# Patient Record
Sex: Male | Born: 1945 | Race: White | Hispanic: No | Smoking: Current every day smoker
Health system: Southern US, Community
[De-identification: ages and names within clinical notes are randomized; demographics above are authoritative.]

## PROBLEM LIST (undated history)

## (undated) DIAGNOSIS — K4090 Unilateral inguinal hernia, without obstruction or gangrene, not specified as recurrent: Secondary | ICD-10-CM

## (undated) DIAGNOSIS — D472 Monoclonal gammopathy: Secondary | ICD-10-CM

## (undated) DIAGNOSIS — I4510 Unspecified right bundle-branch block: Secondary | ICD-10-CM

## (undated) DIAGNOSIS — I779 Disorder of arteries and arterioles, unspecified: Secondary | ICD-10-CM

## (undated) DIAGNOSIS — N400 Enlarged prostate without lower urinary tract symptoms: Secondary | ICD-10-CM

## (undated) DIAGNOSIS — R7303 Prediabetes: Secondary | ICD-10-CM

## (undated) DIAGNOSIS — J9610 Chronic respiratory failure, unspecified whether with hypoxia or hypercapnia: Secondary | ICD-10-CM

## (undated) DIAGNOSIS — I499 Cardiac arrhythmia, unspecified: Secondary | ICD-10-CM

## (undated) DIAGNOSIS — I739 Peripheral vascular disease, unspecified: Secondary | ICD-10-CM

## (undated) DIAGNOSIS — D369 Benign neoplasm, unspecified site: Secondary | ICD-10-CM

## (undated) DIAGNOSIS — I509 Heart failure, unspecified: Secondary | ICD-10-CM

## (undated) DIAGNOSIS — F419 Anxiety disorder, unspecified: Secondary | ICD-10-CM

## (undated) DIAGNOSIS — I10 Essential (primary) hypertension: Secondary | ICD-10-CM

## (undated) DIAGNOSIS — K635 Polyp of colon: Secondary | ICD-10-CM

## (undated) DIAGNOSIS — R0989 Other specified symptoms and signs involving the circulatory and respiratory systems: Secondary | ICD-10-CM

## (undated) DIAGNOSIS — I2510 Atherosclerotic heart disease of native coronary artery without angina pectoris: Secondary | ICD-10-CM

## (undated) DIAGNOSIS — Z7982 Long term (current) use of aspirin: Secondary | ICD-10-CM

## (undated) DIAGNOSIS — Z7901 Long term (current) use of anticoagulants: Secondary | ICD-10-CM

## (undated) DIAGNOSIS — I700 Atherosclerosis of aorta: Secondary | ICD-10-CM

## (undated) DIAGNOSIS — M1990 Unspecified osteoarthritis, unspecified site: Secondary | ICD-10-CM

## (undated) DIAGNOSIS — Q211 Atrial septal defect, unspecified: Secondary | ICD-10-CM

## (undated) DIAGNOSIS — H409 Unspecified glaucoma: Secondary | ICD-10-CM

## (undated) DIAGNOSIS — I5020 Unspecified systolic (congestive) heart failure: Secondary | ICD-10-CM

## (undated) DIAGNOSIS — E785 Hyperlipidemia, unspecified: Secondary | ICD-10-CM

## (undated) DIAGNOSIS — R911 Solitary pulmonary nodule: Secondary | ICD-10-CM

## (undated) DIAGNOSIS — K551 Chronic vascular disorders of intestine: Secondary | ICD-10-CM

## (undated) DIAGNOSIS — C439 Malignant melanoma of skin, unspecified: Secondary | ICD-10-CM

## (undated) DIAGNOSIS — R011 Cardiac murmur, unspecified: Secondary | ICD-10-CM

## (undated) DIAGNOSIS — G8929 Other chronic pain: Secondary | ICD-10-CM

## (undated) DIAGNOSIS — K2950 Unspecified chronic gastritis without bleeding: Secondary | ICD-10-CM

## (undated) DIAGNOSIS — F32A Depression, unspecified: Secondary | ICD-10-CM

## (undated) DIAGNOSIS — Z9981 Dependence on supplemental oxygen: Secondary | ICD-10-CM

## (undated) DIAGNOSIS — M545 Low back pain, unspecified: Secondary | ICD-10-CM

## (undated) DIAGNOSIS — I219 Acute myocardial infarction, unspecified: Secondary | ICD-10-CM

## (undated) DIAGNOSIS — Z720 Tobacco use: Secondary | ICD-10-CM

## (undated) DIAGNOSIS — J449 Chronic obstructive pulmonary disease, unspecified: Secondary | ICD-10-CM

## (undated) DIAGNOSIS — R079 Chest pain, unspecified: Secondary | ICD-10-CM

## (undated) HISTORY — DX: Unspecified glaucoma: H40.9

## (undated) HISTORY — DX: Peripheral vascular disease, unspecified: I73.9

## (undated) HISTORY — DX: Polyp of colon: K63.5

## (undated) HISTORY — DX: Benign prostatic hyperplasia without lower urinary tract symptoms: N40.0

## (undated) HISTORY — PX: COLON SURGERY: SHX602

## (undated) HISTORY — DX: Heart failure, unspecified: I50.9

---

## 1997-12-28 ENCOUNTER — Encounter: Payer: Self-pay | Admitting: Neurosurgery

## 1997-12-28 ENCOUNTER — Ambulatory Visit (HOSPITAL_COMMUNITY): Admission: RE | Admit: 1997-12-28 | Discharge: 1997-12-28 | Payer: Self-pay | Admitting: Neurosurgery

## 1998-01-17 ENCOUNTER — Encounter: Payer: Self-pay | Admitting: Neurosurgery

## 1998-01-19 ENCOUNTER — Inpatient Hospital Stay (HOSPITAL_COMMUNITY): Admission: RE | Admit: 1998-01-19 | Discharge: 1998-01-20 | Payer: Self-pay | Admitting: Neurosurgery

## 1998-01-19 ENCOUNTER — Encounter: Payer: Self-pay | Admitting: Neurosurgery

## 1998-01-19 HISTORY — PX: BACK SURGERY: SHX140

## 1998-03-17 ENCOUNTER — Encounter: Payer: Self-pay | Admitting: Neurosurgery

## 1998-03-17 ENCOUNTER — Ambulatory Visit (HOSPITAL_COMMUNITY): Admission: RE | Admit: 1998-03-17 | Discharge: 1998-03-17 | Payer: Self-pay | Admitting: Neurosurgery

## 1999-04-23 ENCOUNTER — Ambulatory Visit (HOSPITAL_COMMUNITY): Admission: RE | Admit: 1999-04-23 | Discharge: 1999-04-23 | Payer: Self-pay | Admitting: Neurosurgery

## 1999-04-23 ENCOUNTER — Encounter: Payer: Self-pay | Admitting: Neurosurgery

## 2007-07-11 ENCOUNTER — Emergency Department: Payer: Self-pay | Admitting: Emergency Medicine

## 2011-03-26 HISTORY — PX: EYE SURGERY: SHX253

## 2012-08-15 ENCOUNTER — Ambulatory Visit: Payer: Self-pay

## 2012-09-17 ENCOUNTER — Other Ambulatory Visit: Payer: Self-pay | Admitting: Neurosurgery

## 2012-09-24 ENCOUNTER — Encounter (HOSPITAL_COMMUNITY): Payer: Self-pay | Admitting: Pharmacy Technician

## 2012-09-25 ENCOUNTER — Other Ambulatory Visit (HOSPITAL_COMMUNITY): Payer: Self-pay | Admitting: *Deleted

## 2012-09-25 NOTE — Pre-Procedure Instructions (Signed)
DELYNN PURSLEY  09/25/2012   Your procedure is scheduled on:  October 02, 2012 at 10:10 AM  Report to [facility] at 7:10 AM.  Call this number if you have problems the morning of surgery: [phone]   Remember:   Do not eat food or drink liquids after midnight.   Take these medicines the morning of surgery with A SIP OF WATER: ALPRAZolam Prudy Feeler), busPIRone (BUSPAR), HYDROcodone-acetaminophen (NORCO/VICODIN), traMADol (ULTRAM), brimonidine (ALPHAGAN)  Discontinue Aspirin, Coumadin, Plavix, Effient and herbal medication as of today.        Do not wear jewelry, make-up or nail polish.  Do not wear lotions, powders, or perfumes. You may wear deodorant.  Do not shave 48 hours prior to surgery. Men may shave face and neck.  Do not bring valuables to the hospital.  [facility] is not responsible                   for any belongings or valuables.  Contacts, dentures or bridgework may not be worn into surgery.  Leave suitcase in the car. After surgery it may be brought to your room.  For patients admitted to the hospital, checkout time is 11:00 AM the day of  discharge.    Special Instructions: Shower using CHG 2 nights before surgery and the night before surgery.  If you shower the day of surgery use CHG.  Use special wash - you have one bottle of CHG for all showers.  You should use approximately 1/3 of the bottle for each shower.   Please read over the following fact sheets that you were given: Pain Booklet, Coughing and Deep Breathing, MRSA Information and Surgical Site Infection Prevention

## 2012-09-26 ENCOUNTER — Encounter (HOSPITAL_COMMUNITY)
Admission: RE | Admit: 2012-09-26 | Discharge: 2012-09-26 | Disposition: A | Discharge status: Home / Self Care | Payer: Medicare Other | Source: Ambulatory Visit | Attending: Anesthesiology | Admitting: Anesthesiology

## 2012-09-26 ENCOUNTER — Encounter (HOSPITAL_COMMUNITY)
Admission: RE | Admit: 2012-09-26 | Discharge: 2012-09-26 | Disposition: A | Discharge status: Home / Self Care | Payer: Medicare Other | Source: Ambulatory Visit | Attending: Neurosurgery | Admitting: Neurosurgery

## 2012-09-26 ENCOUNTER — Encounter (HOSPITAL_COMMUNITY): Payer: Self-pay

## 2012-09-26 DIAGNOSIS — Z01810 Encounter for preprocedural cardiovascular examination: Secondary | ICD-10-CM | POA: Insufficient documentation

## 2012-09-26 DIAGNOSIS — Z01818 Encounter for other preprocedural examination: Secondary | ICD-10-CM | POA: Insufficient documentation

## 2012-09-26 DIAGNOSIS — Z01812 Encounter for preprocedural laboratory examination: Secondary | ICD-10-CM | POA: Insufficient documentation

## 2012-09-26 HISTORY — DX: Essential (primary) hypertension: I10

## 2012-09-26 HISTORY — DX: Anxiety disorder, unspecified: F41.9

## 2012-09-26 HISTORY — DX: Chronic obstructive pulmonary disease, unspecified: J44.9

## 2012-09-26 HISTORY — DX: Unspecified osteoarthritis, unspecified site: M19.90

## 2012-09-26 LAB — BASIC METABOLIC PANEL
BUN: 10 mg/dL (ref 6–23)
CO2: 25 mEq/L (ref 19–32)
Chloride: 92 mEq/L — ABNORMAL LOW (ref 96–112)
Creatinine, Ser: 0.86 mg/dL (ref 0.50–1.35)
Glucose, Bld: 114 mg/dL — ABNORMAL HIGH (ref 70–99)

## 2012-09-26 LAB — CBC
HCT: 35.8 % — ABNORMAL LOW (ref 39.0–52.0)
MCH: 31.7 pg (ref 26.0–34.0)
MCHC: 36.3 g/dL — ABNORMAL HIGH (ref 30.0–36.0)
MCV: 87.3 fL (ref 78.0–100.0)
RDW: 13.5 % (ref 11.5–15.5)

## 2012-09-26 LAB — SURGICAL PCR SCREEN
MRSA, PCR: NEGATIVE
Staphylococcus aureus: NEGATIVE

## 2012-10-01 MED ORDER — CEFAZOLIN SODIUM-DEXTROSE 2-3 GM-% IV SOLR
2.0000 g | INTRAVENOUS | Status: AC
  Administered 2012-10-02: 2 g via INTRAVENOUS
  Filled 2012-10-01: qty 50

## 2012-10-02 ENCOUNTER — Encounter (HOSPITAL_COMMUNITY): Payer: Self-pay | Admitting: *Deleted

## 2012-10-02 ENCOUNTER — Encounter (HOSPITAL_COMMUNITY)
Admission: RE | Disposition: A | Discharge status: Home / Self Care | Payer: Self-pay | Source: Ambulatory Visit | Attending: Neurosurgery

## 2012-10-02 ENCOUNTER — Observation Stay (HOSPITAL_COMMUNITY): Payer: Medicare Other

## 2012-10-02 ENCOUNTER — Observation Stay (HOSPITAL_COMMUNITY)
Admission: RE | Admit: 2012-10-02 | Discharge: 2012-10-03 | Disposition: A | Discharge status: Home / Self Care | Payer: Medicare Other | Source: Ambulatory Visit | Attending: Neurosurgery | Admitting: Neurosurgery

## 2012-10-02 ENCOUNTER — Ambulatory Visit (HOSPITAL_COMMUNITY): Payer: Medicare Other | Admitting: Certified Registered Nurse Anesthetist

## 2012-10-02 ENCOUNTER — Encounter (HOSPITAL_COMMUNITY): Payer: Self-pay | Admitting: Certified Registered Nurse Anesthetist

## 2012-10-02 DIAGNOSIS — F411 Generalized anxiety disorder: Secondary | ICD-10-CM | POA: Insufficient documentation

## 2012-10-02 DIAGNOSIS — J449 Chronic obstructive pulmonary disease, unspecified: Secondary | ICD-10-CM | POA: Insufficient documentation

## 2012-10-02 DIAGNOSIS — J4489 Other specified chronic obstructive pulmonary disease: Secondary | ICD-10-CM | POA: Insufficient documentation

## 2012-10-02 DIAGNOSIS — M47817 Spondylosis without myelopathy or radiculopathy, lumbosacral region: Secondary | ICD-10-CM | POA: Insufficient documentation

## 2012-10-02 DIAGNOSIS — I10 Essential (primary) hypertension: Secondary | ICD-10-CM | POA: Insufficient documentation

## 2012-10-02 DIAGNOSIS — M5137 Other intervertebral disc degeneration, lumbosacral region: Secondary | ICD-10-CM | POA: Insufficient documentation

## 2012-10-02 DIAGNOSIS — M5126 Other intervertebral disc displacement, lumbar region: Principal | ICD-10-CM | POA: Insufficient documentation

## 2012-10-02 DIAGNOSIS — F17200 Nicotine dependence, unspecified, uncomplicated: Secondary | ICD-10-CM | POA: Insufficient documentation

## 2012-10-02 DIAGNOSIS — M51379 Other intervertebral disc degeneration, lumbosacral region without mention of lumbar back pain or lower extremity pain: Secondary | ICD-10-CM | POA: Insufficient documentation

## 2012-10-02 HISTORY — PX: LUMBAR LAMINECTOMY/DECOMPRESSION MICRODISCECTOMY: SHX5026

## 2012-10-02 SURGERY — LUMBAR LAMINECTOMY/DECOMPRESSION MICRODISCECTOMY 1 LEVEL
Anesthesia: General | Laterality: Right | Wound class: Clean

## 2012-10-02 MED ORDER — PHENOL 1.4 % MT LIQD: 1.0000 | OROMUCOSAL | Status: DC | PRN

## 2012-10-02 MED ORDER — ACETAMINOPHEN 10 MG/ML IV SOLN
INTRAVENOUS | Status: AC
  Administered 2012-10-02: 1000 mg via INTRAVENOUS
  Filled 2012-10-02: qty 100

## 2012-10-02 MED ORDER — SODIUM CHLORIDE 0.9 % IV SOLN: INTRAVENOUS | Status: DC

## 2012-10-02 MED ORDER — ONDANSETRON HCL 4 MG/2ML IJ SOLN: 4.0000 mg | Freq: Four times a day (QID) | INTRAMUSCULAR | Status: DC | PRN

## 2012-10-02 MED ORDER — PROPOFOL 10 MG/ML IV BOLUS
INTRAVENOUS | Status: DC | PRN
  Administered 2012-10-02: 120 mg via INTRAVENOUS

## 2012-10-02 MED ORDER — HYDROMORPHONE HCL PF 1 MG/ML IJ SOLN
0.2500 mg | INTRAMUSCULAR | Status: DC | PRN
  Administered 2012-10-02 (×3): 0.5 mg via INTRAVENOUS

## 2012-10-02 MED ORDER — LACTATED RINGERS IV SOLN
INTRAVENOUS | Status: DC | PRN
  Administered 2012-10-02: 07:00:00 via INTRAVENOUS

## 2012-10-02 MED ORDER — SODIUM CHLORIDE 0.9 % IJ SOLN: 3.0000 mL | INTRAMUSCULAR | Status: DC | PRN

## 2012-10-02 MED ORDER — TAMSULOSIN HCL 0.4 MG PO CAPS
0.8000 mg | ORAL_CAPSULE | ORAL | Status: AC
  Administered 2012-10-02: 0.8 mg via ORAL
  Filled 2012-10-02: qty 2

## 2012-10-02 MED ORDER — MIDAZOLAM HCL 5 MG/5ML IJ SOLN
INTRAMUSCULAR | Status: DC | PRN
  Administered 2012-10-02: 2 mg via INTRAVENOUS

## 2012-10-02 MED ORDER — ARTIFICIAL TEARS OP OINT
TOPICAL_OINTMENT | OPHTHALMIC | Status: DC | PRN
  Administered 2012-10-02: 1 via OPHTHALMIC

## 2012-10-02 MED ORDER — KETOROLAC TROMETHAMINE 30 MG/ML IJ SOLN
30.0000 mg | Freq: Four times a day (QID) | INTRAMUSCULAR | Status: DC
  Administered 2012-10-02 – 2012-10-03 (×3): 30 mg via INTRAVENOUS
  Filled 2012-10-02 (×7): qty 1

## 2012-10-02 MED ORDER — PHENYLEPHRINE HCL 10 MG/ML IJ SOLN
INTRAMUSCULAR | Status: DC | PRN
  Administered 2012-10-02: 40 ug via INTRAVENOUS
  Administered 2012-10-02 (×2): 80 ug via INTRAVENOUS

## 2012-10-02 MED ORDER — DEXAMETHASONE SODIUM PHOSPHATE 10 MG/ML IJ SOLN
INTRAMUSCULAR | Status: AC
  Filled 2012-10-02: qty 1

## 2012-10-02 MED ORDER — ONDANSETRON HCL 4 MG/2ML IJ SOLN
INTRAMUSCULAR | Status: DC | PRN
  Administered 2012-10-02: 4 mg via INTRAVENOUS

## 2012-10-02 MED ORDER — SODIUM CHLORIDE 0.9 % IR SOLN
Status: DC | PRN
  Administered 2012-10-02: 10:00:00

## 2012-10-02 MED ORDER — KETOROLAC TROMETHAMINE 30 MG/ML IJ SOLN
30.0000 mg | Freq: Once | INTRAMUSCULAR | Status: DC
  Administered 2012-10-02: 30 mg via INTRAVENOUS

## 2012-10-02 MED ORDER — LACTATED RINGERS IV SOLN
INTRAVENOUS | Status: DC
  Administered 2012-10-02: 07:00:00 via INTRAVENOUS

## 2012-10-02 MED ORDER — HYDROXYZINE HCL 25 MG PO TABS: 50.0000 mg | ORAL_TABLET | ORAL | Status: DC | PRN

## 2012-10-02 MED ORDER — FENTANYL CITRATE 0.05 MG/ML IJ SOLN
INTRAMUSCULAR | Status: DC | PRN
  Administered 2012-10-02: 100 ug via INTRAVENOUS

## 2012-10-02 MED ORDER — BUSPIRONE HCL 15 MG PO TABS
30.0000 mg | ORAL_TABLET | Freq: Two times a day (BID) | ORAL | Status: DC
  Administered 2012-10-02: 30 mg via ORAL
  Filled 2012-10-02 (×3): qty 2

## 2012-10-02 MED ORDER — GLYCOPYRROLATE 0.2 MG/ML IJ SOLN
INTRAMUSCULAR | Status: DC | PRN
  Administered 2012-10-02: .8 mg via INTRAVENOUS

## 2012-10-02 MED ORDER — 0.9 % SODIUM CHLORIDE (POUR BTL) OPTIME
TOPICAL | Status: DC | PRN
  Administered 2012-10-02: 1000 mL

## 2012-10-02 MED ORDER — BUPIVACAINE HCL (PF) 0.5 % IJ SOLN
INTRAMUSCULAR | Status: DC | PRN
  Administered 2012-10-02: 5 mL

## 2012-10-02 MED ORDER — EPHEDRINE SULFATE 50 MG/ML IJ SOLN
INTRAMUSCULAR | Status: DC | PRN
  Administered 2012-10-02: 2.5 mg via INTRAVENOUS
  Administered 2012-10-02: 5 mg via INTRAVENOUS

## 2012-10-02 MED ORDER — DEXAMETHASONE SODIUM PHOSPHATE 10 MG/ML IJ SOLN
INTRAMUSCULAR | Status: DC | PRN
  Administered 2012-10-02: 10 mg via INTRAVENOUS

## 2012-10-02 MED ORDER — SODIUM CHLORIDE 0.9 % IJ SOLN
3.0000 mL | Freq: Two times a day (BID) | INTRAMUSCULAR | Status: DC
  Administered 2012-10-02: 3 mL via INTRAVENOUS

## 2012-10-02 MED ORDER — FENTANYL CITRATE 0.05 MG/ML IJ SOLN
INTRAMUSCULAR | Status: AC
  Filled 2012-10-02: qty 2

## 2012-10-02 MED ORDER — ALUM & MAG HYDROXIDE-SIMETH 200-200-20 MG/5ML PO SUSP: 30.0000 mL | Freq: Four times a day (QID) | ORAL | Status: DC | PRN

## 2012-10-02 MED ORDER — LIDOCAINE-EPINEPHRINE 1 %-1:100000 IJ SOLN
INTRAMUSCULAR | Status: DC | PRN
  Administered 2012-10-02: 5 mL via INTRADERMAL

## 2012-10-02 MED ORDER — LIDOCAINE HCL (CARDIAC) 20 MG/ML IV SOLN
INTRAVENOUS | Status: DC | PRN
  Administered 2012-10-02: 65 mg via INTRAVENOUS

## 2012-10-02 MED ORDER — FENTANYL CITRATE 0.05 MG/ML IJ SOLN
INTRAMUSCULAR | Status: DC | PRN
  Administered 2012-10-02: 150 ug via INTRAVENOUS

## 2012-10-02 MED ORDER — MORPHINE SULFATE 4 MG/ML IJ SOLN
4.0000 mg | INTRAMUSCULAR | Status: DC | PRN
  Administered 2012-10-02 (×2): 4 mg via INTRAMUSCULAR
  Filled 2012-10-02 (×2): qty 1

## 2012-10-02 MED ORDER — OXYCODONE HCL 5 MG PO TABS
ORAL_TABLET | ORAL | Status: AC
  Filled 2012-10-02: qty 1

## 2012-10-02 MED ORDER — BUSPIRONE HCL 15 MG PO TABS: 30.0000 mg | ORAL_TABLET | Freq: Two times a day (BID) | ORAL | Status: DC

## 2012-10-02 MED ORDER — ONDANSETRON HCL 4 MG/2ML IJ SOLN: 4.0000 mg | Freq: Once | INTRAMUSCULAR | Status: DC | PRN

## 2012-10-02 MED ORDER — HYDROMORPHONE HCL PF 1 MG/ML IJ SOLN
INTRAMUSCULAR | Status: AC
  Administered 2012-10-02: 0.5 mg via INTRAVENOUS
  Filled 2012-10-02: qty 1

## 2012-10-02 MED ORDER — METHYLPREDNISOLONE ACETATE 80 MG/ML IJ SUSP
INTRAMUSCULAR | Status: DC | PRN
  Administered 2012-10-02: 80 mg via INTRALESIONAL

## 2012-10-02 MED ORDER — BISACODYL 10 MG RE SUPP: 10.0000 mg | Freq: Every day | RECTAL | Status: DC | PRN

## 2012-10-02 MED ORDER — ALPRAZOLAM 0.5 MG PO TABS
1.0000 mg | ORAL_TABLET | Freq: Three times a day (TID) | ORAL | Status: DC | PRN
  Administered 2012-10-02 – 2012-10-03 (×2): 1 mg via ORAL
  Filled 2012-10-02 (×2): qty 2

## 2012-10-02 MED ORDER — OXYCODONE HCL 5 MG/5ML PO SOLN: 5.0000 mg | Freq: Once | ORAL | Status: AC | PRN

## 2012-10-02 MED ORDER — KETOROLAC TROMETHAMINE 30 MG/ML IJ SOLN
INTRAMUSCULAR | Status: AC
  Administered 2012-10-02: 30 mg via INTRAVENOUS
  Filled 2012-10-02: qty 1

## 2012-10-02 MED ORDER — MAGNESIUM HYDROXIDE 400 MG/5ML PO SUSP: 30.0000 mL | Freq: Every day | ORAL | Status: DC | PRN

## 2012-10-02 MED ORDER — NEOSTIGMINE METHYLSULFATE 1 MG/ML IJ SOLN
INTRAMUSCULAR | Status: DC | PRN
  Administered 2012-10-02: 5 mg via INTRAVENOUS

## 2012-10-02 MED ORDER — LATANOPROST 0.005 % OP SOLN
1.0000 [drp] | Freq: Every day | OPHTHALMIC | Status: DC
  Administered 2012-10-03: 1 [drp] via OPHTHALMIC
  Filled 2012-10-02 (×2): qty 2.5

## 2012-10-02 MED ORDER — ALPRAZOLAM 1 MG PO TABS: 1.0000 mg | ORAL_TABLET | Freq: Three times a day (TID) | ORAL | Status: DC | PRN

## 2012-10-02 MED ORDER — BRIMONIDINE TARTRATE 0.2 % OP SOLN
1.0000 [drp] | Freq: Two times a day (BID) | OPHTHALMIC | Status: DC
  Administered 2012-10-02: 1 [drp] via OPHTHALMIC
  Filled 2012-10-02: qty 5

## 2012-10-02 MED ORDER — CYCLOBENZAPRINE HCL 10 MG PO TABS
10.0000 mg | ORAL_TABLET | Freq: Three times a day (TID) | ORAL | Status: DC | PRN
  Administered 2012-10-02: 10 mg via ORAL
  Filled 2012-10-02: qty 1

## 2012-10-02 MED ORDER — SODIUM CHLORIDE 0.9 % IV SOLN: 250.0000 mL | INTRAVENOUS | Status: DC

## 2012-10-02 MED ORDER — MENTHOL 3 MG MT LOZG: 1.0000 | LOZENGE | OROMUCOSAL | Status: DC | PRN

## 2012-10-02 MED ORDER — ACETAMINOPHEN 325 MG PO TABS: 650.0000 mg | ORAL_TABLET | ORAL | Status: DC | PRN

## 2012-10-02 MED ORDER — HYDROCODONE-ACETAMINOPHEN 5-325 MG PO TABS: 1.0000 | ORAL_TABLET | ORAL | Status: DC | PRN

## 2012-10-02 MED ORDER — ROCURONIUM BROMIDE 100 MG/10ML IV SOLN
INTRAVENOUS | Status: DC | PRN
  Administered 2012-10-02: 50 mg via INTRAVENOUS
  Administered 2012-10-02: 5 mg via INTRAVENOUS

## 2012-10-02 MED ORDER — THROMBIN 5000 UNITS EX SOLR
CUTANEOUS | Status: DC | PRN
  Administered 2012-10-02 (×2): 5000 [IU] via TOPICAL

## 2012-10-02 MED ORDER — HYDROMORPHONE HCL PF 1 MG/ML IJ SOLN
INTRAMUSCULAR | Status: AC
  Filled 2012-10-02: qty 1

## 2012-10-02 MED ORDER — OXYCODONE HCL 5 MG PO TABS
5.0000 mg | ORAL_TABLET | Freq: Once | ORAL | Status: AC | PRN
  Administered 2012-10-02: 5 mg via ORAL

## 2012-10-02 MED ORDER — MEPERIDINE HCL 25 MG/ML IJ SOLN: 6.2500 mg | INTRAMUSCULAR | Status: DC | PRN

## 2012-10-02 MED ORDER — OXYCODONE-ACETAMINOPHEN 5-325 MG PO TABS
1.0000 | ORAL_TABLET | ORAL | Status: DC | PRN
  Administered 2012-10-02 – 2012-10-03 (×2): 2 via ORAL
  Filled 2012-10-02 (×2): qty 2

## 2012-10-02 MED ORDER — BRIMONIDINE TARTRATE 0.15 % OP SOLN
1.0000 [drp] | Freq: Two times a day (BID) | OPHTHALMIC | Status: DC
  Filled 2012-10-02: qty 5

## 2012-10-02 MED ORDER — ACETAMINOPHEN 650 MG RE SUPP: 650.0000 mg | RECTAL | Status: DC | PRN

## 2012-10-02 MED ORDER — TAMSULOSIN HCL 0.4 MG PO CAPS
0.4000 mg | ORAL_CAPSULE | Freq: Every day | ORAL | Status: DC
  Administered 2012-10-03: 0.4 mg via ORAL
  Filled 2012-10-02 (×2): qty 1

## 2012-10-02 SURGICAL SUPPLY — 67 items
ADH SKN CLS APL DERMABOND .7 (GAUZE/BANDAGES/DRESSINGS) ×1
APL SKNCLS STERI-STRIP NONHPOA (GAUZE/BANDAGES/DRESSINGS)
BAG DECANTER FOR FLEXI CONT (MISCELLANEOUS) ×2 IMPLANT
BENZOIN TINCTURE PRP APPL 2/3 (GAUZE/BANDAGES/DRESSINGS) IMPLANT
BLADE SURG ROTATE 9660 (MISCELLANEOUS) IMPLANT
BRUSH SCRUB EZ PLAIN DRY (MISCELLANEOUS) ×2 IMPLANT
BUR ACORN 6.0 ACORN (BURR) IMPLANT
BUR ACRON 5.0MM COATED (BURR) ×1 IMPLANT
BUR MATCHSTICK NEURO 3.0 LAGG (BURR) ×2 IMPLANT
CANISTER SUCTION 2500CC (MISCELLANEOUS) ×2 IMPLANT
CLOTH BEACON ORANGE TIMEOUT ST (SAFETY) ×2 IMPLANT
CONT SPEC 4OZ CLIKSEAL STRL BL (MISCELLANEOUS) ×1 IMPLANT
DERMABOND ADVANCED (GAUZE/BANDAGES/DRESSINGS) ×1
DERMABOND ADVANCED .7 DNX12 (GAUZE/BANDAGES/DRESSINGS) IMPLANT
DRAPE LAPAROTOMY 100X72X124 (DRAPES) ×2 IMPLANT
DRAPE MICROSCOPE LEICA (MISCELLANEOUS) ×2 IMPLANT
DRAPE POUCH INSTRU U-SHP 10X18 (DRAPES) ×2 IMPLANT
DRAPE PROXIMA HALF (DRAPES) ×1 IMPLANT
DRSG EMULSION OIL 3X3 NADH (GAUZE/BANDAGES/DRESSINGS) IMPLANT
ELECT REM PT RETURN 9FT ADLT (ELECTROSURGICAL) ×2
ELECTRODE REM PT RTRN 9FT ADLT (ELECTROSURGICAL) ×1 IMPLANT
GAUZE SPONGE 4X4 16PLY XRAY LF (GAUZE/BANDAGES/DRESSINGS) IMPLANT
GLOVE BIO SURGEON STRL SZ8 (GLOVE) ×1 IMPLANT
GLOVE BIOGEL PI IND STRL 7.5 (GLOVE) IMPLANT
GLOVE BIOGEL PI IND STRL 8 (GLOVE) ×1 IMPLANT
GLOVE BIOGEL PI IND STRL 8.5 (GLOVE) IMPLANT
GLOVE BIOGEL PI INDICATOR 7.5 (GLOVE) ×1
GLOVE BIOGEL PI INDICATOR 8 (GLOVE) ×3
GLOVE BIOGEL PI INDICATOR 8.5 (GLOVE) ×1
GLOVE ECLIPSE 7.5 STRL STRAW (GLOVE) ×2 IMPLANT
GLOVE ECLIPSE 8.0 STRL XLNG CF (GLOVE) ×1 IMPLANT
GLOVE EXAM NITRILE LRG STRL (GLOVE) IMPLANT
GLOVE EXAM NITRILE MD LF STRL (GLOVE) IMPLANT
GLOVE EXAM NITRILE XL STR (GLOVE) IMPLANT
GLOVE EXAM NITRILE XS STR PU (GLOVE) IMPLANT
GLOVE SURG SS PI 7.0 STRL IVOR (GLOVE) ×2 IMPLANT
GOWN BRE IMP SLV AUR LG STRL (GOWN DISPOSABLE) ×2 IMPLANT
GOWN BRE IMP SLV AUR XL STRL (GOWN DISPOSABLE) ×2 IMPLANT
GOWN STRL REIN 2XL LVL4 (GOWN DISPOSABLE) ×1 IMPLANT
KIT BASIN OR (CUSTOM PROCEDURE TRAY) ×2 IMPLANT
KIT ROOM TURNOVER OR (KITS) ×2 IMPLANT
NDL HYPO 18GX1.5 BLUNT FILL (NEEDLE) IMPLANT
NDL SPNL 18GX3.5 QUINCKE PK (NEEDLE) ×1 IMPLANT
NDL SPNL 22GX3.5 QUINCKE BK (NEEDLE) ×1 IMPLANT
NEEDLE HYPO 18GX1.5 BLUNT FILL (NEEDLE) IMPLANT
NEEDLE SPNL 18GX3.5 QUINCKE PK (NEEDLE) ×2 IMPLANT
NEEDLE SPNL 22GX3.5 QUINCKE BK (NEEDLE) ×2 IMPLANT
NS IRRIG 1000ML POUR BTL (IV SOLUTION) ×2 IMPLANT
PACK LAMINECTOMY NEURO (CUSTOM PROCEDURE TRAY) ×2 IMPLANT
PAD ARMBOARD 7.5X6 YLW CONV (MISCELLANEOUS) ×6 IMPLANT
PATTIES SURGICAL .5 X1 (DISPOSABLE) ×2 IMPLANT
PENCIL BUTTON HOLSTER BLD 10FT (ELECTRODE) ×1 IMPLANT
RUBBERBAND STERILE (MISCELLANEOUS) ×4 IMPLANT
SPONGE GAUZE 4X4 12PLY (GAUZE/BANDAGES/DRESSINGS) IMPLANT
SPONGE LAP 4X18 X RAY DECT (DISPOSABLE) IMPLANT
SPONGE SURGIFOAM ABS GEL SZ50 (HEMOSTASIS) ×2 IMPLANT
STRIP CLOSURE SKIN 1/2X4 (GAUZE/BANDAGES/DRESSINGS) IMPLANT
SUT PROLENE 6 0 BV (SUTURE) IMPLANT
SUT VIC AB 1 CT1 18XBRD ANBCTR (SUTURE) ×1 IMPLANT
SUT VIC AB 1 CT1 8-18 (SUTURE) ×2
SUT VIC AB 2-0 CP2 18 (SUTURE) ×2 IMPLANT
SUT VIC AB 3-0 SH 8-18 (SUTURE) ×1 IMPLANT
SYR 20ML ECCENTRIC (SYRINGE) ×2 IMPLANT
SYR 5ML LL (SYRINGE) IMPLANT
TOWEL OR 17X24 6PK STRL BLUE (TOWEL DISPOSABLE) ×2 IMPLANT
TOWEL OR 17X26 10 PK STRL BLUE (TOWEL DISPOSABLE) ×2 IMPLANT
WATER STERILE IRR 1000ML POUR (IV SOLUTION) ×2 IMPLANT

## 2012-10-02 NOTE — Progress Notes (Signed)
Filed Vitals:   10/02/12 1230 10/02/12 1245 10/02/12 1300 10/02/12 1642  BP: 152/74 143/76 160/79 134/78  Pulse: 68 65 67 67  Temp:  97.7 F (36.5 C) 97.5 F (36.4 C)   TempSrc:      Resp: 16 15 16 16   SpO2: 96% 93% 95% 93%    Patient resting in bed, has been up and ambulating in the halls. Good relief of right lumbar radicular pain. Wound clean and dry. Patient unable to void, bladder scan showed greater than 1000 cc. Nursing staff attempt to place a Foley catheter, they were unsuccessful. I placed a 15F Coude catheter, and over 1700 cc drained. Will leave catheter to straight drainage. We'll start Flomax, giving 0.8 mg by mouth now, and then begin 0.4 mg every morning thereafter.  Plan: We'll leave Foley in for now. I suspect patient has had difficulties with urinary retention and overflow prior to hospitalization and surgery. Anticipate urology followup next week. Encouraged continued ambulation.  Hewitt Shorts, MD 10/02/2012, 7:04 PM

## 2012-10-02 NOTE — Op Note (Signed)
10/02/2012  11:31 AM  PATIENT:  Todd Whitehead  67 y.o. male  PRE-OPERATIVE DIAGNOSIS:  Recurrent right L5-S1 lumbar Herniated disc, lumbar degenerative disc disease, lumbar spondylolsis, lumbar radiculopathy  POST-OPERATIVE DIAGNOSIS: Recurrent right L5-S1 lumbar Herniated disc, lumbar degenerative disc disease, lumbar spondylolsis, lumbar radiculopathy  PROCEDURE:  Procedure(s): Right Lumbar Five-Sacral One Lumbar laminotomy/Microdiskectomy with microdissection, microsurgical technique, and the operating microscope  SURGEON:  Surgeon(s): Hewitt Shorts, MD  ASSISTANTS: Maeola Harman, M.D.  ANESTHESIA:   general  EBL:    50 cc  BLOOD ADMINISTERED:none  COUNT: Correct per nursing staff  DICTATION: Patient was brought to the operating room and placed under general endotracheal anesthesia. Patient was turned to prone position the lumbar region was prepped with Betadine soap and solution and draped in a sterile fashion. The midline was infiltrated with local anesthetic with epinephrine. A localizing x-ray was taken and the L5-S1 level was identified. Midline incision was made over the L5-S1 level and was carried down through the subcutaneous tissue to the lumbar fascia. The lumbar fascia was incised on the right side and the paraspinal muscles were dissected from the spinous processes and lamina in a subperiosteal fashion. Another x-ray was taken and the L5-S1 intralaminar space was identified. The operating microscope was draped and brought into the field provided additional magnification, illumination, and visualization. Laminotomy was performed using the high-speed drill and Kerrison punches. The ligamentum flavum was carefully resected. The underlying thecal sac and nerve root were identified. The disc herniation was identified and the thecal sac and nerve root gently retracted medially. There was moderate scar tissue in a surgical plane was carefully dissected with much of technique. There  is hypertrophic facet arthropathy there is carefully removed via right L5-S1 medial facetectomy. Foraminotomies 4 for the exiting right S1 nerve root. Spondylitic overgrowth of the posterior aspect of the L5 and S1 vertebra were removed. The exposed disc herniation was incised and removed, and a thorough discectomy was performed with removal all loose fragments disc material from both the space and epidural space and good decompression of the thecal sac and exiting right S1 nerve was achieved. Once the discectomy was completed and good decompression of the thecal sac and nerve had been achieved hemostasis was established with the use of bipolar cautery and Gelfoam with thrombin. The Gelfoam was removed and hemostasis confirmed. We then instilled 2 cc of fentanyl and 80 mg of Depo-Medrol into the epidural space. Deep fascia was closed with interrupted undyed 1 Vicryl sutures. Scarpa's fascia was closed with interrupted undyed 1 Vicryl sutures in the subcutaneous and subcuticular layer were closed with interrupted inverted 2-0 undyed Vicryl sutures. The skin edges were approximated with Dermabond. Following surgery the patient was turned back to a supine position to be reversed from the anesthetic extubated and transferred to the recovery room for further care.   PLAN OF CARE: Admit for overnight observation  PATIENT DISPOSITION:  PACU - hemodynamically stable.   Delay start of Pharmacological VTE agent (>24hrs) due to surgical blood loss or risk of bleeding:  yes

## 2012-10-02 NOTE — H&P (Signed)
Subjective: Patient is a 67 y.o. male who is admitted for treatment of recurrent right L5-S1 lumbar disc herniation with resulting right lumbar radiculopathy. Patient had previous right L5-S1 lumbar laminotomy microdiscectomy in [phone]. Patient admitted now for a right L5-S1 lumbar laminotomy microdiscectomy.   Past Medical History  Diagnosis Date  . Anxiety   . COPD (chronic obstructive pulmonary disease)     smoker  . Arthritis   . Hypertension     on  no meds    Past Surgical History  Procedure Laterality Date  . Eye surgery Left 03/26/11  . Back surgery  01/19/98  . Colon surgery  age 16 days old    " bowel was twisted up"    Prescriptions prior to admission  Medication Sig Dispense Refill  . ALPRAZolam (XANAX) 1 MG tablet Take 1 mg by mouth 3 (three) times daily as needed for sleep.      . brimonidine (ALPHAGAN) 0.15 % ophthalmic solution Place 1 drop into both eyes 2 (two) times daily.      . busPIRone (BUSPAR) 30 MG tablet Take 30 mg by mouth 2 (two) times daily.      Marland Kitchen HYDROcodone-acetaminophen (NORCO/VICODIN) 5-325 MG per tablet Take 1 tablet by mouth every 4 (four) hours as needed for pain.      Marland Kitchen latanoprost (XALATAN) 0.005 % ophthalmic solution Place 1 drop into both eyes at bedtime.      . naproxen sodium (ANAPROX) 220 MG tablet Take 440 mg by mouth as needed (for pain.).      Marland Kitchen traMADol (ULTRAM) 50 MG tablet Take 50 mg by mouth 2 (two) times daily.       Allergies  Allergen Reactions  . Other     Cheese,butter,sour cream,cottage cheese    History  Substance Use Topics  . Smoking status: Current Every Day Smoker -- 2.00 packs/day for 50 years    Types: Cigarettes  . Smokeless tobacco: Never Used  . Alcohol Use: No    History reviewed. No pertinent family history.   Review of Systems A comprehensive review of systems was negative.  Objective: Vital signs in last 24 hours: Temp:  [96.8 F (36 C)] 96.8 F (36 C) (09/04 8119) Pulse Rate:  [66] 66  (09/04 0652) Resp:  [18] 18 (09/04 0652) BP: (148)/(67) 148/67 mmHg (09/04 0652) SpO2:  [97 %] 97 % (09/04 0652)  EXAM: Patient thin white male in no acute distress. Lungs are clear to auscultation , the patient has symmetrical respiratory excursion. Heart has a regular rate and rhythm normal S1 and S2 no murmur.   Abdomen is soft nontender nondistended bowel sounds are present. Extremity examination shows no clubbing cyanosis or edema. Musculoskeletal examination shows mild diffuse tenderness to palpation in the lumbar region. He is able to flex to about 70-80. He is able to extend only to 0-5. Straight leg raising is negative bilaterally. Motor examination shows 5 over 5 strength in the lower extremities including the iliopsoas quadriceps dorsiflexor extensor hallicus  longus and plantar flexor bilaterally. Sensation is intact to pinprick in the distal lower extremities. Reflexes are symmetrical at the quadriceps, the left quadriceps is one, the right quadriceps is absent.. No pathologic reflexes are present. Patient has a normal gait and stance.   Data Review:CBC    Component Value Date/Time   WBC 9.4 09/26/2012 0851   RBC 4.10* 09/26/2012 0851   HGB 13.0 09/26/2012 0851   HCT 35.8* 09/26/2012 0851   PLT 237 09/26/2012 0851  MCV 87.3 09/26/2012 0851   MCH 31.7 09/26/2012 0851   MCHC 36.3* 09/26/2012 0851   RDW 13.5 09/26/2012 0851                          BMET    Component Value Date/Time   NA 127* 09/26/2012 0851   K 4.6 09/26/2012 0851   CL 92* 09/26/2012 0851   CO2 25 09/26/2012 0851   GLUCOSE 114* 09/26/2012 0851   BUN 10 09/26/2012 0851   CREATININE 0.86 09/26/2012 0851   CALCIUM 9.3 09/26/2012 0851   GFRNONAA 88* 09/26/2012 0851   GFRAA >90 09/26/2012 0851     Assessment/Plan: Patient with recurrent right lumbar radiculopathy secondary to recurrent right L5-S1 lumbar disc herniation. Patient admitted now for a right L5-S1 lumbar laminotomy and microdiscectomy.  I've discussed with  the patient the nature of his condition, the nature the surgical procedure, the typical length of surgery, hospital stay, and overall recuperation. We discussed limitations postoperatively. I discussed risks of surgery including risks of infection, bleeding, possibly need for transfusion, the risk of nerve root dysfunction with pain, weakness, numbness, or paresthesias, or risk of dural tear and CSF leakage and possible need for further surgery, the risk of recurrent disc herniation and the possible need for further surgery, and the risk of anesthetic complications including myocardial infarction, stroke, pneumonia, and death. Understanding all this the patient does wish to proceed with surgery and is admitted for such.    Hewitt Shorts, MD 10/02/2012 7:29 AM

## 2012-10-02 NOTE — Anesthesia Postprocedure Evaluation (Signed)
Anesthesia Post Note  Patient: Todd Whitehead  Procedure(s) Performed: Procedure(s) (LRB): Right Lumbar Five-Sacral One Lumbar laminotomy/Microdiskectomy (Right)  Anesthesia type: general  Patient location: PACU  Post pain: Pain level controlled  Post assessment: Patient's Cardiovascular Status Stable  Last Vitals:  Filed Vitals:   10/02/12 1245  BP: 143/76  Pulse: 65  Temp: 36.5 C  Resp: 15    Post vital signs: Reviewed and stable  Level of consciousness: sedated  Complications: No apparent anesthesia complications

## 2012-10-02 NOTE — Progress Notes (Signed)
Dr.Ossy notified of NA 127,no further action needed.

## 2012-10-02 NOTE — Preoperative (Signed)
Beta Blockers   Reason not to administer Beta Blockers:Not Applicable 

## 2012-10-02 NOTE — Transfer of Care (Signed)
Immediate Anesthesia Transfer of Care Note  Patient: Todd Whitehead  Procedure(s) Performed: Procedure(s) with comments: Right Lumbar Five-Sacral One Lumbar laminotomy/Microdiskectomy (Right) - Right Lumbar Five-Sacral One Lumbar laminotomy/Microdiskectomy  Patient Location: PACU  Anesthesia Type:General  Level of Consciousness: awake, alert  and patient cooperative  Airway & Oxygen Therapy: Patient Spontanous Breathing  Post-op Assessment: Report given to PACU RN, Post -op Vital signs reviewed and stable and Patient moving all extremities X 4  Post vital signs: Reviewed and stable  Complications: No apparent anesthesia complications

## 2012-10-02 NOTE — Anesthesia Preprocedure Evaluation (Signed)
Anesthesia Evaluation  Patient identified by MRN, date of birth, ID band Patient awake    Reviewed: Allergy & Precautions, H&P , NPO status , Patient's Chart, lab work & pertinent test results  Airway Mallampati: I TM Distance: >3 FB Neck ROM: Full    Dental   Pulmonary COPD         Cardiovascular hypertension, Pt. on medications     Neuro/Psych    GI/Hepatic   Endo/Other    Renal/GU      Musculoskeletal   Abdominal   Peds  Hematology   Anesthesia Other Findings   Reproductive/Obstetrics                           Anesthesia Physical Anesthesia Plan  ASA: II  Anesthesia Plan: General   Post-op Pain Management:    Induction: Intravenous  Airway Management Planned: Oral ETT  Additional Equipment:   Intra-op Plan:   Post-operative Plan: Extubation in OR  Informed Consent: I have reviewed the patients History and Physical, chart, labs and discussed the procedure including the risks, benefits and alternatives for the proposed anesthesia with the patient or authorized representative who has indicated his/her understanding and acceptance.     Plan Discussed with: CRNA and Surgeon  Anesthesia Plan Comments:         Anesthesia Quick Evaluation

## 2012-10-02 NOTE — Progress Notes (Signed)
UR COMPLETED  

## 2012-10-03 ENCOUNTER — Encounter (HOSPITAL_COMMUNITY): Payer: Self-pay | Admitting: Neurosurgery

## 2012-10-03 MED ORDER — OXYCODONE-ACETAMINOPHEN 5-325 MG PO TABS: 1.0000 | ORAL_TABLET | ORAL | Status: DC | PRN

## 2012-10-03 MED ORDER — TAMSULOSIN HCL 0.4 MG PO CAPS: 0.4000 mg | ORAL_CAPSULE | Freq: Every day | ORAL | Status: DC

## 2012-10-03 NOTE — Discharge Summary (Signed)
Physician Discharge Summary  Patient ID: Todd Whitehead MRN: [id number] DOB/AGE: Dec 27, 1945 67 y.o.  Admit date: 10/02/2012 Discharge date: 10/03/2012  Admission Diagnoses:  Recurrent right L5-S1 lumbar Herniated disc, lumbar degenerative disc disease, lumbar spondylolsis, lumbar radiculopathy  Discharge Diagnoses:  Recurrent right L5-S1 lumbar Herniated disc, lumbar degenerative disc disease, lumbar spondylolsis, lumbar radiculopathy, urinary retention  Discharged Condition: good  Hospital Course: Patient was admitted underwent a right L5-S1 lumbar laminotomy and microdiscectomy for recurrent right L5-S1 lumbar disc herniation (previous surgery was in 1999). Following surgery the patient has had excellent relief of his radicular pain. His wound is healing well. However he was unable to void, the nursing staff had difficulty placing a Foley catheter, and therefore I placed a 16 French coud catheter, and over 1700 cc of urine drained. A Foley was left to straight drainage. The patient was started on Flomax after the catheter was placed. I consulted Dr. Bjorn Pippin on the day of discharge by phone. He recommended continuing the Flomax and have the patient followup with him in the office in 1-2 weeks. He said that his office would call the patient to make the arrangements for the followup appointment. The patient is asking to be discharged to home. The Foley catheter will be set up with a leg bag. Patient's been instructed regarding wound care and activities following discharge. He is to return for followup with me in 3 weeks, and to see Dr. Annabell Howells within the next 1-2 weeks.  Consults: urology - Dr. Bjorn Pippin, [facility]  Discharge Exam: Blood pressure 171/74, pulse 92, temperature 98.4 F (36.9 C), temperature source Oral, resp. rate 18, SpO2 93.00%.  Disposition: Home     Medication List         ALPRAZolam 1 MG tablet  Commonly known as:  XANAX  Take 1 mg by mouth 3 (three) times daily as  needed for sleep.     brimonidine 0.15 % ophthalmic solution  Commonly known as:  ALPHAGAN  Place 1 drop into both eyes 2 (two) times daily.     busPIRone 30 MG tablet  Commonly known as:  BUSPAR  Take 30 mg by mouth 2 (two) times daily.     HYDROcodone-acetaminophen 5-325 MG per tablet  Commonly known as:  NORCO/VICODIN  Take 1 tablet by mouth every 4 (four) hours as needed for pain.     latanoprost 0.005 % ophthalmic solution  Commonly known as:  XALATAN  Place 1 drop into both eyes at bedtime.     naproxen sodium 220 MG tablet  Commonly known as:  ANAPROX  Take 440 mg by mouth as needed (for pain.).     oxyCODONE-acetaminophen 5-325 MG per tablet  Commonly known as:  PERCOCET/ROXICET  Take 1-2 tablets by mouth every 4 (four) hours as needed.     tamsulosin 0.4 MG Caps capsule  Commonly known as:  FLOMAX  Take 1 capsule (0.4 mg total) by mouth daily after breakfast.     traMADol 50 MG tablet  Commonly known as:  ULTRAM  Take 50 mg by mouth 2 (two) times daily.           Follow-up Information   Follow up with Anner Crete, MD In 1 week. (Dr. Belva Crome office to call and setup appointment.)    Specialty:  Urology   Contact information:   [address] [place] [address] [phone]       Follow up with Hewitt Shorts, MD In 3 weeks. (  office to notify patient of appointment)    Specialty:  Neurosurgery   Contact information:   [address] [place] [address] [phone]       Signed: Hewitt Shorts, MD 10/03/2012, 9:50 AM

## 2012-10-03 NOTE — Progress Notes (Signed)
Pt given D/C instructions with Rx's, verbal understanding given. Pt taught how to care for Foley @ home and D/C'd home with leg bag per MD order. Pt D/C'd home via wheelchair @ 1105 per MD order. Rema Fendt, RN

## 2015-01-30 DIAGNOSIS — I219 Acute myocardial infarction, unspecified: Secondary | ICD-10-CM

## 2015-01-30 HISTORY — PX: CORONARY ARTERY BYPASS GRAFT: SHX141

## 2015-01-30 HISTORY — DX: Acute myocardial infarction, unspecified: I21.9

## 2015-03-21 DIAGNOSIS — F325 Major depressive disorder, single episode, in full remission: Secondary | ICD-10-CM | POA: Insufficient documentation

## 2015-03-21 DIAGNOSIS — G8929 Other chronic pain: Secondary | ICD-10-CM | POA: Insufficient documentation

## 2015-03-21 DIAGNOSIS — M545 Low back pain, unspecified: Secondary | ICD-10-CM | POA: Insufficient documentation

## 2015-03-21 DIAGNOSIS — N401 Enlarged prostate with lower urinary tract symptoms: Secondary | ICD-10-CM | POA: Insufficient documentation

## 2015-08-18 ENCOUNTER — Other Ambulatory Visit: Payer: Self-pay | Admitting: Cardiology

## 2015-08-19 ENCOUNTER — Encounter
Admission: RE | Disposition: A | Discharge status: Home / Self Care | Payer: Self-pay | Source: Ambulatory Visit | Attending: Cardiology

## 2015-08-19 ENCOUNTER — Ambulatory Visit
Admission: RE | Admit: 2015-08-19 | Discharge: 2015-08-19 | Disposition: A | Discharge status: Home / Self Care | Payer: Medicare Other | Source: Ambulatory Visit | Attending: Cardiology | Admitting: Cardiology

## 2015-08-19 ENCOUNTER — Encounter: Payer: Self-pay | Admitting: *Deleted

## 2015-08-19 DIAGNOSIS — F419 Anxiety disorder, unspecified: Secondary | ICD-10-CM | POA: Insufficient documentation

## 2015-08-19 DIAGNOSIS — I208 Other forms of angina pectoris: Secondary | ICD-10-CM | POA: Diagnosis present

## 2015-08-19 DIAGNOSIS — Z791 Long term (current) use of non-steroidal anti-inflammatories (NSAID): Secondary | ICD-10-CM | POA: Diagnosis not present

## 2015-08-19 DIAGNOSIS — Z7982 Long term (current) use of aspirin: Secondary | ICD-10-CM | POA: Diagnosis not present

## 2015-08-19 DIAGNOSIS — N401 Enlarged prostate with lower urinary tract symptoms: Secondary | ICD-10-CM | POA: Insufficient documentation

## 2015-08-19 DIAGNOSIS — F329 Major depressive disorder, single episode, unspecified: Secondary | ICD-10-CM | POA: Diagnosis not present

## 2015-08-19 DIAGNOSIS — I259 Chronic ischemic heart disease, unspecified: Secondary | ICD-10-CM | POA: Insufficient documentation

## 2015-08-19 DIAGNOSIS — Z79899 Other long term (current) drug therapy: Secondary | ICD-10-CM | POA: Diagnosis not present

## 2015-08-19 DIAGNOSIS — F17210 Nicotine dependence, cigarettes, uncomplicated: Secondary | ICD-10-CM | POA: Diagnosis not present

## 2015-08-19 DIAGNOSIS — I25119 Atherosclerotic heart disease of native coronary artery with unspecified angina pectoris: Secondary | ICD-10-CM | POA: Diagnosis not present

## 2015-08-19 HISTORY — PX: CARDIAC CATHETERIZATION: SHX172

## 2015-08-19 SURGERY — LEFT HEART CATH AND CORONARY ANGIOGRAPHY
Anesthesia: Moderate Sedation

## 2015-08-19 MED ORDER — IOPAMIDOL (ISOVUE-300) INJECTION 61%
INTRAVENOUS | Status: DC | PRN
  Administered 2015-08-19: 80 mL via INTRA_ARTERIAL

## 2015-08-19 MED ORDER — FENTANYL CITRATE (PF) 100 MCG/2ML IJ SOLN
INTRAMUSCULAR | Status: AC
  Filled 2015-08-19: qty 2

## 2015-08-19 MED ORDER — SODIUM CHLORIDE 0.9 % IV SOLN: 250.0000 mL | INTRAVENOUS | Status: DC | PRN

## 2015-08-19 MED ORDER — MIDAZOLAM HCL 2 MG/2ML IJ SOLN
INTRAMUSCULAR | Status: DC | PRN
  Administered 2015-08-19: 1 mg via INTRAVENOUS

## 2015-08-19 MED ORDER — SODIUM CHLORIDE 0.9% FLUSH: 3.0000 mL | Freq: Two times a day (BID) | INTRAVENOUS | Status: DC

## 2015-08-19 MED ORDER — ASPIRIN 81 MG PO CHEW: 81.0000 mg | CHEWABLE_TABLET | ORAL | Status: DC

## 2015-08-19 MED ORDER — SODIUM CHLORIDE 0.9 % WEIGHT BASED INFUSION
56.2000 mL/h | INTRAVENOUS | Status: DC
  Administered 2015-08-19: 1 mL/kg/h via INTRAVENOUS

## 2015-08-19 MED ORDER — SODIUM CHLORIDE 0.9 % WEIGHT BASED INFUSION
3.0000 mL/kg/h | INTRAVENOUS | Status: DC
Start: 2015-08-20 — End: 2015-08-19

## 2015-08-19 MED ORDER — SODIUM CHLORIDE 0.9% FLUSH: 3.0000 mL | INTRAVENOUS | Status: DC | PRN

## 2015-08-19 MED ORDER — SODIUM CHLORIDE 0.9 % WEIGHT BASED INFUSION: 3.0000 mL/kg/h | INTRAVENOUS | Status: DC

## 2015-08-19 MED ORDER — MIDAZOLAM HCL 2 MG/2ML IJ SOLN
INTRAMUSCULAR | Status: AC
  Filled 2015-08-19: qty 2

## 2015-08-19 MED ORDER — HEPARIN (PORCINE) IN NACL 2-0.9 UNIT/ML-% IJ SOLN
INTRAMUSCULAR | Status: AC
  Filled 2015-08-19: qty 1000

## 2015-08-19 SURGICAL SUPPLY — 10 items
CATH INFINITI 5FR ANG PIGTAIL (CATHETERS) ×1 IMPLANT
CATH INFINITI 5FR JL4 (CATHETERS) ×1 IMPLANT
CATH INFINITI 5FR JL5 (CATHETERS) ×1 IMPLANT
CATH INFINITI JR4 5F (CATHETERS) ×1 IMPLANT
KIT MANI 3VAL PERCEP (MISCELLANEOUS) ×2 IMPLANT
NDL PERC 18GX7CM (NEEDLE) IMPLANT
NEEDLE PERC 18GX7CM (NEEDLE) ×2 IMPLANT
PACK CARDIAC CATH (CUSTOM PROCEDURE TRAY) ×2 IMPLANT
SHEATH AVANTI 5FR X 11CM (SHEATH) ×1 IMPLANT
WIRE EMERALD 3MM-J .035X150CM (WIRE) ×1 IMPLANT

## 2015-08-19 NOTE — H&P (Signed)
Chief Complaint: Chief Complaint  Patient presents with  . New Consultation  . Follow-up  ABNORMAL STRESS TEST PER MILLER  . Chest Pain  X 2 MONTHS. STARTS IN ARMS GOES INTO CHEST. WHEN MOWING/WEEDEATING. DID HAVE CHEST PAIN WHILE ON TREADMILL TODAY  . Shortness of Breath  YES SMOKER  Date of Service: 08/18/2015 Date of Birth: 08-06-1945 PCP: Harrold Donath, MD  History of Present Illness: Todd Whitehead is a 70 y.o.male patient who presents as a urgent visit after undergoing a stress test in internal medicine. Patient has a strong tobacco history smoking 1-2 packs of cigarettes a day since age 68. For the last several weeks has noticed exertional chest pain. He describes this as left arm pain with radiation into his chest. It happens with minimal activity. He states it also can occur in his right arm. He does not have it at rest. He was scheduled for an ETT which was read as abnormal. Review shows equivocal changes in the inferior leads. Patient's symptoms are classic anginal. His risk factors include family history and tobacco abuse. Rest EKG shows nonspecific ST-T wave changes. Patient has no contrast allergies. Risk and benefits of left heart cath were explained to the patient  Past Medical and Surgical History  Past Medical History Past Medical History:  Diagnosis Date  . Benign non-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 03/21/2015  . Chest pain on exertion  . Current tobacco use 03/21/2015  Understands need to quit and is attempting to and modulating  . Depression, major, in remission (CMS-HCC) 03/21/2015  With anxiety controlled with buspirone and xanax prn   Past Surgical History He has a past surgical history that includes Laminectomy Lumbar Spine.   Medications and Allergies  Current Medications  Current Outpatient Prescriptions  Medication Sig Dispense Refill  . ALPRAZolam (XANAX) 1 MG tablet Take 1 tablet (1 mg total) by mouth 3 (three)  times daily as needed. for Sleep 90 tablet 0  . aspirin 81 MG EC tablet Take 1 tablet (81 mg total) by mouth once daily. 30 tablet 11  . busPIRone (BUSPAR) 30 MG tablet Take 1 tablet (30 mg total) by mouth 2 (two) times daily. 180 tablet 3  . naproxen sodium (ALEVE, ANAPROX) 220 MG tablet Take 220 mg by mouth 2 (two) times daily as needed for Pain.  . tamsulosin (FLOMAX) 0.4 mg capsule Take 0.4 mg by mouth once daily. Take 30 minutes after same meal each day.  . traMADol (ULTRAM) 50 mg tablet Take 1 tablet (50 mg total) by mouth every 6 (six) hours as needed for Pain. 30 tablet 0  . nitroGLYcerin (NITROSTAT) 0.4 MG SL tablet Place 1 tablet (0.4 mg total) under the tongue every 5 (five) minutes as needed for Chest pain. May take up to 3 doses. 25 tablet 11   No current facility-administered medications for this visit.   Allergies: Review of patient's allergies indicates no known allergies.  Social and Family History  Social History reports that he has been smoking. He does not have any smokeless tobacco history on file. He reports that he does not drink alcohol.  Family History History reviewed. No pertinent family history.  Review of Systems  Review of Systems  Constitutional: Negative for chills, diaphoresis, fever, malaise/fatigue and weight loss.  HENT: Negative for congestion, ear discharge, hearing loss and tinnitus.  Eyes: Negative for blurred vision.  Respiratory: Positive for shortness of breath. Negative for cough, hemoptysis, sputum production and wheezing.  Cardiovascular:  Positive for chest pain. Negative for palpitations, orthopnea, claudication, leg swelling and PND.  Gastrointestinal: Negative for abdominal pain, blood in stool, constipation, diarrhea, heartburn, melena, nausea and vomiting.  Genitourinary: Negative for dysuria, frequency, hematuria and urgency.  Musculoskeletal: Negative for back pain, falls, joint pain and myalgias.  Skin: Negative for itching and rash.   Neurological: Negative for dizziness, tingling, focal weakness, loss of consciousness, weakness and headaches.  Endo/Heme/Allergies: Negative for polydipsia. Does not bruise/bleed easily.  Psychiatric/Behavioral: Negative for depression, memory loss and substance abuse. The patient is not nervous/anxious.   Physical Examination   Vitals:BP 120/80  Pulse 66  Ht 172.7 cm (5\' 8" )  Wt 56.2 kg (124 lb)  BMI 18.85 kg/m2 Ht:172.7 cm (5\' 8" ) Wt:56.2 kg (124 lb) [id number] surface area is 1.64 meters squared. Body mass index is 18.85 kg/(m^2).  Wt Readings from Last 3 Encounters:  08/18/15 56.2 kg (124 lb)  08/09/15 56.5 kg (124 lb 9.6 oz)  03/21/15 58.5 kg (129 lb)   BP Readings from Last 3 Encounters:  08/18/15 120/80  08/09/15 144/68  03/21/15 122/73   General appearance appears in no acute distress  Head Mouth and Eye exam Normocephalic, without obvious abnormality, atraumatic Dentition is good Eyes appear anicteric   Neck exam Thyroid: normal  Nodes: no obvious adenopathy  LUNGS Breath Sounds: Normal Percussion: Normal  CARDIOVASCULAR JVP CV wave: no HJR: no Elevation at 90 degrees: None Carotid Pulse: normal pulsation bilaterally Bruit: None Apex: apical impulse normal  Auscultation Rhythm: normal sinus rhythm S1: normal S2: normal Clicks: no Rub: no Murmurs: no murmurs  Gallop: None ABDOMEN Liver enlargement: no Pulsatile aorta: no Ascites: no Bruits: no  EXTREMITIES Clubbing: no Edema: trace to 1+ bilateral pedal edema Pulses: peripheral pulses symmetrical Femoral Bruits: no Amputation: no SKIN Rash: no Cyanosis: no Embolic phemonenon: no Bruising: no NEURO Alert and Oriented to person, place and time: yes Non focal: yes  PSYCH: Pt appears to have normal affect  Diagnostic Studies Reviewed:  EKG EKG demonstrated normal sinus rhythm, nonspecific ST and T waves changes.  Assessment and Plan   70 y.o. male with  ICD-10-CM ICD-9-CM   1. Angina effort (CMS-HCC)-patient with classic anginal symptoms including chest pain with activity. No rest pain. ETT read as showing inferior ischemia. Plan to proceed with a left heart cath in a patient with class IV anginal symptoms and an abnormal functional study. Risk and benefits of left heart cath were explained to the patient he agrees to proceed. We will add somewhat nitroglycerin to be taken as needed and continue with enteric-coated aspirin. Further recommendations after catheterization I20.8 413.9 nitroGLYcerin (NITROSTAT) 0.4 MG SL tablet  Basic Metabolic Panel (BMP)  CBC w/auto Differential (5 Part)  CATH procedure request to cath lab  2. Current tobacco use-smoking cessation recommended. States he is trying to stop. Z72.0 305.1   Return in about 1 week (around 08/25/2015).  These notes generated with voice recognition software. I apologize for typographical errors.  Sydnee Levans, MD   Pt examined and history reviewed. No change from above.

## 2015-08-19 NOTE — Discharge Instructions (Signed)

## 2015-08-22 DIAGNOSIS — I2510 Atherosclerotic heart disease of native coronary artery without angina pectoris: Secondary | ICD-10-CM | POA: Insufficient documentation

## 2015-09-09 DIAGNOSIS — Z951 Presence of aortocoronary bypass graft: Secondary | ICD-10-CM | POA: Insufficient documentation

## 2015-09-09 DIAGNOSIS — I779 Disorder of arteries and arterioles, unspecified: Secondary | ICD-10-CM | POA: Insufficient documentation

## 2015-09-09 HISTORY — DX: Presence of aortocoronary bypass graft: Z95.1

## 2015-09-11 DIAGNOSIS — F418 Other specified anxiety disorders: Secondary | ICD-10-CM | POA: Insufficient documentation

## 2015-09-11 DIAGNOSIS — F419 Anxiety disorder, unspecified: Secondary | ICD-10-CM | POA: Insufficient documentation

## 2015-10-04 ENCOUNTER — Other Ambulatory Visit: Payer: Self-pay | Admitting: Internal Medicine

## 2015-10-04 DIAGNOSIS — R1084 Generalized abdominal pain: Secondary | ICD-10-CM

## 2015-10-04 DIAGNOSIS — R319 Hematuria, unspecified: Secondary | ICD-10-CM

## 2015-10-04 DIAGNOSIS — J4489 Other specified chronic obstructive pulmonary disease: Secondary | ICD-10-CM | POA: Insufficient documentation

## 2015-10-04 DIAGNOSIS — J449 Chronic obstructive pulmonary disease, unspecified: Secondary | ICD-10-CM | POA: Insufficient documentation

## 2015-10-05 ENCOUNTER — Ambulatory Visit
Admission: RE | Admit: 2015-10-05 | Discharge: 2015-10-05 | Disposition: A | Discharge status: Home / Self Care | Payer: Medicare Other | Source: Ambulatory Visit | Attending: Internal Medicine | Admitting: Internal Medicine

## 2015-10-05 DIAGNOSIS — R195 Other fecal abnormalities: Secondary | ICD-10-CM | POA: Insufficient documentation

## 2015-10-05 DIAGNOSIS — J90 Pleural effusion, not elsewhere classified: Secondary | ICD-10-CM | POA: Insufficient documentation

## 2015-10-05 DIAGNOSIS — J9811 Atelectasis: Secondary | ICD-10-CM | POA: Insufficient documentation

## 2015-10-05 DIAGNOSIS — N3289 Other specified disorders of bladder: Secondary | ICD-10-CM | POA: Diagnosis not present

## 2015-10-05 DIAGNOSIS — R1084 Generalized abdominal pain: Secondary | ICD-10-CM

## 2015-10-05 DIAGNOSIS — R319 Hematuria, unspecified: Secondary | ICD-10-CM

## 2015-10-05 DIAGNOSIS — M5136 Other intervertebral disc degeneration, lumbar region: Secondary | ICD-10-CM | POA: Diagnosis not present

## 2015-10-05 DIAGNOSIS — I700 Atherosclerosis of aorta: Secondary | ICD-10-CM | POA: Diagnosis not present

## 2015-10-05 DIAGNOSIS — M4186 Other forms of scoliosis, lumbar region: Secondary | ICD-10-CM | POA: Insufficient documentation

## 2015-10-10 DIAGNOSIS — R7989 Other specified abnormal findings of blood chemistry: Secondary | ICD-10-CM | POA: Insufficient documentation

## 2015-10-10 DIAGNOSIS — R600 Localized edema: Secondary | ICD-10-CM | POA: Insufficient documentation

## 2015-11-29 ENCOUNTER — Other Ambulatory Visit: Payer: Self-pay | Admitting: Gastroenterology

## 2015-11-29 DIAGNOSIS — K859 Acute pancreatitis without necrosis or infection, unspecified: Secondary | ICD-10-CM

## 2015-12-06 ENCOUNTER — Ambulatory Visit
Admission: RE | Admit: 2015-12-06 | Discharge: 2015-12-06 | Disposition: A | Discharge status: Home / Self Care | Payer: Medicare Other | Source: Ambulatory Visit | Attending: Gastroenterology | Admitting: Gastroenterology

## 2015-12-06 DIAGNOSIS — K8020 Calculus of gallbladder without cholecystitis without obstruction: Secondary | ICD-10-CM | POA: Insufficient documentation

## 2015-12-06 DIAGNOSIS — N281 Cyst of kidney, acquired: Secondary | ICD-10-CM | POA: Diagnosis not present

## 2015-12-06 DIAGNOSIS — K859 Acute pancreatitis without necrosis or infection, unspecified: Secondary | ICD-10-CM

## 2015-12-06 DIAGNOSIS — I700 Atherosclerosis of aorta: Secondary | ICD-10-CM | POA: Insufficient documentation

## 2015-12-06 DIAGNOSIS — Z09 Encounter for follow-up examination after completed treatment for conditions other than malignant neoplasm: Secondary | ICD-10-CM | POA: Diagnosis present

## 2015-12-12 ENCOUNTER — Other Ambulatory Visit: Payer: Self-pay | Admitting: Gastroenterology

## 2015-12-12 DIAGNOSIS — Z8719 Personal history of other diseases of the digestive system: Secondary | ICD-10-CM

## 2015-12-15 ENCOUNTER — Other Ambulatory Visit: Payer: Self-pay | Admitting: Gastroenterology

## 2015-12-15 DIAGNOSIS — R1084 Generalized abdominal pain: Secondary | ICD-10-CM

## 2015-12-16 ENCOUNTER — Ambulatory Visit
Admission: RE | Admit: 2015-12-16 | Discharge: 2015-12-16 | Disposition: A | Discharge status: Home / Self Care | Payer: Medicare Other | Source: Ambulatory Visit | Attending: Gastroenterology | Admitting: Gastroenterology

## 2015-12-16 DIAGNOSIS — I700 Atherosclerosis of aorta: Secondary | ICD-10-CM | POA: Insufficient documentation

## 2015-12-16 DIAGNOSIS — R1084 Generalized abdominal pain: Secondary | ICD-10-CM

## 2015-12-16 HISTORY — DX: Malignant melanoma of skin, unspecified: C43.9

## 2015-12-16 MED ORDER — IOPAMIDOL (ISOVUE-370) INJECTION 76%
75.0000 mL | Freq: Once | INTRAVENOUS | Status: AC | PRN
  Administered 2015-12-16: 75 mL via INTRAVENOUS

## 2015-12-19 DIAGNOSIS — Z0000 Encounter for general adult medical examination without abnormal findings: Secondary | ICD-10-CM | POA: Insufficient documentation

## 2015-12-20 ENCOUNTER — Ambulatory Visit
Admission: RE | Admit: 2015-12-20 | Discharge: 2015-12-20 | Disposition: A | Discharge status: Home / Self Care | Payer: Medicare Other | Source: Ambulatory Visit | Attending: Physician Assistant | Admitting: Physician Assistant

## 2015-12-20 ENCOUNTER — Other Ambulatory Visit: Payer: Self-pay | Admitting: Physician Assistant

## 2015-12-20 DIAGNOSIS — J320 Chronic maxillary sinusitis: Secondary | ICD-10-CM | POA: Diagnosis not present

## 2015-12-20 DIAGNOSIS — R29898 Other symptoms and signs involving the musculoskeletal system: Secondary | ICD-10-CM | POA: Diagnosis present

## 2015-12-26 ENCOUNTER — Other Ambulatory Visit: Payer: Self-pay | Admitting: Nurse Practitioner

## 2015-12-26 DIAGNOSIS — R202 Paresthesia of skin: Principal | ICD-10-CM

## 2015-12-26 DIAGNOSIS — R200 Anesthesia of skin: Secondary | ICD-10-CM

## 2016-01-04 ENCOUNTER — Ambulatory Visit: Payer: Medicare Other

## 2016-03-16 ENCOUNTER — Encounter: Payer: Self-pay | Admitting: *Deleted

## 2016-03-19 ENCOUNTER — Ambulatory Visit: Payer: Medicare Other | Admitting: Anesthesiology

## 2016-03-19 ENCOUNTER — Encounter
Admission: RE | Disposition: A | Discharge status: Home / Self Care | Payer: Self-pay | Source: Ambulatory Visit | Attending: Gastroenterology

## 2016-03-19 ENCOUNTER — Encounter: Payer: Self-pay | Admitting: *Deleted

## 2016-03-19 ENCOUNTER — Ambulatory Visit
Admission: RE | Admit: 2016-03-19 | Discharge: 2016-03-19 | Disposition: A | Discharge status: Home / Self Care | Payer: Medicare Other | Source: Ambulatory Visit | Attending: Gastroenterology | Admitting: Gastroenterology

## 2016-03-19 DIAGNOSIS — Z951 Presence of aortocoronary bypass graft: Secondary | ICD-10-CM | POA: Diagnosis not present

## 2016-03-19 DIAGNOSIS — D124 Benign neoplasm of descending colon: Secondary | ICD-10-CM | POA: Insufficient documentation

## 2016-03-19 DIAGNOSIS — Z91011 Allergy to milk products: Secondary | ICD-10-CM | POA: Diagnosis not present

## 2016-03-19 DIAGNOSIS — I252 Old myocardial infarction: Secondary | ICD-10-CM | POA: Insufficient documentation

## 2016-03-19 DIAGNOSIS — Q438 Other specified congenital malformations of intestine: Secondary | ICD-10-CM | POA: Insufficient documentation

## 2016-03-19 DIAGNOSIS — Z1211 Encounter for screening for malignant neoplasm of colon: Secondary | ICD-10-CM | POA: Diagnosis present

## 2016-03-19 DIAGNOSIS — I2510 Atherosclerotic heart disease of native coronary artery without angina pectoris: Secondary | ICD-10-CM | POA: Insufficient documentation

## 2016-03-19 DIAGNOSIS — D128 Benign neoplasm of rectum: Secondary | ICD-10-CM | POA: Insufficient documentation

## 2016-03-19 DIAGNOSIS — F419 Anxiety disorder, unspecified: Secondary | ICD-10-CM | POA: Diagnosis not present

## 2016-03-19 DIAGNOSIS — Z7982 Long term (current) use of aspirin: Secondary | ICD-10-CM | POA: Diagnosis not present

## 2016-03-19 DIAGNOSIS — Z79899 Other long term (current) drug therapy: Secondary | ICD-10-CM | POA: Insufficient documentation

## 2016-03-19 DIAGNOSIS — J449 Chronic obstructive pulmonary disease, unspecified: Secondary | ICD-10-CM | POA: Diagnosis not present

## 2016-03-19 DIAGNOSIS — Z85820 Personal history of malignant melanoma of skin: Secondary | ICD-10-CM | POA: Diagnosis not present

## 2016-03-19 DIAGNOSIS — I10 Essential (primary) hypertension: Secondary | ICD-10-CM | POA: Insufficient documentation

## 2016-03-19 DIAGNOSIS — K635 Polyp of colon: Secondary | ICD-10-CM | POA: Insufficient documentation

## 2016-03-19 DIAGNOSIS — F17200 Nicotine dependence, unspecified, uncomplicated: Secondary | ICD-10-CM | POA: Diagnosis not present

## 2016-03-19 DIAGNOSIS — I4891 Unspecified atrial fibrillation: Secondary | ICD-10-CM | POA: Diagnosis not present

## 2016-03-19 HISTORY — PX: COLONOSCOPY WITH PROPOFOL: SHX5780

## 2016-03-19 HISTORY — DX: Cardiac arrhythmia, unspecified: I49.9

## 2016-03-19 SURGERY — COLONOSCOPY WITH PROPOFOL
Anesthesia: General

## 2016-03-19 MED ORDER — EPHEDRINE SULFATE 50 MG/ML IJ SOLN
INTRAMUSCULAR | Status: DC | PRN
  Administered 2016-03-19: 10 mg via INTRAVENOUS

## 2016-03-19 MED ORDER — PROPOFOL 500 MG/50ML IV EMUL
INTRAVENOUS | Status: AC
  Filled 2016-03-19: qty 100

## 2016-03-19 MED ORDER — SODIUM CHLORIDE 0.9 % IV SOLN: INTRAVENOUS | Status: DC

## 2016-03-19 MED ORDER — PROPOFOL 10 MG/ML IV BOLUS
INTRAVENOUS | Status: DC | PRN
  Administered 2016-03-19: 40 mg via INTRAVENOUS

## 2016-03-19 MED ORDER — LIDOCAINE HCL (PF) 2 % IJ SOLN
INTRAMUSCULAR | Status: DC | PRN
  Administered 2016-03-19: 50 mg via INTRADERMAL

## 2016-03-19 MED ORDER — PROPOFOL 500 MG/50ML IV EMUL
INTRAVENOUS | Status: DC | PRN
  Administered 2016-03-19: 120 ug/kg/min via INTRAVENOUS

## 2016-03-19 MED ORDER — SODIUM CHLORIDE 0.9 % IV SOLN
INTRAVENOUS | Status: DC | PRN
  Administered 2016-03-19 (×4): 100 ug via INTRAVENOUS

## 2016-03-19 MED ORDER — SODIUM CHLORIDE 0.9 % IV SOLN
INTRAVENOUS | Status: DC
  Administered 2016-03-19: 09:00:00 via INTRAVENOUS

## 2016-03-19 MED ORDER — SODIUM CHLORIDE 0.9 % IV SOLN
INTRAVENOUS | Status: DC | PRN
  Administered 2016-03-19: 09:00:00 via INTRAVENOUS

## 2016-03-19 NOTE — Anesthesia Post-op Follow-up Note (Signed)
Anesthesia QCDR form completed.        

## 2016-03-19 NOTE — Anesthesia Postprocedure Evaluation (Signed)
Anesthesia Post Note  Patient: Todd Whitehead  Procedure(s) Performed: Procedure(s) (LRB): COLONOSCOPY WITH PROPOFOL (N/A)  Patient location during evaluation: Endoscopy Anesthesia Type: General Level of consciousness: awake and alert and oriented Pain management: pain level controlled Vital Signs Assessment: post-procedure vital signs reviewed and stable Respiratory status: spontaneous breathing, nonlabored ventilation and respiratory function stable Cardiovascular status: blood pressure returned to baseline and stable Postop Assessment: no signs of nausea or vomiting Anesthetic complications: no     Last Vitals:  Vitals:   03/19/16 1050 03/19/16 1100  BP: 135/73 (!) 145/70  Pulse: 76 (!) 101  Resp: (!) 29 (!) 21  Temp:      Last Pain:  Vitals:   03/19/16 0821  TempSrc: Tympanic                 Richard Holz

## 2016-03-19 NOTE — Transfer of Care (Signed)
Immediate Anesthesia Transfer of Care Note  Patient: Todd Whitehead  Procedure(s) Performed: Procedure(s): COLONOSCOPY WITH PROPOFOL (N/A)  Patient Location: Endoscopy Unit  Anesthesia Type:General  Level of Consciousness: awake and alert   Airway & Oxygen Therapy: Patient Spontanous Breathing and Patient connected to nasal cannula oxygen  Post-op Assessment: Report given to RN and Post -op Vital signs reviewed and stable  Post vital signs: Reviewed and stable  Last Vitals:  Vitals:   03/19/16 0821 03/19/16 1030  BP: (!) 99/59 (!) 84/39  Pulse: 89 73  Resp: 16 (!) 25  Temp: (!) 35.9 C     Last Pain:  Vitals:   03/19/16 0821  TempSrc: Tympanic         Complications: No apparent anesthesia complications

## 2016-03-19 NOTE — H&P (Signed)
Outpatient short stay form Pre-procedure 03/19/2016 9:27 AM Todd Sails MD  Primary Physician: Dr. Frazier Richards  Reason for visit:  Colonoscopy  History of present illness:  Patient is a 71 year old male presenting today as above. He's never had a colonoscopy in the past. He has had some GI issues recently including weight loss some lower abdominal discomfort some loose stools as well as finding of anemia. He does have a history of chronic respiratory failure/COPD as well as coronary artery disease with coronary artery bypass grafting. Patient does take a 81 mg aspirin daily however also states that yesterday he took 1 full-strength 325 mg aspirin. I discussed with him that this does increase his risk for bleeding and I will likely need to limit diet a bit should we do interventional procedures.    Current Facility-Administered Medications:  .  0.9 %  sodium chloride infusion, , Intravenous, Continuous, Todd Sails, MD, Last Rate: 20 mL/hr at 03/19/16 0841 .  0.9 %  sodium chloride infusion, , Intravenous, Continuous, Todd Sails, MD  Prescriptions Prior to Admission  Medication Sig Dispense Refill Last Dose  . ALPRAZolam (XANAX) 1 MG tablet Take 1 mg by mouth 3 (three) times daily as needed for sleep.   03/18/2016 at Unknown time  . aspirin EC 81 MG tablet Take 81 mg by mouth daily.   03/18/2016 at Unknown time  . brimonidine (ALPHAGAN) 0.15 % ophthalmic solution Place 1 drop into both eyes 2 (two) times daily.   03/19/2016 at Unknown time  . busPIRone (BUSPAR) 30 MG tablet Take 30 mg by mouth 2 (two) times daily.   03/18/2016 at Unknown time  . latanoprost (XALATAN) 0.005 % ophthalmic solution Place 1 drop into both eyes at bedtime.   03/18/2016 at Unknown time  . metoprolol succinate (TOPROL XL) 25 MG 24 hr tablet Take 12.5 mg by mouth daily.   03/19/2016 at 0600  . naproxen sodium (ANAPROX) 220 MG tablet Take 440 mg by mouth as needed (for pain.).   03/18/2016 at Unknown  time  . HYDROcodone-acetaminophen (NORCO/VICODIN) 5-325 MG per tablet Take 1 tablet by mouth every 4 (four) hours as needed for pain.   Not Taking at Unknown time  . nitroGLYCERIN (NITROSTAT) 0.6 MG SL tablet Place 0.6 mg under the tongue every 5 (five) minutes as needed for chest pain.   08/19/2015 at 0600  . oxyCODONE-acetaminophen (PERCOCET/ROXICET) 5-325 MG per tablet Take 1-2 tablets by mouth every 4 (four) hours as needed. (Patient not taking: Reported on 08/19/2015) 60 tablet 0 Not Taking at Unknown time  . tamsulosin (FLOMAX) 0.4 MG CAPS capsule Take 1 capsule (0.4 mg total) by mouth daily after breakfast. 30 capsule 1 08/19/2015 at 0500  . traMADol (ULTRAM) 50 MG tablet Take 50 mg by mouth 2 (two) times daily.   08/19/2015 at 0500     Allergies  Allergen Reactions  . Other     Cheese,butter,sour cream,cottage cheese     Past Medical History:  Diagnosis Date  . Anxiety   . Arthritis   . COPD (chronic obstructive pulmonary disease) ([place])    smoker  . Dysrhythmia   . Hypertension    on  no meds  . Melanoma ([place])    Resected from Left upper arm.     Review of systems:      Physical Exam    Heart and lungs: Regular rate and rhythm without rub or gallop lungs are bilaterally clear    HEENT: Normocephalic atraumatic eyes  are anicteric    Other:     Pertinant exam for procedure: Off nontender nondistended bowel sounds positive normoactive.    Planned proceedures: Colonoscopy and indicated procedures. I have discussed the risks benefits and complications of procedures to include not limited to bleeding, infection, perforation and the risk of sedation and the patient wishes to proceed.    Todd Sails, MD Gastroenterology 03/19/2016  9:27 AM

## 2016-03-19 NOTE — Op Note (Signed)
[facility] Gastroenterology Patient Name: Todd Whitehead Procedure Date: 03/19/2016 9:29 AM MRN: [id number] Account #: [id number] Date of Birth: May 30, 1945 Admit Type: Outpatient Age: 71 Room: [facility] ENDO ROOM 3 Gender: Male Note Status: Finalized Procedure:            Colonoscopy Indications:          Screening for colorectal malignant neoplasm Providers:            [name], MD Referring MD:         Marney Setting. Ouida Sills MD, MD (Referring MD) Medicines:            Monitored Anesthesia Care Complications:        No immediate complications. Procedure:            Pre-Anesthesia Assessment:                       - ASA Grade Assessment: III - A patient with severe                        systemic disease.                       After obtaining informed consent, the colonoscope was                        passed under direct vision. Throughout the procedure,                        the patient's blood pressure, pulse, and oxygen                        saturations were monitored continuously. The                        Colonoscope was introduced through the anus with the                        intention of advancing to the cecum. The scope was                        advanced to the splenic flexure before the procedure                        was aborted. Medications were given. The quality of the                        bowel preparation was good. Findings:      A 2 mm polyp was found in the descending colon. The polyp was sessile.       The polyp was removed with a cold biopsy forceps. Resection and       retrieval were complete.      A 5 mm polyp was found in the distal descending colon. The polyp was       sessile. The polyp was removed with a cold snare. Resection and       retrieval were complete. To prevent bleeding after the polypectomy, one       hemostatic clip was successfully placed. There was no bleeding at the       end of the maneuver.      Four sessile  polyps  were found in the rectum. The polyps were 1 to 2 mm       in size. These polyps were removed with a cold biopsy forceps. Resection       and retrieval were complete.      A 4 mm polyp was found in the rectum. The polyp was sessile. The polyp       was removed with a cold snare. Resection and retrieval were complete.      A 3 mm polyp was found in the recto-sigmoid colon. The polyp was       sessile. The polyp was removed with a cold snare. Resection and       retrieval were complete.      The splenic flexure was significantly tortuous, and I was unable to pass       the scope through to the proximal colon despite multiple position       changes and abdominal support. Impression:           - One 2 mm polyp in the descending colon, removed with                        a cold biopsy forceps. Resected and retrieved.                       - One 5 mm polyp in the distal descending colon,                        removed with a cold snare. Resected and retrieved. Clip                        was placed.                       - Four 1 to 2 mm polyps in the rectum, removed with a                        cold biopsy forceps. Resected and retrieved.                       - One 4 mm polyp in the rectum, removed with a cold                        snare. Resected and retrieved.                       - One 3 mm polyp at the recto-sigmoid colon, removed                        with a cold snare. Resected and retrieved.                       - Tortuous colon. Recommendation:       - Discharge patient to home.                       - Full liquid diet for 1 day, then advance as tolerated                        to low residue diet for 2 days. Procedure Code(s):    --- Professional ---  [id number], 52, Colonoscopy, flexible; with removal of                        tumor(s), polyp(s), or other lesion(s) by snare                        technique                       45380, 59,52, Colonoscopy,  flexible; with biopsy,                        single or multiple Diagnosis Code(s):    --- Professional ---                       Z12.11, Encounter for screening for malignant neoplasm                        of colon                       D12.4, Benign neoplasm of descending colon                       K62.1, Rectal polyp                       D12.7, Benign neoplasm of rectosigmoid junction                       Q43.8, Other specified congenital malformations of                        intestine CPT copyright 2016 American Medical Association. All rights reserved. The codes documented in this report are preliminary and upon coder review may  be revised to meet current compliance requirements. [name], MD 03/19/2016 10:32:49 AM This report has been signed electronically. Number of Addenda: 0 Note Initiated On: 03/19/2016 9:29 AM Total Procedure Duration: 0 hours 47 minutes 2 seconds       [facility]

## 2016-03-19 NOTE — OR Nursing (Deleted)
Rectosigmoid colon polyp cold snare dissected but not retrieved.

## 2016-03-19 NOTE — Anesthesia Preprocedure Evaluation (Signed)
Anesthesia Evaluation  Patient identified by MRN, date of birth, ID band Patient awake    Reviewed: Allergy & Precautions, NPO status , Patient's Chart, lab work & pertinent test results  History of Anesthesia Complications Negative for: history of anesthetic complications  Airway Mallampati: II  TM Distance: >3 FB Neck ROM: Full    Dental  (+) Poor Dentition   Pulmonary neg shortness of breath, neg sleep apnea, COPD, Current Smoker,    breath sounds clear to auscultation- rhonchi (-) wheezing      Cardiovascular hypertension, Pt. on medications (-) angina+ CAD, + Past MI and + CABG (August 2017 LIM to LAD, SVG to OM1)  Dysrhythmias: hx of post CABG afib.  Rhythm:Regular Rate:Normal - Systolic murmurs and - Diastolic murmurs Echo 0000000: NORMAL LEFT VENTRICULAR SYSTOLIC FUNCTION WITH MODERATE LVH NORMAL RIGHT VENTRICULAR SYSTOLIC FUNCTION VALVULAR REGURGITATION: MILD MR, TRIVIAL TR NO VALVULAR STENOSIS S/P CABG    Neuro/Psych Anxiety negative neurological ROS     GI/Hepatic negative GI ROS, Neg liver ROS,   Endo/Other  negative endocrine ROSneg diabetes  Renal/GU negative Renal ROS     Musculoskeletal  (+) Arthritis ,   Abdominal (+) - obese,   Peds  Hematology negative hematology ROS (+)   Anesthesia Other Findings Past Medical History: No date: Anxiety No date: Arthritis No date: COPD (chronic obstructive pulmonary disease) (*     Comment: smoker No date: Dysrhythmia No date: Hypertension     Comment: on  no meds No date: Melanoma ([place])     Comment: Resected from Left upper arm.    Reproductive/Obstetrics                            Anesthesia Physical Anesthesia Plan  ASA: III  Anesthesia Plan: General   Post-op Pain Management:    Induction: Intravenous  Airway Management Planned: Natural Airway  Additional Equipment:   Intra-op Plan:   Post-operative  Plan:   Informed Consent: I have reviewed the patients History and Physical, chart, labs and discussed the procedure including the risks, benefits and alternatives for the proposed anesthesia with the patient or authorized representative who has indicated his/her understanding and acceptance.   Dental advisory given  Plan Discussed with: CRNA and Anesthesiologist  Anesthesia Plan Comments:         Anesthesia Quick Evaluation

## 2016-03-19 NOTE — OR Nursing (Signed)
Colonoscopy complete to splenic flexure, aborted due to tortuosity.

## 2016-03-20 ENCOUNTER — Encounter: Payer: Self-pay | Admitting: Gastroenterology

## 2016-03-20 LAB — SURGICAL PATHOLOGY

## 2016-03-30 ENCOUNTER — Other Ambulatory Visit: Payer: Self-pay | Admitting: Gastroenterology

## 2016-03-30 DIAGNOSIS — Q438 Other specified congenital malformations of intestine: Secondary | ICD-10-CM

## 2016-04-10 ENCOUNTER — Ambulatory Visit
Admission: RE | Admit: 2016-04-10 | Discharge: 2016-04-10 | Disposition: A | Discharge status: Home / Self Care | Payer: Medicare Other | Source: Ambulatory Visit | Attending: Gastroenterology | Admitting: Gastroenterology

## 2016-04-10 ENCOUNTER — Other Ambulatory Visit: Payer: Self-pay | Admitting: Gastroenterology

## 2016-04-10 DIAGNOSIS — Q438 Other specified congenital malformations of intestine: Secondary | ICD-10-CM

## 2016-04-10 DIAGNOSIS — I70208 Unspecified atherosclerosis of native arteries of extremities, other extremity: Secondary | ICD-10-CM | POA: Diagnosis not present

## 2016-06-20 ENCOUNTER — Telehealth: Payer: Self-pay | Admitting: *Deleted

## 2016-06-20 DIAGNOSIS — Z87891 Personal history of nicotine dependence: Secondary | ICD-10-CM

## 2016-06-20 NOTE — Telephone Encounter (Signed)
Received referral for initial lung cancer screening scan. Contacted patient and obtained smoking history,(current, 112 pack year) as well as answering questions related to screening process. Patient denies signs of lung cancer such as weight loss or hemoptysis. Patient denies comorbidity that would prevent curative treatment if lung cancer were found. Patient is scheduled for shared decision making visit and CT scan on 07/03/16.

## 2016-07-02 ENCOUNTER — Encounter: Payer: Self-pay | Admitting: Oncology

## 2016-07-03 ENCOUNTER — Inpatient Hospital Stay: Payer: Medicare Other | Attending: Oncology | Admitting: Oncology

## 2016-07-03 ENCOUNTER — Ambulatory Visit
Admission: RE | Admit: 2016-07-03 | Discharge: 2016-07-03 | Disposition: A | Discharge status: Home / Self Care | Payer: Medicare Other | Source: Ambulatory Visit | Attending: Oncology | Admitting: Oncology

## 2016-07-03 DIAGNOSIS — Z87891 Personal history of nicotine dependence: Secondary | ICD-10-CM

## 2016-07-03 DIAGNOSIS — I2510 Atherosclerotic heart disease of native coronary artery without angina pectoris: Secondary | ICD-10-CM | POA: Insufficient documentation

## 2016-07-03 DIAGNOSIS — F17210 Nicotine dependence, cigarettes, uncomplicated: Secondary | ICD-10-CM

## 2016-07-03 DIAGNOSIS — Z122 Encounter for screening for malignant neoplasm of respiratory organs: Secondary | ICD-10-CM | POA: Insufficient documentation

## 2016-07-03 DIAGNOSIS — I700 Atherosclerosis of aorta: Secondary | ICD-10-CM | POA: Insufficient documentation

## 2016-07-03 DIAGNOSIS — F17200 Nicotine dependence, unspecified, uncomplicated: Secondary | ICD-10-CM | POA: Diagnosis not present

## 2016-07-03 DIAGNOSIS — Z951 Presence of aortocoronary bypass graft: Secondary | ICD-10-CM | POA: Insufficient documentation

## 2016-07-03 DIAGNOSIS — J432 Centrilobular emphysema: Secondary | ICD-10-CM | POA: Diagnosis not present

## 2016-07-04 ENCOUNTER — Encounter: Payer: Self-pay | Admitting: *Deleted

## 2016-07-08 DIAGNOSIS — Z87891 Personal history of nicotine dependence: Secondary | ICD-10-CM | POA: Insufficient documentation

## 2016-07-08 NOTE — Progress Notes (Signed)
In accordance with CMS guidelines, patient has met eligibility criteria including age, absence of signs or symptoms of lung cancer.  Social History  Substance Use Topics  . Smoking status: Current Every Day Smoker    Packs/day: 2.00    Years: 56.00    Types: Cigarettes  . Smokeless tobacco: Never Used  . Alcohol use No     A shared decision-making session was conducted prior to the performance of CT scan. This includes one or more decision aids, includes benefits and harms of screening, follow-up diagnostic testing, over-diagnosis, false positive rate, and total radiation exposure.  Counseling on the importance of adherence to annual lung cancer LDCT screening, impact of co-morbidities, and ability or willingness to undergo diagnosis and treatment is imperative for compliance of the program.  Counseling on the importance of continued smoking cessation for former smokers; the importance of smoking cessation for current smokers, and information about tobacco cessation interventions have been given to patient including [place] [facility]uit [facility] and 1800 quit [place] programs.  Written order for lung cancer screening with LDCT has been given to the patient and any and all questions have been answered to the best of my abilities.   Yearly follow up will be coordinated by Shawn Perkins, Thoracic Navigator.  

## 2016-08-17 ENCOUNTER — Emergency Department: Payer: Medicare Other

## 2016-08-17 ENCOUNTER — Encounter: Payer: Self-pay | Admitting: Emergency Medicine

## 2016-08-17 ENCOUNTER — Inpatient Hospital Stay (HOSPITAL_COMMUNITY)
Admission: AD | Admit: 2016-08-17 | Discharge: 2016-08-20 | DRG: 419 | Disposition: A | Discharge status: Home / Self Care | Payer: Medicare Other | Source: Other Acute Inpatient Hospital | Attending: Internal Medicine | Admitting: Internal Medicine

## 2016-08-17 ENCOUNTER — Inpatient Hospital Stay
Admission: EM | Admit: 2016-08-17 | Discharge: 2016-08-17 | DRG: 446 | Disposition: A | Discharge status: Other Acute Inpatient Hospital | Payer: Medicare Other | Attending: Surgery | Admitting: Surgery

## 2016-08-17 ENCOUNTER — Ambulatory Visit (HOSPITAL_COMMUNITY)
Admission: AD | Admit: 2016-08-17 | Payer: Medicare Other | Source: Other Acute Inpatient Hospital | Admitting: Family Medicine

## 2016-08-17 DIAGNOSIS — M1990 Unspecified osteoarthritis, unspecified site: Secondary | ICD-10-CM | POA: Diagnosis present

## 2016-08-17 DIAGNOSIS — K828 Other specified diseases of gallbladder: Secondary | ICD-10-CM | POA: Diagnosis present

## 2016-08-17 DIAGNOSIS — I340 Nonrheumatic mitral (valve) insufficiency: Secondary | ICD-10-CM | POA: Diagnosis present

## 2016-08-17 DIAGNOSIS — K838 Other specified diseases of biliary tract: Secondary | ICD-10-CM | POA: Diagnosis not present

## 2016-08-17 DIAGNOSIS — N4889 Other specified disorders of penis: Secondary | ICD-10-CM | POA: Diagnosis not present

## 2016-08-17 DIAGNOSIS — K8043 Calculus of bile duct with acute cholecystitis with obstruction: Secondary | ICD-10-CM

## 2016-08-17 DIAGNOSIS — I10 Essential (primary) hypertension: Secondary | ICD-10-CM | POA: Diagnosis present

## 2016-08-17 DIAGNOSIS — R7989 Other specified abnormal findings of blood chemistry: Secondary | ICD-10-CM

## 2016-08-17 DIAGNOSIS — F17210 Nicotine dependence, cigarettes, uncomplicated: Secondary | ICD-10-CM | POA: Diagnosis present

## 2016-08-17 DIAGNOSIS — I2510 Atherosclerotic heart disease of native coronary artery without angina pectoris: Secondary | ICD-10-CM | POA: Diagnosis present

## 2016-08-17 DIAGNOSIS — K8063 Calculus of gallbladder and bile duct with acute cholecystitis with obstruction: Principal | ICD-10-CM | POA: Diagnosis present

## 2016-08-17 DIAGNOSIS — K8062 Calculus of gallbladder and bile duct with acute cholecystitis without obstruction: Principal | ICD-10-CM | POA: Diagnosis present

## 2016-08-17 DIAGNOSIS — K8050 Calculus of bile duct without cholangitis or cholecystitis without obstruction: Secondary | ICD-10-CM

## 2016-08-17 DIAGNOSIS — J449 Chronic obstructive pulmonary disease, unspecified: Secondary | ICD-10-CM | POA: Diagnosis present

## 2016-08-17 DIAGNOSIS — R945 Abnormal results of liver function studies: Secondary | ICD-10-CM | POA: Diagnosis not present

## 2016-08-17 DIAGNOSIS — Z91018 Allergy to other foods: Secondary | ICD-10-CM

## 2016-08-17 DIAGNOSIS — K831 Obstruction of bile duct: Secondary | ICD-10-CM | POA: Diagnosis not present

## 2016-08-17 DIAGNOSIS — F17200 Nicotine dependence, unspecified, uncomplicated: Secondary | ICD-10-CM

## 2016-08-17 DIAGNOSIS — K8042 Calculus of bile duct with acute cholecystitis without obstruction: Secondary | ICD-10-CM | POA: Diagnosis present

## 2016-08-17 DIAGNOSIS — Z951 Presence of aortocoronary bypass graft: Secondary | ICD-10-CM | POA: Diagnosis not present

## 2016-08-17 DIAGNOSIS — I25810 Atherosclerosis of coronary artery bypass graft(s) without angina pectoris: Secondary | ICD-10-CM | POA: Diagnosis not present

## 2016-08-17 DIAGNOSIS — Z85820 Personal history of malignant melanoma of skin: Secondary | ICD-10-CM

## 2016-08-17 DIAGNOSIS — Z8249 Family history of ischemic heart disease and other diseases of the circulatory system: Secondary | ICD-10-CM

## 2016-08-17 DIAGNOSIS — Z419 Encounter for procedure for purposes other than remedying health state, unspecified: Secondary | ICD-10-CM | POA: Diagnosis not present

## 2016-08-17 DIAGNOSIS — Z5309 Procedure and treatment not carried out because of other contraindication: Secondary | ICD-10-CM | POA: Diagnosis not present

## 2016-08-17 DIAGNOSIS — K810 Acute cholecystitis: Secondary | ICD-10-CM | POA: Diagnosis not present

## 2016-08-17 DIAGNOSIS — Z9889 Other specified postprocedural states: Secondary | ICD-10-CM | POA: Diagnosis not present

## 2016-08-17 DIAGNOSIS — R338 Other retention of urine: Secondary | ICD-10-CM

## 2016-08-17 DIAGNOSIS — R1084 Generalized abdominal pain: Secondary | ICD-10-CM

## 2016-08-17 LAB — CBC
HCT: 37.5 % — ABNORMAL LOW (ref 40.0–52.0)
HEMATOCRIT: 31.3 % — AB (ref 39.0–52.0)
Hemoglobin: 13 g/dL (ref 13.0–18.0)
Hemoglobin: 10.9 g/dL — ABNORMAL LOW (ref 13.0–17.0)
MCH: 30.6 pg (ref 26.0–34.0)
MCH: 30.4 pg (ref 26.0–34.0)
MCHC: 34.6 g/dL (ref 32.0–36.0)
MCHC: 34.8 g/dL (ref 30.0–36.0)
MCV: 88.5 fL (ref 80.0–100.0)
MCV: 87.2 fL (ref 78.0–100.0)
PLATELETS: 191 10*3/uL (ref 150–440)
Platelets: 122 10*3/uL — ABNORMAL LOW (ref 150–400)
RBC: 4.24 MIL/uL — ABNORMAL LOW (ref 4.40–5.90)
RBC: 3.59 MIL/uL — ABNORMAL LOW (ref 4.22–5.81)
RDW: 14.3 % (ref 11.5–14.5)
RDW: 14 % (ref 11.5–15.5)
WBC: 10.8 10*3/uL — AB (ref 3.8–10.6)
WBC: 15.1 10*3/uL — ABNORMAL HIGH (ref 4.0–10.5)

## 2016-08-17 LAB — URINALYSIS, COMPLETE (UACMP) WITH MICROSCOPIC
BACTERIA UA: NONE SEEN
BILIRUBIN URINE: NEGATIVE
GLUCOSE, UA: NEGATIVE mg/dL
Ketones, ur: NEGATIVE mg/dL
LEUKOCYTES UA: NEGATIVE
NITRITE: NEGATIVE
PH: 6 (ref 5.0–8.0)
Protein, ur: 30 mg/dL — AB
SPECIFIC GRAVITY, URINE: 1.009 (ref 1.005–1.030)
Squamous Epithelial / LPF: NONE SEEN

## 2016-08-17 LAB — COMPREHENSIVE METABOLIC PANEL
ALT: 826 U/L — ABNORMAL HIGH (ref 17–63)
ANION GAP: 12 (ref 5–15)
AST: 1249 U/L — AB (ref 15–41)
Albumin: 4.3 g/dL (ref 3.5–5.0)
Alkaline Phosphatase: 218 U/L — ABNORMAL HIGH (ref 38–126)
BILIRUBIN TOTAL: 2.8 mg/dL — AB (ref 0.3–1.2)
BUN: 14 mg/dL (ref 6–20)
CALCIUM: 9.5 mg/dL (ref 8.9–10.3)
CO2: 25 mmol/L (ref 22–32)
Chloride: 98 mmol/L — ABNORMAL LOW (ref 101–111)
Creatinine, Ser: 1.03 mg/dL (ref 0.61–1.24)
GFR calc Af Amer: >60 mL/min (ref >60)
Glucose, Bld: 186 mg/dL — ABNORMAL HIGH (ref 65–99)
POTASSIUM: 3.2 mmol/L — AB (ref 3.5–5.1)
Sodium: 135 mmol/L (ref 135–145)
TOTAL PROTEIN: 7.4 g/dL (ref 6.5–8.1)

## 2016-08-17 LAB — CREATININE, SERUM
Creatinine, Ser: 1.07 mg/dL (ref 0.61–1.24)
GFR calc Af Amer: >60 mL/min (ref >60)

## 2016-08-17 LAB — LIPASE, BLOOD: Lipase: 31 U/L (ref 11–51)

## 2016-08-17 MED ORDER — ONDANSETRON HCL 4 MG/2ML IJ SOLN: 4.0000 mg | Freq: Four times a day (QID) | INTRAMUSCULAR | Status: DC | PRN

## 2016-08-17 MED ORDER — ONDANSETRON 4 MG PO TBDP: 4.0000 mg | ORAL_TABLET | Freq: Four times a day (QID) | ORAL | Status: DC | PRN

## 2016-08-17 MED ORDER — MORPHINE SULFATE (PF) 4 MG/ML IV SOLN
2.0000 mg | INTRAVENOUS | Status: DC | PRN
  Administered 2016-08-17 – 2016-08-20 (×7): 2 mg via INTRAVENOUS
  Filled 2016-08-17 (×7): qty 1

## 2016-08-17 MED ORDER — LACTATED RINGERS IV SOLN
INTRAVENOUS | Status: DC
  Administered 2016-08-17 (×2): via INTRAVENOUS

## 2016-08-17 MED ORDER — NICOTINE 21 MG/24HR TD PT24
21.0000 mg | MEDICATED_PATCH | Freq: Every day | TRANSDERMAL | Status: DC
  Administered 2016-08-17 – 2016-08-20 (×3): 21 mg via TRANSDERMAL
  Filled 2016-08-17 (×4): qty 1

## 2016-08-17 MED ORDER — IOPAMIDOL (ISOVUE-300) INJECTION 61%
30.0000 mL | Freq: Once | INTRAVENOUS | Status: AC
  Administered 2016-08-17: 30 mL via ORAL

## 2016-08-17 MED ORDER — HYDROMORPHONE HCL 1 MG/ML IJ SOLN
1.0000 mg | Freq: Once | INTRAMUSCULAR | Status: AC
  Administered 2016-08-17: 1 mg via INTRAVENOUS

## 2016-08-17 MED ORDER — HYDRALAZINE HCL 20 MG/ML IJ SOLN: 10.0000 mg | INTRAMUSCULAR | Status: DC | PRN

## 2016-08-17 MED ORDER — PIPERACILLIN-TAZOBACTAM 3.375 G IVPB
3.3750 g | Freq: Three times a day (TID) | INTRAVENOUS | Status: DC
  Administered 2016-08-17 – 2016-08-20 (×7): 3.375 g via INTRAVENOUS
  Filled 2016-08-17 (×9): qty 50

## 2016-08-17 MED ORDER — PIPERACILLIN-TAZOBACTAM 3.375 G IVPB 30 MIN: 3.3750 g | Freq: Four times a day (QID) | INTRAVENOUS | Status: DC

## 2016-08-17 MED ORDER — KETOROLAC TROMETHAMINE 15 MG/ML IJ SOLN
15.0000 mg | Freq: Four times a day (QID) | INTRAMUSCULAR | Status: DC | PRN
  Administered 2016-08-17: 15 mg via INTRAVENOUS
  Filled 2016-08-17: qty 1

## 2016-08-17 MED ORDER — SODIUM CHLORIDE 0.9 % IV SOLN
INTRAVENOUS | Status: DC
  Administered 2016-08-17: 17:00:00 via INTRAVENOUS

## 2016-08-17 MED ORDER — ONDANSETRON HCL 4 MG/2ML IJ SOLN
4.0000 mg | Freq: Once | INTRAMUSCULAR | Status: AC
  Administered 2016-08-17: 4 mg via INTRAVENOUS
  Filled 2016-08-17: qty 2

## 2016-08-17 MED ORDER — FENTANYL CITRATE (PF) 100 MCG/2ML IJ SOLN
INTRAMUSCULAR | Status: DC
Start: 2016-08-17 — End: 2016-08-17
  Filled 2016-08-17: qty 2

## 2016-08-17 MED ORDER — PANTOPRAZOLE SODIUM 40 MG IV SOLR
40.0000 mg | INTRAVENOUS | Status: DC
  Administered 2016-08-17 – 2016-08-19 (×3): 40 mg via INTRAVENOUS
  Filled 2016-08-17 (×3): qty 40

## 2016-08-17 MED ORDER — IOPAMIDOL (ISOVUE-300) INJECTION 61%
100.0000 mL | Freq: Once | INTRAVENOUS | Status: AC | PRN
  Administered 2016-08-17: 100 mL via INTRAVENOUS

## 2016-08-17 MED ORDER — ALPRAZOLAM 0.5 MG PO TABS
1.0000 mg | ORAL_TABLET | Freq: Once | ORAL | Status: AC
  Administered 2016-08-17: 1 mg via ORAL
  Filled 2016-08-17: qty 2

## 2016-08-17 MED ORDER — DIPHENHYDRAMINE HCL 12.5 MG/5ML PO ELIX
12.5000 mg | ORAL_SOLUTION | Freq: Four times a day (QID) | ORAL | Status: DC | PRN
  Filled 2016-08-17: qty 5

## 2016-08-17 MED ORDER — PIPERACILLIN-TAZOBACTAM 3.375 G IVPB 30 MIN
3.3750 g | Freq: Once | INTRAVENOUS | Status: AC
  Administered 2016-08-17: 3.375 g via INTRAVENOUS

## 2016-08-17 MED ORDER — MORPHINE SULFATE (PF) 4 MG/ML IV SOLN
4.0000 mg | Freq: Once | INTRAVENOUS | Status: AC
  Administered 2016-08-17: 4 mg via INTRAVENOUS
  Filled 2016-08-17: qty 1

## 2016-08-17 MED ORDER — PIPERACILLIN-TAZOBACTAM 3.375 G IVPB
3.3750 g | Freq: Three times a day (TID) | INTRAVENOUS | Status: DC
  Administered 2016-08-17: 3.375 g via INTRAVENOUS
  Filled 2016-08-17 (×2): qty 50

## 2016-08-17 MED ORDER — TAMSULOSIN HCL 0.4 MG PO CAPS
0.4000 mg | ORAL_CAPSULE | Freq: Every day | ORAL | Status: DC
  Administered 2016-08-18 – 2016-08-20 (×3): 0.4 mg via ORAL
  Filled 2016-08-17 (×3): qty 1

## 2016-08-17 MED ORDER — DIPHENHYDRAMINE HCL 50 MG/ML IJ SOLN: 12.5000 mg | Freq: Four times a day (QID) | INTRAMUSCULAR | Status: DC | PRN

## 2016-08-17 MED ORDER — MORPHINE SULFATE (PF) 4 MG/ML IV SOLN
4.0000 mg | INTRAVENOUS | Status: DC | PRN
  Administered 2016-08-17: 4 mg via INTRAVENOUS
  Filled 2016-08-17: qty 1

## 2016-08-17 MED ORDER — POTASSIUM CHLORIDE 10 MEQ/100ML IV SOLN
10.0000 meq | INTRAVENOUS | Status: AC
  Administered 2016-08-17 (×2): 10 meq via INTRAVENOUS
  Filled 2016-08-17 (×2): qty 100

## 2016-08-17 MED ORDER — SODIUM CHLORIDE 0.9 % IV BOLUS (SEPSIS)
1000.0000 mL | Freq: Once | INTRAVENOUS | Status: AC
  Administered 2016-08-17: 1000 mL via INTRAVENOUS

## 2016-08-17 MED ORDER — FENTANYL CITRATE (PF) 100 MCG/2ML IJ SOLN
50.0000 ug | Freq: Once | INTRAMUSCULAR | Status: AC
  Administered 2016-08-17: 50 ug via INTRAVENOUS

## 2016-08-17 MED ORDER — PANTOPRAZOLE SODIUM 40 MG IV SOLR
40.0000 mg | Freq: Two times a day (BID) | INTRAVENOUS | Status: DC
  Administered 2016-08-17: 40 mg via INTRAVENOUS
  Filled 2016-08-17: qty 40

## 2016-08-17 MED ORDER — ENOXAPARIN SODIUM 40 MG/0.4ML ~~LOC~~ SOLN
40.0000 mg | SUBCUTANEOUS | Status: DC
  Administered 2016-08-17 – 2016-08-19 (×3): 40 mg via SUBCUTANEOUS
  Filled 2016-08-17 (×3): qty 0.4

## 2016-08-17 MED ORDER — HYDROMORPHONE HCL 1 MG/ML IJ SOLN
INTRAMUSCULAR | Status: AC
  Filled 2016-08-17: qty 1

## 2016-08-17 MED ORDER — METOPROLOL SUCCINATE ER 25 MG PO TB24
12.5000 mg | ORAL_TABLET | Freq: Every day | ORAL | Status: DC
  Administered 2016-08-18 – 2016-08-20 (×3): 12.5 mg via ORAL
  Filled 2016-08-17 (×3): qty 1

## 2016-08-17 MED ORDER — HEPARIN SODIUM (PORCINE) 5000 UNIT/ML IJ SOLN
5000.0000 [IU] | Freq: Three times a day (TID) | INTRAMUSCULAR | Status: DC
  Administered 2016-08-17 (×2): 5000 [IU] via SUBCUTANEOUS
  Filled 2016-08-17 (×2): qty 1

## 2016-08-17 NOTE — ED Triage Notes (Signed)
Pt to rm 5 via EMS from home, report LLQ pain since yesterday morning, this evening pain worsened, vomited x 6, reports diarrhea as well, pt's abdomen rigid, pt in obvious pain.

## 2016-08-17 NOTE — H&P (Signed)
History and Physical    CAYDON FEASEL HUD:[id number] DOB: 07-28-1945 DOA: 08/17/2016  Referring MD/NP/PA:   PCP: [name], MD   Patient coming from: [facility]  Chief Complaint: Abd pain , nausea vomiting , fevers and chills of 3 days in duration .  HPI: ALIE HARDGROVE is a 72 y.o. male with medical history significant of CABG/COPD who smokes one half pack every day presents to the hospital of [facility] with a chief complaint of worsening epigastric abdominal pain nausea vomiting fevers and chills for the last few days, he went to the ER there where an ultrasound was done showed cholecystitis with cholelithiasis and dilated common bile duct, CAT scan was done and showed distended gallbladder with Dilatation of the intrahepatic biliary tree and common bile duct, new compared to the prior study . The surgeon at outside facility contacted our hospitalist on call who accepted the patient to be transferred for evaluation of ERCP. The patient arrived in stable medical situation with that complaint as above.    Review of Systems: As per HPI otherwise 10 point review of systems negative.  Fevers and chills intermittently with epigastric abdominal pain nausea, denies any chest pain or shortness of breath or headache. All other systems are reviewed and are negative.   Past Medical History:  Diagnosis Date  . Anxiety   . Arthritis   . COPD (chronic obstructive pulmonary disease) ([place])    smoker  . Dysrhythmia   . Hypertension    on  no meds  . Melanoma ([place])    Resected from Left upper arm.     Past Surgical History:  Procedure Laterality Date  . BACK SURGERY  01/19/98  . CARDIAC CATHETERIZATION N/A 08/19/2015   Procedure: Left Heart Cath and Coronary Angiography;  Surgeon: [name], MD;  Location: [place] [facility];  Service: Cardiovascular;  Laterality: N/A;  . COLON SURGERY  age 4 days old   " bowel was twisted up"  . COLONOSCOPY WITH PROPOFOL N/A 03/19/2016   Procedure: COLONOSCOPY WITH PROPOFOL;  Surgeon: [name], MD;  Location: [facility] ENDOSCOPY;  Service: Endoscopy;  Laterality: N/A;  . CORONARY ARTERY BYPASS GRAFT    . EYE SURGERY Left 03/26/11  . LUMBAR LAMINECTOMY/DECOMPRESSION MICRODISCECTOMY Right 10/02/2012   Procedure: Right Lumbar Five-Sacral One Lumbar laminotomy/Microdiskectomy;  Surgeon: [name], MD;  Location: [place] [facility];  Service: Neurosurgery;  Laterality: Right;  Right Lumbar Five-Sacral One Lumbar laminotomy/Microdiskectomy     reports that he has been smoking Cigarettes.  He has a 112.00 pack-year smoking history. He has never used smokeless tobacco. He reports that he does not drink alcohol or use drugs.  Allergies  Allergen Reactions  . Other     Cheese,butter,sour cream,cottage cheese    No family history on file. HTN    Prior to Admission medications   Medication Sig Start Date End Date Taking? Authorizing Provider  metoprolol succinate (TOPROL XL) 25 MG 24 hr tablet Take 12.5 mg by mouth daily.    [provider]  tamsulosin (FLOMAX) 0.4 MG CAPS capsule Take 1 capsule (0.4 mg total) by mouth daily after breakfast. 10/03/12   Jovita Gamma, MD    Physical Exam: Vitals:   08/17/16 1542  BP: (!) 116/50  Pulse: 82  Resp: 18  Temp: 98.4 F (36.9 C)  TempSrc: Oral  SpO2: 90%  Weight: 54.7 kg (120 lb 9.5 oz)  Height: 5\' 8"  (1.727 m)      Constitutional: NAD,  calm, comfortable Vitals:   08/17/16 1542  BP: (!) 116/50  Pulse: 82  Resp: 18  Temp: 98.4 F (36.9 C)  TempSrc: Oral  SpO2: 90%  Weight: 54.7 kg (120 lb 9.5 oz)  Height: 5\' 8"  (1.727 m)   Eyes: PERRL, lids and conjunctivae normal ENMT: Mucous membranes are moist. Posterior pharynx clear of any exudate or lesions.Normal dentition.  Neck: normal, supple, no masses, no thyromegaly Respiratory: clear to auscultation bilaterally, no wheezing, no crackles. Normal respiratory effort. No accessory muscle use.    Cardiovascular: Regular rate and rhythm, no murmurs / rubs / gallops. No extremity edema. 2+ pedal pulses. No carotid bruits.  Abdomen:Epigastric tenderness with rebound, no rigidity, Murphy sign is positive, bowel sounds are decreased no distention. Musculoskeletal: no clubbing / cyanosis. No joint deformity upper and lower extremities. Good ROM, no contractures. Normal muscle tone.  Skin: no rashes, lesions, ulcers. No induration Neurologic: CN 2-12 grossly intact. Sensation intact, DTR normal. Strength 5/5 in all 4.  Psychiatric: Normal judgment and insight. Alert and oriented x 3. Normal mood.   Labs on Admission: I have personally reviewed following labs and imaging studies  CBC:  Recent Labs Lab 08/17/16 0245  WBC 10.8*  HGB 13.0  HCT 37.5*  MCV 88.5  PLT 409   Basic Metabolic Panel:  Recent Labs Lab 08/17/16 0245  NA 135  K 3.2*  CL 98*  CO2 25  GLUCOSE 186*  BUN 14  CREATININE 1.03  CALCIUM 9.5   GFR: Estimated Creatinine Clearance: 50.9 mL/min (by C-G formula based on SCr of 1.03 mg/dL). Liver Function Tests:  Recent Labs Lab 08/17/16 0245  AST 1,249*  ALT 826*  ALKPHOS 218*  BILITOT 2.8*  PROT 7.4  ALBUMIN 4.3    Recent Labs Lab 08/17/16 0245  LIPASE 31   No results for input(s): AMMONIA in the last 168 hours. Coagulation Profile: No results for input(s): INR, PROTIME in the last 168 hours. Cardiac Enzymes: No results for input(s): CKTOTAL, CKMB, CKMBINDEX, TROPONINI in the last 168 hours. BNP (last 3 results) No results for input(s): PROBNP in the last 8760 hours. HbA1C: No results for input(s): HGBA1C in the last 72 hours. CBG: No results for input(s): GLUCAP in the last 168 hours. Lipid Profile: No results for input(s): CHOL, HDL, LDLCALC, TRIG, CHOLHDL, LDLDIRECT in the last 72 hours. Thyroid Function Tests: No results for input(s): TSH, T4TOTAL, FREET4, T3FREE, THYROIDAB in the last 72 hours. Anemia Panel: No results for input(s):  VITAMINB12, FOLATE, FERRITIN, TIBC, IRON, RETICCTPCT in the last 72 hours. Urine analysis:    Component Value Date/Time   COLORURINE YELLOW (A) 08/17/2016 0533   APPEARANCEUR CLEAR (A) 08/17/2016 0533   LABSPEC 1.009 08/17/2016 0533   PHURINE 6.0 08/17/2016 0533   GLUCOSEU NEGATIVE 08/17/2016 0533   HGBUR MODERATE (A) 08/17/2016 0533   BILIRUBINUR NEGATIVE 08/17/2016 0533   KETONESUR NEGATIVE 08/17/2016 0533   PROTEINUR 30 (A) 08/17/2016 0533   NITRITE NEGATIVE 08/17/2016 0533   LEUKOCYTESUR NEGATIVE 08/17/2016 0533   Sepsis Labs: @LABRCNTIP (procalcitonin:4,lacticidven:4) )No results found for this or any previous visit (from the past 240 hour(s)).   Radiological Exams on Admission: Ct Abdomen Pelvis W Contrast  Result Date: 08/17/2016 CLINICAL DATA:  71 year old male with lower abdominal pain. EXAM: CT ABDOMEN AND PELVIS WITH CONTRAST TECHNIQUE: Multidetector CT imaging of the abdomen and pelvis was performed using the standard protocol following bolus administration of intravenous contrast. CONTRAST:  <See Chart> ISOVUE-300 IOPAMIDOL (ISOVUE-300) INJECTION 61%, 183mL ISOVUE-300 IOPAMIDOL (ISOVUE-300)  INJECTION 61% COMPARISON:  Abdominal CT dated 12/16/2015 FINDINGS: Evaluation is very limited due to paucity of intra-abdominal fat. Lower chest: The visualized lung bases are clear. No intra-abdominal free fluid. A pocket of air in the anterior mid abdomen (series 2 image 42) is most likely intraluminal and represents air in the cecum. Although evaluation is very limited due to paucity of intra-abdominal fat. Pneumoperitoneum is much less likely. Correlation with clinical exam recommended. Hepatobiliary: There is moderate dilatation of the intrahepatic biliary tree, new compared to the prior CT. There is dilatation of the common bile duct measuring up to 18 mm with dilatation of the central CBD at the head of the pancreas measuring up to 11 mm. This is new compared to the prior CT. No calcified  stone identified within the CBD. An ampullary lesion is not entirely excluded. Further evaluation with MRCP is recommended. The gallbladder is moderately distended. No calcified stone is seen within the gallbladder on the CT. Small stones noted on today's ultrasound. Clinical correlation is recommended. A hepatobiliary scintigraphy may provide better evaluation of the gallbladder if there is a high clinical concern for acute cholecystitis . The liver is unremarkable. Pancreas: Unremarkable. No pancreatic ductal dilatation or surrounding inflammatory changes. Spleen: Normal in size without focal abnormality. Adrenals/Urinary Tract: The adrenal glands are unremarkable. Subcentimeter bilateral renal hypodense lesions are too small to characterize but most likely represent cysts. There is no hydronephrosis on either side. The visualized ureters appear unremarkable. The urinary bladder is distended. Stomach/Bowel: Evaluation of the bowel is limited due to paucity of the intra-abdominal fat. The stomach is distended. Oral contrast is noted within the stomach and loops of small bowel. The colon is collapsed. Diffusely thickened appearance of the small bowel and colon may be related to underdistention. Enteritis or enterocolitis is not excluded. Clinical correlation is recommended. There is no evidence of bowel obstruction. The appendix is normal. Vascular/Lymphatic: There is advanced aortoiliac atherosclerotic disease. The aorta is ectatic. The origins of the celiac axis, SMA, and IMA appear patent. The SMV, splenic vein, and main portal vein are patent. No portal venous gas identified. There is no adenopathy. Reproductive: The prostate and seminal vesicles are grossly unremarkable. Other: There is loss of subcutaneous fat and cachexia. Musculoskeletal: Degenerative changes of the spine. No acute fracture. IMPRESSION: 1. Distended gallbladder. No calcified stone seen on this CT. Stones however seen on today's ultrasound. A  hepatobiliary scintigraphy may provide better evaluation of the gallbladder if there is a high clinical concern for acute cholecystitis . 2. Dilatation of the intrahepatic biliary tree and common bile duct, new compared to the prior study of indeterminate etiology. An ampullary lesion is not excluded. Further evaluation with MRCP recommended. 3. Distended stomach without evidence of mechanical gastric outlet obstruction. Findings may represent functional decreased peristalsis/gastroparesis. 4. No evidence of small-bowel obstruction. Thickened appearance of the small bowel and colon likely related to underdistention. Enterocolitis is not entirely excluded. 5. Pocket of air in the anterior mid abdomen likely intraluminal and air within the cecum. 6.  Aortic Atherosclerosis (ICD10-I70.0). Electronically Signed   By: Anner Crete M.D.   On: 08/17/2016 05:30   US Abdomen Limited Ruq  Result Date: 08/17/2016 CLINICAL DATA:  Abdominal pain and gallstones EXAM: ULTRASOUND ABDOMEN LIMITED RIGHT UPPER QUADRANT COMPARISON:  None. FINDINGS: Gallbladder: The gallbladder is distended with multiple stones that measure up to 8 mm. There is minimal wall thickening. A positive sonographic Percell Miller sign was demonstrated by the sonographer. No pericholecystic fluid. Common bile  duct: Diameter: 14.3 mm, dilated. Liver: Moderate intrahepatic biliary dilatation. Liver parenchyma is normal. IMPRESSION: 1. Positive sonographic Murphy sign and cholelithiasis, suggesting acute cholecystitis. 2. Dilated common bile duct and moderate intrahepatic biliary dilatation, but no choledocholithiasis is visible on this study. Electronically Signed   By: Ulyses Jarred M.D.   On: 08/17/2016 05:07    EKG: Independently reviewed.   Assessment/Plan   1-Acute cholecystitis with intrahepatic biliary duct dilation:  Patient is NPO, already consulted GI/surgery on call, keep nothing by mouth ,continue  IV fluids /  Zosyn and pain control patient  mostly will need ERCP soon  for possible stones versus malignancy.  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  3-history of smoking 1-1/2 pack every day : counseling provided the patient is provided with nicotine patch   DVT prophylaxis: SCD  Code Status: FULL Family Communication:NONE  Disposition Plan: HOME Consults called: GI/[facility] and surgery . Admission status:inpatient   Waldron Session MD [facility] Pager [phone]  If 7PM-7AM, please contact night-coverage [email or web address] Password [email or web address]  08/17/2016, 5:20 PM

## 2016-08-17 NOTE — Progress Notes (Signed)
SURGICAL PROGRESS NOTE (cpt [phone])  Hospital Day(s): 0.   Post op day(s):  Marland Kitchen   Interval History: Patient seen and examined, reports pain overall controlled since admission overnight. Patient otherwise reports +flatus, baseline dyspnea with even mild exertion, still smoking 2 ppd s/p CABG August, 2017, denies fever/chills, N/V, or CP.  Review of Systems:  Constitutional: denies fever, chills  HEENT: denies cough or congestion  Respiratory: denies any shortness of breath  Cardiovascular: denies chest pain or palpitations  Gastrointestinal: abdominal pain, N/V, and bowel function as per interval history Genitourinary: denies burning with urination or urinary frequency Musculoskeletal: denies pain, decreased motor or sensation Integumentary: denies any other rashes or skin discolorations Neurological: denies HA or vision/hearing changes   Vital signs in last 24 hours: [min-max] current  Temp:  [98.4 F (36.9 C)-98.7 F (37.1 C)] 98.7 F (37.1 C) (07/20 1914) Pulse Rate:  [97-108] 102 (07/20 0632) Resp:  [20-31] 29 (07/20 0600) BP: (139-175)/(66-95) 145/66 (07/20 0632) SpO2:  [84 %-96 %] 95 % (07/20 7829) Weight:  [120 lb (54.4 kg)] 120 lb (54.4 kg) (07/20 0244)     Height: 5\' 8"  (172.7 cm) Weight: 120 lb (54.4 kg) BMI (Calculated): 18.3   Intake/Output this shift:  Total I/O In: 226 [I.V.:226] Out: -    Intake/Output last 2 shifts:  @IOLAST2SHIFTS @   Physical Exam:  Constitutional: alert, cooperative and no distress  HENT: normocephalic without obvious abnormality  Eyes: PERRL, EOM's grossly intact and symmetric  Neuro: CN II - XII grossly intact and symmetric without deficit  Respiratory: breathing non-labored at rest  Cardiovascular: regular rate and sinus rhythm  Gastrointestinal: soft and non-distended with moderate RUQ abdominal tenderness to palpation, no guarding or rebound tenderness, well-healed Right-side vertical incision  Musculoskeletal: UE and LE FROM, no  edema or wounds, motor and sensation grossly intact, NT   Labs:  CBC Latest Ref Rng & Units 08/17/2016 09/26/2012  WBC 3.8 - 10.6 K/uL 10.8(H) 9.4  Hemoglobin 13.0 - 18.0 g/dL 13.0 13.0  Hematocrit 40.0 - 52.0 % 37.5(L) 35.8(L)  Platelets 150 - 440 K/uL 191 237   CMP Latest Ref Rng & Units 08/17/2016 09/26/2012  Glucose 65 - 99 mg/dL 186(H) 114(H)  BUN 6 - 20 mg/dL 14 10  Creatinine 0.61 - 1.24 mg/dL 1.03 0.86  Sodium 135 - 145 mmol/L 135 127(L)  Potassium 3.5 - 5.1 mmol/L 3.2(L) 4.6  Chloride 101 - 111 mmol/L 98(L) 92(L)  CO2 22 - 32 mmol/L 25 25  Calcium 8.9 - 10.3 mg/dL 9.5 9.3  Total Protein 6.5 - 8.1 g/dL 7.4 -  Total Bilirubin 0.3 - 1.2 mg/dL 2.8(H) -  Alkaline Phos 38 - 126 U/L 218(H) -  AST 15 - 41 U/L 1,249(H) -  ALT 17 - 63 U/L 826(H) -   Imaging studies: No new pertinent imaging studies   Assessment/Plan: (ICD-10's: K80.43) 71 y.o. male with acute calculous cholecystitis and hyperbilirubinemia and elevated AP + transaminases, along with 14 mm CBC, attributed to choledocholithiasis and complicated by pertinent comorbidities including HTN, CAD s/p CABG (2017), cardiac dysrhythmia (not specified), COPD with shortness of breath upon mild exertion and ongoing chronic heavy tobacco use/abuse, prior Right-side open abdominal surgery for internal hernia at 40 days old, LUE melanoma s/p resection, and generalized anxiety disorder.   - NPO, IVF  - IV antibiotics  - pre-operative cardiology risk stratification and optimization appreciated  - transfer center contacted to initiate transfer to [facility] for ERCP (not currently available at [facility])  - discussed  patient with hospitalist Dr. Nehemiah Settle and GI on-call, who agree to accept patient and consultation, respectively  - medical management of comorbidities  - DVT prophylaxis  All of the above findings and recommendations were discussed with the patient and his RN, and all of patient's questions were answered to his expressed  satisfaction.  -- Marilynne Drivers Rosana Hoes, MD, [place]: [facility] - Partnering for exceptional care. Office: [phone]

## 2016-08-17 NOTE — Progress Notes (Signed)
Inpatient Diabetes Program Recommendations  AACE/ADA: New Consensus Statement on Inpatient Glycemic Control (2015)  Target Ranges:  Prepandial:   less than 140 mg/dL      Peak postprandial:   less than 180 mg/dL (1-2 hours)      Critically ill patients:  140 - 180 mg/dL  Results for KOBEN, DAMAN (MRN [id number]) as of 08/17/2016 12:22  Ref. Range 08/17/2016 02:45  Glucose Latest Ref Range: 65 - 99 mg/dL 186 (H)    Review of Glycemic Control  Diabetes history: No Outpatient Diabetes medications: NA Current orders for Inpatient glycemic control: None  Inpatient Diabetes Program Recommendations: Correction (SSI): Lab glucose 186 mg/dl at 2:45 am today. May want to consider ordering CBGs with Novolog 0-9 units TID with meals while inpatient. HgbA1C: Please consider ordering an A1C to evaluate glycemic control over the past 2-3 months.  Thanks, Barnie Alderman, RN, MSN, CDE Diabetes Coordinator Inpatient Diabetes Program [phone] (Team Pager from 8am to 5pm)

## 2016-08-17 NOTE — Progress Notes (Signed)
Pt transferred to [facility] via [facility].

## 2016-08-17 NOTE — ED Notes (Signed)
Pt taken to [facility]

## 2016-08-17 NOTE — H&P (Addendum)
Patient ID: Todd Whitehead, male   DOB: 07/07/1945, 71 y.o.   MRN: [id number]  HPI Todd Whitehead is a 71 y.o. male with multiple comorbidities including COPD, coronary artery disease status post CABG last year. He comes into the emergency room with right upper quadrant pain that started a few days ago and exacerbated over last 24 hours. He reports the pain is constant, sharp and moderate to severe. As I stated nausea and vomiting. He also has dysuria. He does report some fevers and chills. Denies any evidence of jaundice or biliary obstruction. 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 He also had a CABG less than a year ago His white count is 10.8, AST is 1249 ALT is 826 alk phosphatase 218 and tbili is 2.8  Ultrasound personally reviewed there is evidence of dilated common bile duct To 14 mm. There is a distended gallbladder with multiple gallstones, CT scan also personally reviewed revealing distended gallbladder and common bile duct dilation. No evidence of pancreatic masses or lesions to suggest neoplastic process  I just spoke Dr. Allen Whitehead and he was on call last night but is out of town today. Will get MRCP if positive needs to be transferred.    Past Medical History:  Diagnosis Date  . Anxiety   . Arthritis   . COPD (chronic obstructive pulmonary disease) ([place])    smoker  . Dysrhythmia   . Hypertension    on  no meds  . Melanoma ([place])    Resected from Left upper arm.     Past Surgical History:  Procedure Laterality Date  . BACK SURGERY  01/19/98  . CARDIAC CATHETERIZATION N/A 08/19/2015   Procedure: Left Heart Cath and Coronary Angiography;  Surgeon: Todd Spray, MD;  Location: [place] [facility];  Service: Cardiovascular;  Laterality: N/A;  . COLON SURGERY  age 41 days old   " bowel was twisted up"  . COLONOSCOPY WITH PROPOFOL N/A 03/19/2016   Procedure: COLONOSCOPY WITH PROPOFOL;  Surgeon: Todd Sails, MD;  Location: [facility] ENDOSCOPY;   Service: Endoscopy;  Laterality: N/A;  . CORONARY ARTERY BYPASS GRAFT    . EYE SURGERY Left 03/26/11  . LUMBAR LAMINECTOMY/DECOMPRESSION MICRODISCECTOMY Right 10/02/2012   Procedure: Right Lumbar Five-Sacral One Lumbar laminotomy/Microdiskectomy;  Surgeon: Todd Spangle, MD;  Location: [place] [facility]URO ORS;  Service: Neurosurgery;  Laterality: Right;  Right Lumbar Five-Sacral One Lumbar laminotomy/Microdiskectomy    History reviewed. No pertinent family history.  Social History Social History  Substance Use Topics  . Smoking status: Current Every Day Smoker    Packs/day: 2.00    Years: 56.00    Types: Cigarettes  . Smokeless tobacco: Never Used  . Alcohol use No    Allergies  Allergen Reactions  . Other     Cheese,butter,sour cream,cottage cheese    Current Facility-Administered Medications  Medication Dose Route Frequency Provider Last Rate Last Dose  . diphenhydrAMINE (BENADRYL) 12.5 MG/5ML elixir 12.5 mg  12.5 mg Oral Q6H PRN Todd Whitehead F, MD       Or  . diphenhydrAMINE (BENADRYL) injection 12.5 mg  12.5 mg Intravenous Q6H PRN Todd Damian F, MD      . heparin injection 5,000 Units  5,000 Units Subcutaneous Q8H Todd Husbands, MD   5,000 Units at 08/17/16 [phone]  . hydrALAZINE (APRESOLINE) injection 10 mg  10 mg Intravenous Q2H PRN Todd Whitehead F, MD      . ketorolac (TORADOL) 15 MG/ML  injection 15 mg  15 mg Intravenous Q6H PRN Todd Whitehead F, MD      . lactated ringers infusion   Intravenous Continuous Todd Husbands, MD 150 mL/hr at 08/17/16 [phone]    . morphine 4 MG/ML injection 4 mg  4 mg Intravenous Q2H PRN Todd Whitehead F, MD      . ondansetron (ZOFRAN-ODT) disintegrating tablet 4 mg  4 mg Oral Q6H PRN Todd Whitehead F, MD       Or  . ondansetron (ZOFRAN) injection 4 mg  4 mg Intravenous Q6H PRN Todd Whitehead F, MD      . pantoprazole (PROTONIX) injection 40 mg  40 mg Intravenous Q12H Todd Whitehead F, MD      . piperacillin-tazobactam (ZOSYN) IVPB 3.375 g  3.375 g Intravenous Q8H  Todd Whitehead F, MD         Review of Systems Full ROS  was asked and was negative except for the information on the HPI  Physical Exam Blood pressure (!) 145/66, pulse (!) 102, temperature 98.7 Whitehead (37.1 C), temperature source Oral, resp. rate (!) 29, height _0  (1.727 m), weight 54.4 kg (120 lb), SpO2 95 %. CONSTITUTIONAL: NAD, thin male EYES: Pupils are equal, round, and reactive to light, Sclera are non-icteric. EARS, NOSE, MOUTH AND THROAT: The oropharynx is clear. The oral mucosa is pink and moist. Hearing is intact to voice. LYMPH NODES:  Lymph nodes in the neck are normal. RESPIRATORY:  Lungs are clear. There is normal respiratory effort, with equal breath sounds bilaterally, and without pathologic use of accessory muscles. CARDIOVASCULAR: Heart is regular without murmurs, gallops, or rubs. GI: The abdomen is soft, TTP RUQ w + murphy sign, no peritonitis, no masses GU: Rectal deferred.   MUSCULOSKELETAL: Normal muscle strength and tone. No cyanosis or edema.   SKIN: Turgor is good and there are no pathologic skin lesions or ulcers. NEUROLOGIC: Motor and sensation is grossly normal. Cranial nerves are grossly intact. PSYCH:  Oriented to person, place and time. Affect is normal.  Data Reviewed  I have personally reviewed the patient's imaging, laboratory findings and medical records.    Assessment Plan 71 year old male with multiple medical issues including COPD and coronary artery disease status post CABG now comes in with acute cholecystitis and likely common bile duct stone. We will admit him to the hospital place him on IV fluids and start IV antibiotics. We'll also discuss with GI for an ERCP since his bilirubin is As well as his LFTs suggesting beery obstructive process. He will eventually will need a cholecystectomy but first we need to clear the common bile duct first. He is not toxic and not septic and does not need any emergent procedures at this time. Discussed with the  patient in detail about my thought process and about course of hospitalization. He understands. We'll also obtain a cardiology consultation given his dyspnea on exertion and significant coronary artery disease history. HE has a hx of urinary retention and will keep foley,.  I just spoke o  Caroleen Hamman, MD FACS General Surgeon 08/17/2016, 6:41 AM

## 2016-08-17 NOTE — ED Provider Notes (Signed)
[facility] Emergency Department Provider Note  Time seen: 2:50 AM  I have reviewed the triage vital signs and the nursing notes.   HISTORY  Chief Complaint Abdominal Pain    HPI Todd Whitehead is a 71 y.o. male with a past medical history of COPD, hypertension, CABG, presents to the emergency department for lower abdominal pain nausea vomiting and dysuria. According to the patient since this morning he has been spending lower abdominal pain with nausea, 6 episodes of vomiting. He also states a burning sensation when he urinates since this morning as well. Denies any blood in his urine. Denies any black or bloody stool known to the patient. States chills but denies any fever. Describes the pain as moderate aching pain in the lower abdomen.  Past Medical History:  Diagnosis Date  . Anxiety   . Arthritis   . COPD (chronic obstructive pulmonary disease) ([place])    smoker  . Dysrhythmia   . Hypertension    on  no meds  . Melanoma ([place])    Resected from Left upper arm.     Patient Active Problem List   Diagnosis Date Noted  . Personal history of tobacco use, presenting hazards to health 07/08/2016    Past Surgical History:  Procedure Laterality Date  . BACK SURGERY  01/19/98  . CARDIAC CATHETERIZATION N/A 08/19/2015   Procedure: Left Heart Cath and Coronary Angiography;  Surgeon: [name], MD;  Location: [facility];  Service: Cardiovascular;  Laterality: N/A;  . COLON SURGERY  age 23 days old   " bowel was twisted up"  . COLONOSCOPY WITH PROPOFOL N/A 03/19/2016   Procedure: COLONOSCOPY WITH PROPOFOL;  Surgeon: [name], MD;  Location: [facility] ENDOSCOPY;  Service: Endoscopy;  Laterality: N/A;  . CORONARY ARTERY BYPASS GRAFT    . EYE SURGERY Left 03/26/11  . LUMBAR LAMINECTOMY/DECOMPRESSION MICRODISCECTOMY Right 10/02/2012   Procedure: Right Lumbar Five-Sacral One Lumbar laminotomy/Microdiskectomy;  Surgeon: [name], MD;  Location:  [place] [facility];  Service: Neurosurgery;  Laterality: Right;  Right Lumbar Five-Sacral One Lumbar laminotomy/Microdiskectomy    Prior to Admission medications   Medication Sig Start Date End Date Taking? Authorizing Provider  ALPRAZolam Duanne Moron) 1 MG tablet Take 1 mg by mouth 3 (three) times daily as needed for sleep.    [provider]  aspirin EC 81 MG tablet Take 81 mg by mouth daily.    [provider]  brimonidine (ALPHAGAN) 0.15 % ophthalmic solution Place 1 drop into both eyes 2 (two) times daily.    [provider]  busPIRone (BUSPAR) 30 MG tablet Take 30 mg by mouth 2 (two) times daily.    [provider]  HYDROcodone-acetaminophen (NORCO/VICODIN) 5-325 MG per tablet Take 1 tablet by mouth every 4 (four) hours as needed for pain.    [provider]  latanoprost (XALATAN) 0.005 % ophthalmic solution Place 1 drop into both eyes at bedtime.    [provider]  metoprolol succinate (TOPROL XL) 25 MG 24 hr tablet Take 12.5 mg by mouth daily.    [provider]  naproxen sodium (ANAPROX) 220 MG tablet Take 440 mg by mouth as needed (for pain.).    [provider]  nitroGLYCERIN (NITROSTAT) 0.6 MG SL tablet Place 0.6 mg under the tongue every 5 (five) minutes as needed for chest pain.    [provider]  oxyCODONE-acetaminophen (PERCOCET/ROXICET) 5-325 MG per tablet Take 1-2 tablets by mouth every 4 (four)  hours as needed. Patient not taking: Reported on 08/19/2015 10/03/12   Jovita Gamma, MD  tamsulosin [facility]) 0.4 MG CAPS capsule Take 1 capsule (0.4 mg total) by mouth daily after breakfast. 10/03/12   Jovita Gamma, MD  traMADol (ULTRAM) 50 MG tablet Take 50 mg by mouth 2 (two) times daily.    [provider]    Allergies  Allergen Reactions  . Other     Cheese,butter,sour cream,cottage cheese    History reviewed. No pertinent family history.  Social History Social History  Substance Use Topics   . Smoking status: Current Every Day Smoker    Packs/day: 2.00    Years: 56.00    Types: Cigarettes  . Smokeless tobacco: Never Used  . Alcohol use No    Review of Systems Constitutional: Negative for fever. Cardiovascular: Negative for chest pain. Respiratory: Negative for shortness of breath. Gastrointestinal: Lower abdominal pain. Positive for nausea and vomiting. Negative diarrhea. Genitourinary: Burning dysuria since this morning. Musculoskeletal: Negative for back pain Neurological: Negative for headache All other ROS negative  ____________________________________________   PHYSICAL EXAM:  VITAL SIGNS: ED Triage Vitals  Enc Vitals Group     BP 08/17/16 0248 (!) 175/88     Pulse Rate 08/17/16 0248 (!) 108     Resp 08/17/16 0248 (!) 23     Temp 08/17/16 0248 98.4 F (36.9 C)     Temp Source 08/17/16 0248 Oral     SpO2 08/17/16 0248 95 %     Weight 08/17/16 0244 120 lb (54.4 kg)     Height 08/17/16 0244 5\' 8"  (1.727 m)     Head Circumference --      Peak Flow --      Pain Score 08/17/16 0244 10     Pain Loc --      Pain Edu? --      Excl. in [place]? --     Constitutional: Alert and oriented. Mild distress due to pain. Eyes: Normal exam ENT   Head: Normocephalic and atraumatic.   Mouth/Throat: Mucous membranes are moist. Cardiovascular: Normal rate, regular rhythm. No murmur Respiratory: Normal respiratory effort without tachypnea nor retractions. Breath sounds are clear  Gastrointestinal: Soft, moderate lower abdominal tenderness across entire lower abdomen. No rebound or guarding. No distention. Musculoskeletal: Nontender with normal range of motion in all extremities. Neurologic:  Normal speech and language. No gross focal neurologic deficits Skin:  Skin is warm, dry and intact.  Psychiatric: Mood and affect are normal.    RADIOLOGY  Ultrasound positive for acute cholecystitis with moderate CBD  dilation.  ____________________________________________   INITIAL IMPRESSION / ASSESSMENT AND PLAN / ED COURSE  Pertinent labs & imaging results that were available during my care of the patient were reviewed by me and considered in my medical decision making (see chart for details).  Patient presents the emergency department for lower abdominal pain since this morning along with nausea vomiting and dysuria. We will check labs, urinalysis, treat with pain and nausea medication obtain a CT scan to further evaluate. Patient agreeable to plan.  EKG reviewed and interpreted by myself shows sinus tachycardia 106 bpm, widened QRS, normal axis, normal intervals, nonspecific ST changes.  Ultrasound positive for acute cholecystitis. Discussed the patient with general surgery who will review imaging and be down to see the patient. Patient's bladder scan has resulted greater than 900. Patient attempting to urinate, unable to do so successfully we will likely place a Foley catheter.  Patient unable to urinate. Foley  catheter placed. CT and ultrasound have resulted. Gen. surgery will be down to see the patient. ____________________________________________   FINAL CLINICAL IMPRESSION(S) / ED DIAGNOSES  Abdominal pain Acute cholecystitis Acute urinary retention   Harvest Dark, MD 08/17/16 [phone]

## 2016-08-17 NOTE — Progress Notes (Signed)
Initial Nutrition Assessment  DOCUMENTATION CODES:   Severe malnutrition in context of chronic illness  INTERVENTION:   RD will order snacks and supplements when diet advanced. Pt does not like Ensure.   NUTRITION DIAGNOSIS:   Malnutrition (severe) related to  (COPD and heart disease ) as evidenced by severe depletion of body fat, severe depletion of muscle mass.  GOAL:   Patient will meet greater than or equal to 90% of their needs  MONITOR:   PO intake, Supplement acceptance, Labs, Weight trends  REASON FOR ASSESSMENT:   Malnutrition Screening Tool    ASSESSMENT:   71 year old male with multiple medical issues including COPD and coronary artery disease status post CABG now comes in with acute cholecystitis and likely common bile duct stone.   Met with pt in room today. Pt reports intermittent poor appetite, nausea, and diarrhea for 3 weeks. Pt also reports some vomiting for several days pta. Per chart, pt is weight stable. Pt is currently NPO for possible ERCP/surgery today. When pt's diet has advanced, RD will order snacks and supplements. Pt reports that he can not eat cheese, yogurt, butter, or ice cream because it "runs right through him". Pt can drink milk and he also enjoys peanut butter and chicken salad. Pt does not like Ensure. Pt with hypokalemia; monitor and supplement as needed per MD discretion.    Medications reviewed and include: heparin, protonix, zosyn, morphine  Labs reviewed: K 3.2(L), Cl 98(L), alk phos 218(H), AST 1249(H), ALT 826(H), tbili 2.8(H) Wbc- 10.8(H)  Nutrition-Focused physical exam completed. Findings are severe fat depletion in arms and chest, severe muscle depletions over entire body, and no edema.   Diet Order:  Diet NPO time specified Except for: Ice Chips  Skin:  Reviewed, no issues  Last BM:  7/19  Height:   Ht Readings from Last 1 Encounters:  08/17/16 '5\' 8"'$  (1.727 m)    Weight:   Wt Readings from Last 1 Encounters:   08/17/16 126 lb 1.6 oz (57.2 kg)    Ideal Body Weight:  70 kg  BMI:  Body mass index is 19.17 kg/m.  Estimated Nutritional Needs:   Kcal:  1700-2000kcal/day   Protein:  86-97g/day   Fluid:  >1.7L/day   EDUCATION NEEDS:   Education needs addressed  Koleen Distance MS, RD, LDN Pager #[phone] After Hours Pager: [phone]

## 2016-08-17 NOTE — ED Notes (Signed)
Pt's o2sat noted to be 84% on RA, pt placed on 2L oxygen via [place], pt reports his oxygen tends to run low

## 2016-08-17 NOTE — Consult Note (Signed)
[facility] Cardiology  CARDIOLOGY CONSULT NOTE  Patient ID: STEPHAN NELIS MRN: [id number] DOB/AGE: 71/30/1947 71 y.o.  Admit date: 08/17/2016 Referring Physician [place] Primary Physician [facility] Primary Cardiologist Fath Reason for Consultation Pre-operative evaluation  HPI: 71 year old male referred for pre-operative evaluation for ERCP. The patient has a history of coronary artery disease status post CABG 2 in 08/2015, tobacco abuse since the age of 59, and COPD. The patient presented to [facility] ER this morning with right upper quadrant pain, found to have acute cholecystitis and possible bile duct stone. The patient denies exertional chest pain, reports occasional substernal chest discomfort that he describes as soreness that is brief in nature. He has chronic exertional shortness of breath with underlying COPD and ongoing tobacco abuse. He denies peripheral edema. He denies experiencing palpitations, presyncope, or syncope. Most recent 2-D echocardiogram on 10/11/2015 revealed normal left ventricular function with LVEF greater than 55% with mild mitral regurgitation.   Review of systems complete and found to be negative unless listed above     Past Medical History:  Diagnosis Date  . Anxiety   . Arthritis   . COPD (chronic obstructive pulmonary disease) ([place])    smoker  . Dysrhythmia   . Hypertension    on  no meds  . Melanoma ([place])    Resected from Left upper arm.     Past Surgical History:  Procedure Laterality Date  . BACK SURGERY  01/19/98  . CARDIAC CATHETERIZATION N/A 08/19/2015   Procedure: Left Heart Cath and Coronary Angiography;  Surgeon: [name], MD;  Location: [place] CV LAB;  Service: Cardiovascular;  Laterality: N/A;  . COLON SURGERY  age 89 days old   " bowel was twisted up"  . COLONOSCOPY WITH PROPOFOL N/A 03/19/2016   Procedure: COLONOSCOPY WITH PROPOFOL;  Surgeon: [name], MD;  Location: [facility] ENDOSCOPY;  Service: Endoscopy;  Laterality: N/A;  .  CORONARY ARTERY BYPASS GRAFT    . EYE SURGERY Left 03/26/11  . LUMBAR LAMINECTOMY/DECOMPRESSION MICRODISCECTOMY Right 10/02/2012   Procedure: Right Lumbar Five-Sacral One Lumbar laminotomy/Microdiskectomy;  Surgeon: [name], MD;  Location: [place] NEURO ORS;  Service: Neurosurgery;  Laterality: Right;  Right Lumbar Five-Sacral One Lumbar laminotomy/Microdiskectomy    Prescriptions Prior to Admission  Medication Sig Dispense Refill Last Dose  . ALPRAZolam (XANAX) 1 MG tablet Take 1 mg by mouth 3 (three) times daily as needed for sleep.   03/18/2016 at Unknown time  . aspirin EC 81 MG tablet Take 81 mg by mouth daily.   03/18/2016 at Unknown time  . atorvastatin (LIPITOR) 40 MG tablet Take 40 mg by mouth daily.     . busPIRone (BUSPAR) 30 MG tablet Take 30 mg by mouth 2 (two) times daily.   03/18/2016 at Unknown time  . citalopram (CELEXA) 10 MG tablet Take 10 mg by mouth daily.     . ferrous sulfate 325 (65 FE) MG tablet Take 325 mg by mouth daily with breakfast.     . furosemide (LASIX) 40 MG tablet Take 40 mg by mouth daily.     . metoprolol succinate (TOPROL XL) 25 MG 24 hr tablet Take 12.5 mg by mouth daily.   03/19/2016 at 0600  . potassium chloride SA (K-DUR,KLOR-CON) 20 MEQ tablet Take 20 mEq by mouth daily.     . traMADol (ULTRAM) 50 MG tablet Take 50 mg by mouth 2 (two) times daily.   08/19/2015 at 0500  . brimonidine (ALPHAGAN) 0.15 % ophthalmic solution Place 1 drop  into both eyes 2 (two) times daily.   03/19/2016 at Unknown time  . HYDROcodone-acetaminophen (NORCO/VICODIN) 5-325 MG per tablet Take 1 tablet by mouth every 4 (four) hours as needed for pain.   Not Taking at Unknown time  . latanoprost (XALATAN) 0.005 % ophthalmic solution Place 1 drop into both eyes at bedtime.   03/18/2016 at Unknown time  . naproxen sodium (ANAPROX) 220 MG tablet Take 440 mg by mouth as needed (for pain.).   03/18/2016 at Unknown time  . nitroGLYCERIN (NITROSTAT) 0.6 MG SL tablet Place 0.6 mg under the  tongue every 5 (five) minutes as needed for chest pain.   08/19/2015 at 0600  . oxyCODONE-acetaminophen (PERCOCET/ROXICET) 5-325 MG per tablet Take 1-2 tablets by mouth every 4 (four) hours as needed. (Patient not taking: Reported on 08/19/2015) 60 tablet 0 Not Taking at Unknown time  . tamsulosin (FLOMAX) 0.4 MG CAPS capsule Take 1 capsule (0.4 mg total) by mouth daily after breakfast. 30 capsule 1 08/19/2015 at 0500   Social History   Social History  . Marital status: Divorced    Spouse name: N/A  . Number of children: N/A  . Years of education: N/A   Occupational History  . Not on file.   Social History Main Topics  . Smoking status: Current Every Day Smoker    Packs/day: 2.00    Years: 56.00    Types: Cigarettes  . Smokeless tobacco: Never Used  . Alcohol use No  . Drug use: No  . Sexual activity: Not Currently   Other Topics Concern  . Not on file   Social History Narrative  . No narrative on file    History reviewed. No pertinent family history.    Review of systems complete and found to be negative unless listed above      PHYSICAL EXAM  General:  in no acute distress HEENT:  Normocephalic and atramatic Neck:  No JVD.  Lungs: Clear bilaterally to auscultation. Normal effort of breathing on supplemental O2. No wheezing or crackles Heart: HRRR . Normal S1 and S2 without gallops or murmurs.  Abdomen: Bowel sounds are positive, abdomen soft, tender Msk:  Back normal, lying in bed. Extremities: No clubbing, cyanosis or edema.   Neuro: Alert and oriented X 3. Psych:  Good affect, responds appropriately  Labs:   Lab Results  Component Value Date   WBC 10.8 (H) 08/17/2016   HGB 13.0 08/17/2016   HCT 37.5 (L) 08/17/2016   MCV 88.5 08/17/2016   PLT 191 08/17/2016    Recent Labs Lab 08/17/16 0245  NA 135  K 3.2*  CL 98*  CO2 25  BUN 14  CREATININE 1.03  CALCIUM 9.5  PROT 7.4  BILITOT 2.8*  ALKPHOS 218*  ALT 826*  AST 1,249*  GLUCOSE 186*   No  results found for: CKTOTAL, CKMB, CKMBINDEX, TROPONINI No results found for: CHOL No results found for: HDL No results found for: LDLCALC No results found for: TRIG No results found for: CHOLHDL No results found for: LDLDIRECT    Radiology: Ct Abdomen Pelvis W Contrast  Result Date: 08/17/2016 CLINICAL DATA:  71 year old male with lower abdominal pain. EXAM: CT ABDOMEN AND PELVIS WITH CONTRAST TECHNIQUE: Multidetector CT imaging of the abdomen and pelvis was performed using the standard protocol following bolus administration of intravenous contrast. CONTRAST:  <See Chart> ISOVUE-300 IOPAMIDOL (ISOVUE-300) INJECTION 61%, 137mL ISOVUE-300 IOPAMIDOL (ISOVUE-300) INJECTION 61% COMPARISON:  Abdominal CT dated 12/16/2015 FINDINGS: Evaluation is very limited due to paucity  of intra-abdominal fat. Lower chest: The visualized lung bases are clear. No intra-abdominal free fluid. A pocket of air in the anterior mid abdomen (series 2 image 42) is most likely intraluminal and represents air in the cecum. Although evaluation is very limited due to paucity of intra-abdominal fat. Pneumoperitoneum is much less likely. Correlation with clinical exam recommended. Hepatobiliary: There is moderate dilatation of the intrahepatic biliary tree, new compared to the prior CT. There is dilatation of the common bile duct measuring up to 18 mm with dilatation of the central CBD at the head of the pancreas measuring up to 11 mm. This is new compared to the prior CT. No calcified stone identified within the CBD. An ampullary lesion is not entirely excluded. Further evaluation with MRCP is recommended. The gallbladder is moderately distended. No calcified stone is seen within the gallbladder on the CT. Small stones noted on today's ultrasound. Clinical correlation is recommended. A hepatobiliary scintigraphy may provide better evaluation of the gallbladder if there is a high clinical concern for acute cholecystitis . The liver is  unremarkable. Pancreas: Unremarkable. No pancreatic ductal dilatation or surrounding inflammatory changes. Spleen: Normal in size without focal abnormality. Adrenals/Urinary Tract: The adrenal glands are unremarkable. Subcentimeter bilateral renal hypodense lesions are too small to characterize but most likely represent cysts. There is no hydronephrosis on either side. The visualized ureters appear unremarkable. The urinary bladder is distended. Stomach/Bowel: Evaluation of the bowel is limited due to paucity of the intra-abdominal fat. The stomach is distended. Oral contrast is noted within the stomach and loops of small bowel. The colon is collapsed. Diffusely thickened appearance of the small bowel and colon may be related to underdistention. Enteritis or enterocolitis is not excluded. Clinical correlation is recommended. There is no evidence of bowel obstruction. The appendix is normal. Vascular/Lymphatic: There is advanced aortoiliac atherosclerotic disease. The aorta is ectatic. The origins of the celiac axis, SMA, and IMA appear patent. The SMV, splenic vein, and main portal vein are patent. No portal venous gas identified. There is no adenopathy. Reproductive: The prostate and seminal vesicles are grossly unremarkable. Other: There is loss of subcutaneous fat and cachexia. Musculoskeletal: Degenerative changes of the spine. No acute fracture. IMPRESSION: 1. Distended gallbladder. No calcified stone seen on this CT. Stones however seen on today's ultrasound. A hepatobiliary scintigraphy may provide better evaluation of the gallbladder if there is a high clinical concern for acute cholecystitis . 2. Dilatation of the intrahepatic biliary tree and common bile duct, new compared to the prior study of indeterminate etiology. An ampullary lesion is not excluded. Further evaluation with MRCP recommended. 3. Distended stomach without evidence of mechanical gastric outlet obstruction. Findings may represent  functional decreased peristalsis/gastroparesis. 4. No evidence of small-bowel obstruction. Thickened appearance of the small bowel and colon likely related to underdistention. Enterocolitis is not entirely excluded. 5. Pocket of air in the anterior mid abdomen likely intraluminal and air within the cecum. 6.  Aortic Atherosclerosis (ICD10-I70.0). Electronically Signed   By: Anner Crete M.D.   On: 08/17/2016 05:30   US Abdomen Limited Ruq  Result Date: 08/17/2016 CLINICAL DATA:  Abdominal pain and gallstones EXAM: ULTRASOUND ABDOMEN LIMITED RIGHT UPPER QUADRANT COMPARISON:  None. FINDINGS: Gallbladder: The gallbladder is distended with multiple stones that measure up to 8 mm. There is minimal wall thickening. A positive sonographic Percell Miller sign was demonstrated by the sonographer. No pericholecystic fluid. Common bile duct: Diameter: 14.3 mm, dilated. Liver: Moderate intrahepatic biliary dilatation. Liver parenchyma is normal. IMPRESSION: 1.  Positive sonographic Murphy sign and cholelithiasis, suggesting acute cholecystitis. 2. Dilated common bile duct and moderate intrahepatic biliary dilatation, but no choledocholithiasis is visible on this study. Electronically Signed   By: Ulyses Jarred M.D.   On: 08/17/2016 05:07    EKG: Sinus tachycardia, rate 106 bpm, IVCD  ASSESSMENT AND PLAN:  1. Coronary artery disease status post CABG 2, 08/2015, in the absence of exertional chest pain, with occasional episodes of substernal soreness, brief in nature 2. Dyspnea on exertion with underlying COPD and ongoing tobacco abuse 3. Acute cholecystitis   Recommendations: 1. Continue current therapy. 2. Encourage patient to stop smoking. 3. Recommend continuing metoprolol pre-, peri-, and post-operatively. 4. Aspirin has been held for procedure. 5. No further cardiac diagnostics recommended at this time. 6. Patient is a low, but acceptable risk for serious cardiovascular complications when undergoing ERCP. The  patient may proceed.  Signed:  Clabe Seal PA-C 08/17/2016, 9:30 AM

## 2016-08-17 NOTE — Consult Note (Addendum)
Reason for Consult:  Acute cholecystitis Referring Physician: Harle Stanford MD  Todd Whitehead is an 71 y.o. male.  HPI:  Pt is a 71 yo M transferred from [place] after he presented with 2-3 days of severe epigastric pain accompanied by N/V.  He had worsening over the last 24 hours.  He had subjective fevers/chills at home.  He has not had change in the color of his urine or his stools.  He has not noted jaundice.  He has no family history of gallbladder disease of which he is aware.  He has a personal history of melanoma but no known family history of cancer.  He denies weight loss, and he has actually gained 8 pounds since his CABG last year.  He denies diarrhea or constipation.    He has had a few episodes of epigastric pain over the last year, but they were brief and less severe.  He had a CT "and a bunch of other tests" but they were negative.    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  He has been asymptomatic since.  He continues to smoke.    Past Medical History:  Diagnosis Date  . Anxiety   . Arthritis   . COPD (chronic obstructive pulmonary disease) (HCC)    smoker  . Dysrhythmia   . Hypertension    on  no meds  . Melanoma (HCC)    Resected from Left upper arm.     Past Surgical History:  Procedure Laterality Date  . BACK SURGERY  01/19/98  . CARDIAC CATHETERIZATION N/A 08/19/2015   Procedure: Left Heart Cath and Coronary Angiography;  Surgeon: Dalia Heading, MD;  Location: [facility] INVASIVE CV LAB;  Service: Cardiovascular;  Laterality: N/A;  . COLON SURGERY  age 43 days old   " bowel was twisted up"  . COLONOSCOPY WITH PROPOFOL N/A 03/19/2016   Procedure: COLONOSCOPY WITH PROPOFOL;  Surgeon: Christena Deem, MD;  Location[facility] ENDOSCOPY;  Service: Endoscopy;  Laterality: N/A;  . CORONARY ARTERY BYPASS GRAFT    . EYE SURGERY Left 03/26/11  . LUMBAR LAMINECTOMY/DECOMPRESSION MICRODISCECTOMY Right 10/02/2012    Procedure: Right Lumbar Five-Sacral One Lumbar laminotomy/Microdiskectomy;  Surgeon: Hewitt Shorts, MD;  Location: [facility] NEURO ORS;  Service: Neurosurgery;  Laterality: Right;  Right Lumbar Five-Sacral One Lumbar laminotomy/Microdiskectomy   FH:  HTN  Social History:  reports that he has been smoking Cigarettes.  He has a 112.00 pack-year smoking history. He has never used smokeless tobacco. He reports that he does not drink alcohol or use drugs.  Allergies:  Allergies  Allergen Reactions  . Other     Cheese,butter,sour cream,cottage cheese    Medications:  Prior to Admission:  Prescriptions Prior to Admission  Medication Sig Dispense Refill Last Dose  . metoprolol succinate (TOPROL XL) 25 MG 24 hr tablet Take 12.5 mg by mouth daily.   03/19/2016 at 0600  . tamsulosin (FLOMAX) 0.4 MG CAPS capsule Take 1 capsule (0.4 mg total) by mouth daily after breakfast. 30 capsule 1 08/19/2015 at 0500  . [DISCONTINUED] ALPRAZolam (XANAX) 1 MG tablet Take 1 mg by mouth 3 (three) times daily as needed for sleep.   03/18/2016 at Unknown time  . [DISCONTINUED] aspirin EC 81 MG tablet Take 81 mg by mouth daily.   03/18/2016 at Unknown time  . [DISCONTINUED] atorvastatin (LIPITOR) 40 MG tablet Take 40 mg by  mouth daily.     . [DISCONTINUED] brimonidine (ALPHAGAN) 0.15 % ophthalmic solution Place 1 drop into both eyes 2 (two) times daily.   03/19/2016 at Unknown time  . [DISCONTINUED] busPIRone (BUSPAR) 30 MG tablet Take 30 mg by mouth 2 (two) times daily.   03/18/2016 at Unknown time  . [DISCONTINUED] citalopram (CELEXA) 10 MG tablet Take 10 mg by mouth daily.     . [DISCONTINUED] ferrous sulfate 325 (65 FE) MG tablet Take 325 mg by mouth daily with breakfast.     . [DISCONTINUED] furosemide (LASIX) 40 MG tablet Take 40 mg by mouth daily.     . [DISCONTINUED] HYDROcodone-acetaminophen (NORCO/VICODIN) 5-325 MG per tablet Take 1 tablet by mouth every 4 (four) hours as needed for pain.   Not Taking at Unknown time   . [DISCONTINUED] latanoprost (XALATAN) 0.005 % ophthalmic solution Place 1 drop into both eyes at bedtime.   03/18/2016 at Unknown time  . [DISCONTINUED] naproxen sodium (ANAPROX) 220 MG tablet Take 440 mg by mouth as needed (for pain.).   03/18/2016 at Unknown time  . [DISCONTINUED] nitroGLYCERIN (NITROSTAT) 0.6 MG SL tablet Place 0.6 mg under the tongue every 5 (five) minutes as needed for chest pain.   08/19/2015 at 0600  . [DISCONTINUED] oxyCODONE-acetaminophen (PERCOCET/ROXICET) 5-325 MG per tablet Take 1-2 tablets by mouth every 4 (four) hours as needed. (Patient not taking: Reported on 08/19/2015) 60 tablet 0 Not Taking at Unknown time  . [DISCONTINUED] potassium chloride SA (K-DUR,KLOR-CON) 20 MEQ tablet Take 20 mEq by mouth daily.     . [DISCONTINUED] traMADol (ULTRAM) 50 MG tablet Take 50 mg by mouth 2 (two) times daily.   08/19/2015 at 0500    Results for orders placed or performed during the hospital encounter of 08/17/16 (from the past 48 hour(s))  Lipase, blood     Status: None   Collection Time: 08/17/16  2:45 AM  Result Value Ref Range   Lipase 31 11 - 51 U/L  Comprehensive metabolic panel     Status: Abnormal   Collection Time: 08/17/16  2:45 AM  Result Value Ref Range   Sodium 135 135 - 145 mmol/L   Potassium 3.2 (L) 3.5 - 5.1 mmol/L   Chloride 98 (L) 101 - 111 mmol/L   CO2 25 22 - 32 mmol/L   Glucose, Bld 186 (H) 65 - 99 mg/dL   BUN 14 6 - 20 mg/dL   Creatinine, Ser 1.03 0.61 - 1.24 mg/dL   Calcium 9.5 8.9 - 10.3 mg/dL   Total Protein 7.4 6.5 - 8.1 g/dL   Albumin 4.3 3.5 - 5.0 g/dL   AST 1,249 (H) 15 - 41 U/L   ALT 826 (H) 17 - 63 U/L   Alkaline Phosphatase 218 (H) 38 - 126 U/L   Total Bilirubin 2.8 (H) 0.3 - 1.2 mg/dL   GFR calc non Af Amer >60 >60 mL/min   GFR calc Af Amer >60 >60 mL/min    Comment: (NOTE) The eGFR has been calculated using the CKD EPI equation. This calculation has not been validated in all clinical situations. eGFR's persistently <60 mL/min  signify possible Chronic Kidney Disease.    Anion gap 12 5 - 15  CBC     Status: Abnormal   Collection Time: 08/17/16  2:45 AM  Result Value Ref Range   WBC 10.8 (H) 3.8 - 10.6 K/uL   RBC 4.24 (L) 4.40 - 5.90 MIL/uL   Hemoglobin 13.0 13.0 - 18.0 g/dL   HCT 37.5 (  L) 40.0 - 52.0 %   MCV 88.5 80.0 - 100.0 fL   MCH 30.6 26.0 - 34.0 pg   MCHC 34.6 32.0 - 36.0 g/dL   RDW 14.3 11.5 - 14.5 %   Platelets 191 150 - 440 K/uL  Urinalysis, Complete w Microscopic     Status: Abnormal   Collection Time: 08/17/16  5:33 AM  Result Value Ref Range   Color, Urine YELLOW (A) YELLOW   APPearance CLEAR (A) CLEAR   Specific Gravity, Urine 1.009 1.005 - 1.030   pH 6.0 5.0 - 8.0   Glucose, UA NEGATIVE NEGATIVE mg/dL   Hgb urine dipstick MODERATE (A) NEGATIVE   Bilirubin Urine NEGATIVE NEGATIVE   Ketones, ur NEGATIVE NEGATIVE mg/dL   Protein, ur 30 (A) NEGATIVE mg/dL   Nitrite NEGATIVE NEGATIVE   Leukocytes, UA NEGATIVE NEGATIVE   RBC / HPF 6-30 0 - 5 RBC/hpf   WBC, UA 0-5 0 - 5 WBC/hpf   Bacteria, UA NONE SEEN NONE SEEN   Squamous Epithelial / LPF NONE SEEN NONE SEEN   Sperm, UA PRESENT     Ct Abdomen Pelvis W Contrast  Result Date: 08/17/2016 CLINICAL DATA:  71 year old male with lower abdominal pain. EXAM: CT ABDOMEN AND PELVIS WITH CONTRAST TECHNIQUE: Multidetector CT imaging of the abdomen and pelvis was performed using the standard protocol following bolus administration of intravenous contrast. CONTRAST:  <See Chart> ISOVUE-300 IOPAMIDOL (ISOVUE-300) INJECTION 61%, 164m ISOVUE-300 IOPAMIDOL (ISOVUE-300) INJECTION 61% COMPARISON:  Abdominal CT dated 12/16/2015 FINDINGS: Evaluation is very limited due to paucity of intra-abdominal fat. Lower chest: The visualized lung bases are clear. No intra-abdominal free fluid. A pocket of air in the anterior mid abdomen (series 2 image 42) is most likely intraluminal and represents air in the cecum. Although evaluation is very limited due to paucity of  intra-abdominal fat. Pneumoperitoneum is much less likely. Correlation with clinical exam recommended. Hepatobiliary: There is moderate dilatation of the intrahepatic biliary tree, new compared to the prior CT. There is dilatation of the common bile duct measuring up to 18 mm with dilatation of the central CBD at the head of the pancreas measuring up to 11 mm. This is new compared to the prior CT. No calcified stone identified within the CBD. An ampullary lesion is not entirely excluded. Further evaluation with MRCP is recommended. The gallbladder is moderately distended. No calcified stone is seen within the gallbladder on the CT. Small stones noted on today's ultrasound. Clinical correlation is recommended. A hepatobiliary scintigraphy may provide better evaluation of the gallbladder if there is a high clinical concern for acute cholecystitis . The liver is unremarkable. Pancreas: Unremarkable. No pancreatic ductal dilatation or surrounding inflammatory changes. Spleen: Normal in size without focal abnormality. Adrenals/Urinary Tract: The adrenal glands are unremarkable. Subcentimeter bilateral renal hypodense lesions are too small to characterize but most likely represent cysts. There is no hydronephrosis on either side. The visualized ureters appear unremarkable. The urinary bladder is distended. Stomach/Bowel: Evaluation of the bowel is limited due to paucity of the intra-abdominal fat. The stomach is distended. Oral contrast is noted within the stomach and loops of small bowel. The colon is collapsed. Diffusely thickened appearance of the small bowel and colon may be related to underdistention. Enteritis or enterocolitis is not excluded. Clinical correlation is recommended. There is no evidence of bowel obstruction. The appendix is normal. Vascular/Lymphatic: There is advanced aortoiliac atherosclerotic disease. The aorta is ectatic. The origins of the celiac axis, SMA, and IMA appear patent. The SMV,  splenic  vein, and main portal vein are patent. No portal venous gas identified. There is no adenopathy. Reproductive: The prostate and seminal vesicles are grossly unremarkable. Other: There is loss of subcutaneous fat and cachexia. Musculoskeletal: Degenerative changes of the spine. No acute fracture. IMPRESSION: 1. Distended gallbladder. No calcified stone seen on this CT. Stones however seen on today's ultrasound. A hepatobiliary scintigraphy may provide better evaluation of the gallbladder if there is a high clinical concern for acute cholecystitis . 2. Dilatation of the intrahepatic biliary tree and common bile duct, new compared to the prior study of indeterminate etiology. An ampullary lesion is not excluded. Further evaluation with MRCP recommended. 3. Distended stomach without evidence of mechanical gastric outlet obstruction. Findings may represent functional decreased peristalsis/gastroparesis. 4. No evidence of small-bowel obstruction. Thickened appearance of the small bowel and colon likely related to underdistention. Enterocolitis is not entirely excluded. 5. Pocket of air in the anterior mid abdomen likely intraluminal and air within the cecum. 6.  Aortic Atherosclerosis (ICD10-I70.0). Electronically Signed   By: Elgie Collard M.D.   On: 08/17/2016 05:30   US Abdomen Limited Ruq  Result Date: 08/17/2016 CLINICAL DATA:  Abdominal pain and gallstones EXAM: ULTRASOUND ABDOMEN LIMITED RIGHT UPPER QUADRANT COMPARISON:  None. FINDINGS: Gallbladder: The gallbladder is distended with multiple stones that measure up to 8 mm. There is minimal wall thickening. A positive sonographic Eulah Pont sign was demonstrated by the sonographer. No pericholecystic fluid. Common bile duct: Diameter: 14.3 mm, dilated. Liver: Moderate intrahepatic biliary dilatation. Liver parenchyma is normal. IMPRESSION: 1. Positive sonographic Murphy sign and cholelithiasis, suggesting acute cholecystitis. 2. Dilated common bile duct and  moderate intrahepatic biliary dilatation, but no choledocholithiasis is visible on this study. Electronically Signed   By: Deatra Robinson M.D.   On: 08/17/2016 05:07    Review of Systems  Constitutional: Positive for chills and fever. Negative for diaphoresis, malaise/fatigue and weight loss.  HENT: Negative.   Eyes: Negative.   Respiratory: Negative.   Cardiovascular: Negative.   Gastrointestinal: Positive for abdominal pain, nausea and vomiting. Negative for blood in stool, constipation and diarrhea.  Genitourinary: Negative.   Musculoskeletal: Negative.   Skin: Negative.   Neurological: Negative.   Endo/Heme/Allergies: Negative.   Psychiatric/Behavioral: Negative.      Blood pressure (!) 116/50, pulse 82, temperature 98.4 F (36.9 C), temperature source Oral, resp. rate 18, height 5\' 8"  (1.727 m), weight 54.7 kg (120 lb 9.5 oz), SpO2 90 %. Physical Exam  Constitutional: He is oriented to person, place, and time. He appears well-developed and well-nourished. No distress.  HENT:  Head: Normocephalic and atraumatic.  Right Ear: External ear normal.  Left Ear: External ear normal.  Eyes: Pupils are equal, round, and reactive to light. Conjunctivae are normal. Right eye exhibits no discharge. Left eye exhibits no discharge. No scleral icterus.  Neck: Neck supple. No tracheal deviation present. No thyromegaly present.  Cardiovascular: Normal rate, regular rhythm and intact distal pulses.   Respiratory: Effort normal. No respiratory distress. He exhibits no tenderness.  On oxygen  GI: Soft. He exhibits no distension. There is tenderness (RUQ.  Fullness in this area.). There is no rebound and no guarding.  Musculoskeletal: Normal range of motion. He exhibits no edema, tenderness or deformity.  Neurological: He is alert and oriented to person, place, and time. Coordination normal.  Skin: Skin is warm and dry. No rash noted. He is not diaphoretic. No erythema. No pallor.  Psychiatric: He  has a normal mood and affect. His  behavior is normal. Judgment and thought content normal.      Assessment/Plan: Acute calculous cholecystitis with elevated liver function tests and dilated common bile duct. Presumed choledocholithiasis, although it is possibly due to stricture.  No periampullary mass visible on CT.   Dr. Burnis Medin consulting GI for ERCP.  They may want MRCP prior to ERCP.  Prefer ERCP first or combined ERCP/lap chole to decrease risk of high pressure in the biliary system at the time of surgery if biliary obstruction remains.   Will need cholecystectomy.  Hopefully he will not have adhesions from childhood correction of malrotation to prevent laparoscopic approach.   Agree with antibiotics in patient in setting of biliary obstruction and cholecystitis. Will follow.    Given history of CAD and CABG, cardiology was asked to see patient for risk stratification.  They have done this and have deemed him low risk since he is revascularized and having no symptoms.   He has been placed on a nicotine patch.    Fallon Haecker 08/17/2016, 6:07 PM

## 2016-08-18 ENCOUNTER — Inpatient Hospital Stay (HOSPITAL_COMMUNITY): Payer: Medicare Other

## 2016-08-18 ENCOUNTER — Inpatient Hospital Stay (HOSPITAL_COMMUNITY): Payer: Medicare Other | Admitting: Certified Registered"

## 2016-08-18 ENCOUNTER — Encounter (HOSPITAL_COMMUNITY)
Admission: AD | Disposition: A | Discharge status: Home / Self Care | Payer: Self-pay | Source: Other Acute Inpatient Hospital | Attending: Internal Medicine

## 2016-08-18 ENCOUNTER — Encounter (HOSPITAL_COMMUNITY): Payer: Self-pay | Admitting: *Deleted

## 2016-08-18 DIAGNOSIS — K8050 Calculus of bile duct without cholangitis or cholecystitis without obstruction: Secondary | ICD-10-CM

## 2016-08-18 DIAGNOSIS — R945 Abnormal results of liver function studies: Secondary | ICD-10-CM

## 2016-08-18 DIAGNOSIS — K838 Other specified diseases of biliary tract: Secondary | ICD-10-CM

## 2016-08-18 HISTORY — PX: ERCP: SHX5425

## 2016-08-18 LAB — CBC WITH DIFFERENTIAL/PLATELET
BASOS ABS: 0 10*3/uL (ref 0.0–0.1)
Basophils Relative: 0 %
Eosinophils Absolute: 0 10*3/uL (ref 0.0–0.7)
Eosinophils Relative: 0 %
HCT: 31.1 % — ABNORMAL LOW (ref 39.0–52.0)
Hemoglobin: 10.5 g/dL — ABNORMAL LOW (ref 13.0–17.0)
LYMPHS ABS: 0.8 10*3/uL (ref 0.7–4.0)
Lymphocytes Relative: 7 %
MCH: 29.4 pg (ref 26.0–34.0)
MCHC: 33.8 g/dL (ref 30.0–36.0)
MCV: 87.1 fL (ref 78.0–100.0)
MONO ABS: 0.5 10*3/uL (ref 0.1–1.0)
MONOS PCT: 4 %
Neutro Abs: 10.2 10*3/uL — ABNORMAL HIGH (ref 1.7–7.7)
Neutrophils Relative %: 89 %
PLATELETS: 113 10*3/uL — AB (ref 150–400)
RBC: 3.57 MIL/uL — AB (ref 4.22–5.81)
RDW: 14.2 % (ref 11.5–15.5)
WBC: 11.5 10*3/uL — ABNORMAL HIGH (ref 4.0–10.5)

## 2016-08-18 LAB — COMPREHENSIVE METABOLIC PANEL
ALBUMIN: 2.8 g/dL — AB (ref 3.5–5.0)
ALT: 410 U/L — ABNORMAL HIGH (ref 17–63)
AST: 284 U/L — AB (ref 15–41)
Alkaline Phosphatase: 110 U/L (ref 38–126)
Anion gap: 6 (ref 5–15)
BUN: 13 mg/dL (ref 6–20)
CHLORIDE: 102 mmol/L (ref 101–111)
CO2: 25 mmol/L (ref 22–32)
Calcium: 7.9 mg/dL — ABNORMAL LOW (ref 8.9–10.3)
Creatinine, Ser: 1.07 mg/dL (ref 0.61–1.24)
GFR calc Af Amer: >60 mL/min (ref >60)
GFR calc non Af Amer: >60 mL/min (ref >60)
GLUCOSE: 82 mg/dL (ref 65–99)
POTASSIUM: 3.6 mmol/L (ref 3.5–5.1)
Sodium: 133 mmol/L — ABNORMAL LOW (ref 135–145)
Total Bilirubin: 2.1 mg/dL — ABNORMAL HIGH (ref 0.3–1.2)
Total Protein: 5.2 g/dL — ABNORMAL LOW (ref 6.5–8.1)

## 2016-08-18 SURGERY — ERCP, WITH INTERVENTION IF INDICATED
Anesthesia: General

## 2016-08-18 MED ORDER — IOPAMIDOL (ISOVUE-300) INJECTION 61%
INTRAVENOUS | Status: AC
  Filled 2016-08-18: qty 50

## 2016-08-18 MED ORDER — DEXTROSE 5 % IV SOLN
INTRAVENOUS | Status: DC | PRN
  Administered 2016-08-18: 50 ug/min via INTRAVENOUS

## 2016-08-18 MED ORDER — LACTATED RINGERS IV SOLN
INTRAVENOUS | Status: DC
  Administered 2016-08-18 (×2): via INTRAVENOUS

## 2016-08-18 MED ORDER — KETOROLAC TROMETHAMINE 30 MG/ML IJ SOLN: 15.0000 mg | Freq: Once | INTRAMUSCULAR | Status: DC | PRN

## 2016-08-18 MED ORDER — DIPHENHYDRAMINE HCL 25 MG PO CAPS
25.0000 mg | ORAL_CAPSULE | Freq: Every evening | ORAL | Status: DC | PRN
  Administered 2016-08-18: 50 mg via ORAL
  Administered 2016-08-19: 25 mg via ORAL
  Filled 2016-08-18: qty 2
  Filled 2016-08-18: qty 1

## 2016-08-18 MED ORDER — ALPRAZOLAM 0.5 MG PO TABS
1.0000 mg | ORAL_TABLET | Freq: Three times a day (TID) | ORAL | Status: DC | PRN
Start: 2016-08-18 — End: 2016-08-20
  Administered 2016-08-18 – 2016-08-20 (×2): 1 mg via ORAL
  Filled 2016-08-18 (×2): qty 2

## 2016-08-18 MED ORDER — LIDOCAINE HCL (CARDIAC) 20 MG/ML IV SOLN
INTRAVENOUS | Status: DC | PRN
  Administered 2016-08-18: 60 mg via INTRAVENOUS

## 2016-08-18 MED ORDER — PROPOFOL 10 MG/ML IV BOLUS
INTRAVENOUS | Status: DC | PRN
  Administered 2016-08-18: 140 mg via INTRAVENOUS

## 2016-08-18 MED ORDER — ONDANSETRON HCL 4 MG/2ML IJ SOLN
INTRAMUSCULAR | Status: DC | PRN
  Administered 2016-08-18: 4 mg via INTRAVENOUS

## 2016-08-18 MED ORDER — SUCCINYLCHOLINE CHLORIDE 20 MG/ML IJ SOLN
INTRAMUSCULAR | Status: DC | PRN
  Administered 2016-08-18: 100 mg via INTRAVENOUS

## 2016-08-18 MED ORDER — FENTANYL CITRATE (PF) 100 MCG/2ML IJ SOLN
INTRAMUSCULAR | Status: DC | PRN
  Administered 2016-08-18 (×2): 50 ug via INTRAVENOUS

## 2016-08-18 MED ORDER — PHENYLEPHRINE HCL 10 MG/ML IJ SOLN
INTRAMUSCULAR | Status: DC | PRN
  Administered 2016-08-18 (×4): 200 ug via INTRAVENOUS

## 2016-08-18 MED ORDER — DEXAMETHASONE SODIUM PHOSPHATE 10 MG/ML IJ SOLN
INTRAMUSCULAR | Status: DC | PRN
  Administered 2016-08-18: 10 mg via INTRAVENOUS

## 2016-08-18 MED ORDER — SODIUM CHLORIDE 0.9 % IV SOLN
INTRAVENOUS | Status: DC
  Administered 2016-08-18: via INTRAVENOUS

## 2016-08-18 MED ORDER — PROMETHAZINE HCL 25 MG/ML IJ SOLN: 6.2500 mg | INTRAMUSCULAR | Status: DC | PRN

## 2016-08-18 MED ORDER — DIPHENHYDRAMINE HCL 25 MG PO CAPS
25.0000 mg | ORAL_CAPSULE | Freq: Once | ORAL | Status: AC
  Administered 2016-08-18: 25 mg via ORAL
  Filled 2016-08-18: qty 1

## 2016-08-18 MED ORDER — FENTANYL CITRATE (PF) 100 MCG/2ML IJ SOLN: 25.0000 ug | INTRAMUSCULAR | Status: DC | PRN

## 2016-08-18 MED ORDER — GLUCAGON HCL RDNA (DIAGNOSTIC) 1 MG IJ SOLR
INTRAMUSCULAR | Status: AC
  Filled 2016-08-18: qty 1

## 2016-08-18 MED ORDER — INDOMETHACIN 50 MG RE SUPP
RECTAL | Status: AC
  Filled 2016-08-18: qty 2

## 2016-08-18 MED ORDER — DEXTROSE-NACL 5-0.9 % IV SOLN
INTRAVENOUS | Status: DC
  Administered 2016-08-18 – 2016-08-19 (×3): via INTRAVENOUS

## 2016-08-18 MED ORDER — ALBUTEROL SULFATE HFA 108 (90 BASE) MCG/ACT IN AERS
INHALATION_SPRAY | RESPIRATORY_TRACT | Status: DC | PRN
  Administered 2016-08-18 (×6): 2 via RESPIRATORY_TRACT

## 2016-08-18 NOTE — Anesthesia Procedure Notes (Signed)
Procedure Name: Intubation Date/Time: 08/18/2016 9:40 AM Performed by: Lance Coon Pre-anesthesia Checklist: Patient identified, Emergency Drugs available, Suction available, Patient being monitored and Timeout performed Patient Re-evaluated:Patient Re-evaluated prior to induction Oxygen Delivery Method: Circle system utilized Preoxygenation: Pre-oxygenation with 100% oxygen Induction Type: IV induction Ventilation: Mask ventilation without difficulty Laryngoscope Size: Miller and 3 Grade View: Grade I Tube type: Oral Tube size: 7.5 mm Number of attempts: 1 Airway Equipment and Method: Stylet Placement Confirmation: ETT inserted through vocal cords under direct vision,  positive ETCO2 and breath sounds checked- equal and bilateral Secured at: 22 cm Tube secured with: Tape Dental Injury: Teeth and Oropharynx as per pre-operative assessment

## 2016-08-18 NOTE — Op Note (Signed)
[facility] Patient Name: Todd Whitehead Procedure Date : 08/18/2016 MRN: [id number] Attending MD: [name] , MD Date of Birth: 20-May-1945 CSN: [id number] Age: 71 Admit Type: Inpatient Procedure:                ERCP Indications:              Biliary dilation on Computed Tomogram Scan,                            Abnormal abdominal ultrasound, Elevated liver                            enzymes Providers:                [name], MD, Vista Lawman, RN, Elspeth Cho                            Tech., Technician, Lance Coon, CRNA Referring MD:              Medicines:                Monitored Anesthesia Care Complications:            No immediate complications. Estimated Blood Loss:     Estimated blood loss: none. Procedure:                Pre-Anesthesia Assessment:                           - Prior to the procedure, a History and Physical                            was performed, and patient medications and                            allergies were reviewed. The patient's tolerance of                            previous anesthesia was also reviewed. The risks                            and benefits of the procedure and the sedation                            options and risks were discussed with the patient.                            All questions were answered, and informed consent                            was obtained. Prior Anticoagulants: The patient has                            taken no previous anticoagulant or antiplatelet                            agents. ASA Grade Assessment:  III - A patient with                            severe systemic disease. After reviewing the risks                            and benefits, the patient was deemed in                            satisfactory condition to undergo the procedure.                           After obtaining informed consent, the scope was                            passed under direct vision. Throughout the                           procedure, the patient's blood pressure, pulse, and                            oxygen saturations were monitored continuously. The                            [id number] ([id number]) scope was introduced through                            the mouth, The procedure was aborted. The scope was                            not inserted. bile ducts Medications were duodenum.                            The ERCP was technically difficult and complex due                            to challenging cannulation because of inability to                            visualize the major papilla. The patient tolerated                            the procedure well. Scope In: Scope Out: Findings:      The scout film was normal. The esophagus was successfully intubated       under direct vision without detailed examination of the pharynx, larynx,       and associated structures, and upper GI tract. The upper GI tract was       grossly normal. The major papilla was not found. There was bile noted in       the duodenum. The major papilla could not be identified despite several       attempts. Impression:               - The major papilla was not found. Recommendation:           -  Perform MRCP today.                           - Surgical evaluation for cholecystectomy,                            Intra-operative cholagiogram.                           If obstruction noted on IOC, may benefit from a                            Rendezvous approach to canulate the CBD(if needed). Procedure Code(s):        --- Professional ---                           [id number], 52, Endoscopic retrograde                            cholangiopancreatography (ERCP); diagnostic,                            including collection of specimen(s) by brushing or                            washing, when performed (separate procedure) Diagnosis Code(s):        --- Professional ---                           R74.8, Abnormal levels of  other serum enzymes                           K83.8, Other specified diseases of biliary tract                           R93.5, Abnormal findings on diagnostic imaging of                            other abdominal regions, including retroperitoneum CPT copyright 2016 [employer]. All rights reserved. The codes documented in this report are preliminary and upon coder review may  be revised to meet current compliance requirements. [name], MD 08/18/2016 10:26:21 AM This report has been signed electronically. Number of Addenda: 0

## 2016-08-18 NOTE — H&P (View-Only) (Signed)
[facility] Consult  Referring Provider: Waldron Session, MD Primary Care Physician:  [name], MD Primary Gastroenterologist: [name], MD  Reason for Consultation:  Dilated CBD, possible ampullary mass, abnormal LFTs  HPI: Todd Whitehead is a 71 y.o. male was transferred from [facility], with abnormal CBD. Patient states he was in his usual state of health, a few days prior to presentation, when he developed abdominal pain, most the upper and periumbilical, associated with nausea and multiple episodes of vomiting. Also reports fever as well as chills and shaking for the last 2 days. He was evaluated at the ER at [facility] with an ultrasound, which showed sonographic positive Murphy sign, cholelithiasis suggesting acute cholecystitis, dilated CBD, moderate intrahepatic biliary dilatation, no CBD stone on ultrasound. CT scan from 08/17/16 showed, distended gallbladder, dilated intrahepatic biliary tree and common bile duct measuring up to 18 mm, 11 mm at the pancreatic head, ampulla lesion not entirely excluded.  Patient denies yellowish discoloration of skin or sclera, change in the color of stool or urine. Patient denies unintentional weight loss, loss of appetite in recent weeks or months. He is a chronic smoker. No prior EGD. Colonoscopy from February 2018 showed multiple polyps, mostly tubular adenomas and few hyperplastic.    Past Medical History:  Diagnosis Date  . Anxiety   . Arthritis   . COPD (chronic obstructive pulmonary disease) ([place])    smoker  . Dysrhythmia   . Hypertension    on  no meds  . Melanoma ([place])    Resected from Left upper arm.     Past Surgical History:  Procedure Laterality Date  . BACK SURGERY  01/19/98  . CARDIAC CATHETERIZATION N/A 08/19/2015   Procedure: Left Heart Cath and Coronary Angiography;  Surgeon: [name], MD;  Location: [facility];  Service:  Cardiovascular;  Laterality: N/A;  . COLON SURGERY  age 40 days old   " bowel was twisted up"  . COLONOSCOPY WITH PROPOFOL N/A 03/19/2016   Procedure: COLONOSCOPY WITH PROPOFOL;  Surgeon: [name], MD;  Location: [facility] ENDOSCOPY;  Service: Endoscopy;  Laterality: N/A;  . CORONARY ARTERY BYPASS GRAFT    . EYE SURGERY Left 03/26/11  . LUMBAR LAMINECTOMY/DECOMPRESSION MICRODISCECTOMY Right 10/02/2012   Procedure: Right Lumbar Five-Sacral One Lumbar laminotomy/Microdiskectomy;  Surgeon: [name], MD;  Location: [facility];  Service: Neurosurgery;  Laterality: Right;  Right Lumbar Five-Sacral One Lumbar laminotomy/Microdiskectomy    Prior to Admission medications   Medication Sig Start Date End Date Taking? Authorizing Provider  ALPRAZolam Duanne Moron) 1 MG tablet Take 1 mg by mouth 3 (three) times daily as needed for anxiety.   Yes [provider]  metoprolol succinate (TOPROL XL) 25 MG 24 hr tablet Take 12.5 mg by mouth daily.   Yes [provider]  tamsulosin (FLOMAX) 0.4 MG CAPS capsule Take 1 capsule (0.4 mg total) by mouth daily after breakfast. 10/03/12  Yes Jovita Gamma, MD    Current Facility-Administered Medications  Medication Dose Route Frequency Provider Last Rate Last Dose  . 0.9 %  sodium chloride infusion   Intravenous Continuous [facility] Session, MD   Stopped at 08/18/16 0019  . [MAR Hold] enoxaparin (LOVENOX) injection 40 mg  40 mg Subcutaneous Q24H [facility] Session, MD   40 mg at 08/17/16 2011  . lactated ringers infusion   Intravenous Continuous [name], MD 20 mL/hr at 08/18/16 0919    . [MAR Hold] metoprolol succinate (  TOPROL-XL) 24 hr tablet 12.5 mg  12.5 mg Oral Daily [facility] Session, MD      . Doug Sou Hold] morphine 4 MG/ML injection 2 mg  2 mg Intravenous Q3H PRN [facility] Session, MD   2 mg at 08/18/16 0843  . [MAR Hold] nicotine (NICODERM CQ - dosed in mg/24 hours) patch 21 mg  21 mg Transdermal Daily [facility] Session, MD   21 mg at 08/17/16 2012  . [MAR Hold]  ondansetron (ZOFRAN) injection 4 mg  4 mg Intravenous Q6H PRN [facility] Session, MD      . Doug Sou Hold] pantoprazole (PROTONIX) injection 40 mg  40 mg Intravenous Q24H [facility] Session, MD   40 mg at 08/17/16 2011  . [MAR Hold] piperacillin-tazobactam (ZOSYN) IVPB 3.375 g  3.375 g Intravenous Q8H Burnis Medin, Eiad, MD 12.5 mL/hr at 08/18/16 0610 3.375 g at 08/18/16 0610  . [MAR Hold] tamsulosin (FLOMAX) capsule 0.4 mg  0.4 mg Oral QPC breakfast [facility] Session, MD        Allergies as of 08/17/2016 - Review Complete 08/17/2016  Allergen Reaction Noted  . Other  10/02/2012    No family history on file.  Social History   Social History  . Marital status: Divorced    Spouse name: N/A  . Number of children: N/A  . Years of education: N/A   Occupational History  . Not on file.   Social History Main Topics  . Smoking status: Current Every Day Smoker    Packs/day: 2.00    Years: 56.00    Types: Cigarettes  . Smokeless tobacco: Never Used  . Alcohol use No  . Drug use: No  . Sexual activity: Not Currently   Other Topics Concern  . Not on file   Social History Narrative  . No narrative on file    Review of Systems: Positive for: GI: Described in detail in HPI.    Gen: Positive for fever, chills, rigors. Denies night sweats, anorexia, fatigue, weakness, malaise, involuntary weight loss, and sleep disorder CV: Denies chest pain, angina, palpitations, syncope, orthopnea, PND, peripheral edema, and claudication. Resp: Denies dyspnea, cough, sputum, wheezing, coughing up blood. GU : Denies urinary burning, blood in urine, urinary frequency, urinary hesitancy, nocturnal urination, and urinary incontinence. MS: Denies joint pain or swelling.  Denies muscle weakness, cramps, atrophy.  Derm: Denies rash, itching, oral ulcerations, hives, unhealing ulcers.  Psych: Denies depression, anxiety, memory loss, suicidal ideation, hallucinations,  and confusion. Heme: Denies bruising, bleeding, and enlarged lymph  nodes. Neuro:  Denies any headaches, dizziness, paresthesias. Endo:  Denies any problems with DM, thyroid, adrenal function.  Physical Exam: Vital signs in last 24 hours: Temp:  [98 F (36.7 C)-99.8 F (37.7 C)] 99.8 F (37.7 C) (07/21 0914) Pulse Rate:  [82-92] 92 (07/21 0914) Resp:  [16-20] 20 (07/21 0914) BP: (90-149)/(46-60) 149/60 (07/21 0914) SpO2:  [90 %-98 %] 92 % (07/21 0914) Weight:  [54.7 kg (120 lb 9.5 oz)-57.2 kg (126 lb 1.6 oz)] 54.7 kg (120 lb 9.5 oz) (07/20 1542) Last BM Date: 08/16/16  General:   Alert,  Well-developed, well-nourished, pleasant and cooperative in NAD Head:  Normocephalic and atraumatic. Eyes:  Sclera clear, mild icterus icterus.   Mild pallor. Ears:  Normal auditory acuity. Nose:  No deformity, discharge,  or lesions. Mouth:  No deformity or lesions.  Oropharynx pink & moist. Neck:  Supple; no masses or thyromegaly. Lungs:  Clear throughout to auscultation.   No wheezes, crackles, or rhonchi. No acute distress. Heart:  Regular  rate and rhythm; no murmurs, clicks, rubs,  or gallops. Extremities:  Without clubbing or edema. Neurologic:  Alert and  oriented x4;  grossly normal neurologically. Skin:  Intact without significant lesions or rashes. Psych:  Alert and cooperative. Normal mood and affect. Abdomen:  Mild generalized tenderness , most prominent in upper abdomen and right upper quadrant ,Soft,and nondistended. No masses, hepatosplenomegaly or hernias noted. Normal bowel sounds, voluntary guarding noted.       Lab Results:  Recent Labs  08/17/16 0245 08/17/16 1825 08/18/16 0344  WBC 10.8* 15.1* 11.5*  HGB 13.0 10.9* 10.5*  HCT 37.5* 31.3* 31.1*  PLT 191 122* 113*   BMET  Recent Labs  08/17/16 0245 08/17/16 1825 08/18/16 0344  NA 135  --  133*  K 3.2*  --  3.6  CL 98*  --  102  CO2 25  --  25  GLUCOSE 186*  --  82  BUN 14  --  13  CREATININE 1.03 1.07 1.07  CALCIUM 9.5  --  7.9*   LFT  Recent Labs  08/18/16 0344   PROT 5.2*  ALBUMIN 2.8*  AST 284*  ALT 410*  ALKPHOS 110  BILITOT 2.1*   PT/INR No results for input(s): LABPROT, INR in the last 72 hours.  Studies/Results: Ct Abdomen Pelvis W Contrast  Result Date: 08/17/2016 CLINICAL DATA:  71 year old male with lower abdominal pain. EXAM: CT ABDOMEN AND PELVIS WITH CONTRAST TECHNIQUE: Multidetector CT imaging of the abdomen and pelvis was performed using the standard protocol following bolus administration of intravenous contrast. CONTRAST:  <See Chart> ISOVUE-300 IOPAMIDOL (ISOVUE-300) INJECTION 61%, 145mL ISOVUE-300 IOPAMIDOL (ISOVUE-300) INJECTION 61% COMPARISON:  Abdominal CT dated 12/16/2015 FINDINGS: Evaluation is very limited due to paucity of intra-abdominal fat. Lower chest: The visualized lung bases are clear. No intra-abdominal free fluid. A pocket of air in the anterior mid abdomen (series 2 image 42) is most likely intraluminal and represents air in the cecum. Although evaluation is very limited due to paucity of intra-abdominal fat. Pneumoperitoneum is much less likely. Correlation with clinical exam recommended. Hepatobiliary: There is moderate dilatation of the intrahepatic biliary tree, new compared to the prior CT. There is dilatation of the common bile duct measuring up to 18 mm with dilatation of the central CBD at the head of the pancreas measuring up to 11 mm. This is new compared to the prior CT. No calcified stone identified within the CBD. An ampullary lesion is not entirely excluded. Further evaluation with MRCP is recommended. The gallbladder is moderately distended. No calcified stone is seen within the gallbladder on the CT. Small stones noted on today's ultrasound. Clinical correlation is recommended. A hepatobiliary scintigraphy may provide better evaluation of the gallbladder if there is a high clinical concern for acute cholecystitis . The liver is unremarkable. Pancreas: Unremarkable. No pancreatic ductal dilatation or  surrounding inflammatory changes. Spleen: Normal in size without focal abnormality. Adrenals/Urinary Tract: The adrenal glands are unremarkable. Subcentimeter bilateral renal hypodense lesions are too small to characterize but most likely represent cysts. There is no hydronephrosis on either side. The visualized ureters appear unremarkable. The urinary bladder is distended. Stomach/Bowel: Evaluation of the bowel is limited due to paucity of the intra-abdominal fat. The stomach is distended. Oral contrast is noted within the stomach and loops of small bowel. The colon is collapsed. Diffusely thickened appearance of the small bowel and colon may be related to underdistention. Enteritis or enterocolitis is not excluded. Clinical correlation is recommended. There is no evidence  of bowel obstruction. The appendix is normal. Vascular/Lymphatic: There is advanced aortoiliac atherosclerotic disease. The aorta is ectatic. The origins of the celiac axis, SMA, and IMA appear patent. The SMV, splenic vein, and main portal vein are patent. No portal venous gas identified. There is no adenopathy. Reproductive: The prostate and seminal vesicles are grossly unremarkable. Other: There is loss of subcutaneous fat and cachexia. Musculoskeletal: Degenerative changes of the spine. No acute fracture. IMPRESSION: 1. Distended gallbladder. No calcified stone seen on this CT. Stones however seen on today's ultrasound. A hepatobiliary scintigraphy may provide better evaluation of the gallbladder if there is a high clinical concern for acute cholecystitis . 2. Dilatation of the intrahepatic biliary tree and common bile duct, new compared to the prior study of indeterminate etiology. An ampullary lesion is not excluded. Further evaluation with MRCP recommended. 3. Distended stomach without evidence of mechanical gastric outlet obstruction. Findings may represent functional decreased peristalsis/gastroparesis. 4. No evidence of small-bowel  obstruction. Thickened appearance of the small bowel and colon likely related to underdistention. Enterocolitis is not entirely excluded. 5. Pocket of air in the anterior mid abdomen likely intraluminal and air within the cecum. 6.  Aortic Atherosclerosis (ICD10-I70.0). Electronically Signed   By: Anner Crete M.D.   On: 08/17/2016 05:30   US Abdomen Limited Ruq  Result Date: 08/17/2016 CLINICAL DATA:  Abdominal pain and gallstones EXAM: ULTRASOUND ABDOMEN LIMITED RIGHT UPPER QUADRANT COMPARISON:  None. FINDINGS: Gallbladder: The gallbladder is distended with multiple stones that measure up to 8 mm. There is minimal wall thickening. A positive sonographic Percell Miller sign was demonstrated by the sonographer. No pericholecystic fluid. Common bile duct: Diameter: 14.3 mm, dilated. Liver: Moderate intrahepatic biliary dilatation. Liver parenchyma is normal. IMPRESSION: 1. Positive sonographic Murphy sign and cholelithiasis, suggesting acute cholecystitis. 2. Dilated common bile duct and moderate intrahepatic biliary dilatation, but no choledocholithiasis is visible on this study. Electronically Signed   By: Ulyses Jarred M.D.   On: 08/17/2016 05:07    Impression: 1. Dilated CBD, abnormal LFTs, gallstones, possible ampullary lesion 2. Leukocytosis, currently afebrile, no hypotension or tachycardia 3. On IV Zosyn  Plan: 1. Diagnostic ERCP 2. If ampullary lesion noted, perform biopsies 3. Plan on sphincterotomy and balloon sweep, to a certain if there is any CBD obstruction. 4. Surgical evaluation being done for cholecystectomy   LOS: 1 day   [name], M.D.  08/18/2016, 9:20 AM  Pager [phone] If no answer or after 5 PM call [phone]

## 2016-08-18 NOTE — Interval H&P Note (Signed)
History and Physical Interval Note: 71 year old male, with abnormal LFTs, leucocytosis, dilated CBD, Cholecystitis, possible CBD obstruction versus ampullary mass.  08/18/2016 9:28 AM  Todd Whitehead  has presented today for ERCP, with the diagnosis of abnornal LFTs, dilated CBD, gallstones  The various methods of treatment have been discussed with the patient and family. After consideration of risks, benefits and other options for treatment, the patient has consented to  Procedure(s): ENDOSCOPIC RETROGRADE CHOLANGIOPANCREATOGRAPHY (ERCP) (N/A) as a surgical intervention .  The patient's history has been reviewed, patient examined, no change in status, stable for surgery.  I have reviewed the patient's chart and labs.  Questions were answered to the patient's satisfaction.     [name]

## 2016-08-18 NOTE — Progress Notes (Signed)
PROGRESS NOTE    Todd Whitehead  LDJ:[id number] DOB: 1945/03/29 DOA: 08/17/2016 PCP: [name], MD    Brief Narrative:   71 y.o. male with medical history significant of CABG/COPD who smokes one half pack every day presents to the hospital [facility] with a chief complaint of worsening epigastric abdominal pain nausea vomiting fevers and chills for the last few days WAS FOUND TO HAVE CBD dilation with acute cholecystitis and cholelethiasis .   Assessment & Plan:    1-Acute cholecystitis with CBD dilation: ERCP has done today and failed to show the ampulla of Vater , MRCP has ordered for better visualization with the biliary duct , we'll continue with IV fluids and antibiotics coverage . Appreciate GI and surgery evaluation for possible cholecystectomy with a stone removal from the biliary duct if MRCP confirms.  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  3-history of smoking 1-1/2 pack every day : counseling provided the patient is provided with nicotine patch   DVT prophylaxis: scd. Code Status: (Full) Family Communication: (none ) Disposition Plan: (home)   Consultants:   GI.  SURGERY.  Procedures:    , ERCP  Antimicrobials: (specify start and planned stop date. Auto populated tables are space occupying and do not give end dates)  ZOSYN , DAY#2.   Subjective:  Still with epigastric discomfort no nausea no vomiting no fevers no chills.  Objective: Vitals:   08/17/16 1542 08/17/16 2058 08/18/16 0512 08/18/16 0914  BP: (!) 116/50 (!) 100/51 (!) 109/51 (!) 149/60  Pulse: 82 87 85 92  Resp: 18 18 18 20   Temp: 98.4 F (36.9 C) 99.1 F (37.3 C) 99.6 F (37.6 C) 99.8 F (37.7 C)  TempSrc: Oral Oral Oral Oral    SpO2: 90% 97% 97% 92%  Weight: 54.7 kg (120 lb 9.5 oz)     Height: 5\' 8"  (1.727 m)       Intake/Output Summary (Last 24 hours) at 08/18/16 1052 Last data filed at 08/18/16 1042  Gross per 24 hour  Intake          1941.34 ml  Output             1300 ml  Net           641.34 ml   Filed Weights   08/17/16 1542  Weight: 54.7 kg (120 lb 9.5 oz)    Examination:  General exam: Appears calm and comfortable  Respiratory system: Clear to auscultation. Respiratory effort normal. Cardiovascular system: S1 & S2 heard, RRR. No JVD, murmurs, rubs, gallops or clicks. No pedal edema. Gastrointestinal system:Epigastric pain tenderness with rebound and no rigidity. Central nervous system: Alert and oriented. No focal neurological deficits. Extremities: Symmetric 5 x 5 power. Skin: No rashes, lesions or ulcers Psychiatry: Judgement and insight appear normal. Mood & affect appropriate.     Data Reviewed: I have personally reviewed following labs and imaging studies  CBC:  Recent Labs Lab 08/17/16 0245 08/17/16 1825 08/18/16 0344  WBC 10.8* 15.1* 11.5*  NEUTROABS  --   --  10.2*  HGB 13.0 10.9* 10.5*  HCT 37.5* 31.3* 31.1*  MCV 88.5 87.2 87.1  PLT 191 122* 030*   Basic Metabolic Panel:  Recent Labs Lab 08/17/16 0245 08/17/16 1825  08/18/16 0344  NA 135  --  133*  K 3.2*  --  3.6  CL 98*  --  102  CO2 25  --  25  GLUCOSE 186*  --  82  BUN 14  --  13  CREATININE 1.03 1.07 1.07  CALCIUM 9.5  --  7.9*   GFR: Estimated Creatinine Clearance: 49 mL/min (by C-G formula based on SCr of 1.07 mg/dL). Liver Function Tests:  Recent Labs Lab 08/17/16 0245 08/18/16 0344  AST 1,249* 284*  ALT 826* 410*  ALKPHOS 218* 110  BILITOT 2.8* 2.1*  PROT 7.4 5.2*  ALBUMIN 4.3 2.8*    Recent Labs Lab 08/17/16 0245  LIPASE 31   No results for input(s): AMMONIA in the last 168 hours. Coagulation Profile: No results for input(s): INR, PROTIME in the last 168 hours. Cardiac  Enzymes: No results for input(s): CKTOTAL, CKMB, CKMBINDEX, TROPONINI in the last 168 hours. BNP (last 3 results) No results for input(s): PROBNP in the last 8760 hours. HbA1C: No results for input(s): HGBA1C in the last 72 hours. CBG: No results for input(s): GLUCAP in the last 168 hours. Lipid Profile: No results for input(s): CHOL, HDL, LDLCALC, TRIG, CHOLHDL, LDLDIRECT in the last 72 hours. Thyroid Function Tests: No results for input(s): TSH, T4TOTAL, FREET4, T3FREE, THYROIDAB in the last 72 hours. Anemia Panel: No results for input(s): VITAMINB12, FOLATE, FERRITIN, TIBC, IRON, RETICCTPCT in the last 72 hours. Urine analysis:    Component Value Date/Time   COLORURINE YELLOW (A) 08/17/2016 0533   APPEARANCEUR CLEAR (A) 08/17/2016 0533   LABSPEC 1.009 08/17/2016 0533   PHURINE 6.0 08/17/2016 0533   GLUCOSEU NEGATIVE 08/17/2016 0533   HGBUR MODERATE (A) 08/17/2016 0533   BILIRUBINUR NEGATIVE 08/17/2016 0533   KETONESUR NEGATIVE 08/17/2016 0533   PROTEINUR 30 (A) 08/17/2016 0533   NITRITE NEGATIVE 08/17/2016 0533   LEUKOCYTESUR NEGATIVE 08/17/2016 0533   Sepsis Labs: @LABRCNTIP (procalcitonin:4,lacticidven:4)  )No results found for this or any previous visit (from the past 240 hour(s)).       Radiology Studies: Ct Abdomen Pelvis W Contrast  Result Date: 08/17/2016 CLINICAL DATA:  71 year old male with lower abdominal pain. EXAM: CT ABDOMEN AND PELVIS WITH CONTRAST TECHNIQUE: Multidetector CT imaging of the abdomen and pelvis was performed using the standard protocol following bolus administration of intravenous contrast. CONTRAST:  <See Chart> ISOVUE-300 IOPAMIDOL (ISOVUE-300) INJECTION 61%, 147mL ISOVUE-300 IOPAMIDOL (ISOVUE-300) INJECTION 61% COMPARISON:  Abdominal CT dated 12/16/2015 FINDINGS: Evaluation is very limited due to paucity of intra-abdominal fat. Lower chest: The visualized lung bases are clear. No intra-abdominal free fluid. A pocket of air in the anterior mid  abdomen (series 2 image 42) is most likely intraluminal and represents air in the cecum. Although evaluation is very limited due to paucity of intra-abdominal fat. Pneumoperitoneum is much less likely. Correlation with clinical exam recommended. Hepatobiliary: There is moderate dilatation of the intrahepatic biliary tree, new compared to the prior CT. There is dilatation of the common bile duct measuring up to 18 mm with dilatation of the central CBD at the head of the pancreas measuring up to 11 mm. This is new compared to the prior CT. No calcified stone identified within the CBD. An ampullary lesion is not entirely excluded. Further evaluation with MRCP is recommended. The gallbladder is moderately distended. No calcified stone is seen within the gallbladder on the CT. Small stones noted on today's ultrasound. Clinical correlation is recommended. A hepatobiliary scintigraphy may provide better evaluation of the gallbladder if there  is a high clinical concern for acute cholecystitis . The liver is unremarkable. Pancreas: Unremarkable. No pancreatic ductal dilatation or surrounding inflammatory changes. Spleen: Normal in size without focal abnormality. Adrenals/Urinary Tract: The adrenal glands are unremarkable. Subcentimeter bilateral renal hypodense lesions are too small to characterize but most likely represent cysts. There is no hydronephrosis on either side. The visualized ureters appear unremarkable. The urinary bladder is distended. Stomach/Bowel: Evaluation of the bowel is limited due to paucity of the intra-abdominal fat. The stomach is distended. Oral contrast is noted within the stomach and loops of small bowel. The colon is collapsed. Diffusely thickened appearance of the small bowel and colon may be related to underdistention. Enteritis or enterocolitis is not excluded. Clinical correlation is recommended. There is no evidence of bowel obstruction. The appendix is normal. Vascular/Lymphatic: There is  advanced aortoiliac atherosclerotic disease. The aorta is ectatic. The origins of the celiac axis, SMA, and IMA appear patent. The SMV, splenic vein, and main portal vein are patent. No portal venous gas identified. There is no adenopathy. Reproductive: The prostate and seminal vesicles are grossly unremarkable. Other: There is loss of subcutaneous fat and cachexia. Musculoskeletal: Degenerative changes of the spine. No acute fracture. IMPRESSION: 1. Distended gallbladder. No calcified stone seen on this CT. Stones however seen on today's ultrasound. A hepatobiliary scintigraphy may provide better evaluation of the gallbladder if there is a high clinical concern for acute cholecystitis . 2. Dilatation of the intrahepatic biliary tree and common bile duct, new compared to the prior study of indeterminate etiology. An ampullary lesion is not excluded. Further evaluation with MRCP recommended. 3. Distended stomach without evidence of mechanical gastric outlet obstruction. Findings may represent functional decreased peristalsis/gastroparesis. 4. No evidence of small-bowel obstruction. Thickened appearance of the small bowel and colon likely related to underdistention. Enterocolitis is not entirely excluded. 5. Pocket of air in the anterior mid abdomen likely intraluminal and air within the cecum. 6.  Aortic Atherosclerosis (ICD10-I70.0). Electronically Signed   By: Anner Crete M.D.   On: 08/17/2016 05:30   US Abdomen Limited Ruq  Result Date: 08/17/2016 CLINICAL DATA:  Abdominal pain and gallstones EXAM: ULTRASOUND ABDOMEN LIMITED RIGHT UPPER QUADRANT COMPARISON:  None. FINDINGS: Gallbladder: The gallbladder is distended with multiple stones that measure up to 8 mm. There is minimal wall thickening. A positive sonographic Percell Miller sign was demonstrated by the sonographer. No pericholecystic fluid. Common bile duct: Diameter: 14.3 mm, dilated. Liver: Moderate intrahepatic biliary dilatation. Liver parenchyma is  normal. IMPRESSION: 1. Positive sonographic Murphy sign and cholelithiasis, suggesting acute cholecystitis. 2. Dilated common bile duct and moderate intrahepatic biliary dilatation, but no choledocholithiasis is visible on this study. Electronically Signed   By: Ulyses Jarred M.D.   On: 08/17/2016 05:07        Scheduled Meds: . [MAR Hold] enoxaparin (LOVENOX) injection  40 mg Subcutaneous Q24H  . [MAR Hold] metoprolol succinate  12.5 mg Oral Daily  . [MAR Hold] nicotine  21 mg Transdermal Daily  . [MAR Hold] pantoprazole (PROTONIX) IV  40 mg Intravenous Q24H  . [MAR Hold] tamsulosin  0.4 mg Oral QPC breakfast   Continuous Infusions: . sodium chloride Stopped (08/18/16 0019)  . lactated ringers Stopped (08/18/16 1042)  . [MAR Hold] piperacillin-tazobactam (ZOSYN)  IV 3.375 g (08/18/16 0610)     LOS: 1 day    Time spent: Curry, MD [facility] Pager [phone]  If 7PM-7AM, please contact night-coverage [email or web address] Password [facility] 08/18/2016, 10:52 AM

## 2016-08-18 NOTE — Anesthesia Preprocedure Evaluation (Signed)
Anesthesia Evaluation  Patient identified by MRN, date of birth, ID band Patient awake    Reviewed: Allergy & Precautions, NPO status , Patient's Chart, lab work & pertinent test results  Airway Mallampati: II  TM Distance: >3 FB Neck ROM: Full    Dental no notable dental hx.    Pulmonary COPD, Current Smoker,    Pulmonary exam normal breath sounds clear to auscultation       Cardiovascular hypertension, + CAD and + CABG  Normal cardiovascular exam Rhythm:Regular Rate:Normal     Neuro/Psych negative neurological ROS  negative psych ROS   GI/Hepatic negative GI ROS, Neg liver ROS,   Endo/Other  negative endocrine ROS  Renal/GU negative Renal ROS  negative genitourinary   Musculoskeletal negative musculoskeletal ROS (+)   Abdominal   Peds negative pediatric ROS (+)  Hematology negative hematology ROS (+)   Anesthesia Other Findings   Reproductive/Obstetrics negative OB ROS                             Anesthesia Physical Anesthesia Plan  ASA: III  Anesthesia Plan: General   Post-op Pain Management:    Induction: Intravenous  PONV Risk Score and Plan: 1 and Ondansetron, Dexamethasone and Treatment may vary due to age or medical condition  Airway Management Planned: Oral ETT  Additional Equipment:   Intra-op Plan:   Post-operative Plan: Extubation in OR  Informed Consent: I have reviewed the patients History and Physical, chart, labs and discussed the procedure including the risks, benefits and alternatives for the proposed anesthesia with the patient or authorized representative who has indicated his/her understanding and acceptance.   Dental advisory given  Plan Discussed with: CRNA and Surgeon  Anesthesia Plan Comments:         Anesthesia Quick Evaluation

## 2016-08-18 NOTE — Consult Note (Signed)
[facility] Consult  Referring Provider: Waldron Session, MD Primary Care Physician:  [name], MD Primary Gastroenterologist: [name], MD  Reason for Consultation:  Dilated CBD, possible ampullary mass, abnormal LFTs  HPI: Todd Whitehead is a 71 y.o. male was transferred from [facility], with abnormal CBD. Patient states he was in his usual state of health, a few days prior to presentation, when he developed abdominal pain, most the upper and periumbilical, associated with nausea and multiple episodes of vomiting. Also reports fever as well as chills and shaking for the last 2 days. He was evaluated at the ER at [facility] with an ultrasound, which showed sonographic positive Murphy sign, cholelithiasis suggesting acute cholecystitis, dilated CBD, moderate intrahepatic biliary dilatation, no CBD stone on ultrasound. CT scan from 08/17/16 showed, distended gallbladder, dilated intrahepatic biliary tree and common bile duct measuring up to 18 mm, 11 mm at the pancreatic head, ampulla lesion not entirely excluded.  Patient denies yellowish discoloration of skin or sclera, change in the color of stool or urine. Patient denies unintentional weight loss, loss of appetite in recent weeks or months. He is a chronic smoker. No prior EGD. Colonoscopy from February 2018 showed multiple polyps, mostly tubular adenomas and few hyperplastic.    Past Medical History:  Diagnosis Date  . Anxiety   . Arthritis   . COPD (chronic obstructive pulmonary disease) ([place])    smoker  . Dysrhythmia   . Hypertension    on  no meds  . Melanoma ([place])    Resected from Left upper arm.     Past Surgical History:  Procedure Laterality Date  . BACK SURGERY  01/19/98  . CARDIAC CATHETERIZATION N/A 08/19/2015   Procedure: Left Heart Cath and Coronary Angiography;  Surgeon: [name], MD;  Location: [place] [facility];  Service:  Cardiovascular;  Laterality: N/A;  . COLON SURGERY  age 32 days old   " bowel was twisted up"  . COLONOSCOPY WITH PROPOFOL N/A 03/19/2016   Procedure: COLONOSCOPY WITH PROPOFOL;  Surgeon: [name], MD;  Location: [facility] ENDOSCOPY;  Service: Endoscopy;  Laterality: N/A;  . CORONARY ARTERY BYPASS GRAFT    . EYE SURGERY Left 03/26/11  . LUMBAR LAMINECTOMY/DECOMPRESSION MICRODISCECTOMY Right 10/02/2012   Procedure: Right Lumbar Five-Sacral One Lumbar laminotomy/Microdiskectomy;  Surgeon: [name], MD;  Location: [facility];  Service: Neurosurgery;  Laterality: Right;  Right Lumbar Five-Sacral One Lumbar laminotomy/Microdiskectomy    Prior to Admission medications   Medication Sig Start Date End Date Taking? Authorizing Provider  ALPRAZolam Duanne Moron) 1 MG tablet Take 1 mg by mouth 3 (three) times daily as needed for anxiety.   Yes [provider]  metoprolol succinate (TOPROL XL) 25 MG 24 hr tablet Take 12.5 mg by mouth daily.   Yes [provider]  tamsulosin (FLOMAX) 0.4 MG CAPS capsule Take 1 capsule (0.4 mg total) by mouth daily after breakfast. 10/03/12  Yes Jovita Gamma, MD    Current Facility-Administered Medications  Medication Dose Route Frequency Provider Last Rate Last Dose  . 0.9 %  sodium chloride infusion   Intravenous Continuous Waldron Session, MD   Stopped at 08/18/16 0019  . [MAR Hold] enoxaparin (LOVENOX) injection 40 mg  40 mg Subcutaneous Q24H Waldron Session, MD   40 mg at 08/17/16 2011  . lactated ringers infusion   Intravenous Continuous [name], MD 20 mL/hr at 08/18/16 0919    . [MAR Hold] metoprolol succinate (  TOPROL-XL) 24 hr tablet 12.5 mg  12.5 mg Oral Daily Waldron Session, MD      . Doug Sou Hold] morphine 4 MG/ML injection 2 mg  2 mg Intravenous Q3H PRN Waldron Session, MD   2 mg at 08/18/16 0843  . [MAR Hold] nicotine (NICODERM CQ - dosed in mg/24 hours) patch 21 mg  21 mg Transdermal Daily Waldron Session, MD   21 mg at 08/17/16 2012  . [MAR Hold]  ondansetron (ZOFRAN) injection 4 mg  4 mg Intravenous Q6H PRN Waldron Session, MD      . Doug Sou Hold] pantoprazole (PROTONIX) injection 40 mg  40 mg Intravenous Q24H Waldron Session, MD   40 mg at 08/17/16 2011  . [MAR Hold] piperacillin-tazobactam (ZOSYN) IVPB 3.375 g  3.375 g Intravenous Q8H Burnis Medin, Eiad, MD 12.5 mL/hr at 08/18/16 0610 3.375 g at 08/18/16 0610  . [MAR Hold] tamsulosin (FLOMAX) capsule 0.4 mg  0.4 mg Oral QPC breakfast Waldron Session, MD        Allergies as of 08/17/2016 - Review Complete 08/17/2016  Allergen Reaction Noted  . Other  10/02/2012    No family history on file.  Social History   Social History  . Marital status: Divorced    Spouse name: N/A  . Number of children: N/A  . Years of education: N/A   Occupational History  . Not on file.   Social History Main Topics  . Smoking status: Current Every Day Smoker    Packs/day: 2.00    Years: 56.00    Types: Cigarettes  . Smokeless tobacco: Never Used  . Alcohol use No  . Drug use: No  . Sexual activity: Not Currently   Other Topics Concern  . Not on file   Social History Narrative  . No narrative on file    Review of Systems: Positive for: GI: Described in detail in HPI.    Gen: Positive for fever, chills, rigors. Denies night sweats, anorexia, fatigue, weakness, malaise, involuntary weight loss, and sleep disorder CV: Denies chest pain, angina, palpitations, syncope, orthopnea, PND, peripheral edema, and claudication. Resp: Denies dyspnea, cough, sputum, wheezing, coughing up blood. GU : Denies urinary burning, blood in urine, urinary frequency, urinary hesitancy, nocturnal urination, and urinary incontinence. MS: Denies joint pain or swelling.  Denies muscle weakness, cramps, atrophy.  Derm: Denies rash, itching, oral ulcerations, hives, unhealing ulcers.  Psych: Denies depression, anxiety, memory loss, suicidal ideation, hallucinations,  and confusion. Heme: Denies bruising, bleeding, and enlarged lymph  nodes. Neuro:  Denies any headaches, dizziness, paresthesias. Endo:  Denies any problems with DM, thyroid, adrenal function.  Physical Exam: Vital signs in last 24 hours: Temp:  [98 F (36.7 C)-99.8 F (37.7 C)] 99.8 F (37.7 C) (07/21 0914) Pulse Rate:  [82-92] 92 (07/21 0914) Resp:  [16-20] 20 (07/21 0914) BP: (90-149)/(46-60) 149/60 (07/21 0914) SpO2:  [90 %-98 %] 92 % (07/21 0914) Weight:  [54.7 kg (120 lb 9.5 oz)-57.2 kg (126 lb 1.6 oz)] 54.7 kg (120 lb 9.5 oz) (07/20 1542) Last BM Date: 08/16/16  General:   Alert,  Well-developed, well-nourished, pleasant and cooperative in NAD Head:  Normocephalic and atraumatic. Eyes:  Sclera clear, mild icterus icterus.   Mild pallor. Ears:  Normal auditory acuity. Nose:  No deformity, discharge,  or lesions. Mouth:  No deformity or lesions.  Oropharynx pink & moist. Neck:  Supple; no masses or thyromegaly. Lungs:  Clear throughout to auscultation.   No wheezes, crackles, or rhonchi. No acute distress. Heart:  Regular  rate and rhythm; no murmurs, clicks, rubs,  or gallops. Extremities:  Without clubbing or edema. Neurologic:  Alert and  oriented x4;  grossly normal neurologically. Skin:  Intact without significant lesions or rashes. Psych:  Alert and cooperative. Normal mood and affect. Abdomen:  Mild generalized tenderness , most prominent in upper abdomen and right upper quadrant ,Soft,and nondistended. No masses, hepatosplenomegaly or hernias noted. Normal bowel sounds, voluntary guarding noted.       Lab Results:  Recent Labs  08/17/16 0245 08/17/16 1825 08/18/16 0344  WBC 10.8* 15.1* 11.5*  HGB 13.0 10.9* 10.5*  HCT 37.5* 31.3* 31.1*  PLT 191 122* 113*   BMET  Recent Labs  08/17/16 0245 08/17/16 1825 08/18/16 0344  NA 135  --  133*  K 3.2*  --  3.6  CL 98*  --  102  CO2 25  --  25  GLUCOSE 186*  --  82  BUN 14  --  13  CREATININE 1.03 1.07 1.07  CALCIUM 9.5  --  7.9*   LFT  Recent Labs  08/18/16 0344   PROT 5.2*  ALBUMIN 2.8*  AST 284*  ALT 410*  ALKPHOS 110  BILITOT 2.1*   PT/INR No results for input(s): LABPROT, INR in the last 72 hours.  Studies/Results: Ct Abdomen Pelvis W Contrast  Result Date: 08/17/2016 CLINICAL DATA:  71 year old male with lower abdominal pain. EXAM: CT ABDOMEN AND PELVIS WITH CONTRAST TECHNIQUE: Multidetector CT imaging of the abdomen and pelvis was performed using the standard protocol following bolus administration of intravenous contrast. CONTRAST:  <See Chart> ISOVUE-300 IOPAMIDOL (ISOVUE-300) INJECTION 61%, 140mL ISOVUE-300 IOPAMIDOL (ISOVUE-300) INJECTION 61% COMPARISON:  Abdominal CT dated 12/16/2015 FINDINGS: Evaluation is very limited due to paucity of intra-abdominal fat. Lower chest: The visualized lung bases are clear. No intra-abdominal free fluid. A pocket of air in the anterior mid abdomen (series 2 image 42) is most likely intraluminal and represents air in the cecum. Although evaluation is very limited due to paucity of intra-abdominal fat. Pneumoperitoneum is much less likely. Correlation with clinical exam recommended. Hepatobiliary: There is moderate dilatation of the intrahepatic biliary tree, new compared to the prior CT. There is dilatation of the common bile duct measuring up to 18 mm with dilatation of the central CBD at the head of the pancreas measuring up to 11 mm. This is new compared to the prior CT. No calcified stone identified within the CBD. An ampullary lesion is not entirely excluded. Further evaluation with MRCP is recommended. The gallbladder is moderately distended. No calcified stone is seen within the gallbladder on the CT. Small stones noted on today's ultrasound. Clinical correlation is recommended. A hepatobiliary scintigraphy may provide better evaluation of the gallbladder if there is a high clinical concern for acute cholecystitis . The liver is unremarkable. Pancreas: Unremarkable. No pancreatic ductal dilatation or  surrounding inflammatory changes. Spleen: Normal in size without focal abnormality. Adrenals/Urinary Tract: The adrenal glands are unremarkable. Subcentimeter bilateral renal hypodense lesions are too small to characterize but most likely represent cysts. There is no hydronephrosis on either side. The visualized ureters appear unremarkable. The urinary bladder is distended. Stomach/Bowel: Evaluation of the bowel is limited due to paucity of the intra-abdominal fat. The stomach is distended. Oral contrast is noted within the stomach and loops of small bowel. The colon is collapsed. Diffusely thickened appearance of the small bowel and colon may be related to underdistention. Enteritis or enterocolitis is not excluded. Clinical correlation is recommended. There is no evidence  of bowel obstruction. The appendix is normal. Vascular/Lymphatic: There is advanced aortoiliac atherosclerotic disease. The aorta is ectatic. The origins of the celiac axis, SMA, and IMA appear patent. The SMV, splenic vein, and main portal vein are patent. No portal venous gas identified. There is no adenopathy. Reproductive: The prostate and seminal vesicles are grossly unremarkable. Other: There is loss of subcutaneous fat and cachexia. Musculoskeletal: Degenerative changes of the spine. No acute fracture. IMPRESSION: 1. Distended gallbladder. No calcified stone seen on this CT. Stones however seen on today's ultrasound. A hepatobiliary scintigraphy may provide better evaluation of the gallbladder if there is a high clinical concern for acute cholecystitis . 2. Dilatation of the intrahepatic biliary tree and common bile duct, new compared to the prior study of indeterminate etiology. An ampullary lesion is not excluded. Further evaluation with MRCP recommended. 3. Distended stomach without evidence of mechanical gastric outlet obstruction. Findings may represent functional decreased peristalsis/gastroparesis. 4. No evidence of small-bowel  obstruction. Thickened appearance of the small bowel and colon likely related to underdistention. Enterocolitis is not entirely excluded. 5. Pocket of air in the anterior mid abdomen likely intraluminal and air within the cecum. 6.  Aortic Atherosclerosis (ICD10-I70.0). Electronically Signed   By: Anner Crete M.D.   On: 08/17/2016 05:30   US Abdomen Limited Ruq  Result Date: 08/17/2016 CLINICAL DATA:  Abdominal pain and gallstones EXAM: ULTRASOUND ABDOMEN LIMITED RIGHT UPPER QUADRANT COMPARISON:  None. FINDINGS: Gallbladder: The gallbladder is distended with multiple stones that measure up to 8 mm. There is minimal wall thickening. A positive sonographic Percell Miller sign was demonstrated by the sonographer. No pericholecystic fluid. Common bile duct: Diameter: 14.3 mm, dilated. Liver: Moderate intrahepatic biliary dilatation. Liver parenchyma is normal. IMPRESSION: 1. Positive sonographic Murphy sign and cholelithiasis, suggesting acute cholecystitis. 2. Dilated common bile duct and moderate intrahepatic biliary dilatation, but no choledocholithiasis is visible on this study. Electronically Signed   By: Ulyses Jarred M.D.   On: 08/17/2016 05:07    Impression: 1. Dilated CBD, abnormal LFTs, gallstones, possible ampullary lesion 2. Leukocytosis, currently afebrile, no hypotension or tachycardia 3. On IV Zosyn  Plan: 1. Diagnostic ERCP 2. If ampullary lesion noted, perform biopsies 3. Plan on sphincterotomy and balloon sweep, to a certain if there is any CBD obstruction. 4. Surgical evaluation being done for cholecystectomy   LOS: 1 day   [name], M.D.  08/18/2016, 9:20 AM  Pager [phone] If no answer or after 5 PM call [phone]

## 2016-08-18 NOTE — Transfer of Care (Signed)
Immediate Anesthesia Transfer of Care Note  Patient: Todd Whitehead  Procedure(s) Performed: Procedure(s): ENDOSCOPIC RETROGRADE CHOLANGIOPANCREATOGRAPHY (ERCP) (N/A)  Patient Location: PACU  Anesthesia Type:General  Level of Consciousness: awake and patient cooperative  Airway & Oxygen Therapy: Patient Spontanous Breathing and Patient connected to face mask oxygen  Post-op Assessment: Report given to RN and Post -op Vital signs reviewed and stable  Post vital signs: Reviewed and stable  Last Vitals:  Vitals:   08/18/16 0512 08/18/16 0914  BP: (!) 109/51 (!) 149/60  Pulse: 85 92  Resp: 18 20  Temp: 37.6 C 37.7 C    Last Pain:  Vitals:   08/18/16 0914  TempSrc: Oral  PainSc: 4       Patients Stated Pain Goal: 2 (76/[id number]/41 [id number])  Complications: No apparent anesthesia complications

## 2016-08-18 NOTE — Brief Op Note (Signed)
08/17/2016 - 08/18/2016  10:27 AM  PATIENT:  Todd Whitehead  71 y.o. male  PRE-OPERATIVE DIAGNOSIS:  abnornal LFTs, dilated CBD, gallstones  POST-OPERATIVE DIAGNOSIS:  unable to cannulate ampulla  PROCEDURE:  Procedure(s): ENDOSCOPIC RETROGRADE CHOLANGIOPANCREATOGRAPHY (ERCP) (N/A)  SURGEON:  Surgeon(s) and Role:    [name], MD - Primary  PHYSICIAN ASSISTANT:   ASSISTANTS: none   ANESTHESIA:   MAC  EBL:  No intake/output data recorded.  BLOOD ADMINISTERED:none  DRAINS: none   LOCAL MEDICATIONS USED:  NONE  SPECIMEN:  No Specimen  DISPOSITION OF SPECIMEN:  N/A  COUNTS:  YES  TOURNIQUET:  * No tourniquets in log *  DICTATION: .Dragon Dictation  PLAN OF CARE: Admit to inpatient   PATIENT DISPOSITION:  PACU - hemodynamically stable.   Delay start of Pharmacological VTE agent (>24hrs) due to surgical blood loss or risk of bleeding: no

## 2016-08-18 NOTE — Op Note (Signed)
ERCP was attempted. The major cannula could not be identified despite several attempts. Bile was noted in the duodenum. No obvious mass or bulging noted. Recommend MRCP. Patient needs cholecystectomy, recommend intraoperative cholangiogram. If there is any obstruction noted in CBD during cholangiogram, rendezvous approach may be needed to cannulate the CBD. Further recommendations depend on, findings of MRCP, and intraoperative cholangiogram.

## 2016-08-18 NOTE — Progress Notes (Signed)
Day of Surgery   Subjective/Chief Complaint: No change in abdominal pain this morning.    Objective: Vital signs in last 24 hours: Temp:  [98 F (36.7 C)-99.6 F (37.6 C)] 99.6 F (37.6 C) (07/21 0512) Pulse Rate:  [82-87] 85 (07/21 0512) Resp:  [16-18] 18 (07/21 0512) BP: (90-116)/(46-58) 109/51 (07/21 0512) SpO2:  [90 %-98 %] 97 % (07/21 0512) Weight:  [54.7 kg (120 lb 9.5 oz)-57.2 kg (126 lb 1.6 oz)] 54.7 kg (120 lb 9.5 oz) (07/20 1542) Last BM Date: 08/16/16  Intake/Output from previous day: 07/20 0701 - 07/21 0700 In: 941.3 [P.O.:60; I.V.:831.3; IV Piggyback:50] Out: 1300 [Urine:1300] Intake/Output this shift: No intake/output data recorded.  General appearance: alert and cooperative Resp: clear to auscultation bilaterally GI: soft, nondistended, tender moreso in the lower half of the abdomen. right paramedian scar. Skin: Skin color, texture, turgor normal. No rashes or lesions  Lab Results:   Recent Labs  08/17/16 1825 08/18/16 0344  WBC 15.1* 11.5*  HGB 10.9* 10.5*  HCT 31.3* 31.1*  PLT 122* 113*   BMET  Recent Labs  08/17/16 0245 08/17/16 1825 08/18/16 0344  NA 135  --  133*  K 3.2*  --  3.6  CL 98*  --  102  CO2 25  --  25  GLUCOSE 186*  --  82  BUN 14  --  13  CREATININE 1.03 1.07 1.07  CALCIUM 9.5  --  7.9*   PT/INR No results for input(s): LABPROT, INR in the last 72 hours. ABG No results for input(s): PHART, HCO3 in the last 72 hours.  Invalid input(s): PCO2, PO2  Studies/Results: Ct Abdomen Pelvis W Contrast  Result Date: 08/17/2016 CLINICAL DATA:  71 year old male with lower abdominal pain. EXAM: CT ABDOMEN AND PELVIS WITH CONTRAST TECHNIQUE: Multidetector CT imaging of the abdomen and pelvis was performed using the standard protocol following bolus administration of intravenous contrast. CONTRAST:  <See Chart> ISOVUE-300 IOPAMIDOL (ISOVUE-300) INJECTION 61%, 152mL ISOVUE-300 IOPAMIDOL (ISOVUE-300) INJECTION 61% COMPARISON:  Abdominal  CT dated 12/16/2015 FINDINGS: Evaluation is very limited due to paucity of intra-abdominal fat. Lower chest: The visualized lung bases are clear. No intra-abdominal free fluid. A pocket of air in the anterior mid abdomen (series 2 image 42) is most likely intraluminal and represents air in the cecum. Although evaluation is very limited due to paucity of intra-abdominal fat. Pneumoperitoneum is much less likely. Correlation with clinical exam recommended. Hepatobiliary: There is moderate dilatation of the intrahepatic biliary tree, new compared to the prior CT. There is dilatation of the common bile duct measuring up to 18 mm with dilatation of the central CBD at the head of the pancreas measuring up to 11 mm. This is new compared to the prior CT. No calcified stone identified within the CBD. An ampullary lesion is not entirely excluded. Further evaluation with MRCP is recommended. The gallbladder is moderately distended. No calcified stone is seen within the gallbladder on the CT. Small stones noted on today's ultrasound. Clinical correlation is recommended. A hepatobiliary scintigraphy may provide better evaluation of the gallbladder if there is a high clinical concern for acute cholecystitis . The liver is unremarkable. Pancreas: Unremarkable. No pancreatic ductal dilatation or surrounding inflammatory changes. Spleen: Normal in size without focal abnormality. Adrenals/Urinary Tract: The adrenal glands are unremarkable. Subcentimeter bilateral renal hypodense lesions are too small to characterize but most likely represent cysts. There is no hydronephrosis on either side. The visualized ureters appear unremarkable. The urinary bladder is distended. Stomach/Bowel: Evaluation of  the bowel is limited due to paucity of the intra-abdominal fat. The stomach is distended. Oral contrast is noted within the stomach and loops of small bowel. The colon is collapsed. Diffusely thickened appearance of the small bowel and colon  may be related to underdistention. Enteritis or enterocolitis is not excluded. Clinical correlation is recommended. There is no evidence of bowel obstruction. The appendix is normal. Vascular/Lymphatic: There is advanced aortoiliac atherosclerotic disease. The aorta is ectatic. The origins of the celiac axis, SMA, and IMA appear patent. The SMV, splenic vein, and main portal vein are patent. No portal venous gas identified. There is no adenopathy. Reproductive: The prostate and seminal vesicles are grossly unremarkable. Other: There is loss of subcutaneous fat and cachexia. Musculoskeletal: Degenerative changes of the spine. No acute fracture. IMPRESSION: 1. Distended gallbladder. No calcified stone seen on this CT. Stones however seen on today's ultrasound. A hepatobiliary scintigraphy may provide better evaluation of the gallbladder if there is a high clinical concern for acute cholecystitis . 2. Dilatation of the intrahepatic biliary tree and common bile duct, new compared to the prior study of indeterminate etiology. An ampullary lesion is not excluded. Further evaluation with MRCP recommended. 3. Distended stomach without evidence of mechanical gastric outlet obstruction. Findings may represent functional decreased peristalsis/gastroparesis. 4. No evidence of small-bowel obstruction. Thickened appearance of the small bowel and colon likely related to underdistention. Enterocolitis is not entirely excluded. 5. Pocket of air in the anterior mid abdomen likely intraluminal and air within the cecum. 6.  Aortic Atherosclerosis (ICD10-I70.0). Electronically Signed   By: Anner Crete M.D.   On: 08/17/2016 05:30   US Abdomen Limited Ruq  Result Date: 08/17/2016 CLINICAL DATA:  Abdominal pain and gallstones EXAM: ULTRASOUND ABDOMEN LIMITED RIGHT UPPER QUADRANT COMPARISON:  None. FINDINGS: Gallbladder: The gallbladder is distended with multiple stones that measure up to 8 mm. There is minimal wall thickening. A  positive sonographic Percell Miller sign was demonstrated by the sonographer. No pericholecystic fluid. Common bile duct: Diameter: 14.3 mm, dilated. Liver: Moderate intrahepatic biliary dilatation. Liver parenchyma is normal. IMPRESSION: 1. Positive sonographic Murphy sign and cholelithiasis, suggesting acute cholecystitis. 2. Dilated common bile duct and moderate intrahepatic biliary dilatation, but no choledocholithiasis is visible on this study. Electronically Signed   By: Ulyses Jarred M.D.   On: 08/17/2016 05:07    Anti-infectives: Anti-infectives    Start     Dose/Rate Route Frequency Ordered Stop   08/17/16 2300  piperacillin-tazobactam (ZOSYN) IVPB 3.375 g     3.375 g 12.5 mL/hr over 240 Minutes Intravenous Every 8 hours 08/17/16 1648     08/17/16 1800  piperacillin-tazobactam (ZOSYN) IVPB 3.375 g  Status:  Discontinued     3.375 g 100 mL/hr over 30 Minutes Intravenous Every 6 hours 08/17/16 1641 08/17/16 1648      Assessment/Plan: s/p Procedure(s): ENDOSCOPIC RETROGRADE CHOLANGIOPANCREATOGRAPHY (ERCP) (N/A) Looks like he is going for ERCP today. Labs downtrending. Will continue to follow anticipating lap chole this admission.    LOS: 1 day    Todd Whitehead 08/18/2016

## 2016-08-18 NOTE — Anesthesia Postprocedure Evaluation (Signed)
Anesthesia Post Note  Patient: Todd Whitehead  Procedure(s) Performed: Procedure(s) (LRB): ENDOSCOPIC RETROGRADE CHOLANGIOPANCREATOGRAPHY (ERCP) (N/A)     Patient location during evaluation: PACU Anesthesia Type: General Level of consciousness: awake and alert Pain management: pain level controlled Vital Signs Assessment: post-procedure vital signs reviewed and stable Respiratory status: spontaneous breathing, nonlabored ventilation, respiratory function stable and patient connected to nasal cannula oxygen Cardiovascular status: blood pressure returned to baseline and stable Postop Assessment: no signs of nausea or vomiting Anesthetic complications: no    Last Vitals:  Vitals:   08/18/16 1100 08/18/16 1103  BP: (!) 96/55 (!) 94/51  Pulse: 95 92  Resp: (!) 22 20  Temp:      Last Pain:  Vitals:   08/18/16 1100  TempSrc:   PainSc: 0-No pain                 [name] S

## 2016-08-19 ENCOUNTER — Inpatient Hospital Stay (HOSPITAL_COMMUNITY): Payer: Medicare Other | Admitting: Certified Registered"

## 2016-08-19 ENCOUNTER — Inpatient Hospital Stay (HOSPITAL_COMMUNITY): Payer: Medicare Other

## 2016-08-19 ENCOUNTER — Encounter (HOSPITAL_COMMUNITY): Payer: Self-pay

## 2016-08-19 ENCOUNTER — Encounter (HOSPITAL_COMMUNITY)
Admission: AD | Disposition: A | Discharge status: Home / Self Care | Payer: Self-pay | Source: Other Acute Inpatient Hospital | Attending: Internal Medicine

## 2016-08-19 HISTORY — PX: CHOLECYSTECTOMY: SHX55

## 2016-08-19 LAB — SURGICAL PCR SCREEN
MRSA, PCR: NEGATIVE
STAPHYLOCOCCUS AUREUS: NEGATIVE

## 2016-08-19 SURGERY — LAPAROSCOPIC CHOLECYSTECTOMY WITH INTRAOPERATIVE CHOLANGIOGRAM
Anesthesia: General | Site: Abdomen

## 2016-08-19 MED ORDER — BUPIVACAINE-EPINEPHRINE 0.25% -1:200000 IJ SOLN
INTRAMUSCULAR | Status: AC
  Filled 2016-08-19: qty 1

## 2016-08-19 MED ORDER — CEFAZOLIN SODIUM-DEXTROSE 2-4 GM/100ML-% IV SOLN
INTRAVENOUS | Status: AC
  Filled 2016-08-19: qty 100

## 2016-08-19 MED ORDER — SODIUM CHLORIDE 0.9 % IV SOLN
INTRAVENOUS | Status: DC | PRN
  Administered 2016-08-19: 10 mL

## 2016-08-19 MED ORDER — SUGAMMADEX SODIUM 200 MG/2ML IV SOLN
INTRAVENOUS | Status: DC | PRN
  Administered 2016-08-19: 109.4 mg via INTRAVENOUS

## 2016-08-19 MED ORDER — SODIUM CHLORIDE 0.9 % IR SOLN
Status: DC | PRN
  Administered 2016-08-19: 1000 mL

## 2016-08-19 MED ORDER — ROCURONIUM BROMIDE 50 MG/5ML IV SOLN
INTRAVENOUS | Status: AC
  Filled 2016-08-19: qty 1

## 2016-08-19 MED ORDER — KETOROLAC TROMETHAMINE 15 MG/ML IJ SOLN
15.0000 mg | Freq: Once | INTRAMUSCULAR | Status: AC
  Administered 2016-08-19: 15 mg via INTRAVENOUS

## 2016-08-19 MED ORDER — KETOROLAC TROMETHAMINE 15 MG/ML IJ SOLN
INTRAMUSCULAR | Status: AC
  Filled 2016-08-19: qty 1

## 2016-08-19 MED ORDER — IOPAMIDOL (ISOVUE-300) INJECTION 61%
INTRAVENOUS | Status: AC
  Filled 2016-08-19: qty 50

## 2016-08-19 MED ORDER — GLYCOPYRROLATE 0.2 MG/ML IJ SOLN
INTRAMUSCULAR | Status: DC | PRN
  Administered 2016-08-19: 0.2 mg via INTRAVENOUS

## 2016-08-19 MED ORDER — ROCURONIUM BROMIDE 100 MG/10ML IV SOLN
INTRAVENOUS | Status: DC | PRN
  Administered 2016-08-19: 40 mg via INTRAVENOUS

## 2016-08-19 MED ORDER — PROPOFOL 10 MG/ML IV BOLUS
INTRAVENOUS | Status: AC
  Filled 2016-08-19: qty 20

## 2016-08-19 MED ORDER — FENTANYL CITRATE (PF) 250 MCG/5ML IJ SOLN
INTRAMUSCULAR | Status: AC
  Filled 2016-08-19: qty 5

## 2016-08-19 MED ORDER — FENTANYL CITRATE (PF) 100 MCG/2ML IJ SOLN
25.0000 ug | INTRAMUSCULAR | Status: DC | PRN
  Administered 2016-08-19 (×4): 25 ug via INTRAVENOUS

## 2016-08-19 MED ORDER — LIDOCAINE HCL (CARDIAC) 20 MG/ML IV SOLN
INTRAVENOUS | Status: DC | PRN
  Administered 2016-08-19: 100 mg via INTRAVENOUS

## 2016-08-19 MED ORDER — OXYCODONE HCL 5 MG PO TABS
5.0000 mg | ORAL_TABLET | ORAL | Status: DC | PRN
Start: 2016-08-19 — End: 2016-08-20
  Administered 2016-08-19 – 2016-08-20 (×2): 10 mg via ORAL
  Filled 2016-08-19 (×2): qty 2

## 2016-08-19 MED ORDER — ACETAMINOPHEN 10 MG/ML IV SOLN
INTRAVENOUS | Status: AC
  Filled 2016-08-19: qty 100

## 2016-08-19 MED ORDER — CEFAZOLIN SODIUM-DEXTROSE 2-3 GM-% IV SOLR
INTRAVENOUS | Status: DC | PRN
  Administered 2016-08-19: 2 g via INTRAVENOUS

## 2016-08-19 MED ORDER — ACETAMINOPHEN 10 MG/ML IV SOLN
INTRAVENOUS | Status: DC | PRN
  Administered 2016-08-19: 5 mg via INTRAVENOUS

## 2016-08-19 MED ORDER — PROPOFOL 10 MG/ML IV BOLUS
INTRAVENOUS | Status: DC | PRN
  Administered 2016-08-19: 80 mg via INTRAVENOUS

## 2016-08-19 MED ORDER — PHENYLEPHRINE HCL 10 MG/ML IJ SOLN
INTRAMUSCULAR | Status: DC | PRN
  Administered 2016-08-19: 15 ug/min via INTRAVENOUS

## 2016-08-19 MED ORDER — LACTATED RINGERS IV SOLN
INTRAVENOUS | Status: DC | PRN
  Administered 2016-08-19: 14:00:00 via INTRAVENOUS

## 2016-08-19 MED ORDER — 0.9 % SODIUM CHLORIDE (POUR BTL) OPTIME
TOPICAL | Status: DC | PRN
  Administered 2016-08-19: 1000 mL

## 2016-08-19 MED ORDER — FENTANYL CITRATE (PF) 100 MCG/2ML IJ SOLN
INTRAMUSCULAR | Status: DC | PRN
  Administered 2016-08-19 (×4): 50 ug via INTRAVENOUS

## 2016-08-19 MED ORDER — FENTANYL CITRATE (PF) 100 MCG/2ML IJ SOLN
INTRAMUSCULAR | Status: AC
  Filled 2016-08-19: qty 2

## 2016-08-19 MED ORDER — MIDAZOLAM HCL 2 MG/2ML IJ SOLN
INTRAMUSCULAR | Status: AC
  Filled 2016-08-19: qty 2

## 2016-08-19 MED ORDER — MIDAZOLAM HCL 5 MG/5ML IJ SOLN
INTRAMUSCULAR | Status: DC | PRN
  Administered 2016-08-19: 1 mg via INTRAVENOUS

## 2016-08-19 MED ORDER — PNEUMOCOCCAL VAC POLYVALENT 25 MCG/0.5ML IJ INJ: 0.5000 mL | INJECTION | INTRAMUSCULAR | Status: DC

## 2016-08-19 MED ORDER — LIDOCAINE HCL 1 % IJ SOLN
INTRAMUSCULAR | Status: DC | PRN
  Administered 2016-08-19: 7 mL

## 2016-08-19 MED ORDER — ONDANSETRON HCL 4 MG/2ML IJ SOLN
INTRAMUSCULAR | Status: DC | PRN
  Administered 2016-08-19: 4 mg via INTRAVENOUS

## 2016-08-19 MED ORDER — ALBUMIN HUMAN 5 % IV SOLN
INTRAVENOUS | Status: DC | PRN
  Administered 2016-08-19: 15:00:00 via INTRAVENOUS

## 2016-08-19 SURGICAL SUPPLY — 47 items
ADH SKN CLS APL DERMABOND .7 (GAUZE/BANDAGES/DRESSINGS) ×1
APPLIER CLIP ROT 10 11.4 M/L (STAPLE) ×2
APR CLP MED LRG 11.4X10 (STAPLE) ×1
BAG SPEC RTRVL LRG 6X4 10 (ENDOMECHANICALS) ×1
BLADE CLIPPER SURG (BLADE) ×1 IMPLANT
CANISTER SUCT 3000ML PPV (MISCELLANEOUS) ×2 IMPLANT
CHLORAPREP W/TINT 26ML (MISCELLANEOUS) ×2 IMPLANT
CLIP APPLIE ROT 10 11.4 M/L (STAPLE) ×1 IMPLANT
COVER MAYO STAND STRL (DRAPES) ×2 IMPLANT
COVER SURGICAL LIGHT HANDLE (MISCELLANEOUS) ×2 IMPLANT
DERMABOND ADVANCED (GAUZE/BANDAGES/DRESSINGS) ×1
DERMABOND ADVANCED .7 DNX12 (GAUZE/BANDAGES/DRESSINGS) ×1 IMPLANT
DRAPE C-ARM 42X72 X-RAY (DRAPES) ×1 IMPLANT
DRAPE WARM FLUID 44X44 (DRAPE) ×2 IMPLANT
ELECT REM PT RETURN 9FT ADLT (ELECTROSURGICAL) ×2
ELECTRODE REM PT RTRN 9FT ADLT (ELECTROSURGICAL) ×1 IMPLANT
ENDOLOOP SUT PDS II  0 18 (SUTURE) ×1
ENDOLOOP SUT PDS II 0 18 (SUTURE) IMPLANT
FILTER SMOKE EVAC LAPAROSHD (FILTER) IMPLANT
GLOVE BIO SURGEON STRL SZ 6 (GLOVE) ×2 IMPLANT
GLOVE BIOGEL PI IND STRL 6.5 (GLOVE) ×1 IMPLANT
GLOVE BIOGEL PI INDICATOR 6.5 (GLOVE) ×1
GOWN STRL REUS W/ TWL LRG LVL3 (GOWN DISPOSABLE) ×2 IMPLANT
GOWN STRL REUS W/TWL 2XL LVL3 (GOWN DISPOSABLE) ×2 IMPLANT
GOWN STRL REUS W/TWL LRG LVL3 (GOWN DISPOSABLE) ×4
KIT BASIN OR (CUSTOM PROCEDURE TRAY) ×2 IMPLANT
KIT ROOM TURNOVER OR (KITS) ×2 IMPLANT
L-HOOK LAP DISP 36CM (ELECTROSURGICAL) ×2
LHOOK LAP DISP 36CM (ELECTROSURGICAL) ×1 IMPLANT
NS IRRIG 1000ML POUR BTL (IV SOLUTION) ×2 IMPLANT
PAD ARMBOARD 7.5X6 YLW CONV (MISCELLANEOUS) ×2 IMPLANT
PENCIL BUTTON HOLSTER BLD 10FT (ELECTRODE) ×2 IMPLANT
POUCH SPECIMEN RETRIEVAL 10MM (ENDOMECHANICALS) ×2 IMPLANT
SCISSORS LAP 5X35 DISP (ENDOMECHANICALS) ×2 IMPLANT
SET CHOLANGIOGRAPH 5 50 .035 (SET/KITS/TRAYS/PACK) ×3 IMPLANT
SET IRRIG TUBING LAPAROSCOPIC (IRRIGATION / IRRIGATOR) ×2 IMPLANT
SLEEVE ENDOPATH XCEL 5M (ENDOMECHANICALS) ×2 IMPLANT
SPECIMEN JAR SMALL (MISCELLANEOUS) ×2 IMPLANT
SUT MNCRL AB 4-0 PS2 18 (SUTURE) ×2 IMPLANT
SUT VICRYL 0 UR6 27IN ABS (SUTURE) ×1 IMPLANT
TOWEL OR 17X24 6PK STRL BLUE (TOWEL DISPOSABLE) ×2 IMPLANT
TOWEL OR 17X26 10 PK STRL BLUE (TOWEL DISPOSABLE) ×2 IMPLANT
TRAY LAPAROSCOPIC MC (CUSTOM PROCEDURE TRAY) ×2 IMPLANT
TROCAR XCEL BLUNT TIP 100MML (ENDOMECHANICALS) ×2 IMPLANT
TROCAR XCEL NON-BLD 11X100MML (ENDOMECHANICALS) ×2 IMPLANT
TROCAR XCEL NON-BLD 5MMX100MML (ENDOMECHANICALS) ×2 IMPLANT
TUBING INSUFFLATION (TUBING) ×2 IMPLANT

## 2016-08-19 NOTE — Op Note (Signed)
Laparoscopic Cholecystectomy with IOC Procedure Note  Indications: This patient presents with acute cholecystitis with elevated LFTs and will undergo laparoscopic cholecystectomy.  Pre op MRCP did not show a stone.  Pre-operative Diagnosis: acute calculous cholecystitis.    Post-operative Diagnosis: Same  Surgeon: [name]   Assistants: n/a  Anesthesia: General endotracheal anesthesia and local  ASA Class: 3  Procedure Details  The patient was seen again in the Holding Room. The risks, benefits, complications, treatment options, and expected outcomes were discussed with the patient. The possibilities of  bleeding, recurrent infection, damage to nearby structures, the need for additional procedures, failure to diagnose a condition, the possible need to convert to an open procedure, and creating a complication requiring transfusion or operation were discussed with the patient. The likelihood of improving the patient's symptoms with return to their baseline status is good.    The patient and/or family concurred with the proposed plan, giving informed consent. The site of surgery properly noted. The patient was taken to Operating Room, and the procedure verified as Laparoscopic Cholecystectomy with Intraoperative Cholangiogram. A Time Out was held and the above information confirmed.  Prior to the induction of general anesthesia, antibiotic prophylaxis was administered. General endotracheal anesthesia was then administered and tolerated well. After the induction, the abdomen was prepped with Chloraprep and draped in the sterile fashion. The patient was positioned in the supine position.  Local anesthetic agent was injected into the skin near the umbilicus and an incision made. We dissected down to the abdominal fascia with blunt dissection.  The fascia was incised vertically and we entered the peritoneal cavity bluntly.  A pursestring suture of 0-Vicryl was placed around the fascial opening.   The Hasson cannula was inserted and secured with the stay suture.  Pneumoperitoneum was then created with CO2 and tolerated well without any adverse changes in the patient's vital signs. An 11-mm port was placed in the subxiphoid position.  Two 5-mm ports were placed in the right upper quadrant. All skin incisions were infiltrated with a local anesthetic agent before making the incision and placing the trocars.   We positioned the patient in reverse Trendelenburg, tilted slightly to the patient's left.  The gallbladder was identified, the fundus grasped and retracted cephalad. Adhesions were lysed bluntly and with the electrocautery where indicated, taking care not to injure any adjacent organs or viscus. The infundibulum was grasped and retracted laterally, exposing the peritoneum overlying the triangle of Calot. This was then divided and exposed in a blunt fashion. A critical view of the cystic duct and cystic artery was obtained.  The cystic duct was clearly identified and bluntly dissected circumferentially. The cystic duct was ligated with a clip distally.   An incision was made in the cystic duct and the [facility] cholangiogram catheter introduced. The catheter was secured using a clip. A cholangiogram was then performed, demonstrating a dilated and tortuous cystic duct and a dilated common bile duct.  Right and left hepatic ducts were visible.  Initially, it did not appear that he duodenum was going to fill, but we did see the duodenum at the end of the cholangiogram.  The cystic duct was then ligated with clips and divided. An Endoloop was placed on the cystic duct below the clips since the duct was dilated. The cystic artery was identified, dissected free, ligated with clips and divided as well.   The gallbladder was dissected from the liver bed in retrograde fashion with the electrocautery. The gallbladder was removed and placed in  an Endocatch bag.  The gallbladder and Endocatch bag were then removed  through the umbilical port site.  The liver bed was irrigated and inspected. Hemostasis was achieved with the electrocautery. Copious irrigation was utilized and was repeatedly aspirated until clear.    We again inspected the right upper quadrant for hemostasis.  Pneumoperitoneum was released as we removed the trocars.   The pursestring suture was used to close the umbilical fascia.  4-0 Monocryl was used to close the skin.   The skin was cleaned and dry, and Dermabond was applied. The patient was then extubated and brought to the recovery room in stable condition. Instrument, sponge, and needle counts were correct at closure and at the conclusion of the case.   Findings: Dilated CBD and cystic duct, but filling of duodenum.  Very distended gallbladder.  Minimal adhesions from prior surgery.    Estimated Blood Loss: min         Drains: none          Specimens: Gallbladder to pathology       Complications: None; patient tolerated the procedure well.         Disposition: PACU - hemodynamically stable.         Condition: stable

## 2016-08-19 NOTE — Progress Notes (Signed)
  Progress Note: General Surgery Service   Assessment/Plan: Patient Active Problem List   Diagnosis Date Noted  . Bile duct calculus with acute cholecystitis 08/17/2016  . Acute cholecystitis   . Personal history of tobacco use, presenting hazards to health 07/08/2016   s/p Procedure(s): ENDOSCOPIC RETROGRADE CHOLANGIOPANCREATOGRAPHY (ERCP) 08/18/2016 -lap chole -We discussed the etiology of her pain, we discussed treatment options and recommended surgery. We discussed details of surgery including general anesthesia, laparoscopic approach, identification of cystic duct and common bile duct. Ligation of cystic duct and cystic artery. Possible need for intraoperative cholangiogram or open procedure. Possible risks of common bile duct injury, liver injury, cystic duct leak, bleeding, infection, post-cholecystectomy syndrome. The patient showed good understanding and all questions were answered     LOS: 2 days  Chief Complaint/Subjective: Continued pain, no nausea  Objective: Vital signs in last 24 hours: Temp:  [97.7 F (36.5 C)-98.6 F (37 C)] 98.4 F (36.9 C) (07/22 0516) Pulse Rate:  [60-103] 60 (07/22 0516) Resp:  [18-29] 18 (07/22 0516) BP: (94-118)/(15-65) 116/60 (07/22 0516) SpO2:  [94 %-100 %] 96 % (07/22 0516) Last BM Date: 08/16/16  Intake/Output from previous day: 07/21 0701 - 07/22 0700 In: 2447.5 [I.V.:2297.5; IV Piggyback:150] Out: 0254 [Urine:1350; Blood:5] Intake/Output this shift: No intake/output data recorded.  Lungs: CTAB  Cardiovascular: RRR  Abd: soft, TTP RUQ and epigastrium  Extremities: no edema  Neuro: AOx4  Lab Results: CBC   Recent Labs  08/17/16 1825 08/18/16 0344  WBC 15.1* 11.5*  HGB 10.9* 10.5*  HCT 31.3* 31.1*  PLT 122* 113*   BMET  Recent Labs  08/17/16 0245 08/17/16 1825 08/18/16 0344  NA 135  --  133*  K 3.2*  --  3.6  CL 98*  --  102  CO2 25  --  25  GLUCOSE 186*  --  82  BUN 14  --  13  CREATININE 1.03 1.07  1.07  CALCIUM 9.5  --  7.9*   PT/INR No results for input(s): LABPROT, INR in the last 72 hours. ABG No results for input(s): PHART, HCO3 in the last 72 hours.  Invalid input(s): PCO2, PO2  Studies/Results:  Anti-infectives: Anti-infectives    Start     Dose/Rate Route Frequency Ordered Stop   08/17/16 2300  piperacillin-tazobactam (ZOSYN) IVPB 3.375 g     3.375 g 12.5 mL/hr over 240 Minutes Intravenous Every 8 hours 08/17/16 1648     08/17/16 1800  piperacillin-tazobactam (ZOSYN) IVPB 3.375 g  Status:  Discontinued     3.375 g 100 mL/hr over 30 Minutes Intravenous Every 6 hours 08/17/16 1641 08/17/16 1648      Medications: Scheduled Meds: . enoxaparin (LOVENOX) injection  40 mg Subcutaneous Q24H  . metoprolol succinate  12.5 mg Oral Daily  . nicotine  21 mg Transdermal Daily  . pantoprazole (PROTONIX) IV  40 mg Intravenous Q24H  . tamsulosin  0.4 mg Oral QPC breakfast   Continuous Infusions: . dextrose 5 % and 0.9% NaCl 75 mL/hr at 08/19/16 0602  . piperacillin-tazobactam (ZOSYN)  IV 3.375 g (08/19/16 0600)   PRN Meds:.ALPRAZolam, diphenhydrAMINE, morphine injection, ondansetron (ZOFRAN) IV  Mickeal Skinner, MD Pg# [phone] [facility]

## 2016-08-19 NOTE — H&P (View-Only) (Signed)
  Progress Note: General Surgery Service   Assessment/Plan: Patient Active Problem List   Diagnosis Date Noted  . Bile duct calculus with acute cholecystitis 08/17/2016  . Acute cholecystitis   . Personal history of tobacco use, presenting hazards to health 07/08/2016   s/p Procedure(s): ENDOSCOPIC RETROGRADE CHOLANGIOPANCREATOGRAPHY (ERCP) 08/18/2016 -lap chole -We discussed the etiology of her pain, we discussed treatment options and recommended surgery. We discussed details of surgery including general anesthesia, laparoscopic approach, identification of cystic duct and common bile duct. Ligation of cystic duct and cystic artery. Possible need for intraoperative cholangiogram or open procedure. Possible risks of common bile duct injury, liver injury, cystic duct leak, bleeding, infection, post-cholecystectomy syndrome. The patient showed good understanding and all questions were answered     LOS: 2 days  Chief Complaint/Subjective: Continued pain, no nausea  Objective: Vital signs in last 24 hours: Temp:  [97.7 F (36.5 C)-98.6 F (37 C)] 98.4 F (36.9 C) (07/22 0516) Pulse Rate:  [60-103] 60 (07/22 0516) Resp:  [18-29] 18 (07/22 0516) BP: (94-118)/(15-65) 116/60 (07/22 0516) SpO2:  [94 %-100 %] 96 % (07/22 0516) Last BM Date: 08/16/16  Intake/Output from previous day: 07/21 0701 - 07/22 0700 In: 2447.5 [I.V.:2297.5; IV Piggyback:150] Out: 7001 [Urine:1350; Blood:5] Intake/Output this shift: No intake/output data recorded.  Lungs: CTAB  Cardiovascular: RRR  Abd: soft, TTP RUQ and epigastrium  Extremities: no edema  Neuro: AOx4  Lab Results: CBC   Recent Labs  08/17/16 1825 08/18/16 0344  WBC 15.1* 11.5*  HGB 10.9* 10.5*  HCT 31.3* 31.1*  PLT 122* 113*   BMET  Recent Labs  08/17/16 0245 08/17/16 1825 08/18/16 0344  NA 135  --  133*  K 3.2*  --  3.6  CL 98*  --  102  CO2 25  --  25  GLUCOSE 186*  --  82  BUN 14  --  13  CREATININE 1.03 1.07  1.07  CALCIUM 9.5  --  7.9*   PT/INR No results for input(s): LABPROT, INR in the last 72 hours. ABG No results for input(s): PHART, HCO3 in the last 72 hours.  Invalid input(s): PCO2, PO2  Studies/Results:  Anti-infectives: Anti-infectives    Start     Dose/Rate Route Frequency Ordered Stop   08/17/16 2300  piperacillin-tazobactam (ZOSYN) IVPB 3.375 g     3.375 g 12.5 mL/hr over 240 Minutes Intravenous Every 8 hours 08/17/16 1648     08/17/16 1800  piperacillin-tazobactam (ZOSYN) IVPB 3.375 g  Status:  Discontinued     3.375 g 100 mL/hr over 30 Minutes Intravenous Every 6 hours 08/17/16 1641 08/17/16 1648      Medications: Scheduled Meds: . enoxaparin (LOVENOX) injection  40 mg Subcutaneous Q24H  . metoprolol succinate  12.5 mg Oral Daily  . nicotine  21 mg Transdermal Daily  . pantoprazole (PROTONIX) IV  40 mg Intravenous Q24H  . tamsulosin  0.4 mg Oral QPC breakfast   Continuous Infusions: . dextrose 5 % and 0.9% NaCl 75 mL/hr at 08/19/16 0602  . piperacillin-tazobactam (ZOSYN)  IV 3.375 g (08/19/16 0600)   PRN Meds:.ALPRAZolam, diphenhydrAMINE, morphine injection, ondansetron (ZOFRAN) IV  Mickeal Skinner, MD Pg# [phone] [facility]

## 2016-08-19 NOTE — Anesthesia Procedure Notes (Signed)
Procedure Name: Intubation Date/Time: 08/19/2016 2:29 PM Performed by: Clearnce Sorrel Pre-anesthesia Checklist: Patient identified, Emergency Drugs available, Suction available, Patient being monitored and Timeout performed Patient Re-evaluated:Patient Re-evaluated prior to induction Oxygen Delivery Method: Circle system utilized Preoxygenation: Pre-oxygenation with 100% oxygen Induction Type: IV induction Ventilation: Mask ventilation without difficulty Laryngoscope Size: Mac and 3 Grade View: Grade I Tube type: Oral Number of attempts: 1 Airway Equipment and Method: Stylet Placement Confirmation: ETT inserted through vocal cords under direct vision,  positive ETCO2 and breath sounds checked- equal and bilateral Secured at: 22 cm Tube secured with: Tape Dental Injury: Teeth and Oropharynx as per pre-operative assessment

## 2016-08-19 NOTE — Progress Notes (Signed)
PROGRESS NOTE    Todd Whitehead  JGG:[id number] DOB: January 27, 1946 DOA: 08/17/2016 PCP: [name], MD    Brief Narrative:   71 y.o. male with medical history significant of CABG/COPD who smokes one half pack every day presents to the [facility] with a chief complaint of worsening epigastric abdominal pain nausea vomiting fevers and chills for the last few days WAS FOUND TO HAVE CBD dilation with acute cholecystitis and cholelethiasis .   Assessment & Plan:    1-Acute cholecystitis with CBD dilation: ERCP has done today and failed to show the ampulla of Vater , MRCP showed no more CBD dilation , patter compatible with passing stone , plan for Lap chol today , deesclate the Abx after the surgery .   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  3-history of smoking 1-1/2 pack every day : counseling provided the patient is provided with nicotine patch   DVT prophylaxis: scd. Code Status: (Full) Family Communication: (none ) Disposition Plan: (home)   Consultants:   GI.  SURGERY.  Procedures:    , ERCP  Antimicrobials: (specify start and planned stop date. Auto populated tables are space occupying and do not give end dates)  ZOSYN , DAY#3.   Subjective:  Epigastric pain is better , no Nausea vomiting or diarrhea .  Objective: Vitals:   08/18/16 1500 08/18/16 2156 08/19/16 0516 08/19/16 1133  BP: 118/65 112/61 116/60 123/65  Pulse: 81 79 60 72  Resp: (!) 23 18 18 18   Temp: 98.5 F (36.9 C) 98.6 F (37 C) 98.4 F (36.9 C) 97.9 F (36.6 C)  TempSrc: Oral Oral Oral Oral  SpO2: 96% 97% 96% 94%  Weight:      Height:        Intake/Output Summary (Last 24 hours) at 08/19/16 1340 Last data filed at 08/19/16  1050  Gross per 24 hour  Intake           1447.5 ml  Output             1150 ml  Net            297.5 ml   Filed Weights   08/17/16 1542  Weight: 54.7 kg (120 lb 9.5 oz)    Examination:  General exam: Appears calm and comfortable  Respiratory system: Clear to auscultation. Respiratory effort normal. Cardiovascular system: S1 & S2 heard, RRR. No JVD, murmurs, rubs, gallops or clicks. No pedal edema. Gastrointestinal system: S,NT,ND,BS+. Central nervous system: Alert and oriented. No focal neurological deficits. Extremities: Symmetric 5 x 5 power. Skin: No rashes, lesions or ulcers Psychiatry: Judgement and insight appear normal. Mood & affect appropriate.     Data Reviewed: I have personally reviewed following labs and imaging studies  CBC:  Recent Labs Lab 08/17/16 0245 08/17/16 1825 08/18/16 0344  WBC 10.8* 15.1* 11.5*  NEUTROABS  --   --  10.2*  HGB 13.0 10.9* 10.5*  HCT 37.5* 31.3* 31.1*  MCV 88.5 87.2 87.1  PLT 191 122* 546*   Basic Metabolic Panel:  Recent Labs Lab 08/17/16 0245 08/17/16 1825 08/18/16 0344  NA 135  --  133*  K 3.2*  --  3.6  CL 98*  --  102  CO2 25  --  25  GLUCOSE 186*  --  82  BUN 14  --  13  CREATININE 1.03 1.07 1.07  CALCIUM 9.5  --  7.9*   GFR: Estimated Creatinine Clearance: 49 mL/min (by C-G formula based on SCr of 1.07 mg/dL). Liver Function Tests:  Recent Labs Lab 08/17/16 0245 08/18/16 0344  AST 1,249* 284*  ALT 826* 410*  ALKPHOS 218* 110  BILITOT 2.8* 2.1*  PROT 7.4 5.2*  ALBUMIN 4.3 2.8*    Recent Labs Lab 08/17/16 0245  LIPASE 31   No results for input(s): AMMONIA in the last 168 hours. Coagulation Profile: No results for input(s): INR, PROTIME in the last 168 hours. Cardiac Enzymes: No results for input(s): CKTOTAL, CKMB, CKMBINDEX, TROPONINI in the last 168 hours. BNP (last 3 results) No results for input(s): PROBNP in the last 8760 hours. HbA1C: No results for input(s): HGBA1C in the last 72  hours. CBG: No results for input(s): GLUCAP in the last 168 hours. Lipid Profile: No results for input(s): CHOL, HDL, LDLCALC, TRIG, CHOLHDL, LDLDIRECT in the last 72 hours. Thyroid Function Tests: No results for input(s): TSH, T4TOTAL, FREET4, T3FREE, THYROIDAB in the last 72 hours. Anemia Panel: No results for input(s): VITAMINB12, FOLATE, FERRITIN, TIBC, IRON, RETICCTPCT in the last 72 hours. Urine analysis:    Component Value Date/Time   COLORURINE YELLOW (A) 08/17/2016 0533   APPEARANCEUR CLEAR (A) 08/17/2016 0533   LABSPEC 1.009 08/17/2016 0533   PHURINE 6.0 08/17/2016 0533   GLUCOSEU NEGATIVE 08/17/2016 0533   HGBUR MODERATE (A) 08/17/2016 0533   BILIRUBINUR NEGATIVE 08/17/2016 0533   KETONESUR NEGATIVE 08/17/2016 0533   PROTEINUR 30 (A) 08/17/2016 0533   NITRITE NEGATIVE 08/17/2016 0533   LEUKOCYTESUR NEGATIVE 08/17/2016 0533   Sepsis Labs: @LABRCNTIP (procalcitonin:4,lacticidven:4)  ) Recent Results (from the past 240 hour(s))  Surgical pcr screen     Status: None   Collection Time: 08/19/16  8:55 AM  Result Value Ref Range Status   MRSA, PCR NEGATIVE NEGATIVE Final   Staphylococcus aureus NEGATIVE NEGATIVE Final    Comment:        The Xpert SA Assay (FDA approved for NASAL specimens in patients over 17 years of age), is one component of a comprehensive surveillance program.  Test performance has been validated by [facility] for patients greater than or equal to 85 year old. It is not intended to diagnose infection nor to guide or monitor treatment.          Radiology Studies: Mr Abdomen Mrcp Wo Contrast  Result Date: 08/18/2016 CLINICAL DATA:  Abnormal LFTs with dilated common bile duct. EXAM: MRI ABDOMEN WITHOUT CONTRAST  (INCLUDING MRCP) TECHNIQUE: Multiplanar multisequence MR imaging of the abdomen was performed. Heavily T2-weighted images of the biliary and pancreatic ducts were obtained, and three-dimensional MRCP images were rendered by post  processing. COMPARISON:  CT scan 08/17/2016.  Abdominal ultrasound 08/17/2016. FINDINGS: Lower chest:  Unremarkable. Hepatobiliary: No focal abnormality within the liver parenchyma. Mild intrahepatic biliary duct dilatation on today's exam, decreased since the CT scan yesterday. Multiple tiny gallstones are identified, measuring in the 3-4 mm size range (well demonstrated on image 6 of series 9). There is some central flow artifact in the common bile duct on the SSFSE sequence, but no intraluminal filling defect on any of the other fluid sensitive sequences. Extrahepatic common bile duct in the head of the pancreas measures only 7 mm diameter today which compares to 12 mm when I remeasure it at the same level on yesterday's CT  scan. Pancreas: The main pancreatic duct measures 2-3 mm diameter throughout. No evidence for pancreatic head mass on this noncontrast exam. Spleen: No splenomegaly. No focal mass lesion. Adrenals/Urinary Tract: Mild thickening left adrenal gland. Right adrenal gland unremarkable. Small well-defined homogeneous T2 hyperintensities are seen in both kidneys, likely cysts. No evidence for hydronephrosis. Stomach/Bowel: Stomach is nondistended. No gastric wall thickening. No evidence of outlet obstruction. Duodenum is normally positioned as is the ligament of Treitz. No small bowel or colonic dilatation. Vascular/Lymphatic: No abdominal aortic aneurysm. No bulky abdominal lymphadenopathy. Other: Trace free fluid noted around the liver and spleen. Musculoskeletal: No abnormal marrow signal within the visualized bony anatomy. IMPRESSION: 1. Cholelithiasis without choledocholithiasis. Interval decrease in common bile duct diameter, measuring 6 mm today compared to 12 mm at the same level when I remeasure on yesterday's study. Intrahepatic biliary duct dilatation appears decreased in the interval. 2. Mild diffuse prominence main pancreatic duct. No evidence for pancreatic head mass on this noncontrast  exam. 3. Probable small bilateral renal cysts. Electronically Signed   By: Misty Stanley M.D.   On: 08/18/2016 16:10   Dg Ercp  Result Date: 08/18/2016 CLINICAL DATA:  71 year old male with acute cholecystitis and biliary ductal dilatation concerning for occult choledocholithiasis EXAM: ERCP TECHNIQUE: Multiple spot images obtained with the fluoroscopic device and submitted for interpretation post-procedure. FLUOROSCOPY TIME:  Fluoroscopy Time:  0 minutes 20 seconds COMPARISON:  Abdominal ultrasound 08/17/2016; CT scan of the abdomen and pelvis 08/17/2016 FINDINGS: A single saved image demonstrates a flexible endoscope in the descending duodenum. The major papilla was not identified, and the common bile duct was not cannulated. IMPRESSION: Limited ERCP as above. These images were submitted for radiologic interpretation only. Please see the procedural report for the amount of contrast and the fluoroscopy time utilized. Electronically Signed   By: Jacqulynn Cadet M.D.   On: 08/18/2016 10:51        Scheduled Meds: . [MAR Hold] enoxaparin (LOVENOX) injection  40 mg Subcutaneous Q24H  . [MAR Hold] metoprolol succinate  12.5 mg Oral Daily  . [MAR Hold] nicotine  21 mg Transdermal Daily  . [MAR Hold] pantoprazole (PROTONIX) IV  40 mg Intravenous Q24H  . [MAR Hold] tamsulosin  0.4 mg Oral QPC breakfast   Continuous Infusions: . dextrose 5 % and 0.9% NaCl 75 mL/hr at 08/19/16 0602  . [MAR Hold] piperacillin-tazobactam (ZOSYN)  IV Stopped (08/19/16 1000)     LOS: 2 days    Time spent: 13 M    Waldron Session, MD [facility] Pager [phone]  If 7PM-7AM, please contact night-coverage [email or web address] Password [email or web address] 08/19/2016, 1:40 PM

## 2016-08-19 NOTE — Interval H&P Note (Signed)
History and Physical Interval Note:  08/19/2016 2:04 PM  Todd Whitehead  has presented today for surgery, with the diagnosis of cholecystitis  The various methods of treatment have been discussed with the patient and family. After consideration of risks, benefits and other options for treatment, the patient has consented to  Procedure(s): LAPAROSCOPIC CHOLECYSTECTOMY WITH INTRAOPERATIVE CHOLANGIOGRAM (N/A) as a surgical intervention .  The patient's history has been reviewed, patient examined, no change in status, stable for surgery.  I have reviewed the patient's chart and labs.  Questions were answered to the patient's satisfaction.     [name]

## 2016-08-19 NOTE — Anesthesia Preprocedure Evaluation (Addendum)
Anesthesia Evaluation  Patient identified by MRN, date of birth, ID band Patient awake    Reviewed: Allergy & Precautions, NPO status , Patient's Chart, lab work & pertinent test results  Airway Mallampati: II  TM Distance: >3 FB     Dental   Pulmonary COPD, Current Smoker,    breath sounds clear to auscultation       Cardiovascular hypertension, + dysrhythmias  Rhythm:Regular Rate:Normal     Neuro/Psych    GI/Hepatic negative GI ROS, Neg liver ROS,   Endo/Other  negative endocrine ROS  Renal/GU negative Renal ROS     Musculoskeletal  (+) Arthritis ,   Abdominal   Peds  Hematology   Anesthesia Other Findings   Reproductive/Obstetrics                             Anesthesia Physical Anesthesia Plan  ASA: III  Anesthesia Plan: General   Post-op Pain Management:    Induction: Intravenous  PONV Risk Score and Plan: 2 and Ondansetron, Propofol, Midazolam and Treatment may vary due to age or medical condition  Airway Management Planned: Oral ETT  Additional Equipment:   Intra-op Plan:   Post-operative Plan: Possible Post-op intubation/ventilation  Informed Consent: I have reviewed the patients History and Physical, chart, labs and discussed the procedure including the risks, benefits and alternatives for the proposed anesthesia with the patient or authorized representative who has indicated his/her understanding and acceptance.   Dental advisory given  Plan Discussed with: CRNA and Anesthesiologist  Anesthesia Plan Comments:        Anesthesia Quick Evaluation

## 2016-08-19 NOTE — Transfer of Care (Signed)
Immediate Anesthesia Transfer of Care Note  Patient: Todd Whitehead  Procedure(s) Performed: Procedure(s): LAPAROSCOPIC CHOLECYSTECTOMY WITH INTRAOPERATIVE CHOLANGIOGRAM (N/A)  Patient Location: PACU  Anesthesia Type:General  Level of Consciousness: awake, alert  and oriented  Airway & Oxygen Therapy: Patient Spontanous Breathing and Patient connected to nasal cannula oxygen  Post-op Assessment: Report given to RN and Post -op Vital signs reviewed and stable  Post vital signs: Reviewed and stable  Last Vitals:  Vitals:   08/19/16 0516 08/19/16 1133  BP: 116/60 123/65  Pulse: 60 72  Resp: 18 18  Temp: 36.9 C 36.6 C    Last Pain:  Vitals:   08/19/16 1318  TempSrc:   PainSc: 6       Patients Stated Pain Goal: 4 (81/18/86 7737)  Complications: No apparent anesthesia complications

## 2016-08-19 NOTE — Anesthesia Postprocedure Evaluation (Signed)
Anesthesia Post Note  Patient: Todd Whitehead  Procedure(s) Performed: Procedure(s) (LRB): LAPAROSCOPIC CHOLECYSTECTOMY WITH INTRAOPERATIVE CHOLANGIOGRAM (N/A)     Patient location during evaluation: PACU Anesthesia Type: General Level of consciousness: awake Pain management: pain level controlled Vital Signs Assessment: post-procedure vital signs reviewed and stable Respiratory status: spontaneous breathing Cardiovascular status: stable    Last Vitals:  Vitals:   08/19/16 1645 08/19/16 1646  BP:  (!) 161/81  Pulse:  78  Resp:  20  Temp: 36.5 C     Last Pain:  Vitals:   08/19/16 1640  TempSrc:   PainSc: 10-Worst pain ever                 Tarika Mckethan

## 2016-08-19 NOTE — Progress Notes (Signed)
Subjective: The patient was seen and examined at bedside. He continues to have pain in the epigastric and right upper quadrant. Denies nausea vomiting currently. Denies fever chills or rigors.  Objective: Vital signs in last 24 hours: Temp:  [98.3 F (36.8 C)-98.6 F (37 C)] 98.4 F (36.9 C) (07/22 0516) Pulse Rate:  [60-92] 60 (07/22 0516) Resp:  [18-24] 18 (07/22 0516) BP: (97-118)/(15-65) 116/60 (07/22 0516) SpO2:  [95 %-97 %] 96 % (07/22 0516) Weight change:  Last BM Date: 08/16/16  PE: Not in acute distress GENERAL: No obvious icterus, mild pallor ABDOMEN: Epigastric and right upper quadrant tenderness, bowel sounds normoactive EXTREMITIES: No edema  Lab Results: Results for orders placed or performed during the hospital encounter of 08/17/16 (from the past 48 hour(s))  CBC     Status: Abnormal   Collection Time: 08/17/16  6:25 PM  Result Value Ref Range   WBC 15.1 (H) 4.0 - 10.5 K/uL   RBC 3.59 (L) 4.22 - 5.81 MIL/uL   Hemoglobin 10.9 (L) 13.0 - 17.0 g/dL   HCT 31.3 (L) 39.0 - 52.0 %   MCV 87.2 78.0 - 100.0 fL   MCH 30.4 26.0 - 34.0 pg   MCHC 34.8 30.0 - 36.0 g/dL   RDW 14.0 11.5 - 15.5 %   Platelets 122 (L) 150 - 400 K/uL  Creatinine, serum     Status: None   Collection Time: 08/17/16  6:25 PM  Result Value Ref Range   Creatinine, Ser 1.07 0.61 - 1.24 mg/dL   GFR calc non Af Amer >60 >60 mL/min   GFR calc Af Amer >60 >60 mL/min    Comment: (NOTE) The eGFR has been calculated using the CKD EPI equation. This calculation has not been validated in all clinical situations. eGFR's persistently <60 mL/min signify possible Chronic Kidney Disease.   CBC with Differential/Platelet     Status: Abnormal   Collection Time: 08/18/16  3:44 AM  Result Value Ref Range   WBC 11.5 (H) 4.0 - 10.5 K/uL   RBC 3.57 (L) 4.22 - 5.81 MIL/uL   Hemoglobin 10.5 (L) 13.0 - 17.0 g/dL   HCT 31.1 (L) 39.0 - 52.0 %   MCV 87.1 78.0 - 100.0 fL   MCH 29.4 26.0 - 34.0 pg   MCHC 33.8 30.0 -  36.0 g/dL   RDW 14.2 11.5 - 15.5 %   Platelets 113 (L) 150 - 400 K/uL   Neutrophils Relative % 89 %   Lymphocytes Relative 7 %   Monocytes Relative 4 %   Eosinophils Relative 0 %   Basophils Relative 0 %   Neutro Abs 10.2 (H) 1.7 - 7.7 K/uL   Lymphs Abs 0.8 0.7 - 4.0 K/uL   Monocytes Absolute 0.5 0.1 - 1.0 K/uL   Eosinophils Absolute 0.0 0.0 - 0.7 K/uL   Basophils Absolute 0.0 0.0 - 0.1 K/uL   RBC Morphology ELLIPTOCYTES   Comprehensive metabolic panel     Status: Abnormal   Collection Time: 08/18/16  3:44 AM  Result Value Ref Range   Sodium 133 (L) 135 - 145 mmol/L   Potassium 3.6 3.5 - 5.1 mmol/L   Chloride 102 101 - 111 mmol/L   CO2 25 22 - 32 mmol/L   Glucose, Bld 82 65 - 99 mg/dL   BUN 13 6 - 20 mg/dL   Creatinine, Ser 1.07 0.61 - 1.24 mg/dL   Calcium 7.9 (L) 8.9 - 10.3 mg/dL   Total Protein 5.2 (L) 6.5 - 8.1 g/dL  Albumin 2.8 (L) 3.5 - 5.0 g/dL   AST 284 (H) 15 - 41 U/L   ALT 410 (H) 17 - 63 U/L   Alkaline Phosphatase 110 38 - 126 U/L   Total Bilirubin 2.1 (H) 0.3 - 1.2 mg/dL   GFR calc non Af Amer >60 >60 mL/min   GFR calc Af Amer >60 >60 mL/min    Comment: (NOTE) The eGFR has been calculated using the CKD EPI equation. This calculation has not been validated in all clinical situations. eGFR's persistently <60 mL/min signify possible Chronic Kidney Disease.    Anion gap 6 5 - 15    Studies/Results: Mr Abdomen Mrcp Wo Contrast  Result Date: 08/18/2016 CLINICAL DATA:  Abnormal LFTs with dilated common bile duct. EXAM: MRI ABDOMEN WITHOUT CONTRAST  (INCLUDING MRCP) TECHNIQUE: Multiplanar multisequence MR imaging of the abdomen was performed. Heavily T2-weighted images of the biliary and pancreatic ducts were obtained, and three-dimensional MRCP images were rendered by post processing. COMPARISON:  CT scan 08/17/2016.  Abdominal ultrasound 08/17/2016. FINDINGS: Lower chest:  Unremarkable. Hepatobiliary: No focal abnormality within the liver parenchyma. Mild  intrahepatic biliary duct dilatation on today's exam, decreased since the CT scan yesterday. Multiple tiny gallstones are identified, measuring in the 3-4 mm size range (well demonstrated on image 6 of series 9). There is some central flow artifact in the common bile duct on the SSFSE sequence, but no intraluminal filling defect on any of the other fluid sensitive sequences. Extrahepatic common bile duct in the head of the pancreas measures only 7 mm diameter today which compares to 12 mm when I remeasure it at the same level on yesterday's CT scan. Pancreas: The main pancreatic duct measures 2-3 mm diameter throughout. No evidence for pancreatic head mass on this noncontrast exam. Spleen: No splenomegaly. No focal mass lesion. Adrenals/Urinary Tract: Mild thickening left adrenal gland. Right adrenal gland unremarkable. Small well-defined homogeneous T2 hyperintensities are seen in both kidneys, likely cysts. No evidence for hydronephrosis. Stomach/Bowel: Stomach is nondistended. No gastric wall thickening. No evidence of outlet obstruction. Duodenum is normally positioned as is the ligament of Treitz. No small bowel or colonic dilatation. Vascular/Lymphatic: No abdominal aortic aneurysm. No bulky abdominal lymphadenopathy. Other: Trace free fluid noted around the liver and spleen. Musculoskeletal: No abnormal marrow signal within the visualized bony anatomy. IMPRESSION: 1. Cholelithiasis without choledocholithiasis. Interval decrease in common bile duct diameter, measuring 6 mm today compared to 12 mm at the same level when I remeasure on yesterday's study. Intrahepatic biliary duct dilatation appears decreased in the interval. 2. Mild diffuse prominence main pancreatic duct. No evidence for pancreatic head mass on this noncontrast exam. 3. Probable small bilateral renal cysts. Electronically Signed   By: Misty Stanley M.D.   On: 08/18/2016 16:10   Dg Ercp  Result Date: 08/18/2016 CLINICAL DATA:  71 year old  male with acute cholecystitis and biliary ductal dilatation concerning for occult choledocholithiasis EXAM: ERCP TECHNIQUE: Multiple spot images obtained with the fluoroscopic device and submitted for interpretation post-procedure. FLUOROSCOPY TIME:  Fluoroscopy Time:  0 minutes 20 seconds COMPARISON:  Abdominal ultrasound 08/17/2016; CT scan of the abdomen and pelvis 08/17/2016 FINDINGS: A single saved image demonstrates a flexible endoscope in the descending duodenum. The major papilla was not identified, and the common bile duct was not cannulated. IMPRESSION: Limited ERCP as above. These images were submitted for radiologic interpretation only. Please see the procedural report for the amount of contrast and the fluoroscopy time utilized. Electronically Signed   By: Jacqulynn Cadet  M.D.   On: 08/18/2016 10:51    Medications: I have reviewed the patient's current medications.  Assessment: 1. MRCP showed multiple tiny gallstones 3-4 mm in size 2. No intraluminal filling defect in the CBD 3. Extrahepatic CBD measures only 7 mm(compared to 12 mm from prior CAT scan) Cholelithiasis without choledocholithiasis. Intrahepatic biliary ductal dilatation appears decreased in interval.  Plan: 1. Abnormal LFTs, trending down, CBD size decreasing? Possibility of a passed CBD stone 2. As MRCP did not show choledocholithiasis, patient is scheduled today for a laparoscopic cholecystectomy for gallstones and acute cholecystitis.  Will sign off from GI standpoint. Please feel free to recall if needed.    08/19/2016, 11:19 AM   Pager [phone] If no answer or after 5 PM call [phone] 

## 2016-08-20 ENCOUNTER — Encounter (HOSPITAL_COMMUNITY): Payer: Self-pay | Admitting: General Surgery

## 2016-08-20 DIAGNOSIS — Z419 Encounter for procedure for purposes other than remedying health state, unspecified: Secondary | ICD-10-CM

## 2016-08-20 LAB — COMPREHENSIVE METABOLIC PANEL
ALK PHOS: 84 U/L (ref 38–126)
ALT: 167 U/L — AB (ref 17–63)
AST: 60 U/L — ABNORMAL HIGH (ref 15–41)
Albumin: 2.7 g/dL — ABNORMAL LOW (ref 3.5–5.0)
Anion gap: 4 — ABNORMAL LOW (ref 5–15)
BILIRUBIN TOTAL: 0.6 mg/dL (ref 0.3–1.2)
BUN: 8 mg/dL (ref 6–20)
CALCIUM: 8.1 mg/dL — AB (ref 8.9–10.3)
CO2: 26 mmol/L (ref 22–32)
CREATININE: 0.87 mg/dL (ref 0.61–1.24)
Chloride: 105 mmol/L (ref 101–111)
Glucose, Bld: 127 mg/dL — ABNORMAL HIGH (ref 65–99)
Potassium: 3.3 mmol/L — ABNORMAL LOW (ref 3.5–5.1)
Sodium: 135 mmol/L (ref 135–145)
Total Protein: 5.1 g/dL — ABNORMAL LOW (ref 6.5–8.1)

## 2016-08-20 LAB — CBC
HEMATOCRIT: 31.2 % — AB (ref 39.0–52.0)
HEMOGLOBIN: 10.6 g/dL — AB (ref 13.0–17.0)
MCH: 29.2 pg (ref 26.0–34.0)
MCHC: 34 g/dL (ref 30.0–36.0)
MCV: 86 fL (ref 78.0–100.0)
Platelets: 111 10*3/uL — ABNORMAL LOW (ref 150–400)
RBC: 3.63 MIL/uL — AB (ref 4.22–5.81)
RDW: 13.9 % (ref 11.5–15.5)
WBC: 8.4 10*3/uL (ref 4.0–10.5)

## 2016-08-20 MED ORDER — TAMSULOSIN HCL 0.4 MG PO CAPS: 0.4000 mg | ORAL_CAPSULE | Freq: Every day | ORAL | 2 refills | Status: AC

## 2016-08-20 MED ORDER — OXYCODONE HCL 5 MG PO TABS
5.0000 mg | ORAL_TABLET | ORAL | Status: DC | PRN
  Administered 2016-08-20: 10 mg via ORAL
  Filled 2016-08-20: qty 2

## 2016-08-20 MED ORDER — PANTOPRAZOLE SODIUM 40 MG PO TBEC: 40.0000 mg | DELAYED_RELEASE_TABLET | Freq: Every day | ORAL | Status: DC

## 2016-08-20 MED ORDER — ASPIRIN 81 MG PO CHEW: 81.0000 mg | CHEWABLE_TABLET | Freq: Every day | ORAL | 11 refills | Status: AC

## 2016-08-20 MED ORDER — ACETAMINOPHEN 500 MG PO TABS
1000.0000 mg | ORAL_TABLET | Freq: Three times a day (TID) | ORAL | Status: DC
  Administered 2016-08-20: 1000 mg via ORAL
  Filled 2016-08-20: qty 2

## 2016-08-20 MED ORDER — TAMSULOSIN HCL 0.4 MG PO CAPS
0.8000 mg | ORAL_CAPSULE | Freq: Once | ORAL | Status: AC
  Administered 2016-08-20: 0.8 mg via ORAL
  Filled 2016-08-20: qty 2

## 2016-08-20 MED ORDER — ACETAMINOPHEN 500 MG PO TABS: 500.0000 mg | ORAL_TABLET | Freq: Four times a day (QID) | ORAL | 0 refills | Status: AC | PRN

## 2016-08-20 MED ORDER — NICOTINE 21 MG/24HR TD PT24: 21.0000 mg | MEDICATED_PATCH | Freq: Every day | TRANSDERMAL | 0 refills | Status: DC

## 2016-08-20 NOTE — Progress Notes (Signed)
1 Day Post-Op    CC: abdominal pain, nausea and vomiting  Subjective: Sitting up and eating.  Complains of swelling in the penis with the catheter in.  Fore skin was retracted.  Abdominal pain is better, sites all look good.    Objective: Vital signs in last 24 hours: Temp:  [97.4 F (36.3 C)-98.6 F (37 C)] 98.4 F (36.9 C) (07/23 0440) Pulse Rate:  [64-79] 70 (07/23 0440) Resp:  [18-27] 18 (07/23 0440) BP: (117-161)/(55-86) 161/70 (07/23 0440) SpO2:  [94 %-100 %] 98 % (07/23 0440) Last BM Date: 08/16/16 Nothing PO recorded 1356 IV 1450 urine Afebrile, VSS CBC is stable, WBC is down CMP shows LFT's improving IOC - negative    Intake/Output from previous day: 07/22 0701 - 07/23 0700 In: 1356.3 [I.V.:1056.3; IV Piggyback:300] Out: 1475 [Urine:1450; Blood:25] Intake/Output this shift: No intake/output data recorded.  General appearance: alert, cooperative, no distress and anxious about penile swelling.  Resp: clear to auscultation bilaterally GI: soft, sore, sites all look fine. Tolerating soft diet this AM some mild distal penile swelling with foley in place.  Fore skin retracted.  I corrected retraction of the fore skin and will d/c foley.   Lab Results:   Recent Labs  08/18/16 0344 08/20/16 0559  WBC 11.5* 8.4  HGB 10.5* 10.6*  HCT 31.1* 31.2*  PLT 113* 111*    BMET  Recent Labs  08/18/16 0344 08/20/16 0559  NA 133* 135  K 3.6 3.3*  CL 102 105  CO2 25 26  GLUCOSE 82 127*  BUN 13 8  CREATININE 1.07 0.87  CALCIUM 7.9* 8.1*   PT/INR No results for input(s): LABPROT, INR in the last 72 hours.   Recent Labs Lab 08/17/16 0245 08/18/16 0344 08/20/16 0559  AST 1,249* 284* 60*  ALT 826* 410* 167*  ALKPHOS 218* 110 84  BILITOT 2.8* 2.1* 0.6  PROT 7.4 5.2* 5.1*  ALBUMIN 4.3 2.8* 2.7*     Lipase     Component Value Date/Time   LIPASE 31 08/17/2016 0245     Prior to Admission medications   Medication Sig Start Date End Date Taking?  Authorizing Provider  ALPRAZolam Duanne Moron) 1 MG tablet Take 1 mg by mouth 3 (three) times daily as needed for anxiety.   Yes [provider]  metoprolol succinate (TOPROL XL) 25 MG 24 hr tablet Take 12.5 mg by mouth daily.   Yes [provider]  tamsulosin (FLOMAX) 0.4 MG CAPS capsule Take 1 capsule (0.4 mg total) by mouth daily after breakfast. 10/03/12  Yes Jovita Gamma, MD    Medications: . enoxaparin (LOVENOX) injection  40 mg Subcutaneous Q24H  . metoprolol succinate  12.5 mg Oral Daily  . nicotine  21 mg Transdermal Daily  . pantoprazole (PROTONIX) IV  40 mg Intravenous Q24H  . pneumococcal 23 valent vaccine  0.5 mL Intramuscular Tomorrow-1000  . tamsulosin  0.4 mg Oral QPC breakfast   . dextrose 5 % and 0.9% NaCl 75 mL/hr at 08/19/16 2331  . piperacillin-tazobactam (ZOSYN)  IV 3.375 g (08/20/16 0630)   Anti-infectives    Start     Dose/Rate Route Frequency Ordered Stop   08/17/16 2300  piperacillin-tazobactam (ZOSYN) IVPB 3.375 g     3.375 g 12.5 mL/hr over 240 Minutes Intravenous Every 8 hours 08/17/16 1648     08/17/16 1800  piperacillin-tazobactam (ZOSYN) IVPB 3.375 g  Status:  Discontinued     3.375 g 100 mL/hr over 30 Minutes Intravenous Every 6 hours 08/17/16  1641 08/17/16 1648      Assessment/Plan Acute calculous cholecystitis.   S/p Laparoscopic Cholecystectomy with IOC, 08/19/16, Dr. [name] Biliary dilatation on CT scan/Attempted ERCP 08/18/16 - DR. [name] - The major papilla was not found. Hx of CAD Hx of tobacco ;use  1.5 PPD - ongoing use Hypertension Hx of Melanoma LUE FEN:  IV fluids/Soft diet ID:  Zosyn 08/16/16, =>> day 4 DVT:  Lovenox  Plan:  DC foley, and Abx, mobilize and see how he does.  If he can void without issue and tolerate discomfort with PO pain meds he can go home later today from our standpoint.  I will put follow up in the AVS. WE can recheck LFT's as OP prior to returning to our office.  Resume home  PO meds per  Medicine today also. He lives alone so I ask PT to see and be sure he is safe on his own.    LOS: 3 days    Calvina Liptak 08/20/2016 [phone]

## 2016-08-20 NOTE — Evaluation (Signed)
Physical Therapy Evaluation Patient Details Name: Todd Whitehead MRN: [id number] DOB: 11-Sep-1945 Today's Date: 08/20/2016   History of Present Illness  Pt is a 71 y/o male who presents with acute calculous cholecystitis. He is now s/p laparoscopic cholecystectomy on 08/19/16. PMH significant for melanoma, HTN, COPD, anxiety.  Clinical Impression  Pt admitted with above diagnosis. Pt currently with functional limitations due to the deficits listed below (see PT Problem List). At the time of PT eval pt was able to perform transfers and ambulation with gross min guard assist to supervision for safety. Abdominal pain was the main limiting factor throughout session. Gait appeared somewhat antalgic likely due to pain in penis as well. Pt will benefit from skilled PT to increase their independence and safety with mobility to allow discharge to the venue listed below.       Follow Up Recommendations No PT follow up;Supervision for mobility/OOB    Equipment Recommendations  Cane;3in1 (PT) (pt declining DME - states ex-wife can get for him later if needed)   Recommendations for Other Services       Precautions / Restrictions Precautions Precautions: Fall Restrictions Weight Bearing Restrictions: No      Mobility  Bed Mobility               General bed mobility comments: Pt received sitting up in recliner chair.   Transfers Overall transfer level: Needs assistance Equipment used: None Transfers: Sit to/from Stand Sit to Stand: Min guard         General transfer comment: Hands-on guarding for safety as pt powered-up to full standing position. Appeared unsteady (likely due to pain), however was able to recover without assistance.   Ambulation/Gait Ambulation/Gait assistance: Min guard;Supervision Ambulation Distance (Feet): 200 Feet Assistive device: None Gait Pattern/deviations: Step-through pattern;Decreased stride length;Trunk flexed;Narrow base of support Gait velocity:  Decreased Gait velocity interpretation: Below normal speed for age/gender General Gait Details: Slow and guarded likely due to pain. NT gave pt a post-op pillow during gait traininig which appeared to improve his overall posture and stability. Initially pt required hands-on guarding for safety and improved to overall supervision for safety.   Stairs         General stair comments: Pt declined stair training. Encouraged pt to try but states he will have no problem entering home.   Wheelchair Mobility    Modified Rankin (Stroke Patients Only)       Balance Overall balance assessment: Needs assistance Sitting-balance support: Feet supported;No upper extremity supported Sitting balance-Leahy Scale: Good Sitting balance - Comments: Slightly decreased by pain   Standing balance support: No upper extremity supported;During functional activity Standing balance-Leahy Scale: Fair Standing balance comment: Occasionally appeared unsteady with dynamic standing activity                             Pertinent Vitals/Pain Pain Assessment: Faces Faces Pain Scale: Hurts even more Pain Location: Abdomen, especially with coughing, and penis (swelling from catherter placement) Pain Descriptors / Indicators: Operative site guarding Pain Intervention(s): Limited activity within patient's tolerance;Monitored during session;Repositioned    Home Living Family/patient expects to be discharged to:: Private residence Living Arrangements: Alone Available Help at Discharge: Family;Available 24 hours/day Type of Home: House Home Access: Stairs to enter Entrance Stairs-Rails: Left Entrance Stairs-Number of Steps: 4 going down before you get to 2 step to go up on the porch Home Layout: One level Home Equipment: None      Prior  Function Level of Independence: Independent               Hand Dominance   Dominant Hand: Right    Extremity/Trunk Assessment   Upper Extremity  Assessment Upper Extremity Assessment: Overall WFL for tasks assessed    Lower Extremity Assessment Lower Extremity Assessment: Generalized weakness (Noted generally flexed knees and trunk with upright posture)    Cervical / Trunk Assessment Cervical / Trunk Assessment: Kyphotic;Other exceptions Cervical / Trunk Exceptions: forward head/rounded shoulder posture  Communication   Communication: No difficulties  Cognition Arousal/Alertness: Awake/alert Behavior During Therapy: WFL for tasks assessed/performed;Anxious Overall Cognitive Status: Within Functional Limits for tasks assessed                                        General Comments      Exercises     Assessment/Plan    PT Assessment Patient needs continued PT services  PT Problem List Decreased strength;Decreased range of motion;Decreased activity tolerance;Decreased balance;Decreased mobility;Decreased knowledge of use of DME;Decreased safety awareness;Decreased knowledge of precautions;Pain       PT Treatment Interventions DME instruction;Gait training;Stair training;Functional mobility training;Therapeutic activities;Therapeutic exercise;Neuromuscular re-education;Patient/family education    PT Goals (Current goals can be found in the Care Plan section)  Acute Rehab PT Goals Patient Stated Goal: Return home today PT Goal Formulation: With patient Time For Goal Achievement: 08/27/16 Potential to Achieve Goals: Good    Frequency Min 3X/week   Barriers to discharge        Co-evaluation               AM-PAC PT "6 Clicks" Daily Activity  Outcome Measure Difficulty turning over in bed (including adjusting bedclothes, sheets and blankets)?: None Difficulty moving from lying on back to sitting on the side of the bed? : A Little Difficulty sitting down on and standing up from a chair with arms (e.g., wheelchair, bedside commode, etc,.)?: A Little Help needed moving to and from a bed to chair  (including a wheelchair)?: A Little Help needed walking in hospital room?: A Little Help needed climbing 3-5 steps with a railing? : A Little 6 Click Score: 19    End of Session Equipment Utilized During Treatment: Gait belt Activity Tolerance: Patient tolerated treatment well Patient left: with call bell/phone within reach;in chair Nurse Communication: Mobility status PT Visit Diagnosis: Unsteadiness on feet (R26.81);Pain;Difficulty in walking, not elsewhere classified (R26.2) Pain - part of body:  (abdomen)    Time: 7591-6384 PT Time Calculation (min) (ACUTE ONLY): 14 min   Charges:   PT Evaluation $PT Eval Moderate Complexity: 1 Procedure     PT G Codes:        Rolinda Roan, PT, DPT Acute Rehabilitation Services Pager: [phone]   Thelma Comp 08/20/2016, 12:14 PM

## 2016-08-20 NOTE — Progress Notes (Signed)
Dr. Burnis Medin notified of bladder scan. No new order at this time.

## 2016-08-20 NOTE — Discharge Instructions (Signed)
[facility] ______[facility] °LAPAROSCOPIC SURGERY: POST OP INSTRUCTIONS °Always review your discharge instruction sheet given to you by the facility where your surgery was performed. °IF YOU HAVE DISABILITY OR FAMILY LEAVE FORMS, YOU MUST BRING THEM TO THE OFFICE FOR PROCESSING.   °DO NOT GIVE THEM TO YOUR DOCTOR. ° °1. A prescription for pain medication may be given to you upon discharge.  Take your pain medication as prescribed, if needed.  If narcotic pain medicine is not needed, then you may take acetaminophen (Tylenol) or ibuprofen (Advil) as needed. °2. Take your usually prescribed medications unless otherwise directed. °3. If you need a refill on your pain medication, please contact your pharmacy.  They will contact our office to request authorization. Prescriptions will not be filled after 5pm or on week-ends. °4. You should follow a light diet the first few days after arrival home, such as soup and crackers, etc.  Be sure to include lots of fluids daily. °5. Most patients will experience some swelling and bruising in the area of the incisions.  Ice packs will help.  Swelling and bruising can take several days to resolve.  °6. It is common to experience some constipation if taking pain medication after surgery.  Increasing fluid intake and taking a stool softener (such as Colace) will usually help or prevent this problem from occurring.  A mild laxative (Milk of Magnesia or Miralax) should be taken according to package instructions if there are no bowel movements after 48 hours. °7. Unless discharge instructions indicate otherwise, you may remove your bandages 24-48 hours after surgery, and you may shower at that time.  You may have steri-strips (small skin tapes) in place directly over the incision.  These strips should be left on the skin for 7-10 days.  If your surgeon used skin glue on the incision, you may shower in 24 hours.  The glue will flake off over the next 2-3 weeks.  Any sutures or  staples will be removed at the office during your follow-up visit. °8. ACTIVITIES:  You may resume regular (light) daily activities beginning the next day--such as daily self-care, walking, climbing stairs--gradually increasing activities as tolerated.  You may have sexual intercourse when it is comfortable.  Refrain from any heavy lifting or straining until approved by your doctor. °a. You may drive when you are no longer taking prescription pain medication, you can comfortably wear a seatbelt, and you can safely maneuver your car and apply brakes. °b. RETURN TO WORK:  __________________________________________________________ °9. You should see your doctor in the office for a follow-up appointment approximately 2-3 weeks after your surgery.  Make sure that you call for this appointment within a day or two after you arrive home to insure a convenient appointment time. °10. OTHER INSTRUCTIONS: __________________________________________________________________________________________________________________________ __________________________________________________________________________________________________________________________ °WHEN TO CALL YOUR DOCTOR: °1. Fever over 101.0 °2. Inability to urinate °3. Continued bleeding from incision. °4. Increased pain, redness, or drainage from the incision. °5. Increasing abdominal pain ° °The clinic staff is available to answer your questions during regular business hours.  Please don’t hesitate to call and ask to speak to one of the nurses for clinical concerns.  If you have a medical emergency, go to the nearest emergency room or call 911.  A surgeon from [facility] is always on call at the hospital. °[address], [address], [place], [place]  [address] ? [address] [address], [place], [place]   [address] °[phone] ? [phone] ? FAX [phone] °Web site:   [email or web address] ° ° °Laparoscopic Cholecystectomy, Care After °This sheet  gives you information about how to care for yourself after your procedure. Your health care provider may also give you more specific instructions. If you have problems or questions, contact your health care provider. °What can I expect after the procedure? °After the procedure, it is common to have: °· Pain at your incision sites. You will be given medicines to control this pain. °· Mild nausea or vomiting. °· Bloating and possible shoulder pain from the air-like gas that was used during the procedure. ° °Follow these instructions at home: °Incision care ° °· Follow instructions from your health care provider about how to take care of your incisions. Make sure you: °? Wash your hands with soap and water before you change your bandage (dressing). If soap and water are not available, use hand sanitizer. °? Change your dressing as told by your health care provider. °? Leave stitches (sutures), skin glue, or adhesive strips in place. These skin closures may need to be in place for 2 weeks or longer. If adhesive strip edges start to loosen and curl up, you may trim the loose edges. Do not remove adhesive strips completely unless your health care provider tells you to do that. °· Do not take baths, swim, or use a hot tub until your health care provider approves. Ask your health care provider if you can take showers. You may only be allowed to take sponge baths for bathing. °· Check your incision area every day for signs of infection. Check for: °? More redness, swelling, or pain. °? More fluid or blood. °? Warmth. °? Pus or a bad smell. °Activity °· Do not drive or use heavy machinery while taking prescription pain medicine. °· Do not lift anything that is heavier than 10 lb (4.5 kg) until your health care provider approves. °· Do not play contact sports until your health care provider approves. °· Do not drive for 24 hours if you were given a medicine to help you relax (sedative). °· Rest as needed. Do not return to work  or school until your health care provider approves. °General instructions °· Take over-the-counter and prescription medicines only as told by your health care provider. °· To prevent or treat constipation while you are taking prescription pain medicine, your health care provider may recommend that you: °? Drink enough fluid to keep your urine clear or pale yellow. °? Take over-the-counter or prescription medicines. °? Eat foods that are high in fiber, such as fresh fruits and vegetables, whole grains, and beans. °? Limit foods that are high in fat and processed sugars, such as fried and sweet foods. °Contact a health care provider if: °· You develop a rash. °· You have more redness, swelling, or pain around your incisions. °· You have more fluid or blood coming from your incisions. °· Your incisions feel warm to the touch. °· You have pus or a bad smell coming from your incisions. °· You have a fever. °· One or more of your incisions breaks open. °Get help right away if: °· You have trouble breathing. °· You have chest pain. °· You have increasing pain in your shoulders. °· You faint or feel dizzy when you stand. °· You have severe pain in your abdomen. °· You have nausea or vomiting that lasts for more than one day. °· You have leg pain. °This information is not intended to replace advice given to you by your health care provider. Make sure you   discuss any questions you have with your health care provider. °Document Released: 01/15/2005 Document Revised: 08/06/2015 Document Reviewed: 07/04/2015 °Elsevier Interactive Patient Education © 2017 [employer] ° °

## 2016-08-20 NOTE — Care Management Note (Signed)
Case Management Note  Patient Details  Name: Todd Whitehead MRN: [id number] Date of Birth: 1945-08-16  Subjective/Objective: Pt is a 71 y/o male who presents with acute calculous cholecystitis. He is now s/p laparoscopic cholecystectomy on 08/19/16.  PTA, pt independent of ADLS; lives alone.                   Action/Plan: PT recommending no OP follow up.  Family able to assist at dc as needed.  Pt declines recommended DME, stating that he has access to equipment, if needed.    Expected Discharge Date:                  Expected Discharge Plan:  Home/Self Care  In-House Referral:     Discharge planning Services  CM Consult  Post Acute Care Choice:    Choice offered to:     DME Arranged:    DME Agency:     HH Arranged:    [place] Agency:     Status of Service:  Completed, signed off  If discussed at [employer] of Stay Meetings, dates discussed:    Additional Comments:  Ella Bodo, RN 08/20/2016, 3:01 PM

## 2016-08-20 NOTE — Discharge Summary (Signed)
Physician Discharge Summary  Todd Whitehead HYQ:[id number] DOB: September 01, 1945 DOA: 08/17/2016  PCP: [name], MD  Admit date: 08/17/2016 Discharge date: 08/20/2016  Admitted From: (Home)  Disposition:  (Home )    Recommendations for Outpatient Follow-up:  1. Follow up with PCP in 1 week .  Home Health:(NO) Equipment/Devices:No  Discharge Condition:(Stable)  CODE STATUS: (FULL)  Diet recommendation: Heart Healthy    Brief/Interim Summary:  71 y.o.malewith medical history significant of CABG/COPD who smokes one half pack every day presents to the [facility] with a chief complaint of worsening epigastric abdominal pain nausea vomiting fevers and chills for the last few days WAS FOUND TO HAVE CBD dilation with acute cholecystitis and cholelethiasis. ERCP was done and failed to show the ampulla of Vater , MRCP followed showed no more CBD dilation , patter compatible with passing stone , patient underwent Lap chole on 08/19/16 , post op course complicated with penile swelling required foley removal urgently and monitoring him urinating with swelling improvement . He was able to ambulate , eat and urinate with no issues , will be discharged to follow up as an outpt .  Discharge Diagnoses:  -Acute cholecystitis with CBD dilation: ERCP has done today and failed to show the ampulla of Vater , MRCP showed no more CBD dilation , patter compatible with passing stone , s/p  Lap chol .   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  3-history of smoking 1-1/2 pack every day :counseling provided the patient is provided with nicotine patch   Discharge Instructions    Follow-up Information    [name], MD Follow up.   Specialty:  Internal Medicine Why:  CAll for follow up of Medical issues and have blood drawn the week before your return to [facility] surgery. Contact information: [place] [address] [phone]        Surgery, [place] Follow up.   Specialty:  General Surgery Why:  Our office will call you with follow up appointment.  Be at the office 30 minutes early for check in.  Bring photo ID and insurance information.  Have Labs done the at least 24 hours before you come to this clinc appointment.   Contact information: [address] [place] [address] [phone]          Allergies  Allergen Reactions  . Other     Cheese,butter,sour cream,cottage cheese    Consultations:  Surgery .   Procedures/Studies: Dg Cholangiogram Operative  Result Date: 08/19/2016 CLINICAL DATA:  71 year old male undergoing elective cholecystectomy EXAM: INTRAOPERATIVE CHOLANGIOGRAM TECHNIQUE: Cholangiographic images from the C-arm fluoroscopic device were submitted for interpretation post-operatively. Please see the procedural report for the amount of contrast and the fluoroscopy time utilized. COMPARISON:  MRCP 08/18/2016 FINDINGS: A single cine clip is submitted for interpretation. The images demonstrate cannulation of the cystic duct remanent and opacification of the biliary tree. There is moderate dilatation of the common bile duct. However, no stenosis, stricture or choledocholithiasis. Contrast material passes through the ampulla and into the duodenum. IMPRESSION: Negative intraoperative cholangiogram. Electronically Signed   By: Jacqulynn Cadet M.D.   On: 08/19/2016 16:24   Ct Abdomen Pelvis W Contrast  Result Date: 08/17/2016 CLINICAL DATA:  71 year old male with lower abdominal pain. EXAM: CT ABDOMEN AND PELVIS WITH CONTRAST TECHNIQUE: Multidetector CT imaging of the abdomen and pelvis was performed using the standard protocol  following bolus administration of intravenous contrast. CONTRAST:  <See Chart> ISOVUE-300 IOPAMIDOL (ISOVUE-300) INJECTION 61%, 17mL ISOVUE-300 IOPAMIDOL (ISOVUE-300) INJECTION 61% COMPARISON:  Abdominal CT  dated 12/16/2015 FINDINGS: Evaluation is very limited due to paucity of intra-abdominal fat. Lower chest: The visualized lung bases are clear. No intra-abdominal free fluid. A pocket of air in the anterior mid abdomen (series 2 image 42) is most likely intraluminal and represents air in the cecum. Although evaluation is very limited due to paucity of intra-abdominal fat. Pneumoperitoneum is much less likely. Correlation with clinical exam recommended. Hepatobiliary: There is moderate dilatation of the intrahepatic biliary tree, new compared to the prior CT. There is dilatation of the common bile duct measuring up to 18 mm with dilatation of the central CBD at the head of the pancreas measuring up to 11 mm. This is new compared to the prior CT. No calcified stone identified within the CBD. An ampullary lesion is not entirely excluded. Further evaluation with MRCP is recommended. The gallbladder is moderately distended. No calcified stone is seen within the gallbladder on the CT. Small stones noted on today's ultrasound. Clinical correlation is recommended. A hepatobiliary scintigraphy may provide better evaluation of the gallbladder if there is a high clinical concern for acute cholecystitis . The liver is unremarkable. Pancreas: Unremarkable. No pancreatic ductal dilatation or surrounding inflammatory changes. Spleen: Normal in size without focal abnormality. Adrenals/Urinary Tract: The adrenal glands are unremarkable. Subcentimeter bilateral renal hypodense lesions are too small to characterize but most likely represent cysts. There is no hydronephrosis on either side. The visualized ureters appear unremarkable. The urinary bladder is distended. Stomach/Bowel: Evaluation of the bowel is limited due to paucity of the intra-abdominal fat. The stomach is distended. Oral contrast is noted within the stomach and loops of small bowel. The colon is collapsed. Diffusely thickened appearance of the small bowel and colon may  be related to underdistention. Enteritis or enterocolitis is not excluded. Clinical correlation is recommended. There is no evidence of bowel obstruction. The appendix is normal. Vascular/Lymphatic: There is advanced aortoiliac atherosclerotic disease. The aorta is ectatic. The origins of the celiac axis, SMA, and IMA appear patent. The SMV, splenic vein, and main portal vein are patent. No portal venous gas identified. There is no adenopathy. Reproductive: The prostate and seminal vesicles are grossly unremarkable. Other: There is loss of subcutaneous fat and cachexia. Musculoskeletal: Degenerative changes of the spine. No acute fracture. IMPRESSION: 1. Distended gallbladder. No calcified stone seen on this CT. Stones however seen on today's ultrasound. A hepatobiliary scintigraphy may provide better evaluation of the gallbladder if there is a high clinical concern for acute cholecystitis . 2. Dilatation of the intrahepatic biliary tree and common bile duct, new compared to the prior study of indeterminate etiology. An ampullary lesion is not excluded. Further evaluation with MRCP recommended. 3. Distended stomach without evidence of mechanical gastric outlet obstruction. Findings may represent functional decreased peristalsis/gastroparesis. 4. No evidence of small-bowel obstruction. Thickened appearance of the small bowel and colon likely related to underdistention. Enterocolitis is not entirely excluded. 5. Pocket of air in the anterior mid abdomen likely intraluminal and air within the cecum. 6.  Aortic Atherosclerosis (ICD10-I70.0). Electronically Signed   By: Anner Crete M.D.   On: 08/17/2016 05:30   Mr Abdomen Mrcp Wo Contrast  Result Date: 08/18/2016 CLINICAL DATA:  Abnormal LFTs with dilated common bile duct. EXAM: MRI ABDOMEN WITHOUT CONTRAST  (INCLUDING MRCP) TECHNIQUE: Multiplanar multisequence MR imaging of the abdomen was performed. Heavily T2-weighted images of  the biliary and pancreatic  ducts were obtained, and three-dimensional MRCP images were rendered by post processing. COMPARISON:  CT scan 08/17/2016.  Abdominal ultrasound 08/17/2016. FINDINGS: Lower chest:  Unremarkable. Hepatobiliary: No focal abnormality within the liver parenchyma. Mild intrahepatic biliary duct dilatation on today's exam, decreased since the CT scan yesterday. Multiple tiny gallstones are identified, measuring in the 3-4 mm size range (well demonstrated on image 6 of series 9). There is some central flow artifact in the common bile duct on the SSFSE sequence, but no intraluminal filling defect on any of the other fluid sensitive sequences. Extrahepatic common bile duct in the head of the pancreas measures only 7 mm diameter today which compares to 12 mm when I remeasure it at the same level on yesterday's CT scan. Pancreas: The main pancreatic duct measures 2-3 mm diameter throughout. No evidence for pancreatic head mass on this noncontrast exam. Spleen: No splenomegaly. No focal mass lesion. Adrenals/Urinary Tract: Mild thickening left adrenal gland. Right adrenal gland unremarkable. Small well-defined homogeneous T2 hyperintensities are seen in both kidneys, likely cysts. No evidence for hydronephrosis. Stomach/Bowel: Stomach is nondistended. No gastric wall thickening. No evidence of outlet obstruction. Duodenum is normally positioned as is the ligament of Treitz. No small bowel or colonic dilatation. Vascular/Lymphatic: No abdominal aortic aneurysm. No bulky abdominal lymphadenopathy. Other: Trace free fluid noted around the liver and spleen. Musculoskeletal: No abnormal marrow signal within the visualized bony anatomy. IMPRESSION: 1. Cholelithiasis without choledocholithiasis. Interval decrease in common bile duct diameter, measuring 6 mm today compared to 12 mm at the same level when I remeasure on yesterday's study. Intrahepatic biliary duct dilatation appears decreased in the interval. 2. Mild diffuse prominence  main pancreatic duct. No evidence for pancreatic head mass on this noncontrast exam. 3. Probable small bilateral renal cysts. Electronically Signed   By: Misty Stanley M.D.   On: 08/18/2016 16:10   Dg Ercp  Result Date: 08/18/2016 CLINICAL DATA:  71 year old male with acute cholecystitis and biliary ductal dilatation concerning for occult choledocholithiasis EXAM: ERCP TECHNIQUE: Multiple spot images obtained with the fluoroscopic device and submitted for interpretation post-procedure. FLUOROSCOPY TIME:  Fluoroscopy Time:  0 minutes 20 seconds COMPARISON:  Abdominal ultrasound 08/17/2016; CT scan of the abdomen and pelvis 08/17/2016 FINDINGS: A single saved image demonstrates a flexible endoscope in the descending duodenum. The major papilla was not identified, and the common bile duct was not cannulated. IMPRESSION: Limited ERCP as above. These images were submitted for radiologic interpretation only. Please see the procedural report for the amount of contrast and the fluoroscopy time utilized. Electronically Signed   By: Jacqulynn Cadet M.D.   On: 08/18/2016 10:51   US Abdomen Limited Ruq  Result Date: 08/17/2016 CLINICAL DATA:  Abdominal pain and gallstones EXAM: ULTRASOUND ABDOMEN LIMITED RIGHT UPPER QUADRANT COMPARISON:  None. FINDINGS: Gallbladder: The gallbladder is distended with multiple stones that measure up to 8 mm. There is minimal wall thickening. A positive sonographic Percell Miller sign was demonstrated by the sonographer. No pericholecystic fluid. Common bile duct: Diameter: 14.3 mm, dilated. Liver: Moderate intrahepatic biliary dilatation. Liver parenchyma is normal. IMPRESSION: 1. Positive sonographic Murphy sign and cholelithiasis, suggesting acute cholecystitis. 2. Dilated common bile duct and moderate intrahepatic biliary dilatation, but no choledocholithiasis is visible on this study. Electronically Signed   By: Ulyses Jarred M.D.   On: 08/17/2016 05:07    (Echo, Carotid, EGD,  Colonoscopy, ERCP)    Subjective:   Discharge Exam: Vitals:   08/19/16 2121 08/20/16 0440  BP: Marland Kitchen)  117/55 (!) 161/70  Pulse: 64 70  Resp: 19 18  Temp: 98.6 F (37 C) 98.4 F (36.9 C)   Vitals:   08/19/16 1716 08/19/16 1731 08/19/16 2121 08/20/16 0440  BP: (!) 153/62 (!) 152/71 (!) 117/55 (!) 161/70  Pulse: 74 76 64 70  Resp: (!) 25 20 19 18   Temp:  97.8 F (36.6 C) 98.6 F (37 C) 98.4 F (36.9 C)  TempSrc:  Oral Oral Oral  SpO2: 99% 97% 95% 98%  Weight:      Height:        General: Pt is alert, awake, not in acute distress Cardiovascular: RRR, S1/S2 +, no rubs, no gallops Respiratory: CTA bilaterally, no wheezing, no rhonchi Abdominal: Soft, NT, ND, bowel sounds + Extremities: no edema, no cyanosis    The results of significant diagnostics from this hospitalization (including imaging, microbiology, ancillary and laboratory) are listed below for reference.     Microbiology: Recent Results (from the past 240 hour(s))  Surgical pcr screen     Status: None   Collection Time: 08/19/16  8:55 AM  Result Value Ref Range Status   MRSA, PCR NEGATIVE NEGATIVE Final   Staphylococcus aureus NEGATIVE NEGATIVE Final    Comment:        The Xpert SA Assay (FDA approved for NASAL specimens in patients over 10 years of age), is one component of a comprehensive surveillance program.  Test performance has been validated by [facility] for patients greater than or equal to 12 year old. It is not intended to diagnose infection nor to guide or monitor treatment.      Labs: BNP (last 3 results) No results for input(s): BNP in the last 8760 hours. Basic Metabolic Panel:  Recent Labs Lab 08/17/16 0245 08/17/16 1825 08/18/16 0344 08/20/16 0559  NA 135  --  133* 135  K 3.2*  --  3.6 3.3*  CL 98*  --  102 105  CO2 25  --  25 26  GLUCOSE 186*  --  82 127*  BUN 14  --  13 8  CREATININE 1.03 1.07 1.07 0.87  CALCIUM 9.5  --  7.9* 8.1*   Liver Function  Tests:  Recent Labs Lab 08/17/16 0245 08/18/16 0344 08/20/16 0559  AST 1,249* 284* 60*  ALT 826* 410* 167*  ALKPHOS 218* 110 84  BILITOT 2.8* 2.1* 0.6  PROT 7.4 5.2* 5.1*  ALBUMIN 4.3 2.8* 2.7*    Recent Labs Lab 08/17/16 0245  LIPASE 31   No results for input(s): AMMONIA in the last 168 hours. CBC:  Recent Labs Lab 08/17/16 0245 08/17/16 1825 08/18/16 0344 08/20/16 0559  WBC 10.8* 15.1* 11.5* 8.4  NEUTROABS  --   --  10.2*  --   HGB 13.0 10.9* 10.5* 10.6*  HCT 37.5* 31.3* 31.1* 31.2*  MCV 88.5 87.2 87.1 86.0  PLT 191 122* 113* 111*   Cardiac Enzymes: No results for input(s): CKTOTAL, CKMB, CKMBINDEX, TROPONINI in the last 168 hours. BNP: Invalid input(s): POCBNP CBG: No results for input(s): GLUCAP in the last 168 hours. D-Dimer No results for input(s): DDIMER in the last 72 hours. Hgb A1c No results for input(s): HGBA1C in the last 72 hours. Lipid Profile No results for input(s): CHOL, HDL, LDLCALC, TRIG, CHOLHDL, LDLDIRECT in the last 72 hours. Thyroid function studies No results for input(s): TSH, T4TOTAL, T3FREE, THYROIDAB in the last 72 hours.  Invalid input(s): FREET3 Anemia work up No results for input(s): VITAMINB12, FOLATE, FERRITIN, TIBC, IRON, RETICCTPCT  in the last 72 hours. Urinalysis    Component Value Date/Time   COLORURINE YELLOW (A) 08/17/2016 0533   APPEARANCEUR CLEAR (A) 08/17/2016 0533   LABSPEC 1.009 08/17/2016 0533   PHURINE 6.0 08/17/2016 0533   GLUCOSEU NEGATIVE 08/17/2016 0533   HGBUR MODERATE (A) 08/17/2016 0533   BILIRUBINUR NEGATIVE 08/17/2016 0533   KETONESUR NEGATIVE 08/17/2016 0533   PROTEINUR 30 (A) 08/17/2016 0533   NITRITE NEGATIVE 08/17/2016 0533   LEUKOCYTESUR NEGATIVE 08/17/2016 0533   Sepsis Labs Invalid input(s): PROCALCITONIN,  WBC,  LACTICIDVEN Microbiology Recent Results (from the past 240 hour(s))  Surgical pcr screen     Status: None   Collection Time: 08/19/16  8:55 AM  Result Value Ref Range  Status   MRSA, PCR NEGATIVE NEGATIVE Final   Staphylococcus aureus NEGATIVE NEGATIVE Final    Comment:        The Xpert SA Assay (FDA approved for NASAL specimens in patients over 36 years of age), is one component of a comprehensive surveillance program.  Test performance has been validated by [facility] for patients greater than or equal to 75 year old. It is not intended to diagnose infection nor to guide or monitor treatment.      Time coordinating discharge: Over 30 minutes  SIGNED:   Waldron Session, MD  [facility] 08/20/2016, 1:01 PM Pager   If 7PM-7AM, please contact night-coverage [email or web address] Password [email or web address]

## 2016-08-20 NOTE — Progress Notes (Signed)
Going home instructions given by 1st shift RN.Discharged Pt in good condition accompanied by Family member.

## 2016-08-21 NOTE — Discharge Summary (Signed)
Physician Discharge Summary  Patient ID: Todd Whitehead MRN: [id number] DOB/AGE: August 28, 1945 71 y.o.  Admit date: 08/17/2016 Discharge date: 08/21/2016  Admission Diagnoses:  Discharge Diagnoses:  Active Problems:   Bile duct calculus with acute cholecystitis   Acute cholecystitis   Discharged Condition: fair  Hospital Course: 71 year old Male presented to [facility] ED for RUQ abdominal pain. Workup was found to be significant for abdominal ultrasound demonstrating acute cholecystitis with gallbladder wall thickening and peri-cholecystic fluid, along with a 14 mm CBD and total bilirubin 2.8 with alkaline phosphate of 218. Patient was started on antibiotics and admitted accordingly to surgical service for ERCP and subsequent cholecystectomy. However, it was discovered after patient was admitted that GI coverage for ERCP was not available, and so plans were made for patient to be transferred to [facility] for same. Patient was then safely able to be transferred to [facility].  Consults: GI  Significant Diagnostic Studies: labs: total bilirubin - 2.8, alk phos - 218 and radiology: Ultrasound: acute cholecystitis with 14 mm CBD (enlarged)  Treatments: IV hydration, antibiotics, and transfer for ERCP  Discharge Exam: Blood pressure (!) 100/58, pulse 84, temperature 98 F (36.7 C), temperature source Oral, resp. rate 16, height _0  (1.727 m), weight 126 lb 1.6 oz (57.2 kg), SpO2 98 %. General appearance: alert, cooperative and no distress GI: soft and non-distended with mild RUQ abdominal tenderness to palpation, no guarding or rebound tenderness  Disposition: 01-Home or Self Care   Allergies as of 08/17/2016      Reactions   Other    Cheese,butter,sour cream,cottage cheese      Medication List    ASK your doctor about these medications   TOPROL XL 25 MG 24 hr tablet Generic drug:  metoprolol succinate Take 12.5 mg by mouth daily.        Signed: Vickie Epley 08/21/2016, 6:49 AM

## 2016-12-24 ENCOUNTER — Other Ambulatory Visit: Payer: Self-pay | Admitting: Internal Medicine

## 2016-12-24 DIAGNOSIS — N183 Type 2 diabetes mellitus with diabetic chronic kidney disease: Secondary | ICD-10-CM | POA: Insufficient documentation

## 2016-12-24 DIAGNOSIS — E1122 Type 2 diabetes mellitus with diabetic chronic kidney disease: Secondary | ICD-10-CM

## 2016-12-24 DIAGNOSIS — E119 Type 2 diabetes mellitus without complications: Secondary | ICD-10-CM | POA: Insufficient documentation

## 2016-12-31 ENCOUNTER — Ambulatory Visit
Admission: RE | Admit: 2016-12-31 | Discharge: 2016-12-31 | Disposition: A | Discharge status: Home / Self Care | Payer: Medicare Other | Source: Ambulatory Visit | Attending: Internal Medicine | Admitting: Internal Medicine

## 2016-12-31 DIAGNOSIS — Q554 Other congenital malformations of vas deferens, epididymis, seminal vesicles and prostate: Secondary | ICD-10-CM | POA: Insufficient documentation

## 2016-12-31 DIAGNOSIS — E1122 Type 2 diabetes mellitus with diabetic chronic kidney disease: Secondary | ICD-10-CM | POA: Diagnosis present

## 2016-12-31 DIAGNOSIS — N281 Cyst of kidney, acquired: Secondary | ICD-10-CM | POA: Insufficient documentation

## 2016-12-31 DIAGNOSIS — N183 Chronic kidney disease, stage 3 (moderate): Secondary | ICD-10-CM | POA: Diagnosis not present

## 2017-05-31 ENCOUNTER — Other Ambulatory Visit: Payer: Self-pay | Admitting: Physician Assistant

## 2017-05-31 ENCOUNTER — Ambulatory Visit
Admission: RE | Admit: 2017-05-31 | Discharge: 2017-05-31 | Disposition: A | Discharge status: Home / Self Care | Payer: Medicare Other | Source: Ambulatory Visit | Attending: Physician Assistant | Admitting: Physician Assistant

## 2017-05-31 DIAGNOSIS — Z9049 Acquired absence of other specified parts of digestive tract: Secondary | ICD-10-CM | POA: Insufficient documentation

## 2017-05-31 DIAGNOSIS — R1031 Right lower quadrant pain: Secondary | ICD-10-CM | POA: Diagnosis present

## 2017-05-31 MED ORDER — IOPAMIDOL (ISOVUE-370) INJECTION 76%
75.0000 mL | Freq: Once | INTRAVENOUS | Status: AC | PRN
  Administered 2017-05-31: 75 mL via INTRAVENOUS

## 2017-06-21 ENCOUNTER — Encounter (INDEPENDENT_AMBULATORY_CARE_PROVIDER_SITE_OTHER): Payer: Medicare Other | Admitting: Vascular Surgery

## 2017-06-25 ENCOUNTER — Ambulatory Visit (INDEPENDENT_AMBULATORY_CARE_PROVIDER_SITE_OTHER): Payer: Medicare Other | Admitting: Vascular Surgery

## 2017-06-25 ENCOUNTER — Encounter (INDEPENDENT_AMBULATORY_CARE_PROVIDER_SITE_OTHER): Payer: Self-pay | Admitting: Vascular Surgery

## 2017-06-25 VITALS — BP 109/59 | HR 67 | Resp 13 | Ht 65.0 in | Wt 122.0 lb

## 2017-06-25 DIAGNOSIS — Z87891 Personal history of nicotine dependence: Secondary | ICD-10-CM

## 2017-06-25 DIAGNOSIS — I700 Atherosclerosis of aorta: Secondary | ICD-10-CM | POA: Diagnosis not present

## 2017-06-25 DIAGNOSIS — I10 Essential (primary) hypertension: Secondary | ICD-10-CM | POA: Insufficient documentation

## 2017-06-25 DIAGNOSIS — R109 Unspecified abdominal pain: Secondary | ICD-10-CM | POA: Diagnosis not present

## 2017-06-25 DIAGNOSIS — F17210 Nicotine dependence, cigarettes, uncomplicated: Secondary | ICD-10-CM

## 2017-06-25 NOTE — Assessment & Plan Note (Signed)
blood pressure control important in reducing the progression of atherosclerotic disease.   

## 2017-06-25 NOTE — Assessment & Plan Note (Signed)
The patient does have significant aortoiliac atherosclerosis seen on his CT scan.  I have discussed the natural history and pathophysiology of peripheral arterial disease.  I suspect given his heavy tobacco use he likely has some infrainguinal disease as well.  We will obtain some noninvasive studies in the near future at his convenience and I will see him back following the studies.  He has no current limb threatening symptoms but he does describe claudication.  Recommend he stop smoking, increases his walking, and continues aspirin therapy.  He will also continue his statin therapy.

## 2017-06-25 NOTE — Progress Notes (Signed)
Patient ID: Todd Whitehead, male   DOB: [birth year], 72 y.o.   MRN: [id number]  Chief Complaint  Patient presents with  . New Patient (Initial Visit)    Abnormal CT of abdomen    HPI Todd Whitehead is a 72 y.o. male.  I am asked to see the patient by J. Woodard, [place] for evaluation of aortoiliac atherosclerosis seen on a recent CT scan.  The patient reports dull mid abdominal Whitehead for many months.  Is a little better now than it was but it remains present.  He had some sort of mesenteric swirling and is scheduled to see a general surgeon as well.  I have independently reviewed his CT scan.  He does have some mild to moderate aortoiliac atherosclerotic changes as well.  The patient does describe Whitehead in his legs with activity.  He says he can walk about a quarter of a mile before having to stop and rest.  Both legs are affected.  He has no ulceration or infection.  He says a year or 2 ago he can walk up to a mile without discomfort.  No clear inciting event or causative factor that started his leg symptoms.  He is a heavy smoker although he has cut back.   Past Medical History:  Diagnosis Date  . Anxiety   . Arthritis   . COPD (chronic obstructive pulmonary disease) ([place])    smoker  . Dysrhythmia   . Hypertension    on  no meds  . Melanoma ([place])    Resected from Left upper arm.     Past Surgical History:  Procedure Laterality Date  . BACK SURGERY  01/19/98  . CARDIAC CATHETERIZATION N/A 08/19/2015   Procedure: Left Heart Cath and Coronary Angiography;  Surgeon: [name], MD;  Location: [facility];  Service: Cardiovascular;  Laterality: N/A;  . CHOLECYSTECTOMY N/A 08/19/2016   Procedure: LAPAROSCOPIC CHOLECYSTECTOMY WITH INTRAOPERATIVE CHOLANGIOGRAM;  Surgeon: [name], MD;  [facility];  Service: General;  Laterality: N/A;  . COLON SURGERY  age 64 days old   " bowel was twisted up"  . COLONOSCOPY WITH PROPOFOL N/A 03/19/2016   Procedure: COLONOSCOPY WITH PROPOFOL;   Surgeon: [name], MD;  Location: [facility];  Service: Endoscopy;  Laterality: N/A;  . CORONARY ARTERY BYPASS GRAFT    . ERCP N/A 08/18/2016   Procedure: ENDOSCOPIC RETROGRADE CHOLANGIOPANCREATOGRAPHY (ERCP);  Surgeon: [name], MD;  Location: [facility];  Service: Gastroenterology;  Laterality: N/A;  . EYE SURGERY Left 03/26/11  . LUMBAR LAMINECTOMY/DECOMPRESSION MICRODISCECTOMY Right 10/02/2012   Procedure: Right Lumbar Five-Sacral One Lumbar laminotomy/Microdiskectomy;  Surgeon: [name], MD;  Location: [facility];  Service: Neurosurgery;  Laterality: Right;  Right Lumbar Five-Sacral One Lumbar laminotomy/Microdiskectomy    Family History No bleeding disorders, clotting disorders, autoimmune diseases, or aneurysms  Social History Social History   Tobacco Use  . Smoking status: Current Every Day Smoker    Packs/day: 2.00    Years: 56.00    Pack years: 112.00    Types: Cigarettes  . Smokeless tobacco: Never Used  Substance Use Topics  . Alcohol use: No  . Drug use: No    Allergies  Allergen Reactions  . Other     Cheese,butter,sour cream,cottage cheese    Current Outpatient Medications  Medication Sig Dispense Refill  . acetaminophen (TYLENOL) 500 MG tablet Take 1 tablet (500 mg total) by mouth every 6 (six) hours as needed. 30 tablet  0  . ALPRAZolam (XANAX) 1 MG tablet Take 1 mg by mouth 3 (three) times daily as needed for anxiety.    Marland Kitchen aspirin (ASPIRIN CHILDRENS) 81 MG chewable tablet Chew 1 tablet (81 mg total) by mouth daily. 36 tablet 11  . atorvastatin (LIPITOR) 40 MG tablet Take by mouth.    . brimonidine (ALPHAGAN) 0.2 % ophthalmic solution     . busPIRone (BUSPAR) 30 MG tablet Take by mouth.    . citalopram (CELEXA) 20 MG tablet Take by mouth.    . ferrous sulfate 324 (65 Fe) MG TBEC Take by mouth.    . furosemide (LASIX) 20 MG tablet Take by mouth.    . latanoprost (XALATAN) 0.005 % ophthalmic solution INT 1 GTT INTO OU HS  2  .  metoprolol succinate (TOPROL XL) 25 MG 24 hr tablet Take 12.5 mg by mouth daily.    . potassium chloride SA (K-DUR,KLOR-CON) 20 MEQ tablet Take by mouth.    . sildenafil (REVATIO) 20 MG tablet Take by mouth.    . tamsulosin (FLOMAX) 0.4 MG CAPS capsule Take 1 capsule (0.4 mg total) by mouth daily after breakfast. 30 capsule 2  . nicotine (NICODERM CQ - DOSED IN MG/24 HOURS) 21 mg/24hr patch Place 1 patch (21 mg total) onto the skin daily. (Patient not taking: Reported on 06/25/2017) 28 patch 0  . ranitidine (ZANTAC) 300 MG tablet TK 1 T PO NIGHTLY  8  . traMADol (ULTRAM) 50 MG tablet Take by mouth.     No current facility-administered medications for this visit.       REVIEW OF SYSTEMS (Negative unless checked)  Constitutional: [] Weight loss  [] Fever  [] Chills Cardiac: [] Chest Whitehead   [] Chest pressure   [] Palpitations   [] Shortness of breath when laying flat   [] Shortness of breath at rest   [x] Shortness of breath with exertion. Vascular:  [x] Whitehead in legs with walking   [] Whitehead in legs at rest   [] Whitehead in legs when laying flat   [x] Claudication   [] Whitehead in feet when walking  [] Whitehead in feet at rest  [] Whitehead in feet when laying flat   [] History of DVT   [] Phlebitis   [x] Swelling in legs   [] Varicose veins   [] Non-healing ulcers Pulmonary:   [] Uses home oxygen   [] Productive cough   [] Hemoptysis   [] Wheeze  [x] COPD   [] Asthma Neurologic:  [] Dizziness  [] Blackouts   [] Seizures   [] History of stroke   [] History of TIA  [] Aphasia   [] Temporary blindness   [] Dysphagia   [] Weakness or numbness in arms   [] Weakness or numbness in legs Musculoskeletal:  [x] Arthritis   [] Joint swelling   [] Joint Whitehead   [] Low back Whitehead Hematologic:  [] Easy bruising  [] Easy bleeding   [] Hypercoagulable state   [] Anemic  [] Hepatitis Gastrointestinal:  [] Blood in stool   [] Vomiting blood  [] Gastroesophageal reflux/heartburn   [x] Abdominal Whitehead Genitourinary:  [] Chronic kidney disease   [] Difficult urination  [] Frequent urination   [] Burning with urination   [] Hematuria Skin:  [] Rashes   [] Ulcers   [] Wounds Psychological:  [x] History of anxiety   []  History of major depression.    Physical Exam BP (!) 109/59 (BP Location: Right Arm, Patient Position: Sitting)   Pulse 67   Resp 13   Ht 5\' 5"  (1.651 m)   Wt 122 lb (55.3 kg)   BMI 20.30 kg/m  Gen:  WD/WN, NAD Head: /AT, No temporalis wasting.  Ear/Nose/Throat: Hearing grossly intact, nares w/o erythema or drainage, oropharynx  w/o Erythema/Exudate Eyes: Conjunctiva clear, sclera non-icteric  Neck: trachea midline.   Pulmonary:  Good air movement, respirations not labored, no use of accessory muscles Cardiac: RRR, no JVD Vascular:  Vessel Right Left  Radial Palpable Palpable                          PT  1+ palpable  2+ palpable  DP  1+ palpable  1+ palpable   Gastrointestinal: soft, mildly tender to palpation, not distended Musculoskeletal: M/S 5/5 throughout.  Extremities without ischemic changes.  No deformity or atrophy.  Trace right lower extremity edema. Neurologic: Sensation grossly intact in extremities.  Symmetrical.  Speech is fluent. Motor exam as listed above. Psychiatric: Judgment intact, Mood & affect appropriate for pt's clinical situation. Dermatologic: No rashes or ulcers noted.  No cellulitis or open wounds.    Radiology Ct Abdomen Pelvis W Contrast  Result Date: 05/31/2017 CLINICAL DATA:  Right lower quadrant abdominal Whitehead EXAM: CT ABDOMEN AND PELVIS WITH CONTRAST TECHNIQUE: Multidetector CT imaging of the abdomen and pelvis was performed using the standard protocol following bolus administration of intravenous contrast. CONTRAST:  48mL ISOVUE-370 IOPAMIDOL (ISOVUE-370) INJECTION 76% COMPARISON:  MRI abdomen dated 08/18/2016. CT abdomen/pelvis dated 08/17/2016. FINDINGS: Lower chest: Lung bases are clear. Hepatobiliary: Liver is within normal limits. Status post cholecystectomy. No intrahepatic or extrahepatic ductal dilatation.  Pancreas: Within normal limits. Spleen: Within normal limits. Adrenals/Urinary Tract: Adrenal glands within normal limits. Bilateral renal cysts, including a dominant 13 mm cyst in the left upper kidney (series 7/image 8). Distended bladder. Stomach/Bowel: Distended stomach. 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 However, there is no evidence of bowel obstruction. Appendix is not discretely visualized. No colonic wall thickening or inflammatory changes. Vascular/Lymphatic: No evidence of abdominal aortic aneurysm. Atherosclerotic calcifications of the abdominal aorta and branch vessels. No suspicious abdominopelvic lymphadenopathy. Reproductive: Prostate is grossly unremarkable. Other: No abdominopelvic ascites. Musculoskeletal: Degenerative changes of the visualized thoracolumbar spine. IMPRESSION: Appendix is not discretely visualized. There are no findings to suggest acute appendicitis. No evidence of bowel obstruction. Prior cholecystectomy. Additional ancillary findings as above. Electronically Signed   By: Julian Hy M.D.   On: 05/31/2017 18:09    Labs No results found for this or any previous visit (from the past 2160 hour(s)).  Assessment/Plan:  Hypertension blood pressure control important in reducing the progression of atherosclerotic disease.   Personal history of tobacco use, presenting hazards to health We had a discussion for approximately 3-4 minutes regarding the absolute need for smoking cessation due to the deleterious nature of tobacco on the vascular system. We discussed the tobacco use would diminish patency of any intervention, and likely significantly worsen progressio of disease. We discussed multiple agents for quitting including replacement therapy or medications to reduce cravings such as Chantix. The patient voices their understanding of the importance of smoking cessation.   Abdominal  Whitehead To see general surgery for swirling of the mesentery.  Aortic atherosclerosis ([place]) The patient does have significant aortoiliac atherosclerosis seen on his CT scan.  I have discussed the natural history and pathophysiology of peripheral arterial disease.  I suspect given his heavy tobacco use he likely has some infrainguinal disease as well.  We will obtain some noninvasive studies in the near future at his convenience and I will see him back following the studies.  He has no current limb threatening symptoms but he does  describe claudication.  Recommend he stop smoking, increases his walking, and continues aspirin therapy.  He will also continue his statin therapy.      Todd Whitehead 06/25/2017, 10:09 AM   This note was created with Dragon medical transcription system.  Any errors from dictation are unintentional.

## 2017-06-25 NOTE — Assessment & Plan Note (Signed)
To see general surgery for swirling of the mesentery.

## 2017-06-25 NOTE — Assessment & Plan Note (Signed)
We had a discussion for approximately 3-4 minutes regarding the absolute need for smoking cessation due to the deleterious nature of tobacco on the vascular system. We discussed the tobacco use would diminish patency of any intervention, and likely significantly worsen progressio of disease. We discussed multiple agents for quitting including replacement therapy or medications to reduce cravings such as Chantix. The patient voices their understanding of the importance of smoking cessation.  

## 2017-06-25 NOTE — Patient Instructions (Signed)

## 2017-07-03 ENCOUNTER — Telehealth: Payer: Self-pay | Admitting: *Deleted

## 2017-07-03 DIAGNOSIS — Z122 Encounter for screening for malignant neoplasm of respiratory organs: Secondary | ICD-10-CM

## 2017-07-03 DIAGNOSIS — Z87891 Personal history of nicotine dependence: Secondary | ICD-10-CM

## 2017-07-03 NOTE — Telephone Encounter (Signed)
Notified patient that annual lung cancer screening low dose CT scan is due currently or will be in near future. Confirmed that patient is within the age range of 55-77, and asymptomatic, (no signs or symptoms of lung cancer). Patient denies illness that would prevent curative treatment for lung cancer if found. Verified smoking history, (current, 113 pack year). The shared decision making visit was done 07/03/16. Patient is agreeable for CT scan being scheduled.

## 2017-07-09 ENCOUNTER — Ambulatory Visit
Admission: RE | Admit: 2017-07-09 | Discharge: 2017-07-09 | Disposition: A | Discharge status: Home / Self Care | Payer: Medicare Other | Source: Ambulatory Visit | Attending: Oncology | Admitting: Oncology

## 2017-07-09 DIAGNOSIS — J439 Emphysema, unspecified: Secondary | ICD-10-CM | POA: Insufficient documentation

## 2017-07-09 DIAGNOSIS — Z122 Encounter for screening for malignant neoplasm of respiratory organs: Secondary | ICD-10-CM | POA: Diagnosis present

## 2017-07-09 DIAGNOSIS — I700 Atherosclerosis of aorta: Secondary | ICD-10-CM | POA: Diagnosis not present

## 2017-07-09 DIAGNOSIS — Z87891 Personal history of nicotine dependence: Secondary | ICD-10-CM

## 2017-07-12 ENCOUNTER — Encounter (INDEPENDENT_AMBULATORY_CARE_PROVIDER_SITE_OTHER): Payer: Medicare Other | Admitting: Vascular Surgery

## 2017-07-15 ENCOUNTER — Encounter: Payer: Self-pay | Admitting: *Deleted

## 2017-07-30 ENCOUNTER — Encounter: Payer: Self-pay | Admitting: General Surgery

## 2017-08-22 ENCOUNTER — Encounter: Payer: Self-pay | Admitting: *Deleted

## 2017-08-27 ENCOUNTER — Ambulatory Visit: Payer: Medicare Other | Admitting: General Surgery

## 2017-08-27 ENCOUNTER — Encounter: Payer: Self-pay | Admitting: General Surgery

## 2017-08-27 VITALS — BP 158/68 | HR 73 | Resp 18 | Ht 68.0 in | Wt 126.0 lb

## 2017-08-27 DIAGNOSIS — R1013 Epigastric pain: Secondary | ICD-10-CM | POA: Diagnosis not present

## 2017-08-27 NOTE — Progress Notes (Signed)
Patient ID: Todd Whitehead, male   DOB: 1945/10/15, 72 y.o.   MRN: [id number]  Chief Complaint  Patient presents with  . Abdominal Pain    HPI Todd Whitehead is a 72 y.o. male.  Here today for evaluation of an abnormal CT and abdominal pain referred by Laurine Blazer PA. He states he was having mid abdomen pain that started 3-4 months after his gall bladder surgery. The pain lasted all day but not every day. He states they put him on ranitidine for the abdomina pain. Denies any nausea or vomiting. He states he stopped taking the ranitidine and the abdominal pain stopped. He states one doctor told him that his intestines were twisted and the other doctor said he didn't see anything unusual. He states his bowels have been regular and daily. CT scan was 05-31-17. He is retired from [employer], working with [employer].  HPI  Past Medical History:  Diagnosis Date  . Anxiety   . Arthritis   . Colon polyp   . COPD (chronic obstructive pulmonary disease) ([place])    smoker  . Dysrhythmia   . Glaucoma   . Hypertension    on  no meds  . Melanoma ([place])    Resected from Left upper arm.   . Prostate enlargement     Past Surgical History:  Procedure Laterality Date  . BACK SURGERY  01/19/98  . CARDIAC CATHETERIZATION N/A 08/19/2015   Procedure: Left Heart Cath and Coronary Angiography;  Surgeon: [name], MD;  Location: [facility];  Service: Cardiovascular;  Laterality: N/A;  . CHOLECYSTECTOMY N/A 08/19/2016   Procedure: LAPAROSCOPIC CHOLECYSTECTOMY WITH INTRAOPERATIVE CHOLANGIOGRAM;  Surgeon: [name], MD;  [facility];  Service: General;  Laterality: N/A;  . COLON SURGERY  age 9 days old   " bowel was twisted up"  . COLONOSCOPY WITH PROPOFOL N/A 03/19/2016   Procedure: COLONOSCOPY WITH PROPOFOL;  Surgeon: [name], MD;  Location: [facility] ENDOSCOPY;  Service: Endoscopy;  Laterality: N/A;  . CORONARY ARTERY BYPASS GRAFT  2017  . ERCP N/A 08/18/2016   Procedure: ENDOSCOPIC RETROGRADE CHOLANGIOPANCREATOGRAPHY (ERCP);  Surgeon: [name], MD;  Location: [facility];  Service: Gastroenterology;  Laterality: N/A;  . EYE SURGERY Left 03/26/11  . LUMBAR LAMINECTOMY/DECOMPRESSION MICRODISCECTOMY Right 10/02/2012   Procedure: Right Lumbar Five-Sacral One Lumbar laminotomy/Microdiskectomy;  Surgeon: [name], MD;  Location: [facility];  Service: Neurosurgery;  Laterality: Right;  Right Lumbar Five-Sacral One Lumbar laminotomy/Microdiskectomy    Family History  Problem Relation Age of Onset  . Heart attack Father   . Aneurysm Brother   . Lung cancer Brother     Social History Social History   Tobacco Use  . Smoking status: Current Every Day Smoker    Packs/day: 1.00    Years: 56.00    Pack years: 56.00    Types: Cigarettes  . Smokeless tobacco: Never Used  Substance Use Topics  . Alcohol use: No  . Drug use: No    Allergies  Allergen Reactions  . Other     Cheese,butter,sour cream,cottage cheese    Current Outpatient Medications  Medication Sig Dispense Refill  . acetaminophen (TYLENOL) 500 MG tablet Take 1 tablet (500 mg total) by mouth every 6 (six) hours as needed. 30 tablet 0  . ALPRAZolam (XANAX) 1 MG tablet Take 1 mg by mouth 3 (three) times daily as needed for anxiety.    Marland Kitchen aspirin EC 81 MG tablet Take 81 mg by mouth  daily.    . atorvastatin (LIPITOR) 40 MG tablet Take by mouth.    . B Complex Vitamins (VITAMIN B COMPLEX PO) Take by mouth daily.    . brimonidine (ALPHAGAN) 0.2 % ophthalmic solution Place 1 drop into both eyes every morning.     . busPIRone (BUSPAR) 30 MG tablet Take 30 mg by mouth 2 (two) times daily.    . citalopram (CELEXA) 20 MG tablet Take by mouth.    . ferrous sulfate 324 (65 Fe) MG TBEC Take by mouth.    . latanoprost (XALATAN) 0.005 % ophthalmic solution INT 1 GTT INTO OU HS  2  . metoprolol succinate (TOPROL XL) 25 MG 24 hr tablet Take 12.5 mg by mouth daily.    . tamsulosin (FLOMAX)  0.4 MG CAPS capsule Take 1 capsule (0.4 mg total) by mouth daily after breakfast. 30 capsule 2   No current facility-administered medications for this visit.     Review of Systems Review of Systems  Constitutional: Negative.   Respiratory: Negative.   Cardiovascular: Negative.   Gastrointestinal: Positive for abdominal pain. Negative for nausea and vomiting.    Blood pressure (!) 158/68, pulse 73, resp. rate 18, height 5\' 8"  (1.727 m), weight 126 lb (57.2 kg), SpO2 93 %.  Physical Exam Physical Exam  Constitutional: He is oriented to person, place, and time. He appears well-developed and well-nourished.  HENT:  Mouth/Throat: No oropharyngeal exudate.  Eyes: Conjunctivae are normal. No scleral icterus.  Neck: Neck supple. Carotid bruit is present.  Cardiovascular: Normal rate, regular rhythm and normal heart sounds.  Pulmonary/Chest: Effort normal and breath sounds normal.  Abdominal: Soft. Normal appearance and bowel sounds are normal. There is no tenderness. No hernia.    Neurological: He is alert and oriented to person, place, and time.  Skin: Skin is warm and dry.  Psychiatric: His behavior is normal.    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 However, there is no evidence of bowel obstruction.  August 18, 2016 MRI obtained to assess for choledocholithiasis in part reported: Stomach/Bowel: Stomach is nondistended. No gastric wall thickening. No evidence of outlet obstruction. Duodenum is normally positioned as is the ligament of Treitz. No small bowel or colonic dilatation.  Vascular/Lymphatic: No abdominal aortic aneurysm. No bulky abdominal Lymphadenopathy. Patient had an ERCP August 18, 2016 and subsequent cholecystectomy the following day.  Both procedures completed in [place].   Assessment    Resolution of abdominal pain with  abnormal imaging, no associated tumor, weight loss or active GI symptoms.    Plan    Follow up as needed. The patient is aware to call back for any questions, new concerns or return of symptoms.       HPI, Physical Exam, Assessment and Plan have been scribed under the direction and in the presence of Robert Bellow, MD. Karie Fetch, RN  I have completed the exam and reviewed the above documentation for accuracy and completeness.  I agree with the above.  Haematologist has been used and any errors in dictation or transcription are unintentional.  Hervey Ard, M.D., F.A.C.S.  Todd Whitehead 08/29/2017, 5:52 AM

## 2017-08-27 NOTE — Patient Instructions (Addendum)
The patient is aware to call back for any questions or new concerns.  

## 2017-09-24 ENCOUNTER — Other Ambulatory Visit (INDEPENDENT_AMBULATORY_CARE_PROVIDER_SITE_OTHER): Payer: Self-pay | Admitting: Vascular Surgery

## 2017-09-24 DIAGNOSIS — I739 Peripheral vascular disease, unspecified: Secondary | ICD-10-CM

## 2017-09-24 DIAGNOSIS — I700 Atherosclerosis of aorta: Secondary | ICD-10-CM

## 2017-09-25 ENCOUNTER — Ambulatory Visit (INDEPENDENT_AMBULATORY_CARE_PROVIDER_SITE_OTHER): Payer: Medicare Other

## 2017-09-25 ENCOUNTER — Ambulatory Visit (INDEPENDENT_AMBULATORY_CARE_PROVIDER_SITE_OTHER): Payer: Medicare Other | Admitting: Nurse Practitioner

## 2017-09-25 ENCOUNTER — Encounter (INDEPENDENT_AMBULATORY_CARE_PROVIDER_SITE_OTHER): Payer: Self-pay | Admitting: Vascular Surgery

## 2017-09-25 VITALS — BP 120/63 | HR 64 | Resp 17 | Ht 66.5 in | Wt 124.0 lb

## 2017-09-25 DIAGNOSIS — I739 Peripheral vascular disease, unspecified: Secondary | ICD-10-CM | POA: Diagnosis not present

## 2017-09-25 DIAGNOSIS — I700 Atherosclerosis of aorta: Secondary | ICD-10-CM

## 2017-09-25 DIAGNOSIS — J449 Chronic obstructive pulmonary disease, unspecified: Secondary | ICD-10-CM | POA: Diagnosis not present

## 2017-09-25 DIAGNOSIS — Z87891 Personal history of nicotine dependence: Secondary | ICD-10-CM | POA: Diagnosis not present

## 2017-09-25 DIAGNOSIS — I10 Essential (primary) hypertension: Secondary | ICD-10-CM | POA: Diagnosis not present

## 2017-09-25 DIAGNOSIS — J4489 Other specified chronic obstructive pulmonary disease: Secondary | ICD-10-CM

## 2017-09-27 ENCOUNTER — Telehealth (INDEPENDENT_AMBULATORY_CARE_PROVIDER_SITE_OTHER): Payer: Self-pay | Admitting: Nurse Practitioner

## 2017-09-27 ENCOUNTER — Other Ambulatory Visit (INDEPENDENT_AMBULATORY_CARE_PROVIDER_SITE_OTHER): Payer: Self-pay | Admitting: Nurse Practitioner

## 2017-09-27 ENCOUNTER — Encounter (INDEPENDENT_AMBULATORY_CARE_PROVIDER_SITE_OTHER): Payer: Self-pay

## 2017-09-28 ENCOUNTER — Encounter (INDEPENDENT_AMBULATORY_CARE_PROVIDER_SITE_OTHER): Payer: Self-pay | Admitting: Nurse Practitioner

## 2017-09-28 DIAGNOSIS — I739 Peripheral vascular disease, unspecified: Secondary | ICD-10-CM | POA: Insufficient documentation

## 2017-09-28 NOTE — Progress Notes (Signed)
Subjective:    Patient ID: Todd Whitehead, male    DOB: 03/14/1945, 72 y.o.   MRN: [id number] Chief Complaint  Patient presents with  . Follow-up    Ultrasound ABI and Arterial follow up    HPI  Todd Whitehead is a 72 y.o. male who presents with claudicating pain barely able to walk around the block or riding his bicycle without extreme pain.  The patient reports increasing weakening of the legs as well.  He also endorses beginning to have rest pain symptoms as well.  Symptom onset has been worsening for a time period of several month(s). Severity is described as  moderate to severe. Course of his symptoms over time is worsening.  The patient feels that the pain and cramping is equal in both legs.  The cramping radiates from his hips to his thighs to his calves.   Other than his claudicating pain, the patient denies chest pain.  Patient denies fever, chills, nausea, vomiting, diarrhea.  The patient denies amaurosis fugax.  Mr. Rayford underwent bilateral ABIs today.  His right ABI is 0.98 with a TBI of 0.80.  His left ABI is 1.06 with a TBI of 0.87.  He has biphasic waveforms in his right lower extremity as well as biphasic/triphasic waveforms in his left lower extremity.  Mr. Stetson also underwent a CT in May of this year, which reveals atherosclerotic lesions in his iliac arteries as well.  CT shows left iliac appears to have worse atherosclerotic disease of the right.   Constitutional: [] Weight loss  [] Fever  [] Chills Cardiac: [] Chest pain   [] Chest pressure   [] Palpitations   [] Shortness of breath when laying flat   [x] Shortness of breath with exertion. Vascular:  [x] Pain in legs with walking   [x] Pain in legs with standing  [] History of DVT   [] Phlebitis   [] Swelling in legs   [] Varicose veins   [] Non-healing ulcers Pulmonary:   [] Uses home oxygen   [] Productive cough   [] Hemoptysis   [] Wheeze  [x] COPD   [] Asthma Neurologic:  [] Dizziness   [] Seizures   [] History of stroke   [] History of TIA   [] Aphasia   [] Vissual changes   [] Weakness or numbness in arm   [] Weakness or numbness in leg Musculoskeletal:   [] Joint swelling   [] Joint pain   [] Low back pain Hematologic:  [] Easy bruising  [] Easy bleeding   [] Hypercoagulable state   [] Anemic Gastrointestinal:  [] Diarrhea   [] Vomiting  [] Gastroesophageal reflux/heartburn   [] Difficulty swallowing. Genitourinary:  [] Chronic kidney disease   [] Difficult urination  [] Frequent urination   [] Blood in urine Skin:  [] Rashes   [] Ulcers  Psychological:  [] History of anxiety   [x]  History of major depression.     Objective:   Physical Exam  BP 120/63 (BP Location: Right Arm, Patient Position: Sitting)   Pulse 64   Resp 17   Ht 5' 6.5" (1.689 m)   Wt 124 lb (56.2 kg)   BMI 19.71 kg/m   Past Medical History:  Diagnosis Date  . Anxiety   . Arthritis   . Colon polyp   . COPD (chronic obstructive pulmonary disease) ([place])    smoker  . Dysrhythmia   . Glaucoma   . Hypertension    on  no meds  . Melanoma ([place])    Resected from Left upper arm.   . Prostate enlargement      Gen: WD/WN, NAD Head: [place]/AT, No temporalis wasting.  Ear/Nose/Throat: Hearing grossly intact, nares w/o erythema  or drainage Eyes: PER, EOMI, sclera nonicteric.  Neck: Supple, no masses.  No JVD.  Pulmonary:  Good air movement, no use of accessory muscles.  Cardiac: RRR Vascular:  Vessel Right Left  PT palpable palpable  Gastrointestinal: soft, non-distended. No guarding/no peritoneal signs.  Musculoskeletal: M/S 5/5 throughout.  No deformity or atrophy.  Neurologic: Pain and light touch intact in extremities.  Symmetrical.  Speech is fluent. Motor exam as listed above. Psychiatric: Judgment intact, Mood & affect appropriate for pt's clinical situation. Dermatologic: No Venous rashes. No Ulcers Noted.  No changes consistent with cellulitis. Lymph : No Cervical lymphadenopathy, no lichenification or skin changes of chronic lymphedema.   Social History    Socioeconomic History  . Marital status: Divorced    Spouse name: Not on file  . Number of children: Not on file  . Years of education: Not on file  . Highest education level: Not on file  Occupational History  . Not on file  Social Needs  . Financial resource strain: Not on file  . Food insecurity:    Worry: Not on file    Inability: Not on file  . Transportation needs:    Medical: Not on file    Non-medical: Not on file  Tobacco Use  . Smoking status: Current Every Day Smoker    Packs/day: 1.00    Years: 56.00    Pack years: 56.00    Types: Cigarettes  . Smokeless tobacco: Never Used  Substance and Sexual Activity  . Alcohol use: No  . Drug use: No  . Sexual activity: Not Currently  Lifestyle  . Physical activity:    Days per week: Not on file    Minutes per session: Not on file  . Stress: Not on file  Relationships  . Social connections:    Talks on phone: Not on file    Gets together: Not on file    Attends religious service: Not on file    Active member of club or organization: Not on file    Attends meetings of clubs or organizations: Not on file    Relationship status: Not on file  . Intimate partner violence:    Fear of current or ex partner: Not on file    Emotionally abused: Not on file    Physically abused: Not on file    Forced sexual activity: Not on file  Other Topics Concern  . Not on file  Social History Narrative  . Not on file    Past Surgical History:  Procedure Laterality Date  . BACK SURGERY  01/19/98  . CARDIAC CATHETERIZATION N/A 08/19/2015   Procedure: Left Heart Cath and Coronary Angiography;  Surgeon: [name], MD;  Location: [facility];  Service: Cardiovascular;  Laterality: N/A;  . CHOLECYSTECTOMY N/A 08/19/2016   Procedure: LAPAROSCOPIC CHOLECYSTECTOMY WITH INTRAOPERATIVE CHOLANGIOGRAM;  Surgeon: [name], MD;  Location: [facility];  Service: General;  Laterality: N/A;  . COLON SURGERY  age 58 days old   " bowel  was twisted up"  . COLONOSCOPY WITH PROPOFOL N/A 03/19/2016   Procedure: COLONOSCOPY WITH PROPOFOL;  Surgeon: [name], MD;  [facility];  Service: Endoscopy;  Laterality: N/A;  . CORONARY ARTERY BYPASS GRAFT  2017  . ERCP N/A 08/18/2016   Procedure: ENDOSCOPIC RETROGRADE CHOLANGIOPANCREATOGRAPHY (ERCP);  Surgeon: [name], MD;  Location: [facility];  Service: Gastroenterology;  Laterality: N/A;  . EYE SURGERY Left 03/26/11  . LUMBAR LAMINECTOMY/DECOMPRESSION MICRODISCECTOMY Right 10/02/2012  Procedure: Right Lumbar Five-Sacral One Lumbar laminotomy/Microdiskectomy;  Surgeon: [name], MD;  Location: [facility];  Service: Neurosurgery;  Laterality: Right;  Right Lumbar Five-Sacral One Lumbar laminotomy/Microdiskectomy    Family History  Problem Relation Age of Onset  . Heart attack Father   . Aneurysm Brother   . Lung cancer Brother     Allergies  Allergen Reactions  . Other     Cheese,butter,sour cream,cottage cheese       Assessment & Plan:   1. PVD (peripheral vascular disease) with claudication [facility])  Mr. Tabet underwent bilateral ABIs today.  His right ABI is 0.98 with a TBI of 0.80.  His left ABI is 1.06 with a TBI of 0.87.  He has biphasic waveforms in his right lower extremity as well as biphasic/triphasic waveforms in his left lower extremity.  Mr. Newbury also underwent a CT in May of this year, which reveals atherosclerotic lesions in his iliac arteries as well.  CT shows left iliac appears to have worse atherosclerotic disease of the right.  Recommend:  The patient has experienced increased symptoms and is now describing lifestyle limiting claudication and mild rest pain.   Given the severity of the patient's lower extremity symptoms the patient should undergo angiography and intervention.  Risk and benefits were reviewed the patient.  Indications for the procedure were reviewed.  All questions were answered, the patient agrees to proceed.    The patient should continue walking and begin a more formal exercise program.  The patient should continue antiplatelet therapy and aggressive treatment of the lipid abnormalities  The patient will follow up with me after the angiogram.   2. Essential hypertension Continue antihypertensive medications as already ordered, these medications have been reviewed and there are no changes at this time.   3. COPD (chronic obstructive pulmonary disease) with chronic bronchitis (HCC) Continue pulmonary medications and aerosols as already ordered, these medications have been reviewed and there are no changes at this time.    4. Personal history of tobacco use, presenting hazards to health Patient was advised to stop smoking.  Patient was advised that any intervention will perform a be lessened  by continued smoking patient understands.  Current Outpatient Medications on File Prior to Visit  Medication Sig Dispense Refill  . acetaminophen (TYLENOL) 500 MG tablet Take 1 tablet (500 mg total) by mouth every 6 (six) hours as needed. 30 tablet 0  . ALPRAZolam (XANAX) 1 MG tablet Take 1 mg by mouth 3 (three) times daily as needed for anxiety.    Marland Kitchen aspirin EC 81 MG tablet Take 81 mg by mouth daily.    Marland Kitchen atorvastatin (LIPITOR) 40 MG tablet Take 40 mg by mouth daily.     . B Complex Vitamins (VITAMIN B COMPLEX PO) Take by mouth daily.    . brimonidine (ALPHAGAN) 0.2 % ophthalmic solution Place 1 drop into both eyes every morning.     . busPIRone (BUSPAR) 30 MG tablet Take 30 mg by mouth 2 (two) times daily.    . citalopram (CELEXA) 20 MG tablet Take 20 mg by mouth daily.     . ferrous sulfate 324 (65 Fe) MG TBEC Take 65 mg by mouth daily.     Marland Kitchen latanoprost (XALATAN) 0.005 % ophthalmic solution Place 1 drop into both eyes at bedtime.   2  . metoprolol succinate (TOPROL XL) 25 MG 24 hr tablet Take 12.5 mg by mouth daily.    . tamsulosin (FLOMAX) 0.4 MG CAPS capsule  Take 1 capsule (0.4 mg total) by mouth  daily after breakfast. 30 capsule 2   No current facility-administered medications on file prior to visit.     There are no Patient Instructions on file for this visit. No follow-ups on file.   Kris Hartmann, NP

## 2017-10-03 ENCOUNTER — Encounter: Payer: Self-pay | Admitting: *Deleted

## 2017-10-03 ENCOUNTER — Ambulatory Visit
Admission: RE | Admit: 2017-10-03 | Discharge: 2017-10-03 | Disposition: A | Discharge status: Home / Self Care | Payer: Medicare Other | Source: Ambulatory Visit | Attending: Vascular Surgery | Admitting: Vascular Surgery

## 2017-10-03 ENCOUNTER — Encounter
Admission: RE | Disposition: A | Discharge status: Home / Self Care | Payer: Self-pay | Source: Ambulatory Visit | Attending: Vascular Surgery

## 2017-10-03 DIAGNOSIS — Z79899 Other long term (current) drug therapy: Secondary | ICD-10-CM | POA: Diagnosis not present

## 2017-10-03 DIAGNOSIS — Z7982 Long term (current) use of aspirin: Secondary | ICD-10-CM | POA: Diagnosis not present

## 2017-10-03 DIAGNOSIS — Z981 Arthrodesis status: Secondary | ICD-10-CM | POA: Diagnosis not present

## 2017-10-03 DIAGNOSIS — Z85820 Personal history of malignant melanoma of skin: Secondary | ICD-10-CM | POA: Insufficient documentation

## 2017-10-03 DIAGNOSIS — J449 Chronic obstructive pulmonary disease, unspecified: Secondary | ICD-10-CM | POA: Diagnosis not present

## 2017-10-03 DIAGNOSIS — Z801 Family history of malignant neoplasm of trachea, bronchus and lung: Secondary | ICD-10-CM | POA: Diagnosis not present

## 2017-10-03 DIAGNOSIS — F17210 Nicotine dependence, cigarettes, uncomplicated: Secondary | ICD-10-CM | POA: Insufficient documentation

## 2017-10-03 DIAGNOSIS — I70213 Atherosclerosis of native arteries of extremities with intermittent claudication, bilateral legs: Secondary | ICD-10-CM | POA: Insufficient documentation

## 2017-10-03 DIAGNOSIS — I10 Essential (primary) hypertension: Secondary | ICD-10-CM | POA: Diagnosis not present

## 2017-10-03 DIAGNOSIS — Z955 Presence of coronary angioplasty implant and graft: Secondary | ICD-10-CM | POA: Diagnosis not present

## 2017-10-03 DIAGNOSIS — M1990 Unspecified osteoarthritis, unspecified site: Secondary | ICD-10-CM | POA: Insufficient documentation

## 2017-10-03 DIAGNOSIS — H409 Unspecified glaucoma: Secondary | ICD-10-CM | POA: Diagnosis not present

## 2017-10-03 DIAGNOSIS — Z91018 Allergy to other foods: Secondary | ICD-10-CM | POA: Diagnosis not present

## 2017-10-03 DIAGNOSIS — Z9049 Acquired absence of other specified parts of digestive tract: Secondary | ICD-10-CM | POA: Insufficient documentation

## 2017-10-03 DIAGNOSIS — F419 Anxiety disorder, unspecified: Secondary | ICD-10-CM | POA: Insufficient documentation

## 2017-10-03 DIAGNOSIS — Z86010 Personal history of colonic polyps: Secondary | ICD-10-CM | POA: Diagnosis not present

## 2017-10-03 DIAGNOSIS — Z8249 Family history of ischemic heart disease and other diseases of the circulatory system: Secondary | ICD-10-CM | POA: Insufficient documentation

## 2017-10-03 DIAGNOSIS — Z9889 Other specified postprocedural states: Secondary | ICD-10-CM | POA: Insufficient documentation

## 2017-10-03 DIAGNOSIS — I739 Peripheral vascular disease, unspecified: Secondary | ICD-10-CM

## 2017-10-03 DIAGNOSIS — I70212 Atherosclerosis of native arteries of extremities with intermittent claudication, left leg: Secondary | ICD-10-CM

## 2017-10-03 HISTORY — PX: LOWER EXTREMITY ANGIOGRAPHY: CATH118251

## 2017-10-03 LAB — CREATININE, SERUM
Creatinine, Ser: 0.93 mg/dL (ref 0.61–1.24)
GFR calc non Af Amer: >60 mL/min (ref >60)

## 2017-10-03 LAB — BUN: BUN: 13 mg/dL (ref 8–23)

## 2017-10-03 SURGERY — LOWER EXTREMITY ANGIOGRAPHY
Anesthesia: Moderate Sedation | Laterality: Left

## 2017-10-03 MED ORDER — HEPARIN SODIUM (PORCINE) 1000 UNIT/ML IJ SOLN
INTRAMUSCULAR | Status: DC | PRN
  Administered 2017-10-03: 4000 [IU] via INTRAVENOUS

## 2017-10-03 MED ORDER — HYDROMORPHONE HCL 1 MG/ML IJ SOLN: 1.0000 mg | Freq: Once | INTRAMUSCULAR | Status: DC | PRN

## 2017-10-03 MED ORDER — IOPAMIDOL (ISOVUE-300) INJECTION 61%
INTRAVENOUS | Status: DC | PRN
  Administered 2017-10-03: 75 mL via INTRA_ARTERIAL

## 2017-10-03 MED ORDER — CEFAZOLIN SODIUM-DEXTROSE 2-4 GM/100ML-% IV SOLN
INTRAVENOUS | Status: AC
  Filled 2017-10-03: qty 100

## 2017-10-03 MED ORDER — SODIUM CHLORIDE 0.9 % IV SOLN
INTRAVENOUS | Status: DC
  Administered 2017-10-03: 13:00:00 via INTRAVENOUS

## 2017-10-03 MED ORDER — CEFAZOLIN SODIUM 1 G IJ SOLR
2.0000 g | Freq: Once | INTRAMUSCULAR | Status: AC
  Administered 2017-10-03: 2 g via INTRAVENOUS
  Filled 2017-10-03: qty 20

## 2017-10-03 MED ORDER — CLOPIDOGREL BISULFATE 75 MG PO TABS: 75.0000 mg | ORAL_TABLET | Freq: Every day | ORAL | 11 refills | Status: DC

## 2017-10-03 MED ORDER — FENTANYL CITRATE (PF) 100 MCG/2ML IJ SOLN
INTRAMUSCULAR | Status: DC | PRN
  Administered 2017-10-03: 50 ug via INTRAVENOUS

## 2017-10-03 MED ORDER — MIDAZOLAM HCL 2 MG/2ML IJ SOLN
INTRAMUSCULAR | Status: DC | PRN
  Administered 2017-10-03: 2 mg via INTRAVENOUS

## 2017-10-03 MED ORDER — HEPARIN SODIUM (PORCINE) 1000 UNIT/ML IJ SOLN
INTRAMUSCULAR | Status: AC
  Filled 2017-10-03: qty 1

## 2017-10-03 MED ORDER — FENTANYL CITRATE (PF) 100 MCG/2ML IJ SOLN
INTRAMUSCULAR | Status: AC
  Filled 2017-10-03: qty 2

## 2017-10-03 MED ORDER — MIDAZOLAM HCL 5 MG/5ML IJ SOLN
INTRAMUSCULAR | Status: AC
  Filled 2017-10-03: qty 5

## 2017-10-03 MED ORDER — ONDANSETRON HCL 4 MG/2ML IJ SOLN: 4.0000 mg | Freq: Four times a day (QID) | INTRAMUSCULAR | Status: DC | PRN

## 2017-10-03 SURGICAL SUPPLY — 17 items
BALLN LUTONIX AV 9X40X75 (BALLOONS) ×2
BALLOON LUTONIX AV 9X40X75 (BALLOONS) IMPLANT
CATH BEACON 5 .035 65 RIM TIP (CATHETERS) ×1 IMPLANT
CATH PIG 70CM (CATHETERS) ×1 IMPLANT
COVER PROBE U/S 5X48 (MISCELLANEOUS) ×1 IMPLANT
DEVICE PRESTO INFLATION (MISCELLANEOUS) ×1 IMPLANT
DEVICE STARCLOSE SE CLOSURE (Vascular Products) ×1 IMPLANT
GLIDEWIRE ADV .035X180CM (WIRE) ×1 IMPLANT
INTRODUCER 7FR 23CM (INTRODUCER) ×1 IMPLANT
PACK ANGIOGRAPHY (CUSTOM PROCEDURE TRAY) ×2 IMPLANT
PREP CHG 10.5 TEAL (MISCELLANEOUS) ×1 IMPLANT
SHEATH BRITE TIP 5FRX11 (SHEATH) ×1 IMPLANT
STENT LIFESTREAM 8X37X80 (Permanent Stent) ×1 IMPLANT
SYR MEDRAD MARK V 150ML (SYRINGE) ×1 IMPLANT
TOWEL OR 17X26 4PK STRL BLUE (TOWEL DISPOSABLE) ×1 IMPLANT
TUBING CONTRAST HIGH PRESS 72 (TUBING) ×2 IMPLANT
WIRE J 3MM .035X145CM (WIRE) ×1 IMPLANT

## 2017-10-03 NOTE — Op Note (Signed)
[facility]  Percutaneous Study/Intervention Procedural Note   Date of Surgery: 10/03/2017  Surgeon(s):Rechelle Niebla    Assistants:none  Pre-operative Diagnosis: PAD with claudication  Post-operative diagnosis:  Same  Procedure(s) Performed:             1.  Ultrasound guidance for vascular access right femoral artery             2.  Catheter placement into left SFA from right femoral approach             3.  Aortogram and selective left lower extremity angiogram             4.   Balloon expandable stent placement to the left common iliac artery with a an 8 mm diameter by 37 mm length lifestream stent             5.  StarClose closure device right femoral artery  EBL: 5 cc  Contrast: 40 cc  Fluoro Time: 3.2 minutes  Moderate Conscious Sedation Time: approximately 30 minutes using 2 mg of Versed and 50 mcg of Fentanyl              Indications:  Patient is a 72 y.o.male with disabling claudication. The patient has noninvasive study showing aortoiliac disease with mild reduction in flow. The patient is brought in for angiography for further evaluation and potential treatment. Risks and benefits are discussed and informed consent is obtained.   Procedure:  The patient was identified and appropriate procedural time out was performed.  The patient was then placed supine on the table and prepped and draped in the usual sterile fashion. Moderate conscious sedation was administered during a face to face encounter with the patient throughout the procedure with my supervision of the RN administering medicines and monitoring the patient's vital signs, pulse oximetry, telemetry and mental status throughout from the start of the procedure until the patient was taken to the recovery room. Ultrasound was used to evaluate the right common femoral artery.  It was patent .  A digital ultrasound image was acquired.  A Seldinger needle was used to access the right common femoral artery  under direct ultrasound guidance and a permanent image was performed.  A 0.035 J wire was advanced without resistance and a 5Fr sheath was placed.  Pigtail catheter was placed into the aorta and an AP aortogram was performed. The renal arteries appeared widely patent.  The aorta was calcific but not stenotic.  The right iliac system had diffuse calcific disease but no stenoses that appear to be greater than 30%.  There was an ulcerated moderate stenosis in the left common iliac artery 60% range.  There was calcific stenosis in the left external iliac artery but nothing greater than 30 or 40%. I then crossed the aortic bifurcation and advanced to the left femoral head.  This turned out to be in the proximal left SFA due to a high femoral bifurcation on the left selective left lower extremity angiogram was then performed. This demonstrated calcification of the common femoral artery, profunda femoris artery, and superficial femoral artery but no stenoses of greater than 30 to 40% in any vessel with the worst area being at Hunter's canal.  The popliteal artery was reasonably normal and there appeared to be two-vessel runoff distally without focal stenosis in the tibial vessels. The patient was systemically heparinized and a 7 French 23 cm sheath was then placed over the Terumo Advantage wire after the diagnostic catheter was  removed. I then used an 8 mm diameter by 37 mm length lifestream balloon expandable stent in the left common iliac artery to treat this ulcerated stenosis.  A surprisingly tight waist was seen which resolved in about 10 to 12 atm.  This was then postdilated with a 9 mm balloon at the proximal edge and completion imaging showed less than 10% residual stenosis in the left common iliac. I elected to terminate the procedure. The sheath was removed and StarClose closure device was deployed in the right femoral artery with excellent hemostatic result. The patient was taken to the recovery room in stable  condition having tolerated the procedure well.  Findings:               Aortogram:  The renal arteries appeared widely patent.  The aorta was calcific but not stenotic.  The right iliac system had diffuse calcific disease but no stenoses that appear to be greater than 30%.  There was an ulcerated moderate stenosis in the left common iliac artery 60% range.  There was calcific stenosis in the left external iliac artery but nothing greater than 30 or 40%.             Left Lower Extremity:  This demonstrated calcification of the common femoral artery, profunda femoris artery, and superficial femoral artery but no stenoses of greater than 30 to 40% in any vessel with the worst area being at Hunter's canal.  The popliteal artery was reasonably normal and there appeared to be two-vessel runoff distally without focal stenosis in the tibial vessels   Disposition: Patient was taken to the recovery room in stable condition having tolerated the procedure well.  Complications: None  Leotis Pain 10/03/2017 2:17 PM   This note was created with Dragon Medical transcription system. Any errors in dictation are purely unintentional.

## 2017-10-03 NOTE — Discharge Instructions (Signed)
Angiogram An angiogram, also called angiography, is a procedure used to look at the blood vessels. In this procedure, dye is injected through a long, thin tube (catheter) into an artery. X-rays are then taken. The X-rays will show if there is a blockage or problem in a blood vessel. Tell a health care provider about:  Any allergies you have, including allergies to shellfish or contrast dye.  All medicines you are taking, including vitamins, herbs, eye drops, creams, and over-the-counter medicines.  Any problems you or family members have had with anesthetic medicines.  Any blood disorders you have.  Any surgeries you have had.  Any previous kidney problems or failure you have had.  Any medical conditions you have.  Possibility of pregnancy, if this applies. What are the risks? Generally, an angiogram is a safe procedure. However, as with any procedure, problems can occur. Possible problems include:  Injury to the blood vessels, including rupture or bleeding.  Infection or bruising at the catheter site.  Allergic reaction to the dye or contrast used.  Kidney damage from the dye or contrast used.  Blood clots that can lead to a stroke or heart attack.  What happens before the procedure?  Do not eat or drink after midnight on the night before the procedure, or as directed by your health care provider.  Ask your health care provider if you may drink enough water to take any needed medicines the morning of the procedure. What happens during the procedure?  You may be given a medicine to help you relax (sedative) before and during the procedure. This medicine is given through an IV access tube that is inserted into one of your veins.  The area where the catheter will be inserted will be washed and shaved. This is usually done in the groin but may be done in the fold of your arm (near your elbow) or in the wrist.  A medicine will be given to numb the area where the catheter will  be inserted (local anesthetic).  The catheter will be inserted with a guide wire into an artery. The catheter is guided by using a type of X-ray (fluoroscopy) to the blood vessel being examined.  Dye is then injected into the catheter, and X-rays are taken. The dye helps to show where any narrowing or blockages are located. What happens after the procedure?  If the procedure is done through the leg, you will be kept in bed lying flat for several hours. You will be instructed to not bend or cross your legs.  The insertion site will be checked frequently.  The pulse in your feet or wrist will be checked frequently.  Additional blood tests, X-rays, and electrocardiography may be done.  You may need to stay in the hospital overnight for observation. This information is not intended to replace advice given to you by your health care provider. Make sure you discuss any questions you have with your health care provider. Document Released: 10/25/2004 Document Revised: 06/29/2015 Document Reviewed: 06/18/2012 Elsevier Interactive Patient Education  2017 [place]. Groin Insertion Instructions-If you lose feeling or develop tingling or pain in your leg or foot after the procedure, please walk around first.  If the discomfort does not improve , contact your physician and proceed to the nearest emergency room.  Loss of feeling in your leg might mean that a blockage has formed in the artery and this can be appropriately treated.  Limit your activity for the next two days after your procedure.  Avoid stooping, bending, heavy lifting or exertion as this may put pressure on the insertion site.  Resume normal activities in 48 hours.  You may shower after 24 hours but avoid excessive warm water and do not scrub the site.  Remove clear dressing in 48 hours.  If you have had a closure device inserted, do not soak in a tub bath or a hot tub for at least one week.  No driving for 48 hours after discharge.  After  the procedure, check the insertion site occasionally.  If any oozing occurs or there is apparent swelling, firm pressure over the site will prevent a bruise from forming.  You can not hurt anything by pressing directly on the site.  The pressure stops the bleeding by allowing a small clot to form.  If the bleeding continues after the pressure has been applied for more than 15 minutes, call 911 or go to the nearest emergency room.    The x-ray dye causes you to pass a considerate amount of urine.  For this reason, you will be asked to drink plenty of liquids after the procedure to prevent dehydration.  You may resume you regular diet.  Avoid caffeine products.    For pain at the site of your procedure, take non-aspirin medicines such as Tylenol.  Medications: A. Hold Metformin for 48 hours if applicable.  B. Continue taking all your present medications at home unless your doctor prescribes any changes.

## 2017-10-03 NOTE — H&P (Signed)
[facility] History & Physical Update  The patient was interviewed and re-examined.  The patient's previous History and Physical has been reviewed and is unchanged.  There is no change in the plan of care. We plan to proceed with the scheduled procedure.  Leotis Pain, MD  10/03/2017, 1:21 PM

## 2017-10-04 ENCOUNTER — Encounter: Payer: Self-pay | Admitting: Vascular Surgery

## 2017-10-23 ENCOUNTER — Encounter (INDEPENDENT_AMBULATORY_CARE_PROVIDER_SITE_OTHER): Payer: Self-pay

## 2017-10-23 ENCOUNTER — Other Ambulatory Visit (INDEPENDENT_AMBULATORY_CARE_PROVIDER_SITE_OTHER): Payer: Self-pay | Admitting: Nurse Practitioner

## 2017-10-23 ENCOUNTER — Ambulatory Visit (INDEPENDENT_AMBULATORY_CARE_PROVIDER_SITE_OTHER): Payer: Medicare Other | Admitting: Nurse Practitioner

## 2017-10-23 ENCOUNTER — Encounter (INDEPENDENT_AMBULATORY_CARE_PROVIDER_SITE_OTHER): Payer: Self-pay | Admitting: Nurse Practitioner

## 2017-10-23 VITALS — BP 114/62 | HR 68 | Resp 17 | Ht 68.0 in | Wt 125.0 lb

## 2017-10-23 DIAGNOSIS — J449 Chronic obstructive pulmonary disease, unspecified: Secondary | ICD-10-CM

## 2017-10-23 DIAGNOSIS — I10 Essential (primary) hypertension: Secondary | ICD-10-CM

## 2017-10-23 DIAGNOSIS — I739 Peripheral vascular disease, unspecified: Secondary | ICD-10-CM

## 2017-10-23 DIAGNOSIS — Z9862 Peripheral vascular angioplasty status: Secondary | ICD-10-CM

## 2017-10-23 DIAGNOSIS — F17210 Nicotine dependence, cigarettes, uncomplicated: Secondary | ICD-10-CM

## 2017-10-23 NOTE — Progress Notes (Signed)
Subjective:    Patient ID: Todd Whitehead, male    DOB: September 26, 1945, 72 y.o.   MRN: [id number] Chief Complaint  Patient presents with  . Follow-up    Follow up for angio results    HPI  Todd Whitehead is a 72 y.o. male The patient returns to the office for followup and review status post angiogram with intervention. The patient notes improvement in the  Left lower extremity symptoms. No interval shortening of the patient's claudication distance or rest pain symptoms.No new ulcers or wounds have occurred since the last visit.  Todd Whitehead continues to have claudication-like symptoms in his right lower extremity ranging from the buttocks to the thighs to the leg.  He endorses weakness of the right lower extremity with continued ambulation or bicycle riding.  There have been no significant changes to the patient's overall health care.  The patient denies amaurosis fugax or recent TIA symptoms. There are no recent neurological changes noted. The patient denies history of DVT, PE or superficial thrombophlebitis. The patient denies recent episodes of angina or shortness of breath.    Review of Systems   Review of Systems: Negative Unless Checked Constitutional: [] Weight loss  [] Fever  [] Chills Cardiac: [] Chest pain   ? Atrial Fibrillation  [] Palpitations   [] Shortness of breath when laying flat   [] Shortness of breath with exertion. Vascular:  [x] Pain in legs with walking   [] Pain in legs with standing  [] History of DVT   [] Phlebitis   [] Swelling in legs   [] Varicose veins   [] Non-healing ulcers Pulmonary:   [] Uses home oxygen   [] Productive cough   [] Hemoptysis   [] Wheeze  [] COPD   [] Asthma Neurologic:  [] Dizziness   [] Seizures   [] History of stroke   [] History of TIA  [] Aphasia   [] Vissual changes   [] Weakness or numbness in arm   [x] Weakness or numbness in leg Musculoskeletal:   [] Joint swelling   [] Joint pain   [] Low back pain  ? History of Knee Replacement Hematologic:  [] Easy bruising   [] Easy bleeding   [] Hypercoagulable state   [] Anemic Gastrointestinal:  [] Diarrhea   [] Vomiting  [] Gastroesophageal reflux/heartburn   [] Difficulty swallowing. Genitourinary:  [] Chronic kidney disease   [] Difficult urination  [] Anuric   [] Blood in urine Skin:  [] Rashes   [] Ulcers  Psychological:  [] History of anxiety   []  History of major depression  ? Memory Difficulties     Objective:   Physical Exam  BP 114/62 (BP Location: Right Arm, Patient Position: Sitting)   Pulse 68   Resp 17   Ht 5\' 8"  (1.727 m)   Wt 125 lb (56.7 kg)   BMI 19.01 kg/m   Past Medical History:  Diagnosis Date  . Anxiety   . Arthritis   . Colon polyp   . COPD (chronic obstructive pulmonary disease) ([place])    smoker  . Dysrhythmia   . Glaucoma   . Hypertension    on  no meds  . Melanoma ([place])    Resected from Left upper arm.   . Peripheral vascular disease ([place])   . Prostate enlargement      Gen: WD/WN, NAD frail-appearing Head: [place]/AT, No temporalis wasting.  Ear/Nose/Throat: Hearing grossly intact, nares w/o erythema or drainage Eyes: PER, EOMI, sclera nonicteric.  Neck: Supple, no masses.  No JVD.  Pulmonary:  Good air movement, no use of accessory muscles.  Cardiac: RRR Vascular:  Vessel Right Left  Dorsalis Pedis Palpable Palpable  Posterior Tibial Palpable Palpable  Gastrointestinal: soft, non-distended. No guarding/no peritoneal signs.  Musculoskeletal: M/S 5/5 throughout.  No deformity or atrophy.  Neurologic: Pain and light touch intact in extremities.  Symmetrical.  Speech is fluent. Motor exam as listed above. Psychiatric: Judgment intact, Mood & affect appropriate for pt's clinical situation. Dermatologic: No Venous rashes. No Ulcers Noted.  No changes consistent with cellulitis. Lymph : No Cervical lymphadenopathy, no lichenification or skin changes of chronic lymphedema.   Social History   Socioeconomic History  . Marital status: Divorced    Spouse name: Not on file  .  Number of children: Not on file  . Years of education: Not on file  . Highest education level: Not on file  Occupational History  . Not on file  Social Needs  . Financial resource strain: Not on file  . Food insecurity:    Worry: Not on file    Inability: Not on file  . Transportation needs:    Medical: Not on file    Non-medical: Not on file  Tobacco Use  . Smoking status: Current Every Day Smoker    Packs/day: 1.00    Years: 56.00    Pack years: 56.00    Types: Cigarettes  . Smokeless tobacco: Never Used  Substance and Sexual Activity  . Alcohol use: No  . Drug use: No  . Sexual activity: Not Currently  Lifestyle  . Physical activity:    Days per week: Not on file    Minutes per session: Not on file  . Stress: Not on file  Relationships  . Social connections:    Talks on phone: Not on file    Gets together: Not on file    Attends religious service: Not on file    Active member of club or organization: Not on file    Attends meetings of clubs or organizations: Not on file    Relationship status: Not on file  . Intimate partner violence:    Fear of current or ex partner: Not on file    Emotionally abused: Not on file    Physically abused: Not on file    Forced sexual activity: Not on file  Other Topics Concern  . Not on file  Social History Narrative  . Not on file    Past Surgical History:  Procedure Laterality Date  . BACK SURGERY  01/19/98  . CARDIAC CATHETERIZATION N/A 08/19/2015   Procedure: Left Heart Cath and Coronary Angiography;  Surgeon: [name], MD;  Location: [facility];  Service: Cardiovascular;  Laterality: N/A;  . CHOLECYSTECTOMY N/A 08/19/2016   Procedure: LAPAROSCOPIC CHOLECYSTECTOMY WITH INTRAOPERATIVE CHOLANGIOGRAM;  Surgeon: [name], MD;  Location: [facility];  Service: General;  Laterality: N/A;  . COLON SURGERY  age 44 days old   " bowel was twisted up"  . COLONOSCOPY WITH PROPOFOL N/A 03/19/2016   Procedure: COLONOSCOPY  WITH PROPOFOL;  Surgeon: [name], MD;  Location[facility];  Service: Endoscopy;  Laterality: N/A;  . CORONARY ARTERY BYPASS GRAFT  2017  . ERCP N/A 08/18/2016   Procedure: ENDOSCOPIC RETROGRADE CHOLANGIOPANCREATOGRAPHY (ERCP);  Surgeon: [name], MD;  Location: [facility];  Service: Gastroenterology;  Laterality: N/A;  . EYE SURGERY Left 03/26/11  . LOWER EXTREMITY ANGIOGRAPHY Left 10/03/2017   Procedure: LOWER EXTREMITY ANGIOGRAPHY;  Surgeon: [name], MD;  Location: [place] [facility];  Service: Cardiovascular;  Laterality: Left;  . LUMBAR LAMINECTOMY/DECOMPRESSION MICRODISCECTOMY Right 10/02/2012   Procedure: Right Lumbar Five-Sacral One Lumbar laminotomy/Microdiskectomy;  Surgeon: [name], MD;  Location: [facility];  Service: Neurosurgery;  Laterality: Right;  Right Lumbar Five-Sacral One Lumbar laminotomy/Microdiskectomy    Family History  Problem Relation Age of Onset  . Heart attack Father   . Aneurysm Brother   . Lung cancer Brother     Allergies  Allergen Reactions  . Other     Cheese,butter,sour cream,cottage cheese  . Poison Ivy Extract Rash  . Poison Oak Extract Rash       Assessment & Plan:   1. PVD (peripheral vascular disease) with claudication (HCC) Recommend:  The patient has experienced increased symptoms and is now describing lifestyle limiting claudication and mild rest pain of the right lower extremity  Given the severity of the patient's right lower extremity symptoms the patient should undergo angiography and intervention.  Risk and benefits were reviewed the patient.  Indications for the procedure were reviewed.  All questions were answered, the patient agrees to proceed.   The patient should continue walking and begin a more formal exercise program.  The patient should continue antiplatelet therapy and aggressive treatment of the lipid abnormalities  The patient will follow up with me after the angiogram.   2.  Essential hypertension Continue antihypertensive medications as already ordered, these medications have been reviewed and there are no changes at this time.   3. COPD (chronic obstructive pulmonary disease) with chronic bronchitis (HCC) Continue pulmonary medications and aerosols as already ordered, these medications have been reviewed and there are no changes at this time.   Current Outpatient Medications on File Prior to Visit  Medication Sig Dispense Refill  . acetaminophen (TYLENOL) 500 MG tablet Take 1 tablet (500 mg total) by mouth every 6 (six) hours as needed. 30 tablet 0  . ALPRAZolam (XANAX) 1 MG tablet Take 1 mg by mouth 3 (three) times daily as needed for anxiety.    Marland Kitchen aspirin EC 81 MG tablet Take 81 mg by mouth daily.    Marland Kitchen atorvastatin (LIPITOR) 40 MG tablet Take 40 mg by mouth daily.     . B Complex Vitamins (VITAMIN B COMPLEX PO) Take by mouth daily.    . brimonidine (ALPHAGAN) 0.2 % ophthalmic solution Place 1 drop into both eyes every morning.     . busPIRone (BUSPAR) 30 MG tablet Take 30 mg by mouth 2 (two) times daily.    . citalopram (CELEXA) 20 MG tablet Take 20 mg by mouth daily.     . clopidogrel (PLAVIX) 75 MG tablet Take 1 tablet (75 mg total) by mouth daily. 30 tablet 11  . ferrous sulfate 324 (65 Fe) MG TBEC Take 65 mg by mouth daily.     Marland Kitchen latanoprost (XALATAN) 0.005 % ophthalmic solution Place 1 drop into both eyes at bedtime.   2  . metoprolol succinate (TOPROL XL) 25 MG 24 hr tablet Take 12.5 mg by mouth daily.    . nicotine polacrilex (COMMIT) 4 MG lozenge Take 4 mg by mouth as needed for smoking cessation.    . tamsulosin (FLOMAX) 0.4 MG CAPS capsule Take 1 capsule (0.4 mg total) by mouth daily after breakfast. 30 capsule 2  . traMADol (ULTRAM) 50 MG tablet Take 50 mg by mouth 2 (two) times daily as needed for pain.  2   No current facility-administered medications on file prior to visit.     There are no Patient Instructions on file for this visit. No  follow-ups on file.   Kris Hartmann, NP

## 2017-10-30 ENCOUNTER — Encounter
Admission: RE | Admit: 2017-10-30 | Discharge: 2017-10-30 | Disposition: A | Discharge status: Home / Self Care | Payer: Medicare Other | Source: Ambulatory Visit | Attending: Vascular Surgery | Admitting: Vascular Surgery

## 2017-10-30 DIAGNOSIS — J449 Chronic obstructive pulmonary disease, unspecified: Secondary | ICD-10-CM | POA: Diagnosis not present

## 2017-10-30 DIAGNOSIS — M1990 Unspecified osteoarthritis, unspecified site: Secondary | ICD-10-CM | POA: Diagnosis not present

## 2017-10-30 DIAGNOSIS — I739 Peripheral vascular disease, unspecified: Secondary | ICD-10-CM | POA: Insufficient documentation

## 2017-10-30 DIAGNOSIS — F17210 Nicotine dependence, cigarettes, uncomplicated: Secondary | ICD-10-CM | POA: Diagnosis not present

## 2017-10-30 DIAGNOSIS — Z7982 Long term (current) use of aspirin: Secondary | ICD-10-CM | POA: Diagnosis not present

## 2017-10-30 DIAGNOSIS — I70213 Atherosclerosis of native arteries of extremities with intermittent claudication, bilateral legs: Secondary | ICD-10-CM | POA: Diagnosis present

## 2017-10-30 DIAGNOSIS — I10 Essential (primary) hypertension: Secondary | ICD-10-CM | POA: Diagnosis not present

## 2017-10-30 DIAGNOSIS — Z7902 Long term (current) use of antithrombotics/antiplatelets: Secondary | ICD-10-CM | POA: Diagnosis not present

## 2017-10-30 DIAGNOSIS — F419 Anxiety disorder, unspecified: Secondary | ICD-10-CM | POA: Diagnosis not present

## 2017-10-30 HISTORY — DX: Acute myocardial infarction, unspecified: I21.9

## 2017-10-30 HISTORY — DX: Cardiac murmur, unspecified: R01.1

## 2017-10-30 LAB — CREATININE, SERUM: CREATININE: 0.88 mg/dL (ref 0.61–1.24)

## 2017-10-30 LAB — BUN: BUN: 13 mg/dL (ref 8–23)

## 2017-10-30 MED ORDER — DEXTROSE 5 % IV SOLN
2.0000 g | Freq: Once | INTRAVENOUS | Status: DC
  Filled 2017-10-30: qty 20

## 2017-10-31 ENCOUNTER — Encounter: Payer: Self-pay | Admitting: *Deleted

## 2017-10-31 ENCOUNTER — Ambulatory Visit
Admission: RE | Admit: 2017-10-31 | Discharge: 2017-10-31 | Disposition: A | Discharge status: Home / Self Care | Payer: Medicare Other | Source: Ambulatory Visit | Attending: Vascular Surgery | Admitting: Vascular Surgery

## 2017-10-31 ENCOUNTER — Encounter
Admission: RE | Disposition: A | Discharge status: Home / Self Care | Payer: Self-pay | Source: Ambulatory Visit | Attending: Vascular Surgery

## 2017-10-31 DIAGNOSIS — F419 Anxiety disorder, unspecified: Secondary | ICD-10-CM | POA: Insufficient documentation

## 2017-10-31 DIAGNOSIS — M1990 Unspecified osteoarthritis, unspecified site: Secondary | ICD-10-CM | POA: Insufficient documentation

## 2017-10-31 DIAGNOSIS — I70213 Atherosclerosis of native arteries of extremities with intermittent claudication, bilateral legs: Secondary | ICD-10-CM | POA: Insufficient documentation

## 2017-10-31 DIAGNOSIS — Z7982 Long term (current) use of aspirin: Secondary | ICD-10-CM | POA: Insufficient documentation

## 2017-10-31 DIAGNOSIS — Z7902 Long term (current) use of antithrombotics/antiplatelets: Secondary | ICD-10-CM | POA: Insufficient documentation

## 2017-10-31 DIAGNOSIS — F17210 Nicotine dependence, cigarettes, uncomplicated: Secondary | ICD-10-CM | POA: Insufficient documentation

## 2017-10-31 DIAGNOSIS — I70211 Atherosclerosis of native arteries of extremities with intermittent claudication, right leg: Secondary | ICD-10-CM

## 2017-10-31 DIAGNOSIS — J449 Chronic obstructive pulmonary disease, unspecified: Secondary | ICD-10-CM | POA: Insufficient documentation

## 2017-10-31 DIAGNOSIS — I739 Peripheral vascular disease, unspecified: Secondary | ICD-10-CM

## 2017-10-31 DIAGNOSIS — I10 Essential (primary) hypertension: Secondary | ICD-10-CM | POA: Insufficient documentation

## 2017-10-31 DIAGNOSIS — Z9862 Peripheral vascular angioplasty status: Secondary | ICD-10-CM

## 2017-10-31 HISTORY — PX: LOWER EXTREMITY ANGIOGRAPHY: CATH118251

## 2017-10-31 SURGERY — LOWER EXTREMITY ANGIOGRAPHY
Anesthesia: Moderate Sedation | Laterality: Right

## 2017-10-31 MED ORDER — MIDAZOLAM HCL 5 MG/5ML IJ SOLN
INTRAMUSCULAR | Status: AC
  Filled 2017-10-31: qty 5

## 2017-10-31 MED ORDER — SODIUM CHLORIDE 0.9% FLUSH: 3.0000 mL | Freq: Two times a day (BID) | INTRAVENOUS | Status: DC

## 2017-10-31 MED ORDER — LABETALOL HCL 5 MG/ML IV SOLN: 10.0000 mg | INTRAVENOUS | Status: DC | PRN

## 2017-10-31 MED ORDER — HEPARIN SODIUM (PORCINE) 1000 UNIT/ML IJ SOLN
INTRAMUSCULAR | Status: AC
  Filled 2017-10-31: qty 1

## 2017-10-31 MED ORDER — SODIUM CHLORIDE 0.9 % IV SOLN
INTRAVENOUS | Status: DC
  Administered 2017-10-31: 13:00:00 via INTRAVENOUS

## 2017-10-31 MED ORDER — HEPARIN SODIUM (PORCINE) 1000 UNIT/ML IJ SOLN
INTRAMUSCULAR | Status: DC | PRN
  Administered 2017-10-31: 5000 [IU] via INTRAVENOUS

## 2017-10-31 MED ORDER — CEFAZOLIN SODIUM-DEXTROSE 2-4 GM/100ML-% IV SOLN
INTRAVENOUS | Status: AC
  Administered 2017-10-31: 13:00:00
  Filled 2017-10-31: qty 100

## 2017-10-31 MED ORDER — ONDANSETRON HCL 4 MG/2ML IJ SOLN: 4.0000 mg | Freq: Four times a day (QID) | INTRAMUSCULAR | Status: DC | PRN

## 2017-10-31 MED ORDER — LIDOCAINE-EPINEPHRINE (PF) 1 %-1:200000 IJ SOLN
INTRAMUSCULAR | Status: AC
  Filled 2017-10-31: qty 30

## 2017-10-31 MED ORDER — HEPARIN (PORCINE) IN NACL 1000-0.9 UT/500ML-% IV SOLN
INTRAVENOUS | Status: AC
  Filled 2017-10-31: qty 1000

## 2017-10-31 MED ORDER — FENTANYL CITRATE (PF) 100 MCG/2ML IJ SOLN
INTRAMUSCULAR | Status: AC
  Filled 2017-10-31: qty 2

## 2017-10-31 MED ORDER — HYDRALAZINE HCL 20 MG/ML IJ SOLN: 5.0000 mg | INTRAMUSCULAR | Status: DC | PRN

## 2017-10-31 MED ORDER — FENTANYL CITRATE (PF) 100 MCG/2ML IJ SOLN
INTRAMUSCULAR | Status: DC | PRN
  Administered 2017-10-31 (×2): 25 ug via INTRAVENOUS
  Administered 2017-10-31: 50 ug via INTRAVENOUS

## 2017-10-31 MED ORDER — HYDROMORPHONE HCL 1 MG/ML IJ SOLN: 1.0000 mg | Freq: Once | INTRAMUSCULAR | Status: DC | PRN

## 2017-10-31 MED ORDER — MIDAZOLAM HCL 2 MG/2ML IJ SOLN
INTRAMUSCULAR | Status: DC | PRN
  Administered 2017-10-31: 2 mg via INTRAVENOUS
  Administered 2017-10-31 (×2): 1 mg via INTRAVENOUS

## 2017-10-31 MED ORDER — SODIUM CHLORIDE 0.9% FLUSH: 3.0000 mL | INTRAVENOUS | Status: DC | PRN

## 2017-10-31 MED ORDER — SODIUM CHLORIDE 0.9 % IV SOLN: 250.0000 mL | INTRAVENOUS | Status: DC | PRN

## 2017-10-31 MED ORDER — SODIUM CHLORIDE 0.9 % IV SOLN: INTRAVENOUS | Status: DC

## 2017-10-31 MED ORDER — ACETAMINOPHEN 325 MG PO TABS: 650.0000 mg | ORAL_TABLET | ORAL | Status: DC | PRN

## 2017-10-31 MED ORDER — IOPAMIDOL (ISOVUE-300) INJECTION 61%
INTRAVENOUS | Status: DC | PRN
  Administered 2017-10-31: 50 mL via INTRA_ARTERIAL

## 2017-10-31 SURGICAL SUPPLY — 17 items
BALLN LUTONIX 7X100X130 (BALLOONS) ×2
BALLN LUTONIX DCB 5X80X130 (BALLOONS) ×2
BALLN LUTONIX DCB 6X80X130 (BALLOONS) ×2
BALLOON LUTONIX 7X100X130 (BALLOONS) IMPLANT
BALLOON LUTONIX DCB 5X80X130 (BALLOONS) IMPLANT
BALLOON LUTONIX DCB 6X80X130 (BALLOONS) IMPLANT
CATH BEACON 5 .038 100 VERT TP (CATHETERS) ×1 IMPLANT
CATH PIG 70CM (CATHETERS) ×1 IMPLANT
DEVICE PRESTO INFLATION (MISCELLANEOUS) ×1 IMPLANT
DEVICE STARCLOSE SE CLOSURE (Vascular Products) ×1 IMPLANT
PACK ANGIOGRAPHY (CUSTOM PROCEDURE TRAY) ×2 IMPLANT
SHEATH ANL2 6FRX45 HC (SHEATH) ×1 IMPLANT
SHEATH BRITE TIP 5FRX11 (SHEATH) ×1 IMPLANT
SYR MEDRAD MARK V 150ML (SYRINGE) ×1 IMPLANT
TUBING CONTRAST HIGH PRESS 72 (TUBING) ×1 IMPLANT
WIRE J 3MM .035X145CM (WIRE) ×1 IMPLANT
WIRE MAGIC TORQUE 260C (WIRE) ×1 IMPLANT

## 2017-10-31 NOTE — Op Note (Signed)
[facility]  Percutaneous Study/Intervention Procedural Note   Date of Surgery: 10/31/2017  Surgeon(s):DEW,JASON    Assistants:none  Pre-operative Diagnosis: PAD with claudication bilateral lower extremities  Post-operative diagnosis:  Same  Procedure(s) Performed:             1.  Ultrasound guidance for vascular access left femoral artery             2.  Catheter placement into right SFA from left femoral approach             3.  Aortogram and selective right lower extremity angiogram             4.  Percutaneous transluminal angioplasty of right popliteal artery with 5 mm diameter and 6 mm diameter Lutonix drug-coated angioplasty balloons             5.   Percutaneous transluminal angioplasty of the right external iliac artery with 7 mm diameter Lutonix drug-coated angioplasty balloon  6.  StarClose closure device left femoral artery  EBL: 5 cc  Contrast: 50 cc  Fluoro Time: 3 minutes  Moderate Conscious Sedation Time: approximately 30 minutes using 4 mg of Versed and 100 Mcg of Fentanyl              Indications:  Patient is a 72 y.o.male with a known history of PAD with bilateral lower extremity claudication.  He has undergone left iliac stent placement with improvement in his left leg claudication.  He returns for treatment of his right leg. The patient is brought in for angiography for further evaluation and potential treatment. Risks and benefits are discussed and informed consent is obtained.   Procedure:  The patient was identified and appropriate procedural time out was performed.  The patient was then placed supine on the table and prepped and draped in the usual sterile fashion. Moderate conscious sedation was administered during a face to face encounter with the patient throughout the procedure with my supervision of the RN administering medicines and monitoring the patient's vital signs, pulse oximetry, telemetry and mental status throughout from  the start of the procedure until the patient was taken to the recovery room. Ultrasound was used to evaluate the left common femoral artery.  It was patent .  A digital ultrasound image was acquired.  A Seldinger needle was used to access the left common femoral artery under direct ultrasound guidance and a permanent image was performed.  A 0.035 J wire was advanced without resistance and a 5Fr sheath was placed.  Pigtail catheter was placed into the aorta and an AP aortogram was performed. The renal arteries appeared widely patent.  The previously placed left iliac stent was widely patent without significant recurrent stenosis.  The aorta was calcific but not stenotic.  The right iliac system was calcific and the area in the right external iliac artery demonstrated more degree of stenosis today than was appreciated previously once we used magnified images and oblique angles.  This appeared to be in the 50 to 60% range.  I then crossed the aortic bifurcation and advanced to the right proximal SFA. Selective right lower extremity angiogram was then performed. This demonstrated mild stenosis of the proximal SFA with a good profunda femoris artery.  The remainder of the SFA had diffuse calcific disease but no significant stenosis.  The popliteal artery above the knee had about a 70% stenosis focally at the level of the patella.  The vessel then normalized and there was  two-vessel runoff distally without focal stenosis. It was felt that it was in the patient's best interest to proceed with intervention after these images to avoid a second procedure and a larger amount of contrast and fluoroscopy based off of the findings from the initial angiogram. The patient was systemically heparinized and a 6 [place] Ansell sheath was then placed over the Magic tourque wire. I then used a Kumpe catheter and the Magic tourque wire to cross the popliteal artery stenosis.  I then treated this lesion initially with a 5 mm diameter by 8  cm length Lutonix drug-coated angioplasty balloon inflated to 12 atm for 1 minute.  Slightly undersized and I upsized to a 6 mm diameter by 8 cm length Lutonix drug-coated angioplasty balloon inflating this to 10 atm for 1 minute.  Completion imaging showed about a 25% residual stenosis and mild wall irregularity that was not flow-limiting.  I then pulled the sheath back to the right common iliac artery and address the external iliac artery lesion.  A 7 mm diameter by 10 cm length Lutonix drug-coated angioplasty balloon was inflated to 12 atm for 1 minute.  Completion imaging showed about a 15 to 20% residual stenosis which was not flow-limiting and this was best seen on a steep LAO projection. I elected to terminate the procedure. The sheath was removed and StarClose closure device was deployed in the left femoral artery with excellent hemostatic result. The patient was taken to the recovery room in stable condition having tolerated the procedure well.  Findings:               Aortogram:  The renal arteries appeared widely patent.  The previously placed left iliac stent was widely patent without significant recurrent stenosis.  The aorta was calcific but not stenotic.  The right iliac system was calcific and the area in the right external iliac artery demonstrated more degree of stenosis today than was appreciated previously once we used magnified images and oblique angles.  This appeared to be in the 50 to 60% range.             Right lower Extremity:  This demonstrated mild stenosis of the proximal SFA with a good profunda femoris artery.  The remainder of the SFA had diffuse calcific disease but no significant stenosis.  The popliteal artery above the knee had about a 70% stenosis focally at the level of the patella.  The vessel then normalized and there was two-vessel runoff distally without focal stenosis   Disposition: Patient was taken to the recovery room in stable condition having tolerated the  procedure well.  Complications: None  Todd Whitehead 10/31/2017 2:06 PM   This note was created with Dragon Medical transcription system. Any errors in dictation are purely unintentional.

## 2017-10-31 NOTE — H&P (Signed)
[facility] History & Physical Update  The patient was interviewed and re-examined.  The patient's previous History and Physical has been reviewed and is unchanged.  There is no change in the plan of care. We plan to proceed with the scheduled procedure.  Leotis Pain, MD  10/31/2017, 12:38 PM

## 2017-11-01 ENCOUNTER — Ambulatory Visit (INDEPENDENT_AMBULATORY_CARE_PROVIDER_SITE_OTHER): Payer: Medicare Other | Admitting: Nurse Practitioner

## 2017-11-01 ENCOUNTER — Encounter: Payer: Self-pay | Admitting: Vascular Surgery

## 2017-11-01 ENCOUNTER — Telehealth (INDEPENDENT_AMBULATORY_CARE_PROVIDER_SITE_OTHER): Payer: Self-pay

## 2017-11-01 VITALS — BP 132/68 | HR 64 | Resp 16 | Ht 68.0 in | Wt 121.6 lb

## 2017-11-01 DIAGNOSIS — I739 Peripheral vascular disease, unspecified: Secondary | ICD-10-CM

## 2017-11-01 DIAGNOSIS — F17210 Nicotine dependence, cigarettes, uncomplicated: Secondary | ICD-10-CM | POA: Diagnosis not present

## 2017-11-01 DIAGNOSIS — J449 Chronic obstructive pulmonary disease, unspecified: Secondary | ICD-10-CM | POA: Diagnosis not present

## 2017-11-01 DIAGNOSIS — I10 Essential (primary) hypertension: Secondary | ICD-10-CM

## 2017-11-01 NOTE — Progress Notes (Signed)
Subjective:    Patient ID: Todd Whitehead, male    DOB: August 26, 1945, 72 y.o.   MRN: [id number] Chief Complaint  Patient presents with  . Follow-up    bleeding for surgical site    HPI  Todd Whitehead is a 72 y.o. male that presents today for bleeding from his angiogram access site.  He underwent a right lower extremity angiogram with a left-sided access.  He reports that he has been soaking through gauze approximately every 30 minutes or so.  He states that this morning he woke up covered in blood from the area.  He denies any dizziness, lightheadedness, or syncope.  He reports that he has been holding pressure over the area especially when he sneezes or coughs however it is continued to bleed.  Upon assessment, there is a slight trickling ooze coming from the site.  There is not a copious bleed.  There is no bruising or hematoma present.  The patient denies pain at the access site, just soreness.  The current dressing on was saturated when he presented.  It appears he was oozing through his bandage and onto his underwear and jeans.  He denies any fever, chills, nausea, vomiting, diarrhea.  He denies any chest pain or shortness of breath.  Review of Systems   Review of Systems: Negative Unless Checked Constitutional: [] Weight loss  [] Fever  [] Chills Cardiac: [] Chest pain   ? Atrial Fibrillation  [] Palpitations   [] Shortness of breath when laying flat   [] Shortness of breath with exertion. Vascular:  [] Pain in legs with walking   [] Pain in legs with standing  [] History of DVT   [] Phlebitis   [] Swelling in legs   [] Varicose veins   [] Non-healing ulcers Pulmonary:   [] Uses home oxygen   [] Productive cough   [] Hemoptysis   [] Wheeze  [] COPD   [] Asthma Neurologic:  [] Dizziness   [] Seizures   [] History of stroke   [] History of TIA  [] Aphasia   [] Vissual changes   [] Weakness or numbness in arm   [] Weakness or numbness in leg Musculoskeletal:   [] Joint swelling   [] Joint pain   [] Low back pain  ?  History of Knee Replacement Hematologic:  [] Easy bruising  [x] Easy bleeding   [] Hypercoagulable state   [] Anemic Gastrointestinal:  [] Diarrhea   [] Vomiting  [] Gastroesophageal reflux/heartburn   [] Difficulty swallowing. Genitourinary:  [] Chronic kidney disease   [] Difficult urination  [] Anuric   [] Blood in urine Skin:  [] Rashes   [] Ulcers  Psychological:  [] History of anxiety   []  History of major depression  ? Memory Difficulties     Objective:   Physical Exam  BP 132/68 (BP Location: Right Arm)   Pulse 64   Resp 16   Ht 5\' 8"  (1.727 m)   Wt 121 lb 9.6 oz (55.2 kg)   BMI 18.49 kg/m   Past Medical History:  Diagnosis Date  . Anxiety   . Arthritis   . Colon polyp   . COPD (chronic obstructive pulmonary disease) ([place])    smoker  . Dysrhythmia   . Glaucoma   . Heart murmur   . Hypertension    on  no meds  . Melanoma ([place])    Resected from Left upper arm.   . Myocardial infarction ([place]) 2017  . Peripheral vascular disease ([place])   . Prostate enlargement      Gen: WD/WN, NAD Head: [place]/AT, No temporalis wasting.  Poor dentition Ear/Nose/Throat: Hearing grossly intact, nares w/o erythema or drainage Eyes: PER, EOMI,  sclera nonicteric.  Neck: Supple, no masses.  No JVD.  Pulmonary:  Good air movement, no use of accessory muscles.  Cardiac: RRR Vascular:  Small 1 mm oozing wound in the left groin. Vessel Right Left  Radial Palpable Palpable  Dorsalis Pedis Palpable Palpable  Posterior Tibial Palpable Palpable   Gastrointestinal: soft, non-distended. No guarding/no peritoneal signs.  Musculoskeletal: M/S 5/5 throughout.  No deformity or atrophy.  Neurologic: Pain and light touch intact in extremities.  Symmetrical.  Speech is fluent. Motor exam as listed above. Psychiatric: Judgment intact, Mood & affect appropriate for pt's clinical situation. Dermatologic: No Venous rashes. No Ulcers Noted.  No changes consistent with cellulitis. Lymph : No Cervical lymphadenopathy, no  lichenification or skin changes of chronic lymphedema.   Social History   Socioeconomic History  . Marital status: Divorced    Spouse name: Not on file  . Number of children: Not on file  . Years of education: Not on file  . Highest education level: Not on file  Occupational History  . Not on file  Social Needs  . Financial resource strain: Not on file  . Food insecurity:    Worry: Not on file    Inability: Not on file  . Transportation needs:    Medical: Not on file    Non-medical: Not on file  Tobacco Use  . Smoking status: Current Every Day Smoker    Packs/day: 1.50    Years: 58.00    Pack years: 87.00    Types: Cigarettes  . Smokeless tobacco: Never Used  Substance and Sexual Activity  . Alcohol use: No  . Drug use: No  . Sexual activity: Not Currently  Lifestyle  . Physical activity:    Days per week: Not on file    Minutes per session: Not on file  . Stress: Not on file  Relationships  . Social connections:    Talks on phone: Not on file    Gets together: Not on file    Attends religious service: Not on file    Active member of club or organization: Not on file    Attends meetings of clubs or organizations: Not on file    Relationship status: Not on file  . Intimate partner violence:    Fear of current or ex partner: Not on file    Emotionally abused: Not on file    Physically abused: Not on file    Forced sexual activity: Not on file  Other Topics Concern  . Not on file  Social History Narrative  . Not on file    Past Surgical History:  Procedure Laterality Date  . BACK SURGERY  01/19/98  . CARDIAC CATHETERIZATION N/A 08/19/2015   Procedure: Left Heart Cath and Coronary Angiography;  Surgeon: [name], MD;  Location: [facility];  Service: Cardiovascular;  Laterality: N/A;  . CHOLECYSTECTOMY N/A 08/19/2016   Procedure: LAPAROSCOPIC CHOLECYSTECTOMY WITH INTRAOPERATIVE CHOLANGIOGRAM;  Surgeon: [name], MD;  [facility];   Service: General;  Laterality: N/A;  . COLON SURGERY  age 11 days old   " bowel was twisted up"  . COLONOSCOPY WITH PROPOFOL N/A 03/19/2016   Procedure: COLONOSCOPY WITH PROPOFOL;  Surgeon: [name], MD;  Location: [facility]COPY;  Service: Endoscopy;  Laterality: N/A;  . CORONARY ARTERY BYPASS GRAFT  2017  . ERCP N/A 08/18/2016   Procedure: ENDOSCOPIC RETROGRADE CHOLANGIOPANCREATOGRAPHY (ERCP);  Surgeon: [name], MD;  Location: [place];  Service: Gastroenterology;  Laterality: N/A;  .  EYE SURGERY Left 03/26/11  . LOWER EXTREMITY ANGIOGRAPHY Left 10/03/2017   Procedure: LOWER EXTREMITY ANGIOGRAPHY;  Surgeon: [name], MD;  Location: [place] [facility];  Service: Cardiovascular;  Laterality: Left;  . LOWER EXTREMITY ANGIOGRAPHY Right 10/31/2017   Procedure: LOWER EXTREMITY ANGIOGRAPHY;  Surgeon: [name], MD;  Location: [place] [facility];  Service: Cardiovascular;  Laterality: Right;  . LUMBAR LAMINECTOMY/DECOMPRESSION MICRODISCECTOMY Right 10/02/2012   Procedure: Right Lumbar Five-Sacral One Lumbar laminotomy/Microdiskectomy;  Surgeon: [name], MD;  Location: [place] [facility];  Service: Neurosurgery;  Laterality: Right;  Right Lumbar Five-Sacral One Lumbar laminotomy/Microdiskectomy    Family History  Problem Relation Age of Onset  . Heart attack Father   . Aneurysm Brother   . Lung cancer Brother     Allergies  Allergen Reactions  . Other Other (See Comments)    Cheese,butter,sour cream,cottage cheese  . Poison Ivy Extract Rash  . Poison Oak Extract Rash       Assessment & Plan:   1. PVD (peripheral vascular disease) with claudication ([place]) Today the patient presented with a slightly oozing access site following angiogram on 10/31/2017.  Following assessment of the wound, I held occlusive pressure for approximately 5 minutes at which point the wound has stopped trickling.  I then applied a firm pressure dressing using gauze and Coban  to apply venous  pressure.  The patient presented with his ex-wife, I gave them instructions to monitor the wound area.  A let them know to leave the dressing on until Monday.  I instructed them to leave it in place unless the dressing became saturated with blood, in which case I gave them supplies to change the dressing.  I advised the patient to contact us on Monday to let us know if the oozing has stopped, continued, or worsened.  He was instructed to visit the emergency room if the bleeding became excessive and uncontrollable, or he began to feel diaphoretic, lightheaded, dizzy or have syncopal symptoms.  The patient will also follow-up on his regularly scheduled follow-up visit.  2. Essential hypertension Continue antihypertensive medications as already ordered, these medications have been reviewed and there are no changes at this time.   3. COPD (chronic obstructive pulmonary disease) with chronic bronchitis (HCC) Continue pulmonary medications and aerosols as already ordered, these medications have been reviewed and there are no changes at this time.     Current Outpatient Medications on File Prior to Visit  Medication Sig Dispense Refill  . acetaminophen (TYLENOL) 500 MG tablet Take 1 tablet (500 mg total) by mouth every 6 (six) hours as needed. (Patient taking differently: Take 500 mg by mouth every 6 (six) hours as needed for moderate pain or headache. ) 30 tablet 0  . ALPRAZolam (XANAX) 1 MG tablet Take 1 mg by mouth 3 (three) times daily.     Marland Kitchen aspirin EC 81 MG tablet Take 81 mg by mouth daily.    Marland Kitchen atorvastatin (LIPITOR) 40 MG tablet Take 40 mg by mouth daily.     . B Complex Vitamins (VITAMIN B COMPLEX PO) Take 1 tablet by mouth daily.     . brimonidine (ALPHAGAN) 0.2 % ophthalmic solution Place 1 drop into both eyes every morning.     . busPIRone (BUSPAR) 30 MG tablet Take 30 mg by mouth 2 (two) times daily.    . citalopram (CELEXA) 20 MG tablet Take 20 mg by mouth daily.     . clopidogrel  (PLAVIX) 75 MG  tablet Take 1 tablet (75 mg total) by mouth daily. 30 tablet 11  . ferrous sulfate 324 (65 Fe) MG TBEC Take 324 mg by mouth daily.     Marland Kitchen latanoprost (XALATAN) 0.005 % ophthalmic solution Place 1 drop into both eyes at bedtime.   2  . metoprolol succinate (TOPROL XL) 25 MG 24 hr tablet Take 12.5 mg by mouth daily.    . nicotine polacrilex (COMMIT) 4 MG lozenge Take 4 mg by mouth as needed for smoking cessation.    . tamsulosin (FLOMAX) 0.4 MG CAPS capsule Take 1 capsule (0.4 mg total) by mouth daily after breakfast. 30 capsule 2  . traMADol (ULTRAM) 50 MG tablet Take 50 mg by mouth 2 (two) times daily.   2   No current facility-administered medications on file prior to visit.     There are no Patient Instructions on file for this visit. No follow-ups on file.   Kris Hartmann, NP

## 2017-11-01 NOTE — Telephone Encounter (Signed)
Patient stated that his dressing was filled with blood and has been doing dressing changes every 30 minutes.I inform Todd Medici NP and she want the patient to come in the office today.The patient is schedule to come in but he stated that he has to get a ride and I inform him if he is not able to get a ride for him to call the office.

## 2017-11-01 NOTE — Telephone Encounter (Signed)
Find out how much blood.  If its just a small amount on the bandage, then he should be ok with continuing to hold pressure.  If it is a larger amount, we can see him in office today.  Just find a spot for him.

## 2017-11-08 ENCOUNTER — Ambulatory Visit (INDEPENDENT_AMBULATORY_CARE_PROVIDER_SITE_OTHER): Payer: Medicare Other | Admitting: Vascular Surgery

## 2017-11-08 ENCOUNTER — Other Ambulatory Visit: Payer: Self-pay

## 2017-11-08 ENCOUNTER — Encounter (INDEPENDENT_AMBULATORY_CARE_PROVIDER_SITE_OTHER): Payer: Medicare Other

## 2017-11-08 ENCOUNTER — Telehealth (INDEPENDENT_AMBULATORY_CARE_PROVIDER_SITE_OTHER): Payer: Self-pay

## 2017-11-08 ENCOUNTER — Encounter: Payer: Self-pay | Admitting: Emergency Medicine

## 2017-11-08 ENCOUNTER — Observation Stay
Admission: EM | Admit: 2017-11-08 | Discharge: 2017-11-09 | Disposition: A | Discharge status: Home / Self Care | Payer: Medicare Other | Attending: Internal Medicine | Admitting: Internal Medicine

## 2017-11-08 DIAGNOSIS — H409 Unspecified glaucoma: Secondary | ICD-10-CM | POA: Insufficient documentation

## 2017-11-08 DIAGNOSIS — I10 Essential (primary) hypertension: Secondary | ICD-10-CM | POA: Diagnosis not present

## 2017-11-08 DIAGNOSIS — I739 Peripheral vascular disease, unspecified: Secondary | ICD-10-CM | POA: Insufficient documentation

## 2017-11-08 DIAGNOSIS — F17210 Nicotine dependence, cigarettes, uncomplicated: Secondary | ICD-10-CM | POA: Insufficient documentation

## 2017-11-08 DIAGNOSIS — F419 Anxiety disorder, unspecified: Secondary | ICD-10-CM | POA: Insufficient documentation

## 2017-11-08 DIAGNOSIS — I252 Old myocardial infarction: Secondary | ICD-10-CM | POA: Insufficient documentation

## 2017-11-08 DIAGNOSIS — Z951 Presence of aortocoronary bypass graft: Secondary | ICD-10-CM | POA: Insufficient documentation

## 2017-11-08 DIAGNOSIS — Z79899 Other long term (current) drug therapy: Secondary | ICD-10-CM | POA: Diagnosis not present

## 2017-11-08 DIAGNOSIS — Z23 Encounter for immunization: Secondary | ICD-10-CM | POA: Insufficient documentation

## 2017-11-08 DIAGNOSIS — R040 Epistaxis: Secondary | ICD-10-CM | POA: Diagnosis present

## 2017-11-08 DIAGNOSIS — J449 Chronic obstructive pulmonary disease, unspecified: Secondary | ICD-10-CM | POA: Diagnosis not present

## 2017-11-08 DIAGNOSIS — N400 Enlarged prostate without lower urinary tract symptoms: Secondary | ICD-10-CM | POA: Diagnosis not present

## 2017-11-08 DIAGNOSIS — Z7902 Long term (current) use of antithrombotics/antiplatelets: Secondary | ICD-10-CM | POA: Diagnosis not present

## 2017-11-08 DIAGNOSIS — Z85820 Personal history of malignant melanoma of skin: Secondary | ICD-10-CM | POA: Insufficient documentation

## 2017-11-08 DIAGNOSIS — Z79891 Long term (current) use of opiate analgesic: Secondary | ICD-10-CM | POA: Diagnosis not present

## 2017-11-08 DIAGNOSIS — Z7982 Long term (current) use of aspirin: Secondary | ICD-10-CM | POA: Insufficient documentation

## 2017-11-08 LAB — CBC WITH DIFFERENTIAL/PLATELET
Abs Immature Granulocytes: 0.02 10*3/uL (ref 0.00–0.07)
BASOS PCT: 1 %
Basophils Absolute: 0.1 10*3/uL (ref 0.0–0.1)
EOS ABS: 0.1 10*3/uL (ref 0.0–0.5)
EOS PCT: 2 %
HEMATOCRIT: 33 % — AB (ref 39.0–52.0)
Hemoglobin: 11.4 g/dL — ABNORMAL LOW (ref 13.0–17.0)
IMMATURE GRANULOCYTES: 0 %
LYMPHS ABS: 1.8 10*3/uL (ref 0.7–4.0)
Lymphocytes Relative: 29 %
MCH: 31.4 pg (ref 26.0–34.0)
MCHC: 34.5 g/dL (ref 30.0–36.0)
MCV: 90.9 fL (ref 80.0–100.0)
MONO ABS: 0.5 10*3/uL (ref 0.1–1.0)
Monocytes Relative: 8 %
Neutro Abs: 3.7 10*3/uL (ref 1.7–7.7)
Neutrophils Relative %: 60 %
PLATELETS: 173 10*3/uL (ref 150–400)
RBC: 3.63 MIL/uL — ABNORMAL LOW (ref 4.22–5.81)
RDW: 14 % (ref 11.5–15.5)
WBC: 6.1 10*3/uL (ref 4.0–10.5)
nRBC: 0 % (ref 0.0–0.2)

## 2017-11-08 LAB — BASIC METABOLIC PANEL
Anion gap: 5 (ref 5–15)
BUN: 15 mg/dL (ref 8–23)
CALCIUM: 9 mg/dL (ref 8.9–10.3)
CO2: 26 mmol/L (ref 22–32)
CREATININE: 0.84 mg/dL (ref 0.61–1.24)
Chloride: 104 mmol/L (ref 98–111)
GFR calc Af Amer: >60 mL/min (ref >60)
GLUCOSE: 95 mg/dL (ref 70–99)
Potassium: 3.9 mmol/L (ref 3.5–5.1)
Sodium: 135 mmol/L (ref 135–145)

## 2017-11-08 LAB — PROTIME-INR
INR: 0.97
Prothrombin Time: 12.8 seconds (ref 11.4–15.2)

## 2017-11-08 MED ORDER — BRIMONIDINE TARTRATE 0.2 % OP SOLN
1.0000 [drp] | Freq: Every morning | OPHTHALMIC | Status: DC
  Administered 2017-11-09: 1 [drp] via OPHTHALMIC
  Filled 2017-11-08: qty 5

## 2017-11-08 MED ORDER — ONDANSETRON HCL 4 MG/2ML IJ SOLN: 4.0000 mg | Freq: Four times a day (QID) | INTRAMUSCULAR | Status: DC | PRN

## 2017-11-08 MED ORDER — NICOTINE POLACRILEX 2 MG MT GUM
4.0000 mg | CHEWING_GUM | OROMUCOSAL | Status: DC | PRN
  Filled 2017-11-08: qty 2

## 2017-11-08 MED ORDER — LATANOPROST 0.005 % OP SOLN
1.0000 [drp] | Freq: Every day | OPHTHALMIC | Status: DC
  Administered 2017-11-08: 1 [drp] via OPHTHALMIC
  Filled 2017-11-08: qty 2.5

## 2017-11-08 MED ORDER — OXYMETAZOLINE HCL 0.05 % NA SOLN
2.0000 | Freq: Once | NASAL | Status: AC
  Administered 2017-11-08: 2 via NASAL

## 2017-11-08 MED ORDER — ACETAMINOPHEN 325 MG PO TABS: 650.0000 mg | ORAL_TABLET | Freq: Four times a day (QID) | ORAL | Status: DC | PRN

## 2017-11-08 MED ORDER — MORPHINE SULFATE (PF) 4 MG/ML IV SOLN
4.0000 mg | Freq: Once | INTRAVENOUS | Status: AC
  Administered 2017-11-08: 4 mg via INTRAVENOUS
  Filled 2017-11-08: qty 1

## 2017-11-08 MED ORDER — HYDROCODONE-ACETAMINOPHEN 5-325 MG PO TABS
1.0000 | ORAL_TABLET | ORAL | Status: DC | PRN
  Administered 2017-11-08: 2 via ORAL
  Filled 2017-11-08: qty 2

## 2017-11-08 MED ORDER — ALPRAZOLAM 0.5 MG PO TABS
1.0000 mg | ORAL_TABLET | Freq: Three times a day (TID) | ORAL | Status: DC
  Administered 2017-11-08 – 2017-11-09 (×2): 1 mg via ORAL
  Filled 2017-11-08 (×2): qty 2

## 2017-11-08 MED ORDER — TRAZODONE HCL 50 MG PO TABS: 25.0000 mg | ORAL_TABLET | Freq: Every evening | ORAL | Status: DC | PRN

## 2017-11-08 MED ORDER — TRAMADOL HCL 50 MG PO TABS
50.0000 mg | ORAL_TABLET | Freq: Two times a day (BID) | ORAL | Status: DC
  Administered 2017-11-08 – 2017-11-09 (×2): 50 mg via ORAL
  Filled 2017-11-08 (×2): qty 1

## 2017-11-08 MED ORDER — FERROUS SULFATE 325 (65 FE) MG PO TABS
324.0000 mg | ORAL_TABLET | Freq: Every day | ORAL | Status: DC
  Filled 2017-11-08: qty 1

## 2017-11-08 MED ORDER — ONDANSETRON HCL 4 MG/2ML IJ SOLN
4.0000 mg | Freq: Once | INTRAMUSCULAR | Status: AC
  Administered 2017-11-08: 4 mg via INTRAVENOUS
  Filled 2017-11-08: qty 2

## 2017-11-08 MED ORDER — OXYMETAZOLINE HCL 0.05 % NA SOLN
NASAL | Status: AC
  Administered 2017-11-08: 2 via NASAL
  Filled 2017-11-08: qty 15

## 2017-11-08 MED ORDER — B COMPLEX-C PO TABS
1.0000 | ORAL_TABLET | Freq: Every day | ORAL | Status: DC
  Administered 2017-11-09: 1 via ORAL
  Filled 2017-11-08: qty 1

## 2017-11-08 MED ORDER — INFLUENZA VAC SPLIT HIGH-DOSE 0.5 ML IM SUSY
0.5000 mL | PREFILLED_SYRINGE | INTRAMUSCULAR | Status: AC
  Administered 2017-11-09: 0.5 mL via INTRAMUSCULAR
  Filled 2017-11-08 (×2): qty 0.5

## 2017-11-08 MED ORDER — BISACODYL 5 MG PO TBEC: 5.0000 mg | DELAYED_RELEASE_TABLET | Freq: Every day | ORAL | Status: DC | PRN

## 2017-11-08 MED ORDER — SODIUM CHLORIDE 0.9 % IV SOLN
Freq: Once | INTRAVENOUS | Status: AC
  Administered 2017-11-08: 23:00:00 via INTRAVENOUS

## 2017-11-08 MED ORDER — DOCUSATE SODIUM 100 MG PO CAPS
100.0000 mg | ORAL_CAPSULE | Freq: Two times a day (BID) | ORAL | Status: DC
  Administered 2017-11-08 – 2017-11-09 (×2): 100 mg via ORAL
  Filled 2017-11-08 (×2): qty 1

## 2017-11-08 MED ORDER — BUSPIRONE HCL 15 MG PO TABS
30.0000 mg | ORAL_TABLET | Freq: Two times a day (BID) | ORAL | Status: DC
  Administered 2017-11-08 – 2017-11-09 (×2): 30 mg via ORAL
  Filled 2017-11-08 (×3): qty 2

## 2017-11-08 MED ORDER — VITAMIN B COMPLEX PO TABS: ORAL_TABLET | Freq: Every day | ORAL | Status: DC

## 2017-11-08 MED ORDER — ONDANSETRON HCL 4 MG PO TABS: 4.0000 mg | ORAL_TABLET | Freq: Four times a day (QID) | ORAL | Status: DC | PRN

## 2017-11-08 MED ORDER — ACETAMINOPHEN 650 MG RE SUPP: 650.0000 mg | Freq: Four times a day (QID) | RECTAL | Status: DC | PRN

## 2017-11-08 MED ORDER — CITALOPRAM HYDROBROMIDE 20 MG PO TABS
20.0000 mg | ORAL_TABLET | Freq: Every day | ORAL | Status: DC
  Administered 2017-11-09: 20 mg via ORAL
  Filled 2017-11-08: qty 1

## 2017-11-08 MED ORDER — ATORVASTATIN CALCIUM 20 MG PO TABS: 40.0000 mg | ORAL_TABLET | Freq: Every day | ORAL | Status: DC

## 2017-11-08 MED ORDER — METOPROLOL SUCCINATE ER 25 MG PO TB24
12.5000 mg | ORAL_TABLET | Freq: Every day | ORAL | Status: DC
  Administered 2017-11-09: 12.5 mg via ORAL
  Filled 2017-11-08: qty 1

## 2017-11-08 NOTE — ED Notes (Signed)
Admitting MD at bedside.

## 2017-11-08 NOTE — ED Triage Notes (Signed)
Patient presents to the ED with a nosebleed post blowing his nose around 4:45pm.  Nose continues to bleed.  Patient spitting blood.  Patient takes plavix and aspirin.  Patient has been taking plavix x 1 month.

## 2017-11-08 NOTE — ED Provider Notes (Signed)
[facility] Emergency Department Provider Note ____________________________________________   First MD Initiated Contact with Patient 11/08/17 1826     (approximate)  I have reviewed the triage vital signs and the nursing notes.   HISTORY  Chief Complaint Epistaxis    HPI Todd Whitehead is a 72 y.o. male with PMH as noted below and who is on aspirin and Plavix who presents with epistaxis, acute onset approximately 1.5 hours before he came to the ED.  It appears to be more from the right side although he is not sure.  He is spitting up some blood that goes down his throat.  He denies feeling dizzy or lightheaded.  No prior history of epistaxis.  He stated it started when he blew his nose.   Past Medical History:  Diagnosis Date  . Anxiety   . Arthritis   . Colon polyp   . COPD (chronic obstructive pulmonary disease) ([place])    smoker  . Dysrhythmia   . Glaucoma   . Heart murmur   . Hypertension    on  no meds  . Melanoma ([place])    Resected from Left upper arm.   . Myocardial infarction ([employer]) 2017  . Peripheral vascular disease ([place])   . Prostate enlargement     Patient Active Problem List   Diagnosis Date Noted  . Epistaxis 11/08/2017  . PVD (peripheral vascular disease) with claudication ([place]) 09/28/2017  . Hypertension 06/25/2017  . Abdominal pain 06/25/2017  . Aortic atherosclerosis ([place]) 06/25/2017  . Bile duct calculus with acute cholecystitis 08/17/2016  . Acute cholecystitis   . Personal history of tobacco use, presenting hazards to health 07/08/2016  . COPD (chronic obstructive pulmonary disease) with chronic bronchitis ([place]) 10/04/2015    Past Surgical History:  Procedure Laterality Date  . BACK SURGERY  01/19/98  . CARDIAC CATHETERIZATION N/A 08/19/2015   Procedure: Left Heart Cath and Coronary Angiography;  Surgeon: [name], MD;  Location: [place] [facility];  Service: Cardiovascular;  Laterality: N/A;  .  CHOLECYSTECTOMY N/A 08/19/2016   Procedure: LAPAROSCOPIC CHOLECYSTECTOMY WITH INTRAOPERATIVE CHOLANGIOGRAM;  Surgeon: [name], MD;  [facility];  Service: General;  Laterality: N/A;  . COLON SURGERY  age 36 days old   " bowel was twisted up"  . COLONOSCOPY WITH PROPOFOL N/A 03/19/2016   Procedure: COLONOSCOPY WITH PROPOFOL;  Surgeon: [name], MD;  Location: [facility] ENDOSCOPY;  Service: Endoscopy;  Laterality: N/A;  . CORONARY ARTERY BYPASS GRAFT  2017  . ERCP N/A 08/18/2016   Procedure: ENDOSCOPIC RETROGRADE CHOLANGIOPANCREATOGRAPHY (ERCP);  Surgeon: [name], MD;  Location: [place];  Service: Gastroenterology;  Laterality: N/A;  . EYE SURGERY Left 03/26/11  . LOWER EXTREMITY ANGIOGRAPHY Left 10/03/2017   Procedure: LOWER EXTREMITY ANGIOGRAPHY;  Surgeon: [name], MD;  Location: [place] [facility];  Service: Cardiovascular;  Laterality: Left;  . LOWER EXTREMITY ANGIOGRAPHY Right 10/31/2017   Procedure: LOWER EXTREMITY ANGIOGRAPHY;  Surgeon: [name], MD;  Location: [place] [facility];  Service: Cardiovascular;  Laterality: Right;  . LUMBAR LAMINECTOMY/DECOMPRESSION MICRODISCECTOMY Right 10/02/2012   Procedure: Right Lumbar Five-Sacral One Lumbar laminotomy/Microdiskectomy;  Surgeon: [name], MD;  Location: [place] [facility];  Service: Neurosurgery;  Laterality: Right;  Right Lumbar Five-Sacral One Lumbar laminotomy/Microdiskectomy    Prior to Admission medications   Medication Sig Start Date End Date Taking? Authorizing Provider  acetaminophen (TYLENOL) 500 MG tablet Take 1 tablet (500 mg total) by mouth every 6 (six)  hours as needed. Patient taking differently: Take 500 mg by mouth every 6 (six) hours as needed for moderate pain or headache.  08/20/16  Yes Waldron Session, MD  ALPRAZolam Duanne Moron) 1 MG tablet Take 1 mg by mouth 3 (three) times daily.    Yes [provider]  aspirin EC 81 MG tablet Take 81 mg by mouth daily.   Yes [provider]    atorvastatin (LIPITOR) 40 MG tablet Take 40 mg by mouth daily.  05/22/17  Yes [provider]  B Complex Vitamins (VITAMIN B COMPLEX PO) Take 1 tablet by mouth daily.    Yes [provider]  brimonidine (ALPHAGAN) 0.2 % ophthalmic solution Place 1 drop into both eyes every morning.  02/14/16  Yes [provider]  busPIRone (BUSPAR) 30 MG tablet Take 30 mg by mouth 2 (two) times daily.   Yes [provider]  citalopram (CELEXA) 20 MG tablet Take 20 mg by mouth daily.  05/22/17 05/22/18 Yes [provider]  clopidogrel (PLAVIX) 75 MG tablet Take 1 tablet (75 mg total) by mouth daily. 10/03/17  Yes Dew, Erskine Squibb, MD  ferrous sulfate 324 (65 Fe) MG TBEC Take 324 mg by mouth daily.  09/17/15  Yes [provider]  latanoprost (XALATAN) 0.005 % ophthalmic solution Place 1 drop into both eyes at bedtime.  06/14/17  Yes [provider]  metoprolol succinate (TOPROL XL) 25 MG 24 hr tablet Take 12.5 mg by mouth daily.   Yes [provider]  nicotine polacrilex (COMMIT) 4 MG lozenge Take 4 mg by mouth as needed for smoking cessation.   Yes [provider]  tamsulosin (FLOMAX) 0.4 MG CAPS capsule Take 1 capsule (0.4 mg total) by mouth daily after breakfast. 08/20/16  Yes Waldron Session, MD  traMADol (ULTRAM) 50 MG tablet Take 50 mg by mouth 2 (two) times daily.  09/25/17  Yes [provider]    Allergies Other; Poison ivy extract; and Poison oak extract  Family History  Problem Relation Age of Onset  . Heart attack Father   . Aneurysm Brother   . Lung cancer Brother     Social History Social History   Tobacco Use  . Smoking status: Current Every Day Smoker    Packs/day: 1.50    Years: 58.00    Pack years: 87.00    Types: Cigarettes  . Smokeless tobacco: Never Used  Substance Use Topics  . Alcohol use: No  . Drug use: No    Review of Systems  Constitutional: No fever. Eyes: No redness. ENT: Positive for  epistaxis. Cardiovascular: Denies chest pain. Respiratory: Denies shortness of breath. Gastrointestinal: No vomiting.  Genitourinary: Negative for flank pain.  Musculoskeletal: Negative for back pain. Skin: Negative for rash. Neurological: Negative for headache.   ____________________________________________   PHYSICAL EXAM:  VITAL SIGNS: ED Triage Vitals  Enc Vitals Group     BP 11/08/17 1808 130/67     Pulse Rate 11/08/17 1808 70     Resp 11/08/17 1808 18     Temp 11/08/17 1808 97.7 F (36.5 C)     Temp Source 11/08/17 1808 Oral     SpO2 11/08/17 1808 99 %     Weight 11/08/17 1809 123 lb (55.8 kg)     Height 11/08/17 1809 5\' 8"  (1.727 m)     Head Circumference --      Peak Flow --      Pain Score 11/08/17 1809 0     Pain  Loc --      Pain Edu? --      Excl. in IXL? --     Constitutional: Alert and oriented.  Uncomfortable appearing but in no acute distress. Eyes: Conjunctivae are normal.  Head: Atraumatic. Nose: Right-sided epistaxis.  Unable to clearly visualize source due to clotted blood. Mouth/Throat: Mucous membranes are moist.  Oropharynx clear. Neck: Normal range of motion.  Cardiovascular: Normal rate, regular rhythm. Grossly normal heart sounds.  Good peripheral circulation. Respiratory: Normal respiratory effort.  No retractions. Lungs CTAB. Gastrointestinal: No distention.  Musculoskeletal: Extremities warm and well perfused.  Neurologic:  Normal speech and language. No gross focal neurologic deficits are appreciated.  Skin:  Skin is warm and dry. No rash noted. Psychiatric: Mood and affect are normal. Speech and behavior are normal.  ____________________________________________   LABS (all labs ordered are listed, but only abnormal results are displayed)  Labs Reviewed  CBC WITH DIFFERENTIAL/PLATELET - Abnormal; Notable for the following components:      Result Value   RBC 3.63 (*)    Hemoglobin 11.4 (*)    HCT 33.0 (*)    All other components  within normal limits  BASIC METABOLIC PANEL  PROTIME-INR   ____________________________________________  EKG   ____________________________________________  RADIOLOGY    ____________________________________________   PROCEDURES  Procedure(s) performed: Yes   .Epistaxis Management Date/Time: 11/08/2017 10:05 PM Performed by: Arta Silence, MD Authorized by: Arta Silence, MD   Consent:    Consent obtained:  Verbal   Consent given by:  Patient   Risks discussed:  Infection Anesthesia (see MAR for exact dosages):    Anesthesia method:  None Procedure details:    Treatment site:  L anterior and R anterior   Treatment method:  Merocel sponge   Treatment complexity:  Limited   Treatment episode: initial   Post-procedure details:    Assessment:  Bleeding decreased   Patient tolerance of procedure:  Tolerated well, no immediate complications    Critical Care performed: No ____________________________________________   INITIAL IMPRESSION / ASSESSMENT AND PLAN / ED COURSE  Pertinent labs & imaging results that were available during my care of the patient were reviewed by me and considered in my medical decision making (see chart for details).  72 year old male on aspirin and Plavix presents with epistaxis, acute onset when he blew his nose.  It has not responded so far to Afrin or direct pressure.  On exam, the patient is otherwise relatively well appearing and his vital signs are normal.  I suspect most likely anterior epistaxis.  Since direct pressure and Afrin have not worked, I will pack the right nare and reassess.  ----------------------------------------- 10:06 PM on 11/08/2017 -----------------------------------------  The right nare was packed with Merocel.  Although the bleeding seemed to improve, the patient started to have significant bleeding out of the left nare.  I then placed packing in the left nare.  The patient was placed on a  cardiac monitor.  He required morphine and Zofran to manage his discomfort.  He was able to tolerate the procedure.  After the bilateral Merocel was placed, the patient continued to have some small oozing around the packing and was still spitting up blood.  I consulted Dr. Kathyrn Sheriff from ENT.  He advised that the packing should be left in place (rather than replaced with Rhino Rocket or other device) and that the bleeding should likely gradually subside.  He recommended follow-up for removal in 5 days, and advised that if the patient required  admission due to his discomfort this would be appropriate as well.  On reassessment, the oozing has significantly decreased and the patient is now no longer coughing up much blood.  However he still remains very uncomfortable and given his age and comorbidities and the presence of bilateral packing I feel that it would be safest to admit him for observation tonight.  The patient strongly would prefer to stay in the hospital.  I signed the patient out to the hospitalist Dr. Duane Boston.   ____________________________________________   FINAL CLINICAL IMPRESSION(S) / ED DIAGNOSES  Final diagnoses:  Epistaxis      NEW MEDICATIONS STARTED DURING THIS VISIT:  New Prescriptions   No medications on file     Note:  This document was prepared using Dragon voice recognition software and may include unintentional dictation errors.    Arta Silence, MD 11/08/17 2208

## 2017-11-08 NOTE — ED Notes (Signed)
After giving the afrin and readjusting clamp, bleeding slowed

## 2017-11-08 NOTE — Telephone Encounter (Signed)
Patient called  in with the complaint of a knot being at the incision site at his groin from his leg angio on 10/31/17

## 2017-11-08 NOTE — ED Notes (Signed)
Merocel placed with EDP on right side. Left side remains bleeding.

## 2017-11-08 NOTE — ED Notes (Signed)
EDP placed Merocel in right side of nose. Pt remains swallowing and spitting up blood.

## 2017-11-08 NOTE — Telephone Encounter (Signed)
Spoke with Dr. Lucky Cowboy and per his recommendation the patient will be seen on 11/13/17 @ 3:00 for a LE arterial ultrasound. Patient has been informed of the upcoming appt.

## 2017-11-08 NOTE — ED Notes (Signed)
Pt's nose continues to bleed, though is slightly lessened.

## 2017-11-08 NOTE — H&P (Signed)
[facility] [place] NAME: Todd Whitehead    MR#:  [id number]  DATE OF BIRTH:  1945/05/01  DATE OF ADMISSION:  11/08/2017  PRIMARY CARE PHYSICIAN: [name], MD   REQUESTING/REFERRING PHYSICIAN:   CHIEF COMPLAINT:   Chief Complaint  Patient presents with  . Epistaxis    HISTORY OF PRESENT ILLNESS: Todd Whitehead  is a 72 y.o. male with a known history of tobacco abuse, COPD, hypertension, CAD, PAD and other comorbidities. Patient presented to emergency room for nosebleed going on for the past 1 hour and a half before coming to the hospital.  I started when he blew his nose.  He tried to apply pressure to the right side which seems to be the side there is bleeding, without any improvement.  He denies any recent trauma.  No prior similar episodes.  Patient is on aspirin and Plavix. Blood test done emergency room, including CBC and CMP are grossly unremarkable, except for slightly low hemoglobin level at 11.4. Packing to both nares was done in the emergency room.  The bleeding has improved a lot but not completely resolved. Per ENT, patient is to keep the packing for 5 days and follow-up for removal at that time.  Patient seems to be drowsy due to medications and uncomfortable due to bleeding and the packing and he would feel safer if he would stay for observation overnight.   PAST MEDICAL HISTORY:   Past Medical History:  Diagnosis Date  . Anxiety   . Arthritis   . Colon polyp   . COPD (chronic obstructive pulmonary disease) ([place])    smoker  . Dysrhythmia   . Glaucoma   . Heart murmur   . Hypertension    on  no meds  . Melanoma ([place])    Resected from Left upper arm.   . Myocardial infarction ([place]) 2017  . Peripheral vascular disease ([place])   . Prostate enlargement     PAST SURGICAL HISTORY:  Past Surgical History:  Procedure Laterality Date  . BACK SURGERY  01/19/98  . CARDIAC CATHETERIZATION N/A 08/19/2015   Procedure:  Left Heart Cath and Coronary Angiography;  Surgeon: [name], MD;  Location: [facility];  Service: Cardiovascular;  Laterality: N/A;  . CHOLECYSTECTOMY N/A 08/19/2016   Procedure: LAPAROSCOPIC CHOLECYSTECTOMY WITH INTRAOPERATIVE CHOLANGIOGRAM;  Surgeon: [name], MD;  Location: [facility];  Service: General;  Laterality: N/A;  . COLON SURGERY  age 72 days old   " bowel was twisted up"  . COLONOSCOPY WITH PROPOFOL N/A 03/19/2016   Procedure: COLONOSCOPY WITH PROPOFOL;  Surgeon: [name], MD;  Location: [facility];  Service: Endoscopy;  Laterality: N/A;  . CORONARY ARTERY BYPASS GRAFT  2017  . ERCP N/A 08/18/2016   Procedure: ENDOSCOPIC RETROGRADE CHOLANGIOPANCREATOGRAPHY (ERCP);  Surgeon: [name], MD;  Location: [facility];  Service: Gastroenterology;  Laterality: N/A;  . EYE SURGERY Left 03/26/11  . LOWER EXTREMITY ANGIOGRAPHY Left 10/03/2017   Procedure: LOWER EXTREMITY ANGIOGRAPHY;  Surgeon: [name], MD;  Location: [facility];  Service: Cardiovascular;  Laterality: Left;  . LOWER EXTREMITY ANGIOGRAPHY Right 10/31/2017   Procedure: LOWER EXTREMITY ANGIOGRAPHY;  Surgeon: [name], MD;  Location: [facility];  Service: Cardiovascular;  Laterality: Right;  . LUMBAR LAMINECTOMY/DECOMPRESSION MICRODISCECTOMY Right 10/02/2012   Procedure: Right Lumbar Five-Sacral One Lumbar laminotomy/Microdiskectomy;  Surgeon: [name], MD;  Location: [facility];  Service: Neurosurgery;  Laterality: Right;  Right Lumbar Five-Sacral One Lumbar laminotomy/Microdiskectomy    SOCIAL HISTORY:  Social History   Tobacco Use  . Smoking status: Current Every Day Smoker    Packs/day: 1.50    Years: 58.00    Pack years: 87.00    Types: Cigarettes  . Smokeless tobacco: Never Used  Substance Use Topics  . Alcohol use: No    FAMILY HISTORY:  Family History  Problem Relation Age of Onset  . Heart attack Father   . Aneurysm Brother   . Lung cancer  Brother     DRUG ALLERGIES:  Allergies  Allergen Reactions  . Other Other (See Comments)    Cheese,butter,sour cream,cottage cheese  . Poison Ivy Extract Rash  . Poison Oak Extract Rash    REVIEW OF SYSTEMS:   CONSTITUTIONAL: No fever, fatigue or weakness.  EYES: No changes in vision.  EARS, NOSE, AND THROAT: Positive for nosebleed.  No tinnitus or ear pain.  RESPIRATORY: No cough, shortness of breath, wheezing or hemoptysis.  CARDIOVASCULAR: No chest pain, orthopnea, edema.  GASTROINTESTINAL: No nausea, vomiting, diarrhea or abdominal pain.  GENITOURINARY: No dysuria, hematuria.  ENDOCRINE: No polyuria, nocturia. HEMATOLOGY: No bleeding. SKIN: No rash or lesion. MUSCULOSKELETAL: No joint pain at this time.   NEUROLOGIC: No focal weakness.  PSYCHIATRY: No anxiety or depression.   MEDICATIONS AT HOME:  Prior to Admission medications   Medication Sig Start Date End Date Taking? Authorizing Provider  acetaminophen (TYLENOL) 500 MG tablet Take 1 tablet (500 mg total) by mouth every 6 (six) hours as needed. Patient taking differently: Take 500 mg by mouth every 6 (six) hours as needed for moderate pain or headache.  08/20/16   Waldron Session, MD  ALPRAZolam Duanne Moron) 1 MG tablet Take 1 mg by mouth 3 (three) times daily.     [provider]  aspirin EC 81 MG tablet Take 81 mg by mouth daily.    [provider]  atorvastatin (LIPITOR) 40 MG tablet Take 40 mg by mouth daily.  05/22/17   [provider]  B Complex Vitamins (VITAMIN B COMPLEX PO) Take 1 tablet by mouth daily.     [provider]  brimonidine (ALPHAGAN) 0.2 % ophthalmic solution Place 1 drop into both eyes every morning.  02/14/16   [provider]  busPIRone (BUSPAR) 30 MG tablet Take 30 mg by mouth 2 (two) times daily.    [provider]  citalopram (CELEXA) 20 MG tablet Take 20 mg by mouth daily.  05/22/17 05/22/18  [provider]  clopidogrel (PLAVIX) 75 MG tablet  Take 1 tablet (75 mg total) by mouth daily. 10/03/17   [name], MD  ferrous sulfate 324 (65 Fe) MG TBEC Take 324 mg by mouth daily.  09/17/15   [provider]  latanoprost (XALATAN) 0.005 % ophthalmic solution Place 1 drop into both eyes at bedtime.  06/14/17   [provider]  metoprolol succinate (TOPROL XL) 25 MG 24 hr tablet Take 12.5 mg by mouth daily.    [provider]  nicotine polacrilex (COMMIT) 4 MG lozenge Take 4 mg by mouth as needed for smoking cessation.    [provider]  tamsulosin (FLOMAX) 0.4 MG CAPS capsule Take 1 capsule (0.4 mg total) by mouth daily after breakfast. 08/20/16   Waldron Session, MD  traMADol (ULTRAM) 50 MG tablet Take 50 mg by mouth 2 (two) times daily.  09/25/17   [provider]      PHYSICAL  EXAMINATION:   VITAL SIGNS: Blood pressure (!) 146/77, pulse 73, temperature 97.7 F (36.5 C), temperature source Oral, resp. rate 18, height 5\' 8"  (1.727 m), weight 55.8 kg, SpO2 98 %.  GENERAL:  72 y.o.-year-old patient lying in the bed with no acute distress.  EYES: Pupils equal, round, reactive to light and accommodation. No scleral icterus. Extraocular muscles intact.  HEENT: Head atraumatic, normocephalic. Oropharynx and nasopharynx clear.  Packing noted in both nares.  There is still some oozing noted through the packing. NECK:  Supple, no jugular venous distention. No thyroid enlargement, no tenderness.  LUNGS: Reduced breath sounds bilaterally, no wheezing, rales,rhonchi or crepitation. No use of accessory muscles of respiration.  CARDIOVASCULAR: S1, S2 normal. No S3/S4.  ABDOMEN: Soft, nontender, nondistended. Bowel sounds present. No organomegaly or mass.  EXTREMITIES: No pedal edema, cyanosis, or clubbing.  NEUROLOGIC: No focal weakness. PSYCHIATRIC: The patient is alert and oriented x 3.  SKIN: No obvious rash, lesion, or ulcer.   LABORATORY PANEL:   CBC Recent Labs  Lab 11/08/17 1838  WBC 6.1  HGB  11.4*  HCT 33.0*  PLT 173  MCV 90.9  MCH 31.4  MCHC 34.5  RDW 14.0  LYMPHSABS 1.8  MONOABS 0.5  EOSABS 0.1  BASOSABS 0.1   ------------------------------------------------------------------------------------------------------------------  Chemistries  Recent Labs  Lab 11/08/17 1838  NA 135  K 3.9  CL 104  CO2 26  GLUCOSE 95  BUN 15  CREATININE 0.84  CALCIUM 9.0   ------------------------------------------------------------------------------------------------------------------ estimated creatinine clearance is 62.7 mL/min (by C-G formula based on SCr of 0.84 mg/dL). ------------------------------------------------------------------------------------------------------------------ No results for input(s): TSH, T4TOTAL, T3FREE, THYROIDAB in the last 72 hours.  Invalid input(s): FREET3   Coagulation profile Recent Labs  Lab 11/08/17 1838  INR 0.97   ------------------------------------------------------------------------------------------------------------------- No results for input(s): DDIMER in the last 72 hours. -------------------------------------------------------------------------------------------------------------------  Cardiac Enzymes No results for input(s): CKMB, TROPONINI, MYOGLOBIN in the last 168 hours.  Invalid input(s): CK ------------------------------------------------------------------------------------------------------------------ Invalid input(s): POCBNP  ---------------------------------------------------------------------------------------------------------------  Urinalysis    Component Value Date/Time   COLORURINE YELLOW (A) 08/17/2016 0533   APPEARANCEUR CLEAR (A) 08/17/2016 0533   LABSPEC 1.009 08/17/2016 0533   PHURINE 6.0 08/17/2016 0533   GLUCOSEU NEGATIVE 08/17/2016 0533   HGBUR MODERATE (A) 08/17/2016 0533   BILIRUBINUR NEGATIVE 08/17/2016 0533   KETONESUR NEGATIVE 08/17/2016 0533   PROTEINUR 30 (A) 08/17/2016 0533    NITRITE NEGATIVE 08/17/2016 0533   LEUKOCYTESUR NEGATIVE 08/17/2016 0533     RADIOLOGY: No results found.  EKG: Orders placed or performed during the hospital encounter of 08/17/16  . EKG 12-Lead  . EKG 12-Lead    IMPRESSION AND PLAN:  1.  Epistaxis, controlled now status post bilateral packing done in emergency room.  Per ENT, patient is to keep the packing for 5 days and follow-up for removal at that time.  We will continue supportive measures with pain medication and nausea medication if needed.  We will hold aspirin and Plavix for now. 2.  COPD without exacerbation, continue home maintenance therapy. 3.  Hypertension, stable, continue home medications. 4.  Tobacco abuse.  Smoking cessation was discussed with patient in detail.  All the records are reviewed and case discussed with ED provider. Management plans discussed with the patient, family and they are in agreement.  CODE STATUS: Full Code Status History    Date Active Date Inactive Code Status Order ID Comments User Context   10/31/2017 1530 10/31/2017 1902 Full Code [id number]  [name], MD Inpatient  08/17/2016 1641 08/20/2016 2315 Full Code [id number]  Waldron Session, MD Inpatient   08/17/2016 0552 08/17/2016 1618 Full Code [id number]  Jules Husbands, MD ED   08/19/2015 1048 08/19/2015 1628 Full Code [id number]  [name], MD Inpatient   10/02/2012 1324 10/03/2012 1436 Full Code [id number]  [name], MD Inpatient       TOTAL TIME TAKING CARE OF THIS PATIENT: 45 minutes.    Amelia Jo M.D on 11/08/2017 at 9:38 PM  Between 7am to 6pm - Pager - [phone]  After 6pm go to [email or web address] - password [email or web address] [facility] [place] [facility]  [phone]  CC: Primary care physician; [name], MD

## 2017-11-09 LAB — BASIC METABOLIC PANEL
Anion gap: 4 — ABNORMAL LOW (ref 5–15)
BUN: 17 mg/dL (ref 8–23)
CHLORIDE: 106 mmol/L (ref 98–111)
CO2: 28 mmol/L (ref 22–32)
CREATININE: 0.87 mg/dL (ref 0.61–1.24)
Calcium: 8.5 mg/dL — ABNORMAL LOW (ref 8.9–10.3)
GFR calc non Af Amer: >60 mL/min (ref >60)
GLUCOSE: 114 mg/dL — AB (ref 70–99)
Potassium: 3.9 mmol/L (ref 3.5–5.1)
Sodium: 138 mmol/L (ref 135–145)

## 2017-11-09 LAB — CBC
HEMATOCRIT: 28.9 % — AB (ref 39.0–52.0)
Hemoglobin: 9.7 g/dL — ABNORMAL LOW (ref 13.0–17.0)
MCH: 30.9 pg (ref 26.0–34.0)
MCHC: 33.6 g/dL (ref 30.0–36.0)
MCV: 92 fL (ref 80.0–100.0)
NRBC: 0 % (ref 0.0–0.2)
Platelets: 158 10*3/uL (ref 150–400)
RBC: 3.14 MIL/uL — ABNORMAL LOW (ref 4.22–5.81)
RDW: 14 % (ref 11.5–15.5)
WBC: 6.1 10*3/uL (ref 4.0–10.5)

## 2017-11-09 LAB — GLUCOSE, CAPILLARY: Glucose-Capillary: 111 mg/dL — ABNORMAL HIGH (ref 70–99)

## 2017-11-09 MED ORDER — FERROUS SULFATE 325 (65 FE) MG PO TABS
325.0000 mg | ORAL_TABLET | Freq: Every day | ORAL | Status: DC
  Administered 2017-11-09: 325 mg via ORAL

## 2017-11-09 MED ORDER — AMOXICILLIN-POT CLAVULANATE 875-125 MG PO TABS: 1.0000 | ORAL_TABLET | Freq: Two times a day (BID) | ORAL | 0 refills | Status: DC

## 2017-11-09 MED ORDER — AMOXICILLIN-POT CLAVULANATE 875-125 MG PO TABS
1.0000 | ORAL_TABLET | Freq: Two times a day (BID) | ORAL | Status: DC
  Administered 2017-11-09: 1 via ORAL
  Filled 2017-11-09: qty 1

## 2017-11-09 NOTE — Progress Notes (Signed)
Discharge order received. Patient is alert and oriented. Vital signs stable . No signs of acute distress. Discharge instructions given. Patient verbalized understanding. No other issues noted at this time.   

## 2017-11-09 NOTE — Discharge Instructions (Addendum)
° °  Nosebleed A nosebleed is when blood comes out of the nose. Nosebleeds are common. They are usually not a sign of a serious medical problem. Follow these instructions at home: When you have a nosebleed:  Sit down.  Tilt your head a little forward.  Follow these steps: 1. Pinch your nose with a clean towel or tissue. 2. Keep pinching your nose for 10 minutes. Do not let go. 3. After 10 minutes, let go of your nose. 4. If there is still bleeding, do these steps again. Keep doing these steps until the bleeding stops.  Do not put things in your nose to stop the bleeding.  Try not to lie down or put your head back.  Use a nose spray decongestant as told by your doctor.  Do not use petroleum jelly or mineral oil in your nose. These things can get into your lungs. After a nosebleed:  Try not to blow your nose or sniffle for several hours.  Try not to strain, lift, or bend at the waist for several days.  Use saline spray or a humidifier as told by your doctor.  Aspirin and blood-thinning medicines make bleeding more likely. If you take these medicines, ask your doctor if you should stop taking them, or if you should change how much you take. Do not stop taking the medicine unless your doctor tells you to. Contact a doctor if:  You have a fever.  You get nosebleeds often.  You are getting nosebleeds more often than usual.  You bruise very easily.  You have something stuck in your nose.  You have bleeding in your mouth.  You throw up (vomit) or cough up brown material.  You get a nosebleed after you start a new medicine. Get help right away if:  You have a nosebleed after you fall or hurt your head.  Your nosebleed does not go away after 20 minutes.  You feel dizzy or weak.  You have unusual bleeding from other parts of your body.  You have unusual bruising on other parts of your body.  You get sweaty.  You throw up blood. Summary  Nosebleeds are common. They  are usually not a sign of a serious medical problem.  When you have a nosebleed, sit down and tilt your head a little forward. Pinch your nose with a clean tissue.  After the bleeding stops, try not to blow your nose or sniffle for several hours. This information is not intended to replace advice given to you by your health care provider. Make sure you discuss any questions you have with your health care provider. Document Released: 10/25/2007 Document Revised: 04/27/2016 Document Reviewed: 04/27/2016 Elsevier Interactive Patient Education  2018 [place] bilateral nasal packing in till seen by ENT on 11/13/2017  HOLD your ASA and plavix till seen by ENT

## 2017-11-09 NOTE — Discharge Summary (Signed)
[facility] [place] NAME: Todd Whitehead    MR#:  [id number]  DATE OF BIRTH:  [birth year]  DATE OF ADMISSION:  11/08/2017 ADMITTING PHYSICIAN: Amelia Jo, MD  DATE OF DISCHARGE: 11/09/2017  PRIMARY CARE PHYSICIAN: [name], MD    ADMISSION DIAGNOSIS:  Epistaxis [R04.0]  DISCHARGE DIAGNOSIS:  Epistaxis s/p bilateral nasal packing  SECONDARY DIAGNOSIS:   Past Medical History:  Diagnosis Date  . Anxiety   . Arthritis   . Colon polyp   . COPD (chronic obstructive pulmonary disease) ([place])    smoker  . Dysrhythmia   . Glaucoma   . Heart murmur   . Hypertension    on  no meds  . Melanoma ([place])    Resected from Left upper arm.   . Myocardial infarction ([place]) 2017  . Peripheral vascular disease ([place])   . Prostate enlargement     HOSPITAL COURSE:  Todd Whitehead  is a 72 y.o. male with a known history of tobacco abuse, COPD, hypertension, CAD, PAD and other comorbidities. Patient presented to emergency room for nosebleed going on for the past 1 hour and a half before coming to the hospital.  It started when he blew his nose.  He tried to apply pressure to the right side which seems to be the side there is bleeding, without any improvement.  He denies any recent trauma.  No prior similar episodes.  Patient is on aspirin and Plavix for his bilateral LE PAD.  1.  Epistaxis- bilateral nares after blowing hard -now packing + -patient not bleeding activity. Hemoglobin dropped from 11.429.7. He is otherwise hemodynamically stable. Blood pressure stable. - now status post bilateral packing done in emergency room.  Per ENT Dr Ladene Artist, patient is to keep the packing for 5 days and follow-up for removal at that time.Ok to go home -Empiric Augmentin started.  - We will continue supportive measures with pain medication and nausea medication if needed.   -We will hold aspirin and Plavix for now till seen by ENT as outpatient on  Wednesday, November 13, 2017  2.  COPD without exacerbation, continue home maintenance therapy.  3.  Hypertension, stable, continue home medications.  4.  Tobacco abuse.  Smoking cessation was discussed with patient in detail.  Discharge patient home with outpatient ENT follow-up on November 13, 2017. Patient did voice understanding.  CONSULTS OBTAINED:  Treatment Team:  Margaretha Sheffield, MD  DRUG ALLERGIES:   Allergies  Allergen Reactions  . Other Other (See Comments)    Cheese,butter,sour cream,cottage cheese  . Poison Ivy Extract Rash  . Poison Oak Extract Rash    DISCHARGE MEDICATIONS:   Allergies as of 11/09/2017      Reactions   Other Other (See Comments)   Cheese,butter,sour cream,cottage cheese   Poison Ivy Extract Rash   Poison Oak Extract Rash      Medication List    STOP taking these medications   aspirin EC 81 MG tablet   clopidogrel 75 MG tablet Commonly known as:  PLAVIX     TAKE these medications   acetaminophen 500 MG tablet Commonly known as:  TYLENOL Take 1 tablet (500 mg total) by mouth every 6 (six) hours as needed. What changed:  reasons to take this   ALPRAZolam 1 MG tablet Commonly known as:  XANAX Take 1 mg by mouth 3 (three) times daily.   amoxicillin-clavulanate 875-125 MG tablet Commonly known as:  AUGMENTIN Take 1 tablet by  mouth every 12 (twelve) hours.   atorvastatin 40 MG tablet Commonly known as:  LIPITOR Take 40 mg by mouth daily.   brimonidine 0.2 % ophthalmic solution Commonly known as:  ALPHAGAN Place 1 drop into both eyes every morning.   busPIRone 30 MG tablet Commonly known as:  BUSPAR Take 30 mg by mouth 2 (two) times daily.   citalopram 20 MG tablet Commonly known as:  CELEXA Take 20 mg by mouth daily.   ferrous sulfate 324 (65 Fe) MG Tbec Take 324 mg by mouth daily.   latanoprost 0.005 % ophthalmic solution Commonly known as:  XALATAN Place 1 drop into both eyes at bedtime.   nicotine polacrilex 4 MG  lozenge Commonly known as:  COMMIT Take 4 mg by mouth as needed for smoking cessation.   tamsulosin 0.4 MG Caps capsule Commonly known as:  FLOMAX Take 1 capsule (0.4 mg total) by mouth daily after breakfast.   TOPROL XL 25 MG 24 hr tablet Generic drug:  metoprolol succinate Take 12.5 mg by mouth daily.   traMADol 50 MG tablet Commonly known as:  ULTRAM Take 50 mg by mouth 2 (two) times daily.   VITAMIN B COMPLEX PO Take 1 tablet by mouth daily.       If you experience worsening of your admission symptoms, develop shortness of breath, life threatening emergency, suicidal or homicidal thoughts you must seek medical attention immediately by calling 911 or calling your MD immediately  if symptoms less severe.  You Must read complete instructions/literature along with all the possible adverse reactions/side effects for all the Medicines you take and that have been prescribed to you. Take any new Medicines after you have completely understood and accept all the possible adverse reactions/side effects.   Please note  You were cared for by a hospitalist during your hospital stay. If you have any questions about your discharge medications or the care you received while you were in the hospital after you are discharged, you can call the unit and asked to speak with the hospitalist on call if the hospitalist that took care of you is not available. Once you are discharged, your primary care physician will handle any further medical issues. Please note that NO REFILLS for any discharge medications will be authorized once you are discharged, as it is imperative that you return to your primary care physician (or establish a relationship with a primary care physician if you do not have one) for your aftercare needs so that they can reassess your need for medications and monitor your lab values. Today   SUBJECTIVE   No profuse bleeding from the nose. Packing present.  VITAL SIGNS:  Blood pressure  139/64, pulse 67, temperature 98.1 F (36.7 C), temperature source Oral, resp. rate (!) 24, height 5\' 8"  (1.727 m), weight 55.9 kg, SpO2 91 %.  I/O:    Intake/Output Summary (Last 24 hours) at 11/09/2017 1057 Last data filed at 11/09/2017 1052 Gross per 24 hour  Intake 662.79 ml  Output 1800 ml  Net -1137.21 ml    PHYSICAL EXAMINATION:  GENERAL:  72 y.o.-year-old patient lying in the bed with no acute distress.  EYES: Pupils equal, round, reactive to light and accommodation. No scleral icterus. Extraocular muscles intact.  HEENT: Head atraumatic, normocephalic. Oropharynx and nasopharynx clear. Bilateral nose packing present NECK:  Supple, no jugular venous distention. No thyroid enlargement, no tenderness.  LUNGS: Normal breath sounds bilaterally, no wheezing, rales,rhonchi or crepitation. No use of accessory muscles of respiration.  CARDIOVASCULAR: S1, S2 normal. No murmurs, rubs, or gallops.  ABDOMEN: Soft, non-tender, non-distended. Bowel sounds present. No organomegaly or mass.  EXTREMITIES: No pedal edema, cyanosis, or clubbing.  NEUROLOGIC: Cranial nerves II through XII are intact. Muscle strength 5/5 in all extremities. Sensation intact. Gait not checked.  PSYCHIATRIC: The patient is alert and oriented x 3.  SKIN: No obvious rash, lesion, or ulcer.   DATA REVIEW:   CBC  Recent Labs  Lab 11/09/17 0314  WBC 6.1  HGB 9.7*  HCT 28.9*  PLT 158    Chemistries  Recent Labs  Lab 11/09/17 0314  NA 138  K 3.9  CL 106  CO2 28  GLUCOSE 114*  BUN 17  CREATININE 0.87  CALCIUM 8.5*    Microbiology Results   No results found for this or any previous visit (from the past 240 hour(s)).  RADIOLOGY:  No results found.   Management plans discussed with the patient, family and they are in agreement.  CODE STATUS:     Code Status Orders  (From admission, onward)         Start     Ordered   11/08/17 2234  Full code  Continuous     11/08/17 2233        Code  Status History    Date Active Date Inactive Code Status Order ID Comments User Context   10/31/2017 1530 10/31/2017 1902 Full Code [id number]  [name], MD Inpatient   08/17/2016 1641 08/20/2016 2315 Full Code [id number]  Waldron Session, MD Inpatient   08/17/2016 0552 08/17/2016 1618 Full Code [id number]  Jules Husbands, MD ED   08/19/2015 1048 08/19/2015 1628 Full Code [id number]  [name], MD Inpatient   10/02/2012 1324 10/03/2012 1436 Full Code [id number]  [name], MD Inpatient    Advance Directive Documentation     Most Recent Value  Type of Advance Directive  Living will  Pre-existing out of facility DNR order (yellow form or pink MOST form)  -  "MOST" Form in Place?  -      TOTAL TIME TAKING CARE OF THIS PATIENT: 42minutes.    Fritzi Mandes M.D on 11/09/2017 at 10:57 AM  Between 7am to 6pm - Pager - [phone] After 6pm go to [email or web address] - password [email or web address] [facility]  Office  [phone]  CC: Primary care physician; [name], MD

## 2017-11-13 ENCOUNTER — Encounter (INDEPENDENT_AMBULATORY_CARE_PROVIDER_SITE_OTHER): Payer: Medicare Other

## 2017-11-13 ENCOUNTER — Ambulatory Visit (INDEPENDENT_AMBULATORY_CARE_PROVIDER_SITE_OTHER): Payer: Medicare Other | Admitting: Nurse Practitioner

## 2017-11-26 ENCOUNTER — Other Ambulatory Visit (INDEPENDENT_AMBULATORY_CARE_PROVIDER_SITE_OTHER): Payer: Self-pay | Admitting: Vascular Surgery

## 2017-11-26 DIAGNOSIS — I739 Peripheral vascular disease, unspecified: Secondary | ICD-10-CM

## 2017-11-27 ENCOUNTER — Encounter (INDEPENDENT_AMBULATORY_CARE_PROVIDER_SITE_OTHER): Payer: Self-pay | Admitting: Nurse Practitioner

## 2017-11-27 ENCOUNTER — Ambulatory Visit (INDEPENDENT_AMBULATORY_CARE_PROVIDER_SITE_OTHER): Payer: Medicare Other | Admitting: Nurse Practitioner

## 2017-11-27 ENCOUNTER — Ambulatory Visit (INDEPENDENT_AMBULATORY_CARE_PROVIDER_SITE_OTHER): Payer: Medicare Other

## 2017-11-27 VITALS — BP 98/58 | HR 74 | Resp 17 | Ht 70.0 in | Wt 126.0 lb

## 2017-11-27 DIAGNOSIS — F17210 Nicotine dependence, cigarettes, uncomplicated: Secondary | ICD-10-CM | POA: Diagnosis not present

## 2017-11-27 DIAGNOSIS — I10 Essential (primary) hypertension: Secondary | ICD-10-CM | POA: Diagnosis not present

## 2017-11-27 DIAGNOSIS — J449 Chronic obstructive pulmonary disease, unspecified: Secondary | ICD-10-CM

## 2017-11-27 DIAGNOSIS — I739 Peripheral vascular disease, unspecified: Secondary | ICD-10-CM

## 2017-11-27 NOTE — Progress Notes (Signed)
Subjective:    Patient ID: Todd Whitehead, male    DOB: 04/18/1945, 72 y.o.   MRN: [id number] Chief Complaint  Patient presents with  . Follow-up    4 week ABI follow up    HPI  Todd Whitehead is a 72 y.o. male that returns to the office for followup and review of the noninvasive studies. There have been no interval changes in lower extremity symptoms. No interval shortening of the patient's claudication distance or development of rest pain symptoms. No new ulcers or wounds have occurred since the last visit.  He endorses having pain in the left inner thigh, which is sore to palpation.  He denies the claudication and weakness that he had prior to his procedures.  There have been no significant changes to the patient's overall health care.  The patient denies amaurosis fugax or recent TIA symptoms. There are no recent neurological changes noted. The patient denies history of DVT, PE or superficial thrombophlebitis. The patient denies recent episodes of angina or shortness of breath.   Duplex ultrasound of the left lower extremity displayed normal lumen flow in the CFA, SFA and posterior tibial arteries.  The waveforms were triphasic at the level of the CFA and biphasic at the SFA and PTA. No DVT present in Common Femoral Vein.    Past Medical History:  Diagnosis Date  . Anxiety   . Arthritis   . Colon polyp   . COPD (chronic obstructive pulmonary disease) ([place])    smoker  . Dysrhythmia   . Glaucoma   . Heart murmur   . Hypertension    on  no meds  . Melanoma ([place])    Resected from Left upper arm.   . Myocardial infarction ([place]) 2017  . Peripheral vascular disease ([place])   . Prostate enlargement     Past Surgical History:  Procedure Laterality Date  . BACK SURGERY  01/19/98  . CARDIAC CATHETERIZATION N/A 08/19/2015   Procedure: Left Heart Cath and Coronary Angiography;  Surgeon: [name], MD;  Location: [place] [facility];  Service: Cardiovascular;  Laterality:  N/A;  . CHOLECYSTECTOMY N/A 08/19/2016   Procedure: LAPAROSCOPIC CHOLECYSTECTOMY WITH INTRAOPERATIVE CHOLANGIOGRAM;  Surgeon: [name], MD;  [facility];  Service: General;  Laterality: N/A;  . COLON SURGERY  age 59 days old   " bowel was twisted up"  . COLONOSCOPY WITH PROPOFOL N/A 03/19/2016   Procedure: COLONOSCOPY WITH PROPOFOL;  Surgeon: [name], MD;  [facility]: [facility] END[facility]COPY;  Service: Endoscopy;  Laterality: N/A;  . CORONARY ARTERY BYPASS GRAFT  2017  . ERCP N/A 08/18/2016   Procedure: ENDOSCOPIC RETROGRADE CHOLANGIOPANCREATOGRAPHY (ERCP);  Surgeon: [name], MD;  Location: [place];  Service: Gastroenterology;  Laterality: N/A;  . EYE SURGERY Left 03/26/11  . LOWER EXTREMITY ANGIOGRAPHY Left 10/03/2017   Procedure: LOWER EXTREMITY ANGIOGRAPHY;  Surgeon: [name], MD;  Location: [place] [facility];  Service: Cardiovascular;  Laterality: Left;  . LOWER EXTREMITY ANGIOGRAPHY Right 10/31/2017   Procedure: LOWER EXTREMITY ANGIOGRAPHY;  Surgeon: [name], MD;  Location: [place] [facility];  Service: Cardiovascular;  Laterality: Right;  . LUMBAR LAMINECTOMY/DECOMPRESSION MICRODISCECTOMY Right 10/02/2012   Procedure: Right Lumbar Five-Sacral One Lumbar laminotomy/Microdiskectomy;  Surgeon: [name], MD;  Location: [place] NEUR[facility] OR[facility];  Service: Neurosurgery;  Laterality: Right;  Right Lumbar Five-Sacral One Lumbar laminotomy/Microdiskectomy    Social History   Socioeconomic History  . Marital status: Divorced    Spouse name: Not  on file  . Number of children: Not on file  . Years of education: Not on file  . Highest education level: Not on file  Occupational History  . Not on file  Social Needs  . Financial resource strain: Not on file  . Food insecurity:    Worry: Not on file    Inability: Not on file  . Transportation needs:    Medical: Not on file    Non-medical: Not on file  Tobacco Use  . Smoking status: Current Every Day Smoker    Packs/day:  1.50    Years: 58.00    Pack years: 87.00    Types: Cigarettes  . Smokeless tobacco: Never Used  Substance and Sexual Activity  . Alcohol use: No  . Drug use: No  . Sexual activity: Not Currently  Lifestyle  . Physical activity:    Days per week: Not on file    Minutes per session: Not on file  . Stress: Not on file  Relationships  . Social connections:    Talks on phone: Not on file    Gets together: Not on file    Attends religious service: Not on file    Active member of club or organization: Not on file    Attends meetings of clubs or organizations: Not on file    Relationship status: Not on file  . Intimate partner violence:    Fear of current or ex partner: Not on file    Emotionally abused: Not on file    Physically abused: Not on file    Forced sexual activity: Not on file  Other Topics Concern  . Not on file  Social History Narrative  . Not on file    Family History  Problem Relation Age of Onset  . Heart attack Father   . Aneurysm Brother   . Lung cancer Brother     Allergies  Allergen Reactions  . Other Other (See Comments)    Cheese,butter,sour cream,cottage cheese  . Poison Ivy Extract Rash  . Poison Oak Extract Rash     Review of Systems   Review of Systems: Negative Unless Checked Constitutional: [] Weight loss  [] Fever  [] Chills Cardiac: [] Chest pain   []  Atrial Fibrillation  [] Palpitations   [] Shortness of breath when laying flat   [] Shortness of breath with exertion. Vascular:  [] Pain in legs with walking   [] Pain in legs with standing  [] History of DVT   [] Phlebitis   [] Swelling in legs   [] Varicose veins   [] Non-healing ulcers Pulmonary:   [] Uses home oxygen   [] Productive cough   [] Hemoptysis   [] Wheeze  [] COPD   [] Asthma Neurologic:  [] Dizziness   [] Seizures   [] History of stroke   [] History of TIA  [] Aphasia   [] Vissual changes   [] Weakness or numbness in arm   [] Weakness or numbness in leg Musculoskeletal:   [] Joint swelling   [] Joint  pain   [] Low back pain  []  History of Knee Replacement Hematologic:  [] Easy bruising  [x] Easy bleeding   [] Hypercoagulable state   [] Anemic Gastrointestinal:  [] Diarrhea   [] Vomiting  [] Gastroesophageal reflux/heartburn   [] Difficulty swallowing. Genitourinary:  [] Chronic kidney disease   [] Difficult urination  [] Anuric   [] Blood in urine Skin:  [] Rashes   [] Ulcers  Psychological:  [x] History of anxiety   []  History of major depression  []  Memory Difficulties     Objective:   Physical Exam  BP (!) 98/58 (BP Location: Left Arm, Patient Position: Sitting)  Pulse 74   Resp 17   Ht 5\' 10"  (1.778 m)   Wt 126 lb (57.2 kg)   BMI 18.08 kg/m   Gen: WD/WN, NAD Head: [place]/AT, No temporalis wasting.  Ear/Nose/Throat: Hearing grossly intact, nares w/o erythema or drainage Eyes: PER, EOMI, sclera nonicteric.  Neck: Supple, no masses.  No JVD.  Pulmonary:  Good air movement, no use of accessory muscles.  Cardiac: RRR Vascular:  Vessel Right Left  Radial Palpable Palpable  Dorsalis Pedis Palpable Palpable  Posterior Tibial Palpable Palpable   Gastrointestinal: soft, non-distended. No guarding/no peritoneal signs.  Musculoskeletal: M/S 5/5 throughout.  No deformity or atrophy.  Neurologic: Pain and light touch intact in extremities.  Symmetrical.  Speech is fluent. Motor exam as listed above. Psychiatric: Judgment intact, Mood & affect appropriate for pt's clinical situation. Dermatologic: No Venous rashes. No Ulcers Noted.  No changes consistent with cellulitis. Lymph : No Cervical lymphadenopathy, no lichenification or skin changes of chronic lymphedema.      Assessment & Plan:   1. PVD (peripheral vascular disease) with claudication (HCC)  Recommend:  The patient is status post successful angiogram with intervention.  The patient reports that the claudication symptoms and leg pain is essentially gone.   The patient denies lifestyle limiting changes at this point in time.  The pain  of the inner thigh is likely due to inflammation following angioplasty, patient advised to utilize cool compresses as well as tylenol.    No further invasive studies, angiography or surgery at this time The patient should continue walking and begin a more formal exercise program.  The patient should continue antiplatelet therapy and aggressive treatment of the lipid abnormalities  Smoking cessation was again discussed  The patient should continue wearing graduated compression socks 10-15 mmHg strength to control the mild edema.  Patient should undergo noninvasive studies as ordered. The patient will follow up with me after the studies.   - VAS US AORTA/IVC/ILIACS; Future  2. Essential hypertension Continue antihypertensive medications as already ordered, these medications have been reviewed and there are no changes at this time.   3. COPD (chronic obstructive pulmonary disease) with chronic bronchitis (HCC) Continue pulmonary medications and aerosols as already ordered, these medications have been reviewed and there are no changes at this time.     Current Outpatient Medications on File Prior to Visit  Medication Sig Dispense Refill  . acetaminophen (TYLENOL) 500 MG tablet Take 1 tablet (500 mg total) by mouth every 6 (six) hours as needed. (Patient taking differently: Take 500 mg by mouth every 6 (six) hours as needed for moderate pain or headache. ) 30 tablet 0  . ALPRAZolam (XANAX) 1 MG tablet Take 1 mg by mouth 3 (three) times daily.     Marland Kitchen atorvastatin (LIPITOR) 40 MG tablet Take 40 mg by mouth daily.     . B Complex Vitamins (VITAMIN B COMPLEX PO) Take 1 tablet by mouth daily.     . brimonidine (ALPHAGAN) 0.2 % ophthalmic solution Place 1 drop into both eyes every morning.     . busPIRone (BUSPAR) 30 MG tablet Take 30 mg by mouth 2 (two) times daily.    . citalopram (CELEXA) 20 MG tablet Take 20 mg by mouth daily.     . ferrous sulfate 324 (65 Fe) MG TBEC Take 324 mg by mouth  daily.     Marland Kitchen latanoprost (XALATAN) 0.005 % ophthalmic solution Place 1 drop into both eyes at bedtime.   2  . metoprolol  succinate (TOPROL XL) 25 MG 24 hr tablet Take 12.5 mg by mouth daily.    . nicotine polacrilex (COMMIT) 4 MG lozenge Take 4 mg by mouth as needed for smoking cessation.    . tamsulosin (FLOMAX) 0.4 MG CAPS capsule Take 1 capsule (0.4 mg total) by mouth daily after breakfast. 30 capsule 2  . traMADol (ULTRAM) 50 MG tablet Take 50 mg by mouth 2 (two) times daily.   2   No current facility-administered medications on file prior to visit.     There are no Patient Instructions on file for this visit. No follow-ups on file.   Kris Hartmann, NP  This note was completed with Sales executive.  Any errors are purely unintentional.

## 2017-11-29 ENCOUNTER — Ambulatory Visit (INDEPENDENT_AMBULATORY_CARE_PROVIDER_SITE_OTHER): Payer: Medicare Other | Admitting: Nurse Practitioner

## 2017-11-29 ENCOUNTER — Encounter (INDEPENDENT_AMBULATORY_CARE_PROVIDER_SITE_OTHER): Payer: Medicare Other

## 2018-02-07 ENCOUNTER — Ambulatory Visit (INDEPENDENT_AMBULATORY_CARE_PROVIDER_SITE_OTHER): Payer: Medicare Other

## 2018-02-07 ENCOUNTER — Encounter

## 2018-02-07 ENCOUNTER — Ambulatory Visit (INDEPENDENT_AMBULATORY_CARE_PROVIDER_SITE_OTHER): Payer: Medicare Other | Admitting: Vascular Surgery

## 2018-02-07 ENCOUNTER — Encounter (INDEPENDENT_AMBULATORY_CARE_PROVIDER_SITE_OTHER): Payer: Self-pay | Admitting: Vascular Surgery

## 2018-02-07 VITALS — BP 124/64 | HR 70 | Resp 16 | Ht 70.0 in | Wt 122.6 lb

## 2018-02-07 DIAGNOSIS — I10 Essential (primary) hypertension: Secondary | ICD-10-CM

## 2018-02-07 DIAGNOSIS — J449 Chronic obstructive pulmonary disease, unspecified: Secondary | ICD-10-CM | POA: Diagnosis not present

## 2018-02-07 DIAGNOSIS — E785 Hyperlipidemia, unspecified: Secondary | ICD-10-CM | POA: Diagnosis not present

## 2018-02-07 DIAGNOSIS — I739 Peripheral vascular disease, unspecified: Secondary | ICD-10-CM

## 2018-02-07 DIAGNOSIS — F17210 Nicotine dependence, cigarettes, uncomplicated: Secondary | ICD-10-CM

## 2018-02-07 NOTE — Assessment & Plan Note (Signed)
lipid control important in reducing the progression of atherosclerotic disease. Continue statin therapy  

## 2018-02-07 NOTE — Assessment & Plan Note (Signed)
Continue pulmonary medications and aerosols as already ordered, these medications have been reviewed and there are no changes at this time.   

## 2018-02-07 NOTE — Progress Notes (Signed)
MRN : [id number]  Todd Whitehead is a 73 y.o. (06/16/1945) male who presents with chief complaint of  Chief Complaint  Patient presents with  . Follow-up  .  History of Present Illness: Patient returns today in follow up of his PAD.  He is doing better after his interventions earlier this year and both legs have improvement in the claudication symptoms.  He had no periprocedural complications.  His access sites are well-healed.  Duplex today shows no stenosis within the left common iliac stent and no hemodynamically significant stenosis identified in either iliac system or the aorta.   Current Outpatient Medications  Medication Sig Dispense Refill  . acetaminophen (TYLENOL) 500 MG tablet Take 1 tablet (500 mg total) by mouth every 6 (six) hours as needed. (Patient taking differently: Take 500 mg by mouth every 6 (six) hours as needed for moderate pain or headache. ) 30 tablet 0  . ALPRAZolam (XANAX) 1 MG tablet Take 1 mg by mouth 3 (three) times daily.     . B Complex Vitamins (VITAMIN B COMPLEX PO) Take 1 tablet by mouth daily.     . brimonidine (ALPHAGAN) 0.2 % ophthalmic solution Place 1 drop into both eyes every morning.     . busPIRone (BUSPAR) 30 MG tablet Take 30 mg by mouth 2 (two) times daily.    . citalopram (CELEXA) 20 MG tablet Take 20 mg by mouth daily.     . ferrous sulfate 324 (65 Fe) MG TBEC Take 324 mg by mouth daily.     Marland Kitchen latanoprost (XALATAN) 0.005 % ophthalmic solution Place 1 drop into both eyes at bedtime.   2  . metoprolol succinate (TOPROL XL) 25 MG 24 hr tablet Take 12.5 mg by mouth daily.    . tamsulosin (FLOMAX) 0.4 MG CAPS capsule Take 1 capsule (0.4 mg total) by mouth daily after breakfast. 30 capsule 2  . traMADol (ULTRAM) 50 MG tablet Take 50 mg by mouth 2 (two) times daily.   2  . atorvastatin (LIPITOR) 40 MG tablet Take 40 mg by mouth daily.      No current facility-administered medications for this visit.     Past Medical History:  Diagnosis Date    . Anxiety   . Arthritis   . Colon polyp   . COPD (chronic obstructive pulmonary disease) ([place])    smoker  . Dysrhythmia   . Glaucoma   . Heart murmur   . Hypertension    on  no meds  . Melanoma ([place])    Resected from Left upper arm.   . Myocardial infarction ([place]) 2017  . Peripheral vascular disease ([place])   . Prostate enlargement     Past Surgical History:  Procedure Laterality Date  . BACK SURGERY  01/19/98  . CARDIAC CATHETERIZATION N/A 08/19/2015   Procedure: Left Heart Cath and Coronary Angiography;  Surgeon: [name], MD;  Location: [place] [facility];  Service: Cardiovascular;  Laterality: N/A;  . CHOLECYSTECTOMY N/A 08/19/2016   Procedure: LAPAROSCOPIC CHOLECYSTECTOMY WITH INTRAOPERATIVE CHOLANGIOGRAM;  Surgeon: [name], MD;  Location: [facility];  Service: General;  Laterality: N/A;  . COLON SURGERY  age 34 days old   " bowel was twisted up"  . COLONOSCOPY WITH PROPOFOL N/A 03/19/2016   Procedure: COLONOSCOPY WITH PROPOFOL;  Surgeon: [name], MD;  Location: [facility] ENDOSCOPY;  Service: Endoscopy;  Laterality: N/A;  . CORONARY ARTERY BYPASS GRAFT  2017  . ERCP N/A 08/18/2016   Procedure: ENDOSCOPIC  RETROGRADE CHOLANGIOPANCREATOGRAPHY (ERCP);  Surgeon: [name], MD;  Location: [place];  Service: Gastroenterology;  Laterality: N/A;  . EYE SURGERY Left 03/26/11  . LOWER EXTREMITY ANGIOGRAPHY Left 10/03/2017   Procedure: LOWER EXTREMITY ANGIOGRAPHY;  Surgeon: [name], MD;  Location: [place] [facility] LAB;  Service: Cardiovascular;  Laterality: Left;  . LOWER EXTREMITY ANGIOGRAPHY Right 10/31/2017   Procedure: LOWER EXTREMITY ANGIOGRAPHY;  Surgeon: [name], MD;  Location: [place] [facility] LAB;  Service: Cardiovascular;  Laterality: Right;  . LUMBAR LAMINECTOMY/DECOMPRESSION MICRODISCECTOMY Right 10/02/2012   Procedure: Right Lumbar Five-Sacral One Lumbar laminotomy/Microdiskectomy;  Surgeon: [name], MD;  Location: [place] NEURO ORS;  Service:  Neurosurgery;  Laterality: Right;  Right Lumbar Five-Sacral One Lumbar laminotomy/Microdiskectomy    Social History Social History   Tobacco Use  . Smoking status: Current Every Day Smoker    Packs/day: 1.50    Years: 58.00    Pack years: 87.00    Types: Cigarettes  . Smokeless tobacco: Never Used  Substance Use Topics  . Alcohol use: No  . Drug use: No     Family History Family History  Problem Relation Age of Onset  . Heart attack Father   . Aneurysm Brother   . Lung cancer Brother      Allergies  Allergen Reactions  . Other Other (See Comments)    Cheese,butter,sour cream,cottage cheese  . Poison Ivy Extract Rash  . Poison Oak Extract Rash     REVIEW OF SYSTEMS (Negative unless checked)  Constitutional: [] ?Weight loss  [] ?Fever  [] ?Chills Cardiac: [] ?Chest pain   [] ?Chest pressure   [] ?Palpitations   [] ?Shortness of breath when laying flat   [] ?Shortness of breath at rest   [x] ?Shortness of breath with exertion. Vascular:  [x] ?Pain in legs with walking   [] ?Pain in legs at rest   [] ?Pain in legs when laying flat   [x] ?Claudication   [] ?Pain in feet when walking  [] ?Pain in feet at rest  [] ?Pain in feet when laying flat   [] ?History of DVT   [] ?Phlebitis   [x] ?Swelling in legs   [] ?Varicose veins   [] ?Non-healing ulcers Pulmonary:   [] ?Uses home oxygen   [] ?Productive cough   [] ?Hemoptysis   [] ?Wheeze  [x] ?COPD   [] ?Asthma Neurologic:  [] ?Dizziness  [] ?Blackouts   [] ?Seizures   [] ?History of stroke   [] ?History of TIA  [] ?Aphasia   [] ?Temporary blindness   [] ?Dysphagia   [] ?Weakness or numbness in arms   [] ?Weakness or numbness in legs Musculoskeletal:  [x] ?Arthritis   [] ?Joint swelling   [] ?Joint pain   [] ?Low back pain Hematologic:  [] ?Easy bruising  [] ?Easy bleeding   [] ?Hypercoagulable state   [] ?Anemic  [] ?Hepatitis Gastrointestinal:  [] ?Blood in stool   [] ?Vomiting blood  [] ?Gastroesophageal reflux/heartburn   [x] ?Abdominal pain Genitourinary:  [] ?Chronic  kidney disease   [] ?Difficult urination  [] ?Frequent urination  [] ?Burning with urination   [] ?Hematuria Skin:  [] ?Rashes   [] ?Ulcers   [] ?Wounds Psychological:  [x] ?History of anxiety   [] ? History of major depression.    Physical Examination  BP 124/64 (BP Location: Right Arm, Patient Position: Sitting, Cuff Size: Normal)   Pulse 70   Resp 16   Ht 5\' 10"  (1.778 m)   Wt 122 lb 9.6 oz (55.6 kg)   BMI 17.59 kg/m  Gen:  WD/WN, NAD Head: [place]/AT, No temporalis wasting. Ear/Nose/Throat: Hearing grossly intact, nares w/o erythema or drainage Eyes: Conjunctiva clear. Sclera non-icteric Neck: Supple.  Trachea midline Pulmonary:  Good air movement, no use  of accessory muscles.  Cardiac: RRR, no JVD Vascular:  Vessel Right Left  Radial Palpable Palpable                          PT 1+ Palpable 1+ Palpable  DP 1+ Palpable 1+ Palpable    Musculoskeletal: M/S 5/5 throughout.  No deformity or atrophy. No edema. Neurologic: Sensation grossly intact in extremities.  Symmetrical.  Speech is fluent.  Psychiatric: Judgment intact, Mood & affect appropriate for pt's clinical situation. Dermatologic: No rashes or ulcers noted.  No cellulitis or open wounds.       Labs No results found for this or any previous visit (from the past 2160 hour(s)).  Radiology Vas US Aorta/ivc/iliacs  Result Date: 02/07/2018 ABDOMINAL AORTA STUDY Indications: PVD Vascular Interventions: Left CIA stent 10/03/2017 ; 10/31/2017 Rt PTA SFA to POP.  Comparison Study: none Performing Technologist: Concha Norway RVT  Examination Guidelines: A complete evaluation includes B-mode imaging, spectral Doppler, color Doppler, and power Doppler as needed of all accessible portions of each vessel. Bilateral testing is considered an integral part of a complete examination. Limited examinations for reoccurring indications may be performed as noted.  Abdominal Aorta Findings:  +-------------+-------+----------+----------+----------+--------+--------+ Location     AP (cm)Trans (cm)PSV (cm/s)Waveform  ThrombusComments +-------------+-------+----------+----------+----------+--------+--------+ Proximal                      76        biphasic                   +-------------+-------+----------+----------+----------+--------+--------+ Mid                           83        biphasic                   +-------------+-------+----------+----------+----------+--------+--------+ Distal                        102       monophasic                 +-------------+-------+----------+----------+----------+--------+--------+ RT CIA Prox                   113       biphasic                   +-------------+-------+----------+----------+----------+--------+--------+ RT CIA Mid                    119       biphasic                   +-------------+-------+----------+----------+----------+--------+--------+ RT CIA Distal                 109       monophasic                 +-------------+-------+----------+----------+----------+--------+--------+ RT EIA Prox                   169       biphasic                   +-------------+-------+----------+----------+----------+--------+--------+ RT EIA Mid                    155       biphasic                   +-------------+-------+----------+----------+----------+--------+--------+  RT EIA Distal                 169       biphasic                   +-------------+-------+----------+----------+----------+--------+--------+ LT CIA Prox                   195       monophasic                 +-------------+-------+----------+----------+----------+--------+--------+ LT CIA Mid                    124       monophasic                 +-------------+-------+----------+----------+----------+--------+--------+ LT CIA Distal                 168       biphasic                    +-------------+-------+----------+----------+----------+--------+--------+ LT EIA Prox                   150       biphasic                   +-------------+-------+----------+----------+----------+--------+--------+ LT EIA Mid                    211       biphasic                   +-------------+-------+----------+----------+----------+--------+--------+ LT EIA Distal                 207       biphasic                   +-------------+-------+----------+----------+----------+--------+--------+  Summary: Stenosis: +--------------------+-------------+-----------+ Location            Stenosis     Stent       +--------------------+-------------+-----------+ Prox Aorta          <50% stenosis            +--------------------+-------------+-----------+ Mid Aorta           <50% stenosis            +--------------------+-------------+-----------+ Distal Aorta        <50% stenosis            +--------------------+-------------+-----------+ Right Common Iliac  <50% stenosis            +--------------------+-------------+-----------+ Left Common Iliac   <50% stenosisno stenosis +--------------------+-------------+-----------+ Right External Iliac<50% stenosis            +--------------------+-------------+-----------+ Left External Iliac <50% stenosis            +--------------------+-------------+-----------+ IVC/Iliac: Moderate atherosclerosis throughuot the Abdominal aorta,CIA's, and EIA's bilaterally. Left CIA stent is patent.  *See table(s) above for measurements and observations.  Electronically signed by Leotis Pain MD on 02/07/2018 at 1:05:20 PM.    Final     Assessment/Plan  COPD (chronic obstructive pulmonary disease) with chronic bronchitis ([place]) Continue pulmonary medications and aerosols as already ordered, these medications have been reviewed and there are no changes at this time.    Hyperlipidemia lipid control important in reducing the progression  of atherosclerotic disease. Continue statin therapy   PVD (peripheral vascular disease) with claudication ([place]) Noninvasive studies today demonstrate a patent  left iliac stent in the common iliac artery.  No clear focal stenosis was identified in either iliac today.  He is doing better symptomatically.  He will return in follow-up in 6 months with ABIs or sooner if problems develop in the interim.    Leotis Pain, MD  02/07/2018 1:14 PM    This note was created with Dragon medical transcription system.  Any errors from dictation are purely unintentional

## 2018-02-07 NOTE — Assessment & Plan Note (Signed)
Noninvasive studies today demonstrate a patent left iliac stent in the common iliac artery.  No clear focal stenosis was identified in either iliac today.  He is doing better symptomatically.  He will return in follow-up in 6 months with ABIs or sooner if problems develop in the interim.

## 2018-02-28 ENCOUNTER — Ambulatory Visit (INDEPENDENT_AMBULATORY_CARE_PROVIDER_SITE_OTHER): Payer: Medicare Other | Admitting: Vascular Surgery

## 2018-02-28 ENCOUNTER — Encounter (INDEPENDENT_AMBULATORY_CARE_PROVIDER_SITE_OTHER): Payer: Medicare Other

## 2018-07-25 ENCOUNTER — Telehealth: Payer: Self-pay

## 2018-07-25 NOTE — Telephone Encounter (Signed)
Call pt regarding lung screening. Pt is a current smoker, smoking about 1 to 1 1/2  pack per day. Would like scan in the morning on any day. Pt denies any new health  issue at this time.

## 2018-07-28 ENCOUNTER — Telehealth: Payer: Self-pay | Admitting: *Deleted

## 2018-07-28 DIAGNOSIS — Z122 Encounter for screening for malignant neoplasm of respiratory organs: Secondary | ICD-10-CM

## 2018-07-28 DIAGNOSIS — Z87891 Personal history of nicotine dependence: Secondary | ICD-10-CM

## 2018-07-28 NOTE — Telephone Encounter (Signed)
Patient has been notified that annual lung cancer screening low dose CT scan is due currently or will be in near future. Confirmed that patient is within the age range of 55-77, and asymptomatic, (no signs or symptoms of lung cancer). Patient denies illness that would prevent curative treatment for lung cancer if found. Verified smoking history, (current, 114 pack year). The shared decision making visit was done 07/03/16. Patient is agreeable for CT scan being scheduled.

## 2018-07-30 ENCOUNTER — Ambulatory Visit
Admission: RE | Admit: 2018-07-30 | Discharge: 2018-07-30 | Disposition: A | Discharge status: Home / Self Care | Payer: Medicare Other | Source: Ambulatory Visit | Attending: Nurse Practitioner | Admitting: Nurse Practitioner

## 2018-07-30 ENCOUNTER — Other Ambulatory Visit: Payer: Self-pay

## 2018-07-30 DIAGNOSIS — Z122 Encounter for screening for malignant neoplasm of respiratory organs: Secondary | ICD-10-CM

## 2018-07-30 DIAGNOSIS — Z87891 Personal history of nicotine dependence: Secondary | ICD-10-CM | POA: Insufficient documentation

## 2018-07-31 ENCOUNTER — Encounter: Payer: Self-pay | Admitting: *Deleted

## 2018-08-12 ENCOUNTER — Ambulatory Visit (INDEPENDENT_AMBULATORY_CARE_PROVIDER_SITE_OTHER): Payer: Medicare Other | Admitting: Vascular Surgery

## 2018-08-12 ENCOUNTER — Other Ambulatory Visit: Payer: Self-pay

## 2018-08-12 ENCOUNTER — Ambulatory Visit (INDEPENDENT_AMBULATORY_CARE_PROVIDER_SITE_OTHER): Payer: Medicare Other

## 2018-08-12 ENCOUNTER — Encounter (INDEPENDENT_AMBULATORY_CARE_PROVIDER_SITE_OTHER): Payer: Self-pay

## 2018-08-12 DIAGNOSIS — I739 Peripheral vascular disease, unspecified: Secondary | ICD-10-CM | POA: Diagnosis not present

## 2018-08-12 NOTE — Assessment & Plan Note (Signed)
blood pressure control important in reducing the progression of atherosclerotic disease.  Not on meds

## 2018-08-12 NOTE — Progress Notes (Signed)
Patient had [place], but then went home to be called with the results

## 2018-08-19 ENCOUNTER — Other Ambulatory Visit (INDEPENDENT_AMBULATORY_CARE_PROVIDER_SITE_OTHER): Payer: Self-pay | Admitting: Vascular Surgery

## 2018-08-19 DIAGNOSIS — I739 Peripheral vascular disease, unspecified: Secondary | ICD-10-CM

## 2018-08-20 ENCOUNTER — Encounter (INDEPENDENT_AMBULATORY_CARE_PROVIDER_SITE_OTHER): Payer: Self-pay | Admitting: Vascular Surgery

## 2018-08-28 ENCOUNTER — Other Ambulatory Visit: Payer: Self-pay

## 2018-08-28 ENCOUNTER — Other Ambulatory Visit: Payer: Self-pay | Admitting: Internal Medicine

## 2018-08-28 ENCOUNTER — Ambulatory Visit
Admission: RE | Admit: 2018-08-28 | Discharge: 2018-08-28 | Disposition: A | Discharge status: Home / Self Care | Payer: Medicare Other | Source: Ambulatory Visit | Attending: Internal Medicine | Admitting: Internal Medicine

## 2018-08-28 DIAGNOSIS — R1013 Epigastric pain: Secondary | ICD-10-CM | POA: Diagnosis present

## 2018-08-28 MED ORDER — IOHEXOL 300 MG/ML  SOLN
75.0000 mL | Freq: Once | INTRAMUSCULAR | Status: AC | PRN
  Administered 2018-08-28: 75 mL via INTRAVENOUS

## 2018-08-29 ENCOUNTER — Other Ambulatory Visit: Payer: Self-pay | Admitting: General Surgery

## 2018-08-29 DIAGNOSIS — Q433 Congenital malformations of intestinal fixation: Secondary | ICD-10-CM

## 2018-09-01 ENCOUNTER — Ambulatory Visit
Admission: RE | Admit: 2018-09-01 | Discharge: 2018-09-01 | Disposition: A | Discharge status: Home / Self Care | Payer: Medicare Other | Source: Ambulatory Visit | Attending: General Surgery | Admitting: General Surgery

## 2018-09-01 ENCOUNTER — Other Ambulatory Visit: Payer: Self-pay

## 2018-09-01 DIAGNOSIS — Q433 Congenital malformations of intestinal fixation: Secondary | ICD-10-CM | POA: Diagnosis present

## 2018-09-15 ENCOUNTER — Other Ambulatory Visit
Admission: RE | Admit: 2018-09-15 | Discharge: 2018-09-15 | Disposition: A | Discharge status: Home / Self Care | Payer: Medicare Other | Source: Ambulatory Visit | Attending: Surgery | Admitting: Surgery

## 2018-09-15 ENCOUNTER — Other Ambulatory Visit: Payer: Self-pay

## 2018-09-15 DIAGNOSIS — Z20828 Contact with and (suspected) exposure to other viral communicable diseases: Secondary | ICD-10-CM | POA: Insufficient documentation

## 2018-09-15 DIAGNOSIS — Z01812 Encounter for preprocedural laboratory examination: Secondary | ICD-10-CM | POA: Diagnosis present

## 2018-09-15 LAB — SARS CORONAVIRUS 2 (TAT 6-24 HRS): SARS Coronavirus 2: NEGATIVE

## 2018-09-18 ENCOUNTER — Encounter: Payer: Self-pay | Admitting: Emergency Medicine

## 2018-09-18 ENCOUNTER — Ambulatory Visit: Payer: Medicare Other | Admitting: Certified Registered Nurse Anesthetist

## 2018-09-18 ENCOUNTER — Ambulatory Visit
Admission: RE | Admit: 2018-09-18 | Discharge: 2018-09-18 | Disposition: A | Discharge status: Home / Self Care | Payer: Medicare Other | Attending: Surgery | Admitting: Surgery

## 2018-09-18 ENCOUNTER — Encounter
Admission: RE | Disposition: A | Discharge status: Home / Self Care | Payer: Self-pay | Source: Home / Self Care | Attending: Surgery

## 2018-09-18 DIAGNOSIS — Z86010 Personal history of colonic polyps: Secondary | ICD-10-CM | POA: Insufficient documentation

## 2018-09-18 DIAGNOSIS — Z7902 Long term (current) use of antithrombotics/antiplatelets: Secondary | ICD-10-CM | POA: Diagnosis not present

## 2018-09-18 DIAGNOSIS — F329 Major depressive disorder, single episode, unspecified: Secondary | ICD-10-CM | POA: Insufficient documentation

## 2018-09-18 DIAGNOSIS — E1151 Type 2 diabetes mellitus with diabetic peripheral angiopathy without gangrene: Secondary | ICD-10-CM | POA: Diagnosis not present

## 2018-09-18 DIAGNOSIS — K2950 Unspecified chronic gastritis without bleeding: Secondary | ICD-10-CM | POA: Insufficient documentation

## 2018-09-18 DIAGNOSIS — I252 Old myocardial infarction: Secondary | ICD-10-CM | POA: Insufficient documentation

## 2018-09-18 DIAGNOSIS — F17210 Nicotine dependence, cigarettes, uncomplicated: Secondary | ICD-10-CM | POA: Insufficient documentation

## 2018-09-18 DIAGNOSIS — Z79899 Other long term (current) drug therapy: Secondary | ICD-10-CM | POA: Insufficient documentation

## 2018-09-18 DIAGNOSIS — J449 Chronic obstructive pulmonary disease, unspecified: Secondary | ICD-10-CM | POA: Insufficient documentation

## 2018-09-18 DIAGNOSIS — N183 Chronic kidney disease, stage 3 (moderate): Secondary | ICD-10-CM | POA: Insufficient documentation

## 2018-09-18 DIAGNOSIS — Z951 Presence of aortocoronary bypass graft: Secondary | ICD-10-CM | POA: Insufficient documentation

## 2018-09-18 DIAGNOSIS — E785 Hyperlipidemia, unspecified: Secondary | ICD-10-CM | POA: Insufficient documentation

## 2018-09-18 DIAGNOSIS — K449 Diaphragmatic hernia without obstruction or gangrene: Secondary | ICD-10-CM | POA: Diagnosis not present

## 2018-09-18 DIAGNOSIS — F419 Anxiety disorder, unspecified: Secondary | ICD-10-CM | POA: Insufficient documentation

## 2018-09-18 DIAGNOSIS — I2510 Atherosclerotic heart disease of native coronary artery without angina pectoris: Secondary | ICD-10-CM | POA: Insufficient documentation

## 2018-09-18 DIAGNOSIS — R1013 Epigastric pain: Secondary | ICD-10-CM | POA: Diagnosis present

## 2018-09-18 DIAGNOSIS — Z9049 Acquired absence of other specified parts of digestive tract: Secondary | ICD-10-CM | POA: Insufficient documentation

## 2018-09-18 DIAGNOSIS — I129 Hypertensive chronic kidney disease with stage 1 through stage 4 chronic kidney disease, or unspecified chronic kidney disease: Secondary | ICD-10-CM | POA: Diagnosis not present

## 2018-09-18 DIAGNOSIS — K3189 Other diseases of stomach and duodenum: Secondary | ICD-10-CM | POA: Insufficient documentation

## 2018-09-18 DIAGNOSIS — E1122 Type 2 diabetes mellitus with diabetic chronic kidney disease: Secondary | ICD-10-CM | POA: Diagnosis not present

## 2018-09-18 HISTORY — PX: ESOPHAGOGASTRODUODENOSCOPY (EGD) WITH PROPOFOL: SHX5813

## 2018-09-18 SURGERY — ESOPHAGOGASTRODUODENOSCOPY (EGD) WITH PROPOFOL
Anesthesia: General

## 2018-09-18 MED ORDER — MIDAZOLAM HCL 5 MG/5ML IJ SOLN
INTRAMUSCULAR | Status: DC | PRN
  Administered 2018-09-18: 2 mg via INTRAVENOUS

## 2018-09-18 MED ORDER — MIDAZOLAM HCL 2 MG/2ML IJ SOLN
INTRAMUSCULAR | Status: AC
  Filled 2018-09-18: qty 2

## 2018-09-18 MED ORDER — PROPOFOL 500 MG/50ML IV EMUL
INTRAVENOUS | Status: DC | PRN
  Administered 2018-09-18: 100 ug/kg/min via INTRAVENOUS

## 2018-09-18 MED ORDER — PROPOFOL 10 MG/ML IV BOLUS
INTRAVENOUS | Status: DC | PRN
  Administered 2018-09-18 (×2): 50 mg via INTRAVENOUS

## 2018-09-18 MED ORDER — LIDOCAINE 2% (20 MG/ML) 5 ML SYRINGE
INTRAMUSCULAR | Status: DC | PRN
  Administered 2018-09-18: 25 mg via INTRAVENOUS

## 2018-09-18 MED ORDER — SODIUM CHLORIDE 0.9 % IV SOLN
INTRAVENOUS | Status: DC
  Administered 2018-09-18: 1000 mL via INTRAVENOUS

## 2018-09-18 NOTE — H&P (Signed)
PATIENT PROFILE: Todd Whitehead is a 73 y.o. male who presents to the Clinic for consultation at the request of Dr. Ouida Sills for evaluation of suspected small bowel obstruction.  PCP: Harrold Donath, MD  HISTORY OF PRESENT ILLNESS: [name] reports having abdominal pain since 2 weeks ago. He reports that the pain started in the lower abdomen and radiated up to his epigastric area. When he showed me with his hand where the pain started is mainly in the suprapubic area that radiates up to his periumbilical and epigastric area. There is no alleviating or aggravating factor. Patient is able to eat regular diet without worsening abdominal pain. He endorses having regular bowel movement. He does not have any nausea or any vomiting at all. Upon evaluation of primary care physician, he had a CT scan of the abdomen and pelvis showing a  Whirling sign in the left hemiabdomen. I personally evaluated these images. I also evaluated the CT scan from 2019, 2018 and 2017. In all of the CT scan the patient has the same swirl of the small bowel. There is contrast in the ascending colon. There is no significant bowel dilation. Patient has no vomiting or nausea.  I also seen the CT scan from all these years that the patient has a severely distended bladder. Patient reports that he has normal urine amount. He denies any dribbling or urinary problem.  Patient reports that he has tried Pepto-Bismol for this pain without any improvement.  Patient has history of laparoscopic cholecystectomy. He also has a history of abdominal surgery when he was 74 days old. He reports that they told him that he had the small and large intestine twisted and grouped in one side of the abdomen.  PROBLEM LIST: Problem List Date Reviewed: 06/02/2018  Noted  Hyperlipidemia 02/07/2018  Overview  Last Assessment & Plan:  lipid control important in reducing the progression of atherosclerotic disease. Continue statin  therapy   Peripheral vascular disease (CMS-HCC) 11/26/2017  Hypertension 06/25/2017  Overview  on no meds  Last Assessment & Plan:  blood pressure control important in reducing the progression of atherosclerotic disease.   Type 2 diabetes mellitus with stage 3 chronic kidney disease, without long-term current use of insulin (CMS-HCC) 12/24/2016  Thoracic aortic atherosclerosis (CMS-HCC) 07/12/2016  Overview  On lung ct for screening   Health care maintenance 12/19/2015  Overview  Flu yearly, working on tob dc.   MEDICARE WELLNESS VISIT  PROVIDERS RENDERING CARE Dr Ouida Sills, Dr Ubaldo Glassing, Dr Sherwood Gambler, [place]  (1) Hearing: Demonstrates normal hearing in conversation.  (2) Risk of Falls: No reports of falls or abnormal balance. Gait is observed to be good upon observation.  (3) Home Safety; Home is safe and secure (4) Activities of Daily Living; Household chores and grooming are managed without problems. Personal finances are managed without problems.   DEPRESSION SCREENING There does not seem to be loss of interest in activities nor excess crying or changes in sleep or appetite.   COGNITIVE SCREENING Orientation is appropriate as are responses to questions and general conversation. No reports of forgetfulness or losing things.    PREVENTION PLAN Cardiovascular; Yearly cholesterol or more with known vascular disease Diabetes; Yearly glucose screening Colon Cancer; Will consider hemocults, has seen gi Glaucoma; Yearly eye exam needed Pneumonia; Pneumovax 11-17, prevnar 13 in 11-18 Shingles; Shingrix 8-18 obtained Influenza: Yearly in the fall CT screening being done yearly in June Smoking Cessation; Discussed at length and working on [employer]  OTHER PERSONALIZED HEALTH ADVISE General activity and walking.  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) 03/21/2015  Current tobacco use (Chronic) 03/21/2015  Overview  Understands need to quit and is attempting to and modulating   Depression, major, in remission (CMS-HCC) (Chronic) 03/21/2015  Overview  With severe anxiety, controlled on buspirone and celexa and xanax prn (trying to wean dose but problematic for him)     GENERAL REVIEW OF SYSTEMS:   General ROS: negative for - chills, fatigue, fever, weight gain or weight loss Allergy and Immunology ROS: negative for - hives  Hematological and Lymphatic ROS: negative for - bleeding problems or bruising, negative for palpable nodes Endocrine ROS: negative for - heat or cold intolerance, hair changes Respiratory ROS: negative for - cough, shortness of breath or wheezing Cardiovascular ROS: no chest pain or palpitations GI ROS: negative for nausea, vomiting, diarrhea, constipation. Positive for abdominal pain Musculoskeletal ROS: negative for - joint swelling or muscle pain Neurological ROS: negative for - confusion, syncope Dermatological ROS: negative for pruritus and rash Psychiatric: negative for anxiety, depression, difficulty sleeping and memory loss  MEDICATIONS: Current Outpatient Medications  Medication Sig Dispense Refill  . acetaminophen  (TYLENOL) 500 MG tablet Take by mouth.  . ALPRAZolam (XANAX) 0.5 MG tablet Take 0.5 tablets (0.25 mg total) by mouth nightly as needed for Sleep 30 tablet 2  . atorvastatin (LIPITOR) 40 MG tablet TAKE 1 TABLET BY MOUTH EVERY DAY 90 tablet 0  . brimonidine (ALPHAGAN) 0.2 % ophthalmic solution Place 1 drop into both eyes 2 (two) times daily 0  . busPIRone (BUSPAR) 30 MG tablet TAKE 1 TABLET(30 MG) BY MOUTH TWICE DAILY 180 tablet 1  . citalopram (CELEXA) 20 MG tablet TAKE 1 TABLET(20 MG) BY MOUTH EVERY DAY 90 tablet 1  . clopidogreL (PLAVIX) 75 mg tablet Take 75 mg by mouth once daily  . latanoprost (XALATAN) 0.005 % ophthalmic solution place 1 drop into both eyes at bedtime 0  . metoprolol succinate (TOPROL-XL) 25 MG XL tablet Take 0.5 tablets (12.5 mg total) by mouth once daily 90 tablet 1  . tamsulosin (FLOMAX) 0.4 mg capsule TAKE 1 CAPSULE BY MOUTH EVERY DAY AFTER BREAKFAST 90 capsule 0  . traMADoL (ULTRAM) 50 mg tablet Take 1 tablet (50 mg total) by mouth 2 (two) times daily as needed 1 po bid prn 60 tablet 5  . pantoprazole (PROTONIX) 40 MG DR tablet Take 1 tablet (40 mg total) by mouth 2 (two) times daily 60 tablet 11  . sucralfate (CARAFATE) 100 mg/mL suspension Take 10 mLs (1 g total) by mouth 4 (four) times daily before meals and nightly 1200 mL 11   No current facility-administered medications for this visit.   ALLERGIES:  Other; Poison ivy extract; and Poison oak extract  PAST MEDICAL HISTORY: Past Medical History:  Diagnosis Date  . Arthritis  . Benign non-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 03/21/2015  . CAD (coronary artery disease)  . Chest pain on exertion  . Chronic lower back pain  . Chronic respiratory failure with hypoxia (CMS-HCC)  . 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 Current tobacco use 03/21/2015  Understands need to quit and is attempting to and modulating, heavy  . Depression, major, in  remission (CMS-HCC) 03/21/2015  With anxiety controlled with buspirone and xanax prn  . Depression, major, in remission (CMS-HCC)  . Hyperplastic colon polyp 03/19/2016  . Inguinal hernia  right side  . Melena  . Pre-diabetes  . S/P CABG x 2  . Tortuous colon 03/19/2016  . Tubular adenoma of colon, unspecified 03/19/2016   PAST SURGICAL HISTORY: Past Surgical History:  Procedure Laterality Date  . CHOLECYSTECTOMY OPEN  . COLONOSCOPY 03/19/2016  Tubular adenoma of colon/Hyperplastic colon polyp/Tortuous colon/Repeat 37yr/MUS  . CORONARY ARTERY BYPASS W/SINGLE ARTERY GRAFT N/A 09/09/2015  Procedure: CORONARY ARTERY BYPASS, USING ARTERIAL GRAFT(S); SINGLE ARTERIAL GRAFT, Left internal mammary artery harvest; Surgeon: MPamala Duffel MD; Location: [facility] OPERATING ROOMS; Service: Cardiothoracic; Laterality: N/A;  . CORONARY ARTERY BYPASS W/VEIN ONLY N/A 09/09/2015  Procedure: CORONARY ARTERY BYPASS, USING VENOUS GRAFT(S) AND ARTERIAL GRAFT(S);SINGLE VEIN GRAFT (LIST IN ADDITION TO PRIMARY PROCEDURE); Surgeon: MPamala Duffel MD; Location: [facility]; Service: Cardiothoracic; Laterality: N/A;  . history of surgery  history of abdominal surgery as a baby, unknown details  . LAMINECTOMY LUMBAR SPINE  2 lumbar surgeries, 1999 and 2012    FAMILY HISTORY: Family History  Problem Relation Age of Onset  . Coronary Artery Disease (Blocked arteries around heart) Father    SOCIAL HISTORY: Social History   Socioeconomic History  . Marital status: Divorced  Spouse name: Not on file  . Number of children: Not on file  . Years of education: Not on file  . Highest education level: Not on file  Occupational History  . Occupation: Retired  SScientific laboratory technician . Financial resource strain: Not on file  . Food insecurity:  Worry: Not on file  Inability: Not on file  . Transportation needs:  Medical: Not on file  Non-medical: Not on file  Tobacco Use  . Smoking status: Current Every  Day Smoker  Packs/day: 1.50  Years: 67.00  Pack years: 100.50  Types: Cigarettes  Start date: 136 . Smokeless tobacco: Never Used  Substance and Sexual Activity  . Alcohol use: No  . Drug use: No  . Sexual activity: Defer  Other Topics Concern  . Not on file  Social History Narrative  . Not on file   PHYSICAL EXAM: Vitals:  08/29/18 0816  BP: 117/63  Pulse: 75   Body mass index is 18.79 kg/m. Weight: 54.4 kg (120 lb)   GENERAL: Alert, active, oriented x3  HEENT: Pupils equal reactive to light. Extraocular movements are intact. Sclera clear. Palpebral conjunctiva normal red color.Pharynx clear.  NECK: Supple with no palpable mass and no adenopathy.  LUNGS: Sound clear with no rales rhonchi or wheezes.  HEART: Regular rhythm S1 and S2 without murmur.  ABDOMEN: Soft and depressible, nontender with no palpable mass, no hepatomegaly. Scar right to the midline. Small scars from laparoscopic cholecystectomy.  EXTREMITIES: Well-developed well-nourished symmetrical with no dependent edema.  NEUROLOGICAL: Awake alert oriented, facial expression symmetrical, moving all extremities.  REVIEW OF DATA: I have reviewed the following data today: Office Visit on 08/28/2018  Component Date Value  . WBC (White Blood Cell Co* 08/28/2018 6.9  . RBC (Red Blood Cell Coun* 08/28/2018 3.88*  . Hemoglobin 08/28/2018 12.0*  . Hematocrit 08/28/2018 34.5*  . MCV (Mean Corpuscular Vo* 08/28/2018 88.9  . MCH (Mean Corpuscular He* 08/28/2018 30.9  . MCHC (Mean Corpuscular H* 08/28/2018 34.8  . Platelet Count 08/28/2018 178  . RDW-CV (Red Cell Distrib* 08/28/2018 14.3  . MPV (Mean Platelet Volum* 08/28/2018 8.8*  . Neutrophils 08/28/2018 4.84  . Lymphocytes 08/28/2018 1.60  . Monocytes 08/28/2018 0.36  . Eosinophils 08/28/2018 0.01  . Basophils 08/28/2018 0.03  . Neutrophil % 08/28/2018 70.7*  . Lymphocyte % 08/28/2018 23.3  . Monocyte % 08/28/2018 5.2  . Eosinophil % 08/28/2018 0.1*   . Basophil% 08/28/2018 0.4  . Immature Granulocyte % 08/28/2018 0.3  . Immature Granulocyte Cou* 08/28/2018 0.02  . Glucose 08/28/2018 109  . Sodium 08/28/2018 131*  . Potassium 08/28/2018 3.8  . Chloride 08/28/2018 97  . Carbon Dioxide (CO2) 08/28/2018 27.0  . Urea Nitrogen (BUN) 08/28/2018 16  . Creatinine 08/28/2018 1.0  . Glomerular Filtration Ra* 08/28/2018 73  . Calcium 08/28/2018 9.1  . AST 08/28/2018 23  . ALT 08/28/2018 16  . Alk Phos (alkaline Phosp* 08/28/2018 109*  . Albumin 08/28/2018 4.2  . Bilirubin, Total 08/28/2018 0.6  . Protein, Total 08/28/2018 6.4  . A/G Ratio 08/28/2018 1.9  . Lipase 08/28/2018 43  . Color 08/28/2018 Yellow  . Clarity 08/28/2018 Clear  . Specific Gravity 08/28/2018 <=1.005  . pH, Urine 08/28/2018 6.5  . Protein, Urinalysis 08/28/2018 30 *  . Glucose, Urinalysis 08/28/2018 Negative  . Ketones, Urinalysis 08/28/2018 Negative  . Blood, Urinalysis 08/28/2018 Small*  . Nitrite, Urinalysis 08/28/2018 Negative  . Leukocyte Esterase, Urin* 08/28/2018 Negative  . White Blood Cells, Urina* 08/28/2018 None Seen  . Red Blood Cells, Urinaly* 08/28/2018 4-10*  . Bacteria, Urinalysis 08/28/2018 None Seen  . Squamous Epithelial Cell* 08/28/2018 Rare  . Helicobacter Pylori Anti* 08/28/2018 Positive*  Appointment on 07/16/2018  Component Date Value  . Hemoccult ICT 07/16/2018 Negative  . Hemoccult ICT 07/16/2018 Negative  Office Visit on 07/09/2018  Component Date Value  . Glucose 07/09/2018 106  . Sodium 07/09/2018 133*  . Potassium 07/09/2018 4.4  . Chloride 07/09/2018 99  . Carbon Dioxide (CO2) 07/09/2018 27.6  . Urea Nitrogen (BUN) 07/09/2018 16  . Creatinine 07/09/2018 1.1  . Glomerular Filtration Ra* 07/09/2018 66  . Calcium 07/09/2018 9.1  . AST 07/09/2018 25  . ALT 07/09/2018 17  . Alk Phos (alkaline Phosp* 07/09/2018 106*  . Albumin 07/09/2018 4.2  . Bilirubin, Total 07/09/2018 0.5  . Protein, Total 07/09/2018 6.4  . A/G Ratio  07/09/2018 1.9  . Hemoglobin A1C 07/09/2018 6.4*  . Average Blood Glucose (C* 07/09/2018 137  . Cholesterol, Total 07/09/2018 107  . Triglyceride 07/09/2018 56  . HDL (High Density Lipopr* 07/09/2018 63.6  . LDL Calculated 07/09/2018 32  . VLDL Cholesterol 07/09/2018 11  . Cholesterol/HDL Ratio 07/09/2018 1.7  . PSA (Prostate Specific A* 07/09/2018 1.27  . Urine Albumin, Random 07/09/2018 277    ASSESSMENT: Mr. Gilbert is a 73 y.o. male presenting for consultation for abnormal CT finding of the abdomen.   Patient has no swirling sign in the left hemiabdomen. This finding is chronic. He was consulted there is a suspected small bowel obstruction but the patient is tolerating regular diet, not having nausea  or vomiting and having bowel movement and passing gas. Clinically the patient is not having any suspected bowel obstruction. Radiologically the contrast also gets into the large intestine. This pain has been constant for 2 weeks and is not exacerbated by any food intake. My differential diagnosis for his conditions are multiple. This patient can be starting to have pain from that rotation no problem of the intestine, he can be developing a gastric ulcers (PUD), he also have chronic distention of the urinary bladder and may be having symptoms from significant BPH. I will start the patient on medication for PUD such as Carafate and high-dose of PPIs in case this patient has an ulcer. I will coordinate a upper endoscopy for this patient. I will also refer the patient to urology for evaluation of possible urinary retention to see if this can be causing some of the pain to the patient. If all the work-up is negative and the pain does not improve with the new medications, will consider surgical management for evaluation of that rotational problem of the intestine. From my physical exam and imaging in the history of this patient there is no urgent need for surgical management at this moment. Will follow  minimal, further recommendations.  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 Start new medication for epigastric pain. Take it as prescribed 2. We will coordinate an upper endoscopy for evaluation of possible gastric ulcers.  3. We will send you a referral for allergy due to urinary bladder distention 4. We will also order another study for evaluation of your intestines 5. We will continue to medications for further recommendations to help with the pain.  Patient verbalized understanding, all questions were answered, and were agreeable with the plan outlined above.   This was a 60-minute encounter most of the time counseling the patient and coordinating plan of care.  Herbert Pun, MD  Electronically signed by Herbert Pun, MD

## 2018-09-18 NOTE — Anesthesia Post-op Follow-up Note (Signed)
Anesthesia QCDR form completed.        

## 2018-09-18 NOTE — Interval H&P Note (Signed)
History and Physical Interval Note:  09/18/2018 12:33 PM  Todd Whitehead  has presented today for surgery, with the diagnosis of EPIGASTRIC PAIN.  The various methods of treatment have been discussed with the patient and family. After consideration of risks, benefits and other options for treatment, the patient has consented to  Procedure(s): ESOPHAGOGASTRODUODENOSCOPY (EGD) WITH PROPOFOL (N/A) as a surgical intervention.  The patient's history has been reviewed, patient examined, no change in status, stable for surgery.  I have reviewed the patient's chart and labs.  No change in symptoms since starting PPI and carafate.  Still complains of consistently loose stool and pain.  Questions were answered to the patient's satisfaction.     [name]

## 2018-09-18 NOTE — Op Note (Signed)
[facility] Patient Name: Todd Whitehead Procedure Date: 09/18/2018 12:40 PM MRN: [id number] Account #: [id number] Date of Birth: 10-05-1945 Admit Type: Outpatient Age: 73 Room: [facility] ENDO ROOM 1 Gender: Male Note Status: Finalized Procedure:            Upper GI endoscopy Indications:          Epigastric abdominal pain Patient Profile:      Refer to note in patient chart for documentation of                        history and physical. Providers:            Eliseo Squires MD, MD Medicines:            Propofol per Anesthesia Complications:        No immediate complications. Procedure:            Pre-Anesthesia Assessment:                       - After reviewing the risks and benefits, the patient                        was deemed in satisfactory condition to undergo the                        procedure in an ambulatory setting.                       After obtaining informed consent, the endoscope was                        passed under direct vision. Throughout the procedure,                        the patient's blood pressure, pulse, and oxygen                        saturations were monitored continuously. The Endoscope                        was introduced through the mouth, and advanced to the                        second part of duodenum. The upper GI endoscopy was                        accomplished without difficulty. The patient tolerated                        the procedure well. Findings:      A small hiatal hernia was present.      Diffuse atrophic mucosa was found in the stomach. Biopsies were taken       with a cold forceps for histology.      The esophagus was normal.      The examined duodenum was normal. Impression:           - Small hiatal hernia.                       - Gastric mucosal atrophy.                       -  Normal esophagus.                       - Normal examined duodenum.                       - No specimens  collected. Recommendation:       - Await pathology results. Procedure Code(s):    --- Professional ---                       [id number], Esophagogastroduodenoscopy, flexible, transoral;                        with biopsy, single or multiple Diagnosis Code(s):    --- Professional ---                       K44.9, Diaphragmatic hernia without obstruction or                        gangrene                       K31.89, Other diseases of stomach and duodenum                       R10.13, Epigastric pain CPT copyright 2019 American Medical Association. All rights reserved. The codes documented in this report are preliminary and upon coder review may  be revised to meet current compliance requirements. Dr. Sheppard Penton, MD Eliseo Squires MD, MD 09/18/2018 1:28:58 PM This report has been signed electronically. Number of Addenda: 0 Note Initiated On: 09/18/2018 12:40 PM Estimated Blood Loss: Estimated blood loss was minimal.      [facility]

## 2018-09-18 NOTE — Anesthesia Postprocedure Evaluation (Signed)
Anesthesia Post Note  Patient: Todd Whitehead  Procedure(s) Performed: ESOPHAGOGASTRODUODENOSCOPY (EGD) WITH PROPOFOL (N/A )  Patient location during evaluation: Endoscopy Anesthesia Type: General Level of consciousness: awake and alert and oriented Pain management: pain level controlled Vital Signs Assessment: post-procedure vital signs reviewed and stable Respiratory status: spontaneous breathing Cardiovascular status: blood pressure returned to baseline Anesthetic complications: no     Last Vitals:  Vitals:   09/18/18 1338 09/18/18 1348  BP: (!) 107/59 (!) 125/56  Pulse: (!) 56 63  Resp: (!) 28 (!) 25  Temp:    SpO2: 94% 92%    Last Pain:  Vitals:   09/18/18 1348  TempSrc:   PainSc: 0-No pain                 Lashica Hannay

## 2018-09-18 NOTE — Anesthesia Preprocedure Evaluation (Addendum)
Anesthesia Evaluation  Patient identified by MRN, date of birth, ID band Patient awake    Reviewed: Allergy & Precautions, NPO status , Patient's Chart, lab work & pertinent test results  History of Anesthesia Complications Negative for: history of anesthetic complications  Airway Mallampati: II  TM Distance: >3 FB Neck ROM: Full    Dental  (+) Poor Dentition   Pulmonary neg shortness of breath, neg sleep apnea, COPD, Current Smoker,    breath sounds clear to auscultation- rhonchi (-) wheezing      Cardiovascular hypertension, Pt. on medications (-) angina+ CAD, + Past MI, + CABG (August 2017 LIM to LAD, SVG to OM1) and + Peripheral Vascular Disease  Dysrhythmias: hx of post CABG afib. + Valvular Problems/Murmurs  Rhythm:Regular Rate:Normal - Systolic murmurs and - Diastolic murmurs Echo 5/99/35: NORMAL LEFT VENTRICULAR SYSTOLIC FUNCTION WITH MODERATE LVH NORMAL RIGHT VENTRICULAR SYSTOLIC FUNCTION VALVULAR REGURGITATION: MILD MR, TRIVIAL TR NO VALVULAR STENOSIS S/P CABG    Neuro/Psych Anxiety negative neurological ROS     GI/Hepatic negative GI ROS, Neg liver ROS,   Endo/Other  negative endocrine ROSneg diabetes  Renal/GU negative Renal ROS     Musculoskeletal  (+) Arthritis ,   Abdominal (+) - obese,   Peds  Hematology negative hematology ROS (+)   Anesthesia Other Findings Past Medical History: No date: Anxiety No date: Arthritis No date: COPD (chronic obstructive pulmonary disease) (*     Comment: smoker No date: Dysrhythmia No date: Hypertension     Comment: on  no meds No date: Melanoma ([place])     Comment: Resected from Left upper arm.  EF 35-45%  Reproductive/Obstetrics                            Anesthesia Physical  Anesthesia Plan  ASA: III  Anesthesia Plan: General   Post-op Pain Management:    Induction: Intravenous  PONV Risk Score and Plan: Propofol  infusion  Airway Management Planned: Natural Airway and Nasal Cannula  Additional Equipment:   Intra-op Plan:   Post-operative Plan:   Informed Consent: I have reviewed the patients History and Physical, chart, labs and discussed the procedure including the risks, benefits and alternatives for the proposed anesthesia with the patient or authorized representative who has indicated his/her understanding and acceptance.     Dental advisory given  Plan Discussed with: CRNA and Anesthesiologist  Anesthesia Plan Comments:         Anesthesia Quick Evaluation

## 2018-09-18 NOTE — Transfer of Care (Signed)
Immediate Anesthesia Transfer of Care Note  Patient: Todd Whitehead  Procedure(s) Performed: ESOPHAGOGASTRODUODENOSCOPY (EGD) WITH PROPOFOL (N/A )  Patient Location: Endoscopy Unit  Anesthesia Type:General  Level of Consciousness: drowsy  Airway & Oxygen Therapy: Patient Spontanous Breathing and Patient connected to nasal cannula oxygen  Post-op Assessment: Report given to RN and Post -op Vital signs reviewed and stable  Post vital signs: Reviewed  Last Vitals:  Vitals Value Taken Time  BP 107/62 09/18/18 1329  Temp 36.7 C 09/18/18 1329  Pulse 58 09/18/18 1329  Resp 22 09/18/18 1329  SpO2 99 % 09/18/18 1329  Vitals shown include unvalidated device data.  Last Pain:  Vitals:   09/18/18 1328  TempSrc: Tympanic  PainSc: Asleep         Complications: No apparent anesthesia complications

## 2018-09-19 ENCOUNTER — Encounter: Payer: Self-pay | Admitting: Surgery

## 2018-09-22 LAB — SURGICAL PATHOLOGY

## 2018-09-24 ENCOUNTER — Encounter: Payer: Self-pay | Admitting: Urology

## 2018-09-24 ENCOUNTER — Ambulatory Visit: Payer: Medicare Other | Admitting: Urology

## 2018-09-24 ENCOUNTER — Other Ambulatory Visit: Payer: Self-pay

## 2018-09-24 VITALS — BP 138/67 | HR 80 | Ht 67.0 in | Wt 120.0 lb

## 2018-09-24 DIAGNOSIS — N5203 Combined arterial insufficiency and corporo-venous occlusive erectile dysfunction: Secondary | ICD-10-CM | POA: Diagnosis not present

## 2018-09-24 DIAGNOSIS — R3129 Other microscopic hematuria: Secondary | ICD-10-CM | POA: Diagnosis not present

## 2018-09-24 DIAGNOSIS — F17200 Nicotine dependence, unspecified, uncomplicated: Secondary | ICD-10-CM | POA: Diagnosis not present

## 2018-09-24 DIAGNOSIS — R339 Retention of urine, unspecified: Secondary | ICD-10-CM

## 2018-09-24 LAB — URINALYSIS, COMPLETE
Bilirubin, UA: NEGATIVE
Glucose, UA: NEGATIVE
Ketones, UA: NEGATIVE
Leukocytes,UA: NEGATIVE
Nitrite, UA: NEGATIVE
Specific Gravity, UA: 1.015 (ref 1.005–1.030)
Urobilinogen, Ur: 0.2 mg/dL (ref 0.2–1.0)
pH, UA: 6.5 (ref 5.0–7.5)

## 2018-09-24 LAB — MICROSCOPIC EXAMINATION
Bacteria, UA: NONE SEEN
WBC, UA: NONE SEEN /hpf (ref 0–5)

## 2018-09-24 LAB — BLADDER SCAN AMB NON-IMAGING

## 2018-09-24 NOTE — Progress Notes (Signed)
09/24/2018 3:01 PM   Todd Whitehead [birth year] [id number]  Referring provider: [name], MD [facility] [place],  [place] [address]  Chief Complaint  Patient presents with  . Urinary Retention    New Patient    HPI: 73 year old male referred for further evaluation of possible urinary retention/incomplete bladder emptying.  He was seen and evaluated by Dr. 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  He was noted to have a  distended bladder on CT scan incidentally without hydroureteronephrosis.  This is seen on previous scans as well.  He does have a mildly large prostate on CT as well.   Most recent PSA 1.27.  Cr 1.0 08/28/18.  He has minimal urinary symptoms today.  IPSS as below.  He does feel like he empties his bladder though his PVR is elevated today approximately 200.  Ports that he saw a urologist about 5 years ago for a large prostate.  He was in [place].  He had a rectal exam and a PSA and was told that he was fine.  He did not follow-up or never had a prostate biopsy.  He is concerned today about his testosterone level.  He reports that he is getting weaker and more fatigued.  He is wondering if he should be on testosterone supplements.  Has not had any recent testosterone checks.  He does have severe erectile dysfunction.  He reports that this started after his cardiac surgery.  He has not been able to maintain or achieve an erection.  He is not tried any PDE 5 inhibitors to date.  He does have incidental microscopic hematuria today.  He has never seen blood in his urine.  Nobody is ever told him about blood in his urine in the past.  He does have an extensive smoking history, smokes a pack and a half since his early teenage years.  He continues to smoke.    IPSS    Row Name 09/24/18 1100         International Prostate Symptom Score   How often have you had the sensation of not emptying your  bladder?  Less than 1 in 5     How often have you had to urinate less than every two hours?  Less than half the time     How often have you found you stopped and started again several times when you urinated?  Less than 1 in 5 times     How often have you found it difficult to postpone urination?  Not at All     How often have you had a weak urinary stream?  Less than 1 in 5 times     How often have you had to strain to start urination?  Less than half the time     How many times did you typically get up at night to urinate?  1 Time     Total IPSS Score  8       Quality of Life due to urinary symptoms   If you were to spend the rest of your life with your urinary condition just the way it is now how would you feel about that?  Pleased        Score:  1-7 Mild 8-19 Moderate 20-35 Severe   PMH: Past Medical History:  Diagnosis Date  . Anxiety   . Arthritis   . Colon polyp   . COPD (chronic obstructive pulmonary  disease) [facility])    smoker  . Dysrhythmia   . Glaucoma   . Heart murmur   . Hypertension    on  no meds  . Melanoma ([place])    Resected from Left upper arm.   . Myocardial infarction ([place]) 2017  . Peripheral vascular disease ([place])   . Prostate enlargement     Surgical History: Past Surgical History:  Procedure Laterality Date  . BACK SURGERY  01/19/98  . CARDIAC CATHETERIZATION N/A 08/19/2015   Procedure: Left Heart Cath and Coronary Angiography;  Surgeon: [name], MD;  Location: [place] CV LAB;  Service: Cardiovascular;  Laterality: N/A;  . CHOLECYSTECTOMY N/A 08/19/2016   Procedure: LAPAROSCOPIC CHOLECYSTECTOMY WITH INTRAOPERATIVE CHOLANGIOGRAM;  Surgeon: [name], MD;  Location: [facility] OR;  Service: General;  Laterality: N/A;  . COLON SURGERY  age 70 days old   " bowel was twisted up"  . COLONOSCOPY WITH PROPOFOL N/A 03/19/2016   Procedure: COLONOSCOPY WITH PROPOFOL;  Surgeon: [name], MD;  Location: [facility] ENDOSCOPY;  Service: Endoscopy;   Laterality: N/A;  . CORONARY ARTERY BYPASS GRAFT  2017  . ERCP N/A 08/18/2016   Procedure: ENDOSCOPIC RETROGRADE CHOLANGIOPANCREATOGRAPHY (ERCP);  Surgeon: [name], MD;  Location: [place];  Service: Gastroenterology;  Laterality: N/A;  . ESOPHAGOGASTRODUODENOSCOPY (EGD) WITH PROPOFOL N/A 09/18/2018   Procedure: ESOPHAGOGASTRODUODENOSCOPY (EGD) WITH PROPOFOL;  Surgeon: [name], DO;  Location: [facility] ENDOSCOPY;  Service: General;  Laterality: N/A;  . EYE SURGERY Left 03/26/11  . LOWER EXTREMITY ANGIOGRAPHY Left 10/03/2017   Procedure: LOWER EXTREMITY ANGIOGRAPHY;  Surgeon: [name], MD;  Location: [place] CV LAB;  Service: Cardiovascular;  Laterality: Left;  . LOWER EXTREMITY ANGIOGRAPHY Right 10/31/2017   Procedure: LOWER EXTREMITY ANGIOGRAPHY;  Surgeon: [name], MD;  Location: [place] CV LAB;  Service: Cardiovascular;  Laterality: Right;  . LUMBAR LAMINECTOMY/DECOMPRESSION MICRODISCECTOMY Right 10/02/2012   Procedure: Right Lumbar Five-Sacral One Lumbar laminotomy/Microdiskectomy;  Surgeon: [name], MD;  Location: [place] [facility];  Service: Neurosurgery;  Laterality: Right;  Right Lumbar Five-Sacral One Lumbar laminotomy/Microdiskectomy    Home Medications:  Allergies as of 09/24/2018      Reactions   Other Other (See Comments)   Cheese,butter,sour cream,cottage cheese   Poison Ivy Extract Rash   Poison Oak Extract Rash      Medication List       Accurate as of September 24, 2018 11:59 PM. If you have any questions, ask your nurse or doctor.        acetaminophen 500 MG tablet Commonly known as: TYLENOL Take 1 tablet (500 mg total) by mouth every 6 (six) hours as needed. What changed: reasons to take this   ALPRAZolam 1 MG tablet Commonly known as: XANAX Take 1 mg by mouth 3 (three) times daily.   atorvastatin 40 MG tablet Commonly known as: LIPITOR Take 40 mg by mouth daily.   brimonidine 0.2 % ophthalmic solution Commonly known as: ALPHAGAN  Place 1 drop into both eyes every morning.   busPIRone 30 MG tablet Commonly known as: BUSPAR Take 30 mg by mouth 2 (two) times daily.   clopidogrel 75 MG tablet Commonly known as: PLAVIX TK 1 T PO QD   ferrous sulfate 324 (65 Fe) MG Tbec Take 324 mg by mouth daily.   latanoprost 0.005 % ophthalmic solution Commonly known as: XALATAN Place 1 drop into both eyes at bedtime.   pantoprazole 40 MG tablet Commonly known as: PROTONIX   sucralfate 1  GM/10ML suspension Commonly known as: CARAFATE TK 10 MLS PO QID BEFORE MEALS AND NIGHTLY   tamsulosin 0.4 MG Caps capsule Commonly known as: FLOMAX Take 1 capsule (0.4 mg total) by mouth daily after breakfast.   Toprol XL 25 MG 24 hr tablet Generic drug: metoprolol succinate Take 12.5 mg by mouth daily.   traMADol 50 MG tablet Commonly known as: ULTRAM Take 50 mg by mouth 2 (two) times daily.   VITAMIN B COMPLEX PO Take 1 tablet by mouth daily.       Allergies:  Allergies  Allergen Reactions  . Other Other (See Comments)    Cheese,butter,sour cream,cottage cheese  . Poison Ivy Extract Rash  . Poison Oak Extract Rash    Family History: Family History  Problem Relation Age of Onset  . Heart attack Father   . Aneurysm Brother   . Lung cancer Brother     Social History:  reports that he has been smoking cigarettes. He has a 87.00 pack-year smoking history. He has never used smokeless tobacco. He reports that he does not drink alcohol or use drugs.  ROS: UROLOGY Frequent Urination?: No Hard to postpone urination?: No Burning/pain with urination?: No Get up at night to urinate?: No Leakage of urine?: No Urine stream starts and stops?: No Trouble starting stream?: No Do you have to strain to urinate?: No Blood in urine?: No Urinary tract infection?: No Sexually transmitted disease?: No Injury to kidneys or bladder?: No Painful intercourse?: No Weak stream?: No Erection problems?: Yes Penile pain?: No   Gastrointestinal Nausea?: No Vomiting?: No Indigestion/heartburn?: No Diarrhea?: No Constipation?: No  Constitutional Fever: No Night sweats?: No Weight loss?: No Fatigue?: No  Skin Skin rash/lesions?: No Itching?: No  Eyes Blurred vision?: No Double vision?: No  Ears/Nose/Throat Sore throat?: No Sinus problems?: No  Hematologic/Lymphatic Swollen glands?: No Easy bruising?: No  Cardiovascular Leg swelling?: No Chest pain?: No  Respiratory Cough?: No Shortness of breath?: No  Endocrine Excessive thirst?: No  Musculoskeletal Back pain?: No Joint pain?: No  Neurological Headaches?: No Dizziness?: No  Psychologic Depression?: Yes Anxiety?: Yes  Physical Exam: BP 138/67   Pulse 80   Ht 5\' 7"  (1.702 m)   Wt 120 lb (54.4 kg)   BMI 18.79 kg/m   Constitutional:  Alert and oriented, No acute distress. HEENT: [place] AT, moist mucus membranes.  Trachea midline, no masses. Cardiovascular: No clubbing, cyanosis, or edema. Respiratory: Normal respiratory effort, no increased work of breathing. GI: Abdomen is soft, nontender, nondistended, no abdominal masses GU: No CVA tenderness Skin: No rashes, bruises or suspicious lesions. Neurologic: Grossly intact, no focal deficits, moving all 4 extremities. Psychiatric: Normal mood and affect.  Laboratory Data: Lab Results  Component Value Date   WBC 6.1 11/09/2017   HGB 9.7 (L) 11/09/2017   HCT 28.9 (L) 11/09/2017   MCV 92.0 11/09/2017   PLT 158 11/09/2017    Lab Results  Component Value Date   CREATININE 0.87 11/09/2017   PSA as above  Urinalysis Results for orders placed or performed in visit on 09/24/18  Microscopic Examination   URINE  Result Value Ref Range   WBC, UA None seen 0 - 5 /hpf   RBC 3-10 (A) 0 - 2 /hpf   Epithelial Cells (non renal) 0-10 0 - 10 /hpf   Bacteria, UA None seen None seen/Few  Urinalysis, Complete  Result Value Ref Range   Specific Gravity, UA 1.015 1.005 - 1.030   pH,  UA 6.5 5.0 -  7.5   Color, UA Yellow Yellow   Appearance Ur Clear Clear   Leukocytes,UA Negative Negative   Protein,UA 2+ (A) Negative/Trace   Glucose, UA Negative Negative   Ketones, UA Negative Negative   RBC, UA 2+ (A) Negative   Bilirubin, UA Negative Negative   Urobilinogen, Ur 0.2 0.2 - 1.0 mg/dL   Nitrite, UA Negative Negative   Microscopic Examination See below:   Bladder Scan (Post Void Residual) in office  Result Value Ref Range   Scan Result 265ml     Pertinent Imaging:  Results for orders placed during the hospital encounter of 12/31/16  US RENAL   Narrative CLINICAL DATA:  73 year old diabetic male with stage 3 chronic kidney disease. Initial encounter.  EXAM: RENAL / URINARY TRACT ULTRASOUND COMPLETE  COMPARISON:  08/17/2016 CT.  FINDINGS: Right Kidney:  Length: 11.1 cm. No hydronephrosis. Small cysts measuring up to 6 mm.  Left Kidney:  Length: 11.7 cm.  No hydronephrosis.  Upper pole 1.1 cm cyst  Bladder:  Appears normal for degree of bladder distention. Bilateral ureteral jets noted.  Lobulated prostate gland. Clinical and laboratory correlation recommended.  IMPRESSION: No hydronephrosis. Bilateral small renal cysts. No renal parenchymal thinning or increased echogenicity.  Lobulated prostate gland. Clinical and laboratory correlation recommended.   Electronically Signed   By: Genia Del M.D.   On: 12/31/2016 13:33    CLINICAL DATA:  Periumbilical pain for 2 weeks. History of melanoma.  EXAM: CT ABDOMEN AND PELVIS WITH CONTRAST  TECHNIQUE: Multidetector CT imaging of the abdomen and pelvis was performed using the standard protocol following bolus administration of intravenous contrast.  CONTRAST:  37mL OMNIPAQUE IOHEXOL 300 MG/ML  SOLN  COMPARISON:  CT abdomen dated 05/31/2017.  FINDINGS: Lower chest: No acute abnormality.  Hepatobiliary: No focal liver abnormality is seen. Status post cholecystectomy. No biliary  dilatation.  Pancreas: Unremarkable. No pancreatic ductal dilatation or surrounding inflammatory changes.  Spleen: Normal in size without focal abnormality.  Adrenals/Urinary Tract: Adrenal glands are unremarkable. Stable small LEFT renal cyst. Kidneys otherwise unremarkable without suspicious mass, stone or hydronephrosis. No ureteral or bladder calculi identified. Bladder is distended, similar to previous study.  Stomach/Bowel: Again noted is whirling of the LEFT mid abdominal mesentery, as previously described, but increasingly prominent compared to earlier exams. The small bowel proximal to this portion the mesentery is mildly distended with contrast and gas relative to the more normal caliber distal small bowel in the RIGHT lower quadrant, suggesting an associated partial small bowel obstruction. Presumed transition on series 2, image 44.  Stooling gas noted within the colon. No evidence of bowel wall inflammation seen. Stomach is unremarkable, partially decompressed.  Vascular/Lymphatic: Aortic atherosclerosis. No acute appearing vascular abnormality.  Reproductive: Prostate is unremarkable.  Other: No free fluid or abscess collection seen. No free intraperitoneal air.  Musculoskeletal: Degenerative spondylosis of the scoliotic thoracolumbar spine, mild to moderate in degree. No acute or suspicious osseous finding.  IMPRESSION: 1. Again noted is swirling of the LEFT mid abdominal mesentery, as previously described, but increasingly prominent compared to earlier exams, associated with mesenteric calcifications of uncertain etiology. Findings could represent internal hernia. Findings could represent sequela of chronic mesenteritis. No associated mass to suggest underlying carcinoid or other neoplastic process. The small bowel proximal to this portion of the mesentery is mildly distended with contrast and gas relative to the distal small bowel in the RIGHT  lower quadrant, suggesting an associated partial small bowel obstruction (probable transition point on series 2, image 44).  2. No evidence of bowel wall inflammation. No free fluid or abscess collection. No free intraperitoneal air. 3. Chronic bladder distension. 4. Additional chronic/incidental findings detailed above.  Aortic Atherosclerosis (ICD10-I70.0).   Electronically Signed   By: Franki Cabot M.D.   On: 08/28/2018 12:38  Previous renal ultrasound as well as CT abdomen pelvis from 08/28/2018 imaging were personally reviewed.  This was also compared to previous multiple CT scans which in fact does reveal a fairly distended bladder on multiple occasions without hydroureteronephrosis.  He has an enlarged lobulated prostate.  There is no other GU pathology identified.  Assessment & Plan:    1. Incomplete bladder emptying Incidental distended bladder multiple CT scans likely suggestive of chronic outlet obstruction PVR is mildly elevated today around 200 Despite this, he is fairly asymptomatic and has no significant urinary complaints We discussed possible sequela/complications from chronic outlet obstruction including infections, bladder stones, upper tract damage amongst others.  He has none of these at the this time.  He is not interested in any further intervention but I recommended that we follow him fairly closely to which he is agreeable - Bladder Scan (Post Void Residual) in office  2. Microscopic hematuria Incidental microscopic hematuria today with an extensive smoking history He is relatively high risk patient thus I recommended evaluation with cystoscopy to which he is agreeable She is had multiple CT scans this will defer to light upper tract imaging in the form of CT urogram at this point in time, will likely send off cytology in light of this He is agreeable this plan - Urinalysis, Complete  3. Combined arterial insufficiency and corporo-venous occlusive erectile  dysfunction Mention today however treatment for this was not discussed Likely secondary to microvascular insult He is interested in testing for hypogonadisms- will arrange  4. Smoker Encouraged smoking cessation however he is not interested in quitting We discussed that he is at risk of bladder cancer as well as lung cancer amongst others I did offer to refer him the lung cancer screening program and he will think about this   Cystoscopy next available  Hollice Espy, MD  [facility] [address] [place], [place] [address] [phone]

## 2018-10-22 ENCOUNTER — Ambulatory Visit: Payer: Medicare Other | Admitting: Urology

## 2018-10-22 ENCOUNTER — Other Ambulatory Visit: Payer: Self-pay

## 2018-10-22 VITALS — BP 130/66 | HR 73 | Ht 67.0 in | Wt 122.0 lb

## 2018-10-22 DIAGNOSIS — N5203 Combined arterial insufficiency and corporo-venous occlusive erectile dysfunction: Secondary | ICD-10-CM

## 2018-10-22 DIAGNOSIS — R339 Retention of urine, unspecified: Secondary | ICD-10-CM

## 2018-10-22 DIAGNOSIS — R3129 Other microscopic hematuria: Secondary | ICD-10-CM

## 2018-10-22 LAB — URINALYSIS, COMPLETE
Bilirubin, UA: NEGATIVE
Glucose, UA: NEGATIVE
Ketones, UA: NEGATIVE
Leukocytes,UA: NEGATIVE
Nitrite, UA: NEGATIVE
Specific Gravity, UA: 1.02 (ref 1.005–1.030)
Urobilinogen, Ur: 0.2 mg/dL (ref 0.2–1.0)
pH, UA: 7 (ref 5.0–7.5)

## 2018-10-22 LAB — MICROSCOPIC EXAMINATION
Bacteria, UA: NONE SEEN
Epithelial Cells (non renal): NONE SEEN /hpf (ref 0–10)
WBC, UA: NONE SEEN /hpf (ref 0–5)

## 2018-10-22 NOTE — Progress Notes (Signed)
   10/22/18  CC:  Chief Complaint  Patient presents with  . Cysto    HPI: 73 year old smoker with microscopic hematuria, probable chronic outlet obstruction but is otherwise asymptomatic who presents today for cystoscopy to complete his work-up.  Notably, he did have a CT scan without delayed imaging.    See previosu note for details.    Blood pressure 130/66, pulse 73, height 5\' 7"  (1.702 m), weight 122 lb (55.3 kg). NED. A&Ox3.   No respiratory distress   Abd soft, NT, ND Normal phallus with bilateral descended testicles  Cystoscopy Procedure Note  Patient identification was confirmed, informed consent was obtained, and patient was prepped using Betadine solution.  Lidocaine jelly was administered per urethral meatus.     Pre-Procedure: - Inspection reveals a normal caliber ureteral meatus.  Procedure: The flexible cystoscope was introduced without difficulty - No urethral strictures/lesions are present. - Normal prostate mild bilobar coaptation - Elevated bladder neck (mild) - Bilateral ureteral orifices identified - Bladder mucosa  reveals no ulcers, tumors, or lesions - No bladder stones - Mild trabeculation -Large bladder capacity appreciated  Retroflexion shows no significant median lobe   Post-Procedure: - Patient tolerated the procedure well  Assessment/ Plan:  1. Incomplete bladder emptying No sequela, see previous notes for details Not interested in further intervention at this time but will continue to follow Follow-up next year with IPSS/PVR  2. Microscopic hematuria Status post negative cystoscopy today Previous CT scan of abdomen pelvis with contrast is reviewed Discussed again today that he does not have upper tract imaging of the ureters however the patient does not want have another CT scan and does not want to proceed with retrogrades in the operating room As such, we will have him return next year to repeat his urinalysis and consider repeat  imaging at that point time Urine cytology today for completeness - Urinalysis, Complete  3. Combined arterial insufficiency and corporo-venous occlusive erectile dysfunction - Testosterone   Return for PSA/ DRE, IPSS/ PVR/ UA with PA. or sooner as needed  Hollice Espy, MD

## 2018-10-23 LAB — TESTOSTERONE: Testosterone: 388 ng/dL (ref 264–916)

## 2018-10-24 ENCOUNTER — Telehealth: Payer: Self-pay | Admitting: *Deleted

## 2018-10-24 NOTE — Telephone Encounter (Addendum)
Patient informed-verbalized understanding  ----- Message from Hollice Espy, MD sent at 10/23/2018  3:39 PM EDT ----- Please let this patient know that his testosterone is normal.  Hollice Espy, MD

## 2018-10-27 ENCOUNTER — Other Ambulatory Visit: Payer: Self-pay | Admitting: Urology

## 2018-11-06 ENCOUNTER — Other Ambulatory Visit (INDEPENDENT_AMBULATORY_CARE_PROVIDER_SITE_OTHER): Payer: Self-pay | Admitting: Vascular Surgery

## 2018-11-17 ENCOUNTER — Other Ambulatory Visit: Payer: Self-pay | Admitting: Otolaryngology

## 2018-11-17 DIAGNOSIS — H608X1 Other otitis externa, right ear: Secondary | ICD-10-CM

## 2018-11-25 ENCOUNTER — Ambulatory Visit: Payer: Medicare Other

## 2018-11-26 ENCOUNTER — Ambulatory Visit
Admission: RE | Admit: 2018-11-26 | Discharge: 2018-11-26 | Disposition: A | Discharge status: Home / Self Care | Payer: Medicare Other | Source: Ambulatory Visit | Attending: Otolaryngology | Admitting: Otolaryngology

## 2018-11-26 ENCOUNTER — Other Ambulatory Visit: Payer: Self-pay

## 2018-11-26 DIAGNOSIS — H608X1 Other otitis externa, right ear: Secondary | ICD-10-CM | POA: Diagnosis present

## 2019-02-05 ENCOUNTER — Other Ambulatory Visit (INDEPENDENT_AMBULATORY_CARE_PROVIDER_SITE_OTHER): Payer: Self-pay | Admitting: Vascular Surgery

## 2019-07-31 ENCOUNTER — Telehealth: Payer: Self-pay

## 2019-07-31 NOTE — Telephone Encounter (Signed)
Unsuccessful attempt at calling patient, no voicemail available, to notify them that it is time to schedule the low dose lung cancer screening CT scan.  

## 2019-08-07 ENCOUNTER — Telehealth: Payer: Self-pay | Admitting: *Deleted

## 2019-08-07 NOTE — Telephone Encounter (Signed)
(  08/07/2019) Unable to leave message for pt to notify them that it is time to schedule annual low dose lung cancer screening CT scan. Will call back to verify information prior to the scan being scheduled [facility]

## 2019-08-24 ENCOUNTER — Telehealth: Payer: Self-pay

## 2019-08-24 NOTE — Telephone Encounter (Signed)
Unsuccessful attempt at calling patient, no voicemail available, to notify them that it is time to schedule the low dose lung cancer screening CT scan.  

## 2019-08-25 ENCOUNTER — Ambulatory Visit (INDEPENDENT_AMBULATORY_CARE_PROVIDER_SITE_OTHER): Payer: Medicare Other | Admitting: Vascular Surgery

## 2019-08-25 ENCOUNTER — Other Ambulatory Visit: Payer: Self-pay

## 2019-08-25 ENCOUNTER — Encounter: Payer: Self-pay | Admitting: *Deleted

## 2019-08-25 ENCOUNTER — Ambulatory Visit (INDEPENDENT_AMBULATORY_CARE_PROVIDER_SITE_OTHER): Payer: Medicare Other

## 2019-08-25 ENCOUNTER — Encounter (INDEPENDENT_AMBULATORY_CARE_PROVIDER_SITE_OTHER): Payer: Self-pay | Admitting: Vascular Surgery

## 2019-08-25 VITALS — BP 135/54 | HR 78 | Resp 16 | Wt 120.4 lb

## 2019-08-25 DIAGNOSIS — J449 Chronic obstructive pulmonary disease, unspecified: Secondary | ICD-10-CM

## 2019-08-25 DIAGNOSIS — I10 Essential (primary) hypertension: Secondary | ICD-10-CM

## 2019-08-25 DIAGNOSIS — E785 Hyperlipidemia, unspecified: Secondary | ICD-10-CM | POA: Diagnosis not present

## 2019-08-25 DIAGNOSIS — I739 Peripheral vascular disease, unspecified: Secondary | ICD-10-CM | POA: Diagnosis not present

## 2019-08-25 MED ORDER — CLOPIDOGREL BISULFATE 75 MG PO TABS: 75.0000 mg | ORAL_TABLET | Freq: Every day | ORAL | 6 refills | Status: DC

## 2019-08-25 NOTE — Assessment & Plan Note (Signed)
His ABIs today are normal at 1.16 on the right and 1.15 on the left with brisk triphasic waveforms and no digital pressures consistent with patent interventions and no current arterial insufficiency.  Blood flow is currently good.  Continue current medical regimen.  Plan to continue to check on an annual basis at this point.

## 2019-08-25 NOTE — Progress Notes (Signed)
MRN : [id number]  Todd Whitehead is a 74 y.o. (06/11/1945) male who presents with chief complaint of  Chief Complaint  Patient presents with  . Follow-up    ultrasound follow up  .  History of Present Illness: Patient returns today in follow up of his PAD.  He underwent bilateral lower extremity revascularization almost 2 years ago now.  He does still get some fatigue in his leg when he walks, but no significant pain.  No ulceration or infection.  His ABIs today are normal at 1.16 on the right and 1.15 on the left with brisk triphasic waveforms and no digital pressures consistent with patent interventions and no current arterial insufficiency.  Current Outpatient Medications  Medication Sig Dispense Refill  . acetaminophen (TYLENOL) 500 MG tablet Take 1 tablet (500 mg total) by mouth every 6 (six) hours as needed. (Patient taking differently: Take 500 mg by mouth every 6 (six) hours as needed for moderate pain or headache. ) 30 tablet 0  . ALPRAZolam (XANAX) 1 MG tablet Take 1 mg by mouth 3 (three) times daily.     Marland Kitchen atorvastatin (LIPITOR) 40 MG tablet Take 40 mg by mouth daily.     . brimonidine (ALPHAGAN) 0.2 % ophthalmic solution Place 1 drop into both eyes every morning.     . busPIRone (BUSPAR) 30 MG tablet Take 30 mg by mouth 2 (two) times daily.    . clopidogrel (PLAVIX) 75 MG tablet TAKE 1 TABLET BY MOUTH EVERY DAY 90 tablet 1  . ferrous sulfate 324 (65 Fe) MG TBEC Take 324 mg by mouth daily.     Marland Kitchen latanoprost (XALATAN) 0.005 % ophthalmic solution Place 1 drop into both eyes at bedtime.   2  . metoprolol succinate (TOPROL XL) 25 MG 24 hr tablet Take 12.5 mg by mouth daily.    . pantoprazole (PROTONIX) 40 MG tablet     . QUEtiapine (SEROQUEL) 25 MG tablet Take 25 mg by mouth at bedtime.    . sucralfate (CARAFATE) 1 GM/10ML suspension TK 10 MLS PO QID BEFORE MEALS AND NIGHTLY    . tamsulosin (FLOMAX) 0.4 MG CAPS capsule Take 1 capsule (0.4 mg total) by mouth daily after breakfast.  30 capsule 2  . traMADol (ULTRAM) 50 MG tablet Take 50 mg by mouth 2 (two) times daily.   2  . traZODone (DESYREL) 50 MG tablet Take 50 mg by mouth at bedtime.    Grant Ruts INHUB 100-50 MCG/DOSE AEPB Inhale 1 puff into the lungs 2 (two) times daily.    . clopidogrel (PLAVIX) 75 MG tablet Take 1 tablet (75 mg total) by mouth daily. 30 tablet 6   No current facility-administered medications for this visit.    Past Medical History:  Diagnosis Date  . Anxiety   . Arthritis   . Colon polyp   . COPD (chronic obstructive pulmonary disease) ([place])    smoker  . Dysrhythmia   . Glaucoma   . Heart murmur   . Hypertension    on  no meds  . Melanoma ([place])    Resected from Left upper arm.   . Myocardial infarction ([place]) 2017  . Peripheral vascular disease ([place])   . Prostate enlargement     Past Surgical History:  Procedure Laterality Date  . BACK SURGERY  01/19/98  . CARDIAC CATHETERIZATION N/A 08/19/2015   Procedure: Left Heart Cath and Coronary Angiography;  Surgeon: [name], MD;  Location: [facility];  Service:  Cardiovascular;  Laterality: N/A;  . CHOLECYSTECTOMY N/A 08/19/2016   Procedure: LAPAROSCOPIC CHOLECYSTECTOMY WITH INTRAOPERATIVE CHOLANGIOGRAM;  Surgeon: [name], MD;  Location: [facility];  Service: General;  Laterality: N/A;  . COLON SURGERY  age 19 days old   " bowel was twisted up"  . COLONOSCOPY WITH PROPOFOL N/A 03/19/2016   Procedure: COLONOSCOPY WITH PROPOFOL;  Surgeon: [name], MD;  Location: [facility];  Service: Endoscopy;  Laterality: N/A;  . CORONARY ARTERY BYPASS GRAFT  2017  . ERCP N/A 08/18/2016   Procedure: ENDOSCOPIC RETROGRADE CHOLANGIOPANCREATOGRAPHY (ERCP);  Surgeon: [name], MD;  Location: [facility];  Service: Gastroenterology;  Laterality: N/A;  . ESOPHAGOGASTRODUODENOSCOPY (EGD) WITH PROPOFOL N/A 09/18/2018   Procedure: ESOPHAGOGASTRODUODENOSCOPY (EGD) WITH PROPOFOL;  Surgeon: [name], DO;  Location: [facility] ENDOSCOPY;   Service: General;  Laterality: N/A;  . EYE SURGERY Left 03/26/11  . LOWER EXTREMITY ANGIOGRAPHY Left 10/03/2017   Procedure: LOWER EXTREMITY ANGIOGRAPHY;  Surgeon: [name], MD;  Location: [facility];  Service: Cardiovascular;  Laterality: Left;  . LOWER EXTREMITY ANGIOGRAPHY Right 10/31/2017   Procedure: LOWER EXTREMITY ANGIOGRAPHY;  Surgeon: [name], MD;  Location: [facility];  Service: Cardiovascular;  Laterality: Right;  . LUMBAR LAMINECTOMY/DECOMPRESSION MICRODISCECTOMY Right 10/02/2012   Procedure: Right Lumbar Five-Sacral One Lumbar laminotomy/Microdiskectomy;  Surgeon: [name], MD;  Location: [facility];  Service: Neurosurgery;  Laterality: Right;  Right Lumbar Five-Sacral One Lumbar laminotomy/Microdiskectomy     Social History   Tobacco Use  . Smoking status: Current Every Day Smoker    Packs/day: 1.50    Years: 58.00    Pack years: 87.00    Types: Cigarettes  . Smokeless tobacco: Never Used  Vaping Use  . Vaping Use: Former  Substance Use Topics  . Alcohol use: No  . Drug use: No       Family History  Problem Relation Age of Onset  . Heart attack Father   . Aneurysm Brother   . Lung cancer Brother      Allergies  Allergen Reactions  . Other Other (See Comments)    Cheese,butter,sour cream,cottage cheese  . Poison Ivy Extract Rash  . Poison Oak Extract Rash    REVIEW OF SYSTEMS(Negative unless checked)  Constitutional: [] ??Weight loss[] ??Fever[] ??Chills Cardiac:[] ??Chest pain[] ??Chest pressure[] ??Palpitations [] ??Shortness of breath when laying flat [] ??Shortness of breath at rest [x] ??Shortness of breath with exertion. Vascular: [x] ??Pain in legs with walking[] ??Pain in legsat rest[] ??Pain in legs when laying flat [x] ??Claudication [] ??Pain in feet when walking [] ??Pain in feet at rest [] ??Pain in feet when laying flat [] ??History of DVT [] ??Phlebitis [x] ??Swelling in legs  [] ??Varicose veins [] ??Non-healing ulcers Pulmonary: [] ??Uses home oxygen [] ??Productive cough[] ??Hemoptysis [] ??Wheeze [x] ??COPD [] ??Asthma Neurologic: [] ??Dizziness [] ??Blackouts [] ??Seizures [] ??History of stroke [] ??History of TIA[] ??Aphasia [] ??Temporary blindness[] ??Dysphagia [] ??Weaknessor numbness in arms [] ??Weakness or numbnessin legs Musculoskeletal: [x] ??Arthritis [] ??Joint swelling [] ??Joint pain [] ??Low back pain Hematologic:[] ??Easy bruising[] ??Easy bleeding [] ??Hypercoagulable state [] ??Anemic [] ??Hepatitis Gastrointestinal:[] ??Blood in stool[] ??Vomiting blood[] ??Gastroesophageal reflux/heartburn[x] ??Abdominal pain Genitourinary: [] ??Chronic kidney disease [] ??Difficulturination [] ??Frequenturination [] ??Burning with urination[] ??Hematuria Skin: [] ??Rashes [] ??Ulcers [] ??Wounds Psychological: [x] ??History of anxiety[] ??History of major depression.  Physical Examination  BP (!) 135/54 (BP Location: Right Arm)   Pulse 78   Resp 16   Wt 120 lb 6.4 oz (54.6 kg)   BMI 18.86 kg/m  Gen:  WD/WN, NAD Head: [place]/AT, No temporalis wasting. Ear/Nose/Throat: Hearing grossly intact, nares w/o erythema or drainage Eyes: Conjunctiva clear. Sclera non-icteric Neck: Supple.  Trachea midline Pulmonary:  Good air movement, no use of accessory muscles.  Cardiac:  RRR, no JVD Vascular:  Vessel Right Left  Radial Palpable Palpable                          PT Palpable Palpable  DP Palpable Palpable   Gastrointestinal: soft, non-tender/non-distended. No guarding/reflex.  Musculoskeletal: M/S 5/5 throughout.  No deformity or atrophy. No edema. Neurologic: Sensation grossly intact in extremities.  Symmetrical.  Speech is fluent.  Psychiatric: Judgment intact, Mood & affect appropriate for pt's clinical situation. Dermatologic: No rashes or ulcers noted.  No cellulitis or open wounds.       Labs No  results found for this or any previous visit (from the past 2160 hour(s)).  Radiology No results found.  Assessment/Plan COPD (chronic obstructive pulmonary disease) with chronic bronchitis (HCC) Continue pulmonary medications and aerosols as already ordered, these medications have been reviewed and there are no changes at this time.    Hyperlipidemia lipid control important in reducing the progression of atherosclerotic disease. Continue statin therapy  Hypertension blood pressure control important in reducing the progression of atherosclerotic disease.  Not currently requiring any medications   PVD (peripheral vascular disease) with claudication (HCC) His ABIs today are normal at 1.16 on the right and 1.15 on the left with brisk triphasic waveforms and no digital pressures consistent with patent interventions and no current arterial insufficiency.  Blood flow is currently good.  Continue current medical regimen.  Plan to continue to check on an annual basis at this point.    Leotis Pain, MD  08/25/2019 3:02 PM    This note was created with Dragon medical transcription system.  Any errors from dictation are purely unintentional

## 2019-08-25 NOTE — Assessment & Plan Note (Signed)
blood pressure control important in reducing the progression of atherosclerotic disease.  Not currently requiring any medications

## 2019-08-31 ENCOUNTER — Telehealth: Payer: Self-pay | Admitting: *Deleted

## 2019-08-31 DIAGNOSIS — Z122 Encounter for screening for malignant neoplasm of respiratory organs: Secondary | ICD-10-CM

## 2019-08-31 DIAGNOSIS — Z87891 Personal history of nicotine dependence: Secondary | ICD-10-CM

## 2019-08-31 NOTE — Telephone Encounter (Signed)
Patient has been notified that annual lung cancer screening low dose CT scan is due currently or will be in near future. Confirmed that patient is within the age range of 55-77, and asymptomatic, (no signs or symptoms of lung cancer). Patient denies illness that would prevent curative treatment for lung cancer if found. Verified smoking history, (current, 115.5 pack year). The shared decision making visit was done 07/03/16. Patient is agreeable for CT scan being scheduled.

## 2019-09-04 ENCOUNTER — Other Ambulatory Visit: Payer: Self-pay

## 2019-09-04 ENCOUNTER — Ambulatory Visit
Admission: RE | Admit: 2019-09-04 | Discharge: 2019-09-04 | Disposition: A | Discharge status: Home / Self Care | Payer: Medicare Other | Source: Ambulatory Visit | Attending: Oncology | Admitting: Oncology

## 2019-09-04 DIAGNOSIS — Z122 Encounter for screening for malignant neoplasm of respiratory organs: Secondary | ICD-10-CM

## 2019-09-04 DIAGNOSIS — Z87891 Personal history of nicotine dependence: Secondary | ICD-10-CM | POA: Insufficient documentation

## 2019-09-09 ENCOUNTER — Encounter: Payer: Self-pay | Admitting: *Deleted

## 2019-10-14 ENCOUNTER — Other Ambulatory Visit: Payer: Self-pay

## 2019-10-14 DIAGNOSIS — R339 Retention of urine, unspecified: Secondary | ICD-10-CM

## 2019-10-20 ENCOUNTER — Other Ambulatory Visit: Payer: Medicare Other

## 2019-10-20 ENCOUNTER — Encounter: Payer: Self-pay | Admitting: Urology

## 2019-10-21 NOTE — Progress Notes (Signed)
   10/22/2019 7:21 PM   Todd Whitehead [birth year] [id number]  Referring provider: [name], MD [facility] [place],  [place] [address]  Chief Complaint  Patient presents with  . Other    HPI: 74 year old male with BPH with incomplete bladder emptying, high risk hematuria and ED who presents today for follow up.   Patient stated that he woke up this morning with a headache that would not abate.  He ate breakfast and then his stomach started to hurt.  He is also exhibiting sign of rhinitis.     Assessment & Plan:    1. Headache Advised patient to get tested for COVID at a local testing site and we will need to reschedule his appointment.    Zara Council, PA-C  [facility] [address] [place], [place] [address] [phone]

## 2019-10-22 ENCOUNTER — Ambulatory Visit: Payer: Medicare Other | Admitting: Urology

## 2019-10-22 ENCOUNTER — Other Ambulatory Visit: Payer: Self-pay

## 2019-10-22 DIAGNOSIS — R3129 Other microscopic hematuria: Secondary | ICD-10-CM

## 2019-10-23 LAB — URINALYSIS, COMPLETE
Bilirubin, UA: NEGATIVE
Glucose, UA: NEGATIVE
Ketones, UA: NEGATIVE
Leukocytes,UA: NEGATIVE
Nitrite, UA: NEGATIVE
Specific Gravity, UA: 1.015 (ref 1.005–1.030)
Urobilinogen, Ur: 0.2 mg/dL (ref 0.2–1.0)
pH, UA: 6.5 (ref 5.0–7.5)

## 2019-10-23 LAB — MICROSCOPIC EXAMINATION: Bacteria, UA: NONE SEEN

## 2019-10-26 ENCOUNTER — Telehealth: Payer: Self-pay | Admitting: Family Medicine

## 2019-10-26 NOTE — Telephone Encounter (Signed)
Patient notified and voiced understanding. He states he will go today or tomorrow to get tested. He states he only had a stomach ache and he was better the next day. Appointment has been made.

## 2019-10-26 NOTE — Telephone Encounter (Signed)
-----   Message from Nori Riis, PA-C sent at 10/25/2019  8:28 PM EDT ----- Please let Mr. Sawyers know that he had microscopic blood in his urine and we need to get him back for further follow up.  Did he get tested for COVID?

## 2019-10-29 NOTE — Progress Notes (Signed)
10/30/2019 10:20 PM   Todd Whitehead [birth year] [id number]  Referring provider: [name], MD [facility] [place],  [place] [address]  Chief Complaint  Patient presents with  . Hematuria    HPI: 74 year old male with BPH with incomplete bladder emptying, high risk hematuria and ED who presents today for follow up.   BPH WITH LUTS  (prostate and/or bladder) IPSS score: 17/5  PVR: 497 mL  Previous score: 8/1   Previous PVR: 202 mL  Major complaint(s): Frequency, weak urinary stream and straining to urinate x several years.  Patient denies any modifying or aggravating factors.  Patient denies any gross hematuria, dysuria or suprapubic/flank pain.  Patient denies any fevers, chills, nausea or vomiting.   Currently taking: tamsulosin 0.4 mg daily   His has had cystoscopy with Dr. [name] noted mild bilobar coaptation, mild elevated bladder neck, mild trabeculation and a large capacity bladder in 09/2018.   He does not have a family history of PCa.   IPSS    Row Name 10/30/19 1100         International Prostate Symptom Score   How often have you had the sensation of not emptying your bladder? Less than half the time     How often have you had to urinate less than every two hours? More than half the time     How often have you found you stopped and started again several times when you urinated? Less than half the time     How often have you found it difficult to postpone urination? Not at All     How often have you had a weak urinary stream? More than half the time     How often have you had to strain to start urination? More than half the time     How many times did you typically get up at night to urinate? 1 Time     Total IPSS Score 17       Quality of Life due to urinary symptoms   If you were to spend the rest of your life with your urinary condition just the way it is now how would you feel about that? Unhappy             Score:  1-7 Mild 8-19 Moderate 20-35 Severe  High risk hematuria Smoker.  Work up completed in 09/2018 with CT's, cysto and cytology was negative.     Erectile dysfunction SHIM score: 7     Main complaint: No erections x several years Risk factors:  age, BPH, DM, HTN, HLD, CAD and  smoking No painful erections or curvatures with his erections.    No longer having spontaneous erections   SHIM    Row Name 10/30/19 1104         SHIM: Over the last 6 months:   How do you rate your confidence that you could get and keep an erection? Low     When you had erections with sexual stimulation, how often were your erections hard enough for penetration (entering your partner)? Almost Never or Never     During sexual intercourse, how often were you able to maintain your erection after you had penetrated (entered) your partner? Almost Never or Never     During sexual intercourse, how difficult was it to maintain your erection to completion of intercourse? Very Difficult     When you attempted sexual intercourse, how often was it satisfactory  for you? Almost Never or Never       SHIM Total Score   SHIM 7            Score: 1-7 Severe ED 8-11 Moderate ED 12-16 Mild-Moderate ED 17-21 Mild ED 22-25 No ED    PMH: Past Medical History:  Diagnosis Date  . Anxiety   . Arthritis   . Colon polyp   . COPD (chronic obstructive pulmonary disease) ([place])    smoker  . Dysrhythmia   . Glaucoma   . Heart murmur   . Hypertension    on  no meds  . Melanoma ([place])    Resected from Left upper arm.   . Myocardial infarction ([place]) 2017  . Peripheral vascular disease ([place])   . Prostate enlargement     Surgical History: Past Surgical History:  Procedure Laterality Date  . BACK SURGERY  01/19/98  . CARDIAC CATHETERIZATION N/A 08/19/2015   Procedure: Left Heart Cath and Coronary Angiography;  Surgeon: [name], MD;  Location: [place] [facility];  Service: Cardiovascular;   Laterality: N/A;  . CHOLECYSTECTOMY N/A 08/19/2016   Procedure: LAPAROSCOPIC CHOLECYSTECTOMY WITH INTRAOPERATIVE CHOLANGIOGRAM;  Surgeon: [name], MD;  Location: [facility];  Service: General;  Laterality: N/A;  . COLON SURGERY  age 77 days old   " bowel was twisted up"  . COLONOSCOPY WITH PROPOFOL N/A 03/19/2016   Procedure: COLONOSCOPY WITH PROPOFOL;  Surgeon: [name], MD;  Location: [facility];  Service: Endoscopy;  Laterality: N/A;  . CORONARY ARTERY BYPASS GRAFT  2017  . ERCP N/A 08/18/2016   Procedure: ENDOSCOPIC RETROGRADE CHOLANGIOPANCREATOGRAPHY (ERCP);  Surgeon: [name], MD;  Location: [place];  Service: Gastroenterology;  Laterality: N/A;  . ESOPHAGOGASTRODUODENOSCOPY (EGD) WITH PROPOFOL N/A 09/18/2018   Procedure: ESOPHAGOGASTRODUODENOSCOPY (EGD) WITH PROPOFOL;  Surgeon: [name], DO;  Location: [facility] ENDOSCOPY;  Service: General;  Laterality: N/A;  . EYE SURGERY Left 03/26/11  . LOWER EXTREMITY ANGIOGRAPHY Left 10/03/2017   Procedure: LOWER EXTREMITY ANGIOGRAPHY;  Surgeon: [name], MD;  Location: [place] [facility];  Service: Cardiovascular;  Laterality: Left;  . LOWER EXTREMITY ANGIOGRAPHY Right 10/31/2017   Procedure: LOWER EXTREMITY ANGIOGRAPHY;  Surgeon: [name], MD;  Location: [place] [facility];  Service: Cardiovascular;  Laterality: Right;  . LUMBAR LAMINECTOMY/DECOMPRESSION MICRODISCECTOMY Right 10/02/2012   Procedure: Right Lumbar Five-Sacral One Lumbar laminotomy/Microdiskectomy;  Surgeon: [name], MD;  Location: [place] [facility];  Service: Neurosurgery;  Laterality: Right;  Right Lumbar Five-Sacral One Lumbar laminotomy/Microdiskectomy    Home Medications:  Allergies as of 10/30/2019      Reactions   Other Other (See Comments)   Cheese,butter,sour cream,cottage cheese   Poison Ivy Extract Rash   Poison Oak Extract Rash      Medication List       Accurate as of October 30, 2019 11:59 PM. If you have any questions, ask your  nurse or doctor.        acetaminophen 500 MG tablet Commonly known as: TYLENOL Take 1 tablet (500 mg total) by mouth every 6 (six) hours as needed. What changed: reasons to take this   ALPRAZolam 1 MG tablet Commonly known as: XANAX Take 1 mg by mouth 3 (three) times daily.   atorvastatin 40 MG tablet Commonly known as: LIPITOR Take 40 mg by mouth daily.   brimonidine 0.2 % ophthalmic solution Commonly known as: ALPHAGAN Place 1 drop into both eyes every morning.   busPIRone 30 MG tablet Commonly  known as: BUSPAR Take 30 mg by mouth 2 (two) times daily.   citalopram 20 MG tablet Commonly known as: CELEXA Take by mouth.   clopidogrel 75 MG tablet Commonly known as: PLAVIX TAKE 1 TABLET BY MOUTH EVERY DAY   clopidogrel 75 MG tablet Commonly known as: PLAVIX Take 1 tablet (75 mg total) by mouth daily.   ferrous sulfate 324 (65 Fe) MG Tbec Take 324 mg by mouth daily.   latanoprost 0.005 % ophthalmic solution Commonly known as: XALATAN Place 1 drop into both eyes at bedtime.   pantoprazole 40 MG tablet Commonly known as: PROTONIX   QUEtiapine 25 MG tablet Commonly known as: SEROQUEL Take 25 mg by mouth at bedtime.   sucralfate 1 GM/10ML suspension Commonly known as: CARAFATE TK 10 MLS PO QID BEFORE MEALS AND NIGHTLY   tamsulosin 0.4 MG Caps capsule Commonly known as: FLOMAX Take 1 capsule (0.4 mg total) by mouth daily after breakfast.   Toprol XL 25 MG 24 hr tablet Generic drug: metoprolol succinate Take 12.5 mg by mouth daily.   traMADol 50 MG tablet Commonly known as: ULTRAM Take 50 mg by mouth 2 (two) times daily.   traZODone 50 MG tablet Commonly known as: DESYREL Take 50 mg by mouth at bedtime.   Wixela Inhub 100-50 MCG/DOSE Aepb Generic drug: Fluticasone-Salmeterol Inhale 1 puff into the lungs 2 (two) times daily.       Allergies:  Allergies  Allergen Reactions  . Other Other (See Comments)    Cheese,butter,sour cream,cottage cheese   . Poison Ivy Extract Rash  . Poison Oak Extract Rash    Family History: Family History  Problem Relation Age of Onset  . Heart attack Father   . Aneurysm Brother   . Lung cancer Brother     Social History:  reports that he has been smoking cigarettes. He has a 87.00 pack-year smoking history. He has never used smokeless tobacco. He reports that he does not drink alcohol and does not use drugs.  ROS: For pertinent review of systems please refer to history of present illness  Physical Exam: BP 130/66   Pulse 72   Ht 5\' 8"  (1.727 m)   Wt 125 lb (56.7 kg)   BMI 19.01 kg/m   Constitutional:  Well nourished. Alert and oriented, No acute distress. HEENT: [place] AT, mask in place.  Trachea midline Cardiovascular: No clubbing, cyanosis, or edema. Respiratory: Normal respiratory effort, no increased work of breathing. GI: Right inguinal hernia appreciated.   GU: No CVA tenderness.  No bladder fullness or masses.  Patient with uncircumcised phallus. Foreskin easily retracted  Urethral meatus is patent.  No penile discharge. No penile lesions or rashes. Scrotum without lesions, cysts, rashes and/or edema.  Testicles are located scrotally bilaterally. No masses are appreciated in the testicles. Left and right epididymis are normal. Rectal: Patient with  normal sphincter tone. Anus and perineum without scarring or rashes. No rectal masses are appreciated. Prostate is approximately 60 grams, flat, no nodules are appreciated. Seminal vesicles could not be palpated Skin: No rashes, bruises or suspicious lesions. Lymph: No inguinal adenopathy. Neurologic: Grossly intact, no focal deficits, moving all 4 extremities. Psychiatric: Normal mood and affect.  Laboratory Data: Lab Results  Component Value Date   WBC 6.1 11/09/2017   HGB 9.7 (L) 11/09/2017   HCT 28.9 (L) 11/09/2017   MCV 92.0 11/09/2017   PLT 158 11/09/2017    Lab Results  Component Value Date   CREATININE 1.08 10/30/2019  PSA  as above  Urinalysis Component     Latest Ref Rng & Units 10/22/2019  Specific Gravity, UA     1.005 - 1.030 1.015  pH, UA     5.0 - 7.5 6.5  Color, UA     Yellow Yellow  Appearance Ur     Clear Clear  Leukocytes,UA     Negative Negative  Protein,UA     Negative/Trace 2+ (A)  Glucose, UA     Negative Negative  Ketones, UA     Negative Negative  RBC, UA     Negative 2+ (A)  Bilirubin, UA     Negative Negative  Urobilinogen, Ur     0.2 - 1.0 mg/dL 0.2  Nitrite, UA     Negative Negative  Microscopic Examination      See below:   Component     Latest Ref Rng & Units 10/22/2019          WBC, UA     0 - 5 /hpf 0-5  RBC     0 - 2 /hpf 11-30 (A)  Epithelial Cells (non renal)      0-10  Bacteria, UA     None seen/Few None seen   Results for orders placed or performed in visit on 10/30/19  CULTURE, URINE COMPREHENSIVE   Specimen: Urine   UR  Result Value Ref Range   Urine Culture, Comprehensive Final report (A)    Organism ID, Bacteria Comment (A)   Microscopic Examination   Urine  Result Value Ref Range   WBC, UA 0-5 0 - 5 /hpf   RBC 0-2 0 - 2 /hpf   Epithelial Cells (non renal) CANCELED    Bacteria, UA None seen None seen/Few  Urinalysis, Complete  Result Value Ref Range   Specific Gravity, UA 1.015 1.005 - 1.030   pH, UA 6.0 5.0 - 7.5   Color, UA Yellow Yellow   Appearance Ur Clear Clear   Leukocytes,UA Negative Negative   Protein,UA 2+ (A) Negative/Trace   Glucose, UA Negative Negative   Ketones, UA Negative Negative   RBC, UA 1+ (A) Negative   Bilirubin, UA Negative Negative   Urobilinogen, Ur 0.2 0.2 - 1.0 mg/dL   Nitrite, UA Negative Negative   Microscopic Examination See below:   Creatinine, serum  Result Value Ref Range   Creatinine, Ser 1.08 0.76 - 1.27 mg/dL   GFR calc non Af Amer 67 >59 mL/min/1.73   GFR calc Af Amer 78 >59 mL/min/1.73  Bladder Scan (Post Void Residual) in office  Result Value Ref Range   Scan Result 497      Pertinent Imaging:  Results for orders placed during the hospital encounter of 12/31/16  US RENAL   Narrative CLINICAL DATA:  74 year old diabetic male with stage 3 chronic kidney disease. Initial encounter.  EXAM: RENAL / URINARY TRACT ULTRASOUND COMPLETE  COMPARISON:  08/17/2016 CT.  FINDINGS: Right Kidney:  Length: 11.1 cm. No hydronephrosis. Small cysts measuring up to 6 mm.  Left Kidney:  Length: 11.7 cm.  No hydronephrosis.  Upper pole 1.1 cm cyst  Bladder:  Appears normal for degree of bladder distention. Bilateral ureteral jets noted.  Lobulated prostate gland. Clinical and laboratory correlation recommended.  IMPRESSION: No hydronephrosis. Bilateral small renal cysts. No renal parenchymal thinning or increased echogenicity.  Lobulated prostate gland. Clinical and laboratory correlation recommended.   Electronically Signed   By: Genia Del M.D.   On: 12/31/2016 13:33    CLINICAL DATA:  Periumbilical pain for 2 weeks. History of melanoma.  EXAM: CT ABDOMEN AND PELVIS WITH CONTRAST  TECHNIQUE: Multidetector CT imaging of the abdomen and pelvis was performed using the standard protocol following bolus administration of intravenous contrast.  CONTRAST:  62mL OMNIPAQUE IOHEXOL 300 MG/ML  SOLN  COMPARISON:  CT abdomen dated 05/31/2017.  FINDINGS: Lower chest: No acute abnormality.  Hepatobiliary: No focal liver abnormality is seen. Status post cholecystectomy. No biliary dilatation.  Pancreas: Unremarkable. No pancreatic ductal dilatation or surrounding inflammatory changes.  Spleen: Normal in size without focal abnormality.  Adrenals/Urinary Tract: Adrenal glands are unremarkable. Stable small LEFT renal cyst. Kidneys otherwise unremarkable without suspicious mass, stone or hydronephrosis. No ureteral or bladder calculi identified. Bladder is distended, similar to previous study.  Stomach/Bowel: Again noted is whirling of  the LEFT mid abdominal mesentery, as previously described, but increasingly prominent compared to earlier exams. The small bowel proximal to this portion the mesentery is mildly distended with contrast and gas relative to the more normal caliber distal small bowel in the RIGHT lower quadrant, suggesting an associated partial small bowel obstruction. Presumed transition on series 2, image 44.  Stooling gas noted within the colon. No evidence of bowel wall inflammation seen. Stomach is unremarkable, partially decompressed.  Vascular/Lymphatic: Aortic atherosclerosis. No acute appearing vascular abnormality.  Reproductive: Prostate is unremarkable.  Other: No free fluid or abscess collection seen. No free intraperitoneal air.  Musculoskeletal: Degenerative spondylosis of the scoliotic thoracolumbar spine, mild to moderate in degree. No acute or suspicious osseous finding.  IMPRESSION: 1. Again noted is swirling of the LEFT mid abdominal mesentery, as previously described, but increasingly prominent compared to earlier exams, associated with mesenteric calcifications of uncertain etiology. Findings could represent internal hernia. Findings could represent sequela of chronic mesenteritis. No associated mass to suggest underlying carcinoid or other neoplastic process. The small bowel proximal to this portion of the mesentery is mildly distended with contrast and gas relative to the distal small bowel in the RIGHT lower quadrant, suggesting an associated partial small bowel obstruction (probable transition point on series 2, image 44). 2. No evidence of bowel wall inflammation. No free fluid or abscess collection. No free intraperitoneal air. 3. Chronic bladder distension. 4. Additional chronic/incidental findings detailed above.  Aortic Atherosclerosis (ICD10-I70.0).   Electronically Signed   By: Franki Cabot M.D.   On: 08/28/2018 12:38  Assessment & Plan:    1.  Incomplete bladder emptying Incidental distended bladder multiple CT scans likely suggestive of chronic outlet obstruction PVR is elevated today 497 mL He continues to be asymptomatic and has no significant urinary complaints I encouraged him to either be instructed in self-catheterization or have a Foley placed for bladder decompression, but he declined I did explain to him the possible risks and complications from having an elevated PVR, such as infections, bladder stones and renal damage He is not interested in any further intervention, so we willfollow him fairly closely to which he is agreeable - Bladder Scan (Post Void Residual) in office Will also check a serum creatinine at this time  2. Microscopic hematuria Incidental microscopic hematuria today with an extensive smoking history He has not had a CT urogram  Urinalysis, Complete  3. Combined arterial insufficiency and corporo-venous occlusive erectile dysfunction Will need cardiac clearance prior to initiating any PDE 5 inhibitors   Zara Council, PA-C  [facility] [address], [place], [place] [address] [phone]

## 2019-10-30 ENCOUNTER — Ambulatory Visit (INDEPENDENT_AMBULATORY_CARE_PROVIDER_SITE_OTHER): Payer: Medicare Other | Admitting: Urology

## 2019-10-30 ENCOUNTER — Other Ambulatory Visit: Payer: Self-pay

## 2019-10-30 ENCOUNTER — Encounter: Payer: Self-pay | Admitting: Urology

## 2019-10-30 VITALS — BP 130/66 | HR 72 | Ht 68.0 in | Wt 125.0 lb

## 2019-10-30 DIAGNOSIS — R339 Retention of urine, unspecified: Secondary | ICD-10-CM

## 2019-10-30 DIAGNOSIS — N529 Male erectile dysfunction, unspecified: Secondary | ICD-10-CM

## 2019-10-30 DIAGNOSIS — R3129 Other microscopic hematuria: Secondary | ICD-10-CM

## 2019-10-30 LAB — BLADDER SCAN AMB NON-IMAGING: Scan Result: 497

## 2019-10-31 LAB — URINALYSIS, COMPLETE
Bilirubin, UA: NEGATIVE
Glucose, UA: NEGATIVE
Ketones, UA: NEGATIVE
Leukocytes,UA: NEGATIVE
Nitrite, UA: NEGATIVE
Specific Gravity, UA: 1.015 (ref 1.005–1.030)
Urobilinogen, Ur: 0.2 mg/dL (ref 0.2–1.0)
pH, UA: 6 (ref 5.0–7.5)

## 2019-10-31 LAB — MICROSCOPIC EXAMINATION: Bacteria, UA: NONE SEEN

## 2019-10-31 LAB — CREATININE, SERUM
Creatinine, Ser: 1.08 mg/dL (ref 0.76–1.27)
GFR calc Af Amer: 78 mL/min/{1.73_m2} (ref >59)
GFR calc non Af Amer: 67 mL/min/{1.73_m2} (ref >59)

## 2019-11-04 LAB — CULTURE, URINE COMPREHENSIVE

## 2019-11-06 ENCOUNTER — Other Ambulatory Visit: Payer: Self-pay

## 2019-11-06 ENCOUNTER — Telehealth: Payer: Self-pay | Admitting: Family Medicine

## 2019-11-06 DIAGNOSIS — N390 Urinary tract infection, site not specified: Secondary | ICD-10-CM

## 2019-11-06 MED ORDER — AMOXICILLIN-POT CLAVULANATE 875-125 MG PO TABS: 1.0000 | ORAL_TABLET | Freq: Two times a day (BID) | ORAL | 0 refills | Status: DC

## 2019-11-06 NOTE — Telephone Encounter (Signed)
Incoming call from pt on triage line, informed pt of information below. Pt gave verbal understanding. Lab appt scheduled. UA order placed.

## 2019-11-06 NOTE — Telephone Encounter (Signed)
Unable to leave message, mailbox is full. Augmentin sent to pharmacy.

## 2019-11-06 NOTE — Telephone Encounter (Signed)
-----   Message from Nori Riis, PA-C sent at 11/06/2019  8:13 AM EDT ----- Please let Todd Whitehead know that his urine culture was positive for infection and we need for him to start Augmentin 875/125, BID x 7 days.  We then need to recheck his UA in three weeks.

## 2019-11-16 ENCOUNTER — Telehealth: Payer: Self-pay | Admitting: Urology

## 2019-11-16 NOTE — Telephone Encounter (Signed)
Patient calling the office this afternoon to check the status of the cardiac clearance to start taking medication for erectile dysfunction.   (Patient last seen by Larene Beach on 10/30/19 and her note says: 3. Combined arterial insufficiency and corporo-venous occlusive erectile dysfunction Will need cardiac clearance prior to initiating any PDE 5 inhibitors  Please advise patient.  He can be reached at [phone].

## 2019-11-17 NOTE — Telephone Encounter (Signed)
Spoke to patient and informed him that I have not heard back from the Cardiologist. I have faxed the clearance request again.

## 2019-12-01 ENCOUNTER — Telehealth: Payer: Self-pay | Admitting: Family Medicine

## 2019-12-01 ENCOUNTER — Telehealth: Payer: Self-pay | Admitting: Urology

## 2019-12-01 MED ORDER — SILDENAFIL CITRATE 20 MG PO TABS: ORAL_TABLET | ORAL | 0 refills | Status: DC

## 2019-12-01 NOTE — Telephone Encounter (Signed)
Patient notified and Sildenafil was sent to [facility].

## 2019-12-01 NOTE — Telephone Encounter (Signed)
Error

## 2019-12-01 NOTE — Telephone Encounter (Signed)
Please let Todd Whitehead know that we have received cardiac clearance for him to initiate a PDE 5 inhibitor.  As these medications can be cost prohibitive, we suggest that he check his insurance formulary and also [facility] to see whether generic Viagra or generic Cialis are covered by his pharmacy and if not which generic medication would be better for him financially according to good [facility].

## 2019-12-07 ENCOUNTER — Other Ambulatory Visit: Payer: Medicare Other

## 2019-12-07 ENCOUNTER — Other Ambulatory Visit: Payer: Self-pay

## 2019-12-07 DIAGNOSIS — N390 Urinary tract infection, site not specified: Secondary | ICD-10-CM

## 2019-12-07 LAB — URINALYSIS, COMPLETE
Bilirubin, UA: NEGATIVE
Glucose, UA: NEGATIVE
Ketones, UA: NEGATIVE
Leukocytes,UA: NEGATIVE
Nitrite, UA: NEGATIVE
Specific Gravity, UA: 1.015 (ref 1.005–1.030)
Urobilinogen, Ur: 0.2 mg/dL (ref 0.2–1.0)
pH, UA: 6 (ref 5.0–7.5)

## 2019-12-07 LAB — MICROSCOPIC EXAMINATION: Bacteria, UA: NONE SEEN

## 2019-12-08 ENCOUNTER — Telehealth: Payer: Self-pay | Admitting: Family Medicine

## 2019-12-08 DIAGNOSIS — R3129 Other microscopic hematuria: Secondary | ICD-10-CM

## 2019-12-08 NOTE — Telephone Encounter (Signed)
Unble to leave message, Mailbox is full.

## 2019-12-08 NOTE — Telephone Encounter (Signed)
-----   Message from Nori Riis, PA-C sent at 12/08/2019  7:47 AM EST ----- Please let Mr. Retz know that his urine still has micro heme.  I recommend that he have a CT urogram at this time for further evaluation.

## 2019-12-09 NOTE — Telephone Encounter (Signed)
Order in for CT Urogram

## 2019-12-09 NOTE — Telephone Encounter (Signed)
Pt returned call and would like to proceed with the CT urogram.

## 2019-12-18 ENCOUNTER — Other Ambulatory Visit: Payer: Self-pay | Admitting: Urology

## 2019-12-28 ENCOUNTER — Telehealth: Payer: Self-pay | Admitting: Urology

## 2019-12-28 NOTE — Telephone Encounter (Signed)
Would you check on the status of his CT urogram?  I ordered it on the 10th.

## 2019-12-28 NOTE — Telephone Encounter (Signed)
They have been unable to reach him His voicemail is full unable to leave a message

## 2020-02-15 ENCOUNTER — Encounter: Payer: Self-pay | Admitting: Urology

## 2020-02-24 ENCOUNTER — Telehealth: Payer: Self-pay | Admitting: *Deleted

## 2020-02-24 NOTE — Telephone Encounter (Signed)
Yeah I guess if you still want him to have it.  thanks

## 2020-02-24 NOTE — Telephone Encounter (Signed)
Do I need to put a new order it?

## 2020-02-24 NOTE — Telephone Encounter (Signed)
Pt calling stating that he was supposed to have a CT Urogram back in 11/2019 and it was never scheduled (order expired now) does pt still need this done? Please advise

## 2020-02-25 ENCOUNTER — Other Ambulatory Visit: Payer: Self-pay | Admitting: Urology

## 2020-02-25 DIAGNOSIS — R3129 Other microscopic hematuria: Secondary | ICD-10-CM

## 2020-02-25 NOTE — Progress Notes (Unsigned)
Order for CT urogram is in.

## 2020-03-14 ENCOUNTER — Ambulatory Visit
Admission: RE | Admit: 2020-03-14 | Discharge: 2020-03-14 | Disposition: A | Discharge status: Home / Self Care | Payer: Medicare Other | Source: Ambulatory Visit | Attending: Urology | Admitting: Urology

## 2020-03-14 ENCOUNTER — Other Ambulatory Visit: Payer: Self-pay

## 2020-03-14 DIAGNOSIS — R3129 Other microscopic hematuria: Secondary | ICD-10-CM | POA: Diagnosis present

## 2020-03-14 LAB — POCT I-STAT CREATININE: Creatinine, Ser: 1.1 mg/dL (ref 0.61–1.24)

## 2020-03-14 MED ORDER — IOHEXOL 300 MG/ML  SOLN
100.0000 mL | Freq: Once | INTRAMUSCULAR | Status: AC | PRN
  Administered 2020-03-14: 100 mL via INTRAVENOUS

## 2020-03-15 ENCOUNTER — Telehealth: Payer: Self-pay | Admitting: Family Medicine

## 2020-03-15 ENCOUNTER — Other Ambulatory Visit: Payer: Self-pay | Admitting: Urology

## 2020-03-15 DIAGNOSIS — K56609 Unspecified intestinal obstruction, unspecified as to partial versus complete obstruction: Secondary | ICD-10-CM

## 2020-03-15 NOTE — Telephone Encounter (Signed)
Patient notified and voiced understanding.

## 2020-03-15 NOTE — Telephone Encounter (Signed)
-----   Message from Nori Riis, PA-C sent at 03/15/2020 11:27 AM EST ----- Would you let Mr. Eckard know that I have spoken with Dr. Windell Moment regarding his CT scan results and he has recommended that I refer him on to a gastroenterologist?

## 2020-03-15 NOTE — Progress Notes (Signed)
Conversed with Dr. Windell Moment regarding CT urogram findings from yesterday.  He recommends no acute intervention at this time, but a referral to gastroenterology for further evaluation and management.  Orders in for GI referral.

## 2020-05-04 ENCOUNTER — Other Ambulatory Visit: Payer: Self-pay

## 2020-05-04 ENCOUNTER — Encounter: Payer: Self-pay | Admitting: Gastroenterology

## 2020-05-04 ENCOUNTER — Ambulatory Visit: Payer: Medicare Other | Admitting: Gastroenterology

## 2020-05-04 VITALS — BP 147/60 | HR 73 | Temp 97.7°F | Ht 68.0 in | Wt 122.0 lb

## 2020-05-04 DIAGNOSIS — R1084 Generalized abdominal pain: Secondary | ICD-10-CM | POA: Diagnosis not present

## 2020-05-04 DIAGNOSIS — R935 Abnormal findings on diagnostic imaging of other abdominal regions, including retroperitoneum: Secondary | ICD-10-CM | POA: Diagnosis not present

## 2020-05-04 NOTE — Progress Notes (Signed)
T[facility] [address]  [place]  [place], [place] [address]  Main: [phone]  Fax: [phone]   Gastroenterology Consultation  Referring Provider:     [name], MD Primary Care Physician:  [name], MD Reason for Consultation:     Small bowel obstruction        HPI:    Chief Complaint  Patient presents with  . Small Bowel Obstruction    Todd Whitehead is a 75 y.o. y/o male referred for consultation & management  by Dr. Ouida Sills, Ocie Cornfield, MD.  This patient was referred to [place] by urology, Zara Council after a CT done for hematuria work-up showed whirling of the left mid abdominal mesentery, appearing similar to multiple prior studies.  Patient denies any nausea vomiting, changes in appetite, weight loss.  However, he does report intermittent severe abdominal pain, 8/10, diffuse, sharp, nonradiating, that occurs about twice a week.  No trigger events.  Not related to food.  He states he holds his stomach, and tries to go to sleep and hours later when he wakes up, the pain is gone.  Does not have any nausea or vomiting when the pain occurs.  It appears that urology PA, did discuss the CT findings with surgeon, Dr. Windell Moment, and he recommended GI referral.  He has not seen surgery in a long time.  He did have an upper endoscopy by surgeon, Dr. Lysle Pearl in August 2020 due to abdominal pain that showed a small hiatal hernia, gastric mucosal atrophy, and was otherwise normal.  No biopsies were done.  At that time he already had CT scan findings as noted above on previous CTs and these were noted by Dr. Lysle Pearl on his H&P prior to the upper endoscopy  Last colonoscopy was by Dr. Gustavo Lah in 2018 and 8 subcentimeter polyps were removed.  3 of these showed tubular adenoma, and rest were hyperplastic.  Past Medical History:  Diagnosis Date  . Anxiety   . Arthritis   . Colon polyp   . COPD (chronic obstructive pulmonary disease) ([facility])     smoker  . Dysrhythmia   . Glaucoma   . Heart murmur   . Hypertension    on  no meds  . Melanoma ([facility])    Resected from Left upper arm.   . Myocardial infarction ([place]) 2017  . Peripheral vascular disease ([place])   . Prostate enlargement     Past Surgical History:  Procedure Laterality Date  . BACK SURGERY  01/19/98  . CARDIAC CATHETERIZATION N/A 08/19/2015   Procedure: Left Heart Cath and Coronary Angiography;  Surgeon: [name], MD;  Location: [facility];  Service: Cardiovascular;  Laterality: N/A;  . CHOLECYSTECTOMY N/A 08/19/2016   Procedure: LAPAROSCOPIC CHOLECYSTECTOMY WITH INTRAOPERATIVE CHOLANGIOGRAM;  Surgeon: [name], MD;  Location: [facility];  Service: General;  Laterality: N/A;  . COLON SURGERY  age 27 days old   " bowel was twisted up"  . COLONOSCOPY WITH PROPOFOL N/A 03/19/2016   Procedure: COLONOSCOPY WITH PROPOFOL;  Surgeon: [name], MD;  Location: [facility];  Service: Endoscopy;  Laterality: N/A;  . CORONARY ARTERY BYPASS GRAFT  2017  . ERCP N/A 08/18/2016   Procedure: ENDOSCOPIC RETROGRADE CHOLANGIOPANCREATOGRAPHY (ERCP);  Surgeon: [name], MD;  Location: [facility];  Service: Gastroenterology;  Laterality: N/A;  . ESOPHAGOGASTRODUODENOSCOPY (EGD) WITH PROPOFOL N/A 09/18/2018   Procedure: ESOPHAGOGASTRODUODENOSCOPY (EGD) WITH PROPOFOL;  Surgeon: [name], DO;  Location: [facility];  Service:  General;  Laterality: N/A;  . EYE SURGERY Left 03/26/11  . LOWER EXTREMITY ANGIOGRAPHY Left 10/03/2017   Procedure: LOWER EXTREMITY ANGIOGRAPHY;  Surgeon: [name], MD;  Location: [facility];  Service: Cardiovascular;  Laterality: Left;  . LOWER EXTREMITY ANGIOGRAPHY Right 10/31/2017   Procedure: LOWER EXTREMITY ANGIOGRAPHY;  Surgeon: [name], MD;  Location: [facility];  Service: Cardiovascular;  Laterality: Right;  . LUMBAR LAMINECTOMY/DECOMPRESSION MICRODISCECTOMY Right 10/02/2012   Procedure: Right Lumbar  Five-Sacral One Lumbar laminotomy/Microdiskectomy;  Surgeon: [name], MD;  Location: [facility];  Service: Neurosurgery;  Laterality: Right;  Right Lumbar Five-Sacral One Lumbar laminotomy/Microdiskectomy    Prior to Admission medications   Medication Sig Start Date End Date Taking? Authorizing Provider  acetaminophen (TYLENOL) 500 MG tablet Take 1 tablet (500 mg total) by mouth every 6 (six) hours as needed. Patient taking differently: Take 500 mg by mouth every 6 (six) hours as needed for moderate pain or headache. 08/20/16  Yes Waldron Session, MD  ALPRAZolam Duanne Moron) 1 MG tablet Take 1 mg by mouth 3 (three) times daily.   Yes [provider]  aspirin EC 81 MG tablet Take 81 mg by mouth daily. Swallow whole.   Yes [provider]  atorvastatin (LIPITOR) 40 MG tablet Take 40 mg by mouth daily.  05/22/17  Yes [provider]  brimonidine (ALPHAGAN) 0.2 % ophthalmic solution Place 1 drop into both eyes every morning.  02/14/16  Yes [provider]  busPIRone (BUSPAR) 30 MG tablet Take 30 mg by mouth 2 (two) times daily.   Yes [provider]  citalopram (CELEXA) 20 MG tablet Take by mouth. 10/28/19  Yes [provider]  ferrous sulfate 324 (65 Fe) MG TBEC Take 324 mg by mouth daily.  09/17/15  Yes [provider]  Fluticasone-Umeclidin-Vilant (TRELEGY ELLIPTA) 100-62.5-25 MCG/INH AEPB Inhale 1 Inhaler into the lungs. 10/12/19  Yes [provider]  latanoprost (XALATAN) 0.005 % ophthalmic solution Place 1 drop into both eyes at bedtime.  06/14/17  Yes [provider]  metoprolol succinate (TOPROL-XL) 25 MG 24 hr tablet Take 12.5 mg by mouth daily.   Yes [provider]  QUEtiapine (SEROQUEL) 25 MG tablet Take 25 mg by mouth at bedtime. 07/15/19  Yes [provider]  sildenafil (REVATIO) 20 MG tablet TAKE 2 TO 5 TABLETS AS NEEDED FOR ERECTION 12/21/19  Yes McGowan, Larene Beach A, PA-C  traMADol (ULTRAM) 50  MG tablet Take 50 mg by mouth 2 (two) times daily. 09/25/17  Yes [provider]  traZODone (DESYREL) 50 MG tablet Take 50 mg by mouth at bedtime. 06/02/19  Yes [provider]  tamsulosin (FLOMAX) 0.4 MG CAPS capsule Take 1 capsule (0.4 mg total) by mouth daily after breakfast. Patient not taking: Reported on 05/04/2020 08/20/16   Waldron Session, MD    Family History  Problem Relation Age of Onset  . Heart attack Father   . Aneurysm Brother   . Lung cancer Brother      Social History   Tobacco Use  . Smoking status: Current Every Day Smoker    Packs/day: 1.50    Years: 58.00    Pack years: 87.00    Types: Cigarettes  . Smokeless tobacco: Never Used  Vaping Use  . Vaping Use: Former  Substance Use Topics  . Alcohol use: No  . Drug use: No    Allergies as of 05/04/2020 - Review Complete 05/04/2020  Allergen Reaction Noted  .  Other Other (See Comments) 10/02/2012  . Poison ivy extract Rash 10/03/2017  . Poison oak extract Rash 10/03/2017    Review of Systems:    All systems reviewed and negative except where noted in HPI.   Physical Exam:  BP (!) 147/60   Pulse 73   Temp 97.7 F (36.5 C) (Oral)   Ht 5\' 8"  (1.727 m)   Wt 122 lb (55.3 kg)   BMI 18.55 kg/m  No LMP for male patient. Psych:  Alert and cooperative. Normal mood and affect. General:   Alert,  Well-developed, well-nourished, pleasant and cooperative in NAD Head:  Normocephalic and atraumatic. Eyes:  Sclera clear, no icterus.   Conjunctiva pink. Ears:  Normal auditory acuity. Nose:  No deformity, discharge, or lesions. Mouth:  No deformity or lesions,oropharynx pink & moist. Neck:  Supple; no masses or thyromegaly. Abdomen:  Normal bowel sounds.  No bruits.  Soft, non-tender and non-distended without masses, hepatosplenomegaly or hernias noted.  No guarding or rebound tenderness.    Msk:  Symmetrical without gross deformities. Good, equal movement & strength bilaterally. Pulses:  Normal pulses  noted. Extremities:  No clubbing or edema.  No cyanosis. Neurologic:  Alert and oriented x3;  grossly normal neurologically. Skin:  Intact without significant lesions or rashes. No jaundice. Lymph Nodes:  No significant cervical adenopathy. Psych:  Alert and cooperative. Normal mood and affect.   Labs: Normal liver enzymes December 2021 Imaging Studies: CT report reviewed  Assessment and Plan:   Fannie Alomar is a 75 y.o. 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 Windell Moment via epic chat and urology PA and they are agreeable with the referral  Patient will eventually benefit from repeat colonoscopy for history of polyps, but this is not urgent, and can wait until further surgical opinion or plans  Follow-up in 6 to 8 weeks or earlier if symptoms change  Dr Todd Whitehead  Speech recognition software was used to dictate the above note.

## 2020-07-06 ENCOUNTER — Other Ambulatory Visit: Payer: Self-pay

## 2020-07-06 ENCOUNTER — Encounter: Payer: Self-pay | Admitting: Gastroenterology

## 2020-07-06 ENCOUNTER — Telehealth: Payer: Self-pay

## 2020-07-06 ENCOUNTER — Ambulatory Visit: Payer: Medicare Other | Admitting: Gastroenterology

## 2020-07-06 ENCOUNTER — Ambulatory Visit
Admission: RE | Admit: 2020-07-06 | Discharge: 2020-07-06 | Disposition: A | Discharge status: Home / Self Care | Payer: Medicare Other | Source: Ambulatory Visit | Attending: Gastroenterology | Admitting: Gastroenterology

## 2020-07-06 VITALS — BP 143/66 | HR 82 | Temp 98.8°F | Wt 117.2 lb

## 2020-07-06 DIAGNOSIS — R634 Abnormal weight loss: Secondary | ICD-10-CM

## 2020-07-06 DIAGNOSIS — R1084 Generalized abdominal pain: Secondary | ICD-10-CM

## 2020-07-06 DIAGNOSIS — R935 Abnormal findings on diagnostic imaging of other abdominal regions, including retroperitoneum: Secondary | ICD-10-CM | POA: Diagnosis not present

## 2020-07-06 LAB — POCT I-STAT CREATININE: Creatinine, Ser: 1.1 mg/dL (ref 0.61–1.24)

## 2020-07-06 MED ORDER — IOHEXOL 300 MG/ML  SOLN
75.0000 mL | Freq: Once | INTRAMUSCULAR | Status: AC | PRN
  Administered 2020-07-06: 75 mL via INTRAVENOUS

## 2020-07-06 NOTE — Progress Notes (Signed)
Vonda Antigua, MD [address]  [place]  [place], [place] [address]  Main: [phone]  Fax: [phone]   Primary Care Physician: [name], MD  Chief complaint: Abdominal pain  HPI: Todd Whitehead is a 74 y.o. male who was previously seen for abdominal pain and due to vital signs seen on CT scan, was referred to [facility] for surgical opinion there, after discussion and agreement with Dr. Windell Moment, and his urology PA who had referred him to [place] initially after his CT scan.  Patient states he has not heard from them in regard to an appointment.  He continues to report constant achy abdominal pain, diffuse, 5 / 10, nonradiating.  He denies any nausea or vomiting.  Is able to maintain oral intake.  A progress note last year in October 2021 documents his weight to be 125 pounds, and it is 117 pounds today.  About 2 months ago, his weight was 122 pounds.  Previous history: This patient was referred to [place] by urology, Zara Council after a CT done for hematuria work-up showed whirling of the left mid abdominal mesentery, appearing similar to multiple prior studies.  Patient denies any nausea vomiting, changes in appetite, weight loss.  However, he does report intermittent severe abdominal pain, 8/10, diffuse, sharp, nonradiating, that occurs about twice a week.  No trigger events.  Not related to food.  He states he holds his stomach, and tries to go to sleep and hours later when he wakes up, the pain is gone.  Does not have any nausea or vomiting when the pain occurs.  It appears that urology PA, did discuss the CT findings with surgeon, Dr. Windell Moment, and he recommended GI referral.  He has not seen surgery in a long time.  He did have an upper endoscopy by surgeon, Dr. Lysle Pearl in August 2020 due to abdominal pain that showed a small hiatal hernia, gastric mucosal atrophy, and was otherwise normal.  No biopsies were done.  At that time he already had CT scan findings  as noted above on previous CTs and these were noted by Dr. Lysle Pearl on his H&P prior to the upper endoscopy  Last colonoscopy was by Dr. Gustavo Lah in 2018 and 8 subcentimeter polyps were removed.  3 of these showed tubular adenoma, and rest were hyperplastic.  Current Outpatient Medications  Medication Sig Dispense Refill  . acetaminophen (TYLENOL) 500 MG tablet Take 1 tablet (500 mg total) by mouth every 6 (six) hours as needed. (Patient taking differently: Take 500 mg by mouth every 6 (six) hours as needed for moderate pain or headache.) 30 tablet 0  . ALPRAZolam (XANAX) 1 MG tablet Take 1 mg by mouth 3 (three) times daily.    Marland Kitchen aspirin EC 81 MG tablet Take 81 mg by mouth daily. Swallow whole.    Marland Kitchen atorvastatin (LIPITOR) 40 MG tablet Take 40 mg by mouth daily.     . brimonidine (ALPHAGAN) 0.2 % ophthalmic solution Place 1 drop into both eyes every morning.     . busPIRone (BUSPAR) 30 MG tablet Take 30 mg by mouth 2 (two) times daily.    . citalopram (CELEXA) 20 MG tablet Take by mouth.    . ferrous sulfate 324 (65 Fe) MG TBEC Take 324 mg by mouth daily.     . Fluticasone-Umeclidin-Vilant (TRELEGY ELLIPTA) 100-62.5-25 MCG/INH AEPB Inhale 1 Inhaler into the lungs.    . latanoprost (XALATAN) 0.005 % ophthalmic solution Place 1 drop into both eyes at  bedtime.   2  . metoprolol succinate (TOPROL-XL) 25 MG 24 hr tablet Take 12.5 mg by mouth daily.    . QUEtiapine (SEROQUEL) 25 MG tablet Take 25 mg by mouth at bedtime.    . sildenafil (REVATIO) 20 MG tablet TAKE 2 TO 5 TABLETS AS NEEDED FOR ERECTION 30 tablet 3  . tamsulosin (FLOMAX) 0.4 MG CAPS capsule Take 1 capsule (0.4 mg total) by mouth daily after breakfast. 30 capsule 2  . traMADol (ULTRAM) 50 MG tablet Take 50 mg by mouth 2 (two) times daily.  2  . traZODone (DESYREL) 50 MG tablet Take 50 mg by mouth at bedtime.     No current facility-administered medications for this visit.    Allergies as of 07/06/2020 - Review Complete 05/04/2020   Allergen Reaction Noted  . Other Other (See Comments) 10/02/2012  . Poison ivy extract Rash 10/03/2017  . Poison oak extract Rash 10/03/2017    ROS:  General: Negative for anorexia, weight loss, fever, chills, fatigue, weakness. ENT: Negative for hoarseness, difficulty swallowing , nasal congestion. CV: Negative for chest pain, angina, palpitations, dyspnea on exertion, peripheral edema.  Respiratory: Negative for dyspnea at rest, dyspnea on exertion, cough, sputum, wheezing.  GI: See history of present illness. GU:  Negative for dysuria, hematuria, urinary incontinence, urinary frequency, nocturnal urination.  Endo: Negative for unusual weight change.    Physical Examination:   BP (!) 143/66   Pulse 82   Temp 98.8 F (37.1 C) (Oral)   Wt 117 lb 3.2 oz (53.2 kg)   BMI 17.82 kg/m   General: Well-nourished, well-developed in no acute distress.  Eyes: No icterus. Conjunctivae pink. Mouth: Oropharyngeal mucosa moist and pink , no lesions erythema or exudate. Neck: Supple, Trachea midline Abdomen: Bowel sounds are normal, tender to palpation bilateral lower quadrant, nondistended, no hepatosplenomegaly or masses, no abdominal bruits or hernia , no rebound or guarding.   Extremities: No lower extremity edema. No clubbing or deformities. Neuro: Alert and oriented x 3.  Grossly intact. Skin: Warm and dry, no jaundice.   Psych: Alert and cooperative, normal mood and affect.   Labs: CMP     Component Value Date/Time   NA 138 11/09/2017 0314   K 3.9 11/09/2017 0314   CL 106 11/09/2017 0314   CO2 28 11/09/2017 0314   GLUCOSE 114 (H) 11/09/2017 0314   BUN 17 11/09/2017 0314   CREATININE 1.10 03/14/2020 1424   CALCIUM 8.5 (L) 11/09/2017 0314   PROT 5.1 (L) 08/20/2016 0559   ALBUMIN 2.7 (L) 08/20/2016 0559   AST 60 (H) 08/20/2016 0559   ALT 167 (H) 08/20/2016 0559   ALKPHOS 84 08/20/2016 0559   BILITOT 0.6 08/20/2016 0559   GFRNONAA 67 10/30/2019 1424   GFRAA 78 10/30/2019  1424   Lab Results  Component Value Date   WBC 6.1 11/09/2017   HGB 9.7 (L) 11/09/2017   HCT 28.9 (L) 11/09/2017   MCV 92.0 11/09/2017   PLT 158 11/09/2017    Imaging Studies: No results found.  Assessment and Plan:   Todd Whitehead is a 75 y.o. 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 Lysle Pearl in 2020 unrevealing of etiology of his pain.  In addition  patient has had a colonoscopy within the last 10 years as well.  Please see HPI.  Patient is tolerating oral diet, and is not having any obstructive symptoms but has had about 8 pound weight loss in the last 6 to 9 months and is continuing to report diffuse abdominal pain and is holding his belly while sitting in the exam room today, but is not in any acute distress.  I will obtain repeat CT scan at this time, given ongoing symptoms, and obtain labs at this time as well  After above results, will try to obtain an appointment with surgery or other work-up as needed  Close follow-up in clinic   Dr Vonda Antigua

## 2020-07-06 NOTE — Telephone Encounter (Signed)
Referral to [facility] was faxed to [phone]. They will contact the patient when they are ready to schedule consult after being reviewed.

## 2020-07-07 LAB — COMPREHENSIVE METABOLIC PANEL
ALT: 24 IU/L (ref 0–44)
AST: 25 IU/L (ref 0–40)
Albumin/Globulin Ratio: 1.9 (ref 1.2–2.2)
Albumin: 4.1 g/dL (ref 3.7–4.7)
Alkaline Phosphatase: 109 IU/L (ref 44–121)
BUN/Creatinine Ratio: 17 (ref 10–24)
BUN: 18 mg/dL (ref 8–27)
Bilirubin Total: 0.3 mg/dL (ref 0.0–1.2)
CO2: 25 mmol/L (ref 20–29)
Calcium: 9 mg/dL (ref 8.6–10.2)
Chloride: 98 mmol/L (ref 96–106)
Creatinine, Ser: 1.05 mg/dL (ref 0.76–1.27)
Globulin, Total: 2.2 g/dL (ref 1.5–4.5)
Glucose: 107 mg/dL — ABNORMAL HIGH (ref 65–99)
Potassium: 3.6 mmol/L (ref 3.5–5.2)
Sodium: 138 mmol/L (ref 134–144)
Total Protein: 6.3 g/dL (ref 6.0–8.5)
eGFR: 74 mL/min/{1.73_m2} (ref >59)

## 2020-07-07 LAB — CBC
Hematocrit: 33.3 % — ABNORMAL LOW (ref 37.5–51.0)
Hemoglobin: 11.4 g/dL — ABNORMAL LOW (ref 13.0–17.7)
MCH: 30.8 pg (ref 26.6–33.0)
MCHC: 34.2 g/dL (ref 31.5–35.7)
MCV: 90 fL (ref 79–97)
Platelets: 165 10*3/uL (ref 150–450)
RBC: 3.7 x10E6/uL — ABNORMAL LOW (ref 4.14–5.80)
RDW: 14.5 % (ref 11.6–15.4)
WBC: 10.1 10*3/uL (ref 3.4–10.8)

## 2020-07-07 NOTE — Telephone Encounter (Signed)
Fax sent to PCP Dr Frazier Richards with [facility], copy of CT scan with note to have pt f/u with them address mucous plugging issue faxed to [phone] and [place]

## 2020-07-07 NOTE — Telephone Encounter (Signed)
Phone number [phone] to [facility]

## 2020-07-08 NOTE — Telephone Encounter (Signed)
I called [facility] to see if they have received the referral and they had received it. They told me that they will not be able to give me an exact timeline on when it will be completely reviewed and processed but assured me that it may just be a few days after a review that they will call pt to schedule

## 2020-07-15 ENCOUNTER — Telehealth: Payer: Self-pay | Admitting: Gastroenterology

## 2020-07-15 NOTE — Telephone Encounter (Signed)
Patient called to check on referral to [facility].   Has this appointment been made?

## 2020-07-15 NOTE — Telephone Encounter (Signed)
Called and got patient schedule at general surgery on 08/09/2020 at 10:20. Phone number is [phone] and address is  [address] [place], [place] [address]. Called and informed of the appointment and gave address and phone number to patient

## 2020-07-15 NOTE — Telephone Encounter (Signed)
Called and got patient schedule at general surgery on 08/09/2020 at 10:20. Phone number is [phone] and address is  [address] [place], [place] [address]

## 2020-08-23 ENCOUNTER — Ambulatory Visit (INDEPENDENT_AMBULATORY_CARE_PROVIDER_SITE_OTHER): Payer: Medicare Other | Admitting: Vascular Surgery

## 2020-08-23 ENCOUNTER — Encounter (INDEPENDENT_AMBULATORY_CARE_PROVIDER_SITE_OTHER): Payer: Medicare Other

## 2020-10-20 ENCOUNTER — Ambulatory Visit: Payer: Medicare Other | Admitting: Gastroenterology

## 2020-10-20 ENCOUNTER — Other Ambulatory Visit: Payer: Self-pay

## 2020-10-20 ENCOUNTER — Encounter: Payer: Self-pay | Admitting: Gastroenterology

## 2020-10-20 VITALS — BP 167/70 | HR 73 | Temp 98.8°F | Wt 120.8 lb

## 2020-10-20 DIAGNOSIS — D649 Anemia, unspecified: Secondary | ICD-10-CM | POA: Diagnosis not present

## 2020-10-20 DIAGNOSIS — R109 Unspecified abdominal pain: Secondary | ICD-10-CM | POA: Diagnosis not present

## 2020-10-20 DIAGNOSIS — R634 Abnormal weight loss: Secondary | ICD-10-CM

## 2020-10-20 NOTE — Progress Notes (Signed)
Melodie Bouillon, MD [address]  [address]  [place], [place] [address]  Main: [phone]  Fax: [phone]   Primary Care Physician: Lauro Regulus, MD   CC: Abdominal pain  HPI: Todd Whitehead is a 75 y.o. male here for follow-up of abdominal pain.  Patient reports ongoing dull abdominal pain, bilateral lower quadrant, not improving, constant, nonradiating.  No nausea or vomiting.  Does not improve with bowel movements.  Usually has a bowel movement every day, but even if he skips 1 to 2 days without a bowel movement, the pain does not change it does not improve after he has a bowel movement.  May have 1 day a week where her bowel movements will be loose.  No blood in stool.  Patient was seen by surgery at [facility], Dr. Charlton Amor.  Notes reviewed and they recommended repeating CT scan.  CT scan did not show any surgical indications and patient was advised to undergo GI work-up.    ROS: All ROS reviewed and negative except as per HPI   Past Medical History:  Diagnosis Date   Anxiety    Arthritis    Colon polyp    COPD (chronic obstructive pulmonary disease) (HCC)    smoker   Dysrhythmia    Glaucoma    Heart murmur    Hypertension    on  no meds   Melanoma (HCC)    Resected from Left upper arm.    Myocardial infarction [facility]) 2017   Peripheral vascular disease [facility])    Prostate enlargement     Past Surgical History:  Procedure Laterality Date   BACK SURGERY  01/19/98   CARDIAC CATHETERIZATION N/A 08/19/2015   Procedure: Left Heart Cath and Coronary Angiography;  Surgeon: Dalia Heading, MD;  Location: [facility] INVASIVE CV LAB;  Service: Cardiovascular;  Laterality: N/A;   CHOLECYSTECTOMY N/A 08/19/2016   Procedure: LAPAROSCOPIC CHOLECYSTECTOMY WITH INTRAOPERATIVE CHOLANGIOGRAM;  Surgeon: Almond Lint, MD;  [facility]: [facility] OR;  Service: General;  Laterality: N/A;   COLON SURGERY  age 28 days old   " bowel was twisted up"   COLONOSCOPY WITH PROPOFOL N/A  03/19/2016   Procedure: COLONOSCOPY WITH PROPOFOL;  Surgeon: Christena Deem, MD;  Location: [facility] ENDOSCOPY;  Service: Endoscopy;  Laterality: N/A;   CORONARY ARTERY BYPASS GRAFT  2017   ERCP N/A 08/18/2016   Procedure: ENDOSCOPIC RETROGRADE CHOLANGIOPANCREATOGRAPHY (ERCP);  Surgeon: Kerin Salen, MD;  Location: [facility] ENDOSCOPY;  Service: Gastroenterology;  Laterality: N/A;   ESOPHAGOGASTRODUODENOSCOPY (EGD) WITH PROPOFOL N/A 09/18/2018   Procedure: ESOPHAGOGASTRODUODENOSCOPY (EGD) WITH PROPOFOL;  Surgeon: Sung Amabile, DO;  Location: [facility] ENDOSCOPY;  Service: General;  Laterality: N/A;   EYE SURGERY Left 03/26/11   LOWER EXTREMITY ANGIOGRAPHY Left 10/03/2017   Procedure: LOWER EXTREMITY ANGIOGRAPHY;  Surgeon: Annice Needy, MD;  Location: [facility] INVASIVE CV LAB;  Service: Cardiovascular;  Laterality: Left;   LOWER EXTREMITY ANGIOGRAPHY Right 10/31/2017   Procedure: LOWER EXTREMITY ANGIOGRAPHY;  Surgeon: Annice Needy, MD;  Location: [facility] INVASIVE CV LAB;  Service: Cardiovascular;  Laterality: Right;   LUMBAR LAMINECTOMY/DECOMPRESSION MICRODISCECTOMY Right 10/02/2012   Procedure: Right Lumbar Five-Sacral One Lumbar laminotomy/Microdiskectomy;  Surgeon: Hewitt Shorts, MD;  Location: [facility] NEURO ORS;  Service: Neurosurgery;  Laterality: Right;  Right Lumbar Five-Sacral One Lumbar laminotomy/Microdiskectomy    Prior to Admission medications   Medication Sig Start Date End Date Taking? Authorizing Provider  acetaminophen (TYLENOL) 500 MG tablet Take 1 tablet (500 mg total) by mouth every 6 (six)  hours as needed. Patient taking differently: Take 500 mg by mouth every 6 (six) hours as needed for moderate pain or headache. 08/20/16  Yes Waldron Session, MD  ALPRAZolam Duanne Moron) 1 MG tablet Take 1 mg by mouth 3 (three) times daily.   Yes [provider]  aspirin EC 81 MG tablet Take 81 mg by mouth daily. Swallow whole.   Yes [provider]  atorvastatin (LIPITOR) 40 MG tablet Take 40 mg by mouth daily.   05/22/17  Yes [provider]  brimonidine (ALPHAGAN) 0.2 % ophthalmic solution Place 1 drop into both eyes every morning.  02/14/16  Yes [provider]  busPIRone (BUSPAR) 30 MG tablet Take 30 mg by mouth 2 (two) times daily.   Yes [provider]  citalopram (CELEXA) 20 MG tablet Take by mouth. 10/28/19  Yes [provider]  ferrous sulfate 324 (65 Fe) MG TBEC Take 324 mg by mouth daily.  09/17/15  Yes [provider]  Fluticasone-Umeclidin-Vilant (TRELEGY ELLIPTA) 100-62.5-25 MCG/INH AEPB Inhale 1 Inhaler into the lungs. 10/12/19  Yes [provider]  latanoprost (XALATAN) 0.005 % ophthalmic solution Place 1 drop into both eyes at bedtime.  06/14/17  Yes [provider]  metoprolol succinate (TOPROL-XL) 25 MG 24 hr tablet Take 12.5 mg by mouth daily.   Yes [provider]  pantoprazole (PROTONIX) 40 MG tablet Take by mouth. 09/28/20 09/28/21 Yes [provider]  QUEtiapine (SEROQUEL) 25 MG tablet Take 25 mg by mouth at bedtime. 07/15/19  Yes [provider]  sildenafil (REVATIO) 20 MG tablet TAKE 2 TO 5 TABLETS AS NEEDED FOR ERECTION 12/21/19  Yes McGowan, Larene Beach A, PA-C  tamsulosin (FLOMAX) 0.4 MG CAPS capsule Take 1 capsule (0.4 mg total) by mouth daily after breakfast. 08/20/16  Yes Waldron Session, MD  traMADol (ULTRAM) 50 MG tablet Take 50 mg by mouth 2 (two) times daily. 09/25/17  Yes [provider]  traZODone (DESYREL) 50 MG tablet Take 50 mg by mouth at bedtime. 06/02/19  Yes [provider]    Family History  Problem Relation Age of Onset   Heart attack Father    Aneurysm Brother    Lung cancer Brother      Social History   Tobacco Use   Smoking status: Every Day    Packs/day: 1.50    Years: 58.00    Pack years: 87.00    Types: Cigarettes   Smokeless tobacco: Never  Vaping Use   Vaping Use: Former  Substance Use Topics   Alcohol use: No   Drug use: No    Allergies as of  10/20/2020 - Review Complete 07/06/2020  Allergen Reaction Noted   Other Other (See Comments) 10/02/2012   Poison ivy extract Rash 10/03/2017   Poison oak extract Rash 10/03/2017    Physical Examination:  Constitutional: General:   Alert,  Well-developed, well-nourished, pleasant and cooperative in NAD BP (!) 167/70   Pulse 73   Temp 98.8 F (37.1 C) (Oral)   Wt 120 lb 12.8 oz (54.8 kg)   BMI 18.37 kg/m   Respiratory: Normal respiratory effort  Gastrointestinal:  Soft, non-tender and non-distended without masses, hepatosplenomegaly or hernias noted.  No guarding or rebound tenderness.     Cardiac: No clubbing or edema.  No cyanosis. Normal posterior tibial pedal pulses noted.  Psych:  Alert and cooperative. Normal mood and affect.  Musculoskeletal:  Normal gait. Head normocephalic, atraumatic. Symmetrical without gross deformities. 5/5 Lower extremity strength bilaterally.  Skin: Warm.  Intact without significant lesions or rashes. No jaundice.  Neck: Supple, trachea midline  Lymph: No cervical lymphadenopathy  Psych:  Alert and oriented x3, Alert and cooperative. Normal mood and affect.  Labs: CMP     Component Value Date/Time   NA 138 07/06/2020 1505   K 3.6 07/06/2020 1505   CL 98 07/06/2020 1505   CO2 25 07/06/2020 1505   GLUCOSE 107 (H) 07/06/2020 1505   GLUCOSE 114 (H) 11/09/2017 0314   BUN 18 07/06/2020 1505   CREATININE 1.10 07/06/2020 1714   CALCIUM 9.0 07/06/2020 1505   PROT 6.3 07/06/2020 1505   ALBUMIN 4.1 07/06/2020 1505   AST 25 07/06/2020 1505   ALT 24 07/06/2020 1505   ALKPHOS 109 07/06/2020 1505   BILITOT 0.3 07/06/2020 1505   GFRNONAA 67 10/30/2019 1424   GFRAA 78 10/30/2019 1424   Lab Results  Component Value Date   WBC 10.1 07/06/2020   HGB 11.4 (L) 07/06/2020   HCT 33.3 (L) 07/06/2020   MCV 90 07/06/2020   PLT 165 07/06/2020   Lipase normal, normal transaminases, alk phos and total bilirubin September 28, 2020  Imaging  Studies:  CT abdomen pelvis IMPRESSION:  1. Extensive atherosclerotic disease. 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 Previous cholecystectomy. Mild prominence common bile duct, see above  3. Probable cysts both kidneys. Negative for hydronephrosis  4. Moderate distention urinary bladder, question bladder outlet  obstruction. Prostate gland is mildly enlarged and heterogeneous   Electronically Signed by:  Maudry Diego, MD, [facility]  Electronically Signed on:  09/13/2020 8:26 AM  Assessment and Plan:   Cong Hightower is a 75 y.o. y/o male here for follow-up of abdominal pain and weight loss, initially referred to [place] due to whirl sign seen on CT scan  Patient was seen by surgery at [facility], and repeat CT scan did not show oral sign Did not recommend any surgical interventions at this time   Given ongoing abdominal pain, associated with weight loss, of unclear etiology, no recent colonoscopy, would recommend EGD and colonoscopy at this time for further evaluation of symptoms  Most recent hemoglobin is mildly low, as seen above.  August 2022 CBC also shows low hemoglobin at 10.3.  MCV is normal.  Will obtain ferritin and iron panel for further evaluation  I have discussed alternative options, risks & benefits,  which include, but are not limited to, bleeding, infection, perforation,respiratory complication & drug reaction.  The patient agrees with this plan & written consent will be obtained.         Dr Vonda Antigua

## 2020-10-21 LAB — IRON,TIBC AND FERRITIN PANEL
Ferritin: 140 ng/mL (ref 30–400)
Iron Saturation: 12 % — ABNORMAL LOW (ref 15–55)
Iron: 33 ug/dL — ABNORMAL LOW (ref 38–169)
Total Iron Binding Capacity: 265 ug/dL (ref 250–450)
UIBC: 232 ug/dL (ref 111–343)

## 2020-10-23 DIAGNOSIS — I774 Celiac artery compression syndrome: Secondary | ICD-10-CM | POA: Insufficient documentation

## 2020-10-23 DIAGNOSIS — I771 Stricture of artery: Secondary | ICD-10-CM | POA: Insufficient documentation

## 2020-10-23 NOTE — Progress Notes (Signed)
MRN : [id number]  Todd Whitehead is a 75 y.o. (1945-09-18) male who presents with chief complaint of abdominal pain.  History of Present Illness: I am asked to evaluate the patient for the complaint of abdominal pain with uncertain etiology.  The patient is followed by GI who is concerned about mesenteric ischemia.  The patient has noted approximately 30 to 40 pound weight loss as well as some nausea.  The patient does substantiate food fear, particular foods do not seem to aggravate or alleviate the symptoms.  The patient denies bloody bowel movements or diarrhea. No history of peptic ulcer disease.   No prior peripheral angiograms or vascular interventions.  The patient denies amaurosis fugax or recent TIA symptoms. There are no recent neurological changes noted. The patient denies claudication symptoms or rest pain symptoms. The patient denies history of DVT, PE or superficial thrombophlebitis. The patient denies recent episodes of angina   I have personally reviewed the CT of the abdomen and pelvis with contrast dated 07/06/2020.  This demonstrates mild to moderate atherosclerotic changes of the arterial system including the aorta.  The celiac appears to have a greater than 90% stenosis at its origin.  The superior mesenteric artery appears to be patent without hemodynamically significant stenosis and the IMA appears to be occluded.    No outpatient medications have been marked as taking for the 10/24/20 encounter (Appointment) with Delana Meyer, Dolores Lory, MD.    Past Medical History:  Diagnosis Date   Anxiety    Arthritis    Colon polyp    COPD (chronic obstructive pulmonary disease) ([place])    smoker   Dysrhythmia    Glaucoma    Heart murmur    Hypertension    on  no meds   Melanoma ([place])    Resected from Left upper arm.    Myocardial infarction [facility] 2017   Peripheral vascular disease [facility])    Prostate enlargement     Past Surgical History:  Procedure Laterality Date   BACK  SURGERY  01/19/98   CARDIAC CATHETERIZATION N/A 08/19/2015   Procedure: Left Heart Cath and Coronary Angiography;  Surgeon: [name], MD;  Location: [place] CV LAB;  Service: Cardiovascular;  Laterality: N/A;   CHOLECYSTECTOMY N/A 08/19/2016   Procedure: LAPAROSCOPIC CHOLECYSTECTOMY WITH INTRAOPERATIVE CHOLANGIOGRAM;  Surgeon: [name], MD;  Location: [place];  Service: General;  Laterality: N/A;   COLON SURGERY  age 67 days old   " bowel was twisted up"   COLONOSCOPY WITH PROPOFOL N/A 03/19/2016   Procedure: COLONOSCOPY WITH PROPOFOL;  Surgeon: [name], MD;  Location: [facility] ENDOSCOPY;  Service: Endoscopy;  Laterality: N/A;   CORONARY ARTERY BYPASS GRAFT  2017   ERCP N/A 08/18/2016   Procedure: ENDOSCOPIC RETROGRADE CHOLANGIOPANCREATOGRAPHY (ERCP);  Surgeon: [name], MD;  Location: [place];  Service: Gastroenterology;  Laterality: N/A;   ESOPHAGOGASTRODUODENOSCOPY (EGD) WITH PROPOFOL N/A 09/18/2018   Procedure: ESOPHAGOGASTRODUODENOSCOPY (EGD) WITH PROPOFOL;  Surgeon: [name], DO;  Location: [place];  Service: General;  Laterality: N/A;   EYE SURGERY Left 03/26/11   LOWER EXTREMITY ANGIOGRAPHY Left 10/03/2017   Procedure: LOWER EXTREMITY ANGIOGRAPHY;  Surgeon: [name], MD;  Location: [place] CV LAB;  Service: Cardiovascular;  Laterality: Left;   LOWER EXTREMITY ANGIOGRAPHY Right 10/31/2017   Procedure: LOWER EXTREMITY ANGIOGRAPHY;  Surgeon: [name], MD;  Location: [place] CV LAB;  Service: Cardiovascular;  Laterality: Right;   LUMBAR LAMINECTOMY/DECOMPRESSION MICRODISCECTOMY Right 10/02/2012   Procedure:  Right Lumbar Five-Sacral One Lumbar laminotomy/Microdiskectomy;  Surgeon: [name], MD;  Location: [place] NEURO ORS;  Service: Neurosurgery;  Laterality: Right;  Right Lumbar Five-Sacral One Lumbar laminotomy/Microdiskectomy    Social History Social History   Tobacco Use   Smoking status: Every Day    Packs/day: 1.50    Years:  58.00    Pack years: 87.00    Types: Cigarettes   Smokeless tobacco: Never  Vaping Use   Vaping Use: Former  Substance Use Topics   Alcohol use: No   Drug use: No    Family History Family History  Problem Relation Age of Onset   Heart attack Father    Aneurysm Brother    Lung cancer Brother     Allergies  Allergen Reactions   Other Other (See Comments)    Cheese,butter,sour cream,cottage cheese   Poison Ivy Extract Rash   Poison Oak Extract Rash     REVIEW OF SYSTEMS (Negative unless checked)  Constitutional: [] Weight loss  [] Fever  [] Chills Cardiac: [] Chest pain   [] Chest pressure   [] Palpitations   [] Shortness of breath when laying flat   [] Shortness of breath with exertion. Vascular:  [] Pain in legs with walking   [] Pain in legs at rest  [] History of DVT   [] Phlebitis   [] Swelling in legs   [] Varicose veins   [] Non-healing ulcers Pulmonary:   [] Uses home oxygen   [] Productive cough   [] Hemoptysis   [] Wheeze  [] COPD   [] Asthma Neurologic:  [] Dizziness   [] Seizures   [] History of stroke   [] History of TIA  [] Aphasia   [] Vissual changes   [] Weakness or numbness in arm   [] Weakness or numbness in leg Musculoskeletal:   [] Joint swelling   [] Joint pain   [] Low back pain Hematologic:  [] Easy bruising  [] Easy bleeding   [] Hypercoagulable state   [] Anemic Gastrointestinal:  [] Diarrhea   [] Vomiting  [] Gastroesophageal reflux/heartburn   [] Difficulty swallowing. Genitourinary:  [] Chronic kidney disease   [] Difficult urination  [] Frequent urination   [] Blood in urine Skin:  [] Rashes   [] Ulcers  Psychological:  [] History of anxiety   []  History of major depression.  Physical Examination  There were no vitals filed for this visit. There is no height or weight on file to calculate BMI. Gen: WD/WN, NAD Head: [place]/AT, No temporalis wasting.  Ear/Nose/Throat: Hearing grossly intact, nares w/o erythema or drainage Eyes: PER, EOMI, sclera nonicteric.  Neck: Supple, no masses.  No bruit  or JVD.  Pulmonary:  Good air movement, no audible wheezing, no use of accessory muscles.  Cardiac: RRR, normal S1, S2, no Murmurs. Vascular:   No abdominal bruits; no carotid bruits Vessel Right Left  Radial Palpable Palpable  Carotid Palpable Palpable  Gastrointestinal: soft, non-distended. No guarding/no peritoneal signs.  Musculoskeletal: M/S 5/5 throughout.  No visible deformity.  Neurologic: CN 2-12 intact. Pain and light touch intact in extremities.  Symmetrical.  Speech is fluent. Motor exam as listed above. Psychiatric: Judgment intact, Mood & affect appropriate for pt's clinical situation. Dermatologic: No rashes or ulcers noted.  No changes consistent with cellulitis.   CBC Lab Results  Component Value Date   WBC 10.1 07/06/2020   HGB 11.4 (L) 07/06/2020   HCT 33.3 (L) 07/06/2020   MCV 90 07/06/2020   PLT 165 07/06/2020    BMET    Component Value Date/Time   NA 138 07/06/2020 1505   K 3.6 07/06/2020 1505   CL 98 07/06/2020 1505   CO2 25 07/06/2020 1505  GLUCOSE 107 (H) 07/06/2020 1505   GLUCOSE 114 (H) 11/09/2017 0314   BUN 18 07/06/2020 1505   CREATININE 1.10 07/06/2020 1714   CALCIUM 9.0 07/06/2020 1505   GFRNONAA 67 10/30/2019 1424   GFRAA 78 10/30/2019 1424   CrCl cannot be calculated (Patient's most recent lab result is older than the maximum 21 days allowed.).  COAG Lab Results  Component Value Date   INR 0.97 11/08/2017    Radiology No results found.   Assessment/Plan 1. Celiac artery stenosis (HCC) Recommend:  The patient has evidence of severe atherosclerotic changes of the celiac artery associated with weight loss as well as abdominal pain and N/V.  This represents a high risk for bowel infarction and death.  Patient should undergo angiography of the mesenteric arteries with the hope for intervention of the celiac artery to eliminate the ischemic symptoms.    The risks and benefits as well as the alternative therapies was discussed in  detail with the patient.  All questions were answered.  Patient agrees to proceed with angiography and intervention.  The patient will follow up with me after the angiogram.   2. PVD (peripheral vascular disease) with claudication ([place])  Recommend:  The patient has evidence of atherosclerosis of the lower extremities with claudication.  The patient does not voice lifestyle limiting changes at this point in time.  Noninvasive studies do not suggest clinically significant change.  No invasive studies, angiography or surgery at this time The patient should continue walking and begin a more formal exercise program.  The patient should continue antiplatelet therapy and aggressive treatment of the lipid abnormalities  No changes in the patient's medications at this time  3. COPD (chronic obstructive pulmonary disease) with chronic bronchitis (HCC) Continue pulmonary medications and aerosols as already ordered, these medications have been reviewed and there are no changes at this time.    4. Type 2 diabetes mellitus with stage 3 chronic kidney disease, without long-term current use of insulin, unspecified whether stage 3a or 3b CKD ([place]) Continue hypoglycemic medications as already ordered, these medications have been reviewed and there are no changes at this time.  Hgb A1C to be monitored as already arranged by primary service   5. Hyperlipidemia, unspecified hyperlipidemia type Continue statin as ordered and reviewed, no changes at this time    [name], MD  10/23/2020 2:43 PM

## 2020-10-23 NOTE — H&P (View-Only) (Signed)
MRN : [id number]  Todd Whitehead is a 75 y.o. (05-06-1945) male who presents with chief complaint of abdominal pain.  History of Present Illness: I am asked to evaluate the patient for the complaint of abdominal pain with uncertain etiology.  The patient is followed by GI who is concerned about mesenteric ischemia.  The patient has noted approximately 30 to 40 pound weight loss as well as some nausea.  The patient does substantiate food fear, particular foods do not seem to aggravate or alleviate the symptoms.  The patient denies bloody bowel movements or diarrhea. No history of peptic ulcer disease.   No prior peripheral angiograms or vascular interventions.  The patient denies amaurosis fugax or recent TIA symptoms. There are no recent neurological changes noted. The patient denies claudication symptoms or rest pain symptoms. The patient denies history of DVT, PE or superficial thrombophlebitis. The patient denies recent episodes of angina   I have personally reviewed the CT of the abdomen and pelvis with contrast dated 07/06/2020.  This demonstrates mild to moderate atherosclerotic changes of the arterial system including the aorta.  The celiac appears to have a greater than 90% stenosis at its origin.  The superior mesenteric artery appears to be patent without hemodynamically significant stenosis and the IMA appears to be occluded.    No outpatient medications have been marked as taking for the 10/24/20 encounter (Appointment) with Delana Meyer, Dolores Lory, MD.    Past Medical History:  Diagnosis Date   Anxiety    Arthritis    Colon polyp    COPD (chronic obstructive pulmonary disease) ([place])    smoker   Dysrhythmia    Glaucoma    Heart murmur    Hypertension    on  no meds   Melanoma ([place])    Resected from Left upper arm.    Myocardial infarction [facility]) 2017   Peripheral vascular disease [facility])    Prostate enlargement     Past Surgical History:  Procedure Laterality Date   BACK  SURGERY  01/19/98   CARDIAC CATHETERIZATION N/A 08/19/2015   Procedure: Left Heart Cath and Coronary Angiography;  Surgeon: [name], MD;  Location: [place] [facility];  Service: Cardiovascular;  Laterality: N/A;   CHOLECYSTECTOMY N/A 08/19/2016   Procedure: LAPAROSCOPIC CHOLECYSTECTOMY WITH INTRAOPERATIVE CHOLANGIOGRAM;  Surgeon: [name], MD;  Location: [place];  Service: General;  Laterality: N/A;   COLON SURGERY  age 42 days old   " bowel was twisted up"   COLONOSCOPY WITH PROPOFOL N/A 03/19/2016   Procedure: COLONOSCOPY WITH PROPOFOL;  Surgeon: [name], MD;  Location: [facility] END[facility];  Service: Endoscopy;  Laterality: N/A;   CORONARY ARTERY BYPASS GRAFT  2017   ERCP N/A 08/18/2016   Procedure: ENDOSCOPIC RETROGRADE CHOLANGIOPANCREATOGRAPHY (ERCP);  Surgeon: [name], MD;  Location: [place];  Service: Gastroenterology;  Laterality: N/A;   ESOPHAGOGASTRODUODENOSCOPY (EGD) WITH PROPOFOL N/A 09/18/2018   Procedure: ESOPHAGOGASTRODUODENOSCOPY (EGD) WITH PROPOFOL;  Surgeon: [name], DO;  Location: [place];  Service: General;  Laterality: N/A;   EYE SURGERY Left 03/26/11   LOWER EXTREMITY ANGIOGRAPHY Left 10/03/2017   Procedure: LOWER EXTREMITY ANGIOGRAPHY;  Surgeon: [name], MD;  Location: [place] [facility];  Service: Cardiovascular;  Laterality: Left;   LOWER EXTREMITY ANGIOGRAPHY Right 10/31/2017   Procedure: LOWER EXTREMITY ANGIOGRAPHY;  Surgeon: [name], MD;  Location: [place] [facility];  Service: Cardiovascular;  Laterality: Right;   LUMBAR LAMINECTOMY/DECOMPRESSION MICRODISCECTOMY Right 10/02/2012   Procedure:  Right Lumbar Five-Sacral One Lumbar laminotomy/Microdiskectomy;  Surgeon: [name], MD;  Location: [place] [facility];  Service: Neurosurgery;  Laterality: Right;  Right Lumbar Five-Sacral One Lumbar laminotomy/Microdiskectomy    Social History Social History   Tobacco Use   Smoking status: Every Day    Packs/day: 1.50    Years:  58.00    Pack years: 87.00    Types: Cigarettes   Smokeless tobacco: Never  Vaping Use   Vaping Use: Former  Substance Use Topics   Alcohol use: No   Drug use: No    Family History Family History  Problem Relation Age of Onset   Heart attack Father    Aneurysm Brother    Lung cancer Brother     Allergies  Allergen Reactions   Other Other (See Comments)    Cheese,butter,sour cream,cottage cheese   Poison Ivy Extract Rash   Poison Oak Extract Rash     REVIEW OF SYSTEMS (Negative unless checked)  Constitutional: [] Weight loss  [] Fever  [] Chills Cardiac: [] Chest pain   [] Chest pressure   [] Palpitations   [] Shortness of breath when laying flat   [] Shortness of breath with exertion. Vascular:  [] Pain in legs with walking   [] Pain in legs at rest  [] History of DVT   [] Phlebitis   [] Swelling in legs   [] Varicose veins   [] Non-healing ulcers Pulmonary:   [] Uses home oxygen   [] Productive cough   [] Hemoptysis   [] Wheeze  [] COPD   [] Asthma Neurologic:  [] Dizziness   [] Seizures   [] History of stroke   [] History of TIA  [] Aphasia   [] Vissual changes   [] Weakness or numbness in arm   [] Weakness or numbness in leg Musculoskeletal:   [] Joint swelling   [] Joint pain   [] Low back pain Hematologic:  [] Easy bruising  [] Easy bleeding   [] Hypercoagulable state   [] Anemic Gastrointestinal:  [] Diarrhea   [] Vomiting  [] Gastroesophageal reflux/heartburn   [] Difficulty swallowing. Genitourinary:  [] Chronic kidney disease   [] Difficult urination  [] Frequent urination   [] Blood in urine Skin:  [] Rashes   [] Ulcers  Psychological:  [] History of anxiety   []  History of major depression.  Physical Examination  There were no vitals filed for this visit. There is no height or weight on file to calculate BMI. Gen: WD/WN, NAD Head: [place]/AT, No temporalis wasting.  Ear/Nose/Throat: Hearing grossly intact, nares w/o erythema or drainage Eyes: PER, EOMI, sclera nonicteric.  Neck: Supple, no masses.  No bruit  or JVD.  Pulmonary:  Good air movement, no audible wheezing, no use of accessory muscles.  Cardiac: RRR, normal S1, S2, no Murmurs. Vascular:   No abdominal bruits; no carotid bruits Vessel Right Left  Radial Palpable Palpable  Carotid Palpable Palpable  Gastrointestinal: soft, non-distended. No guarding/no peritoneal signs.  Musculoskeletal: M/S 5/5 throughout.  No visible deformity.  Neurologic: CN 2-12 intact. Pain and light touch intact in extremities.  Symmetrical.  Speech is fluent. Motor exam as listed above. Psychiatric: Judgment intact, Mood & affect appropriate for pt's clinical situation. Dermatologic: No rashes or ulcers noted.  No changes consistent with cellulitis.   CBC Lab Results  Component Value Date   WBC 10.1 07/06/2020   HGB 11.4 (L) 07/06/2020   HCT 33.3 (L) 07/06/2020   MCV 90 07/06/2020   PLT 165 07/06/2020    BMET    Component Value Date/Time   NA 138 07/06/2020 1505   K 3.6 07/06/2020 1505   CL 98 07/06/2020 1505   CO2 25 07/06/2020 1505  GLUCOSE 107 (H) 07/06/2020 1505   GLUCOSE 114 (H) 11/09/2017 0314   BUN 18 07/06/2020 1505   CREATININE 1.10 07/06/2020 1714   CALCIUM 9.0 07/06/2020 1505   GFRNONAA 67 10/30/2019 1424   GFRAA 78 10/30/2019 1424   CrCl cannot be calculated (Patient's most recent lab result is older than the maximum 21 days allowed.).  COAG Lab Results  Component Value Date   INR 0.97 11/08/2017    Radiology No results found.   Assessment/Plan 1. Celiac artery stenosis (HCC) Recommend:  The patient has evidence of severe atherosclerotic changes of the celiac artery associated with weight loss as well as abdominal pain and N/V.  This represents a high risk for bowel infarction and death.  Patient should undergo angiography of the mesenteric arteries with the hope for intervention of the celiac artery to eliminate the ischemic symptoms.    The risks and benefits as well as the alternative therapies was discussed in  detail with the patient.  All questions were answered.  Patient agrees to proceed with angiography and intervention.  The patient will follow up with me after the angiogram.   2. PVD (peripheral vascular disease) with claudication ([place])  Recommend:  The patient has evidence of atherosclerosis of the lower extremities with claudication.  The patient does not voice lifestyle limiting changes at this point in time.  Noninvasive studies do not suggest clinically significant change.  No invasive studies, angiography or surgery at this time The patient should continue walking and begin a more formal exercise program.  The patient should continue antiplatelet therapy and aggressive treatment of the lipid abnormalities  No changes in the patient's medications at this time  3. COPD (chronic obstructive pulmonary disease) with chronic bronchitis (HCC) Continue pulmonary medications and aerosols as already ordered, these medications have been reviewed and there are no changes at this time.    4. Type 2 diabetes mellitus with stage 3 chronic kidney disease, without long-term current use of insulin, unspecified whether stage 3a or 3b CKD ([place]) Continue hypoglycemic medications as already ordered, these medications have been reviewed and there are no changes at this time.  Hgb A1C to be monitored as already arranged by primary service   5. Hyperlipidemia, unspecified hyperlipidemia type Continue statin as ordered and reviewed, no changes at this time    [name], MD  10/23/2020 2:43 PM

## 2020-10-24 ENCOUNTER — Other Ambulatory Visit (INDEPENDENT_AMBULATORY_CARE_PROVIDER_SITE_OTHER): Payer: Self-pay | Admitting: Nurse Practitioner

## 2020-10-24 ENCOUNTER — Encounter (INDEPENDENT_AMBULATORY_CARE_PROVIDER_SITE_OTHER): Payer: Self-pay

## 2020-10-24 ENCOUNTER — Ambulatory Visit (INDEPENDENT_AMBULATORY_CARE_PROVIDER_SITE_OTHER): Payer: Medicare Other | Admitting: Vascular Surgery

## 2020-10-24 ENCOUNTER — Ambulatory Visit (INDEPENDENT_AMBULATORY_CARE_PROVIDER_SITE_OTHER): Payer: Medicare Other

## 2020-10-24 ENCOUNTER — Encounter (INDEPENDENT_AMBULATORY_CARE_PROVIDER_SITE_OTHER): Payer: Self-pay | Admitting: Vascular Surgery

## 2020-10-24 ENCOUNTER — Telehealth (INDEPENDENT_AMBULATORY_CARE_PROVIDER_SITE_OTHER): Payer: Self-pay

## 2020-10-24 ENCOUNTER — Other Ambulatory Visit: Payer: Self-pay

## 2020-10-24 VITALS — BP 152/80 | HR 71 | Ht 68.0 in | Wt 121.0 lb

## 2020-10-24 DIAGNOSIS — I739 Peripheral vascular disease, unspecified: Secondary | ICD-10-CM | POA: Diagnosis not present

## 2020-10-24 DIAGNOSIS — E1122 Type 2 diabetes mellitus with diabetic chronic kidney disease: Secondary | ICD-10-CM | POA: Diagnosis not present

## 2020-10-24 DIAGNOSIS — E785 Hyperlipidemia, unspecified: Secondary | ICD-10-CM

## 2020-10-24 DIAGNOSIS — J449 Chronic obstructive pulmonary disease, unspecified: Secondary | ICD-10-CM

## 2020-10-24 DIAGNOSIS — Z9889 Other specified postprocedural states: Secondary | ICD-10-CM

## 2020-10-24 DIAGNOSIS — R109 Unspecified abdominal pain: Secondary | ICD-10-CM

## 2020-10-24 DIAGNOSIS — I774 Celiac artery compression syndrome: Secondary | ICD-10-CM | POA: Diagnosis not present

## 2020-10-24 DIAGNOSIS — I771 Stricture of artery: Secondary | ICD-10-CM

## 2020-10-24 DIAGNOSIS — I700 Atherosclerosis of aorta: Secondary | ICD-10-CM

## 2020-10-24 DIAGNOSIS — N183 Chronic kidney disease, stage 3 unspecified: Secondary | ICD-10-CM

## 2020-10-24 NOTE — Telephone Encounter (Signed)
Patient is schedule for Celiac stent on 11/08/20 with Dr Delana Meyer arrival time 6:45 am at [facility]. Pre-procedure instructions were gone over with the patient and will be mailed out.

## 2020-10-25 ENCOUNTER — Encounter (INDEPENDENT_AMBULATORY_CARE_PROVIDER_SITE_OTHER): Payer: Self-pay | Admitting: Vascular Surgery

## 2020-10-26 ENCOUNTER — Telehealth: Payer: Self-pay

## 2020-10-26 NOTE — Telephone Encounter (Signed)
Patient called and left a voicemail stating he had some question for Dr.Tahiliani nurse

## 2020-10-28 NOTE — Telephone Encounter (Signed)
See result note.  

## 2020-11-08 ENCOUNTER — Other Ambulatory Visit: Payer: Self-pay

## 2020-11-08 ENCOUNTER — Encounter: Payer: Self-pay | Admitting: Vascular Surgery

## 2020-11-08 ENCOUNTER — Other Ambulatory Visit (INDEPENDENT_AMBULATORY_CARE_PROVIDER_SITE_OTHER): Payer: Self-pay | Admitting: Nurse Practitioner

## 2020-11-08 ENCOUNTER — Encounter
Admission: RE | Disposition: A | Discharge status: Home / Self Care | Payer: Self-pay | Source: Ambulatory Visit | Attending: Vascular Surgery

## 2020-11-08 ENCOUNTER — Ambulatory Visit
Admission: RE | Admit: 2020-11-08 | Discharge: 2020-11-08 | Disposition: A | Discharge status: Home / Self Care | Payer: Medicare Other | Source: Ambulatory Visit | Attending: Vascular Surgery | Admitting: Vascular Surgery

## 2020-11-08 DIAGNOSIS — E785 Hyperlipidemia, unspecified: Secondary | ICD-10-CM | POA: Diagnosis not present

## 2020-11-08 DIAGNOSIS — I771 Stricture of artery: Secondary | ICD-10-CM | POA: Insufficient documentation

## 2020-11-08 DIAGNOSIS — Z01818 Encounter for other preprocedural examination: Secondary | ICD-10-CM

## 2020-11-08 DIAGNOSIS — E1122 Type 2 diabetes mellitus with diabetic chronic kidney disease: Secondary | ICD-10-CM | POA: Diagnosis not present

## 2020-11-08 DIAGNOSIS — F17210 Nicotine dependence, cigarettes, uncomplicated: Secondary | ICD-10-CM | POA: Insufficient documentation

## 2020-11-08 DIAGNOSIS — Z8249 Family history of ischemic heart disease and other diseases of the circulatory system: Secondary | ICD-10-CM | POA: Insufficient documentation

## 2020-11-08 DIAGNOSIS — R109 Unspecified abdominal pain: Secondary | ICD-10-CM | POA: Diagnosis present

## 2020-11-08 DIAGNOSIS — K559 Vascular disorder of intestine, unspecified: Secondary | ICD-10-CM

## 2020-11-08 DIAGNOSIS — I129 Hypertensive chronic kidney disease with stage 1 through stage 4 chronic kidney disease, or unspecified chronic kidney disease: Secondary | ICD-10-CM | POA: Insufficient documentation

## 2020-11-08 DIAGNOSIS — N183 Chronic kidney disease, stage 3 unspecified: Secondary | ICD-10-CM | POA: Diagnosis not present

## 2020-11-08 DIAGNOSIS — J449 Chronic obstructive pulmonary disease, unspecified: Secondary | ICD-10-CM | POA: Diagnosis not present

## 2020-11-08 DIAGNOSIS — E1151 Type 2 diabetes mellitus with diabetic peripheral angiopathy without gangrene: Secondary | ICD-10-CM | POA: Insufficient documentation

## 2020-11-08 DIAGNOSIS — K551 Chronic vascular disorders of intestine: Secondary | ICD-10-CM

## 2020-11-08 HISTORY — PX: VISCERAL ANGIOGRAPHY: CATH118276

## 2020-11-08 LAB — BUN: BUN: 17 mg/dL (ref 8–23)

## 2020-11-08 LAB — CREATININE, SERUM
Creatinine, Ser: 1.35 mg/dL — ABNORMAL HIGH (ref 0.61–1.24)
GFR, Estimated: 55 mL/min — ABNORMAL LOW (ref >60)

## 2020-11-08 SURGERY — VISCERAL ANGIOGRAPHY
Anesthesia: Moderate Sedation

## 2020-11-08 MED ORDER — OXYCODONE HCL 5 MG PO TABS: 5.0000 mg | ORAL_TABLET | ORAL | Status: DC | PRN

## 2020-11-08 MED ORDER — IODIXANOL 320 MG/ML IV SOLN
INTRAVENOUS | Status: DC | PRN
  Administered 2020-11-08: 45 mL via INTRA_ARTERIAL

## 2020-11-08 MED ORDER — HYDROMORPHONE HCL 1 MG/ML IJ SOLN: 1.0000 mg | Freq: Once | INTRAMUSCULAR | Status: DC | PRN

## 2020-11-08 MED ORDER — SODIUM CHLORIDE 0.9 % IV SOLN: INTRAVENOUS | Status: DC

## 2020-11-08 MED ORDER — FENTANYL CITRATE (PF) 100 MCG/2ML IJ SOLN
INTRAMUSCULAR | Status: DC | PRN
  Administered 2020-11-08 (×2): 50 ug via INTRAVENOUS

## 2020-11-08 MED ORDER — ONDANSETRON HCL 4 MG/2ML IJ SOLN: 4.0000 mg | Freq: Four times a day (QID) | INTRAMUSCULAR | Status: DC | PRN

## 2020-11-08 MED ORDER — MIDAZOLAM HCL 2 MG/2ML IJ SOLN
INTRAMUSCULAR | Status: AC
  Filled 2020-11-08: qty 2

## 2020-11-08 MED ORDER — HYDRALAZINE HCL 20 MG/ML IJ SOLN: 5.0000 mg | INTRAMUSCULAR | Status: DC | PRN

## 2020-11-08 MED ORDER — SODIUM CHLORIDE 0.9 % IV SOLN: 250.0000 mL | INTRAVENOUS | Status: DC | PRN

## 2020-11-08 MED ORDER — DIPHENHYDRAMINE HCL 50 MG/ML IJ SOLN: 50.0000 mg | Freq: Once | INTRAMUSCULAR | Status: DC | PRN

## 2020-11-08 MED ORDER — METHYLPREDNISOLONE SODIUM SUCC 125 MG IJ SOLR: 125.0000 mg | Freq: Once | INTRAMUSCULAR | Status: DC | PRN

## 2020-11-08 MED ORDER — FAMOTIDINE 20 MG PO TABS: 40.0000 mg | ORAL_TABLET | Freq: Once | ORAL | Status: DC | PRN

## 2020-11-08 MED ORDER — SODIUM CHLORIDE 0.9% FLUSH: 3.0000 mL | Freq: Two times a day (BID) | INTRAVENOUS | Status: DC

## 2020-11-08 MED ORDER — HEPARIN SODIUM (PORCINE) 1000 UNIT/ML IJ SOLN
INTRAMUSCULAR | Status: DC | PRN
  Administered 2020-11-08: 4000 [IU] via INTRAVENOUS

## 2020-11-08 MED ORDER — CEFAZOLIN SODIUM-DEXTROSE 2-4 GM/100ML-% IV SOLN
INTRAVENOUS | Status: AC
  Administered 2020-11-08: 2 g via INTRAVENOUS
  Filled 2020-11-08: qty 100

## 2020-11-08 MED ORDER — MIDAZOLAM HCL 2 MG/2ML IJ SOLN
INTRAMUSCULAR | Status: DC | PRN
  Administered 2020-11-08 (×2): 1 mg via INTRAVENOUS

## 2020-11-08 MED ORDER — MORPHINE SULFATE (PF) 4 MG/ML IV SOLN
2.0000 mg | INTRAVENOUS | Status: DC | PRN
Start: 2020-11-08 — End: 2020-11-08

## 2020-11-08 MED ORDER — LABETALOL HCL 5 MG/ML IV SOLN
10.0000 mg | INTRAVENOUS | Status: DC | PRN
Start: 2020-11-08 — End: 2020-11-08

## 2020-11-08 MED ORDER — SODIUM CHLORIDE 0.9% FLUSH: 3.0000 mL | INTRAVENOUS | Status: DC | PRN

## 2020-11-08 MED ORDER — FENTANYL CITRATE PF 50 MCG/ML IJ SOSY
PREFILLED_SYRINGE | INTRAMUSCULAR | Status: AC
  Filled 2020-11-08: qty 1

## 2020-11-08 MED ORDER — CEFAZOLIN SODIUM-DEXTROSE 2-4 GM/100ML-% IV SOLN: 2.0000 g | Freq: Once | INTRAVENOUS | Status: AC

## 2020-11-08 MED ORDER — MIDAZOLAM HCL 2 MG/ML PO SYRP: 8.0000 mg | ORAL_SOLUTION | Freq: Once | ORAL | Status: DC | PRN

## 2020-11-08 MED ORDER — ACETAMINOPHEN 325 MG PO TABS: 650.0000 mg | ORAL_TABLET | ORAL | Status: DC | PRN

## 2020-11-08 MED ORDER — HEPARIN SODIUM (PORCINE) 1000 UNIT/ML IJ SOLN
INTRAMUSCULAR | Status: AC
  Filled 2020-11-08: qty 1

## 2020-11-08 SURGICAL SUPPLY — 21 items
CANNULA 5F STIFF (CANNULA) ×1 IMPLANT
CATH ANGIO 5F 80CM MHK1-H (CATHETERS) ×1 IMPLANT
CATH ANGIO 5F PIGTAIL 65CM (CATHETERS) ×1 IMPLANT
CATH BEACON 5 .035 65 C2 TIP (CATHETERS) ×1 IMPLANT
CATH MICROCATH PRGRT 2.8F 110 (CATHETERS) IMPLANT
CATH VS1 5X80 (CATHETERS) ×1 IMPLANT
CATH VS15FR (CATHETERS) ×2 IMPLANT
COVER PROBE U/S 5X48 (MISCELLANEOUS) ×1 IMPLANT
DEVICE STARCLOSE SE CLOSURE (Vascular Products) ×1 IMPLANT
DEVICE TORQUE .025-.038 (MISCELLANEOUS) ×1 IMPLANT
GLIDEWIRE ADV .035X180CM (WIRE) ×1 IMPLANT
GLIDEWIRE STIFF .35X180X3 HYDR (WIRE) ×1 IMPLANT
KIT ENCORE 26 ADVANTAGE (KITS) IMPLANT
MICROCATH PROGREAT 2.8F 110 CM (CATHETERS) ×2
PACK ANGIOGRAPHY (CUSTOM PROCEDURE TRAY) ×2 IMPLANT
SHEATH ANL2 6FRX45 HC (SHEATH) ×1 IMPLANT
SHEATH BRITE TIP 5FRX11 (SHEATH) ×1 IMPLANT
SHEATH RAABE 6FR (SHEATH) ×1 IMPLANT
SYR MEDRAD MARK 7 150ML (SYRINGE) ×1 IMPLANT
TUBING CONTRAST HIGH PRESS 72 (TUBING) ×1 IMPLANT
WIRE GUIDERIGHT .035X150 (WIRE) ×1 IMPLANT

## 2020-11-08 NOTE — Op Note (Signed)
[facility]  Percutaneous Study/Intervention Procedural Note   Date of Surgery: 11/08/2020,9:59 AM  Surgeon:Marlan Steward, Dolores Lory   Pre-operative Diagnosis: Abdominal pain, chronic mesenteric ischemia  Post-operative diagnosis:  Same  Procedure(s) Performed:  1.  Abdominal aortogram in both the AP and lateral projections  2.  Selective injection of the celiac artery  3.  Ultrasound-guided access to the right common femoral artery  4.  [employer] right common femoral   Anesthesia: Conscious sedation was administered by the interventional radiology RN under my direct supervision. IV Versed plus fentanyl were utilized. Continuous ECG, pulse oximetry and blood pressure was monitored throughout the entire procedure. Conscious sedation was administered for a total of 62 minutes.  Sheath: 6 [place] as well as a 6 [place] were used right common femoral retrograde  Contrast: 45 cc   Fluoroscopy Time: 22 minutes  Indications: Patient presents with abdominal pain of uncertain etiology.  He has had a thorough work-up and the only abnormality that has been identified is a greater than 90% stenosis of the celiac artery.  This does correlate with his symptoms as well as his findings.  Given this revascularization has been recommended.  Risks and benefits of been reviewed all questions are answered patient is agreed to proceed.  Procedure:  Joel Cowin [facility] a 75 y.o. male who was identified and appropriate procedural time out was performed.  The patient was then placed supine on the table and prepped and draped in the usual sterile fashion.  Ultrasound was used to evaluate the right common femoral artery.  It was echolucent and pulsatile indicating it is patent .  An ultrasound image was acquired for the permanent record.  A micropuncture needle was used to access the right common femoral artery under direct ultrasound guidance.  The microwire was then advanced under  fluoroscopic guidance without difficulty followed by the micro-sheath.  A 0.035 J wire was advanced without resistance and a 5Fr sheath was placed.    Pigtail catheter is advanced to approximately T10 level.  AP projection of the aorta is obtained and the visceral segment is identified.  Pigtail catheter was repositioned to just above the visceral segment and a true lateral projection is then obtained.  This identifies faint filling of the celiac artery with what appears to be a lesion consistent with CT findings greater than 90%.  5 French sheath exchanged for a 6 [place] and a stiff angled Glidewire was advanced pigtail catheter is exchanged for a V S1 catheter.  The S1 catheter fairly easily engage the ostia of the celiac and hand-injection contrast demonstrated beyond the lesion the celiac is widely patent.  Unfortunately, in spite of the fact of being able to negotiate the Glidewire into the distal splenic artery I was unable to advance any of the catheters or sheaths past the lesion and therefore I could not obtain secure access to move forward with the intervention.  Multiple different catheters and several different wires were attempted.  Before stopping the procedure I was able to once again engage the ostia with the V S1 catheter and then advance a prograde wire well into the splenic artery.  However, the lesion is so tight that even the prograde catheter would not advance over the wire.  Given this finding it would appear the logical way to proceed will be to reschedule and move forward from left upper extremity access.  Findings:   Aortogram: Diffusely diseased no hemodynamically significant lesions identified.  The SMA  is widely patent.  The celiac demonstrates greater than 90% stenosis at its origin but distal to the stenosis appears to be widely patent.   Disposition: Patient was taken to the recovery room in stable condition having tolerated the procedure well.   PLAN: Patient  will be rescheduled with plan for upper extremity access which will allow for leverage enabling [place] to treat the lesion and move forward with revascularization and stent placement.   Belenda Cruise Lachelle Rissler 11/08/2020,9:59 AM

## 2020-11-08 NOTE — Interval H&P Note (Signed)
History and Physical Interval Note:  11/08/2020 7:56 AM  Todd Whitehead  has presented today for surgery, with the diagnosis of Celiac Stent   Mesenteric Ischemia.  The various methods of treatment have been discussed with the patient and family. After consideration of risks, benefits and other options for treatment, the patient has consented to  Procedure(s): VISCERAL ANGIOGRAPHY (N/A) as a surgical intervention.  The patient's history has been reviewed, patient examined, no change in status, stable for surgery.  I have reviewed the patient's chart and labs.  Questions were answered to the patient's satisfaction.     [name]

## 2020-11-10 NOTE — Addendum Note (Signed)
Addended by: Lurlean Nanny on: 11/10/2020 02:41 PM   Modules accepted: Orders

## 2020-11-18 ENCOUNTER — Encounter: Payer: Self-pay | Admitting: Oncology

## 2020-11-18 ENCOUNTER — Inpatient Hospital Stay: Payer: Medicare Other | Attending: Oncology | Admitting: Oncology

## 2020-11-18 ENCOUNTER — Inpatient Hospital Stay: Payer: Medicare Other

## 2020-11-18 ENCOUNTER — Other Ambulatory Visit: Payer: Self-pay

## 2020-11-18 VITALS — BP 148/88 | HR 67 | Resp 16 | Wt 120.0 lb

## 2020-11-18 DIAGNOSIS — D649 Anemia, unspecified: Secondary | ICD-10-CM | POA: Diagnosis not present

## 2020-11-18 DIAGNOSIS — I10 Essential (primary) hypertension: Secondary | ICD-10-CM | POA: Insufficient documentation

## 2020-11-18 DIAGNOSIS — F17210 Nicotine dependence, cigarettes, uncomplicated: Secondary | ICD-10-CM | POA: Insufficient documentation

## 2020-11-18 DIAGNOSIS — E538 Deficiency of other specified B group vitamins: Secondary | ICD-10-CM | POA: Insufficient documentation

## 2020-11-18 DIAGNOSIS — Z801 Family history of malignant neoplasm of trachea, bronchus and lung: Secondary | ICD-10-CM | POA: Insufficient documentation

## 2020-11-18 HISTORY — DX: Deficiency of other specified B group vitamins: E53.8

## 2020-11-18 LAB — CBC WITH DIFFERENTIAL/PLATELET
Abs Immature Granulocytes: 0.04 10*3/uL (ref 0.00–0.07)
Basophils Absolute: 0 10*3/uL (ref 0.0–0.1)
Basophils Relative: 1 %
Eosinophils Absolute: 0 10*3/uL (ref 0.0–0.5)
Eosinophils Relative: 1 %
HCT: 28.7 % — ABNORMAL LOW (ref 39.0–52.0)
Hemoglobin: 9.8 g/dL — ABNORMAL LOW (ref 13.0–17.0)
Immature Granulocytes: 1 %
Lymphocytes Relative: 28 %
Lymphs Abs: 1.6 10*3/uL (ref 0.7–4.0)
MCH: 30.2 pg (ref 26.0–34.0)
MCHC: 34.1 g/dL (ref 30.0–36.0)
MCV: 88.3 fL (ref 80.0–100.0)
Monocytes Absolute: 0.4 10*3/uL (ref 0.1–1.0)
Monocytes Relative: 7 %
Neutro Abs: 3.6 10*3/uL (ref 1.7–7.7)
Neutrophils Relative %: 62 %
Platelets: 343 10*3/uL (ref 150–400)
RBC: 3.25 MIL/uL — ABNORMAL LOW (ref 4.22–5.81)
RDW: 14.6 % (ref 11.5–15.5)
WBC: 5.7 10*3/uL (ref 4.0–10.5)
nRBC: 0 % (ref 0.0–0.2)

## 2020-11-18 LAB — COMPREHENSIVE METABOLIC PANEL
ALT: 17 U/L (ref 0–44)
AST: 29 U/L (ref 15–41)
Albumin: 3.1 g/dL — ABNORMAL LOW (ref 3.5–5.0)
Alkaline Phosphatase: 88 U/L (ref 38–126)
Anion gap: 6 (ref 5–15)
BUN: 17 mg/dL (ref 8–23)
CO2: 29 mmol/L (ref 22–32)
Calcium: 8.5 mg/dL — ABNORMAL LOW (ref 8.9–10.3)
Chloride: 98 mmol/L (ref 98–111)
Creatinine, Ser: 1.13 mg/dL (ref 0.61–1.24)
GFR, Estimated: >60 mL/min (ref >60)
Glucose, Bld: 112 mg/dL — ABNORMAL HIGH (ref 70–99)
Potassium: 3.3 mmol/L — ABNORMAL LOW (ref 3.5–5.1)
Sodium: 133 mmol/L — ABNORMAL LOW (ref 135–145)
Total Bilirubin: 0.6 mg/dL (ref 0.3–1.2)
Total Protein: 6.9 g/dL (ref 6.5–8.1)

## 2020-11-18 LAB — TECHNOLOGIST SMEAR REVIEW: Plt Morphology: ADEQUATE

## 2020-11-18 LAB — FOLATE: Folate: 18.7 ng/mL (ref >5.9)

## 2020-11-18 LAB — IRON AND TIBC
Iron: 67 ug/dL (ref 45–182)
Saturation Ratios: 24 % (ref 17.9–39.5)
TIBC: 281 ug/dL (ref 250–450)
UIBC: 214 ug/dL

## 2020-11-18 LAB — RETIC PANEL
Immature Retic Fract: 14.9 % (ref 2.3–15.9)
RBC.: 3.24 MIL/uL — ABNORMAL LOW (ref 4.22–5.81)
Retic Count, Absolute: 57.3 10*3/uL (ref 19.0–186.0)
Retic Ct Pct: 1.8 % (ref 0.4–3.1)
Reticulocyte Hemoglobin: 34.4 pg (ref >27.9)

## 2020-11-18 LAB — LACTATE DEHYDROGENASE: LDH: 174 U/L (ref 98–192)

## 2020-11-18 LAB — VITAMIN B12: Vitamin B-12: 284 pg/mL (ref 180–914)

## 2020-11-18 LAB — FERRITIN: Ferritin: 234 ng/mL (ref 24–336)

## 2020-11-18 NOTE — Progress Notes (Signed)
New patient evaluation.   

## 2020-11-18 NOTE — Progress Notes (Signed)
Hematology/Oncology Consult note [facility] [phone] [phone]   Patient Care Team: [name], MD as PCP - General (Internal Medicine)  REFERRING PROVIDER: Virgel Manifold, MD  CHIEF COMPLAINTS/REASON FOR VISIT:  Evaluation of anemia  HISTORY OF PRESENTING ILLNESS:   Todd Whitehead is a  75 y.o.  male with PMH listed below was seen in consultation at the request of  Virgel Manifold, MD  for evaluation of anemia. Patient was accompanied by her friend Ms. Williams. 8/31 2022, CBC showed a hemoglobin of 10.3, hematocrit 29.5, WBC 5.7  10/20/2020, iron panel showed decreased iron saturation of 12, ferritin 140, TIBC 265.  Patient reports feeling fatigued and tired.  Denies any hematemesis or hematochezia. Patient has chronic abdominal pain, and only is able to eat 1 meal per day.  Patient has significant weight loss as well as some nausea.  07/06/2020 CT abdomen pelvis showed no acute findings.  No explanation of patient's unintended weight loss.  Mucous plugging seen within the right lower lobe bronchi with minimal right base atelectasis or early infiltrate. Patient follows up with gastroenterology Dr.Tahiliani, has also been seen by surgery to [facility].  Surgical intervention was not recommended.   09/13/2020, CT abdomen pelvis with contrast at [facility] showed extensive atherosclerotic disease.  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  Previous cholecystectomy.  Prominence, bile duct.  Cyst of both kidneys.  Negative for hydronephrosis.  Moderate distention urinary bladder.  Questionable bladder outlet obstruction.  Prostate gland is mildly enlarged and heterogeneous. Patient was last seen by Dr. Bonna Gains on 10/20/2020.  GI recommends EGD and colonoscopy for further evaluation of his symptoms. Patient was seen by vascular surgery Dr.Schnier on 10/24/2020.  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 11/08/2020, aortogram  showed a diffusely diseased no hemodynamically significant lesions identified.  SMA was widely patent.  Celiac demonstrates greater than 90% stenosis at its origin but distal to the stenosis appears to be widely patent.  Patient will be rescheduled with plan of upper extremity access which will allow leverage enabling treatment of the lesion and revascularization and stent placement.  Patient was referred to establish care with hematology for evaluation of anemia.  Review of Systems  Constitutional:  Positive for appetite change, fatigue and unexpected weight change. Negative for chills, diaphoresis and fever.  HENT:   Negative for hearing loss, lump/mass, nosebleeds and sore throat.   Eyes:  Negative for eye problems and icterus.  Respiratory:  Negative for chest tightness, cough, hemoptysis, shortness of breath and wheezing.   Cardiovascular:  Negative for chest pain and leg swelling.  Gastrointestinal:  Positive for abdominal pain. Negative for abdominal distention, blood in stool, diarrhea, nausea and rectal pain.  Endocrine: Negative for hot flashes.  Genitourinary:  Negative for bladder incontinence, difficulty urinating, dysuria, frequency, hematuria and nocturia.   Musculoskeletal:  Negative for back pain, flank pain, gait problem and myalgias.  Skin:  Negative for rash.  Neurological:  Negative for dizziness, gait problem, headaches, numbness and seizures.  Hematological:  Negative for adenopathy. Does not bruise/bleed easily.  Psychiatric/Behavioral:  Negative for confusion and decreased concentration. The patient is not nervous/anxious.    MEDICAL HISTORY:  Past Medical History:  Diagnosis Date   Anxiety    Arthritis    Colon polyp    COPD (chronic obstructive pulmonary disease) ([place])    smoker   Dysrhythmia    Glaucoma    Heart murmur    Hypertension  on  no meds   Melanoma ([place])    Resected from Left upper arm.    Myocardial infarction [facility]) 2017   Peripheral  vascular disease [facility])    Prostate enlargement     SURGICAL HISTORY: Past Surgical History:  Procedure Laterality Date   BACK SURGERY  01/19/98   CARDIAC CATHETERIZATION N/A 08/19/2015   Procedure: Left Heart Cath and Coronary Angiography;  Surgeon: [name], MD;  Location: [place] [facility];  Service: Cardiovascular;  Laterality: N/A;   CHOLECYSTECTOMY N/A 08/19/2016   Procedure: LAPAROSCOPIC CHOLECYSTECTOMY WITH INTRAOPERATIVE CHOLANGIOGRAM;  Surgeon: [name], MD;  Location: [place];  Service: General;  Laterality: N/A;   COLON SURGERY  age 32 days old   " bowel was twisted up"   COLONOSCOPY WITH PROPOFOL N/A 03/19/2016   Procedure: COLONOSCOPY WITH PROPOFOL;  Surgeon: [name], MD;  Location: [facility] ENDOSCOPY;  Service: Endoscopy;  Laterality: N/A;   CORONARY ARTERY BYPASS GRAFT  2017   ERCP N/A 08/18/2016   Procedure: ENDOSCOPIC RETROGRADE CHOLANGIOPANCREATOGRAPHY (ERCP);  Surgeon: [name], MD;  Location: [place];  Service: Gastroenterology;  Laterality: N/A;   ESOPHAGOGASTRODUODENOSCOPY (EGD) WITH PROPOFOL N/A 09/18/2018   Procedure: ESOPHAGOGASTRODUODENOSCOPY (EGD) WITH PROPOFOL;  Surgeon: [name], DO;  Location: [place];  Service: General;  Laterality: N/A;   EYE SURGERY Left 03/26/11   LOWER EXTREMITY ANGIOGRAPHY Left 10/03/2017   Procedure: LOWER EXTREMITY ANGIOGRAPHY;  Surgeon: [name], MD;  Location: [place] [facility];  Service: Cardiovascular;  Laterality: Left;   LOWER EXTREMITY ANGIOGRAPHY Right 10/31/2017   Procedure: LOWER EXTREMITY ANGIOGRAPHY;  Surgeon: [name], MD;  Location: [place] [facility];  Service: Cardiovascular;  Laterality: Right;   LUMBAR LAMINECTOMY/DECOMPRESSION MICRODISCECTOMY Right 10/02/2012   Procedure: Right Lumbar Five-Sacral One Lumbar laminotomy/Microdiskectomy;  Surgeon: [name], MD;  Location: [place] [facility];  Service: Neurosurgery;  Laterality: Right;  Right Lumbar Five-Sacral One Lumbar  laminotomy/Microdiskectomy   VISCERAL ANGIOGRAPHY N/A 11/08/2020   Procedure: VISCERAL ANGIOGRAPHY;  Surgeon: Katha Cabal, MD;  Location: [place] [facility];  Service: Cardiovascular;  Laterality: N/A;    SOCIAL HISTORY: Social History   Socioeconomic History   Marital status: Divorced    Spouse name: Not on file   Number of children: Not on file   Years of education: Not on file   Highest education level: Not on file  Occupational History   Not on file  Tobacco Use   Smoking status: Every Day    Packs/day: 1.50    Years: 58.00    Pack years: 87.00    Types: Cigarettes   Smokeless tobacco: Never  Vaping Use   Vaping Use: Former  Substance and Sexual Activity   Alcohol use: No   Drug use: No   Sexual activity: Not Currently  Other Topics Concern   Not on file  Social History Narrative   Not on file   Social Determinants of Health   Financial Resource Strain: Not on file  Food Insecurity: Not on file  Transportation Needs: Not on file  Physical Activity: Not on file  Stress: Not on file  Social Connections: Not on file  Intimate Partner Violence: Not on file    FAMILY HISTORY: Family History  Problem Relation Age of Onset   Heart attack Father    Aneurysm Brother    Lung cancer Brother     ALLERGIES:  is allergic to other, poison ivy extract, and poison oak extract.  MEDICATIONS:  Current Outpatient Medications  Medication Sig Dispense Refill   acetaminophen (TYLENOL) 500 MG tablet Take 1 tablet (500 mg total) by mouth every 6 (six) hours as needed. (Patient taking differently: Take 500 mg by mouth every 6 (six) hours as needed for moderate pain or headache.) 30 tablet 0   ALPRAZolam (XANAX) 0.5 MG tablet Take 0.5 mg by mouth at bedtime as needed for sleep.     aspirin EC 81 MG tablet Take 81 mg by mouth daily. Swallow whole.     atorvastatin (LIPITOR) 40 MG tablet Take 40 mg by mouth daily.      brimonidine (ALPHAGAN) 0.2 % ophthalmic solution  Place 1 drop into both eyes 2 (two) times daily.     busPIRone (BUSPAR) 30 MG tablet Take 30 mg by mouth 2 (two) times daily.     citalopram (CELEXA) 20 MG tablet Take 20 mg by mouth daily.     latanoprost (XALATAN) 0.005 % ophthalmic solution Place 1 drop into both eyes at bedtime.   2   metoprolol succinate (TOPROL-XL) 25 MG 24 hr tablet Take 12.5 mg by mouth daily.     QUEtiapine (SEROQUEL) 25 MG tablet Take 25 mg by mouth at bedtime.     traMADol (ULTRAM) 50 MG tablet Take 50 mg by mouth 2 (two) times daily.  2   traZODone (DESYREL) 50 MG tablet Take 50 mg by mouth at bedtime.     clopidogrel (PLAVIX) 75 MG tablet Take 75 mg by mouth daily. (Patient not taking: No sig reported)     sildenafil (REVATIO) 20 MG tablet TAKE 2 TO 5 TABLETS AS NEEDED FOR ERECTION (Patient not taking: No sig reported) 30 tablet 3   tamsulosin (FLOMAX) 0.4 MG CAPS capsule Take 1 capsule (0.4 mg total) by mouth daily after breakfast. (Patient not taking: No sig reported) 30 capsule 2   No current facility-administered medications for this visit.     PHYSICAL EXAMINATION: ECOG PERFORMANCE STATUS: 1 - Symptomatic but completely ambulatory Vitals:   11/18/20 1254  BP: (!) 148/88  Pulse: 67  Resp: 16   Filed Weights   11/18/20 1254  Weight: 120 lb (54.4 kg)    Physical Exam Constitutional:      General: He is not in acute distress.    Comments: Thin, ambulates independantly  HENT:     Head: Normocephalic and atraumatic.  Eyes:     General: No scleral icterus. Cardiovascular:     Rate and Rhythm: Normal rate and regular rhythm.     Heart sounds: Normal heart sounds.  Pulmonary:     Effort: Pulmonary effort is normal. No respiratory distress.     Breath sounds: No wheezing.  Abdominal:     General: Bowel sounds are normal. There is no distension.     Palpations: Abdomen is soft.  Musculoskeletal:        General: No deformity. Normal range of motion.     Cervical back: Normal range of motion and  neck supple.  Skin:    General: Skin is warm and dry.     Findings: No erythema or rash.  Neurological:     Mental Status: He is alert and oriented to person, place, and time. Mental status is at baseline.     Cranial Nerves: No cranial nerve deficit.     Coordination: Coordination normal.  Psychiatric:        Mood and Affect: Mood normal.    LABORATORY DATA:  I have reviewed the data as  listed Lab Results  Component Value Date   WBC 5.7 11/18/2020   HGB 9.8 (L) 11/18/2020   HCT 28.7 (L) 11/18/2020   MCV 88.3 11/18/2020   PLT 343 11/18/2020   Recent Labs    07/06/20 1505 07/06/20 1714 11/08/20 0749 11/18/20 1318  NA 138  --   --  133*  K 3.6  --   --  3.3*  CL 98  --   --  98  CO2 25  --   --  29  GLUCOSE 107*  --   --  112*  BUN 18  --  17 17  CREATININE 1.05 1.10 1.35* 1.13  CALCIUM 9.0  --   --  8.5*  GFRNONAA  --   --  55* >60  PROT 6.3  --   --  6.9  ALBUMIN 4.1  --   --  3.1*  AST 25  --   --  29  ALT 24  --   --  17  ALKPHOS 109  --   --  88  BILITOT 0.3  --   --  0.6   Iron/TIBC/Ferritin/ %Sat    Component Value Date/Time   IRON 67 11/18/2020 1318   IRON 33 (L) 10/20/2020 1335   TIBC 281 11/18/2020 1318   TIBC 265 10/20/2020 1335   FERRITIN 234 11/18/2020 1318   FERRITIN 140 10/20/2020 1335   IRONPCTSAT 24 11/18/2020 1318   IRONPCTSAT 12 (L) 10/20/2020 1335      RADIOGRAPHIC STUDIES: I have personally reviewed the radiological images as listed and agreed with the findings in the report. PERIPHERAL VASCULAR CATHETERIZATION  Result Date: 11/08/2020 See surgical note for result.  VAS [place] ABI WITH/WO TBI  Result Date: 10/24/2020  LOWER EXTREMITY DOPPLER STUDY Patient Name:  Nghia Mcentee  Date of Exam:   10/24/2020 Medical Rec #: [id number]        Accession #:    [id number] Date of Birth: December 22, 1945        Patient Gender: M Patient Age:   61 years Exam [facility] Procedure:      VAS [place] ABI WITH/WO TBI Referring Phys: Eulogio Ditch --------------------------------------------------------------------------------  Indications: Peripheral artery disease. Other Factors: Mesenteric ultrasound was not performed due to patient not NPO.                Previous CTA 3 months ago.  Performing Technologist: Charlane Ferretti RT (R)(VS)  Examination Guidelines: A complete evaluation includes at minimum, Doppler waveform signals and systolic blood pressure reading at the level of bilateral brachial, anterior tibial, and posterior tibial arteries, when vessel segments are accessible. Bilateral testing is considered an integral part of a complete examination. Photoelectric Plethysmograph (PPG) waveforms and toe systolic pressure readings are included as required and additional duplex testing as needed. Limited examinations for reoccurring indications may be performed as noted.  ABI Findings: +---------+------------------+-----+---------+--------+ Right    Rt Pressure (mmHg)IndexWaveform Comment  +---------+------------------+-----+---------+--------+ Brachial 171                                      +---------+------------------+-----+---------+--------+ ATA      182                    triphasic         +---------+------------------+-----+---------+--------+ PTA      189  1.07 triphasic         +---------+------------------+-----+---------+--------+ Great Toe110               0.62 Normal            +---------+------------------+-----+---------+--------+ +---------+------------------+-----+---------+-------+ Left     Lt Pressure (mmHg)IndexWaveform Comment +---------+------------------+-----+---------+-------+ Brachial 177                                     +---------+------------------+-----+---------+-------+ ATA      182                    triphasic        +---------+------------------+-----+---------+-------+ PTA      189               1.07 triphasic         +---------+------------------+-----+---------+-------+ Ellen Henri               0.63 Normal           +---------+------------------+-----+---------+-------+ +-------+-----------+-----------+------------+------------+ ABI/TBIToday's ABIToday's TBIPrevious ABIPrevious TBI +-------+-----------+-----------+------------+------------+ Right  1.07       .62        1.16        1.03         +-------+-----------+-----------+------------+------------+ Left   1.07       .63        1.15        .95          +-------+-----------+-----------+------------+------------+ Bilateral ABIs appear essentially unchanged compared to prior study on 08/25/2019. Bilateral TBIs appear decreased compared to prior study on 08/25/2019.  Summary: Right: Resting right ankle-brachial index is within normal range. No evidence of significant right lower extremity arterial disease. The right toe-brachial index is abnormal. Left: Resting left ankle-brachial index is within normal range. No evidence of significant left lower extremity arterial disease. The left toe-brachial index is abnormal.  *See table(s) above for measurements and observations.  Electronically signed by [name] MD on 10/24/2020 at 6:03:17 PM.    Final       ASSESSMENT & PLAN:  1. Anemia, unspecified type   2. Low serum vitamin B12    For anemia work-up I recommend to check CBC, CMP, multiple myeloma panel, kappa light chain ratio, iron, TIBC ferritin, B12, folate, smear, reticulocyte panel and LDH. Patient will follow-up in 2 to 3 weeks to discuss results and management plan.   Some of the blood work results were available at the time of dictation. B12 was borderline low at 284.  I recommend patient to start parenteral vitamin B12 injection weekly x4   Orders Placed This Encounter  Procedures   Iron and TIBC    Standing Status:   Future    Number of Occurrences:   1    Standing Expiration Date:   11/18/2021   Ferritin    Standing  Status:   Future    Number of Occurrences:   1    Standing Expiration Date:   05/19/2021   Folate    Standing Status:   Future    Number of Occurrences:   1    Standing Expiration Date:   11/18/2021   Vitamin B12    Standing Status:   Future    Number of Occurrences:   1    Standing Expiration Date:   11/18/2021   Technologist smear review    Standing Status:   Future  Number of Occurrences:   1    Standing Expiration Date:   11/18/2021   Comprehensive metabolic panel    Standing Status:   Future    Number of Occurrences:   1    Standing Expiration Date:   11/18/2021   CBC with Differential/Platelet    Standing Status:   Future    Number of Occurrences:   1    Standing Expiration Date:   11/18/2021   Retic Panel    Standing Status:   Future    Number of Occurrences:   1    Standing Expiration Date:   11/18/2021   Kappa/lambda light chains    Standing Status:   Future    Number of Occurrences:   1    Standing Expiration Date:   11/18/2021   Multiple Myeloma Panel (SPEP&IFE w/QIG)    Standing Status:   Future    Number of Occurrences:   1    Standing Expiration Date:   11/18/2021   Lactate dehydrogenase    Standing Status:   Future    Number of Occurrences:   1    Standing Expiration Date:   11/18/2021    All questions were answered. The patient knows to call the clinic with any problems questions or concerns.   Virgel Manifold, MD    Return of visit:  Thank you for this kind referral and the opportunity to participate in the care of this patient. A copy of today's note is routed to referring provider    Earlie Server, MD, PhD Hematology Oncology [place] [facility]  11/18/2020

## 2020-11-21 ENCOUNTER — Other Ambulatory Visit: Payer: Self-pay

## 2020-11-21 ENCOUNTER — Ambulatory Visit (INDEPENDENT_AMBULATORY_CARE_PROVIDER_SITE_OTHER): Payer: Medicare Other | Admitting: Vascular Surgery

## 2020-11-21 VITALS — BP 154/71 | HR 76 | Ht 67.0 in | Wt 121.0 lb

## 2020-11-21 DIAGNOSIS — I739 Peripheral vascular disease, unspecified: Secondary | ICD-10-CM | POA: Diagnosis not present

## 2020-11-21 DIAGNOSIS — I10 Essential (primary) hypertension: Secondary | ICD-10-CM | POA: Diagnosis not present

## 2020-11-21 DIAGNOSIS — I25118 Atherosclerotic heart disease of native coronary artery with other forms of angina pectoris: Secondary | ICD-10-CM

## 2020-11-21 DIAGNOSIS — I771 Stricture of artery: Secondary | ICD-10-CM

## 2020-11-21 DIAGNOSIS — E785 Hyperlipidemia, unspecified: Secondary | ICD-10-CM

## 2020-11-21 LAB — KAPPA/LAMBDA LIGHT CHAINS
Kappa free light chain: 67.6 mg/L — ABNORMAL HIGH (ref 3.3–19.4)
Kappa, lambda light chain ratio: 2.45 — ABNORMAL HIGH (ref 0.26–1.65)
Lambda free light chains: 27.6 mg/L — ABNORMAL HIGH (ref 5.7–26.3)

## 2020-11-21 NOTE — Telephone Encounter (Signed)
Dr. Tasia Catchings, when would you want him to follow up?

## 2020-11-21 NOTE — Telephone Encounter (Signed)
Jennifer please schedule patient for weekly b12 x 4...ahs

## 2020-11-21 NOTE — Telephone Encounter (Signed)
-----   Message from Earlie Server, MD sent at 11/18/2020  8:33 PM EDT ----- Please let him know that some of his results are back, he has low B12. Recommend him to start B12 injections. Please arrange B12 weekly x 4, thanks.

## 2020-11-22 ENCOUNTER — Telehealth (INDEPENDENT_AMBULATORY_CARE_PROVIDER_SITE_OTHER): Payer: Self-pay

## 2020-11-22 LAB — MULTIPLE MYELOMA PANEL, SERUM
Albumin SerPl Elph-Mcnc: 3 g/dL (ref 2.9–4.4)
Albumin/Glob SerPl: 1 (ref 0.7–1.7)
Alpha 1: 0.4 g/dL (ref 0.0–0.4)
Alpha2 Glob SerPl Elph-Mcnc: 0.9 g/dL (ref 0.4–1.0)
B-Globulin SerPl Elph-Mcnc: 0.8 g/dL (ref 0.7–1.3)
Gamma Glob SerPl Elph-Mcnc: 1.1 g/dL (ref 0.4–1.8)
Globulin, Total: 3.2 g/dL (ref 2.2–3.9)
IgA: 255 mg/dL (ref 61–437)
IgG (Immunoglobin G), Serum: 1161 mg/dL (ref 603–1613)
IgM (Immunoglobulin M), Srm: 94 mg/dL (ref 15–143)
M Protein SerPl Elph-Mcnc: 0.4 g/dL — ABNORMAL HIGH
Total Protein ELP: 6.2 g/dL (ref 6.0–8.5)

## 2020-11-22 NOTE — Telephone Encounter (Signed)
Spoke with the patient and he is scheduled with Dr. Delana Meyer for a  celiac stent  left arm approach on 11/25/20 with a 9:00 am arrival time to the MM. Pre-procedure instructions were discussed and per the patient he wrote it done. This will also be mailed.

## 2020-11-23 ENCOUNTER — Encounter: Payer: Self-pay | Admitting: Oncology

## 2020-11-23 ENCOUNTER — Inpatient Hospital Stay: Payer: Medicare Other

## 2020-11-23 ENCOUNTER — Other Ambulatory Visit: Payer: Self-pay

## 2020-11-23 DIAGNOSIS — E538 Deficiency of other specified B group vitamins: Secondary | ICD-10-CM

## 2020-11-23 DIAGNOSIS — D649 Anemia, unspecified: Secondary | ICD-10-CM | POA: Diagnosis not present

## 2020-11-23 MED ORDER — CYANOCOBALAMIN 1000 MCG/ML IJ SOLN
1000.0000 ug | Freq: Once | INTRAMUSCULAR | Status: AC
Start: 2020-11-23 — End: 2020-11-23
  Administered 2020-11-23: 1000 ug via INTRAMUSCULAR
  Filled 2020-11-23: qty 1

## 2020-11-24 ENCOUNTER — Other Ambulatory Visit (INDEPENDENT_AMBULATORY_CARE_PROVIDER_SITE_OTHER): Payer: Self-pay | Admitting: Nurse Practitioner

## 2020-11-24 DIAGNOSIS — I771 Stricture of artery: Secondary | ICD-10-CM

## 2020-11-25 ENCOUNTER — Encounter
Admission: RE | Disposition: A | Discharge status: Home / Self Care | Payer: Self-pay | Source: Home / Self Care | Attending: Vascular Surgery

## 2020-11-25 ENCOUNTER — Encounter (INDEPENDENT_AMBULATORY_CARE_PROVIDER_SITE_OTHER): Payer: Self-pay | Admitting: Vascular Surgery

## 2020-11-25 ENCOUNTER — Ambulatory Visit
Admission: RE | Admit: 2020-11-25 | Discharge: 2020-11-25 | Disposition: A | Discharge status: Home / Self Care | Payer: Medicare Other | Attending: Vascular Surgery | Admitting: Vascular Surgery

## 2020-11-25 ENCOUNTER — Encounter: Payer: Self-pay | Admitting: Vascular Surgery

## 2020-11-25 ENCOUNTER — Other Ambulatory Visit: Payer: Self-pay

## 2020-11-25 DIAGNOSIS — Z7982 Long term (current) use of aspirin: Secondary | ICD-10-CM | POA: Diagnosis not present

## 2020-11-25 DIAGNOSIS — F17210 Nicotine dependence, cigarettes, uncomplicated: Secondary | ICD-10-CM | POA: Insufficient documentation

## 2020-11-25 DIAGNOSIS — E785 Hyperlipidemia, unspecified: Secondary | ICD-10-CM | POA: Insufficient documentation

## 2020-11-25 DIAGNOSIS — E1151 Type 2 diabetes mellitus with diabetic peripheral angiopathy without gangrene: Secondary | ICD-10-CM | POA: Diagnosis not present

## 2020-11-25 DIAGNOSIS — N183 Chronic kidney disease, stage 3 unspecified: Secondary | ICD-10-CM | POA: Insufficient documentation

## 2020-11-25 DIAGNOSIS — I771 Stricture of artery: Secondary | ICD-10-CM | POA: Insufficient documentation

## 2020-11-25 DIAGNOSIS — E1122 Type 2 diabetes mellitus with diabetic chronic kidney disease: Secondary | ICD-10-CM | POA: Diagnosis not present

## 2020-11-25 DIAGNOSIS — K551 Chronic vascular disorders of intestine: Secondary | ICD-10-CM

## 2020-11-25 DIAGNOSIS — Z79899 Other long term (current) drug therapy: Secondary | ICD-10-CM | POA: Diagnosis not present

## 2020-11-25 DIAGNOSIS — J449 Chronic obstructive pulmonary disease, unspecified: Secondary | ICD-10-CM | POA: Diagnosis not present

## 2020-11-25 DIAGNOSIS — K559 Vascular disorder of intestine, unspecified: Secondary | ICD-10-CM

## 2020-11-25 HISTORY — PX: VISCERAL ANGIOGRAPHY: CATH118276

## 2020-11-25 LAB — CREATININE, SERUM
Creatinine, Ser: 1.04 mg/dL (ref 0.61–1.24)
GFR, Estimated: >60 mL/min (ref >60)

## 2020-11-25 LAB — BUN: BUN: 15 mg/dL (ref 8–23)

## 2020-11-25 SURGERY — VISCERAL ANGIOGRAPHY
Anesthesia: Moderate Sedation

## 2020-11-25 MED ORDER — CEFAZOLIN SODIUM-DEXTROSE 2-4 GM/100ML-% IV SOLN
INTRAVENOUS | Status: AC
  Filled 2020-11-25: qty 100

## 2020-11-25 MED ORDER — ACETAMINOPHEN 325 MG PO TABS: 650.0000 mg | ORAL_TABLET | ORAL | Status: DC | PRN

## 2020-11-25 MED ORDER — ONDANSETRON HCL 4 MG/2ML IJ SOLN: 4.0000 mg | Freq: Four times a day (QID) | INTRAMUSCULAR | Status: DC | PRN

## 2020-11-25 MED ORDER — FENTANYL CITRATE PF 50 MCG/ML IJ SOSY
PREFILLED_SYRINGE | INTRAMUSCULAR | Status: AC
  Filled 2020-11-25: qty 1

## 2020-11-25 MED ORDER — CLOPIDOGREL BISULFATE 75 MG PO TABS: 75.0000 mg | ORAL_TABLET | Freq: Every day | ORAL | 5 refills | Status: DC

## 2020-11-25 MED ORDER — MIDAZOLAM HCL 2 MG/2ML IJ SOLN
INTRAMUSCULAR | Status: AC
  Filled 2020-11-25: qty 4

## 2020-11-25 MED ORDER — LABETALOL HCL 5 MG/ML IV SOLN: 10.0000 mg | INTRAVENOUS | Status: DC | PRN

## 2020-11-25 MED ORDER — SODIUM CHLORIDE 0.9 % IV SOLN: 250.0000 mL | INTRAVENOUS | Status: DC | PRN

## 2020-11-25 MED ORDER — OXYCODONE HCL 5 MG PO TABS: 5.0000 mg | ORAL_TABLET | ORAL | Status: DC | PRN

## 2020-11-25 MED ORDER — HEPARIN SODIUM (PORCINE) 1000 UNIT/ML IJ SOLN
INTRAMUSCULAR | Status: AC
  Filled 2020-11-25: qty 1

## 2020-11-25 MED ORDER — MIDAZOLAM HCL 2 MG/2ML IJ SOLN
INTRAMUSCULAR | Status: DC | PRN
  Administered 2020-11-25 (×3): .5 mg via INTRAVENOUS
  Administered 2020-11-25: 1 mg via INTRAVENOUS

## 2020-11-25 MED ORDER — MORPHINE SULFATE (PF) 4 MG/ML IV SOLN
2.0000 mg | INTRAVENOUS | Status: DC | PRN
Start: 2020-11-25 — End: 2020-11-25

## 2020-11-25 MED ORDER — MIDAZOLAM HCL 2 MG/ML PO SYRP: 8.0000 mg | ORAL_SOLUTION | Freq: Once | ORAL | Status: DC | PRN

## 2020-11-25 MED ORDER — FENTANYL CITRATE (PF) 100 MCG/2ML IJ SOLN
INTRAMUSCULAR | Status: AC
  Filled 2020-11-25: qty 2

## 2020-11-25 MED ORDER — IODIXANOL 320 MG/ML IV SOLN
INTRAVENOUS | Status: DC | PRN
  Administered 2020-11-25: 70 mL via INTRA_ARTERIAL

## 2020-11-25 MED ORDER — CEFAZOLIN SODIUM-DEXTROSE 1-4 GM/50ML-% IV SOLN
INTRAVENOUS | Status: DC | PRN
  Administered 2020-11-25: 2 g via INTRAVENOUS

## 2020-11-25 MED ORDER — SODIUM CHLORIDE 0.9% FLUSH: 3.0000 mL | Freq: Two times a day (BID) | INTRAVENOUS | Status: DC

## 2020-11-25 MED ORDER — SODIUM CHLORIDE 0.9 % IV SOLN: INTRAVENOUS | Status: DC

## 2020-11-25 MED ORDER — SODIUM CHLORIDE 0.9 % IV SOLN
INTRAVENOUS | Status: DC
Start: 2020-11-25 — End: 2020-11-25

## 2020-11-25 MED ORDER — FAMOTIDINE 20 MG PO TABS: 40.0000 mg | ORAL_TABLET | Freq: Once | ORAL | Status: DC | PRN

## 2020-11-25 MED ORDER — FENTANYL CITRATE (PF) 100 MCG/2ML IJ SOLN
INTRAMUSCULAR | Status: DC | PRN
  Administered 2020-11-25 (×3): 25 ug via INTRAVENOUS
  Administered 2020-11-25: 50 ug via INTRAVENOUS
  Administered 2020-11-25: 25 ug via INTRAVENOUS

## 2020-11-25 MED ORDER — HYDRALAZINE HCL 20 MG/ML IJ SOLN: 5.0000 mg | INTRAMUSCULAR | Status: DC | PRN

## 2020-11-25 MED ORDER — HYDROMORPHONE HCL 1 MG/ML IJ SOLN: 1.0000 mg | Freq: Once | INTRAMUSCULAR | Status: DC | PRN

## 2020-11-25 MED ORDER — METHYLPREDNISOLONE SODIUM SUCC 125 MG IJ SOLR: 125.0000 mg | Freq: Once | INTRAMUSCULAR | Status: DC | PRN

## 2020-11-25 MED ORDER — SODIUM CHLORIDE 0.9% FLUSH: 3.0000 mL | INTRAVENOUS | Status: DC | PRN

## 2020-11-25 MED ORDER — CEFAZOLIN SODIUM-DEXTROSE 2-4 GM/100ML-% IV SOLN: 2.0000 g | Freq: Once | INTRAVENOUS | Status: DC

## 2020-11-25 MED ORDER — DIPHENHYDRAMINE HCL 50 MG/ML IJ SOLN: 50.0000 mg | Freq: Once | INTRAMUSCULAR | Status: DC | PRN

## 2020-11-25 MED ORDER — HEPARIN SODIUM (PORCINE) 1000 UNIT/ML IJ SOLN
INTRAMUSCULAR | Status: DC | PRN
  Administered 2020-11-25: 4000 [IU] via INTRAVENOUS

## 2020-11-25 MED ORDER — CLOPIDOGREL BISULFATE 300 MG PO TABS: 300.0000 mg | ORAL_TABLET | ORAL | Status: DC

## 2020-11-25 SURGICAL SUPPLY — 25 items
BALLN LUTONIX DCB 4X40X130 (BALLOONS) ×2
BALLN ULTRVRSE 7X40X130C (BALLOONS) ×2
BALLOON LUTONIX DCB 4X40X130 (BALLOONS) IMPLANT
BALLOON ULTRVRSE 7X40X130C (BALLOONS) IMPLANT
CATH ANGIO 5F PIGTAIL 100CM (CATHETERS) ×1 IMPLANT
CATH BERNSTEIN 5FR 130CM (CATHETERS) ×1 IMPLANT
CATH INFINITI 5F MPA2 125 (CATHETERS) ×1 IMPLANT
COVER PROBE U/S 5X48 (MISCELLANEOUS) ×1 IMPLANT
DEVICE RAD TR BAND REGULAR (VASCULAR PRODUCTS) ×1 IMPLANT
DEVICE TORQUE .025-.038 (MISCELLANEOUS) ×1 IMPLANT
GLIDEWIRE ANGLED SS 035X260CM (WIRE) ×1 IMPLANT
KIT ENCORE 26 ADVANTAGE (KITS) ×1 IMPLANT
NDL ENTRY 21GA 7CM ECHOTIP (NEEDLE) IMPLANT
NEEDLE ENTRY 21GA 7CM ECHOTIP (NEEDLE) ×2 IMPLANT
PACK ANGIOGRAPHY (CUSTOM PROCEDURE TRAY) ×2 IMPLANT
SET INTRO CAPELLA COAXIAL (SET/KITS/TRAYS/PACK) ×1 IMPLANT
SHEATH BRITE TIP 5FRX11 (SHEATH) ×1 IMPLANT
SHEATH BRITE TIP 6FRX5.5 (SHEATH) ×1 IMPLANT
SHEATH GUIDING CAROTID 6FRX90 (SHEATH) ×1 IMPLANT
STENT LIFESTREAM 6X26X135 (Permanent Stent) IMPLANT
STENT LIFESTREAM 6X37X135 (Permanent Stent) ×1 IMPLANT
SYR MEDRAD MARK 7 150ML (SYRINGE) ×1 IMPLANT
TUBING CONTRAST HIGH PRESS 72 (TUBING) ×1 IMPLANT
WIRE GUIDERIGHT .035X150 (WIRE) ×1 IMPLANT
WIRE MAGIC TORQUE 315CM (WIRE) ×1 IMPLANT

## 2020-11-25 NOTE — Op Note (Signed)
[facility]  Percutaneous Study/Intervention Procedural Note   Date: 11/25/2020  Surgeon(s): [name], MD  Assistants: none  Pre-operative Diagnosis: 1.  Chronic mesenteric ischemia 2.  Greater than 90% stenosis of the celiac artery   Post-operative diagnosis:  Same  Procedure(s) Performed:             1.  Ultrasound guidance for vascular access left radial artery femoral artery             2.  Catheter placement into the common hepatic artery from left radial approach             3.  Aortogram and selective angiogram of the celiac access             4.  Stent to the celiac artery origin with 6 mm diameter x 37 mm length balloon expandable lifestream stent postdilated to 7 mm             5.  TR band placed to the left wrist  Contrast: 70 cc  Fluoro time: 7.3 minutes  EBL: 10 cc   Anesthesia: Approximately 79 minutes of Moderate conscious sedation using parenteral Versed and Fentanyl.  Continuous ECG pulse oximetry and cardiopulmonary monitoring was performed throughout the entire procedure by the interventional radiology nurse total sedation time was 79 minutes.              Indications:  Patient is a 75 y.o. male who has symptoms consistent with mesenteric ischemia. The patient has a CT of the abdomen pelvis as well as a previous angiogram from the femoral approach showing greater than 90% stenosis of the celiac artery at its origin. The patient is brought in for angiography for further evaluation and potential treatment. Risks and benefits are discussed and informed consent is obtained  Procedure:  The patient was identified and appropriate procedural time out was performed.  The patient was then placed supine on the table and prepped and draped in the usual sterile fashion. Moderate conscious sedation was administered during a face to face encounter with the patient throughout the procedure with my supervision of the RN administering medicines and  monitoring the patient's vital signs, pulse oximetry, telemetry and mental status throughout from the start of the procedure until the patient was taken to the recovery room. Ultrasound was used to evaluate the left radial artery.  It was patent .  A digital ultrasound image was acquired.  A micropuncture needle was used to access the left radial  artery under direct ultrasound guidance and a permanent image was performed.  Microwire followed by micro sheath was inserted.  A 0.035 J wire was advanced without resistance and a 5Fr sheath was placed.  Pigtail catheter and stiff angled Glidewire was placed into the descending thoracic aorta and a lateral aortogram was performed. This demonstrated the origin of the celiac artery.   The patient was given 4000 units of IV heparin. We upsized to a 90 cm 6 Fr destination sheath.  A multipurpose catheter was used to selectively cannulate the celiac.  With the catheter positioned in the common hepatic angiography was performed to better define the anatomy of the celiac common hepatic and splenic arteries.  After appropriate measurements we then transition back to the lateral view.  I crossed the lesion without difficulty with 4 mm x 40 mm Lutonix drug-eluting balloon.  This was inflated to 10 atm for approximately 1 minute.  I then used a 6 mm mm diameter x  37 mm length balloon expandable lifestream stent to perform treatment of the origin of the celiac artery. I inflated the balloon to 10 atm.  Next I advanced a 7 mm x 40 mm Ultraverse balloon across the stent and inflated the balloon to 10 atm for approximately 30 seconds.  On completion angiogram following this, less than 10%  residual stenosis was identified. At this point, I elected to terminate the procedure. The diagnostic catheter was removed. StarClose closure device was deployed in usual fashion with excellent hemostatic result. The patient was taken to the recovery room in stable condition having tolerated the  procedure well.     Findings: Initial imaging of the celiac demonstrate greater than 90% stenosis at its origin.  The common hepatic and splenic as well as the distal arteries are all widely patent.  Following angioplasty and stent placement there is now less than 10% residual stenosis with rapid filling of the celiac axis from the aorta.  Disposition: Patient was taken to the recovery room in stable condition having tolerated the procedure well.  Complications:  None  [name] 11/25/2020 12:08 PM   This note was created with Dragon Medical transcription system. Any errors in dictation are purely unintentional.

## 2020-11-25 NOTE — H&P (View-Only) (Signed)
MRN : [id number]  Todd Whitehead is a 75 y.o. (1945/03/14) male who presents with chief complaint of belly hurts.  History of Present Illness:  The patient returns to the office for followup and review status post angiogram without intervention 11/08/2020.  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  The patient continues to have unrelenting chronic abdominal pain.  There have been no significant changes to the patient's overall health care.  The patient denies amaurosis fugax or recent TIA symptoms. There are no recent neurological changes noted. The patient denies history of DVT, PE or superficial thrombophlebitis. The patient denies recent episodes of angina or shortness of breath.     Current Meds  Medication Sig   acetaminophen (TYLENOL) 500 MG tablet Take 1 tablet (500 mg total) by mouth every 6 (six) hours as needed. (Patient taking differently: Take 500 mg by mouth every 6 (six) hours as needed for moderate pain or headache.)   ALPRAZolam (XANAX) 0.5 MG tablet Take 0.25 mg by mouth at bedtime as needed for sleep.   aspirin EC 81 MG tablet Take 81 mg by mouth daily. Swallow whole.   atorvastatin (LIPITOR) 40 MG tablet Take 40 mg by mouth daily.    brimonidine (ALPHAGAN) 0.2 % ophthalmic solution Place 1 drop into both eyes 2 (two) times daily.   busPIRone (BUSPAR) 30 MG tablet Take 30 mg by mouth 2 (two) times daily.   citalopram (CELEXA) 20 MG tablet Take 20 mg by mouth daily.   latanoprost (XALATAN) 0.005 % ophthalmic solution Place 1 drop into both eyes at bedtime.    metoprolol succinate (TOPROL-XL) 25 MG 24 hr tablet Take 12.5 mg by mouth daily.   QUEtiapine (SEROQUEL) 25 MG tablet Take 25 mg by mouth at bedtime.   sildenafil (REVATIO) 20 MG tablet TAKE 2 TO 5 TABLETS AS NEEDED FOR ERECTION (Patient not taking: Reported on 11/22/2020)   tamsulosin (FLOMAX) 0.4 MG CAPS  capsule Take 1 capsule (0.4 mg total) by mouth daily after breakfast.   traMADol (ULTRAM) 50 MG tablet Take 50 mg by mouth 2 (two) times daily.   traZODone (DESYREL) 50 MG tablet Take 50 mg by mouth at bedtime.   [DISCONTINUED] ALPRAZolam (XANAX) 0.5 MG tablet Take by mouth.   [DISCONTINUED] clopidogrel (PLAVIX) 75 MG tablet Take 75 mg by mouth daily.    Past Medical History:  Diagnosis Date   Anxiety    Arthritis    Colon polyp    COPD (chronic obstructive pulmonary disease) ([place])    smoker   Dysrhythmia    Glaucoma    Heart murmur    Hypertension    on  no meds   Low serum vitamin B12 11/18/2020   Melanoma ([place])    Resected from Left upper arm.    Myocardial infarction [facility]) 2017   Peripheral vascular disease [facility])    Prostate enlargement     Past Surgical History:  Procedure Laterality Date   BACK SURGERY  01/19/98   CARDIAC CATHETERIZATION N/A 08/19/2015   Procedure: Left Heart Cath and Coronary Angiography;  Surgeon: [name], MD;  Location: [place] CV LAB;  Service: Cardiovascular;  Laterality: N/A;   CHOLECYSTECTOMY N/A 08/19/2016   Procedure: LAPAROSCOPIC CHOLECYSTECTOMY WITH INTRAOPERATIVE CHOLANGIOGRAM;  Surgeon: [name], MD;  Location: [place];  Service: General;  Laterality: N/A;   COLON SURGERY  age  72 days old   " bowel was twisted up"   COLONOSCOPY WITH PROPOFOL N/A 03/19/2016   Procedure: COLONOSCOPY WITH PROPOFOL;  Surgeon: [name], MD;  Location: [facility] END[facility]COPY;  Service: Endoscopy;  Laterality: N/A;   CORONARY ARTERY BYPASS GRAFT  2017   ERCP N/A 08/18/2016   Procedure: ENDOSCOPIC RETROGRADE CHOLANGIOPANCREATOGRAPHY (ERCP);  Surgeon: [name], MD;  Location: [place];  Service: Gastroenterology;  Laterality: N/A;   ESOPHAGOGASTRODUODENOSCOPY (EGD) WITH PROPOFOL N/A 09/18/2018   Procedure: ESOPHAGOGASTRODUODENOSCOPY (EGD) WITH PROPOFOL;  Surgeon: [name], DO;  Location: [place];  Service: General;  Laterality: N/A;    EYE SURGERY Left 03/26/11   LOWER EXTREMITY ANGIOGRAPHY Left 10/03/2017   Procedure: LOWER EXTREMITY ANGIOGRAPHY;  Surgeon: [name], MD;  Location: [place] CV LAB;  Service: Cardiovascular;  Laterality: Left;   LOWER EXTREMITY ANGIOGRAPHY Right 10/31/2017   Procedure: LOWER EXTREMITY ANGIOGRAPHY;  Surgeon: [name], MD;  Location: [place] CV LAB;  Service: Cardiovascular;  Laterality: Right;   LUMBAR LAMINECTOMY/DECOMPRESSION MICRODISCECTOMY Right 10/02/2012   Procedure: Right Lumbar Five-Sacral One Lumbar laminotomy/Microdiskectomy;  Surgeon: [name], MD;  Location: [place] NEURO ORS;  Service: Neurosurgery;  Laterality: Right;  Right Lumbar Five-Sacral One Lumbar laminotomy/Microdiskectomy   VISCERAL ANGIOGRAPHY N/A 11/08/2020   Procedure: VISCERAL ANGIOGRAPHY;  Surgeon: Katha Cabal, MD;  Location: [place] CV LAB;  Service: Cardiovascular;  Laterality: N/A;    Social History Social History   Tobacco Use   Smoking status: Every Day    Packs/day: 1.50    Years: 58.00    Pack years: 87.00    Types: Cigarettes   Smokeless tobacco: Never  Vaping Use   Vaping Use: Former  Substance Use Topics   Alcohol use: No   Drug use: No    Family History Family History  Problem Relation Age of Onset   Heart attack Father    Aneurysm Brother    Lung cancer Brother     Allergies  Allergen Reactions   Other Nausea And Vomiting    Cheese,butter,sour cream,cottage cheese   Poison Ivy Extract Rash   Poison Oak Extract Rash     REVIEW OF SYSTEMS (Negative unless checked)  Constitutional: [] Weight loss  [] Fever  [] Chills Cardiac: [] Chest pain   [] Chest pressure   [] Palpitations   [] Shortness of breath when laying flat   [] Shortness of breath with exertion. Vascular:  [] Pain in legs with walking   [] Pain in legs at rest  [] History of DVT   [] Phlebitis   [] Swelling in legs   [] Varicose veins   [] Non-healing ulcers Pulmonary:   [] Uses home oxygen   [] Productive  cough   [] Hemoptysis   [] Wheeze  [] COPD   [] Asthma Neurologic:  [] Dizziness   [] Seizures   [] History of stroke   [] History of TIA  [] Aphasia   [] Vissual changes   [] Weakness or numbness in arm   [] Weakness or numbness in leg Musculoskeletal:   [] Joint swelling   [] Joint pain   [] Low back pain Hematologic:  [] Easy bruising  [] Easy bleeding   [] Hypercoagulable state   [] Anemic Gastrointestinal:  [] Diarrhea   [] Vomiting  [] Gastroesophageal reflux/heartburn   [] Difficulty swallowing. Genitourinary:  [] Chronic kidney disease   [] Difficult urination  [] Frequent urination   [] Blood in urine Skin:  [] Rashes   [] Ulcers  Psychological:  [] History of anxiety   []  History of major depression.  Physical Examination  Vitals:   11/21/20 1444  BP: (!) 154/71  Pulse: 76  Weight: 121 lb (  54.9 kg)  Height: 5\' 7"  (1.702 m)   Body mass index is 18.95 kg/m. Gen: WD/WN, NAD Head: [place]/AT, No temporalis wasting.  Ear/Nose/Throat: Hearing grossly intact, nares w/o erythema or drainage Eyes: PER, EOMI, sclera nonicteric.  Neck: Supple, no masses.  No bruit or JVD.  Pulmonary:  Good air movement, no audible wheezing, no use of accessory muscles.  Cardiac: RRR, normal S1, S2, no Murmurs. Vascular:   Vessel Right Left  Radial Palpable Palpable  Gastrointestinal: soft, non-distended. No guarding/no peritoneal signs.  Musculoskeletal: M/S 5/5 throughout.  No visible deformity.  Neurologic: CN 2-12 intact. Pain and light touch intact in extremities.  Symmetrical.  Speech is fluent. Motor exam as listed above. Psychiatric: Judgment intact, Mood & affect appropriate for pt's clinical situation. Dermatologic: No rashes or ulcers noted.  No changes consistent with cellulitis.   CBC Lab Results  Component Value Date   WBC 5.7 11/18/2020   HGB 9.8 (L) 11/18/2020   HCT 28.7 (L) 11/18/2020   MCV 88.3 11/18/2020   PLT 343 11/18/2020    BMET    Component Value Date/Time   NA 133 (L) 11/18/2020 1318   NA 138  07/06/2020 1505   K 3.3 (L) 11/18/2020 1318   CL 98 11/18/2020 1318   CO2 29 11/18/2020 1318   GLUCOSE 112 (H) 11/18/2020 1318   BUN 17 11/18/2020 1318   BUN 18 07/06/2020 1505   CREATININE 1.13 11/18/2020 1318   CALCIUM 8.5 (L) 11/18/2020 1318   GFRNONAA >60 11/18/2020 1318   GFRAA 78 10/30/2019 1424   Estimated Creatinine Clearance: 43.9 mL/min (by C-G formula based on SCr of 1.13 mg/dL).  COAG Lab Results  Component Value Date   INR 0.97 11/08/2017    Radiology PERIPHERAL VASCULAR CATHETERIZATION  Result Date: 11/08/2020 See surgical note for result.    Assessment/Plan 1. Celiac artery stenosis (HCC) Recommend:   The patient has evidence of severe atherosclerotic changes of the celiac artery associated with weight loss as well as abdominal pain and N/V.  This represents a high risk for bowel infarction and death.   Patient should undergo angiography of the celiac artery but from an arm approach to allow for better leverage and give me the ability to cross the lesion.  Angiography is being done with the hope for intervention of the celiac artery to eliminate the ischemic symptoms.     The risks and benefits as well as the alternative therapies was discussed in detail with the patient.  All questions were answered.  Patient agrees to proceed with angiography and intervention.   The patient will follow up with me after the angiogram.    2. PVD (peripheral vascular disease) with claudication ([place])  Recommend:   The patient has evidence of atherosclerosis of the lower extremities with claudication.  The patient does not voice lifestyle limiting changes at this point in time.   Noninvasive studies do not suggest clinically significant change.   No invasive studies, angiography or surgery at this time The patient should continue walking and begin a more formal exercise program.  The patient should continue antiplatelet therapy and aggressive treatment of the lipid  abnormalities   No changes in the patient's medications at this time   3. COPD (chronic obstructive pulmonary disease) with chronic bronchitis (HCC) Continue pulmonary medications and aerosols as already ordered, these medications have been reviewed and there are no changes at this time.     4. Type 2 diabetes mellitus with stage 3 chronic kidney  disease, without long-term current use of insulin, unspecified whether stage 3a or 3b CKD ([place]) Continue hypoglycemic medications as already ordered, these medications have been reviewed and there are no changes at this time.   Hgb A1C to be monitored as already arranged by primary service    5. Hyperlipidemia, unspecified hyperlipidemia type Continue statin as ordered and reviewed, no changes at this time    [name], MD  11/25/2020 9:47 AM

## 2020-11-25 NOTE — Interval H&P Note (Signed)
History and Physical Interval Note:  11/25/2020 9:54 AM  Todd Whitehead  has presented today for surgery, with the diagnosis of Celiac Stent   Mesenteric Ischemia.  The various methods of treatment have been discussed with the patient and family. After consideration of risks, benefits and other options for treatment, the patient has consented to  Procedure(s): VISCERAL ANGIOGRAPHY (N/A) as a surgical intervention.  The patient's history has been reviewed, patient examined, no change in status, stable for surgery.  I have reviewed the patient's chart and labs.  Questions were answered to the patient's satisfaction.     [name]

## 2020-11-25 NOTE — Progress Notes (Signed)
MRN : [id number]  Todd Whitehead is a 75 y.o. (01/14/1946) male who presents with chief complaint of belly hurts.  History of Present Illness:  The patient returns to the office for followup and review status post angiogram without intervention 11/08/2020.  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  The patient continues to have unrelenting chronic abdominal pain.  There have been no significant changes to the patient's overall health care.  The patient denies amaurosis fugax or recent TIA symptoms. There are no recent neurological changes noted. The patient denies history of DVT, PE or superficial thrombophlebitis. The patient denies recent episodes of angina or shortness of breath.     Current Meds  Medication Sig   acetaminophen (TYLENOL) 500 MG tablet Take 1 tablet (500 mg total) by mouth every 6 (six) hours as needed. (Patient taking differently: Take 500 mg by mouth every 6 (six) hours as needed for moderate pain or headache.)   ALPRAZolam (XANAX) 0.5 MG tablet Take 0.25 mg by mouth at bedtime as needed for sleep.   aspirin EC 81 MG tablet Take 81 mg by mouth daily. Swallow whole.   atorvastatin (LIPITOR) 40 MG tablet Take 40 mg by mouth daily.    brimonidine (ALPHAGAN) 0.2 % ophthalmic solution Place 1 drop into both eyes 2 (two) times daily.   busPIRone (BUSPAR) 30 MG tablet Take 30 mg by mouth 2 (two) times daily.   citalopram (CELEXA) 20 MG tablet Take 20 mg by mouth daily.   latanoprost (XALATAN) 0.005 % ophthalmic solution Place 1 drop into both eyes at bedtime.    metoprolol succinate (TOPROL-XL) 25 MG 24 hr tablet Take 12.5 mg by mouth daily.   QUEtiapine (SEROQUEL) 25 MG tablet Take 25 mg by mouth at bedtime.   sildenafil (REVATIO) 20 MG tablet TAKE 2 TO 5 TABLETS AS NEEDED FOR ERECTION (Patient not taking: Reported on 11/22/2020)   tamsulosin (FLOMAX) 0.4 MG CAPS  capsule Take 1 capsule (0.4 mg total) by mouth daily after breakfast.   traMADol (ULTRAM) 50 MG tablet Take 50 mg by mouth 2 (two) times daily.   traZODone (DESYREL) 50 MG tablet Take 50 mg by mouth at bedtime.   [DISCONTINUED] ALPRAZolam (XANAX) 0.5 MG tablet Take by mouth.   [DISCONTINUED] clopidogrel (PLAVIX) 75 MG tablet Take 75 mg by mouth daily.    Past Medical History:  Diagnosis Date   Anxiety    Arthritis    Colon polyp    COPD (chronic obstructive pulmonary disease) ([place])    smoker   Dysrhythmia    Glaucoma    Heart murmur    Hypertension    on  no meds   Low serum vitamin B12 11/18/2020   Melanoma ([place])    Resected from Left upper arm.    Myocardial infarction [facility]) 2017   Peripheral vascular disease [facility])    Prostate enlargement     Past Surgical History:  Procedure Laterality Date   BACK SURGERY  01/19/98   CARDIAC CATHETERIZATION N/A 08/19/2015   Procedure: Left Heart Cath and Coronary Angiography;  Surgeon: [name], MD;  Location: [place] CV LAB;  Service: Cardiovascular;  Laterality: N/A;   CHOLECYSTECTOMY N/A 08/19/2016   Procedure: LAPAROSCOPIC CHOLECYSTECTOMY WITH INTRAOPERATIVE CHOLANGIOGRAM;  Surgeon: [name], MD;  Location: [place];  Service: General;  Laterality: N/A;   COLON SURGERY  age  72 days old   " bowel was twisted up"   COLONOSCOPY WITH PROPOFOL N/A 03/19/2016   Procedure: COLONOSCOPY WITH PROPOFOL;  Surgeon: [name], MD;  Location: [facility] ENDOSCOPY;  Service: Endoscopy;  Laterality: N/A;   CORONARY ARTERY BYPASS GRAFT  2017   ERCP N/A 08/18/2016   Procedure: ENDOSCOPIC RETROGRADE CHOLANGIOPANCREATOGRAPHY (ERCP);  Surgeon: [name], MD;  Location: [place];  Service: Gastroenterology;  Laterality: N/A;   ESOPHAGOGASTRODUODENOSCOPY (EGD) WITH PROPOFOL N/A 09/18/2018   Procedure: ESOPHAGOGASTRODUODENOSCOPY (EGD) WITH PROPOFOL;  Surgeon: [name], DO;  Location: [place];  Service: General;  Laterality: N/A;    EYE SURGERY Left 03/26/11   LOWER EXTREMITY ANGIOGRAPHY Left 10/03/2017   Procedure: LOWER EXTREMITY ANGIOGRAPHY;  Surgeon: [name], MD;  Location: [place] CV LAB;  Service: Cardiovascular;  Laterality: Left;   LOWER EXTREMITY ANGIOGRAPHY Right 10/31/2017   Procedure: LOWER EXTREMITY ANGIOGRAPHY;  Surgeon: [name], MD;  Location: [place] CV LAB;  Service: Cardiovascular;  Laterality: Right;   LUMBAR LAMINECTOMY/DECOMPRESSION MICRODISCECTOMY Right 10/02/2012   Procedure: Right Lumbar Five-Sacral One Lumbar laminotomy/Microdiskectomy;  Surgeon: [name], MD;  Location: [place] NEURO ORS;  Service: Neurosurgery;  Laterality: Right;  Right Lumbar Five-Sacral One Lumbar laminotomy/Microdiskectomy   VISCERAL ANGIOGRAPHY N/A 11/08/2020   Procedure: VISCERAL ANGIOGRAPHY;  Surgeon: Katha Cabal, MD;  Location: [place] CV LAB;  Service: Cardiovascular;  Laterality: N/A;    Social History Social History   Tobacco Use   Smoking status: Every Day    Packs/day: 1.50    Years: 58.00    Pack years: 87.00    Types: Cigarettes   Smokeless tobacco: Never  Vaping Use   Vaping Use: Former  Substance Use Topics   Alcohol use: No   Drug use: No    Family History Family History  Problem Relation Age of Onset   Heart attack Father    Aneurysm Brother    Lung cancer Brother     Allergies  Allergen Reactions   Other Nausea And Vomiting    Cheese,butter,sour cream,cottage cheese   Poison Ivy Extract Rash   Poison Oak Extract Rash     REVIEW OF SYSTEMS (Negative unless checked)  Constitutional: [] Weight loss  [] Fever  [] Chills Cardiac: [] Chest pain   [] Chest pressure   [] Palpitations   [] Shortness of breath when laying flat   [] Shortness of breath with exertion. Vascular:  [] Pain in legs with walking   [] Pain in legs at rest  [] History of DVT   [] Phlebitis   [] Swelling in legs   [] Varicose veins   [] Non-healing ulcers Pulmonary:   [] Uses home oxygen   [] Productive  cough   [] Hemoptysis   [] Wheeze  [] COPD   [] Asthma Neurologic:  [] Dizziness   [] Seizures   [] History of stroke   [] History of TIA  [] Aphasia   [] Vissual changes   [] Weakness or numbness in arm   [] Weakness or numbness in leg Musculoskeletal:   [] Joint swelling   [] Joint pain   [] Low back pain Hematologic:  [] Easy bruising  [] Easy bleeding   [] Hypercoagulable state   [] Anemic Gastrointestinal:  [] Diarrhea   [] Vomiting  [] Gastroesophageal reflux/heartburn   [] Difficulty swallowing. Genitourinary:  [] Chronic kidney disease   [] Difficult urination  [] Frequent urination   [] Blood in urine Skin:  [] Rashes   [] Ulcers  Psychological:  [] History of anxiety   []  History of major depression.  Physical Examination  Vitals:   11/21/20 1444  BP: (!) 154/71  Pulse: 76  Weight: 121 lb (  54.9 kg)  Height: 5\' 7"  (1.702 m)   Body mass index is 18.95 kg/m. Gen: WD/WN, NAD Head: [place]/AT, No temporalis wasting.  Ear/Nose/Throat: Hearing grossly intact, nares w/o erythema or drainage Eyes: PER, EOMI, sclera nonicteric.  Neck: Supple, no masses.  No bruit or JVD.  Pulmonary:  Good air movement, no audible wheezing, no use of accessory muscles.  Cardiac: RRR, normal S1, S2, no Murmurs. Vascular:   Vessel Right Left  Radial Palpable Palpable  Gastrointestinal: soft, non-distended. No guarding/no peritoneal signs.  Musculoskeletal: M/S 5/5 throughout.  No visible deformity.  Neurologic: CN 2-12 intact. Pain and light touch intact in extremities.  Symmetrical.  Speech is fluent. Motor exam as listed above. Psychiatric: Judgment intact, Mood & affect appropriate for pt's clinical situation. Dermatologic: No rashes or ulcers noted.  No changes consistent with cellulitis.   CBC Lab Results  Component Value Date   WBC 5.7 11/18/2020   HGB 9.8 (L) 11/18/2020   HCT 28.7 (L) 11/18/2020   MCV 88.3 11/18/2020   PLT 343 11/18/2020    BMET    Component Value Date/Time   NA 133 (L) 11/18/2020 1318   NA 138  07/06/2020 1505   K 3.3 (L) 11/18/2020 1318   CL 98 11/18/2020 1318   CO2 29 11/18/2020 1318   GLUCOSE 112 (H) 11/18/2020 1318   BUN 17 11/18/2020 1318   BUN 18 07/06/2020 1505   CREATININE 1.13 11/18/2020 1318   CALCIUM 8.5 (L) 11/18/2020 1318   GFRNONAA >60 11/18/2020 1318   GFRAA 78 10/30/2019 1424   Estimated Creatinine Clearance: 43.9 mL/min (by C-G formula based on SCr of 1.13 mg/dL).  COAG Lab Results  Component Value Date   INR 0.97 11/08/2017    Radiology PERIPHERAL VASCULAR CATHETERIZATION  Result Date: 11/08/2020 See surgical note for result.    Assessment/Plan 1. Celiac artery stenosis (HCC) Recommend:   The patient has evidence of severe atherosclerotic changes of the celiac artery associated with weight loss as well as abdominal pain and N/V.  This represents a high risk for bowel infarction and death.   Patient should undergo angiography of the celiac artery but from an arm approach to allow for better leverage and give me the ability to cross the lesion.  Angiography is being done with the hope for intervention of the celiac artery to eliminate the ischemic symptoms.     The risks and benefits as well as the alternative therapies was discussed in detail with the patient.  All questions were answered.  Patient agrees to proceed with angiography and intervention.   The patient will follow up with me after the angiogram.    2. PVD (peripheral vascular disease) with claudication ([place])  Recommend:   The patient has evidence of atherosclerosis of the lower extremities with claudication.  The patient does not voice lifestyle limiting changes at this point in time.   Noninvasive studies do not suggest clinically significant change.   No invasive studies, angiography or surgery at this time The patient should continue walking and begin a more formal exercise program.  The patient should continue antiplatelet therapy and aggressive treatment of the lipid  abnormalities   No changes in the patient's medications at this time   3. COPD (chronic obstructive pulmonary disease) with chronic bronchitis (HCC) Continue pulmonary medications and aerosols as already ordered, these medications have been reviewed and there are no changes at this time.     4. Type 2 diabetes mellitus with stage 3 chronic kidney  disease, without long-term current use of insulin, unspecified whether stage 3a or 3b CKD ([place]) Continue hypoglycemic medications as already ordered, these medications have been reviewed and there are no changes at this time.   Hgb A1C to be monitored as already arranged by primary service    5. Hyperlipidemia, unspecified hyperlipidemia type Continue statin as ordered and reviewed, no changes at this time    [name], MD  11/25/2020 9:47 AM

## 2020-11-28 ENCOUNTER — Encounter: Payer: Self-pay | Admitting: Vascular Surgery

## 2020-11-30 ENCOUNTER — Telehealth (INDEPENDENT_AMBULATORY_CARE_PROVIDER_SITE_OTHER): Payer: Self-pay

## 2020-11-30 ENCOUNTER — Inpatient Hospital Stay: Payer: Medicare Other | Attending: Oncology

## 2020-11-30 ENCOUNTER — Other Ambulatory Visit: Payer: Self-pay

## 2020-11-30 DIAGNOSIS — E538 Deficiency of other specified B group vitamins: Secondary | ICD-10-CM | POA: Diagnosis present

## 2020-11-30 MED ORDER — CYANOCOBALAMIN 1000 MCG/ML IJ SOLN
1000.0000 ug | Freq: Once | INTRAMUSCULAR | Status: AC
  Administered 2020-11-30: 1000 ug via INTRAMUSCULAR
  Filled 2020-11-30: qty 1

## 2020-11-30 NOTE — Telephone Encounter (Signed)
We typically give plavix and aspirin for stent patency.  It appears that plavix was ordered yesterday and sent to his pharmacy.  In looking at phone notes from Dr. Tonette Bihari office, sildenafil was referenced.  We will not be sending that in because it is not indicated for stent patency.

## 2020-11-30 NOTE — Telephone Encounter (Signed)
Gregary Signs from [facility] Dr. Ruthann [facility] Anderson's office called and left a Vm on the nurses line wanting to know were we going to give the pt medication from the procedure the pt had on 10/28 a visceral angio to help with the blood flow and keep the stents open. Please advise.

## 2020-11-30 NOTE — Telephone Encounter (Signed)
I called and made Juila at [facility] clinic internal medicine aware of the NP's instructions.

## 2020-12-07 ENCOUNTER — Encounter: Payer: Self-pay | Admitting: Oncology

## 2020-12-07 ENCOUNTER — Other Ambulatory Visit: Payer: Self-pay

## 2020-12-07 ENCOUNTER — Inpatient Hospital Stay: Payer: Medicare Other

## 2020-12-07 ENCOUNTER — Inpatient Hospital Stay: Payer: Medicare Other | Admitting: Oncology

## 2020-12-07 VITALS — BP 150/79 | HR 80 | Temp 97.1°F | Resp 18 | Wt 121.2 lb

## 2020-12-07 DIAGNOSIS — G8929 Other chronic pain: Secondary | ICD-10-CM | POA: Diagnosis present

## 2020-12-07 DIAGNOSIS — I4510 Unspecified right bundle-branch block: Secondary | ICD-10-CM | POA: Diagnosis present

## 2020-12-07 DIAGNOSIS — Z20822 Contact with and (suspected) exposure to covid-19: Secondary | ICD-10-CM | POA: Diagnosis present

## 2020-12-07 DIAGNOSIS — I509 Heart failure, unspecified: Secondary | ICD-10-CM | POA: Diagnosis not present

## 2020-12-07 DIAGNOSIS — R350 Frequency of micturition: Secondary | ICD-10-CM | POA: Diagnosis present

## 2020-12-07 DIAGNOSIS — I5043 Acute on chronic combined systolic (congestive) and diastolic (congestive) heart failure: Secondary | ICD-10-CM | POA: Diagnosis present

## 2020-12-07 DIAGNOSIS — I2510 Atherosclerotic heart disease of native coronary artery without angina pectoris: Secondary | ICD-10-CM | POA: Diagnosis present

## 2020-12-07 DIAGNOSIS — Z79899 Other long term (current) drug therapy: Secondary | ICD-10-CM

## 2020-12-07 DIAGNOSIS — E538 Deficiency of other specified B group vitamins: Secondary | ICD-10-CM

## 2020-12-07 DIAGNOSIS — R5381 Other malaise: Secondary | ICD-10-CM | POA: Diagnosis present

## 2020-12-07 DIAGNOSIS — D472 Monoclonal gammopathy: Secondary | ICD-10-CM | POA: Diagnosis present

## 2020-12-07 DIAGNOSIS — Z8249 Family history of ischemic heart disease and other diseases of the circulatory system: Secondary | ICD-10-CM

## 2020-12-07 DIAGNOSIS — I110 Hypertensive heart disease with heart failure: Secondary | ICD-10-CM | POA: Diagnosis not present

## 2020-12-07 DIAGNOSIS — J9601 Acute respiratory failure with hypoxia: Secondary | ICD-10-CM | POA: Diagnosis present

## 2020-12-07 DIAGNOSIS — M545 Low back pain, unspecified: Secondary | ICD-10-CM | POA: Diagnosis present

## 2020-12-07 DIAGNOSIS — Z85820 Personal history of malignant melanoma of skin: Secondary | ICD-10-CM

## 2020-12-07 DIAGNOSIS — J438 Other emphysema: Secondary | ICD-10-CM | POA: Diagnosis present

## 2020-12-07 DIAGNOSIS — R634 Abnormal weight loss: Secondary | ICD-10-CM | POA: Diagnosis not present

## 2020-12-07 DIAGNOSIS — Z87891 Personal history of nicotine dependence: Secondary | ICD-10-CM | POA: Diagnosis not present

## 2020-12-07 DIAGNOSIS — D6489 Other specified anemias: Secondary | ICD-10-CM | POA: Diagnosis present

## 2020-12-07 DIAGNOSIS — I739 Peripheral vascular disease, unspecified: Secondary | ICD-10-CM | POA: Diagnosis present

## 2020-12-07 DIAGNOSIS — E876 Hypokalemia: Secondary | ICD-10-CM | POA: Diagnosis present

## 2020-12-07 DIAGNOSIS — Z951 Presence of aortocoronary bypass graft: Secondary | ICD-10-CM

## 2020-12-07 DIAGNOSIS — D649 Anemia, unspecified: Secondary | ICD-10-CM

## 2020-12-07 DIAGNOSIS — Z7982 Long term (current) use of aspirin: Secondary | ICD-10-CM

## 2020-12-07 DIAGNOSIS — N400 Enlarged prostate without lower urinary tract symptoms: Secondary | ICD-10-CM | POA: Diagnosis present

## 2020-12-07 DIAGNOSIS — Z801 Family history of malignant neoplasm of trachea, bronchus and lung: Secondary | ICD-10-CM

## 2020-12-07 DIAGNOSIS — Q2112 Patent foramen ovale: Secondary | ICD-10-CM

## 2020-12-07 DIAGNOSIS — E785 Hyperlipidemia, unspecified: Secondary | ICD-10-CM | POA: Diagnosis present

## 2020-12-07 DIAGNOSIS — F32A Depression, unspecified: Secondary | ICD-10-CM | POA: Diagnosis present

## 2020-12-07 DIAGNOSIS — I248 Other forms of acute ischemic heart disease: Secondary | ICD-10-CM | POA: Diagnosis present

## 2020-12-07 DIAGNOSIS — F419 Anxiety disorder, unspecified: Secondary | ICD-10-CM | POA: Diagnosis present

## 2020-12-07 DIAGNOSIS — F17210 Nicotine dependence, cigarettes, uncomplicated: Secondary | ICD-10-CM | POA: Diagnosis present

## 2020-12-07 DIAGNOSIS — I252 Old myocardial infarction: Secondary | ICD-10-CM

## 2020-12-07 DIAGNOSIS — Z7902 Long term (current) use of antithrombotics/antiplatelets: Secondary | ICD-10-CM

## 2020-12-07 MED ORDER — CYANOCOBALAMIN 1000 MCG/ML IJ SOLN
1000.0000 ug | Freq: Once | INTRAMUSCULAR | Status: AC
  Administered 2020-12-07: 1000 ug via INTRAMUSCULAR
  Filled 2020-12-07: qty 1

## 2020-12-07 NOTE — Progress Notes (Signed)
Pt reports abd pain after stent placed 11/25/20. Denies N/V/D.

## 2020-12-07 NOTE — Addendum Note (Signed)
Addended by: Earlie Server on: 12/07/2020 08:57 PM   Modules accepted: Orders

## 2020-12-07 NOTE — Progress Notes (Signed)
Hematology/Oncology Consult note [facility] Telephone:([phone] [phone]   Patient Care Team: [name], MD as PCP - General (Internal Medicine) Earlie Server, MD as Consulting Physician (Hematology and Oncology)  REFERRING PROVIDER: [name], MD  CHIEF COMPLAINTS/REASON FOR VISIT:  Anemia and vitamin B12 deficiency  HISTORY OF PRESENTING ILLNESS:   Todd Whitehead is a  75 y.o.  male with PMH listed below was seen in consultation at the request of  [name], MD  for evaluation of anemia. Patient was accompanied by her friend Ms. Williams. 8/31 2022, CBC showed a hemoglobin of 10.3, hematocrit 29.5, WBC 5.7  10/20/2020, iron panel showed decreased iron saturation of 12, ferritin 140, TIBC 265.  Patient reports feeling fatigued and tired.  Denies any hematemesis or hematochezia. Patient has chronic abdominal pain, and only is able to eat 1 meal per day.  Patient has significant weight loss as well as some nausea.  07/06/2020 CT abdomen pelvis showed no acute findings.  No explanation of patient's unintended weight loss.  Mucous plugging seen within the right lower lobe bronchi with minimal right base atelectasis or early infiltrate. Patient follows up with gastroenterology Dr.Tahiliani, has also been seen by surgery to [facility].  Surgical intervention was not recommended.   09/13/2020, CT abdomen pelvis with contrast at [facility] showed extensive atherosclerotic disease.  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  Previous cholecystectomy.  Prominence, bile duct.  Cyst of both kidneys.  Negative for hydronephrosis.  Moderate distention urinary bladder.  Questionable bladder outlet obstruction.  Prostate gland is mildly enlarged and heterogeneous. Patient was last seen by Dr. Bonna Gains on 10/20/2020.  GI recommends EGD and colonoscopy for further evaluation of his symptoms. Patient was seen by vascular surgery Dr.Schnier on  10/24/2020.  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 11/08/2020, aortogram showed a diffusely diseased no hemodynamically significant lesions identified.  SMA was widely patent.  Celiac demonstrates greater than 90% stenosis at its origin but distal to the stenosis appears to be widely patent.  Patient will be rescheduled with plan of upper extremity access which will allow leverage enabling treatment of the lesion and revascularization and stent placement.  Patient was referred to establish care with hematology for evaluation of anemia.  INTERVAL HISTORY Todd Whitehead is a 75 y.o. male who has above history reviewed by me today presents for follow up visit for anemia Patient has had blood work done since last visit and present to discuss results.  He has no new concerns.   Review of Systems  Constitutional:  Positive for appetite change, fatigue and unexpected weight change. Negative for chills, diaphoresis and fever.  HENT:   Negative for hearing loss, lump/mass, nosebleeds and sore throat.   Eyes:  Negative for eye problems and icterus.  Respiratory:  Negative for chest tightness, cough, hemoptysis, shortness of breath and wheezing.   Cardiovascular:  Negative for chest pain and leg swelling.  Gastrointestinal:  Positive for abdominal pain. Negative for abdominal distention, blood in stool, diarrhea, nausea and rectal pain.  Endocrine: Negative for hot flashes.  Genitourinary:  Negative for bladder incontinence, difficulty urinating, dysuria, frequency, hematuria and nocturia.   Musculoskeletal:  Negative for back pain, flank pain, gait problem and myalgias.  Skin:  Negative for rash.  Neurological:  Negative for dizziness, gait problem, headaches, numbness and seizures.  Hematological:  Negative for adenopathy. Does not bruise/bleed easily.  Psychiatric/Behavioral:  Negative for confusion and decreased concentration. The patient is  not nervous/anxious.    MEDICAL HISTORY:   Past Medical History:  Diagnosis Date   Anxiety    Arthritis    Colon polyp    COPD (chronic obstructive pulmonary disease) ([place])    smoker   Dysrhythmia    Glaucoma    Heart murmur    Hypertension    on  no meds   Low serum vitamin B12 11/18/2020   Melanoma ([place])    Resected from Left upper arm.    Myocardial infarction [facility]) 2017   Peripheral vascular disease [facility])    Prostate enlargement     SURGICAL HISTORY: Past Surgical History:  Procedure Laterality Date   BACK SURGERY  01/19/98   CARDIAC CATHETERIZATION N/A 08/19/2015   Procedure: Left Heart Cath and Coronary Angiography;  Surgeon: [name], MD;  Location: [place] CV [facility];  Service: Cardiovascular;  Laterality: N/A;   CHOLECYSTECTOMY N/A 08/19/2016   Procedure: LAPAROSCOPIC CHOLECYSTECTOMY WITH INTRAOPERATIVE CHOLANGIOGRAM;  Surgeon: [name], MD;  Location: [place];  Service: General;  Laterality: N/A;   COLON SURGERY  age 12 days old   " bowel was twisted up"   COLONOSCOPY WITH PROPOFOL N/A 03/19/2016   Procedure: COLONOSCOPY WITH PROPOFOL;  Surgeon: [name], MD;  Location: [facility] ENDOSCOPY;  Service: Endoscopy;  Laterality: N/A;   CORONARY ARTERY BYPASS GRAFT  2017   ERCP N/A 08/18/2016   Procedure: ENDOSCOPIC RETROGRADE CHOLANGIOPANCREATOGRAPHY (ERCP);  Surgeon: [name], MD;  Location: [place];  Service: Gastroenterology;  Laterality: N/A;   ESOPHAGOGASTRODUODENOSCOPY (EGD) WITH PROPOFOL N/A 09/18/2018   Procedure: ESOPHAGOGASTRODUODENOSCOPY (EGD) WITH PROPOFOL;  Surgeon: [name], DO;  Location: [place];  Service: General;  Laterality: N/A;   EYE SURGERY Left 03/26/11   LOWER EXTREMITY ANGIOGRAPHY Left 10/03/2017   Procedure: LOWER EXTREMITY ANGIOGRAPHY;  Surgeon: [name], MD;  Location: [place] [facility];  Service: Cardiovascular;  Laterality: Left;   LOWER EXTREMITY ANGIOGRAPHY Right 10/31/2017   Procedure: LOWER EXTREMITY ANGIOGRAPHY;  Surgeon: [name], MD;   Location: [place] [facility];  Service: Cardiovascular;  Laterality: Right;   LUMBAR LAMINECTOMY/DECOMPRESSION MICRODISCECTOMY Right 10/02/2012   Procedure: Right Lumbar Five-Sacral One Lumbar laminotomy/Microdiskectomy;  Surgeon: [name], MD;  Location: [place] [facility];  Service: Neurosurgery;  Laterality: Right;  Right Lumbar Five-Sacral One Lumbar laminotomy/Microdiskectomy   VISCERAL ANGIOGRAPHY N/A 11/08/2020   Procedure: VISCERAL ANGIOGRAPHY;  Surgeon: Katha Cabal, MD;  Location: [place] [facility];  Service: Cardiovascular;  Laterality: N/A;   VISCERAL ANGIOGRAPHY N/A 11/25/2020   Procedure: VISCERAL ANGIOGRAPHY;  Surgeon: Katha Cabal, MD;  Location: [place] [facility];  Service: Cardiovascular;  Laterality: N/A;    SOCIAL HISTORY: Social History   Socioeconomic History   Marital status: Divorced    Spouse name: Not on file   Number of children: Not on file   Years of education: Not on file   Highest education level: Not on file  Occupational History   Not on file  Tobacco Use   Smoking status: Every Day    Packs/day: 1.50    Years: 58.00    Pack years: 87.00    Types: Cigarettes   Smokeless tobacco: Never  Vaping Use   Vaping Use: Former  Substance and Sexual Activity   Alcohol use: No   Drug use: No   Sexual activity: Not Currently  Other Topics Concern   Not on file  Social History Narrative   Not on file   Social Determinants of  Health   Financial Resource Strain: Not on file  Food Insecurity: Not on file  Transportation Needs: Not on file  Physical Activity: Not on file  Stress: Not on file  Social Connections: Not on file  Intimate Partner Violence: Not on file    FAMILY HISTORY: Family History  Problem Relation Age of Onset   Heart attack Father    Aneurysm Brother    Lung cancer Brother     ALLERGIES:  is allergic to other, poison ivy extract, and poison oak extract.  MEDICATIONS:  Current Outpatient Medications   Medication Sig Dispense Refill   acetaminophen (TYLENOL) 500 MG tablet Take 1 tablet (500 mg total) by mouth every 6 (six) hours as needed. (Patient taking differently: Take 500 mg by mouth every 6 (six) hours as needed for moderate pain or headache.) 30 tablet 0   ALPRAZolam (XANAX) 0.5 MG tablet Take 0.25 mg by mouth at bedtime as needed for sleep.     aspirin EC 81 MG tablet Take 81 mg by mouth daily. Swallow whole.     atorvastatin (LIPITOR) 40 MG tablet Take 40 mg by mouth daily.      brimonidine (ALPHAGAN) 0.2 % ophthalmic solution Place 1 drop into both eyes 2 (two) times daily.     busPIRone (BUSPAR) 30 MG tablet Take 30 mg by mouth 2 (two) times daily.     citalopram (CELEXA) 20 MG tablet Take 20 mg by mouth daily.     clopidogrel (PLAVIX) 75 MG tablet Take 1 tablet (75 mg total) by mouth daily. 30 tablet 5   latanoprost (XALATAN) 0.005 % ophthalmic solution Place 1 drop into both eyes at bedtime.   2   metoprolol succinate (TOPROL-XL) 25 MG 24 hr tablet Take 12.5 mg by mouth daily.     QUEtiapine (SEROQUEL) 25 MG tablet Take 25 mg by mouth at bedtime.     tamsulosin (FLOMAX) 0.4 MG CAPS capsule Take 1 capsule (0.4 mg total) by mouth daily after breakfast. 30 capsule 2   traMADol (ULTRAM) 50 MG tablet Take 50 mg by mouth 2 (two) times daily.  2   traZODone (DESYREL) 50 MG tablet Take 50 mg by mouth at bedtime.     sildenafil (REVATIO) 20 MG tablet TAKE 2 TO 5 TABLETS AS NEEDED FOR ERECTION (Patient not taking: No sig reported) 30 tablet 3   No current facility-administered medications for this visit.     PHYSICAL EXAMINATION: ECOG PERFORMANCE STATUS: 1 - Symptomatic but completely ambulatory Vitals:   12/07/20 1306  BP: (!) 150/79  Pulse: 80  Resp: 18  Temp: (!) 97.1 F (36.2 C)  SpO2: 96%   Filed Weights   12/07/20 1306  Weight: 121 lb 3.2 oz (55 kg)    Physical Exam Constitutional:      General: He is not in acute distress.    Comments: Thin, ambulates  independantly  HENT:     Head: Normocephalic and atraumatic.  Eyes:     General: No scleral icterus. Cardiovascular:     Rate and Rhythm: Normal rate and regular rhythm.     Heart sounds: Normal heart sounds.  Pulmonary:     Effort: Pulmonary effort is normal. No respiratory distress.     Breath sounds: No wheezing.  Abdominal:     General: Bowel sounds are normal. There is no distension.     Palpations: Abdomen is soft.  Musculoskeletal:        General: No deformity. Normal range of motion.  Cervical back: Normal range of motion and neck supple.  Skin:    General: Skin is warm and dry.     Findings: No erythema or rash.  Neurological:     Mental Status: He is alert and oriented to person, place, and time. Mental status is at baseline.     Cranial Nerves: No cranial nerve deficit.     Coordination: Coordination normal.  Psychiatric:        Mood and Affect: Mood normal.    LABORATORY DATA:  I have reviewed the data as listed Lab Results  Component Value Date   WBC 5.7 11/18/2020   HGB 9.8 (L) 11/18/2020   HCT 28.7 (L) 11/18/2020   MCV 88.3 11/18/2020   PLT 343 11/18/2020   Recent Labs    07/06/20 1505 07/06/20 1714 11/08/20 0749 11/18/20 1318 11/25/20 0918  NA 138  --   --  133*  --   K 3.6  --   --  3.3*  --   CL 98  --   --  98  --   CO2 25  --   --  29  --   GLUCOSE 107*  --   --  112*  --   BUN 18  --  $R'17 17 15  'Or$ CREATININE 1.05   < > 1.35* 1.13 1.04  CALCIUM 9.0  --   --  8.5*  --   GFRNONAA  --   --  55* >60 >60  PROT 6.3  --   --  6.9  --   ALBUMIN 4.1  --   --  3.1*  --   AST 25  --   --  29  --   ALT 24  --   --  17  --   ALKPHOS 109  --   --  88  --   BILITOT 0.3  --   --  0.6  --    < > = values in this interval not displayed.    Iron/TIBC/Ferritin/ %Sat    Component Value Date/Time   IRON 67 11/18/2020 1318   IRON 33 (L) 10/20/2020 1335   TIBC 281 11/18/2020 1318   TIBC 265 10/20/2020 1335   FERRITIN 234 11/18/2020 1318   FERRITIN  140 10/20/2020 1335   IRONPCTSAT 24 11/18/2020 1318   IRONPCTSAT 12 (L) 10/20/2020 1335       RADIOGRAPHIC STUDIES: I have personally reviewed the radiological images as listed and agreed with the findings in the report. PERIPHERAL VASCULAR CATHETERIZATION  Result Date: 11/25/2020 See surgical note for result.  PERIPHERAL VASCULAR CATHETERIZATION  Result Date: 11/08/2020 See surgical note for result.     ASSESSMENT & PLAN:  1. MGUS (monoclonal gammopathy of unknown significance)   2. Anemia, unspecified type   3. Personal history of tobacco use, presenting hazards to health   4. Weight loss    #Vitamin B12 deficiency. Patient has started on vitamin B12 parenteral intramuscular injection 1000 MCG weekly x2.  Continue another 2 weekly treatments followed by monthly B12 injections.  #Normocytic anemia, hemoglobin 9.8. Smear showed acanthocytes.  LDH normal, reticulocyte panel showed normal reticulocyte hemoglobin, reticulocyte percentage inappropriately normal.  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  Light Chain ratio 2.45.  I will order UPEP.  Patient has anemia, no kidney function impairment, hypercalcemia. Continue observation. Check skeletal survey  #Unintentional weight loss Patient has had CT abdomen pelvis done on 07/06/2020  which showed no explanation for his unintended weight loss. 09/03/2020, CT chest lung cancer screening low-dose showed benign appearance. Patient is due for lung cancer screening.  We will refer him to lung cancer screening program.  # History of melenoma  Orders Placed This Encounter  Procedures   IFE+PROTEIN ELECTRO, 24-HR UR    Standing Status:   Future    Standing Expiration Date:   12/07/2021   CBC with Differential/Platelet    Standing Status:   Future    Standing Expiration Date:   12/07/2021   Vitamin B12    Standing Status:   Future    Standing Expiration Date:    12/07/2021   Ferritin    Standing Status:   Future    Standing Expiration Date:   12/07/2021   Iron and TIBC    Standing Status:   Future    Standing Expiration Date:   12/07/2021   Retic Panel    Standing Status:   Future    Standing Expiration Date:   12/07/2021   Copper, serum    Standing Status:   Future    Standing Expiration Date:   12/07/2021    All questions were answered. The patient knows to call the clinic with any problems questions or concerns.  cc [name], MD    Return of visit: 3 months   Earlie Server, MD, PhD 12/07/2020

## 2020-12-08 ENCOUNTER — Telehealth: Payer: Self-pay

## 2020-12-08 NOTE — Telephone Encounter (Signed)
Called pt and VM is full, unable to leave message, will call again later.

## 2020-12-08 NOTE — Telephone Encounter (Signed)
-----   Message from Earlie Server, MD sent at 12/07/2020  8:51 PM EST ----- Please refer him to lung caner screening program.  Also let him know that I recommend him to get skeletal survey [ need appt or walk in? ] please arrange. Ordered. Thanks.

## 2020-12-09 ENCOUNTER — Other Ambulatory Visit: Payer: Self-pay

## 2020-12-09 ENCOUNTER — Inpatient Hospital Stay
Admission: EM | Admit: 2020-12-09 | Discharge: 2020-12-12 | DRG: 291 | Disposition: A | Discharge status: Home Health | Payer: Medicare Other | Attending: Student in an Organized Health Care Education/Training Program | Admitting: Student in an Organized Health Care Education/Training Program

## 2020-12-09 ENCOUNTER — Encounter: Payer: Self-pay | Admitting: Emergency Medicine

## 2020-12-09 ENCOUNTER — Emergency Department: Payer: Medicare Other

## 2020-12-09 DIAGNOSIS — D472 Monoclonal gammopathy: Secondary | ICD-10-CM | POA: Diagnosis present

## 2020-12-09 DIAGNOSIS — I509 Heart failure, unspecified: Secondary | ICD-10-CM

## 2020-12-09 DIAGNOSIS — Z85820 Personal history of malignant melanoma of skin: Secondary | ICD-10-CM

## 2020-12-09 DIAGNOSIS — I110 Hypertensive heart disease with heart failure: Principal | ICD-10-CM | POA: Diagnosis present

## 2020-12-09 DIAGNOSIS — I5020 Unspecified systolic (congestive) heart failure: Secondary | ICD-10-CM | POA: Diagnosis not present

## 2020-12-09 DIAGNOSIS — Z20822 Contact with and (suspected) exposure to covid-19: Secondary | ICD-10-CM | POA: Diagnosis present

## 2020-12-09 DIAGNOSIS — E876 Hypokalemia: Secondary | ICD-10-CM | POA: Diagnosis present

## 2020-12-09 DIAGNOSIS — F32A Depression, unspecified: Secondary | ICD-10-CM | POA: Diagnosis present

## 2020-12-09 DIAGNOSIS — Z951 Presence of aortocoronary bypass graft: Secondary | ICD-10-CM

## 2020-12-09 DIAGNOSIS — I4510 Unspecified right bundle-branch block: Secondary | ICD-10-CM | POA: Diagnosis present

## 2020-12-09 DIAGNOSIS — Q2112 Patent foramen ovale: Secondary | ICD-10-CM | POA: Diagnosis not present

## 2020-12-09 DIAGNOSIS — G8929 Other chronic pain: Secondary | ICD-10-CM | POA: Diagnosis present

## 2020-12-09 DIAGNOSIS — Z801 Family history of malignant neoplasm of trachea, bronchus and lung: Secondary | ICD-10-CM | POA: Diagnosis not present

## 2020-12-09 DIAGNOSIS — R0902 Hypoxemia: Secondary | ICD-10-CM

## 2020-12-09 DIAGNOSIS — N400 Enlarged prostate without lower urinary tract symptoms: Secondary | ICD-10-CM | POA: Diagnosis present

## 2020-12-09 DIAGNOSIS — Z7982 Long term (current) use of aspirin: Secondary | ICD-10-CM | POA: Diagnosis not present

## 2020-12-09 DIAGNOSIS — I248 Other forms of acute ischemic heart disease: Secondary | ICD-10-CM | POA: Diagnosis present

## 2020-12-09 DIAGNOSIS — D6489 Other specified anemias: Secondary | ICD-10-CM | POA: Diagnosis present

## 2020-12-09 DIAGNOSIS — R350 Frequency of micturition: Secondary | ICD-10-CM | POA: Diagnosis present

## 2020-12-09 DIAGNOSIS — Z79899 Other long term (current) drug therapy: Secondary | ICD-10-CM

## 2020-12-09 DIAGNOSIS — Z7902 Long term (current) use of antithrombotics/antiplatelets: Secondary | ICD-10-CM | POA: Diagnosis not present

## 2020-12-09 DIAGNOSIS — J438 Other emphysema: Secondary | ICD-10-CM | POA: Diagnosis present

## 2020-12-09 DIAGNOSIS — I739 Peripheral vascular disease, unspecified: Secondary | ICD-10-CM | POA: Diagnosis present

## 2020-12-09 DIAGNOSIS — D649 Anemia, unspecified: Secondary | ICD-10-CM

## 2020-12-09 DIAGNOSIS — I2510 Atherosclerotic heart disease of native coronary artery without angina pectoris: Secondary | ICD-10-CM | POA: Diagnosis present

## 2020-12-09 DIAGNOSIS — I252 Old myocardial infarction: Secondary | ICD-10-CM

## 2020-12-09 DIAGNOSIS — F419 Anxiety disorder, unspecified: Secondary | ICD-10-CM | POA: Diagnosis present

## 2020-12-09 DIAGNOSIS — F17210 Nicotine dependence, cigarettes, uncomplicated: Secondary | ICD-10-CM | POA: Diagnosis present

## 2020-12-09 DIAGNOSIS — F17200 Nicotine dependence, unspecified, uncomplicated: Secondary | ICD-10-CM

## 2020-12-09 DIAGNOSIS — I5031 Acute diastolic (congestive) heart failure: Secondary | ICD-10-CM | POA: Diagnosis not present

## 2020-12-09 DIAGNOSIS — I5043 Acute on chronic combined systolic (congestive) and diastolic (congestive) heart failure: Secondary | ICD-10-CM | POA: Diagnosis present

## 2020-12-09 DIAGNOSIS — M545 Low back pain, unspecified: Secondary | ICD-10-CM | POA: Diagnosis present

## 2020-12-09 DIAGNOSIS — R0602 Shortness of breath: Secondary | ICD-10-CM

## 2020-12-09 DIAGNOSIS — Z8249 Family history of ischemic heart disease and other diseases of the circulatory system: Secondary | ICD-10-CM

## 2020-12-09 DIAGNOSIS — J9601 Acute respiratory failure with hypoxia: Secondary | ICD-10-CM | POA: Diagnosis present

## 2020-12-09 DIAGNOSIS — E785 Hyperlipidemia, unspecified: Secondary | ICD-10-CM | POA: Diagnosis present

## 2020-12-09 DIAGNOSIS — R5381 Other malaise: Secondary | ICD-10-CM | POA: Diagnosis present

## 2020-12-09 LAB — BRAIN NATRIURETIC PEPTIDE: B Natriuretic Peptide: 1527.4 pg/mL — ABNORMAL HIGH (ref 0.0–100.0)

## 2020-12-09 LAB — COMPREHENSIVE METABOLIC PANEL
ALT: 21 U/L (ref 0–44)
AST: 30 U/L (ref 15–41)
Albumin: 3 g/dL — ABNORMAL LOW (ref 3.5–5.0)
Alkaline Phosphatase: 97 U/L (ref 38–126)
Anion gap: 5 (ref 5–15)
BUN: 14 mg/dL (ref 8–23)
CO2: 27 mmol/L (ref 22–32)
Calcium: 8.4 mg/dL — ABNORMAL LOW (ref 8.9–10.3)
Chloride: 101 mmol/L (ref 98–111)
Creatinine, Ser: 1.2 mg/dL (ref 0.61–1.24)
GFR, Estimated: >60 mL/min (ref >60)
Glucose, Bld: 111 mg/dL — ABNORMAL HIGH (ref 70–99)
Potassium: 3.3 mmol/L — ABNORMAL LOW (ref 3.5–5.1)
Sodium: 133 mmol/L — ABNORMAL LOW (ref 135–145)
Total Bilirubin: 0.6 mg/dL (ref 0.3–1.2)
Total Protein: 6.3 g/dL — ABNORMAL LOW (ref 6.5–8.1)

## 2020-12-09 LAB — TROPONIN I (HIGH SENSITIVITY)
Troponin I (High Sensitivity): 26 ng/L — ABNORMAL HIGH (ref <18)
Troponin I (High Sensitivity): 28 ng/L — ABNORMAL HIGH (ref <18)

## 2020-12-09 LAB — RESP PANEL BY RT-PCR (FLU A&B, COVID) ARPGX2
Influenza A by PCR: NEGATIVE
Influenza B by PCR: NEGATIVE
SARS Coronavirus 2 by RT PCR: NEGATIVE

## 2020-12-09 LAB — LACTIC ACID, PLASMA: Lactic Acid, Venous: 0.8 mmol/L (ref 0.5–1.9)

## 2020-12-09 LAB — CBC WITH DIFFERENTIAL/PLATELET
Abs Immature Granulocytes: 0.04 10*3/uL (ref 0.00–0.07)
Basophils Absolute: 0 10*3/uL (ref 0.0–0.1)
Basophils Relative: 1 %
Eosinophils Absolute: 0.1 10*3/uL (ref 0.0–0.5)
Eosinophils Relative: 1 %
HCT: 24.3 % — ABNORMAL LOW (ref 39.0–52.0)
Hemoglobin: 8.2 g/dL — ABNORMAL LOW (ref 13.0–17.0)
Immature Granulocytes: 1 %
Lymphocytes Relative: 26 %
Lymphs Abs: 1.3 10*3/uL (ref 0.7–4.0)
MCH: 31.4 pg (ref 26.0–34.0)
MCHC: 33.7 g/dL (ref 30.0–36.0)
MCV: 93.1 fL (ref 80.0–100.0)
Monocytes Absolute: 0.4 10*3/uL (ref 0.1–1.0)
Monocytes Relative: 7 %
Neutro Abs: 3.4 10*3/uL (ref 1.7–7.7)
Neutrophils Relative %: 64 %
Platelets: 142 10*3/uL — ABNORMAL LOW (ref 150–400)
RBC: 2.61 MIL/uL — ABNORMAL LOW (ref 4.22–5.81)
RDW: 17.2 % — ABNORMAL HIGH (ref 11.5–15.5)
WBC: 5.2 10*3/uL (ref 4.0–10.5)
nRBC: 0 % (ref 0.0–0.2)

## 2020-12-09 LAB — TYPE AND SCREEN
ABO/RH(D): O POS
Antibody Screen: NEGATIVE

## 2020-12-09 MED ORDER — ACETAMINOPHEN 325 MG PO TABS
650.0000 mg | ORAL_TABLET | Freq: Four times a day (QID) | ORAL | Status: DC | PRN
  Administered 2020-12-10 – 2020-12-12 (×5): 650 mg via ORAL
  Filled 2020-12-09 (×5): qty 2

## 2020-12-09 MED ORDER — TRAZODONE HCL 50 MG PO TABS
50.0000 mg | ORAL_TABLET | Freq: Every day | ORAL | Status: DC
  Administered 2020-12-10 – 2020-12-11 (×3): 50 mg via ORAL
  Filled 2020-12-09 (×3): qty 1

## 2020-12-09 MED ORDER — IPRATROPIUM-ALBUTEROL 0.5-2.5 (3) MG/3ML IN SOLN
3.0000 mL | Freq: Once | RESPIRATORY_TRACT | Status: AC
  Administered 2020-12-09: 3 mL via RESPIRATORY_TRACT
  Filled 2020-12-09: qty 3

## 2020-12-09 MED ORDER — TRAMADOL HCL 50 MG PO TABS
50.0000 mg | ORAL_TABLET | Freq: Two times a day (BID) | ORAL | Status: DC
  Administered 2020-12-10 – 2020-12-12 (×6): 50 mg via ORAL
  Filled 2020-12-09 (×6): qty 1

## 2020-12-09 MED ORDER — FUROSEMIDE 10 MG/ML IJ SOLN
20.0000 mg | Freq: Once | INTRAMUSCULAR | Status: AC
  Administered 2020-12-09: 20 mg via INTRAVENOUS
  Filled 2020-12-09: qty 4

## 2020-12-09 MED ORDER — TAMSULOSIN HCL 0.4 MG PO CAPS
0.4000 mg | ORAL_CAPSULE | Freq: Every day | ORAL | Status: DC
  Administered 2020-12-10 – 2020-12-12 (×3): 0.4 mg via ORAL
  Filled 2020-12-09 (×3): qty 1

## 2020-12-09 MED ORDER — QUETIAPINE FUMARATE 25 MG PO TABS
25.0000 mg | ORAL_TABLET | Freq: Every day | ORAL | Status: DC
  Administered 2020-12-10 – 2020-12-11 (×3): 25 mg via ORAL
  Filled 2020-12-09 (×3): qty 1

## 2020-12-09 MED ORDER — ATORVASTATIN CALCIUM 20 MG PO TABS
40.0000 mg | ORAL_TABLET | Freq: Every day | ORAL | Status: DC
  Administered 2020-12-09 – 2020-12-12 (×4): 40 mg via ORAL
  Filled 2020-12-09 (×4): qty 2

## 2020-12-09 MED ORDER — CITALOPRAM HYDROBROMIDE 20 MG PO TABS
20.0000 mg | ORAL_TABLET | Freq: Every day | ORAL | Status: DC
  Administered 2020-12-09 – 2020-12-12 (×4): 20 mg via ORAL
  Filled 2020-12-09 (×4): qty 1

## 2020-12-09 MED ORDER — ENOXAPARIN SODIUM 40 MG/0.4ML IJ SOSY
40.0000 mg | PREFILLED_SYRINGE | INTRAMUSCULAR | Status: DC
  Administered 2020-12-09 – 2020-12-12 (×4): 40 mg via SUBCUTANEOUS
  Filled 2020-12-09 (×4): qty 0.4

## 2020-12-09 MED ORDER — CLOPIDOGREL BISULFATE 75 MG PO TABS
75.0000 mg | ORAL_TABLET | Freq: Every day | ORAL | Status: DC
  Administered 2020-12-09 – 2020-12-12 (×4): 75 mg via ORAL
  Filled 2020-12-09 (×4): qty 1

## 2020-12-09 MED ORDER — BUSPIRONE HCL 15 MG PO TABS
30.0000 mg | ORAL_TABLET | Freq: Two times a day (BID) | ORAL | Status: DC
  Administered 2020-12-09 – 2020-12-12 (×6): 30 mg via ORAL
  Filled 2020-12-09 (×7): qty 2
  Filled 2020-12-09: qty 6
  Filled 2020-12-09: qty 2

## 2020-12-09 MED ORDER — LATANOPROST 0.005 % OP SOLN
1.0000 [drp] | Freq: Every day | OPHTHALMIC | Status: DC
  Administered 2020-12-10 – 2020-12-11 (×2): 1 [drp] via OPHTHALMIC
  Filled 2020-12-09 (×2): qty 2.5

## 2020-12-09 MED ORDER — POLYETHYLENE GLYCOL 3350 17 G PO PACK: 17.0000 g | PACK | Freq: Every day | ORAL | Status: DC | PRN

## 2020-12-09 MED ORDER — ONDANSETRON HCL 4 MG/2ML IJ SOLN: 4.0000 mg | Freq: Four times a day (QID) | INTRAMUSCULAR | Status: DC | PRN

## 2020-12-09 MED ORDER — ALPRAZOLAM 0.25 MG PO TABS
0.2500 mg | ORAL_TABLET | Freq: Every evening | ORAL | Status: DC | PRN
  Administered 2020-12-09 – 2020-12-11 (×3): 0.25 mg via ORAL
  Filled 2020-12-09 (×3): qty 1

## 2020-12-09 MED ORDER — POTASSIUM CHLORIDE CRYS ER 20 MEQ PO TBCR
40.0000 meq | EXTENDED_RELEASE_TABLET | Freq: Once | ORAL | Status: AC
  Administered 2020-12-09: 40 meq via ORAL
  Filled 2020-12-09: qty 2

## 2020-12-09 MED ORDER — BRIMONIDINE TARTRATE 0.2 % OP SOLN
1.0000 [drp] | Freq: Two times a day (BID) | OPHTHALMIC | Status: DC
  Administered 2020-12-10 – 2020-12-12 (×4): 1 [drp] via OPHTHALMIC
  Filled 2020-12-09 (×2): qty 5

## 2020-12-09 MED ORDER — METOPROLOL SUCCINATE ER 25 MG PO TB24
12.5000 mg | ORAL_TABLET | Freq: Every day | ORAL | Status: DC
  Administered 2020-12-09 – 2020-12-11 (×3): 12.5 mg via ORAL
  Filled 2020-12-09: qty 0.5
  Filled 2020-12-09: qty 1
  Filled 2020-12-09: qty 0.5
  Filled 2020-12-09: qty 1

## 2020-12-09 MED ORDER — POTASSIUM CHLORIDE CRYS ER 20 MEQ PO TBCR
40.0000 meq | EXTENDED_RELEASE_TABLET | Freq: Every day | ORAL | Status: AC
  Administered 2020-12-10 – 2020-12-12 (×3): 40 meq via ORAL
  Filled 2020-12-09 (×3): qty 2

## 2020-12-09 NOTE — Telephone Encounter (Signed)
[facility] referral entered.   Called pt again today and no answer, looks like pt is in ED.

## 2020-12-09 NOTE — H&P (Addendum)
History and Physical  Todd Whitehead ZOX:[id number] DOB: 03-08-1945 DOA: 12/09/2020  Referring physician: Dr. Cinda Quest, [place]. PCP: [name], MD  Outpatient Specialists: Oncology, PMR. Patient coming from: Home through [place] [facility]  Chief Complaint: Sent from [place] [facility] due to oxygen saturation 70% on room air.  HPI: Todd Whitehead is a 75 y.o. male with medical history significant for COPD, not on oxygen supplementation at baseline, ongoing tobacco use 1 and 1/2 pack/day, coronary artery disease status post CABG in 2016, not followed by cardiology, MGUS, chronic lower back pain followed by PMR who presented to [facility] ED from [place] [facility] due to dyspnea and O2 saturation of 70% on room air.  Patient endorses dyspnea with minimal exertion for the past 2 weeks, gradually worsening.  Associated with bilateral lower extremity edema, no legs pain.  No chest pain.  He continues to smoke about 1 and 1/2 pack/day, although has cut back.  He has a chronic cough with white phlegm.  Denies any fevers.  Upon presentation to the ED, he was placed on 4 L nasal cannula with O2 saturation of 100%.  Chest x-ray concerning for pulmonary edema, he has 1+ pitting edema in lower extremities bilaterally.  His BNP is greater than 1500.  Due to concern for acute CHF, EDP requested admission.  Admitted by hospitalist team.    ED Course: Temperature 98.4.  BP 171/84.  Pulse 106, respiration rate 17, O2 saturation 100% on 4 L  [place].  Lab studies remarkable for serum potassium 3.3, BUN 14, creatinine 1.0, BNP 1527.  Troponin 26, 28.  Hemoglobin 8.2, platelet 142.  Review of Systems: Review of systems as noted in the HPI. All other systems reviewed and are negative.   Past Medical History:  Diagnosis Date   Anxiety    Arthritis    Colon polyp    COPD (chronic obstructive pulmonary disease) ([place])    smoker   Dysrhythmia    Glaucoma    Heart murmur    Hypertension    on  no meds   Low serum vitamin  B12 11/18/2020   Melanoma ([place])    Resected from Left upper arm.    Myocardial infarction [facility]) 2017   Peripheral vascular disease [facility])    Prostate enlargement    Past Surgical History:  Procedure Laterality Date   BACK SURGERY  01/19/98   CARDIAC CATHETERIZATION N/A 08/19/2015   Procedure: Left Heart Cath and Coronary Angiography;  Surgeon: [name], MD;  Location: [place] CV LAB;  Service: Cardiovascular;  Laterality: N/A;   CHOLECYSTECTOMY N/A 08/19/2016   Procedure: LAPAROSCOPIC CHOLECYSTECTOMY WITH INTRAOPERATIVE CHOLANGIOGRAM;  Surgeon: [name], MD;  Location: [place];  Service: General;  Laterality: N/A;   COLON SURGERY  age 41 days old   " bowel was twisted up"   COLONOSCOPY WITH PROPOFOL N/A 03/19/2016   Procedure: COLONOSCOPY WITH PROPOFOL;  Surgeon: [name], MD;  [facility];  Service: Endoscopy;  Laterality: N/A;   CORONARY ARTERY BYPASS GRAFT  2017   ERCP N/A 08/18/2016   Procedure: ENDOSCOPIC RETROGRADE CHOLANGIOPANCREATOGRAPHY (ERCP);  Surgeon: [name], MD;  Location: [place];  Service: Gastroenterology;  Laterality: N/A;   ESOPHAGOGASTRODUODENOSCOPY (EGD) WITH PROPOFOL N/A 09/18/2018   Procedure: ESOPHAGOGASTRODUODENOSCOPY (EGD) WITH PROPOFOL;  Surgeon: [name], DO;  Location: [place] ENDOSCOPY;  Service: General;  Laterality: N/A;   EYE SURGERY Left 03/26/11   LOWER EXTREMITY ANGIOGRAPHY Left 10/03/2017   Procedure: LOWER EXTREMITY ANGIOGRAPHY;  Surgeon: Leotis Pain  S, MD;  Location: [place] CV LAB;  Service: Cardiovascular;  Laterality: Left;   LOWER EXTREMITY ANGIOGRAPHY Right 10/31/2017   Procedure: LOWER EXTREMITY ANGIOGRAPHY;  Surgeon: [name], MD;  Location: [place] CV LAB;  Service: Cardiovascular;  Laterality: Right;   LUMBAR LAMINECTOMY/DECOMPRESSION MICRODISCECTOMY Right 10/02/2012   Procedure: Right Lumbar Five-Sacral One Lumbar laminotomy/Microdiskectomy;  Surgeon: [name], MD;  Location: [place]  NEURO ORS;  Service: Neurosurgery;  Laterality: Right;  Right Lumbar Five-Sacral One Lumbar laminotomy/Microdiskectomy   VISCERAL ANGIOGRAPHY N/A 11/08/2020   Procedure: VISCERAL ANGIOGRAPHY;  Surgeon: Katha Cabal, MD;  Location: [place] CV LAB;  Service: Cardiovascular;  Laterality: N/A;   VISCERAL ANGIOGRAPHY N/A 11/25/2020   Procedure: VISCERAL ANGIOGRAPHY;  Surgeon: Katha Cabal, MD;  Location: [place] CV LAB;  Service: Cardiovascular;  Laterality: N/A;    Social History:  reports that he has been smoking cigarettes. He has a 87.00 pack-year smoking history. He has never used smokeless tobacco. He reports that he does not drink alcohol and does not use drugs.   Allergies  Allergen Reactions   Other Nausea And Vomiting    Cheese,butter,sour cream,cottage cheese   Poison Ivy Extract Rash   Poison Oak Extract Rash    Family History  Problem Relation Age of Onset   Heart attack Father    Aneurysm Brother    Lung cancer Brother       Prior to Admission medications   Medication Sig Start Date End Date Taking? Authorizing Provider  acetaminophen (TYLENOL) 500 MG tablet Take 1 tablet (500 mg total) by mouth every 6 (six) hours as needed. Patient taking differently: Take 500 mg by mouth every 6 (six) hours as needed for moderate pain or headache. 08/20/16  Yes Waldron Session, MD  ALPRAZolam Duanne Moron) 0.5 MG tablet Take 0.25 mg by mouth at bedtime as needed for sleep.   Yes [provider]  aspirin EC 81 MG tablet Take 81 mg by mouth daily. Swallow whole.   Yes [provider]  atorvastatin (LIPITOR) 40 MG tablet Take 40 mg by mouth daily.  05/22/17  Yes [provider]  brimonidine (ALPHAGAN) 0.2 % ophthalmic solution Place 1 drop into both eyes 2 (two) times daily. 02/14/16  Yes [provider]  busPIRone (BUSPAR) 30 MG tablet Take 30 mg by mouth 2 (two) times daily.   Yes [provider]  citalopram (CELEXA) 20 MG tablet  Take 20 mg by mouth daily. 10/28/19  Yes [provider]  clopidogrel (PLAVIX) 75 MG tablet Take 1 tablet (75 mg total) by mouth daily. 11/26/20  Yes Schnier, Dolores Lory, MD  latanoprost (XALATAN) 0.005 % ophthalmic solution Place 1 drop into both eyes at bedtime.  06/14/17  Yes [provider]  metoprolol succinate (TOPROL-XL) 25 MG 24 hr tablet Take 12.5 mg by mouth daily.   Yes [provider]  QUEtiapine (SEROQUEL) 25 MG tablet Take 25 mg by mouth at bedtime. 07/15/19  Yes [provider]  tamsulosin (FLOMAX) 0.4 MG CAPS capsule Take 1 capsule (0.4 mg total) by mouth daily after breakfast. 08/20/16  Yes Waldron Session, MD  traMADol (ULTRAM) 50 MG tablet Take 50 mg by mouth 2 (two) times daily. 09/25/17  Yes [provider]  traZODone (DESYREL) 50 MG tablet Take 50 mg by mouth at bedtime. 06/02/19  Yes [provider]  sildenafil (REVATIO) 20 MG tablet TAKE 2 TO 5 TABLETS AS NEEDED FOR ERECTION Patient not taking: No sig reported  12/21/19   Nori Riis, PA-C    Physical Exam: BP (!) 167/79   Pulse 64   Temp 98.4 F (36.9 C) (Oral)   Resp 16   Ht 5\' 8"  (1.727 m)   Wt 54.9 kg   SpO2 100%   BMI 18.40 kg/m   General: 75 y.o. year-old male well developed well nourished in no acute distress.  Alert and oriented x3. Cardiovascular: Regular rate and rhythm with no rubs or gallops.  No thyromegaly or JVD noted.  2+ pitting edema lower extremities bilaterally.   Respiratory: Diffuse rales and wheezing bilaterally. Good inspiratory effort. Abdomen: Soft nontender nondistended with normal bowel sounds x4 quadrants. Muskuloskeletal: No cyanosis or clubbing.  2+ pitting edema in lower extremities bilaterally.   Neuro: CN II-XII intact, strength, sensation, reflexes Skin: No ulcerative lesions noted or rashes Psychiatry: Judgement and insight appear normal. Mood is appropriate for condition and setting          Labs on Admission:  Basic  Metabolic Panel: Recent Labs  Lab 12/09/20 1103  NA 133*  K 3.3*  CL 101  CO2 27  GLUCOSE 111*  BUN 14  CREATININE 1.20  CALCIUM 8.4*   Liver Function Tests: Recent Labs  Lab 12/09/20 1103  AST 30  ALT 21  ALKPHOS 97  BILITOT 0.6  PROT 6.3*  ALBUMIN 3.0*   No results for input(s): LIPASE, AMYLASE in the last 168 hours. No results for input(s): AMMONIA in the last 168 hours. CBC: Recent Labs  Lab 12/09/20 1103  WBC 5.2  NEUTROABS 3.4  HGB 8.2*  HCT 24.3*  MCV 93.1  PLT 142*   Cardiac Enzymes: No results for input(s): CKTOTAL, CKMB, CKMBINDEX, TROPONINI in the last 168 hours.  BNP (last 3 results) Recent Labs    12/09/20 1100  BNP 1,527.4*    ProBNP (last 3 results) No results for input(s): PROBNP in the last 8760 hours.  CBG: No results for input(s): GLUCAP in the last 168 hours.  Radiological Exams on Admission: DG Chest Portable 1 View  Result Date: 12/09/2020 CLINICAL DATA:  Shortness of breath. EXAM: PORTABLE CHEST 1 VIEW COMPARISON:  09/26/2012 FINDINGS: Signs of previous median sternotomy and CABG procedure. The heart size and mediastinal contours are within normal limits. Increased interstitial markings compatible with pulmonary vascular congestion. No frank edema or pleural effusion. No airspace consolidation. The visualized skeletal structures are unremarkable. IMPRESSION: Increased interstitial markings compatible with pulmonary vascular congestion. Electronically Signed   By: Kerby Moors M.D.   On: 12/09/2020 11:13    EKG: I independently viewed the EKG done and my findings are as followed: Sinus rhythm rate of 66, RBBB, nonspecific ST-T changes.  QTc 482  Assessment/Plan Present on Admission: **None**  Active Problems:   CHF (congestive heart failure) (HCC)  Acute CHF, unspecified, no recent 2D echo to compare. Presented with dyspnea x2 weeks, gradually worsening, bilateral lower extremity edema, elevated BNP greater than 1500, chest  x-ray with evidence of pulmonary edema, personally reviewed. Denies chest pain. Received diuresis in the ED, continue. Obtain 2D echo Start strict I's and O's and daily weight Closely monitor urine output, electrolytes, blood pressure while on IV diuretics. Cardiology consult.  Acute hypoxic respiratory failure likely secondary to pulmonary edema, seen on chest x-ray. Personally reviewed chest x-ray which showed increasing pulmonary vascularity suggestive of pulmonary edema. He is not on oxygen supplementation at baseline O2 saturation 70% on room air at the [place] [facility]. He is currently on 4 L  nasal cannula, continue diuresing, wean off as tolerated. Resume home bronchodilators. Mobilize as tolerated Incentive spirometer, flutter valve.  Elevated troponin, suspect demand ischemia in the setting of hypoxia Denies any anginal symptoms Troponin 26, repeated 28. Follow 2D echo Closely monitor on telemetry Continue home aspirin, statin, beta-blocker. Rest of management per cardiology.  Chronic normocytic anemia, unclear etiology He has a history of polyps seen on colonoscopy in 2018 He had an upper endoscopy done in 2020, it showed gastric mucosal atrophy and small hiatal hernia Presents with hemoglobin of 8.2 Closely monitor H&H Unclear if related to MGUS He follows with medical oncology  Coronary artery disease status post CABG Stated his last cardiology outpatient visit was in 2016 Resume home Plavix, and Lipitor 40 mg daily Current denies any anginal symptoms. Follow 2D echo Closely monitor on telemetry Rest of management per cardiology.  Essential hypertension BP is not at goal, elevated Resume home Toprol-XL  Hypokalemia Repleted orally. Continue daily replacement while on IV diuretics. Obtain magnesium level in the morning.  Hyperlipidemia Resume home statin  BPH No evidence of acute urinary retention Resume home Flomax Monitor urine output  Chronic  anxiety/depression Resume home Celexa, BuSpar, Seroquel, trazodone  Chronic lower back pain, follows with PMR Resume home tramadol  MGUS Follows with medical oncology outpatient.  Physical debility PT OT assessment Fall precautions.    DVT prophylaxis: Subcu Lovenox daily  Code Status: Full code  Family Communication: None at bedside  Disposition Plan: Admitted to telemetry cardiac unit.  Consults called: Cardiology consulted via epic.  Admission status: Inpatient status.   Status is: Inpatient  Patient will require at least 2 midnights for further evaluation and treatment of present condition.       Kayleen Memos MD [facility] Pager [phone]  If 7PM-7AM, please contact night-coverage [email or web address] Password [email or web address]  12/09/2020, 3:21 PM

## 2020-12-09 NOTE — ED Notes (Signed)
Pt c/o SOB. Denies chest or lung pain. Normally breathes "comfortably" at home and is not on O2 at home and only reports dyspnea with exertion. Pt reports coughing and runny nose but no more than he normally has. Denies sore throat. Pt reports headache this week but denies headache/pain currently. Pt unaware of being around anyone sick lately. Currently on 4L/min O2 nasal cannula and oxygen saturation in high 90's.

## 2020-12-09 NOTE — ED Notes (Signed)
Pt finished second nebulizer treatment. Denies improvement or change in breathing. Does not report any chest pain at this time.

## 2020-12-09 NOTE — ED Triage Notes (Signed)
Patient to ER for c/o shortness of breath. Patient was brought from [place] clinic for O2 sat of 70% on RA. Patient is a smoker, denies ever wearing O2 at home. Denies any recent fever. Denies cough other than typical cough he has always had with smoking. Patient able to speak in complete sentences. Arrives to ER with 2L O2 in place. Increased to 4L O2 upon arrival with O2 sat of 84%-88%.

## 2020-12-09 NOTE — ED Notes (Signed)
Pt up to bedside bathroom

## 2020-12-09 NOTE — ED Notes (Signed)
Request made for transport to the floor ?

## 2020-12-09 NOTE — ED Provider Notes (Signed)
[facility] Emergency Department Provider Note   ____________________________________________   Event Date/Time   First MD Initiated Contact with Patient 12/09/20 1049     (approximate)  I have reviewed the triage vital signs and the nursing notes.   HISTORY  Chief Complaint Shortness of Breath    HPI Todd Whitehead is a 75 y.o. male who was sent over for [facility] clinic for shortness of breath.  O2 sats ranged from 70-88 on room air.  He is not normally on oxygen.  Patient reports he has had slow progression of shortness of breath for many months.  He is coughing up some thick white phlegm.  He has some swelling in his ankles that is a little worse than usual.  He has no fever or chest pain or tightness.  There is really no acute change in the last few days.         Past Medical History:  Diagnosis Date   Anxiety    Arthritis    Colon polyp    COPD (chronic obstructive pulmonary disease) ([place])    smoker   Dysrhythmia    Glaucoma    Heart murmur    Hypertension    on  no meds   Low serum vitamin B12 11/18/2020   Melanoma ([place])    Resected from Left upper arm.    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   Hyperlipidemia 02/07/2018   Epistaxis 11/08/2017   PVD (peripheral vascular disease) with claudication ([place]) 09/28/2017   Hypertension 06/25/2017   Abdominal pain 06/25/2017   Aortic atherosclerosis ([place]) 06/25/2017   Type 2 diabetes mellitus with stage 3 chronic kidney disease, without long-term current use of insulin ([place]) 12/24/2016   Bile duct calculus with acute cholecystitis 08/17/2016   Acute cholecystitis    Personal history of tobacco use, presenting hazards to health 07/08/2016   Health care maintenance 12/19/2015    Elevated LFTs 10/10/2015   Edema of right lower extremity 10/10/2015   COPD (chronic obstructive pulmonary disease) with chronic bronchitis ([place]) 10/04/2015   Anxiety 09/11/2015   Carotid artery disease ([place]) 09/09/2015   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   CAD (coronary artery disease) 08/22/2015   Benign non-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 03/21/2015   Chronic low back pain 03/21/2015   Depression, major, in remission ([place]) 03/21/2015    Past Surgical History:  Procedure Laterality Date   BACK SURGERY  01/19/98   CARDIAC CATHETERIZATION N/A 08/19/2015   Procedure: Left Heart Cath and Coronary Angiography;  Surgeon: [name], MD;  Location: [place] CV LAB;  Service: Cardiovascular;  Laterality: N/A;   CHOLECYSTECTOMY N/A 08/19/2016   Procedure: LAPAROSCOPIC CHOLECYSTECTOMY WITH INTRAOPERATIVE CHOLANGIOGRAM;  Surgeon: [name], MD;  Location: [place];  Service: General;  Laterality: N/A;   COLON SURGERY  age 21 days old   " bowel was twisted up"   COLONOSCOPY WITH PROPOFOL N/A 03/19/2016   Procedure: COLONOSCOPY WITH PROPOFOL;  Surgeon: [name], MD;  Location: [facility] ENDOSCOPY;  Service: Endoscopy;  Laterality: N/A;   CORONARY ARTERY BYPASS GRAFT  2017   ERCP N/A 08/18/2016   Procedure: ENDOSCOPIC RETROGRADE CHOLANGIOPANCREATOGRAPHY (ERCP);  Surgeon: [name], MD;  Location: [place];  Service: Gastroenterology;  Laterality: N/A;   ESOPHAGOGASTRODUODENOSCOPY (EGD) WITH PROPOFOL N/A 09/18/2018   Procedure: ESOPHAGOGASTRODUODENOSCOPY (EGD) WITH PROPOFOL;  Surgeon: [name], DO;  Location: [place];  Service: General;  Laterality: N/A;   EYE SURGERY Left 03/26/11   LOWER EXTREMITY ANGIOGRAPHY Left 10/03/2017   Procedure: LOWER EXTREMITY ANGIOGRAPHY;  Surgeon: [name], MD;  Location: [place] CV LAB;  Service: Cardiovascular;  Laterality: Left;   LOWER EXTREMITY ANGIOGRAPHY Right 10/31/2017   Procedure: LOWER EXTREMITY  ANGIOGRAPHY;  Surgeon: [name], MD;  Location: [place] CV LAB;  Service: Cardiovascular;  Laterality: Right;   LUMBAR LAMINECTOMY/DECOMPRESSION MICRODISCECTOMY Right 10/02/2012   Procedure: Right Lumbar Five-Sacral One Lumbar laminotomy/Microdiskectomy;  Surgeon: [name], MD;  Location: [place] NEURO ORS;  Service: Neurosurgery;  Laterality: Right;  Right Lumbar Five-Sacral One Lumbar laminotomy/Microdiskectomy   VISCERAL ANGIOGRAPHY N/A 11/08/2020   Procedure: VISCERAL ANGIOGRAPHY;  Surgeon: Katha Cabal, MD;  Location: [place] CV LAB;  Service: Cardiovascular;  Laterality: N/A;   VISCERAL ANGIOGRAPHY N/A 11/25/2020   Procedure: VISCERAL ANGIOGRAPHY;  Surgeon: Katha Cabal, MD;  Location: [place] CV LAB;  Service: Cardiovascular;  Laterality: N/A;    Prior to Admission medications   Medication Sig Start Date End Date Taking? Authorizing Provider  acetaminophen (TYLENOL) 500 MG tablet Take 1 tablet (500 mg total) by mouth every 6 (six) hours as needed. Patient taking differently: Take 500 mg by mouth every 6 (six) hours as needed for moderate pain or headache. 08/20/16   Waldron Session, MD  ALPRAZolam Duanne Moron) 0.5 MG tablet Take 0.25 mg by mouth at bedtime as needed for sleep.    [provider]  aspirin EC 81 MG tablet Take 81 mg by mouth daily. Swallow whole.    [provider]  atorvastatin (LIPITOR) 40 MG tablet Take 40 mg by mouth daily.  05/22/17   [provider]  brimonidine (ALPHAGAN) 0.2 % ophthalmic solution Place 1 drop into both eyes 2 (two) times daily. 02/14/16   [provider]  busPIRone (BUSPAR) 30 MG tablet Take 30 mg by mouth 2 (two) times daily.    [provider]  citalopram (CELEXA) 20 MG tablet Take 20 mg by mouth daily. 10/28/19   [provider]  clopidogrel (PLAVIX) 75 MG tablet Take 1 tablet (75 mg total) by mouth daily. 11/26/20   Schnier, Dolores Lory, MD  latanoprost (XALATAN) 0.005 %  ophthalmic solution Place 1 drop into both eyes at bedtime.  06/14/17   [provider]  metoprolol succinate (TOPROL-XL) 25 MG 24 hr tablet Take 12.5 mg by mouth daily.    [provider]  QUEtiapine (SEROQUEL) 25 MG tablet Take 25 mg by mouth at bedtime. 07/15/19   [provider]  sildenafil (REVATIO) 20 MG tablet TAKE 2 TO 5 TABLETS AS NEEDED FOR ERECTION Patient not taking: No sig reported 12/21/19   Zara Council A, PA-C  tamsulosin (FLOMAX) 0.4 MG CAPS capsule Take 1 capsule (0.4 mg total) by mouth daily after breakfast. 08/20/16   Waldron Session, MD  traMADol (ULTRAM) 50 MG tablet Take 50 mg by mouth 2 (two) times daily. 09/25/17   [provider]  traZODone (DESYREL) 50 MG tablet Take 50 mg by mouth at bedtime. 06/02/19   [provider]    Allergies Other, Poison ivy extract, and Poison oak extract  Family History  Problem Relation Age of Onset   Heart attack Father    Aneurysm Brother    Lung cancer Brother  Social History Social History   Tobacco Use   Smoking status: Every Day    Packs/day: 1.50    Years: 58.00    Pack years: 87.00    Types: Cigarettes   Smokeless tobacco: Never  Vaping Use   Vaping Use: Former  Substance Use Topics   Alcohol use: No   Drug use: No    Review of Systems  Constitutional: No fever/chills Eyes: No visual changes. ENT: No sore throat. Cardiovascular: Denies chest pain. Respiratory:shortness of breath. Gastrointestinal: No abdominal pain.  No nausea, no vomiting.  No diarrhea.  No constipation. Genitourinary: Negative for dysuria. Musculoskeletal: Negative for back pain. Skin: Negative for rash. Neurological: Negative for headaches, focal weakness   ____________________________________________   PHYSICAL EXAM:  VITAL SIGNS: ED Triage Vitals  Enc Vitals Group     BP 12/09/20 1041 (!) 148/79     Pulse Rate 12/09/20 1041 62     Resp 12/09/20 1041 (!) 28     Temp 12/09/20 1041  98.4 F (36.9 C)     Temp Source 12/09/20 1041 Oral     SpO2 12/09/20 1041 (!) 88 %     Weight 12/09/20 1042 121 lb (54.9 kg)     Height 12/09/20 1042 5\' 8"  (1.727 m)     Head Circumference --      Peak Flow --      Pain Score 12/09/20 1042 0     Pain Loc --      Pain Edu? --      Excl. in [place]? --     Constitutional: Alert and oriented.  Appears chronically ill and short of breath Eyes: Conjunctivae are normal.  Head: Atraumatic. Nose: No congestion/rhinnorhea. Mouth/Throat: Mucous membranes are moist.  Oropharynx non-erythematous. Neck: No stridor.  Cardiovascular: Normal rate, regular rhythm. Grossly normal heart sounds.  Good peripheral circulation. Respiratory: Increased respiratory effort.  Some retractions. Lungs decreased breath sounds throughout Gastrointestinal: Soft and nontender. No distention. No abdominal bruits.  Musculoskeletal: No lower extremity tenderness bilateral edema in the ankles and lower legs 1+ Neurologic:  Normal speech and language. No gross focal neurologic deficits are appreciated.  Skin skin: Changes consistent with old age nothing else  ____________________________________________   LABS (all labs ordered are listed, but only abnormal results are displayed)  Labs Reviewed  COMPREHENSIVE METABOLIC PANEL - Abnormal; Notable for the following components:      Result Value   Sodium 133 (*)    Potassium 3.3 (*)    Glucose, Bld 111 (*)    Calcium 8.4 (*)    Total Protein 6.3 (*)    Albumin 3.0 (*)    All other components within normal limits  BRAIN NATRIURETIC PEPTIDE - Abnormal; Notable for the following components:   B Natriuretic Peptide 1,527.4 (*)    All other components within normal limits  CBC WITH DIFFERENTIAL/PLATELET - Abnormal; Notable for the following components:   RBC 2.61 (*)    Hemoglobin 8.2 (*)    HCT 24.3 (*)    RDW 17.2 (*)    Platelets 142 (*)    All other components within normal limits  TROPONIN I (HIGH SENSITIVITY)  - Abnormal; Notable for the following components:   Troponin I (High Sensitivity) 26 (*)    All other components within normal limits  TROPONIN I (HIGH SENSITIVITY) - Abnormal; Notable for the following components:   Troponin I (High Sensitivity) 28 (*)    All other components within normal limits  RESP PANEL BY RT-PCR (  FLU A&B, COVID) ARPGX2  LACTIC ACID, PLASMA  LACTIC ACID, PLASMA  TYPE AND SCREEN   ____________________________________________  EKG  EKG read interpreted by me shows normal sinus rhythm rate of 66 rightward axis no acute ST-T wave changes.  ST segment downsloping inferiorly and notched T waves in V3 were present in 2018. ____________________________________________  RADIOLOGY Gertha Calkin, personally viewed and evaluated these images (plain radiographs) as part of my medical decision making, as well as reviewing the written report by the radiologist.  ED MD interpretation:    Official radiology report(s): DG Chest Portable 1 View  Result Date: 12/09/2020 CLINICAL DATA:  Shortness of breath. EXAM: PORTABLE CHEST 1 VIEW COMPARISON:  09/26/2012 FINDINGS: Signs of previous median sternotomy and CABG procedure. The heart size and mediastinal contours are within normal limits. Increased interstitial markings compatible with pulmonary vascular congestion. No frank edema or pleural effusion. No airspace consolidation. The visualized skeletal structures are unremarkable. IMPRESSION: Increased interstitial markings compatible with pulmonary vascular congestion. Electronically Signed   By: Kerby Moors M.D.   On: 12/09/2020 11:13    ____________________________________________   PROCEDURES  Procedure(s) performed (including Critical Care): Critical care time 25 minutes.  This includes reviewing the patient's old records speaking with the hospitalist checking on the patient twice to see how his lungs are doing ordering his medicines and reviewing his  studies.  Procedures   ____________________________________________   INITIAL IMPRESSION / ASSESSMENT AND PLAN / ED COURSE  Patient's labs and x-Todd and presentation are consistent with gradual onset of CHF.  I do not see any old records mentioning CHF before.  While this process has been ongoing for months it appears to be a new diagnosis of CHF.  Patient's troponin is slightly elevated but likely due to strain.  Repeat troponin is still pending.  There is no sign or history consistent with PEs.  There is no sign of pneumonia.  White count is normal.  Looks like the only problem really is congestive heart failure and possibly an element of COPD.             ____________________________________________   FINAL CLINICAL IMPRESSION(S) / ED DIAGNOSES  Final diagnoses:  Congestive heart failure, unspecified HF chronicity, unspecified heart failure type (HCC)  Symptomatic anemia  Hypoxia  SOB (shortness of breath)     ED Discharge Orders     None        Note:  This document was prepared using Dragon voice recognition software and may include unintentional dictation errors.    Nena Polio, MD 12/09/20 1325

## 2020-12-10 ENCOUNTER — Inpatient Hospital Stay (HOSPITAL_COMMUNITY)
Admit: 2020-12-10 | Discharge: 2020-12-10 | Disposition: A | Discharge status: Home / Self Care | Payer: Medicare Other | Attending: Internal Medicine | Admitting: Internal Medicine

## 2020-12-10 DIAGNOSIS — D649 Anemia, unspecified: Secondary | ICD-10-CM

## 2020-12-10 DIAGNOSIS — I5031 Acute diastolic (congestive) heart failure: Secondary | ICD-10-CM | POA: Diagnosis not present

## 2020-12-10 DIAGNOSIS — R0902 Hypoxemia: Secondary | ICD-10-CM

## 2020-12-10 LAB — ECHOCARDIOGRAM COMPLETE
AR max vel: 2.85 cm2
AV Peak grad: 3.3 mmHg
Ao pk vel: 0.9 m/s
Area-P 1/2: 4.63 cm2
Calc EF: 41.2 %
Height: 68 in
S' Lateral: 3.7 cm
Single Plane A2C EF: 35.7 %
Single Plane A4C EF: 41.4 %
Weight: 1837.75 oz

## 2020-12-10 LAB — CBC
HCT: 23.5 % — ABNORMAL LOW (ref 39.0–52.0)
Hemoglobin: 7.9 g/dL — ABNORMAL LOW (ref 13.0–17.0)
MCH: 31.3 pg (ref 26.0–34.0)
MCHC: 33.6 g/dL (ref 30.0–36.0)
MCV: 93.3 fL (ref 80.0–100.0)
Platelets: 147 10*3/uL — ABNORMAL LOW (ref 150–400)
RBC: 2.52 MIL/uL — ABNORMAL LOW (ref 4.22–5.81)
RDW: 16.9 % — ABNORMAL HIGH (ref 11.5–15.5)
WBC: 5.7 10*3/uL (ref 4.0–10.5)
nRBC: 0 % (ref 0.0–0.2)

## 2020-12-10 LAB — BASIC METABOLIC PANEL
Anion gap: 7 (ref 5–15)
BUN: 15 mg/dL (ref 8–23)
CO2: 30 mmol/L (ref 22–32)
Calcium: 8.3 mg/dL — ABNORMAL LOW (ref 8.9–10.3)
Chloride: 100 mmol/L (ref 98–111)
Creatinine, Ser: 1.03 mg/dL (ref 0.61–1.24)
GFR, Estimated: >60 mL/min (ref >60)
Glucose, Bld: 97 mg/dL (ref 70–99)
Potassium: 3.4 mmol/L — ABNORMAL LOW (ref 3.5–5.1)
Sodium: 137 mmol/L (ref 135–145)

## 2020-12-10 LAB — PHOSPHORUS: Phosphorus: 4.1 mg/dL (ref 2.5–4.6)

## 2020-12-10 LAB — MAGNESIUM: Magnesium: 2.1 mg/dL (ref 1.7–2.4)

## 2020-12-10 MED ORDER — NICOTINE POLACRILEX 2 MG MT GUM
2.0000 mg | CHEWING_GUM | OROMUCOSAL | Status: DC | PRN
  Administered 2020-12-11: 2 mg via ORAL
  Filled 2020-12-10: qty 1

## 2020-12-10 MED ORDER — FUROSEMIDE 10 MG/ML IJ SOLN
40.0000 mg | Freq: Four times a day (QID) | INTRAMUSCULAR | Status: AC
  Administered 2020-12-10 – 2020-12-11 (×4): 40 mg via INTRAVENOUS
  Filled 2020-12-10 (×4): qty 4

## 2020-12-10 NOTE — Progress Notes (Signed)
PROGRESS NOTE  Todd Whitehead    DOB: Sep 02, 1945, 75 y.o.  ZOX:[id number]  PCP: [name], MD   Code Status: Full Code   DOA: 12/09/2020   LOS: 1  Brief Narrative of Current Hospitalization  Todd Whitehead is a 75 y.o. male with a PMH significant for COPD, tobacco use, CAD s/p CABG in 2016, MGUS, chronic low back pain. They presented from outpatient clinic to the ED on 12/09/2020 with dyspnea and hypoxia to 70% on room air with no baseline oxygen requirement x 2 weeks gradually worsening associated with lower extremity swelling and cough. In the ED, it was found that they had pulmonary edema on chest x-ray with lower extremity pitting edema and BNP greater than 1500. They were treated with diuresis, supplemental oxygen.  Patient was admitted to medicine service for further workup and management of congestive heart failure as outlined in detail below.  12/10/20 -stable  Assessment & Plan  Active Problems:   CHF (congestive heart failure) (HCC)  Acute hypoxic respiratory failure 2/2 volume overload-patient remained stable on 4 L nasal cannula.  No home oxygen requirement -Wean to room air as tolerated -PT/OT  Suspected acute on chronic heart failure -Cardiology consulted by admitting team, appreciate recs -Echo pending -IV diuretics -Strict I's/O -Daily weights  CAD s/p CABG  HTN  HLD -Continue statin, clopidogrel, metoprolol  COPD-no home medications -DuoNebs as needed  Tobacco use -cessation counseling  Hypokalemia- -40 mEq potassium  BPH -Continue home Flomax  Chronic anxiety/depression -Continue home Xanax as needed -Continue home BuSpar and citalopram, Seroquel, trazodone  Chronic low back pain -Continue home tramadol -Continue home MiraLAX  MGUS - f/u OP  DVT prophylaxis: enoxaparin (LOVENOX) injection 40 mg Start: 12/09/20 1530   Diet:  Diet Orders (From admission, onward)     Start     Ordered   12/09/20 1329  Diet Heart Room service  appropriate? Yes; Fluid consistency: Thin  Diet effective now       Question Answer Comment  Room service appropriate? Yes   Fluid consistency: Thin      12/09/20 1328            Subjective 12/10/20    Pt reports feeling stable today. He has several questions about his care and condition which were all answered. He had no further concerns at this time. States that his LE swelling has improved.   Disposition Plan & Communication  Patient status: Inpatient  Admitted From: Home Disposition: Home Anticipated discharge date: 11/14  Family Communication: none  Consults, Procedures, Significant Events  Consultants:  cardiology  Procedures/significant events:  Echo  Antimicrobials:  Anti-infectives (From admission, onward)    None       Objective   Vitals:   12/09/20 1939 12/09/20 2341 12/10/20 0454 12/10/20 0600  BP: (!) 154/89 (!) 161/92 (!) 159/77   Pulse: 94 74 67   Resp: (!) 24 (!) 24 18   Temp:  98.6 F (37 C) 97.7 F (36.5 C)   TempSrc:  Oral Oral   SpO2: 97% 100% 100%   Weight:  52.1 kg  52.1 kg  Height:  5\' 8"  (1.727 m)     No intake or output data in the 24 hours ending 12/10/20 0735 Filed Weights   12/09/20 1042 12/09/20 2341 12/10/20 0600  Weight: 54.9 kg 52.1 kg 52.1 kg    Patient BMI: Body mass index is 17.46 kg/m.   Physical Exam: General: awake, alert, NAD, frail HEENT: atraumatic, clear conjunctiva,  anicteric sclera, moist mucus membranes, hearing grossly normal Respiratory: normal respiratory effort. Cardiovascular: normal S1/S2,  RRR, no JVD, murmurs, rubs, gallops, quick capillary refill  Gastrointestinal: soft, NT, ND, no HSM felt Nervous: A&O x3. no gross focal neurologic deficits, normal speech Extremities: moves all equally, trace edema, normal tone Skin: dry, intact, normal temperature, normal color, No rashes, lesions or ulcers Psychiatry: normal mood, congruent affect  Labs   I have personally reviewed following labs and imaging  studies Admission on 12/09/2020  Component Date Value Ref Range Status   Sodium 12/09/2020 133 (A)  135 - 145 mmol/L Final   Potassium 12/09/2020 3.3 (A)  3.5 - 5.1 mmol/L Final   Chloride 12/09/2020 101  98 - 111 mmol/L Final   CO2 12/09/2020 27  22 - 32 mmol/L Final   Glucose, Bld 12/09/2020 111 (A)  70 - 99 mg/dL Final   BUN 12/09/2020 14  8 - 23 mg/dL Final   Creatinine, Ser 12/09/2020 1.20  0.61 - 1.24 mg/dL Final   Calcium 12/09/2020 8.4 (A)  8.9 - 10.3 mg/dL Final   Total Protein 12/09/2020 6.3 (A)  6.5 - 8.1 g/dL Final   Albumin 12/09/2020 3.0 (A)  3.5 - 5.0 g/dL Final   AST 12/09/2020 30  15 - 41 U/L Final   ALT 12/09/2020 21  0 - 44 U/L Final   Alkaline Phosphatase 12/09/2020 97  38 - 126 U/L Final   Total Bilirubin 12/09/2020 0.6  0.3 - 1.2 mg/dL Final   GFR, Estimated 12/09/2020 >60  >60 mL/min Final   Anion gap 12/09/2020 5  5 - 15 Final   B Natriuretic Peptide 12/09/2020 1,527.4 (A)  0.0 - 100.0 pg/mL Final   Troponin I (High Sensitivity) 12/09/2020 26 (A)  <18 ng/L Final   Lactic Acid, Venous 12/09/2020 0.8  0.5 - 1.9 mmol/L Final   WBC 12/09/2020 5.2  4.0 - 10.5 K/uL Final   RBC 12/09/2020 2.61 (A)  4.22 - 5.81 MIL/uL Final   Hemoglobin 12/09/2020 8.2 (A)  13.0 - 17.0 g/dL Final   HCT 12/09/2020 24.3 (A)  39.0 - 52.0 % Final   MCV 12/09/2020 93.1  80.0 - 100.0 fL Final   MCH 12/09/2020 31.4  26.0 - 34.0 pg Final   MCHC 12/09/2020 33.7  30.0 - 36.0 g/dL Final   RDW 12/09/2020 17.2 (A)  11.5 - 15.5 % Final   Platelets 12/09/2020 142 (A)  150 - 400 K/uL Final   nRBC 12/09/2020 0.0  0.0 - 0.2 % Final   Neutrophils Relative % 12/09/2020 64  % Final   Neutro Abs 12/09/2020 3.4  1.7 - 7.7 K/uL Final   Lymphocytes Relative 12/09/2020 26  % Final   Lymphs Abs 12/09/2020 1.3  0.7 - 4.0 K/uL Final   Monocytes Relative 12/09/2020 7  % Final   Monocytes Absolute 12/09/2020 0.4  0.1 - 1.0 K/uL Final   Eosinophils Relative 12/09/2020 1  % Final   Eosinophils Absolute  12/09/2020 0.1  0.0 - 0.5 K/uL Final   Basophils Relative 12/09/2020 1  % Final   Basophils Absolute 12/09/2020 0.0  0.0 - 0.1 K/uL Final   Immature Granulocytes 12/09/2020 1  % Final   Abs Immature Granulocytes 12/09/2020 0.04  0.00 - 0.07 K/uL Final   ABO/RH(D) 12/09/2020 O POS   Final   Antibody Screen 12/09/2020 NEG   Final   Sample Expiration 12/09/2020    Final  Value:12/12/2020,2359 Performed at [facility], [address] [place],  [address]    Troponin I (High Sensitivity) 12/09/2020 28 (A)  <18 ng/L Final   SARS Coronavirus 2 by RT PCR 12/09/2020 NEGATIVE  NEGATIVE Final   Influenza A by PCR 12/09/2020 NEGATIVE  NEGATIVE Final   Influenza B by PCR 12/09/2020 NEGATIVE  NEGATIVE Final   Sodium 12/10/2020 137  135 - 145 mmol/L Final   Potassium 12/10/2020 3.4 (A)  3.5 - 5.1 mmol/L Final   Chloride 12/10/2020 100  98 - 111 mmol/L Final   CO2 12/10/2020 30  22 - 32 mmol/L Final   Glucose, Bld 12/10/2020 97  70 - 99 mg/dL Final   BUN 12/10/2020 15  8 - 23 mg/dL Final   Creatinine, Ser 12/10/2020 1.03  0.61 - 1.24 mg/dL Final   Calcium 12/10/2020 8.3 (A)  8.9 - 10.3 mg/dL Final   GFR, Estimated 12/10/2020 >60  >60 mL/min Final   Anion gap 12/10/2020 7  5 - 15 Final   WBC 12/10/2020 5.7  4.0 - 10.5 K/uL Final   RBC 12/10/2020 2.52 (A)  4.22 - 5.81 MIL/uL Final   Hemoglobin 12/10/2020 7.9 (A)  13.0 - 17.0 g/dL Final   HCT 12/10/2020 23.5 (A)  39.0 - 52.0 % Final   MCV 12/10/2020 93.3  80.0 - 100.0 fL Final   MCH 12/10/2020 31.3  26.0 - 34.0 pg Final   MCHC 12/10/2020 33.6  30.0 - 36.0 g/dL Final   RDW 12/10/2020 16.9 (A)  11.5 - 15.5 % Final   Platelets 12/10/2020 147 (A)  150 - 400 K/uL Final   nRBC 12/10/2020 0.0  0.0 - 0.2 % Final   Magnesium 12/10/2020 2.1  1.7 - 2.4 mg/dL Final   Phosphorus 12/10/2020 4.1  2.5 - 4.6 mg/dL Final    Imaging Studies  DG Chest Portable 1 View  Result Date: 12/09/2020 CLINICAL DATA:  Shortness of breath.  EXAM: PORTABLE CHEST 1 VIEW COMPARISON:  09/26/2012 FINDINGS: Signs of previous median sternotomy and CABG procedure. The heart size and mediastinal contours are within normal limits. Increased interstitial markings compatible with pulmonary vascular congestion. No frank edema or pleural effusion. No airspace consolidation. The visualized skeletal structures are unremarkable. IMPRESSION: Increased interstitial markings compatible with pulmonary vascular congestion. Electronically Signed   By: Kerby Moors M.D.   On: 12/09/2020 11:13   Medications   Scheduled Meds:  atorvastatin  40 mg Oral Daily   brimonidine  1 drop Both Eyes BID   busPIRone  30 mg Oral BID   citalopram  20 mg Oral Daily   clopidogrel  75 mg Oral Daily   enoxaparin (LOVENOX) injection  40 mg Subcutaneous Q24H   latanoprost  1 drop Both Eyes QHS   metoprolol succinate  12.5 mg Oral Daily   potassium chloride  40 mEq Oral Daily   QUEtiapine  25 mg Oral QHS   tamsulosin  0.4 mg Oral QPC breakfast   traMADol  50 mg Oral BID   traZODone  50 mg Oral QHS   No recently discontinued medications to reconcile  LOS: 1 day   Time spent: >66min  Natara Monfort L Omarie Parcell, DO [facility] 12/10/2020, 7:35 AM   Please refer to amion to contact the [facility] Attending or Consulting provider for this pt  [email or web address] Available by Epic secure chat 7AM-7PM. If 7PM-7AM, please contact night-coverage

## 2020-12-10 NOTE — Plan of Care (Signed)
  Problem: Education: Goal: Ability to verbalize understanding of medication therapies will improve Outcome: Progressing   Problem: Cardiac: Goal: Ability to achieve and maintain adequate cardiopulmonary perfusion will improve Outcome: Progressing   Problem: Education: Goal: Knowledge of disease or condition will improve Outcome: Progressing   Problem: Activity: Goal: Ability to tolerate increased activity will improve Outcome: Not Progressing - pt unable to stand without increased work of breathing to the point of dizziness.  PT consult placed and 4L [place] continuous.

## 2020-12-10 NOTE — Evaluation (Signed)
Occupational Therapy Evaluation Patient Details Name: Todd Whitehead MRN: [id number] DOB: [birth year] Today's Date: 12/10/2020   History of Present Illness 75 yo male with onset of SOB and LE edema with productive cough was admitted 11/11 for care.  Has CHF exacerbation, acute respiratory failure, low O2 sats with mobility even on 4L O2 and general weakness.  Has been a moderately heavy smoker, with HA's here.  PMHx:  tobacco use, COPD, CAD, CABG, MGUS, LBP,   Clinical Impression   Pt seen for OT evaluation this date. Prior to admission, pt was independent in all ADLs and functional mobility, living in a 1-story home alone. Pt's niece was occasionally assisting with IADLs. Pt does not use supplemental O2 at baseline. Pt currently presents with decreased activity tolerance and strength. Due to these functional impairments, pt requires MIN GUARD for bed mobility, SUPERVISION for seated LB dressing, SUPERVISION for functional mobility of short household distances, and SUPERVISION for toilet transfers. Of note, while on 4L of supplemental O2, SpO2 desat 89% following bed mobility (able to increase to 92% following 1-min of PLB). SpO2 93% following walk to/from toilet and standing hand hygiene. This date, pt engaged in x1 standing grooming task before requiring seated rest break. Pt would benefit from additional skilled OT services to maximize return to PLOF and minimize risk of future falls, injury, caregiver burden, and readmission. Upon discharge, recommend [place] [facility] services.         Recommendations for follow up therapy are one component of a multi-disciplinary discharge planning process, led by the attending physician.  Recommendations may be updated based on patient status, additional functional criteria and insurance authorization.   Follow Up Recommendations  Home health OT    Assistance Recommended at Discharge Frequent or constant Supervision/Assistance  Functional Status Assessment  Patient has  had a recent decline in their functional status and demonstrates the ability to make significant improvements in function in a reasonable and predictable amount of time.  Equipment Recommendations  BSC/3in1;Tub/shower bench       Precautions / Restrictions Precautions Precautions: Fall Precaution Comments: monitor O2 sats and HR with all mobility Restrictions Weight Bearing Restrictions: No      Mobility Bed Mobility Overal bed mobility: Needs Assistance Bed Mobility: Supine to Sit     Supine to sit: Min guard     General bed mobility comments: MIN GUARD d/t fatigue with minimal activity    Transfers Overall transfer level: Needs assistance Equipment used: None Transfers: Sit to/from Stand Sit to Stand: Supervision                  Balance Overall balance assessment: Needs assistance Sitting-balance support: No upper extremity supported;Feet supported Sitting balance-Leahy Scale: Fair Sitting balance - Comments: SUPERVISION for static sitting at EOB. Pt with x1 occassion of posterior lean immediately following bed mobility Postural control: Posterior lean Standing balance support: No upper extremity supported;During functional activity Standing balance-Leahy Scale: Fair Standing balance comment: SUPERVISION for functional mobility of short household distances (3ft)                           ADL either performed or assessed with clinical judgement   ADL Overall ADL's : Needs assistance/impaired     Grooming: Wash/dry hands;Supervision/safety;Standing               Lower Body Dressing: Supervision/safety;Set up;Sitting/lateral leans Lower Body Dressing Details (indicate cue type and reason): to don/doff socks Toilet Transfer: Supervision/safety;Ambulation;Regular Toilet;Grab  bars           Functional mobility during ADLs: Supervision/safety       Vision Baseline Vision/History: 1 Wears glasses Patient Visual Report: No change from  baseline              Pertinent Vitals/Pain Pain Assessment: No/denies pain     Hand Dominance Right   Extremity/Trunk Assessment Upper Extremity Assessment Upper Extremity Assessment: Generalized weakness   Lower Extremity Assessment Lower Extremity Assessment: Generalized weakness   Cervical / Trunk Assessment Cervical / Trunk Assessment: Kyphotic   Communication Communication Communication: No difficulties   Cognition Arousal/Alertness: Awake/alert Behavior During Therapy: WFL for tasks assessed/performed Overall Cognitive Status: Within Functional Limits for tasks assessed                                       General Comments  While on 4L of supplemental O2, SpO2 desat 89% following bed mobility (able to increase to 92% following 1-min of PLB). SpO2 93% following walk to/from toilet and standing hand hygiene.            Home Living Family/patient expects to be discharged to:: Private residence Living Arrangements: Other relatives (neice) Available Help at Discharge: Family;Friend(s);Available PRN/intermittently (ex wife & neice available PRN) Type of Home: House Home Access: Stairs to enter [employer] of Steps: 4 Entrance Stairs-Rails: Right Home Layout: One level     Bathroom Shower/Tub: Teacher, early years/pre: Standard     Home Equipment: None (ex wife had equipment for her deceased husband and may be helpful)          Prior Functioning/Environment Prior Level of Function : Independent/Modified Independent             Mobility Comments: walked with no AD ADLs Comments: independent with ADLs and cooking. Neice assisted with cleaning        OT Problem List: Decreased strength;Decreased activity tolerance;Impaired balance (sitting and/or standing);Cardiopulmonary status limiting activity      OT Treatment/Interventions: Self-care/ADL training;Therapeutic exercise;Energy conservation;DME and/or AE  instruction;Therapeutic activities;Patient/family education;Balance training    OT Goals(Current goals can be found in the care plan section) Acute Rehab OT Goals Patient Stated Goal: to return home OT Goal Formulation: With patient Time For Goal Achievement: 12/24/20 Potential to Achieve Goals: Good ADL Goals Pt Will Perform Grooming: with supervision;standing (while standing for >4 mins) Pt Will Perform Toileting - Clothing Manipulation and hygiene: with supervision;sitting/lateral leans Additional ADL Goal #1: Pt with independently initiate x2 energy conservation strategies during standing ADLs  OT Frequency: Min 2X/week    AM-PAC OT "6 Clicks" Daily Activity     Outcome Measure Help from another person eating meals?: None Help from another person taking care of personal grooming?: A Little Help from another person toileting, which includes using toliet, bedpan, or urinal?: A Little Help from another person bathing (including washing, rinsing, drying)?: A Lot Help from another person to put on and taking off regular upper body clothing?: A Little Help from another person to put on and taking off regular lower body clothing?: A Little 6 Click Score: 18   End of Session Equipment Utilized During Treatment: Gait belt;Oxygen Nurse Communication: Mobility status  Activity Tolerance: Patient tolerated treatment well Patient left: in chair;with call bell/phone within reach;with chair alarm set  OT Visit Diagnosis: Unsteadiness on feet (R26.81);Muscle weakness (generalized) (M62.81)  Time: 1446-1510 OT Time Calculation (min): 24 min Charges:  OT General Charges $OT Visit: 1 Visit OT Evaluation $OT Eval Moderate Complexity: 1 Mod OT Treatments $Self Care/Home Management : 8-22 mins  Fredirick Maudlin, OTR/[facility] [place]

## 2020-12-10 NOTE — Progress Notes (Signed)
*  PRELIMINARY RESULTS* Echocardiogram 2D Echocardiogram has been performed.  Todd Whitehead [name] 12/10/2020, 3:34 PM

## 2020-12-10 NOTE — Evaluation (Signed)
Physical Therapy Evaluation Patient Details Name: Todd Whitehead MRN: [id number] DOB: Aug 01, 1945 Today's Date: 12/10/2020  History of Present Illness  75 yo male with onset of SOB and LE edema with productive cough was admitted 11/11 for care.  Has CHF exacerbation, acute respiratory faiulre, low O2 sats with mobility even on 4L O2 and general weakness.  Has been a moderately heavy smoker, with HA's here.  PMHx:  tobacco use, COPD, CAD, CABG, MGUS, LBP,  Clinical Impression  Pt is up for a walk and noted desaturations on 4L O2 today.  Feeling quite tired still as he was very desaturated yesterday.  Will recommend he is seen by PT for progression of gait and transfers and will need supervised help fo rhome.  If family cannot provide this, will recommend reconsideration for SNF care.  Ex wife is there and somewhat committal, but will need to be sure this help is there until pt is more stable medically.  Follow acutely for goals as are outlined below, and practice stairs when ready.       Recommendations for follow up therapy are one component of a multi-disciplinary discharge planning process, led by the attending physician.  Recommendations may be updated based on patient status, additional functional criteria and insurance authorization.  Follow Up Recommendations Home health PT    Assistance Recommended at Discharge Frequent or constant Supervision/Assistance  Functional Status Assessment Patient has had a recent decline in their functional status and demonstrates the ability to make significant improvements in function in a reasonable and predictable amount of time.  Equipment Recommendations  Rolling walker (2 wheels)    Recommendations for Other Services       Precautions / Restrictions Precautions Precautions: Fall Precaution Comments: monitor O2 sats and HR with all mobility Restrictions Weight Bearing Restrictions: No      Mobility  Bed Mobility Overal bed mobility: Needs  Assistance Bed Mobility: Supine to Sit;Sit to Supine     Supine to sit: Min guard Sit to supine: Min guard   General bed mobility comments: min guard for safety    Transfers Overall transfer level: Needs assistance Equipment used: 1 person hand held assist Transfers: Sit to/from Stand Sit to Stand: Supervision;Min guard           General transfer comment: min guard for safety    Ambulation/Gait Ambulation/Gait assistance: Min guard Gait Distance (Feet): 20 Feet Assistive device: 1 person hand held assist Gait Pattern/deviations: Step-through pattern;Narrow base of support;Trunk flexed;Decreased stride length Gait velocity: reduced Gait velocity interpretation: <1.31 ft/sec, indicative of household ambulator Pre-gait activities: monitored sats, looked at static balance and control of sidesteps General Gait Details: slow pace on 4L O2, sats dropped from 98% to 91%  Stairs            Wheelchair Mobility    Modified Rankin (Stroke Patients Only)       Balance Overall balance assessment: Needs assistance Sitting-balance support: Feet supported Sitting balance-Leahy Scale: Good   Postural control: Posterior lean Standing balance support: Single extremity supported Standing balance-Leahy Scale: Fair                               Pertinent Vitals/Pain Pain Assessment: No/denies pain    Home Living Family/patient expects to be discharged to:: Private residence Living Arrangements: Other relatives Available Help at Discharge: Family;Friend(s);Other (Comment) (ex wife) Type of Home: House Home Access: Stairs to enter Entrance Stairs-Rails: Right Entrance Stairs-Number  of Steps: 4   Home Layout: One level Home Equipment: None (ex wife had equipment for her deceased husband and may be helpful)      Prior Function Prior Level of Function : Independent/Modified Independent             Mobility Comments: walked with no AD ADLs Comments:  did independently     Hand Dominance   Dominant Hand: Right    Extremity/Trunk Assessment   Upper Extremity Assessment Upper Extremity Assessment: Overall WFL for tasks assessed    Lower Extremity Assessment Lower Extremity Assessment: Generalized weakness    Cervical / Trunk Assessment Cervical / Trunk Assessment: Kyphotic  Communication   Communication: No difficulties  Cognition Arousal/Alertness: Awake/alert Behavior During Therapy: WFL for tasks assessed/performed Overall Cognitive Status: Within Functional Limits for tasks assessed                                 General Comments: pt is attentive to instructions and specific about medical issues to report to nursing re tobacco sudden cessation        General Comments General comments (skin integrity, edema, etc.): pt is on O2 with line underfoot, used supervised help to get him around without tripping on the line    Exercises     Assessment/Plan    PT Assessment Patient needs continued PT services  PT Problem List Decreased activity tolerance;Decreased mobility;Cardiopulmonary status limiting activity       PT Treatment Interventions DME instruction;Gait training;Stair training;Functional mobility training;Therapeutic activities;Therapeutic exercise;Balance training;Neuromuscular re-education;Patient/family education    PT Goals (Current goals can be found in the Care Plan section)  Acute Rehab PT Goals Patient Stated Goal: to get stronger and get home PT Goal Formulation: With patient/family Time For Goal Achievement: 12/24/20 Potential to Achieve Goals: Good    Frequency Min 2X/week   Barriers to discharge Decreased caregiver support;Inaccessible home environment has stairs at home with family available to help potentially    Co-evaluation               AM-PAC PT "6 Clicks" Mobility  Outcome Measure Help needed turning from your back to your side while in a flat bed without  using bedrails?: None Help needed moving from lying on your back to sitting on the side of a flat bed without using bedrails?: A Little Help needed moving to and from a bed to a chair (including a wheelchair)?: A Little Help needed standing up from a chair using your arms (e.g., wheelchair or bedside chair)?: A Little Help needed to walk in hospital room?: A Little Help needed climbing 3-5 steps with a railing? : A Little 6 Click Score: 19    End of Session Equipment Utilized During Treatment: Gait belt;Oxygen Activity Tolerance: Patient tolerated treatment well;Treatment limited secondary to medical complications (Comment) Patient left: in bed;with call bell/phone within reach;with bed alarm set;with family/visitor present Nurse Communication: Mobility status;Other (comment) (talked about HA and cessation of tobacco) PT Visit Diagnosis: Unsteadiness on feet (R26.81);Difficulty in walking, not elsewhere classified (R26.2)    Time: 0175-1025 PT Time Calculation (min) (ACUTE ONLY): 15 min   Charges:   PT Evaluation $PT Eval Moderate Complexity: 1 Mod         Ramond Dial 12/10/2020, 12:10 PM  Mee Hives, PT PhD Acute Rehab Dept. Number: [place] [facility] [place]

## 2020-12-10 NOTE — Consult Note (Signed)
[facility] Consultation Note  Patient ID: Todd Whitehead, MRN: [id number], DOB/AGE: May 08, 1945 75 y.o. Admit date: 12/09/2020   Date of Consult: 12/10/2020 Primary Physician: [name], MD Primary Cardiologist: None  Chief Complaint:  Chief Complaint  Patient presents with  . Shortness of Breath   Reason for Consult:  Heart failure  HPI: 75 y.o. male with known coronary artery disease status post coronary bypass graft chronic obstructive pulmonary disease with continued tobacco abuse with concerns of severe shortness of breath having significant progression of lower extremity edema shortness of breath cough congestion over the last 1 to 2 days.  The patient was seen in the emergency room with a chest x-ray showing pulmonary edema and severe expiratory wheezes with hypoxia.  Hemoglobin was 7.9 troponin was 26/28 and BNP was 1527.  EKG showed normal sinus rhythm left atrial enlargement and right bundle branch block.  The patient subsequently was placed on oxygen and had some improvements of his symptoms.  In addition to that diuretics were given for congestive heart failure.  The patient has had some improvements of symptoms at this time.  He is hemodynamically stable  Past Medical History:  Diagnosis Date  . Anxiety   . Arthritis   . Colon polyp   . COPD (chronic obstructive pulmonary disease) ([place])    smoker  . Dysrhythmia   . Glaucoma   . Heart murmur   . Hypertension    on  no meds  . Low serum vitamin B12 11/18/2020  . Melanoma ([place])    Resected from Left upper arm.   . Myocardial infarction ([place]) 2017  . Peripheral vascular disease ([place])   . Prostate enlargement       Surgical History:  Past Surgical History:  Procedure Laterality Date  . BACK SURGERY  01/19/98  . CARDIAC CATHETERIZATION N/A 08/19/2015   Procedure: Left Heart Cath and Coronary Angiography;  Surgeon: [name], MD;  Location: [place] [facility];  Service: Cardiovascular;   Laterality: N/A;  . CHOLECYSTECTOMY N/A 08/19/2016   Procedure: LAPAROSCOPIC CHOLECYSTECTOMY WITH INTRAOPERATIVE CHOLANGIOGRAM;  Surgeon: [name], MD;  Location: [facility] OR;  Service: General;  Laterality: N/A;  . COLON SURGERY  age 2 days old   " bowel was twisted up"  . COLONOSCOPY WITH PROPOFOL N/A 03/19/2016   Procedure: COLONOSCOPY WITH PROPOFOL;  Surgeon: [name], MD;  Location[facility] ENDOSCOPY;  Service: Endoscopy;  Laterality: N/A;  . CORONARY ARTERY BYPASS GRAFT  2017  . ERCP N/A 08/18/2016   Procedure: ENDOSCOPIC RETROGRADE CHOLANGIOPANCREATOGRAPHY (ERCP);  Surgeon: [name], MD;  Location: [place];  Service: Gastroenterology;  Laterality: N/A;  . ESOPHAGOGASTRODUODENOSCOPY (EGD) WITH PROPOFOL N/A 09/18/2018   Procedure: ESOPHAGOGASTRODUODENOSCOPY (EGD) WITH PROPOFOL;  Surgeon: [name], DO;  Location: [facility] ENDOSCOPY;  Service: General;  Laterality: N/A;  . EYE SURGERY Left 03/26/11  . LOWER EXTREMITY ANGIOGRAPHY Left 10/03/2017   Procedure: LOWER EXTREMITY ANGIOGRAPHY;  Surgeon: [name], MD;  Location: [place] [facility];  Service: Cardiovascular;  Laterality: Left;  . LOWER EXTREMITY ANGIOGRAPHY Right 10/31/2017   Procedure: LOWER EXTREMITY ANGIOGRAPHY;  Surgeon: [name], MD;  Location: [place] [facility];  Service: Cardiovascular;  Laterality: Right;  . LUMBAR LAMINECTOMY/DECOMPRESSION MICRODISCECTOMY Right 10/02/2012   Procedure: Right Lumbar Five-Sacral One Lumbar laminotomy/Microdiskectomy;  Surgeon: [name], MD;  Location: [place] [facility];  Service: Neurosurgery;  Laterality: Right;  Right Lumbar Five-Sacral One Lumbar laminotomy/Microdiskectomy  . VISCERAL ANGIOGRAPHY N/A 11/08/2020  Procedure: VISCERAL ANGIOGRAPHY;  Surgeon: Katha Cabal, MD;  Location: [place] [facility];  Service: Cardiovascular;  Laterality: N/A;  . VISCERAL ANGIOGRAPHY N/A 11/25/2020   Procedure: VISCERAL ANGIOGRAPHY;  Surgeon: Katha Cabal, MD;  Location:  [place] [facility];  Service: Cardiovascular;  Laterality: N/A;     Home Meds: Prior to Admission medications   Medication Sig Start Date End Date Taking? Authorizing Provider  acetaminophen (TYLENOL) 500 MG tablet Take 1 tablet (500 mg total) by mouth every 6 (six) hours as needed. Patient taking differently: Take 500 mg by mouth every 6 (six) hours as needed for moderate pain or headache. 08/20/16  Yes Waldron Session, MD  ALPRAZolam Duanne Moron) 0.5 MG tablet Take 0.25 mg by mouth at bedtime as needed for sleep.   Yes [provider]  aspirin EC 81 MG tablet Take 81 mg by mouth daily. Swallow whole.   Yes [provider]  atorvastatin (LIPITOR) 40 MG tablet Take 40 mg by mouth daily.  05/22/17  Yes [provider]  brimonidine (ALPHAGAN) 0.2 % ophthalmic solution Place 1 drop into both eyes 2 (two) times daily. 02/14/16  Yes [provider]  busPIRone (BUSPAR) 30 MG tablet Take 30 mg by mouth 2 (two) times daily.   Yes [provider]  citalopram (CELEXA) 20 MG tablet Take 20 mg by mouth daily. 10/28/19  Yes [provider]  clopidogrel (PLAVIX) 75 MG tablet Take 1 tablet (75 mg total) by mouth daily. 11/26/20  Yes Schnier, Dolores Lory, MD  latanoprost (XALATAN) 0.005 % ophthalmic solution Place 1 drop into both eyes at bedtime.  06/14/17  Yes [provider]  metoprolol succinate (TOPROL-XL) 25 MG 24 hr tablet Take 12.5 mg by mouth daily.   Yes [provider]  QUEtiapine (SEROQUEL) 25 MG tablet Take 25 mg by mouth at bedtime. 07/15/19  Yes [provider]  tamsulosin (FLOMAX) 0.4 MG CAPS capsule Take 1 capsule (0.4 mg total) by mouth daily after breakfast. 08/20/16  Yes Waldron Session, MD  traMADol (ULTRAM) 50 MG tablet Take 50 mg by mouth 2 (two) times daily. 09/25/17  Yes [provider]  traZODone (DESYREL) 50 MG tablet Take 50 mg by mouth at bedtime. 06/02/19  Yes [provider]  sildenafil (REVATIO) 20 MG  tablet TAKE 2 TO 5 TABLETS AS NEEDED FOR ERECTION Patient not taking: No sig reported 12/21/19   Nori Riis, PA-C    Inpatient Medications:  . atorvastatin  40 mg Oral Daily  . brimonidine  1 drop Both Eyes BID  . busPIRone  30 mg Oral BID  . citalopram  20 mg Oral Daily  . clopidogrel  75 mg Oral Daily  . enoxaparin (LOVENOX) injection  40 mg Subcutaneous Q24H  . latanoprost  1 drop Both Eyes QHS  . metoprolol succinate  12.5 mg Oral Daily  . potassium chloride  40 mEq Oral Daily  . QUEtiapine  25 mg Oral QHS  . tamsulosin  0.4 mg Oral QPC breakfast  . traMADol  50 mg Oral BID  . traZODone  50 mg Oral QHS     Allergies:  Allergies  Allergen Reactions  . Other Nausea And Vomiting    Cheese,butter,sour cream,cottage cheese  . Poison Ivy Extract Rash  . Poison Oak Extract Rash    Social History   Socioeconomic History  . Marital status: Divorced    Spouse name: Not on file  . Number of children: Not on file  .  Years of education: Not on file  . Highest education level: Not on file  Occupational History  . Not on file  Tobacco Use  . Smoking status: Every Day    Packs/day: 1.50    Years: 58.00    Pack years: 87.00    Types: Cigarettes  . Smokeless tobacco: Never  Vaping Use  . Vaping Use: Former  Substance and Sexual Activity  . Alcohol use: No  . Drug use: No  . Sexual activity: Not Currently  Other Topics Concern  . Not on file  Social History Narrative  . Not on file   Social Determinants of Health   Financial Resource Strain: Not on file  Food Insecurity: Not on file  Transportation Needs: Not on file  Physical Activity: Not on file  Stress: Not on file  Social Connections: Not on file  Intimate Partner Violence: Not on file     Family History  Problem Relation Age of Onset  . Heart attack Father   . Aneurysm Brother   . Lung cancer Brother      Review of Systems Positive for cough congestion shortness of breath Negative  for: General:  chills, fever, night sweats or weight changes.  Cardiovascular: PND orthopnea syncope dizziness  Dermatological skin lesions rashes Respiratory: Cough congestion Urologic: Frequent urination urination at night and hematuria Abdominal: negative for nausea, vomiting, diarrhea, bright red blood per rectum, melena, or hematemesis Neurologic: negative for visual changes, and/or hearing changes  All other systems reviewed and are otherwise negative except as noted above.  Labs: No results for input(s): CKTOTAL, CKMB, TROPONINI in the last 72 hours. Lab Results  Component Value Date   WBC 5.7 12/10/2020   HGB 7.9 (L) 12/10/2020   HCT 23.5 (L) 12/10/2020   MCV 93.3 12/10/2020   PLT 147 (L) 12/10/2020    Recent Labs  Lab 12/09/20 1103 12/10/20 0317  NA 133* 137  K 3.3* 3.4*  CL 101 100  CO2 27 30  BUN 14 15  CREATININE 1.20 1.03  CALCIUM 8.4* 8.3*  PROT 6.3*  --   BILITOT 0.6  --   ALKPHOS 97  --   ALT 21  --   AST 30  --   GLUCOSE 111* 97   No results found for: CHOL, HDL, LDLCALC, TRIG No results found for: DDIMER  Radiology/Studies:  PERIPHERAL VASCULAR CATHETERIZATION  Result Date: 11/25/2020 See surgical note for result.  DG Chest Portable 1 View  Result Date: 12/09/2020 CLINICAL DATA:  Shortness of breath. EXAM: PORTABLE CHEST 1 VIEW COMPARISON:  09/26/2012 FINDINGS: Signs of previous median sternotomy and CABG procedure. The heart size and mediastinal contours are within normal limits. Increased interstitial markings compatible with pulmonary vascular congestion. No frank edema or pleural effusion. No airspace consolidation. The visualized skeletal structures are unremarkable. IMPRESSION: Increased interstitial markings compatible with pulmonary vascular congestion. Electronically Signed   By: Kerby Moors M.D.   On: 12/09/2020 11:13    EKG: Normal sinus rhythm left atrial enlargement right bundle branch block  Weights: Filed Weights   12/09/20  1042 12/09/20 2341  Weight: 54.9 kg 52.1 kg     Physical Exam: Blood pressure (!) 159/77, pulse 67, temperature 97.7 F (36.5 C), temperature source Oral, resp. rate 18, height 5\' 8"  (1.727 m), weight 52.1 kg, SpO2 100 %. Body mass index is 17.46 kg/m. General: Well developed, well nourished, in no acute distress. Head eyes ears nose throat: Normocephalic, atraumatic, sclera non-icteric, no xanthomas, nares are without  discharge. No apparent thyromegaly and/or mass  Lungs: Normal respiratory effort.  Diffuse wheezes, few diffuse rales, treat rhonchi.  Heart: RRR with normal S1 S2. no murmur gallop, no rub, PMI is normal size and placement, carotid upstroke normal without bruit, jugular venous pressure is normal Abdomen: Soft, non-tender, non-distended with normoactive bowel sounds. No hepatomegaly. No rebound/guarding. No obvious abdominal masses. Abdominal aorta is normal size without bruit Extremities: Ace edema. no cyanosis, no clubbing, no ulcers  Peripheral : 2+ bilateral upper extremity pulses, 2+ bilateral femoral pulses, 2+ bilateral dorsal pedal pulse Neuro: Alert and oriented. No facial asymmetry. No focal deficit. Moves all extremities spontaneously. Musculoskeletal: Normal muscle tone without kyphosis Psych:  Responds to questions appropriately with a normal affect.    Assessment: 75 year old male with known coronary artery disease status post coronary bypass graft exacerbation of COPD causing acute on chronic diastolic dysfunction congestive heart failure with anemia and some pulmonary edema and elevated troponin consistent with demand ischemia and hypoxia rather than acute coronary syndrome  Plan: 1.  Intravenous Lasix for pulmonary edema hypoxia and acute congestive heart failure 2.  Continue supportive care of shortness of breath and COPD exacerbation 3.  Metoprolol for further risk reduction cardiovascular event and murmur further adjustments depending on blood  pressure 4.  High intensity cholesterol therapy 5.  Echocardiogram for LV systolic dysfunction valvular heart disease contributing to above 6.  Further adjustments of medication per above  Signed, Corey Skains M.D. [facility] 12/10/2020, 6:33 AM

## 2020-12-10 NOTE — Progress Notes (Signed)
MD at bedside. 

## 2020-12-11 DIAGNOSIS — R0602 Shortness of breath: Secondary | ICD-10-CM

## 2020-12-11 LAB — BASIC METABOLIC PANEL
Anion gap: 8 (ref 5–15)
BUN: 17 mg/dL (ref 8–23)
CO2: 35 mmol/L — ABNORMAL HIGH (ref 22–32)
Calcium: 8.5 mg/dL — ABNORMAL LOW (ref 8.9–10.3)
Chloride: 91 mmol/L — ABNORMAL LOW (ref 98–111)
Creatinine, Ser: 0.98 mg/dL (ref 0.61–1.24)
GFR, Estimated: >60 mL/min (ref >60)
Glucose, Bld: 106 mg/dL — ABNORMAL HIGH (ref 70–99)
Potassium: 3.3 mmol/L — ABNORMAL LOW (ref 3.5–5.1)
Sodium: 134 mmol/L — ABNORMAL LOW (ref 135–145)

## 2020-12-11 MED ORDER — FUROSEMIDE 10 MG/ML IJ SOLN
80.0000 mg | Freq: Two times a day (BID) | INTRAMUSCULAR | Status: DC
  Administered 2020-12-11 – 2020-12-12 (×3): 80 mg via INTRAVENOUS
  Filled 2020-12-11 (×3): qty 8

## 2020-12-11 MED ORDER — METOPROLOL TARTRATE 25 MG PO TABS
25.0000 mg | ORAL_TABLET | Freq: Two times a day (BID) | ORAL | Status: DC
  Administered 2020-12-11 – 2020-12-12 (×3): 25 mg via ORAL
  Filled 2020-12-11 (×3): qty 1

## 2020-12-11 NOTE — Progress Notes (Signed)
PROGRESS NOTE  Todd Whitehead    DOB: [birth year], 75 y.o.  NIO:[id number]  PCP: [name], MD   Code Status: Full Code   DOA: 12/09/2020   LOS: 2  Brief Narrative of Current Hospitalization  Todd Whitehead is a 75 y.o. male with a PMH significant for COPD, tobacco use, CAD s/p CABG in 2016, MGUS, chronic low back pain. They presented from outpatient clinic to the ED on 12/09/2020 with dyspnea and hypoxia to 70% on room air with no baseline oxygen requirement x 2 weeks gradually worsening associated with lower extremity swelling and cough. In the ED, it was found that they had pulmonary edema on chest x-ray with lower extremity pitting edema and BNP greater than 1500. They were treated with diuresis, supplemental oxygen.  Patient was admitted to medicine service for further workup and management of congestive heart failure as outlined in detail below.  12/11/20 -stable  Assessment & Plan  Active Problems:   CHF (congestive heart failure) (HCC)   Hypoxia   Symptomatic anemia  Acute hypoxic respiratory failure 2/2 volume overload-patient remained stable on 4 L nasal cannula.  No home oxygen requirement -Wean to room air as tolerated -PT/OT - if not improving with increased diuresis today, will plan for diagnostic low-dose chest CT tomorrow. He had a benign lung cancer screening 08/2019 but showed emphysema.  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 UOP of 1L yesterday plus 2 unmeasured.  -Cardiology following, appreciate recs -IV diuretics increased today -Strict I's/O -Daily weights  CAD s/p CABG  HTN  HLD -Continue statin, clopidogrel, metoprolol  COPD-no home medications -DuoNebs as needed  Tobacco use -cessation counseling  Hypokalemia- K+ 3.3 -40 mEq potassium BID  BPH -Continue home Flomax  Chronic anxiety/depression -Continue home Xanax as  needed -Continue home BuSpar and citalopram, Seroquel, trazodone  Chronic low back pain -Continue home tramadol -Continue home MiraLAX  MGUS - f/u OP  DVT prophylaxis: enoxaparin (LOVENOX) injection 40 mg Start: 12/09/20 1530   Diet:  Diet Orders (From admission, onward)     Start     Ordered   12/09/20 1329  Diet Heart Room service appropriate? Yes; Fluid consistency: Thin  Diet effective now       Question Answer Comment  Room service appropriate? Yes   Fluid consistency: Thin      12/09/20 1328            Subjective 12/11/20    Pt reports feeling about the same. He has no complaints today  Disposition Plan & Communication  Patient status: Inpatient  Admitted From: Home Disposition: Home Anticipated discharge date: 11/14  Family Communication: none  Consults, Procedures, Significant Events  Consultants:  cardiology  Procedures/significant events:  Echo  Antimicrobials:  Anti-infectives (From admission, onward)    None       Objective   Vitals:   12/10/20 1500 12/10/20 2030 12/11/20 0047 12/11/20 0558  BP: 131/80 (!) 147/84 (!) 157/76 (!) 146/77  Pulse: 70 67 86 65  Resp: 20 (!) 22 15 16   Temp: 98.4 F (36.9 C) 98.7 F (37.1 C) 97.8 F (36.6 C) 97.7 F (36.5 C)  TempSrc:  Oral Oral Oral  SpO2: 99% 100% 94% 96%  Weight:      Height:        Intake/Output Summary (Last 24 hours) at 12/11/2020 8182 Last data filed at 12/11/2020 0000 Gross  per 24 hour  Intake 1200 ml  Output 2200 ml  Net -1000 ml   Filed Weights   12/09/20 1042 12/09/20 2341 12/10/20 0600  Weight: 54.9 kg 52.1 kg 52.1 kg    Patient BMI: Body mass index is 17.46 kg/m.   Physical Exam: General: awake, alert, NAD, frail HEENT: atraumatic, clear conjunctiva, anicteric sclera, moist mucus membranes, hearing grossly normal Respiratory: normal respiratory effort. Cardiovascular: normal S1/S2,  RRR, no JVD, murmurs, rubs, gallops, quick capillary refill  Gastrointestinal:  soft, NT, ND, no HSM felt Nervous: A&O x3. no gross focal neurologic deficits, normal speech Extremities: moves all equally, trace edema, normal tone Skin: dry, intact, normal temperature, normal color, No rashes, lesions or ulcers Psychiatry: normal mood, congruent affect  Labs   I have personally reviewed following labs and imaging studies Admission on 12/09/2020  Component Date Value Ref Range Status   Sodium 12/09/2020 133 (A)  135 - 145 mmol/L Final   Potassium 12/09/2020 3.3 (A)  3.5 - 5.1 mmol/L Final   Chloride 12/09/2020 101  98 - 111 mmol/L Final   CO2 12/09/2020 27  22 - 32 mmol/L Final   Glucose, Bld 12/09/2020 111 (A)  70 - 99 mg/dL Final   BUN 12/09/2020 14  8 - 23 mg/dL Final   Creatinine, Ser 12/09/2020 1.20  0.61 - 1.24 mg/dL Final   Calcium 12/09/2020 8.4 (A)  8.9 - 10.3 mg/dL Final   Total Protein 12/09/2020 6.3 (A)  6.5 - 8.1 g/dL Final   Albumin 12/09/2020 3.0 (A)  3.5 - 5.0 g/dL Final   AST 12/09/2020 30  15 - 41 U/L Final   ALT 12/09/2020 21  0 - 44 U/L Final   Alkaline Phosphatase 12/09/2020 97  38 - 126 U/L Final   Total Bilirubin 12/09/2020 0.6  0.3 - 1.2 mg/dL Final   GFR, Estimated 12/09/2020 >60  >60 mL/min Final   Anion gap 12/09/2020 5  5 - 15 Final   B Natriuretic Peptide 12/09/2020 1,527.4 (A)  0.0 - 100.0 pg/mL Final   Troponin I (High Sensitivity) 12/09/2020 26 (A)  <18 ng/L Final   Lactic Acid, Venous 12/09/2020 0.8  0.5 - 1.9 mmol/L Final   WBC 12/09/2020 5.2  4.0 - 10.5 K/uL Final   RBC 12/09/2020 2.61 (A)  4.22 - 5.81 MIL/uL Final   Hemoglobin 12/09/2020 8.2 (A)  13.0 - 17.0 g/dL Final   HCT 12/09/2020 24.3 (A)  39.0 - 52.0 % Final   MCV 12/09/2020 93.1  80.0 - 100.0 fL Final   MCH 12/09/2020 31.4  26.0 - 34.0 pg Final   MCHC 12/09/2020 33.7  30.0 - 36.0 g/dL Final   RDW 12/09/2020 17.2 (A)  11.5 - 15.5 % Final   Platelets 12/09/2020 142 (A)  150 - 400 K/uL Final   nRBC 12/09/2020 0.0  0.0 - 0.2 % Final   Neutrophils Relative % 12/09/2020  64  % Final   Neutro Abs 12/09/2020 3.4  1.7 - 7.7 K/uL Final   Lymphocytes Relative 12/09/2020 26  % Final   Lymphs Abs 12/09/2020 1.3  0.7 - 4.0 K/uL Final   Monocytes Relative 12/09/2020 7  % Final   Monocytes Absolute 12/09/2020 0.4  0.1 - 1.0 K/uL Final   Eosinophils Relative 12/09/2020 1  % Final   Eosinophils Absolute 12/09/2020 0.1  0.0 - 0.5 K/uL Final   Basophils Relative 12/09/2020 1  % Final   Basophils Absolute 12/09/2020 0.0  0.0 - 0.1 K/uL Final  Immature Granulocytes 12/09/2020 1  % Final   Abs Immature Granulocytes 12/09/2020 0.04  0.00 - 0.07 K/uL Final   ABO/RH(D) 12/09/2020 O POS   Final   Antibody Screen 12/09/2020 NEG   Final   Sample Expiration 12/09/2020    Final                   Value:12/12/2020,2359 Performed at [facility], [address] [place], [place] [address]    Troponin I (High Sensitivity) 12/09/2020 28 (A)  <18 ng/L Final   SARS Coronavirus 2 by RT PCR 12/09/2020 NEGATIVE  NEGATIVE Final   Influenza A by PCR 12/09/2020 NEGATIVE  NEGATIVE Final   Influenza B by PCR 12/09/2020 NEGATIVE  NEGATIVE Final   Weight 12/10/2020 1,837.75  oz Final   Height 12/10/2020 68  in Final   BP 12/10/2020 164/82  mmHg Final   Ao pk vel 12/10/2020 0.90  m/s Final   AR max vel 12/10/2020 2.85  cm2 Final   AV Peak grad 12/10/2020 3.3  mmHg Final   Single Plane A2C EF 12/10/2020 35.7  % Final   Single Plane A4C EF 12/10/2020 41.4  % Final   Calc EF 12/10/2020 41.2  % Final   S' Lateral 12/10/2020 3.70  cm Final   Area-P 1/2 12/10/2020 4.63  cm2 Final   Sodium 12/10/2020 137  135 - 145 mmol/L Final   Potassium 12/10/2020 3.4 (A)  3.5 - 5.1 mmol/L Final   Chloride 12/10/2020 100  98 - 111 mmol/L Final   CO2 12/10/2020 30  22 - 32 mmol/L Final   Glucose, Bld 12/10/2020 97  70 - 99 mg/dL Final   BUN 12/10/2020 15  8 - 23 mg/dL Final   Creatinine, Ser 12/10/2020 1.03  0.61 - 1.24 mg/dL Final   Calcium 12/10/2020 8.3 (A)  8.9 - 10.3 mg/dL Final   GFR,  Estimated 12/10/2020 >60  >60 mL/min Final   Anion gap 12/10/2020 7  5 - 15 Final   WBC 12/10/2020 5.7  4.0 - 10.5 K/uL Final   RBC 12/10/2020 2.52 (A)  4.22 - 5.81 MIL/uL Final   Hemoglobin 12/10/2020 7.9 (A)  13.0 - 17.0 g/dL Final   HCT 12/10/2020 23.5 (A)  39.0 - 52.0 % Final   MCV 12/10/2020 93.3  80.0 - 100.0 fL Final   MCH 12/10/2020 31.3  26.0 - 34.0 pg Final   MCHC 12/10/2020 33.6  30.0 - 36.0 g/dL Final   RDW 12/10/2020 16.9 (A)  11.5 - 15.5 % Final   Platelets 12/10/2020 147 (A)  150 - 400 K/uL Final   nRBC 12/10/2020 0.0  0.0 - 0.2 % Final   Magnesium 12/10/2020 2.1  1.7 - 2.4 mg/dL Final   Phosphorus 12/10/2020 4.1  2.5 - 4.6 mg/dL Final   Sodium 12/11/2020 134 (A)  135 - 145 mmol/L Final   Potassium 12/11/2020 3.3 (A)  3.5 - 5.1 mmol/L Final   Chloride 12/11/2020 91 (A)  98 - 111 mmol/L Final   CO2 12/11/2020 35 (A)  22 - 32 mmol/L Final   Glucose, Bld 12/11/2020 106 (A)  70 - 99 mg/dL Final   BUN 12/11/2020 17  8 - 23 mg/dL Final   Creatinine, Ser 12/11/2020 0.98  0.61 - 1.24 mg/dL Final   Calcium 12/11/2020 8.5 (A)  8.9 - 10.3 mg/dL Final   GFR, Estimated 12/11/2020 >60  >60 mL/min Final   Anion gap 12/11/2020 8  5 - 15 Final  Imaging Studies  ECHOCARDIOGRAM COMPLETE  Result Date: 12/10/2020    ECHOCARDIOGRAM REPORT   Patient Name:   ELAINE MIDDLETON [place]brook Date of Exam: 12/10/2020 Medical Rec #:  [id number]       Height:       68.0 in Accession #:    [id number]      Weight:       114.9 lb Date of Birth:  05-28-1945       BSA:          1.615 m Patient Age:    22 years        BP:           157/74 mmHg Patient Gender: M               HR:           109 bpm. Exam Location:  [facility] Procedure: 2D Echo, Cardiac Doppler and Color Doppler Indications:     CHF-Acute Diastolic C37.62  History:         Patient has prior history of Echocardiogram examinations. COPD;                  Risk Factors:Hypertension.  Sonographer:     Alyse Low Roar Referring Phys:  [id number] [place] Diagnosing  Phys: Fransico Him MD IMPRESSIONS  1. Left ventricular ejection fraction, by estimation, is 45 to 50%. The left ventricle has mildly decreased function. The left ventricle demonstrates global hypokinesis. There is mild left ventricular hypertrophy of the basal-septal segment. Left ventricular diastolic parameters are indeterminate. Elevated left ventricular end-diastolic pressure.  2. Right ventricular systolic function is normal. The right ventricular size is normal. There is normal pulmonary artery systolic pressure. The estimated right ventricular systolic pressure is 16.0 mmHg.  3. The mitral valve is normal in structure. Mild to moderate mitral valve regurgitation. No evidence of mitral stenosis.  4. The aortic valve is normal in structure. Aortic valve regurgitation is not visualized. No aortic stenosis is present.  5. The inferior vena cava is normal in size with greater than 50% respiratory variability, suggesting right atrial pressure of 3 mmHg.  6. Evidence of atrial level shunting detected by color flow Doppler. There is a small patent foramen ovale. FINDINGS  Left Ventricle: Left ventricular ejection fraction, by estimation, is 45 to 50%. The left ventricle has mildly decreased function. The left ventricle demonstrates global hypokinesis. The left ventricular internal cavity size was normal in size. There is  mild left ventricular hypertrophy of the basal-septal segment. Left ventricular diastolic parameters are indeterminate. Elevated left ventricular end-diastolic pressure. Right Ventricle: The right ventricular size is normal. No increase in right ventricular wall thickness. Right ventricular systolic function is normal. There is normal pulmonary artery systolic pressure. The tricuspid regurgitant velocity is 2.43 m/s, and  with an assumed right atrial pressure of 3 mmHg, the estimated right ventricular systolic pressure is 73.7 mmHg. Left Atrium: Left atrial size was normal in size. Right Atrium:  Right atrial size was normal in size. Pericardium: There is no evidence of pericardial effusion. Mitral Valve: The mitral valve is normal in structure. Mild to moderate mitral valve regurgitation, with eccentric posteriorly directed jet. No evidence of mitral valve stenosis. Tricuspid Valve: The tricuspid valve is normal in structure. Tricuspid valve regurgitation is trivial. No evidence of tricuspid stenosis. Aortic Valve: The aortic valve is normal in structure. Aortic valve regurgitation is not visualized. No aortic stenosis is present. Aortic valve peak gradient measures 3.3 mmHg. Pulmonic Valve: The  pulmonic valve was normal in structure. Pulmonic valve regurgitation is not visualized. No evidence of pulmonic stenosis. Aorta: The aortic root is normal in size and structure. Venous: The inferior vena cava is normal in size with greater than 50% respiratory variability, suggesting right atrial pressure of 3 mmHg. IAS/Shunts: Evidence of atrial level shunting detected by color flow Doppler. A small patent foramen ovale is detected.  LEFT VENTRICLE PLAX 2D LVIDd:         4.70 cm      Diastology LVIDs:         3.70 cm      LV e' medial:    5.77 cm/s LV PW:         1.10 cm      LV E/e' medial:  16.2 LV IVS:        1.30 cm      LV e' lateral:   6.20 cm/s LVOT diam:     1.90 cm      LV E/e' lateral: 15.1 LVOT Area:     2.84 cm  LV Volumes (MOD) LV vol d, MOD A2C: 93.0 ml LV vol d, MOD A4C: 101.0 ml LV vol s, MOD A2C: 59.8 ml LV vol s, MOD A4C: 59.2 ml LV SV MOD A2C:     33.2 ml LV SV MOD A4C:     101.0 ml LV SV MOD BP:      42.6 ml RIGHT VENTRICLE RV Mid diam:    3.00 cm RV S prime:     9.14 cm/s TAPSE (M-mode): 2.0 cm LEFT ATRIUM             Index        RIGHT ATRIUM           Index LA diam:        3.90 cm 2.42 cm/m   RA Area:     13.70 cm LA Vol (A2C):   45.2 ml 27.99 ml/m  RA Volume:   31.80 ml  19.70 ml/m LA Vol (A4C):   42.8 ml 26.51 ml/m LA Biplane Vol: 45.2 ml 27.99 ml/m  AORTIC VALVE                 PULMONIC VALVE AV Area (Vmax): 2.85 cm    PV Vmax:        0.75 m/s AV Vmax:        90.20 cm/s  PV Peak grad:   2.2 mmHg AV Peak Grad:   3.3 mmHg    RVOT Peak grad: 1 mmHg LVOT Vmax:      90.70 cm/s  AORTA Ao Root diam: 3.10 cm MITRAL VALVE               TRICUSPID VALVE MV Area (PHT): 4.63 cm    TR Peak grad:   23.6 mmHg MV Decel Time: 164 msec    TR Vmax:        243.00 cm/s MV E velocity: 93.40 cm/s MV A velocity: 37.70 cm/s  SHUNTS MV E/A ratio:  2.48        Systemic Diam: 1.90 cm MV A Prime:    7.3 cm/s Fransico Him MD Electronically signed by Fransico Him MD Signature Date/Time: 12/10/2020/11:50:33 PM    Final    Medications   Scheduled Meds:  atorvastatin  40 mg Oral Daily   brimonidine  1 drop Both Eyes BID   busPIRone  30 mg Oral BID   citalopram  20 mg Oral Daily   clopidogrel  75 mg Oral Daily   enoxaparin (LOVENOX) injection  40 mg Subcutaneous Q24H   furosemide  40 mg Intravenous Q6H   latanoprost  1 drop Both Eyes QHS   metoprolol succinate  12.5 mg Oral Daily   potassium chloride  40 mEq Oral Daily   QUEtiapine  25 mg Oral QHS   tamsulosin  0.4 mg Oral QPC breakfast   traMADol  50 mg Oral BID   traZODone  50 mg Oral QHS   No recently discontinued medications to reconcile  LOS: 2 days   Time spent: >43min  Wilson Dusenbery L Rayshaun Needle, DO [facility] 12/11/2020, 8:19 AM   Please refer to amion to contact the [facility] Attending or Consulting provider for this pt  [email or web address] Available by Epic secure chat 7AM-7PM. If 7PM-7AM, please contact night-coverage

## 2020-12-11 NOTE — Progress Notes (Signed)
[facility] Encounter Note  Patient: Todd Whitehead / Admit Date: 12/09/2020 / Date of Encounter: 12/11/2020, 11:39 AM   Subjective: Patient has had significant improvements in his overall shortness of breath since admission.  He is not wheezing quite as much and/or have any lower pulmonary rales.  The patient has had some diuresis with the Lasix of 1 L and improvements of pulmonary edema.  Troponin level slightly elevated most consistent with demand ischemia rather than acute coronary syndrome.\\  Echocardiogram showing mild global LV systolic dysfunction with ejection fraction of 45% and no evidence of significant valvular heart disease  Review of Systems: Positive for: Shortness of breath Negative for: Vision change, hearing change, syncope, dizziness, nausea, vomiting,diarrhea, bloody stool, stomach pain, cough, congestion, diaphoresis, urinary frequency, urinary pain,skin lesions, skin rashes Others previously listed  Objective: Telemetry: Normal sinus rhythm Physical Exam: Blood pressure 124/68, pulse 67, temperature (!) 97.5 F (36.4 C), resp. rate 18, height 5\' 8"  (1.727 m), weight 52.1 kg, SpO2 91 %. Body mass index is 17.46 kg/m. General: Well developed, well nourished, in no acute distress. Head: Normocephalic, atraumatic, sclera non-icteric, no xanthomas, nares are without discharge. Neck: No apparent masses Lungs: Normal respirations with use some wheezes, few rhonchi, no rales , no trs crackles   Heart: Regular rate and rhythm, normal S1 S2, no murmur, no rub, no gallop, PMI is normal size and placement, carotid upstroke normal without bruit, jugular venous pressure normal Abdomen: Soft, non-tender, non-distended with normoactive bowel sounds. No hepatosplenomegaly. Abdominal aorta is normal size without bruit Extremities: Ace edema, no clubbing, no cyanosis, no ulcers,  Peripheral: 2+ radial, 2+ femoral, 2+ dorsal pedal pulses Neuro: Alert and  oriented. Moves all extremities spontaneously. Psych:  Responds to questions appropriately with a normal affect.   Intake/Output Summary (Last 24 hours) at 12/11/2020 1139 Last data filed at 12/11/2020 0000 Gross per 24 hour  Intake 960 ml  Output 2200 ml  Net -1240 ml    Inpatient Medications:  . atorvastatin  40 mg Oral Daily  . brimonidine  1 drop Both Eyes BID  . busPIRone  30 mg Oral BID  . citalopram  20 mg Oral Daily  . clopidogrel  75 mg Oral Daily  . enoxaparin (LOVENOX) injection  40 mg Subcutaneous Q24H  . furosemide  80 mg Intravenous BID  . latanoprost  1 drop Both Eyes QHS  . metoprolol succinate  12.5 mg Oral Daily  . potassium chloride  40 mEq Oral Daily  . QUEtiapine  25 mg Oral QHS  . tamsulosin  0.4 mg Oral QPC breakfast  . traMADol  50 mg Oral BID  . traZODone  50 mg Oral QHS   Infusions:   Labs: Recent Labs    12/10/20 0317 12/11/20 0524  NA 137 134*  K 3.4* 3.3*  CL 100 91*  CO2 30 35*  GLUCOSE 97 106*  BUN 15 17  CREATININE 1.03 0.98  CALCIUM 8.3* 8.5*  MG 2.1  --   PHOS 4.1  --    Recent Labs    12/09/20 1103  AST 30  ALT 21  ALKPHOS 97  BILITOT 0.6  PROT 6.3*  ALBUMIN 3.0*   Recent Labs    12/09/20 1103 12/10/20 0317  WBC 5.2 5.7  NEUTROABS 3.4  --   HGB 8.2* 7.9*  HCT 24.3* 23.5*  MCV 93.1 93.3  PLT 142* 147*   No results for input(s): CKTOTAL, CKMB, TROPONINI in the last 72 hours. Invalid  input(s): POCBNP No results for input(s): HGBA1C in the last 72 hours.   Weights: Filed Weights   12/09/20 1042 12/09/20 2341 12/10/20 0600  Weight: 54.9 kg 52.1 kg 52.1 kg     Radiology/Studies:  PERIPHERAL VASCULAR CATHETERIZATION  Result Date: 11/25/2020 See surgical note for result.  DG Chest Portable 1 View  Result Date: 12/09/2020 CLINICAL DATA:  Shortness of breath. EXAM: PORTABLE CHEST 1 VIEW COMPARISON:  09/26/2012 FINDINGS: Signs of previous median sternotomy and CABG procedure. The heart size and mediastinal  contours are within normal limits. Increased interstitial markings compatible with pulmonary vascular congestion. No frank edema or pleural effusion. No airspace consolidation. The visualized skeletal structures are unremarkable. IMPRESSION: Increased interstitial markings compatible with pulmonary vascular congestion. Electronically Signed   By: Kerby Moors M.D.   On: 12/09/2020 11:13   ECHOCARDIOGRAM COMPLETE  Result Date: 12/10/2020    ECHOCARDIOGRAM REPORT   Patient Name:   Todd Whitehead Date of Exam: 12/10/2020 Medical Rec #:  [id number]       Height:       68.0 in Accession #:    [id number]      Weight:       114.9 lb Date of Birth:  09/23/1945       BSA:          1.615 m Patient Age:    75 years        BP:           157/74 mmHg Patient Gender: M               HR:           109 bpm. Exam Location:  [facility] Procedure: 2D Echo, Cardiac Doppler and Color Doppler Indications:     CHF-Acute Diastolic J57.01  History:         Patient has prior history of Echocardiogram examinations. COPD;                  Risk Factors:Hypertension.  Sonographer:     Alyse Low Roar Referring Phys:  [id number] [place] Diagnosing Phys: Fransico Him MD IMPRESSIONS  1. Left ventricular ejection fraction, by estimation, is 45 to 50%. The left ventricle has mildly decreased function. The left ventricle demonstrates global hypokinesis. There is mild left ventricular hypertrophy of the basal-septal segment. Left ventricular diastolic parameters are indeterminate. Elevated left ventricular end-diastolic pressure.  2. Right ventricular systolic function is normal. The right ventricular size is normal. There is normal pulmonary artery systolic pressure. The estimated right ventricular systolic pressure is 00.9 mmHg.  3. The mitral valve is normal in structure. Mild to moderate mitral valve regurgitation. No evidence of mitral stenosis.  4. The aortic valve is normal in structure. Aortic valve regurgitation is not visualized. No aortic  stenosis is present.  5. The inferior vena cava is normal in size with greater than 50% respiratory variability, suggesting right atrial pressure of 3 mmHg.  6. Evidence of atrial level shunting detected by color flow Doppler. There is a small patent foramen ovale. FINDINGS  Left Ventricle: Left ventricular ejection fraction, by estimation, is 45 to 50%. The left ventricle has mildly decreased function. The left ventricle demonstrates global hypokinesis. The left ventricular internal cavity size was normal in size. There is  mild left ventricular hypertrophy of the basal-septal segment. Left ventricular diastolic parameters are indeterminate. Elevated left ventricular end-diastolic pressure. Right Ventricle: The right ventricular size is normal. No increase in right ventricular wall thickness. Right ventricular  systolic function is normal. There is normal pulmonary artery systolic pressure. The tricuspid regurgitant velocity is 2.43 m/s, and  with an assumed right atrial pressure of 3 mmHg, the estimated right ventricular systolic pressure is 98.3 mmHg. Left Atrium: Left atrial size was normal in size. Right Atrium: Right atrial size was normal in size. Pericardium: There is no evidence of pericardial effusion. Mitral Valve: The mitral valve is normal in structure. Mild to moderate mitral valve regurgitation, with eccentric posteriorly directed jet. No evidence of mitral valve stenosis. Tricuspid Valve: The tricuspid valve is normal in structure. Tricuspid valve regurgitation is trivial. No evidence of tricuspid stenosis. Aortic Valve: The aortic valve is normal in structure. Aortic valve regurgitation is not visualized. No aortic stenosis is present. Aortic valve peak gradient measures 3.3 mmHg. Pulmonic Valve: The pulmonic valve was normal in structure. Pulmonic valve regurgitation is not visualized. No evidence of pulmonic stenosis. Aorta: The aortic root is normal in size and structure. Venous: The inferior vena  cava is normal in size with greater than 50% respiratory variability, suggesting right atrial pressure of 3 mmHg. IAS/Shunts: Evidence of atrial level shunting detected by color flow Doppler. A small patent foramen ovale is detected.  LEFT VENTRICLE PLAX 2D LVIDd:         4.70 cm      Diastology LVIDs:         3.70 cm      LV e' medial:    5.77 cm/s LV PW:         1.10 cm      LV E/e' medial:  16.2 LV IVS:        1.30 cm      LV e' lateral:   6.20 cm/s LVOT diam:     1.90 cm      LV E/e' lateral: 15.1 LVOT Area:     2.84 cm  LV Volumes (MOD) LV vol d, MOD A2C: 93.0 ml LV vol d, MOD A4C: 101.0 ml LV vol s, MOD A2C: 59.8 ml LV vol s, MOD A4C: 59.2 ml LV SV MOD A2C:     33.2 ml LV SV MOD A4C:     101.0 ml LV SV MOD BP:      42.6 ml RIGHT VENTRICLE RV Mid diam:    3.00 cm RV S prime:     9.14 cm/s TAPSE (M-mode): 2.0 cm LEFT ATRIUM             Index        RIGHT ATRIUM           Index LA diam:        3.90 cm 2.42 cm/m   RA Area:     13.70 cm LA Vol (A2C):   45.2 ml 27.99 ml/m  RA Volume:   31.80 ml  19.70 ml/m LA Vol (A4C):   42.8 ml 26.51 ml/m LA Biplane Vol: 45.2 ml 27.99 ml/m  AORTIC VALVE                PULMONIC VALVE AV Area (Vmax): 2.85 cm    PV Vmax:        0.75 m/s AV Vmax:        90.20 cm/s  PV Peak grad:   2.2 mmHg AV Peak Grad:   3.3 mmHg    RVOT Peak grad: 1 mmHg LVOT Vmax:      90.70 cm/s  AORTA Ao Root diam: 3.10 cm MITRAL VALVE  TRICUSPID VALVE MV Area (PHT): 4.63 cm    TR Peak grad:   23.6 mmHg MV Decel Time: 164 msec    TR Vmax:        243.00 cm/s MV E velocity: 93.40 cm/s MV A velocity: 37.70 cm/s  SHUNTS MV E/A ratio:  2.48        Systemic Diam: 1.90 cm MV A Prime:    7.3 cm/s Fransico Him MD Electronically signed by Fransico Him MD Signature Date/Time: 12/10/2020/11:50:33 PM    Final      Assessment and Recommendation  75 y.o. male with known coronary disease status post coronary bypass graft chronic obstructive pulmonary disease on oxygen anemia with mild LV systolic  dysfunction by echocardiogram having acute systolic dysfunction congestive heart failure now slightly improved without evidence of myocardial infarction or acute coronary syndrome 1.  Continue intravenous Lasix for 1 more day for further treatment of acute systolic dysfunction congestive heart failure with pulmonary edema 2.  No further cardiac diagnostics necessary at this time due to no evidence of acute coronary syndrome 3.  Oxygen treatment and supportive care for COPD as well as anemia 4.  Begin ambulation and appropriate treatment for cardiac rehabilitation 5.  Continuation of metoprolol and consider addition of low-dose ACE inhibitor for congestive heart failure  Signed, Serafina Royals M.D. FACC

## 2020-12-12 ENCOUNTER — Inpatient Hospital Stay: Payer: Medicare Other

## 2020-12-12 ENCOUNTER — Encounter: Payer: Self-pay | Admitting: Oncology

## 2020-12-12 DIAGNOSIS — I5020 Unspecified systolic (congestive) heart failure: Secondary | ICD-10-CM

## 2020-12-12 LAB — BASIC METABOLIC PANEL
Anion gap: 5 (ref 5–15)
Anion gap: 6 (ref 5–15)
BUN: 21 mg/dL (ref 8–23)
BUN: 23 mg/dL (ref 8–23)
CO2: 35 mmol/L — ABNORMAL HIGH (ref 22–32)
CO2: 36 mmol/L — ABNORMAL HIGH (ref 22–32)
Calcium: 8.4 mg/dL — ABNORMAL LOW (ref 8.9–10.3)
Calcium: 8.7 mg/dL — ABNORMAL LOW (ref 8.9–10.3)
Chloride: 89 mmol/L — ABNORMAL LOW (ref 98–111)
Chloride: 88 mmol/L — ABNORMAL LOW (ref 98–111)
Creatinine, Ser: 1.2 mg/dL (ref 0.61–1.24)
Creatinine, Ser: 1.29 mg/dL — ABNORMAL HIGH (ref 0.61–1.24)
GFR, Estimated: >60 mL/min (ref >60)
GFR, Estimated: 58 mL/min — ABNORMAL LOW (ref >60)
Glucose, Bld: 100 mg/dL — ABNORMAL HIGH (ref 70–99)
Glucose, Bld: 138 mg/dL — ABNORMAL HIGH (ref 70–99)
Potassium: 3.6 mmol/L (ref 3.5–5.1)
Potassium: 3.8 mmol/L (ref 3.5–5.1)
Sodium: 129 mmol/L — ABNORMAL LOW (ref 135–145)
Sodium: 130 mmol/L — ABNORMAL LOW (ref 135–145)

## 2020-12-12 MED ORDER — TRAMADOL HCL 50 MG PO TABS: 50.0000 mg | ORAL_TABLET | Freq: Two times a day (BID) | ORAL | 2 refills | Status: DC | PRN

## 2020-12-12 MED ORDER — BUDESONIDE-FORMOTEROL FUMARATE 80-4.5 MCG/ACT IN AERO: 2.0000 | INHALATION_SPRAY | Freq: Two times a day (BID) | RESPIRATORY_TRACT | 12 refills | Status: DC

## 2020-12-12 MED ORDER — IPRATROPIUM-ALBUTEROL 0.5-2.5 (3) MG/3ML IN SOLN
3.0000 mL | Freq: Two times a day (BID) | RESPIRATORY_TRACT | Status: DC
  Administered 2020-12-12: 3 mL via RESPIRATORY_TRACT
  Filled 2020-12-12: qty 3

## 2020-12-12 NOTE — Progress Notes (Signed)
CSW notes consult for heart failure screen, messaged heart failure RN Jimsey informing of consult.   [place], [place]

## 2020-12-12 NOTE — Progress Notes (Signed)
Oxygen Qualification Note:   SpO2 on room air at rest = 82% SpO2 on room air while ambulating = 79% SpO2 on 3 liters of O2 while ambulating = 93%  Pt desaturates on room air with mobility and requires supplemental oxygen to maintain a safe saturation level.   10:53 AM, 12/12/20 Etta Grandchild, PT, DPT Physical Therapist - [facility]  [phone] [facility])

## 2020-12-12 NOTE — Telephone Encounter (Deleted)
error 

## 2020-12-12 NOTE — Progress Notes (Signed)
Physical Therapy Treatment Patient Details Name: Todd Whitehead MRN: [id number] DOB: [birth year] Today's Date: 12/12/2020   History of Present Illness 75 yo male with onset of SOB and LE edema with productive cough was admitted 11/11 for care.  Has CHF exacerbation, acute respiratory failure, low O2 sats with mobility even on 4L O2 and general weakness.  Has been a moderately heavy smoker, with HA's here.  PMHx:  tobacco use, COPD, CAD, CABG, MGUS, LBP,    PT Comments    Pt in bed upon entry, agreeable to session. Minguard assist for all mobility due to imbalance issues, however strength is fairly good. Pt AMB in room on room air, but desaturation is concerning. Pt able to perform all basic mobility on 3L/min O2. Pt performed 12 stairs today showing ability to access his living space at DC. O2 qualification note written separately.      Recommendations for follow up therapy are one component of a multi-disciplinary discharge planning process, led by the attending physician.  Recommendations may be updated based on patient status, additional functional criteria and insurance authorization.  Follow Up Recommendations  Home health PT     Assistance Recommended at Discharge Intermittent Supervision/Assistance  Equipment Recommendations  Rolling walker (2 wheels)    Recommendations for Other Services       Precautions / Restrictions Precautions Precautions: Fall Restrictions Weight Bearing Restrictions: No     Mobility  Bed Mobility Overal bed mobility: Modified Independent Bed Mobility: Supine to Sit     Supine to sit: Min guard          Transfers Overall transfer level: Needs assistance Equipment used: None Transfers: Sit to/from Stand Sit to Stand: Supervision           General transfer comment: mildly unsteady    Ambulation/Gait   Gait Distance (Feet): 100 Feet Assistive device:  (pushed recliner back from 2B stairwell) Gait Pattern/deviations: Step-to  pattern;WFL(Within Functional Limits)       General Gait Details: slow pace on 3L/min O2   Stairs Stairs: Yes Stairs assistance: Supervision;Min guard Stair Management: One rail Left Number of Stairs: 12 General stair comments: on 3L/min O2; 4 steps, 3x no obvious weakness or imbalance.   Wheelchair Mobility    Modified Rankin (Stroke Patients Only)       Balance Overall balance assessment: Mild deficits observed, not formally tested;Modified Independent             Standing balance comment: SUPERVISION for functional mobility of short household distances (80ft), several signifiucant big sways                            Cognition Arousal/Alertness: Awake/alert Behavior During Therapy: WFL for tasks assessed/performed Overall Cognitive Status: Within Functional Limits for tasks assessed                                          Exercises      General Comments        Pertinent Vitals/Pain Pain Assessment: No/denies pain    Home Living                          Prior Function            PT Goals (current goals can now be found in the care plan section)  Acute Rehab PT Goals Patient Stated Goal: to get stronger and get home PT Goal Formulation: With patient/family Time For Goal Achievement: 12/24/20 Potential to Achieve Goals: Good Progress towards PT goals: Progressing toward goals    Frequency    Min 2X/week      PT Plan Current plan remains appropriate    Co-evaluation              AM-PAC PT "6 Clicks" Mobility   Outcome Measure  Help needed turning from your back to your side while in a flat bed without using bedrails?: None Help needed moving from lying on your back to sitting on the side of a flat bed without using bedrails?: None Help needed moving to and from a bed to a chair (including a wheelchair)?: A Little Help needed standing up from a chair using your arms (e.g., wheelchair or  bedside chair)?: A Little Help needed to walk in hospital room?: A Little Help needed climbing 3-5 steps with a railing? : A Little 6 Click Score: 20    End of Session Equipment Utilized During Treatment: Gait belt;Oxygen Activity Tolerance: Patient tolerated treatment well Patient left: with call bell/phone within reach;with family/visitor present;in chair Nurse Communication: Mobility status PT Visit Diagnosis: Unsteadiness on feet (R26.81);Difficulty in walking, not elsewhere classified (R26.2)     Time: 1020-1045 PT Time Calculation (min) (ACUTE ONLY): 25 min  Charges:  $Gait Training: 8-22 mins $Therapeutic Exercise: 8-22 mins                    5:10 PM, 12/12/20 Todd Whitehead, PT, DPT Physical Therapist - [facility]  [phone] ([place])    Takesha Steger C 12/12/2020, 5:08 PM

## 2020-12-12 NOTE — Care Management Important Message (Signed)
Important Message  Patient Details  Name: Collie Wernick MRN: [id number] Date of Birth: [birth year]   Medicare Important Message Given:  N/A - LOS <3 / Initial given by admissions     Juliann Pulse A Tj Kitchings 12/12/2020, 11:49 AM

## 2020-12-12 NOTE — Progress Notes (Signed)
Chest CT will be performed before discharge. Discharge orders are in.  Physical Therapy completed the ambulation test and put in the notes below qualifying him for Oxygen at home. Please refer to patient's chart.  Oxygen Qualification Note:    SpO2 on room air at rest = 82% SpO2 on room air while ambulating = 79% SpO2 on 3 liters of O2 while ambulating = 93%   Pt desaturates on room air with mobility and requires supplemental oxygen to maintain a safe saturation level

## 2020-12-12 NOTE — Progress Notes (Signed)
Patient is being discharged home with oxygen today. Family on the way to take them home.

## 2020-12-12 NOTE — Progress Notes (Signed)
Patient complained of headache at 8:25 am. 650 mg of Tylenol administered.

## 2020-12-12 NOTE — TOC Transition Note (Signed)
Transition of Care [facility]) - CM/SW Discharge Note   Patient Details  Name: Todd Whitehead MRN: [id number] Date of Birth: 1945-07-11  [facility]) CM/SW Contact:  Alberteen Sam, LCSW Phone Number: 12/12/2020, 1:17 PM   Clinical Narrative:     CSW spoke with patient regarding discharge planning and PT recs of home health, patient reports declining home health and declining rolling walker as he does not need one.   Patient has no preference of agency for O2 needs, informed oxygen will be delivered to room via [employer] and also will have home set up. Reports his family will pick him up to take him today at discharge.   CSW spoke with Freda Munro with [employer], ordered oxygen and will be delivered to room shortly prior to patient dc from the hospital.   No other discharge needs identified at this time.     Final next level of care: Home/Self Care Barriers to Discharge: No Barriers Identified   Patient Goals and CMS Choice Patient states their goals for this hospitalization and ongoing recovery are:: to go home CMS Medicare.gov Compare Post Acute Care list provided to:: Patient Choice offered to / list presented to : Patient  Discharge Placement                    Patient and family notified of of transfer: 12/12/20  Discharge Plan and Services                DME Arranged: Oxygen DME Agency: [employer] Date DME Agency Contacted: 12/12/20 Time DME Agency Contacted: 1308 Representative spoke with at DME Agency: [place] ([place]) Interventions     Readmission Risk Interventions No flowsheet data found.

## 2020-12-12 NOTE — Telephone Encounter (Signed)
Pt informed. Pt is currently in the hospital doesn't know if he will make it to his appt on 11/16 for B12 inj. Please r/s to next week to allow recovery time. Please update pt with new appt into.

## 2020-12-12 NOTE — Consult Note (Signed)
   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  Left ventricular hypertrophy of the basal septal segment.  Diastolic parameters are indeterminate.  Elevated left ventricular end-diastolic pressure normal right ventricular systolic function.  And normal pulmonary artery systolic pressures.  Mild to moderate mitral regurgitation.  He was sent from [facility] clinic after he presented there with dyspnea on minimal mall exertion and bilateral lower extremity edema.  Comorbidities:  Anxiety Arthritis Hypertension Myocardial infarction Continued tobacco abuse  Medications:  Atorvastatin 40 mg daily Plavix 75 mg daily Furosemide 80 mg 2 times a day Metoprolol tartrate 25 mg 2 times a day Tamsulosin 0.4 mg daily  Labs:   Sodium 130, potassium 3.8, chloride 88, CO2 36, BUN 23, creatinine 1.29, admission BNP was 1527 Weight is 47.5 kg Blood pressure 105/54 Intake 960 mL Output 1800 mL  Initial meeting with patient today, he was sitting up in the chair at bedside in no acute distress.  Discussed his heart function.  He states that he prepares most of his meals.  He does use canned goods but states that he rinses them in a colander and prepares them in plain water.  He does admit to using the saltshaker at the table for some foods.  Discussed removing that from the table and trying natural herbs and spices along with Mrs. Dash for seasoning.  Also discussed fluid intake and explained the reasoning behind keeping it to a 64 ounce limit.  Patient felt that he did not go over 2 L in a days time  Went over daily weights and the routine of going to the bathroom and emptying the bladder and then stepping on the scale either in the nude or with the same amount of clothing on.  He voices understanding.  Discussed what to report to physician.  He has a follow-up appointment in the outpatient heart failure clinic on November 21 at 4 PM.  He has a 2% rate of no-show, 1 out of 60  appointments.  He was given a living with heart failure teaching booklet along with his own magnet and information on low-sodium.  Pricilla Riffle RN [facility]

## 2020-12-12 NOTE — Discharge Summary (Signed)
Physician Discharge Summary  Todd Whitehead SEG:[id number] DOB: 01-May-1945 DOA: 12/09/2020  PCP: [name], MD  Admit date: 12/09/2020 Discharge date: 12/13/2020  Admitted From: Home Disposition: Home  Recommendations for Outpatient Follow-up:  Follow up with PCP within 1-2 weeks Follow up pulmonology for ILD Follow up with cardiology for HFrEF  Equipment/Devices:home oxygen requirement  Discharge Condition:stable CODE STATUS:  Code Status: Prior  Regular healthy diet  Brief/Interim Summary: Pt admitted with acute hypoxic respiratory failure due to pulmonary congestion in setting of HFrEF exacerbation. Cardiology was consulted on admission and patient was treated with supplemental oxygen and diuresis (no baseline oxygen requirement). He tolerated treatment well but continued to have oxygen requirement despite euvolemic exam. 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 Obtained chest CT to further evaluate for ILD and showed severe emphysema with signs of aspiration. Patient was evaluated for baseline oxygen requirement on exertion and required 3L [address] to maintain O2 sats so was prescribed home oxygen. He was instructed to follow up with pulmonology for emphysema as well as cardiology for HF.   Discharge Diagnoses:  Active Problems:   CHF (congestive heart failure) (HCC)   Hypoxia   Symptomatic anemia   SOB (shortness of breath)   Discharge Instructions     Amb Referral to HF Clinic   Complete by: As directed    Discharge patient   Complete by: As directed    Discharge disposition: 06-Home-Health Care Svc   Discharge patient date: 12/12/2020   For home use only DME oxygen   Complete by: As directed    Length of Need: 6 Months   Liters per Minute: 3   Frequency: Continuous (stationary and portable oxygen unit needed)   Oxygen conserving device: Yes   Oxygen delivery system: Gas      Allergies as of 12/12/2020       Reactions   Other Nausea And Vomiting    Cheese,butter,sour cream,cottage cheese   Poison Ivy Extract Rash   Poison Oak Extract Rash        Medication List     STOP taking these medications    sildenafil 20 MG tablet Commonly known as: REVATIO       TAKE these medications    acetaminophen 500 MG tablet Commonly known as: TYLENOL Take 1 tablet (500 mg total) by mouth every 6 (six) hours as needed. What changed: reasons to take this   ALPRAZolam 0.5 MG tablet Commonly known as: XANAX Take 0.25 mg by mouth at bedtime as needed for sleep.   aspirin EC 81 MG tablet Take 81 mg by mouth daily. Swallow whole.   atorvastatin 40 MG tablet Commonly known as: LIPITOR Take 40 mg by mouth daily.   brimonidine 0.2 % ophthalmic solution Commonly known as: ALPHAGAN Place 1 drop into both eyes 2 (two) times daily.   budesonide-formoterol 80-4.5 MCG/ACT inhaler Commonly known as: Symbicort Inhale 2 puffs into the lungs 2 (two) times daily.   busPIRone 30 MG tablet Commonly known as: BUSPAR Take 30 mg by mouth 2 (two) times daily.   citalopram 20 MG tablet Commonly known as: CELEXA Take 20 mg by mouth daily.   clopidogrel 75 MG tablet Commonly known as: Plavix Take 1 tablet (75 mg total) by mouth daily.   latanoprost 0.005 % ophthalmic solution Commonly known as: XALATAN Place 1 drop into both eyes at bedtime.   metoprolol succinate 25 MG 24 hr tablet Commonly known as: TOPROL-XL Take 12.5 mg by mouth daily.   QUEtiapine  25 MG tablet Commonly known as: SEROQUEL Take 25 mg by mouth at bedtime.   tamsulosin 0.4 MG Caps capsule Commonly known as: FLOMAX Take 1 capsule (0.4 mg total) by mouth daily after breakfast.   traMADol 50 MG tablet Commonly known as: ULTRAM Take 1 tablet (50 mg total) by mouth every 12 (twelve) hours as needed. What changed:  when to take this reasons to take this   traZODone 50 MG tablet Commonly known as: DESYREL Take 50 mg by mouth at bedtime.                Durable Medical Equipment  (From admission, onward)           Start     Ordered   12/12/20 0000  For home use only DME oxygen       Question Answer Comment  Length of Need 6 Months   Liters per Minute 3   Frequency Continuous (stationary and portable oxygen unit needed)   Oxygen conserving device Yes   Oxygen delivery system Gas      12/12/20 1225            Follow-up Information     Corey Skains, MD. Go on 12/19/2020.   Specialty: Cardiology Why: @ 11am ; You will see Dr. Ubaldo Glassing. Contact information: [address] [facility] [place] [address] [phone]                Allergies  Allergen Reactions   Other Nausea And Vomiting    Cheese,butter,sour cream,cottage cheese   Poison Ivy Extract Rash   Poison Oak Extract Rash    Consultations: Cardiology   Procedures/Studies: CT CHEST WO CONTRAST  Result Date: 12/12/2020 CLINICAL DATA:  Shortness of breath, wheezing EXAM: CT CHEST WITHOUT CONTRAST TECHNIQUE: Multidetector CT imaging of the chest was performed following the standard protocol without IV contrast. COMPARISON:  CT chest, 09/04/2019, CT abdomen pelvis, 07/06/2020 FINDINGS: Cardiovascular: Aortic atherosclerosis. Normal heart size. Three-vessel coronary artery calcifications status post median sternotomy and CABG. No pericardial effusion. Mediastinum/Nodes: No enlarged mediastinal, hilar, or axillary lymph nodes. Thyroid gland, trachea, and esophagus demonstrate no significant findings. Lungs/Pleura: Severe centrilobular and paraseptal emphysema. Diffuse bilateral bronchial wall thickening. There is extensive frothy debris and bronchiolar plugging throughout the bilateral lower lobes, with right greater than left heterogeneous and nodular airspace opacity in the lung bases (series 2, image 139). No pleural effusion or pneumothorax. Upper Abdomen: No acute abnormality. Musculoskeletal: No chest wall mass or suspicious  bone lesions identified. IMPRESSION: 1. There is extensive frothy debris and bronchiolar plugging throughout the bilateral lower lobes, with right greater than left heterogeneous and nodular airspace opacity in the lung bases. Findings are consistent with aspiration. 2. Severe emphysema and diffuse bilateral bronchial wall thickening. 3. Coronary artery disease. Aortic Atherosclerosis (ICD10-I70.0) and Emphysema (ICD10-J43.9). Electronically Signed   By: Delanna Ahmadi M.D.   On: 12/12/2020 16:31   PERIPHERAL VASCULAR CATHETERIZATION  Result Date: 11/25/2020 See surgical note for result.  DG Chest Portable 1 View  Result Date: 12/09/2020 CLINICAL DATA:  Shortness of breath. EXAM: PORTABLE CHEST 1 VIEW COMPARISON:  09/26/2012 FINDINGS: Signs of previous median sternotomy and CABG procedure. The heart size and mediastinal contours are within normal limits. Increased interstitial markings compatible with pulmonary vascular congestion. No frank edema or pleural effusion. No airspace consolidation. The visualized skeletal structures are unremarkable. IMPRESSION: Increased interstitial markings compatible with pulmonary vascular congestion. Electronically Signed   By: Queen Slough.D.  On: 12/09/2020 11:13   ECHOCARDIOGRAM COMPLETE  Result Date: 12/10/2020    ECHOCARDIOGRAM REPORT   Patient Name:   Todd Whitehead Date of Exam: 12/10/2020 Medical Rec #:  [id number]       Height:       68.0 in Accession #:    [id number]      Weight:       114.9 lb Date of Birth:  1945/05/13       BSA:          1.615 m Patient Age:    75 years        BP:           157/74 mmHg Patient Gender: M               HR:           109 bpm. Exam Location:  [facility] Procedure: 2D Echo, Cardiac Doppler and Color Doppler Indications:     CHF-Acute Diastolic L89.37  History:         Patient has prior history of Echocardiogram examinations. COPD;                  Risk Factors:Hypertension.  Sonographer:     Alyse Low Roar Referring Phys:   [id number] [place] Diagnosing Phys: Fransico Him MD IMPRESSIONS  1. Left ventricular ejection fraction, by estimation, is 45 to 50%. The left ventricle has mildly decreased function. The left ventricle demonstrates global hypokinesis. There is mild left ventricular hypertrophy of the basal-septal segment. Left ventricular diastolic parameters are indeterminate. Elevated left ventricular end-diastolic pressure.  2. Right ventricular systolic function is normal. The right ventricular size is normal. There is normal pulmonary artery systolic pressure. The estimated right ventricular systolic pressure is 11.5 mmHg.  3. The mitral valve is normal in structure. Mild to moderate mitral valve regurgitation. No evidence of mitral stenosis.  4. The aortic valve is normal in structure. Aortic valve regurgitation is not visualized. No aortic stenosis is present.  5. The inferior vena cava is normal in size with greater than 50% respiratory variability, suggesting right atrial pressure of 3 mmHg.  6. Evidence of atrial level shunting detected by color flow Doppler. There is a small patent foramen ovale. FINDINGS  Left Ventricle: Left ventricular ejection fraction, by estimation, is 45 to 50%. The left ventricle has mildly decreased function. The left ventricle demonstrates global hypokinesis. The left ventricular internal cavity size was normal in size. There is  mild left ventricular hypertrophy of the basal-septal segment. Left ventricular diastolic parameters are indeterminate. Elevated left ventricular end-diastolic pressure. Right Ventricle: The right ventricular size is normal. No increase in right ventricular wall thickness. Right ventricular systolic function is normal. There is normal pulmonary artery systolic pressure. The tricuspid regurgitant velocity is 2.43 m/s, and  with an assumed right atrial pressure of 3 mmHg, the estimated right ventricular systolic pressure is 72.6 mmHg. Left Atrium: Left atrial size was  normal in size. Right Atrium: Right atrial size was normal in size. Pericardium: There is no evidence of pericardial effusion. Mitral Valve: The mitral valve is normal in structure. Mild to moderate mitral valve regurgitation, with eccentric posteriorly directed jet. No evidence of mitral valve stenosis. Tricuspid Valve: The tricuspid valve is normal in structure. Tricuspid valve regurgitation is trivial. No evidence of tricuspid stenosis. Aortic Valve: The aortic valve is normal in structure. Aortic valve regurgitation is not visualized. No aortic stenosis is present. Aortic valve peak gradient measures 3.3 mmHg. Pulmonic  Valve: The pulmonic valve was normal in structure. Pulmonic valve regurgitation is not visualized. No evidence of pulmonic stenosis. Aorta: The aortic root is normal in size and structure. Venous: The inferior vena cava is normal in size with greater than 50% respiratory variability, suggesting right atrial pressure of 3 mmHg. IAS/Shunts: Evidence of atrial level shunting detected by color flow Doppler. A small patent foramen ovale is detected.  LEFT VENTRICLE PLAX 2D LVIDd:         4.70 cm      Diastology LVIDs:         3.70 cm      LV e' medial:    5.77 cm/s LV PW:         1.10 cm      LV E/e' medial:  16.2 LV IVS:        1.30 cm      LV e' lateral:   6.20 cm/s LVOT diam:     1.90 cm      LV E/e' lateral: 15.1 LVOT Area:     2.84 cm  LV Volumes (MOD) LV vol d, MOD A2C: 93.0 ml LV vol d, MOD A4C: 101.0 ml LV vol s, MOD A2C: 59.8 ml LV vol s, MOD A4C: 59.2 ml LV SV MOD A2C:     33.2 ml LV SV MOD A4C:     101.0 ml LV SV MOD BP:      42.6 ml RIGHT VENTRICLE RV Mid diam:    3.00 cm RV S prime:     9.14 cm/s TAPSE (M-mode): 2.0 cm LEFT ATRIUM             Index        RIGHT ATRIUM           Index LA diam:        3.90 cm 2.42 cm/m   RA Area:     13.70 cm LA Vol (A2C):   45.2 ml 27.99 ml/m  RA Volume:   31.80 ml  19.70 ml/m LA Vol (A4C):   42.8 ml 26.51 ml/m LA Biplane Vol: 45.2 ml 27.99 ml/m   AORTIC VALVE                PULMONIC VALVE AV Area (Vmax): 2.85 cm    PV Vmax:        0.75 m/s AV Vmax:        90.20 cm/s  PV Peak grad:   2.2 mmHg AV Peak Grad:   3.3 mmHg    RVOT Peak grad: 1 mmHg LVOT Vmax:      90.70 cm/s  AORTA Ao Root diam: 3.10 cm MITRAL VALVE               TRICUSPID VALVE MV Area (PHT): 4.63 cm    TR Peak grad:   23.6 mmHg MV Decel Time: 164 msec    TR Vmax:        243.00 cm/s MV E velocity: 93.40 cm/s MV A velocity: 37.70 cm/s  SHUNTS MV E/A ratio:  2.48        Systemic Diam: 1.90 cm MV A Prime:    7.3 cm/s Fransico Him MD Electronically signed by Fransico Him MD Signature Date/Time: 12/10/2020/11:50:33 PM    Final     Subjective: Patient feels well. He denies SOB, LE edema, CP.   Discharge Exam: Vitals:   12/12/20 1149 12/12/20 1553  BP: (!) 105/54 108/65  Pulse: (!) 59 60  Resp: 17 18  Temp:  72 F (36.7 C) 98.1 F (36.7 C)  SpO2: 98% 97%    General: Pt is alert, awake, not in acute distress, thin Cardiovascular: RRR, S1/S2 +, no rubs, no gallops Respiratory: CTA bilaterally, no wheezing, no rhonchi Abdominal: Soft, NT, ND, bowel sounds + Extremities: no edema, no cyanosis  Labs: Basic Metabolic Panel: Recent Labs  Lab 12/09/20 1103 12/10/20 0317 12/11/20 0524 12/12/20 0425 12/12/20 1137  NA 133* 137 134* 129* 130*  K 3.3* 3.4* 3.3* 3.6 3.8  CL 101 100 91* 89* 88*  CO2 27 30 35* 35* 36*  GLUCOSE 111* 97 106* 100* 138*  BUN 14 15 17 21 23   CREATININE 1.20 1.03 0.98 1.20 1.29*  CALCIUM 8.4* 8.3* 8.5* 8.4* 8.7*  MG  --  2.1  --   --   --   PHOS  --  4.1  --   --   --    CBC: Recent Labs  Lab 12/09/20 1103 12/10/20 0317  WBC 5.2 5.7  NEUTROABS 3.4  --   HGB 8.2* 7.9*  HCT 24.3* 23.5*  MCV 93.1 93.3  PLT 142* 147*    Microbiology Recent Results (from the past 240 hour(s))  Resp Panel by RT-PCR (Flu A&B, Covid) Nasopharyngeal Swab     Status: None   Collection Time: 12/09/20  2:00 PM   Specimen: Nasopharyngeal Swab; Nasopharyngeal(NP)  swabs in vial transport medium  Result Value Ref Range Status   SARS Coronavirus 2 by RT PCR NEGATIVE NEGATIVE Final    Comment: (NOTE) SARS-CoV-2 target nucleic acids are NOT DETECTED.  The SARS-CoV-2 RNA is generally detectable in upper respiratory specimens during the acute phase of infection. The lowest concentration of SARS-CoV-2 viral copies this assay can detect is 138 copies/mL. A negative result does not preclude SARS-Cov-2 infection and should not be used as the sole basis for treatment or other patient management decisions. A negative result may occur with  improper specimen collection/handling, submission of specimen other than nasopharyngeal swab, presence of viral mutation(s) within the areas targeted by this assay, and inadequate number of viral copies(<138 copies/mL). A negative result must be combined with clinical observations, patient history, and epidemiological information. The expected result is Negative.  Fact Sheet for Patients:  EntrepreneurPulse.com.au  Fact Sheet for Healthcare Providers:  IncredibleEmployment.be  This test is no t yet approved or cleared by the [place] FDA and  has been authorized for detection and/or diagnosis of SARS-CoV-2 by FDA under an Emergency Use Authorization (EUA). This EUA will remain  in effect (meaning this test can be used) for the duration of the COVID-19 declaration under Section 564(b)(1) of the Act, 21 U.S.C.section 360bbb-3(b)(1), unless the authorization is terminated  or revoked sooner.       Influenza A by PCR NEGATIVE NEGATIVE Final   Influenza B by PCR NEGATIVE NEGATIVE Final    Comment: (NOTE) The Xpert Xpress SARS-CoV-2/FLU/RSV plus assay is intended as an aid in the diagnosis of influenza from Nasopharyngeal swab specimens and should not be used as a sole basis for treatment. Nasal washings and aspirates are unacceptable for Xpert Xpress  SARS-CoV-2/FLU/RSV testing.  Fact Sheet for Patients: EntrepreneurPulse.com.au  Fact Sheet for Healthcare Providers: IncredibleEmployment.be  This test is not yet approved or cleared by the [place] FDA and has been authorized for detection and/or diagnosis of SARS-CoV-2 by FDA under an Emergency Use Authorization (EUA). This EUA will remain in effect (meaning this test can be used) for the duration of the COVID-19 declaration  under Section 564(b)(1) of the Act, 21 U.S.C. section 360bbb-3(b)(1), unless the authorization is terminated or revoked.  Performed at [facility], [address], [place], [place] [address]     Time coordinating discharge: Over 30 minutes  Richarda Osmond, MD  [facility] 12/13/2020, 1:23 PM Pager   If 7PM-7AM, please contact night-coverage [email or web address] Password [email or web address]

## 2020-12-12 NOTE — Discharge Instructions (Signed)
Please follow up with heart failure clinic for management of your heart failure.  Please follow up with your primary doctor in the next 1-2 weeks to monitor your breathing status and review your oxygen requirement for worsening lung function.  You must not smoke at all while using oxygen and I recommend stopping completely.

## 2020-12-13 ENCOUNTER — Telehealth: Payer: Self-pay | Admitting: Oncology

## 2020-12-13 NOTE — Telephone Encounter (Signed)
Ok, great. Must have been an old message then.

## 2020-12-13 NOTE — Telephone Encounter (Signed)
Pt called to state that he missed his appt due to being in hosp. Wants to reschedule. Call back at [phone]

## 2020-12-13 NOTE — Telephone Encounter (Signed)
Appt was r/s yesterday. Abby will you call him and update him of appt details next week.

## 2020-12-13 NOTE — Telephone Encounter (Signed)
I already spoke to patient yesterday. Him and his wife agreed to move B12 to next week.

## 2020-12-14 ENCOUNTER — Inpatient Hospital Stay: Payer: Medicare Other

## 2020-12-14 ENCOUNTER — Other Ambulatory Visit: Payer: Self-pay

## 2020-12-14 DIAGNOSIS — E538 Deficiency of other specified B group vitamins: Secondary | ICD-10-CM | POA: Diagnosis not present

## 2020-12-14 MED ORDER — CYANOCOBALAMIN 1000 MCG/ML IJ SOLN
1000.0000 ug | Freq: Once | INTRAMUSCULAR | Status: AC
  Administered 2020-12-14: 1000 ug via INTRAMUSCULAR
  Filled 2020-12-14: qty 1

## 2020-12-15 ENCOUNTER — Other Ambulatory Visit: Payer: Self-pay

## 2020-12-15 DIAGNOSIS — D472 Monoclonal gammopathy: Secondary | ICD-10-CM

## 2020-12-18 NOTE — Progress Notes (Deleted)
   Patient ID: Todd Whitehead, male    DOB: 1945-04-06, 75 y.o.   MRN: [id number]  HPI  Todd Whitehead is a 75 y/o male with a history of   Echo report from 12/10/20 reviewed and showed an EF of 45-50% along with mild LVH and mild/moderate Todd.   LHC done 08/19/15 and showed: Mid LAD lesion, 80% stenosed. Mid Cx lesion, 85% stenosed. Mid RCA lesion, 100% stenosed. LM lesion, 50% stenosed. 3 vessel cad. Mildly reduced lv funciton Will need referral for cabg.   Admitted 12/09/20 due to HF exacerbation. Initially given IV lasix with transition to oral diuretics. Given supplemental oxygen. Chest CT showed severe emphysema. Cardiology consult obtained. Discharged after 3 days.    He presents for an initial visit with a chief complaint of           Review of Systems    Physical Exam  Assessment & Plan:  1: Chronic heart failure with mildly reduced ejection fraction- - NYHA class  - BNP 12/09/20 was 1527.4  2: HTN- - BP - saw PCP Ouida Sills) 11/16/20 - BMP 12/12/20 reviewed and showed sodium 130, potassium 3.8, creatinine 1.29 and GFR 58  3: COPD-  4: CAD- CABG  done 2017 - had telemedicine visit with cardiology (Fath) 12/14/20

## 2020-12-19 ENCOUNTER — Ambulatory Visit: Payer: Medicare Other | Attending: Family | Admitting: Family

## 2020-12-19 ENCOUNTER — Ambulatory Visit: Payer: Medicare Other | Admitting: Family

## 2020-12-19 ENCOUNTER — Other Ambulatory Visit: Payer: Self-pay

## 2020-12-19 ENCOUNTER — Encounter: Payer: Self-pay | Admitting: Family

## 2020-12-19 VITALS — BP 131/83 | HR 106 | Resp 16 | Ht 67.0 in | Wt 108.0 lb

## 2020-12-19 DIAGNOSIS — J449 Chronic obstructive pulmonary disease, unspecified: Secondary | ICD-10-CM

## 2020-12-19 DIAGNOSIS — Z7902 Long term (current) use of antithrombotics/antiplatelets: Secondary | ICD-10-CM | POA: Diagnosis not present

## 2020-12-19 DIAGNOSIS — Z79899 Other long term (current) drug therapy: Secondary | ICD-10-CM | POA: Insufficient documentation

## 2020-12-19 DIAGNOSIS — Z720 Tobacco use: Secondary | ICD-10-CM | POA: Diagnosis not present

## 2020-12-19 DIAGNOSIS — I252 Old myocardial infarction: Secondary | ICD-10-CM | POA: Insufficient documentation

## 2020-12-19 DIAGNOSIS — I509 Heart failure, unspecified: Secondary | ICD-10-CM | POA: Diagnosis not present

## 2020-12-19 DIAGNOSIS — Z9981 Dependence on supplemental oxygen: Secondary | ICD-10-CM | POA: Diagnosis not present

## 2020-12-19 DIAGNOSIS — Z8249 Family history of ischemic heart disease and other diseases of the circulatory system: Secondary | ICD-10-CM | POA: Diagnosis not present

## 2020-12-19 DIAGNOSIS — J439 Emphysema, unspecified: Secondary | ICD-10-CM | POA: Insufficient documentation

## 2020-12-19 DIAGNOSIS — Z7951 Long term (current) use of inhaled steroids: Secondary | ICD-10-CM | POA: Diagnosis not present

## 2020-12-19 DIAGNOSIS — I10 Essential (primary) hypertension: Secondary | ICD-10-CM | POA: Diagnosis not present

## 2020-12-19 DIAGNOSIS — I110 Hypertensive heart disease with heart failure: Secondary | ICD-10-CM | POA: Diagnosis not present

## 2020-12-19 DIAGNOSIS — I5022 Chronic systolic (congestive) heart failure: Secondary | ICD-10-CM

## 2020-12-19 DIAGNOSIS — I2510 Atherosclerotic heart disease of native coronary artery without angina pectoris: Secondary | ICD-10-CM | POA: Diagnosis not present

## 2020-12-19 DIAGNOSIS — F17210 Nicotine dependence, cigarettes, uncomplicated: Secondary | ICD-10-CM | POA: Diagnosis not present

## 2020-12-19 DIAGNOSIS — Z951 Presence of aortocoronary bypass graft: Secondary | ICD-10-CM | POA: Insufficient documentation

## 2020-12-19 LAB — IFE+PROTEIN ELECTRO, 24-HR UR
% BETA, Urine: 11.5 %
ALPHA 1 URINE: 6.7 %
Albumin, U: 67.5 %
Alpha 2, Urine: 6.1 %
GAMMA GLOBULIN URINE: 8.2 %
Total Protein, Urine-Ur/day: 525 mg/24 hr — ABNORMAL HIGH (ref 30–150)
Total Protein, Urine: 40.4 mg/dL
Total Volume: 1300

## 2020-12-19 NOTE — Progress Notes (Signed)
Patient ID: Todd Whitehead, male    DOB: August 12, 1945, 75 y.o.   MRN: [id number]  HPI  Todd Whitehead is a 75 y/o male with a history of HTN, anxiety, COPD, CAD (MI), PVD, current tobacco use and chronic heart failure.   Echo report from 12/10/20 reviewed and showed an EF of 45-50% along with mild LVH and mild/moderate Todd.   LHC done 08/19/15 and showed: Mid LAD lesion, 80% stenosed. Mid Cx lesion, 85% stenosed. Mid RCA lesion, 100% stenosed. LM lesion, 50% stenosed. 3 vessel cad. Mildly reduced lv funciton  Admitted 12/09/20 due to HF exacerbation. Initially given IV lasix with transition to oral diuretics. Needed supplemental oxygen. Chest CT showed severe emphysema signs of aspiration. Cardiology consult obtained. Discharged after 3 days on oxygen.    He presents today for his initial visit with a chief complaint of moderate shortness of breath with little exertion. Says that this has been present for several years and that he can get short of breath even when getting dressed. He has associated fatigue, palpitations and anxious along with this. He denies any dizziness, difficulty sleeping, abdominal distention, pedal edema, chest pain, wheezing, cough or weight gain.   Has been having issues with using his portable oxygen tank and when he arrived to the office, his tank was virtually empty and his pulse ox was 70% with cold fingers. Immediately placed on our tank on 4L and he very quickly rose to 100% with improved color and skin temperature. Has been on his portable tank since earlier today because he had another appointment earlier today. Does check his oxygen level at home.  Continues to smoke 1.5 ppd of cigarettes and says that he "sometimes" turns his oxygen off when smoking and other times, smokes while he is wearing his oxygen.   Has never seen pulmonologist but says that he would like to as he doesn't feel like his inhalers work well. Has recently had his furosemide 20mg  resumed.   Past  Medical History:  Diagnosis Date   Anxiety    Arthritis    CHF (congestive heart failure) (HCC)    Colon polyp    COPD (chronic obstructive pulmonary disease) ([place])    smoker   Dysrhythmia    Glaucoma    Heart murmur    Hypertension    on  no meds   Low serum vitamin B12 11/18/2020   Melanoma ([place])    Resected from Left upper arm.    Myocardial infarction [facility]) 2017   Peripheral vascular disease [facility])    Prostate enlargement    Past Surgical History:  Procedure Laterality Date   BACK SURGERY  01/19/98   CARDIAC CATHETERIZATION N/A 08/19/2015   Procedure: Left Heart Cath and Coronary Angiography;  Surgeon: [name], MD;  Location: [place] CV LAB;  Service: Cardiovascular;  Laterality: N/A;   CHOLECYSTECTOMY N/A 08/19/2016   Procedure: LAPAROSCOPIC CHOLECYSTECTOMY WITH INTRAOPERATIVE CHOLANGIOGRAM;  Surgeon: [name], MD;  Location: [place];  Service: General;  Laterality: N/A;   COLON SURGERY  age 57 days old   " bowel was twisted up"   COLONOSCOPY WITH PROPOFOL N/A 03/19/2016   Procedure: COLONOSCOPY WITH PROPOFOL;  Surgeon: [name], MD;  Location: [facility] ENDOSCOPY;  Service: Endoscopy;  Laterality: N/A;   CORONARY ARTERY BYPASS GRAFT  2017   ERCP N/A 08/18/2016   Procedure: ENDOSCOPIC RETROGRADE CHOLANGIOPANCREATOGRAPHY (ERCP);  Surgeon: [name], MD;  Location: [place];  Service: Gastroenterology;  Laterality: N/A;   ESOPHAGOGASTRODUODENOSCOPY (  EGD) WITH PROPOFOL N/A 09/18/2018   Procedure: ESOPHAGOGASTRODUODENOSCOPY (EGD) WITH PROPOFOL;  Surgeon: [name], DO;  Location: [place];  Service: General;  Laterality: N/A;   EYE SURGERY Left 03/26/11   LOWER EXTREMITY ANGIOGRAPHY Left 10/03/2017   Procedure: LOWER EXTREMITY ANGIOGRAPHY;  Surgeon: [name], MD;  Location: [place] CV LAB;  Service: Cardiovascular;  Laterality: Left;   LOWER EXTREMITY ANGIOGRAPHY Right 10/31/2017   Procedure: LOWER EXTREMITY ANGIOGRAPHY;  Surgeon: [name], MD;  Location: [place] CV LAB;  Service: Cardiovascular;  Laterality: Right;   LUMBAR LAMINECTOMY/DECOMPRESSION MICRODISCECTOMY Right 10/02/2012   Procedure: Right Lumbar Five-Sacral One Lumbar laminotomy/Microdiskectomy;  Surgeon: [name], MD;  Location: [place] NEURO ORS;  Service: Neurosurgery;  Laterality: Right;  Right Lumbar Five-Sacral One Lumbar laminotomy/Microdiskectomy   VISCERAL ANGIOGRAPHY N/A 11/08/2020   Procedure: VISCERAL ANGIOGRAPHY;  Surgeon: Katha Cabal, MD;  Location: [place] CV LAB;  Service: Cardiovascular;  Laterality: N/A;   VISCERAL ANGIOGRAPHY N/A 11/25/2020   Procedure: VISCERAL ANGIOGRAPHY;  Surgeon: Katha Cabal, MD;  Location: [place] CV LAB;  Service: Cardiovascular;  Laterality: N/A;   Family History  Problem Relation Age of Onset   Heart attack Father    Aneurysm Brother    Lung cancer Brother    Social History   Tobacco Use   Smoking status: Every Day    Packs/day: 1.50    Years: 58.00    Pack years: 87.00    Types: Cigarettes   Smokeless tobacco: Never  Substance Use Topics   Alcohol use: No   Allergies  Allergen Reactions   Other Nausea And Vomiting    Cheese,butter,sour cream,cottage cheese   Poison Ivy Extract Rash   Poison Oak Extract Rash   Prior to Admission medications   Medication Sig Start Date End Date Taking? Authorizing Provider  acetaminophen (TYLENOL) 500 MG tablet Take 1 tablet (500 mg total) by mouth every 6 (six) hours as needed. Patient taking differently: Take 500 mg by mouth every 6 (six) hours as needed for moderate pain or headache. 08/20/16  Yes Waldron Session, MD  ALPRAZolam Duanne Moron) 0.5 MG tablet Take 0.25 mg by mouth at bedtime as needed for sleep.   Yes [provider]  aspirin EC 81 MG tablet Take 81 mg by mouth daily. Swallow whole.   Yes [provider]  atorvastatin (LIPITOR) 40 MG tablet Take 40 mg by mouth daily.  05/22/17  Yes [provider]   brimonidine (ALPHAGAN) 0.2 % ophthalmic solution Place 1 drop into both eyes 2 (two) times daily. 02/14/16  Yes [provider]  budesonide-formoterol (SYMBICORT) 80-4.5 MCG/ACT inhaler Inhale 2 puffs into the lungs 2 (two) times daily. 12/12/20  Yes Richarda Osmond, MD  busPIRone (BUSPAR) 30 MG tablet Take 30 mg by mouth 2 (two) times daily.   Yes [provider]  citalopram (CELEXA) 20 MG tablet Take 20 mg by mouth daily. 10/28/19  Yes [provider]  clopidogrel (PLAVIX) 75 MG tablet Take 1 tablet (75 mg total) by mouth daily. 11/26/20  Yes Schnier, Dolores Lory, MD  furosemide (LASIX) 20 MG tablet Take 20 mg by mouth daily. 12/14/20  Yes [provider]  latanoprost (XALATAN) 0.005 % ophthalmic solution Place 1 drop into both eyes at bedtime.  06/14/17  Yes [provider]  metoprolol succinate (TOPROL-XL) 25 MG 24 hr tablet Take 12.5 mg by mouth daily.   Yes [provider]  QUEtiapine (SEROQUEL) 25 MG tablet Take  25 mg by mouth at bedtime. 07/15/19  Yes [provider]  tamsulosin (FLOMAX) 0.4 MG CAPS capsule Take 1 capsule (0.4 mg total) by mouth daily after breakfast. 08/20/16  Yes Waldron Session, MD  traMADol (ULTRAM) 50 MG tablet Take 1 tablet (50 mg total) by mouth every 12 (twelve) hours as needed. 12/12/20  Yes Richarda Osmond, MD  traZODone (DESYREL) 50 MG tablet Take 50 mg by mouth at bedtime. 06/02/19  Yes [provider]   Review of Systems  Constitutional:  Positive for fatigue (easily). Negative for appetite change.  HENT:  Negative for congestion, postnasal drip and sore throat.   Eyes: Negative.   Respiratory:  Positive for shortness of breath. Negative for cough, chest tightness and wheezing.   Cardiovascular:  Positive for palpitations (at times). Negative for chest pain and leg swelling.  Gastrointestinal:  Negative for abdominal distention and abdominal pain.  Endocrine: Negative.   Genitourinary:  Negative.   Musculoskeletal:  Negative for back pain and neck pain.  Skin: Negative.   Allergic/Immunologic: Negative.   Neurological:  Negative for dizziness and light-headedness.  Hematological:  Negative for adenopathy. Does not bruise/bleed easily.  Psychiatric/Behavioral:  Negative for dysphoric mood and sleep disturbance (sleeping on 1 pillow). The patient is nervous/anxious.    Vitals:   12/19/20 1330  BP: 131/83  Pulse: (!) 106  Resp: 16  SpO2: 100%  Weight: 108 lb (49 kg)  Height: 5\' 7"  (1.702 m)   Wt Readings from Last 3 Encounters:  12/19/20 108 lb (49 kg)  12/12/20 104 lb 11.2 oz (47.5 kg)  12/07/20 121 lb 3.2 oz (55 kg)   Lab Results  Component Value Date   CREATININE 1.29 (H) 12/12/2020   CREATININE 1.20 12/12/2020   CREATININE 0.98 12/11/2020   Physical Exam Vitals and nursing note reviewed. Exam conducted with a chaperone present (friend).  Constitutional:      Appearance: Normal appearance.  HENT:     Head: Normocephalic and atraumatic.  Cardiovascular:     Rate and Rhythm: Regular rhythm. Tachycardia present.  Pulmonary:     Effort: Pulmonary effort is normal.     Breath sounds: No wheezing, rhonchi or rales.  Abdominal:     General: Abdomen is flat.     Palpations: Abdomen is soft.     Tenderness: There is no abdominal tenderness.  Musculoskeletal:        General: No tenderness.     Cervical back: Normal range of motion and neck supple.     Right lower leg: No edema.     Left lower leg: No edema.  Skin:    General: Skin is warm and dry.  Neurological:     General: No focal deficit present.     Mental Status: He is alert and oriented to person, place, and time.  Psychiatric:        Mood and Affect: Mood is anxious.        Behavior: Behavior normal.        Thought Content: Thought content normal.    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)- - NYHA class III -  euvolemic today - weighing daily; reminded to call for an overnight weight gain of > 2 pounds or a weekly weight gain of > 5 pounds - not adding salt but does like saltier foods; likes to eat bacon every morning for breakfast, reviewed making lower sodium choices; low sodium cookbook provided -  saw cardiology ([facility]) earlier today; returns 03/21/21 - on GDMT of metoprolol - consider adding entresto, spironolactone or SGLT2 as able with BP - has just had furosemide 20mg  resumed - BNP 12/09/20 was 1527.4  2: HTN- - BP looks good (131/83) - saw PCP Ouida Sills) 11/16/20 - BMP 12/12/20 reviewed and showed sodium 130, potassium 3.8, creatinine 1.29 & GFR 58  3: severe COPD- - wearing oxygen at 3-4L around the clock although tank was virtually empty upon arrival and was hypoxic; quickly improved once placed on our tank - has never seen pulmonologist so referral made and appointment was scheduled for 01/06/21  4: Tobacco use- - smoking 1.5 ppd of cigarettes but does endorse a desire to quit - has been smoking since the age of 10 - admits that he doesn't always remove his oxygen when smoking; emphasized that he MUST remove the oxygen and turn it off or leave the room when smoking to decrease the risk of fire  Medication bottles reviewed.   Return in 2 months or sooner for any questions/problems before then.

## 2020-12-19 NOTE — Patient Instructions (Addendum)
Continue weighing daily and call for an overnight weight gain of > 2 pounds or a weekly weight gain of >5 pounds.    [facility] Pulmonary Dr. Raul Del 01/06/21 at [place] [address]  [phone]

## 2020-12-21 ENCOUNTER — Ambulatory Visit (INDEPENDENT_AMBULATORY_CARE_PROVIDER_SITE_OTHER): Payer: Medicare Other

## 2020-12-21 ENCOUNTER — Encounter (INDEPENDENT_AMBULATORY_CARE_PROVIDER_SITE_OTHER): Payer: Self-pay | Admitting: Nurse Practitioner

## 2020-12-21 ENCOUNTER — Other Ambulatory Visit: Payer: Self-pay

## 2020-12-21 ENCOUNTER — Ambulatory Visit (INDEPENDENT_AMBULATORY_CARE_PROVIDER_SITE_OTHER): Payer: Medicare Other | Admitting: Nurse Practitioner

## 2020-12-21 ENCOUNTER — Inpatient Hospital Stay: Payer: Medicare Other

## 2020-12-21 VITALS — BP 167/83 | HR 73 | Resp 16 | Wt 119.0 lb

## 2020-12-21 DIAGNOSIS — I700 Atherosclerosis of aorta: Secondary | ICD-10-CM | POA: Diagnosis not present

## 2020-12-21 DIAGNOSIS — R109 Unspecified abdominal pain: Secondary | ICD-10-CM

## 2020-12-21 DIAGNOSIS — E785 Hyperlipidemia, unspecified: Secondary | ICD-10-CM

## 2020-12-21 DIAGNOSIS — I771 Stricture of artery: Secondary | ICD-10-CM | POA: Diagnosis not present

## 2020-12-21 DIAGNOSIS — Z87891 Personal history of nicotine dependence: Secondary | ICD-10-CM

## 2020-12-21 DIAGNOSIS — I10 Essential (primary) hypertension: Secondary | ICD-10-CM

## 2020-12-26 ENCOUNTER — Encounter (INDEPENDENT_AMBULATORY_CARE_PROVIDER_SITE_OTHER): Payer: Self-pay | Admitting: Nurse Practitioner

## 2020-12-26 NOTE — Progress Notes (Signed)
Subjective:    Patient ID: Todd Whitehead, male    DOB: 10-10-1945, 75 y.o.   MRN: [id number] Chief Complaint  Patient presents with   Follow-up    3wk ultrasound follow up    Todd Whitehead is a 75 year old male returns to the office for follow-up follow recent mesenteric angiogram with stent placement to the celiac artery origin.  The patient denies abdominal pain or postprandial symptoms.  The patient denies weight loss as well as nausea.  The patient does not substantiate food fear, particular foods do not seem to aggravate or alleviate the symptoms.  The patient denies bloody bowel movements or diarrhea.  The patient has a history of colonoscopy which was not diagnostic.  No history of peptic ulcer disease.  The patient and his wife note that he is feeling much better and is eating well.  No prior peripheral angiograms or vascular interventions.  The patient denies amaurosis fugax or recent TIA symptoms. There are no recent neurological changes noted. The patient denies claudication symptoms or rest pain symptoms. The patient denies history of DVT, PE or superficial thrombophlebitis. The patient denies recent episodes of angina    Noninvasive studies show no evidence of stenosis of the superior mesenteric artery and inferior mesenteric artery splenic artery and celiac arteries.  The celiac artery stent is widely patent.   Review of Systems  Gastrointestinal:  Negative for abdominal distention and abdominal pain.  All other systems reviewed and are negative.     Objective:   Physical Exam Vitals reviewed.  HENT:     Head: Normocephalic.  Cardiovascular:     Rate and Rhythm: Normal rate.     Pulses: Decreased pulses.  Pulmonary:     Effort: Pulmonary effort is normal.  Skin:    General: Skin is warm and dry.  Neurological:     Mental Status: He is alert and oriented to person, place, and time.  Psychiatric:        Mood and Affect: Mood normal.        Behavior: Behavior  normal.        Thought Content: Thought content normal.        Judgment: Judgment normal.    BP (!) 167/83 (BP Location: Right Arm)   Pulse 73   Resp 16   Wt 119 lb (54 kg)   BMI 18.64 kg/m   Past Medical History:  Diagnosis Date   Anxiety    Arthritis    CHF (congestive heart failure) (HCC)    Colon polyp    COPD (chronic obstructive pulmonary disease) (HCC)    smoker   Dysrhythmia    Glaucoma    Heart murmur    Hypertension    on  no meds   Low serum vitamin B12 11/18/2020   Melanoma ([place])    Resected from Left upper arm.    Myocardial infarction [facility]) 2017   Peripheral vascular disease ([place])    Prostate enlargement     Social History   Socioeconomic History   Marital status: Divorced    Spouse name: Not on file   Number of children: Not on file   Years of education: Not on file   Highest education level: Not on file  Occupational History   Not on file  Tobacco Use   Smoking status: Every Day    Packs/day: 1.50    Years: 58.00    Pack years: 87.00    Types: Cigarettes   Smokeless tobacco:  Never  Vaping Use   Vaping Use: Former  Substance and Sexual Activity   Alcohol use: No   Drug use: No   Sexual activity: Not Currently  Other Topics Concern   Not on file  Social History Narrative   Not on file   Social Determinants of Health   Financial Resource Strain: Not on file  Food Insecurity: Not on file  Transportation Needs: Not on file  Physical Activity: Not on file  Stress: Not on file  Social Connections: Not on file  Intimate Partner Violence: Not on file    Past Surgical History:  Procedure Laterality Date   BACK SURGERY  01/19/98   CARDIAC CATHETERIZATION N/A 08/19/2015   Procedure: Left Heart Cath and Coronary Angiography;  Surgeon: [name], MD;  Location: [place] CV LAB;  Service: Cardiovascular;  Laterality: N/A;   CHOLECYSTECTOMY N/A 08/19/2016   Procedure: LAPAROSCOPIC CHOLECYSTECTOMY WITH INTRAOPERATIVE CHOLANGIOGRAM;   Surgeon: [name], MD;  Location: [place];  Service: General;  Laterality: N/A;   COLON SURGERY  age 80 days old   " bowel was twisted up"   COLONOSCOPY WITH PROPOFOL N/A 03/19/2016   Procedure: COLONOSCOPY WITH PROPOFOL;  Surgeon: [name], MD;  Location: [facility] ENDOSCOPY;  Service: Endoscopy;  Laterality: N/A;   CORONARY ARTERY BYPASS GRAFT  2017   ERCP N/A 08/18/2016   Procedure: ENDOSCOPIC RETROGRADE CHOLANGIOPANCREATOGRAPHY (ERCP);  Surgeon: [name], MD;  Location: [place];  Service: Gastroenterology;  Laterality: N/A;   ESOPHAGOGASTRODUODENOSCOPY (EGD) WITH PROPOFOL N/A 09/18/2018   Procedure: ESOPHAGOGASTRODUODENOSCOPY (EGD) WITH PROPOFOL;  Surgeon: [name], DO;  Location: [place];  Service: General;  Laterality: N/A;   EYE SURGERY Left 03/26/11   LOWER EXTREMITY ANGIOGRAPHY Left 10/03/2017   Procedure: LOWER EXTREMITY ANGIOGRAPHY;  Surgeon: [name], MD;  Location: [place] CV LAB;  Service: Cardiovascular;  Laterality: Left;   LOWER EXTREMITY ANGIOGRAPHY Right 10/31/2017   Procedure: LOWER EXTREMITY ANGIOGRAPHY;  Surgeon: [name], MD;  Location: [place] CV LAB;  Service: Cardiovascular;  Laterality: Right;   LUMBAR LAMINECTOMY/DECOMPRESSION MICRODISCECTOMY Right 10/02/2012   Procedure: Right Lumbar Five-Sacral One Lumbar laminotomy/Microdiskectomy;  Surgeon: [name], MD;  Location: [place] NEURO ORS;  Service: Neurosurgery;  Laterality: Right;  Right Lumbar Five-Sacral One Lumbar laminotomy/Microdiskectomy   VISCERAL ANGIOGRAPHY N/A 11/08/2020   Procedure: VISCERAL ANGIOGRAPHY;  Surgeon: Katha Cabal, MD;  Location: [place] CV LAB;  Service: Cardiovascular;  Laterality: N/A;   VISCERAL ANGIOGRAPHY N/A 11/25/2020   Procedure: VISCERAL ANGIOGRAPHY;  Surgeon: Katha Cabal, MD;  Location: [place] CV LAB;  Service: Cardiovascular;  Laterality: N/A;    Family History  Problem Relation Age of Onset   Heart attack Father     Aneurysm Brother    Lung cancer Brother     Allergies  Allergen Reactions   Other Nausea And Vomiting    Cheese,butter,sour cream,cottage cheese   Poison Ivy Extract Rash   Poison Oak Extract Rash    CBC Latest Ref Rng & Units 12/10/2020 12/09/2020 11/18/2020  WBC 4.0 - 10.5 K/uL 5.7 5.2 5.7  Hemoglobin 13.0 - 17.0 g/dL 7.9(L) 8.2(L) 9.8(L)  Hematocrit 39.0 - 52.0 % 23.5(L) 24.3(L) 28.7(L)  Platelets 150 - 400 K/uL 147(L) 142(L) 343      CMP     Component Value Date/Time   NA 130 (L) 12/12/2020 1137   NA 138 07/06/2020 1505   K 3.8 12/12/2020 1137   CL 88 (L) 12/12/2020 1137  CO2 36 (H) 12/12/2020 1137   GLUCOSE 138 (H) 12/12/2020 1137   BUN 23 12/12/2020 1137   BUN 18 07/06/2020 1505   CREATININE 1.29 (H) 12/12/2020 1137   CALCIUM 8.7 (L) 12/12/2020 1137   PROT 6.3 (L) 12/09/2020 1103   PROT 6.3 07/06/2020 1505   ALBUMIN 3.0 (L) 12/09/2020 1103   ALBUMIN 4.1 07/06/2020 1505   AST 30 12/09/2020 1103   ALT 21 12/09/2020 1103   ALKPHOS 97 12/09/2020 1103   BILITOT 0.6 12/09/2020 1103   BILITOT 0.3 07/06/2020 1505   GFRNONAA 58 (L) 12/12/2020 1137   GFRAA 78 10/30/2019 1424     No results found.     Assessment & Plan:   1. Celiac artery stenosis (HCC) Recommend:  The patient is status post successful angiogram with intervention of the mesenteric vessels.  Stent placement to the celiac artery origin was performed.  The patient reports that the abdominal pain is improved and the post prandial symptoms are essentially gone.   The patient denies lifestyle limiting changes at this point in time.  No further invasive studies, angiography or surgery at this time The patient should continue walking and begin a more formal exercise program.  The patient should continue antiplatelet therapy and aggressive treatment of the lipid abnormalities  Smoking cessation was again discussed  Patient should undergo noninvasive studies as ordered in 3 months The patient  will follow up with me after the studies.    2. Primary hypertension Continue antihypertensive medications as already ordered, these medications have been reviewed and there are no changes at this time.   3. Hyperlipidemia, unspecified hyperlipidemia type Continue statin as ordered and reviewed, no changes at this time   4. Personal history of tobacco use, presenting hazards to health Smoking cessation was discussed, 3-10 minutes spent on this topic specifically    Current Outpatient Medications on File Prior to Visit  Medication Sig Dispense Refill   acetaminophen (TYLENOL) 500 MG tablet Take 1 tablet (500 mg total) by mouth every 6 (six) hours as needed. (Patient taking differently: Take 500 mg by mouth every 6 (six) hours as needed for moderate pain or headache.) 30 tablet 0   ALPRAZolam (XANAX) 0.5 MG tablet Take 0.25 mg by mouth at bedtime as needed for sleep.     aspirin EC 81 MG tablet Take 81 mg by mouth daily. Swallow whole.     atorvastatin (LIPITOR) 40 MG tablet Take 40 mg by mouth daily.      brimonidine (ALPHAGAN) 0.2 % ophthalmic solution Place 1 drop into both eyes 2 (two) times daily.     budesonide-formoterol (SYMBICORT) 80-4.5 MCG/ACT inhaler Inhale 2 puffs into the lungs 2 (two) times daily. 1 each 12   busPIRone (BUSPAR) 30 MG tablet Take 30 mg by mouth 2 (two) times daily.     citalopram (CELEXA) 20 MG tablet Take 20 mg by mouth daily.     clopidogrel (PLAVIX) 75 MG tablet Take 1 tablet (75 mg total) by mouth daily. 30 tablet 5   furosemide (LASIX) 20 MG tablet Take 20 mg by mouth daily.     latanoprost (XALATAN) 0.005 % ophthalmic solution Place 1 drop into both eyes at bedtime.   2   metoprolol succinate (TOPROL-XL) 25 MG 24 hr tablet Take 12.5 mg by mouth daily.     QUEtiapine (SEROQUEL) 25 MG tablet Take 25 mg by mouth at bedtime.     tamsulosin (FLOMAX) 0.4 MG CAPS capsule Take 1 capsule (0.4 mg  total) by mouth daily after breakfast. 30 capsule 2   traMADol  (ULTRAM) 50 MG tablet Take 1 tablet (50 mg total) by mouth every 12 (twelve) hours as needed. 30 tablet 2   traZODone (DESYREL) 50 MG tablet Take 50 mg by mouth at bedtime.     No current facility-administered medications on file prior to visit.    There are no Patient Instructions on file for this visit. No follow-ups on file.   Kris Hartmann, NP

## 2021-01-11 ENCOUNTER — Other Ambulatory Visit: Payer: Self-pay

## 2021-01-11 ENCOUNTER — Inpatient Hospital Stay: Payer: Medicare Other | Attending: Oncology

## 2021-01-11 DIAGNOSIS — E538 Deficiency of other specified B group vitamins: Secondary | ICD-10-CM | POA: Diagnosis not present

## 2021-01-11 MED ORDER — CYANOCOBALAMIN 1000 MCG/ML IJ SOLN
1000.0000 ug | Freq: Once | INTRAMUSCULAR | Status: AC
  Administered 2021-01-11: 14:00:00 1000 ug via INTRAMUSCULAR
  Filled 2021-01-11: qty 1

## 2021-01-26 ENCOUNTER — Emergency Department: Payer: Medicare Other

## 2021-01-26 ENCOUNTER — Other Ambulatory Visit: Payer: Self-pay

## 2021-01-26 ENCOUNTER — Inpatient Hospital Stay
Admission: EM | Admit: 2021-01-26 | Discharge: 2021-01-30 | DRG: 291 | Disposition: A | Discharge status: Home Health | Payer: Medicare Other | Attending: Internal Medicine | Admitting: Internal Medicine

## 2021-01-26 ENCOUNTER — Encounter: Payer: Self-pay | Admitting: Emergency Medicine

## 2021-01-26 DIAGNOSIS — Z85820 Personal history of malignant melanoma of skin: Secondary | ICD-10-CM

## 2021-01-26 DIAGNOSIS — I110 Hypertensive heart disease with heart failure: Principal | ICD-10-CM | POA: Diagnosis present

## 2021-01-26 DIAGNOSIS — Z951 Presence of aortocoronary bypass graft: Secondary | ICD-10-CM | POA: Diagnosis not present

## 2021-01-26 DIAGNOSIS — Z79899 Other long term (current) drug therapy: Secondary | ICD-10-CM

## 2021-01-26 DIAGNOSIS — R778 Other specified abnormalities of plasma proteins: Secondary | ICD-10-CM

## 2021-01-26 DIAGNOSIS — I252 Old myocardial infarction: Secondary | ICD-10-CM

## 2021-01-26 DIAGNOSIS — Z7902 Long term (current) use of antithrombotics/antiplatelets: Secondary | ICD-10-CM

## 2021-01-26 DIAGNOSIS — I2510 Atherosclerotic heart disease of native coronary artery without angina pectoris: Secondary | ICD-10-CM | POA: Diagnosis present

## 2021-01-26 DIAGNOSIS — Z7982 Long term (current) use of aspirin: Secondary | ICD-10-CM | POA: Diagnosis not present

## 2021-01-26 DIAGNOSIS — D638 Anemia in other chronic diseases classified elsewhere: Secondary | ICD-10-CM | POA: Diagnosis present

## 2021-01-26 DIAGNOSIS — J9621 Acute and chronic respiratory failure with hypoxia: Secondary | ICD-10-CM | POA: Diagnosis present

## 2021-01-26 DIAGNOSIS — F17200 Nicotine dependence, unspecified, uncomplicated: Secondary | ICD-10-CM | POA: Diagnosis present

## 2021-01-26 DIAGNOSIS — E871 Hypo-osmolality and hyponatremia: Secondary | ICD-10-CM | POA: Diagnosis present

## 2021-01-26 DIAGNOSIS — I5022 Chronic systolic (congestive) heart failure: Secondary | ICD-10-CM | POA: Diagnosis present

## 2021-01-26 DIAGNOSIS — Z9981 Dependence on supplemental oxygen: Secondary | ICD-10-CM | POA: Diagnosis not present

## 2021-01-26 DIAGNOSIS — R042 Hemoptysis: Secondary | ICD-10-CM | POA: Diagnosis present

## 2021-01-26 DIAGNOSIS — I10 Essential (primary) hypertension: Secondary | ICD-10-CM | POA: Diagnosis not present

## 2021-01-26 DIAGNOSIS — E43 Unspecified severe protein-calorie malnutrition: Secondary | ICD-10-CM | POA: Diagnosis present

## 2021-01-26 DIAGNOSIS — D649 Anemia, unspecified: Secondary | ICD-10-CM | POA: Diagnosis present

## 2021-01-26 DIAGNOSIS — Z801 Family history of malignant neoplasm of trachea, bronchus and lung: Secondary | ICD-10-CM | POA: Diagnosis not present

## 2021-01-26 DIAGNOSIS — E876 Hypokalemia: Secondary | ICD-10-CM | POA: Diagnosis present

## 2021-01-26 DIAGNOSIS — J189 Pneumonia, unspecified organism: Secondary | ICD-10-CM | POA: Diagnosis present

## 2021-01-26 DIAGNOSIS — F17210 Nicotine dependence, cigarettes, uncomplicated: Secondary | ICD-10-CM | POA: Diagnosis present

## 2021-01-26 DIAGNOSIS — R911 Solitary pulmonary nodule: Secondary | ICD-10-CM | POA: Diagnosis present

## 2021-01-26 DIAGNOSIS — E785 Hyperlipidemia, unspecified: Secondary | ICD-10-CM | POA: Diagnosis present

## 2021-01-26 DIAGNOSIS — F418 Other specified anxiety disorders: Secondary | ICD-10-CM | POA: Diagnosis present

## 2021-01-26 DIAGNOSIS — J449 Chronic obstructive pulmonary disease, unspecified: Secondary | ICD-10-CM | POA: Diagnosis not present

## 2021-01-26 DIAGNOSIS — Z66 Do not resuscitate: Secondary | ICD-10-CM | POA: Diagnosis present

## 2021-01-26 DIAGNOSIS — Z7951 Long term (current) use of inhaled steroids: Secondary | ICD-10-CM

## 2021-01-26 DIAGNOSIS — Z8249 Family history of ischemic heart disease and other diseases of the circulatory system: Secondary | ICD-10-CM

## 2021-01-26 DIAGNOSIS — I5023 Acute on chronic systolic (congestive) heart failure: Secondary | ICD-10-CM | POA: Diagnosis not present

## 2021-01-26 DIAGNOSIS — Z720 Tobacco use: Secondary | ICD-10-CM | POA: Diagnosis not present

## 2021-01-26 DIAGNOSIS — J439 Emphysema, unspecified: Secondary | ICD-10-CM | POA: Diagnosis present

## 2021-01-26 DIAGNOSIS — I739 Peripheral vascular disease, unspecified: Secondary | ICD-10-CM | POA: Diagnosis present

## 2021-01-26 DIAGNOSIS — I160 Hypertensive urgency: Secondary | ICD-10-CM | POA: Diagnosis present

## 2021-01-26 DIAGNOSIS — Z20822 Contact with and (suspected) exposure to covid-19: Secondary | ICD-10-CM | POA: Diagnosis present

## 2021-01-26 DIAGNOSIS — Z681 Body mass index (BMI) 19 or less, adult: Secondary | ICD-10-CM | POA: Diagnosis not present

## 2021-01-26 DIAGNOSIS — I5043 Acute on chronic combined systolic (congestive) and diastolic (congestive) heart failure: Secondary | ICD-10-CM | POA: Diagnosis present

## 2021-01-26 DIAGNOSIS — R7989 Other specified abnormal findings of blood chemistry: Secondary | ICD-10-CM | POA: Diagnosis present

## 2021-01-26 DIAGNOSIS — Z955 Presence of coronary angioplasty implant and graft: Secondary | ICD-10-CM

## 2021-01-26 DIAGNOSIS — N400 Enlarged prostate without lower urinary tract symptoms: Secondary | ICD-10-CM | POA: Diagnosis present

## 2021-01-26 LAB — RESP PANEL BY RT-PCR (FLU A&B, COVID) ARPGX2
Influenza A by PCR: NEGATIVE
Influenza B by PCR: NEGATIVE
SARS Coronavirus 2 by RT PCR: NEGATIVE

## 2021-01-26 LAB — COMPREHENSIVE METABOLIC PANEL
ALT: 33 U/L (ref 0–44)
AST: 37 U/L (ref 15–41)
Albumin: 3.5 g/dL (ref 3.5–5.0)
Alkaline Phosphatase: 121 U/L (ref 38–126)
Anion gap: 7 (ref 5–15)
BUN: 14 mg/dL (ref 8–23)
CO2: 29 mmol/L (ref 22–32)
Calcium: 8.3 mg/dL — ABNORMAL LOW (ref 8.9–10.3)
Chloride: 95 mmol/L — ABNORMAL LOW (ref 98–111)
Creatinine, Ser: 0.82 mg/dL (ref 0.61–1.24)
GFR, Estimated: >60 mL/min (ref >60)
Glucose, Bld: 166 mg/dL — ABNORMAL HIGH (ref 70–99)
Potassium: 3.4 mmol/L — ABNORMAL LOW (ref 3.5–5.1)
Sodium: 131 mmol/L — ABNORMAL LOW (ref 135–145)
Total Bilirubin: 0.6 mg/dL (ref 0.3–1.2)
Total Protein: 6.7 g/dL (ref 6.5–8.1)

## 2021-01-26 LAB — CBC
HCT: 26 % — ABNORMAL LOW (ref 39.0–52.0)
Hemoglobin: 8.6 g/dL — ABNORMAL LOW (ref 13.0–17.0)
MCH: 30.8 pg (ref 26.0–34.0)
MCHC: 33.1 g/dL (ref 30.0–36.0)
MCV: 93.2 fL (ref 80.0–100.0)
Platelets: 188 10*3/uL (ref 150–400)
RBC: 2.79 MIL/uL — ABNORMAL LOW (ref 4.22–5.81)
RDW: 15.2 % (ref 11.5–15.5)
WBC: 6.3 10*3/uL (ref 4.0–10.5)
nRBC: 0 % (ref 0.0–0.2)

## 2021-01-26 LAB — MAGNESIUM: Magnesium: 2 mg/dL (ref 1.7–2.4)

## 2021-01-26 LAB — BRAIN NATRIURETIC PEPTIDE: B Natriuretic Peptide: 1567.1 pg/mL — ABNORMAL HIGH (ref 0.0–100.0)

## 2021-01-26 LAB — PROCALCITONIN: Procalcitonin: <0.1 ng/mL

## 2021-01-26 LAB — PROTIME-INR
INR: 1.1 (ref 0.8–1.2)
INR: 1.1 (ref 0.8–1.2)
Prothrombin Time: 14.1 seconds (ref 11.4–15.2)
Prothrombin Time: 13.8 seconds (ref 11.4–15.2)

## 2021-01-26 LAB — HEMOGLOBIN A1C
Hgb A1c MFr Bld: 5.4 % (ref 4.8–5.6)
Mean Plasma Glucose: 108.28 mg/dL

## 2021-01-26 LAB — TROPONIN I (HIGH SENSITIVITY)
Troponin I (High Sensitivity): 34 ng/L — ABNORMAL HIGH (ref <18)
Troponin I (High Sensitivity): 37 ng/L — ABNORMAL HIGH (ref <18)
Troponin I (High Sensitivity): 48 ng/L — ABNORMAL HIGH (ref <18)

## 2021-01-26 LAB — APTT
aPTT: 39 seconds — ABNORMAL HIGH (ref 24–36)
aPTT: 38 seconds — ABNORMAL HIGH (ref 24–36)

## 2021-01-26 MED ORDER — SODIUM CHLORIDE 0.9 % IV SOLN
500.0000 mg | INTRAVENOUS | Status: DC
  Administered 2021-01-27 – 2021-01-28 (×2): 500 mg via INTRAVENOUS
  Filled 2021-01-26 (×2): qty 5
  Filled 2021-01-26: qty 500

## 2021-01-26 MED ORDER — ATORVASTATIN CALCIUM 20 MG PO TABS
40.0000 mg | ORAL_TABLET | Freq: Every day | ORAL | Status: DC
  Administered 2021-01-26 – 2021-01-30 (×5): 40 mg via ORAL
  Filled 2021-01-26 (×5): qty 2

## 2021-01-26 MED ORDER — IOHEXOL 350 MG/ML SOLN
75.0000 mL | Freq: Once | INTRAVENOUS | Status: AC | PRN
  Administered 2021-01-26: 15:00:00 75 mL via INTRAVENOUS

## 2021-01-26 MED ORDER — METOPROLOL SUCCINATE ER 25 MG PO TB24
12.5000 mg | ORAL_TABLET | Freq: Every day | ORAL | Status: DC
  Administered 2021-01-26 – 2021-01-30 (×5): 12.5 mg via ORAL
  Filled 2021-01-26 (×2): qty 1
  Filled 2021-01-26: qty 0.5
  Filled 2021-01-26: qty 1
  Filled 2021-01-26: qty 0.5

## 2021-01-26 MED ORDER — SODIUM CHLORIDE 0.9 % IV SOLN
1.0000 g | INTRAVENOUS | Status: DC
  Administered 2021-01-27 – 2021-01-28 (×2): 1 g via INTRAVENOUS
  Filled 2021-01-26: qty 1
  Filled 2021-01-26 (×2): qty 10

## 2021-01-26 MED ORDER — MOMETASONE FURO-FORMOTEROL FUM 100-5 MCG/ACT IN AERO
2.0000 | INHALATION_SPRAY | Freq: Two times a day (BID) | RESPIRATORY_TRACT | Status: DC
  Administered 2021-01-27 – 2021-01-30 (×2): 2 via RESPIRATORY_TRACT
  Filled 2021-01-26: qty 8.8

## 2021-01-26 MED ORDER — ENALAPRILAT 1.25 MG/ML IV SOLN
0.6250 mg | Freq: Once | INTRAVENOUS | Status: AC
  Administered 2021-01-26: 0.625 mg via INTRAVENOUS
  Filled 2021-01-26: qty 2

## 2021-01-26 MED ORDER — HYDRALAZINE HCL 20 MG/ML IJ SOLN
5.0000 mg | INTRAMUSCULAR | Status: DC | PRN
  Administered 2021-01-27 – 2021-01-29 (×3): 5 mg via INTRAVENOUS
  Filled 2021-01-26 (×4): qty 1

## 2021-01-26 MED ORDER — DM-GUAIFENESIN ER 30-600 MG PO TB12
1.0000 | ORAL_TABLET | Freq: Two times a day (BID) | ORAL | Status: DC | PRN
  Administered 2021-01-27 – 2021-01-29 (×3): 1 via ORAL
  Filled 2021-01-26 (×3): qty 1

## 2021-01-26 MED ORDER — TRAZODONE HCL 50 MG PO TABS
50.0000 mg | ORAL_TABLET | Freq: Every day | ORAL | Status: DC
  Administered 2021-01-26 – 2021-01-29 (×4): 50 mg via ORAL
  Filled 2021-01-26 (×4): qty 1

## 2021-01-26 MED ORDER — TRAMADOL HCL 50 MG PO TABS: 50.0000 mg | ORAL_TABLET | Freq: Two times a day (BID) | ORAL | Status: DC | PRN

## 2021-01-26 MED ORDER — BRIMONIDINE TARTRATE 0.2 % OP SOLN
1.0000 [drp] | Freq: Two times a day (BID) | OPHTHALMIC | Status: DC
  Filled 2021-01-26: qty 5

## 2021-01-26 MED ORDER — ALPRAZOLAM 0.25 MG PO TABS
0.2500 mg | ORAL_TABLET | Freq: Two times a day (BID) | ORAL | Status: DC | PRN
  Administered 2021-01-26 – 2021-01-30 (×7): 0.25 mg via ORAL
  Filled 2021-01-26 (×7): qty 1

## 2021-01-26 MED ORDER — FUROSEMIDE 10 MG/ML IJ SOLN
40.0000 mg | Freq: Once | INTRAMUSCULAR | Status: AC
  Administered 2021-01-26: 13:00:00 40 mg via INTRAVENOUS
  Filled 2021-01-26: qty 4

## 2021-01-26 MED ORDER — IPRATROPIUM-ALBUTEROL 0.5-2.5 (3) MG/3ML IN SOLN
3.0000 mL | RESPIRATORY_TRACT | Status: DC
  Administered 2021-01-27 (×5): 3 mL via RESPIRATORY_TRACT
  Filled 2021-01-26 (×5): qty 3

## 2021-01-26 MED ORDER — SODIUM CHLORIDE 0.9 % IV SOLN
1.0000 g | Freq: Once | INTRAVENOUS | Status: AC
  Administered 2021-01-26: 17:00:00 1 g via INTRAVENOUS
  Filled 2021-01-26: qty 10

## 2021-01-26 MED ORDER — LATANOPROST 0.005 % OP SOLN
1.0000 [drp] | Freq: Every day | OPHTHALMIC | Status: DC
  Administered 2021-01-29: 1 [drp] via OPHTHALMIC
  Filled 2021-01-26 (×2): qty 2.5

## 2021-01-26 MED ORDER — TAMSULOSIN HCL 0.4 MG PO CAPS
0.4000 mg | ORAL_CAPSULE | Freq: Every day | ORAL | Status: DC
  Administered 2021-01-27 – 2021-01-30 (×4): 0.4 mg via ORAL
  Filled 2021-01-26 (×4): qty 1

## 2021-01-26 MED ORDER — ACETAMINOPHEN 325 MG PO TABS
650.0000 mg | ORAL_TABLET | Freq: Four times a day (QID) | ORAL | Status: DC | PRN
  Administered 2021-01-27 – 2021-01-28 (×3): 650 mg via ORAL
  Filled 2021-01-26 (×3): qty 2

## 2021-01-26 MED ORDER — ACETAMINOPHEN 500 MG PO TABS
1000.0000 mg | ORAL_TABLET | Freq: Once | ORAL | Status: AC
  Administered 2021-01-26: 13:00:00 1000 mg via ORAL
  Filled 2021-01-26: qty 2

## 2021-01-26 MED ORDER — NICOTINE 21 MG/24HR TD PT24
21.0000 mg | MEDICATED_PATCH | Freq: Every day | TRANSDERMAL | Status: DC
  Filled 2021-01-26 (×5): qty 1

## 2021-01-26 MED ORDER — ENSURE ENLIVE PO LIQD: 237.0000 mL | Freq: Two times a day (BID) | ORAL | Status: DC

## 2021-01-26 MED ORDER — SODIUM CHLORIDE 0.9 % IV SOLN
500.0000 mg | Freq: Once | INTRAVENOUS | Status: AC
  Administered 2021-01-26: 17:00:00 500 mg via INTRAVENOUS
  Filled 2021-01-26: qty 5

## 2021-01-26 MED ORDER — POTASSIUM CHLORIDE CRYS ER 20 MEQ PO TBCR
40.0000 meq | EXTENDED_RELEASE_TABLET | Freq: Once | ORAL | Status: AC
  Administered 2021-01-26: 17:00:00 40 meq via ORAL
  Filled 2021-01-26: qty 2

## 2021-01-26 MED ORDER — ALBUTEROL SULFATE (2.5 MG/3ML) 0.083% IN NEBU: 2.5000 mg | INHALATION_SOLUTION | RESPIRATORY_TRACT | Status: DC | PRN

## 2021-01-26 MED ORDER — LISINOPRIL 5 MG PO TABS
5.0000 mg | ORAL_TABLET | Freq: Every day | ORAL | Status: DC
  Administered 2021-01-26 – 2021-01-30 (×5): 5 mg via ORAL
  Filled 2021-01-26 (×5): qty 1

## 2021-01-26 MED ORDER — QUETIAPINE FUMARATE 25 MG PO TABS
25.0000 mg | ORAL_TABLET | Freq: Every day | ORAL | Status: DC
  Administered 2021-01-26 – 2021-01-29 (×4): 25 mg via ORAL
  Filled 2021-01-26 (×4): qty 1

## 2021-01-26 MED ORDER — FUROSEMIDE 10 MG/ML IJ SOLN
20.0000 mg | Freq: Every day | INTRAMUSCULAR | Status: DC
  Administered 2021-01-27 – 2021-01-28 (×2): 20 mg via INTRAVENOUS
  Filled 2021-01-26: qty 2
  Filled 2021-01-26: qty 4

## 2021-01-26 MED ORDER — BUSPIRONE HCL 15 MG PO TABS
30.0000 mg | ORAL_TABLET | Freq: Two times a day (BID) | ORAL | Status: DC
  Administered 2021-01-26 – 2021-01-30 (×7): 30 mg via ORAL
  Filled 2021-01-26: qty 6
  Filled 2021-01-26 (×5): qty 2
  Filled 2021-01-26: qty 6
  Filled 2021-01-26 (×3): qty 2

## 2021-01-26 NOTE — ED Provider Notes (Signed)
[facility]  ____________________________________________   Event Date/Time   First MD Initiated Contact with Patient 01/26/21 1059     (approximate)  I have reviewed the triage vital signs and the nursing notes.   HISTORY  Chief Complaint Shortness of Breath and Hemoptysis    HPI Todd Whitehead is a 75 y.o. male with past medical history of heart failure with reduced ejection fraction, ILD and severe emphysema on 3 L nasal cannula baseline who presents with hemoptysis.  Symptoms start about 3 weeks ago.  He endorses daily sputum that is bright red with occasional clots.  He also has intermittent dyspnea is having progressing shortness of breath over the last several weeks.  Family member encouraged him to come to the ER today.  He denies chest pain.  He has chronic cough no fevers or chills.  He was previously taking Lasix but then stopped taking it once his swelling went down.  He says that the Lasix makes his stomach hurt, his wife stopped taking it.  Denies any history of DVT or VTE.         Past Medical History:  Diagnosis Date   Anxiety    Arthritis    CHF (congestive heart failure) (HCC)    Colon polyp    COPD (chronic obstructive pulmonary disease) ([place])    smoker   Dysrhythmia    Glaucoma    Heart murmur    Hypertension    on  no meds   Low serum vitamin B12 11/18/2020   Melanoma ([place])    Resected from Left upper arm.    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   Hyperlipidemia 02/07/2018   Epistaxis 11/08/2017   PVD (peripheral vascular disease) with claudication ([place]) 09/28/2017   Hypertension 06/25/2017    Abdominal pain 06/25/2017   Aortic atherosclerosis ([place]) 06/25/2017   Type 2 diabetes mellitus with stage 3 chronic kidney disease, without long-term current use of insulin ([place]) 12/24/2016   Bile duct calculus with acute cholecystitis 08/17/2016   Acute cholecystitis    Personal history of tobacco use, presenting hazards to health 07/08/2016   Health care maintenance 12/19/2015   Elevated LFTs 10/10/2015   Edema of right lower extremity 10/10/2015   COPD (chronic obstructive pulmonary disease) with chronic bronchitis ([place]) 10/04/2015   Anxiety 09/11/2015   Carotid artery disease ([place]) 09/09/2015   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   CAD (coronary artery disease) 08/22/2015   Benign non-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 03/21/2015   Chronic low back pain 03/21/2015   Depression, major, in remission ([place]) 03/21/2015    Past Surgical History:  Procedure Laterality Date   BACK SURGERY  01/19/98   CARDIAC CATHETERIZATION N/A 08/19/2015   Procedure: Left Heart Cath and Coronary Angiography;  Surgeon: [name], MD;  Location: [place] [facility];  Service: Cardiovascular;  Laterality: N/A;   CHOLECYSTECTOMY N/A 08/19/2016   Procedure: LAPAROSCOPIC CHOLECYSTECTOMY WITH INTRAOPERATIVE CHOLANGIOGRAM;  Surgeon: [name], MD;  Location: [facility];  Service: General;  Laterality: N/A;   COLON SURGERY  age 60 days old   "  bowel was twisted up"   COLONOSCOPY WITH PROPOFOL N/A 03/19/2016   Procedure: COLONOSCOPY WITH PROPOFOL;  Surgeon: [name], MD;  Location: [facility];  Service: Endoscopy;  Laterality: N/A;   CORONARY ARTERY BYPASS GRAFT  2017   ERCP N/A 08/18/2016   Procedure: ENDOSCOPIC RETROGRADE CHOLANGIOPANCREATOGRAPHY (ERCP);  Surgeon: [name], MD;  Location: [place];  Service: Gastroenterology;  Laterality: N/A;   ESOPHAGOGASTRODUODENOSCOPY (EGD) WITH PROPOFOL N/A 09/18/2018   Procedure: ESOPHAGOGASTRODUODENOSCOPY (EGD) WITH PROPOFOL;   Surgeon: [name], DO;  Location: [place];  Service: General;  Laterality: N/A;   EYE SURGERY Left 03/26/11   LOWER EXTREMITY ANGIOGRAPHY Left 10/03/2017   Procedure: LOWER EXTREMITY ANGIOGRAPHY;  Surgeon: [name], MD;  Location: [place] [facility];  Service: Cardiovascular;  Laterality: Left;   LOWER EXTREMITY ANGIOGRAPHY Right 10/31/2017   Procedure: LOWER EXTREMITY ANGIOGRAPHY;  Surgeon: [name], MD;  Location: [place] [facility];  Service: Cardiovascular;  Laterality: Right;   LUMBAR LAMINECTOMY/DECOMPRESSION MICRODISCECTOMY Right 10/02/2012   Procedure: Right Lumbar Five-Sacral One Lumbar laminotomy/Microdiskectomy;  Surgeon: [name], MD;  Location: [place] [facility];  Service: Neurosurgery;  Laterality: Right;  Right Lumbar Five-Sacral One Lumbar laminotomy/Microdiskectomy   VISCERAL ANGIOGRAPHY N/A 11/08/2020   Procedure: VISCERAL ANGIOGRAPHY;  Surgeon: Katha Cabal, MD;  Location: [place] [facility];  Service: Cardiovascular;  Laterality: N/A;   VISCERAL ANGIOGRAPHY N/A 11/25/2020   Procedure: VISCERAL ANGIOGRAPHY;  Surgeon: Katha Cabal, MD;  Location: [place] [facility];  Service: Cardiovascular;  Laterality: N/A;    Prior to Admission medications   Medication Sig Start Date End Date Taking? Authorizing Provider  acetaminophen (TYLENOL) 500 MG tablet Take 1 tablet (500 mg total) by mouth every 6 (six) hours as needed. Patient taking differently: Take 500 mg by mouth every 6 (six) hours as needed for moderate pain or headache. 08/20/16   Waldron Session, MD  ALPRAZolam Duanne Moron) 0.5 MG tablet Take 0.25 mg by mouth at bedtime as needed for sleep.    [provider]  aspirin EC 81 MG tablet Take 81 mg by mouth daily. Swallow whole.    [provider]  atorvastatin (LIPITOR) 40 MG tablet Take 40 mg by mouth daily.  05/22/17   [provider]  brimonidine (ALPHAGAN) 0.2 % ophthalmic solution Place 1 drop into both eyes 2 (two) times  daily. 02/14/16   [provider]  budesonide-formoterol (SYMBICORT) 80-4.5 MCG/ACT inhaler Inhale 2 puffs into the lungs 2 (two) times daily. 12/12/20   Richarda Osmond, MD  busPIRone (BUSPAR) 30 MG tablet Take 30 mg by mouth 2 (two) times daily.    [provider]  citalopram (CELEXA) 20 MG tablet Take 20 mg by mouth daily. 10/28/19   [provider]  clopidogrel (PLAVIX) 75 MG tablet Take 1 tablet (75 mg total) by mouth daily. 11/26/20   Schnier, Dolores Lory, MD  furosemide (LASIX) 20 MG tablet Take 20 mg by mouth daily. 12/14/20   [provider]  latanoprost (XALATAN) 0.005 % ophthalmic solution Place 1 drop into both eyes at bedtime.  06/14/17   [provider]  metoprolol succinate (TOPROL-XL) 25 MG 24 hr tablet Take 12.5 mg by mouth daily.    [provider]  QUEtiapine (SEROQUEL) 25 MG tablet Take 25 mg by mouth at bedtime. 07/15/19   [provider]  tamsulosin (FLOMAX) 0.4 MG CAPS capsule Take 1 capsule (0.4 mg total) by mouth daily after breakfast. 08/20/16   Burnis Medin,  Ei[place], MD  traMADol (ULTRAM) 50 MG tablet Take 1 tablet (50 mg total) by mouth every 12 (twelve) hours as needed. 12/12/20   Richarda Osmond, MD  traZODone (DESYREL) 50 MG tablet Take 50 mg by mouth at bedtime. 06/02/19   [provider]    Allergies Other, Poison ivy extract, and Poison oak extract  Family History  Problem Relation Age of Onset   Heart attack Father    Aneurysm Brother    Lung cancer Brother     Social History Social History   Tobacco Use   Smoking status: Every Day    Packs/day: 1.50    Years: 58.00    Pack years: 87.00    Types: Cigarettes   Smokeless tobacco: Never  Vaping Use   Vaping Use: Former  Substance Use Topics   Alcohol use: No   Drug use: No    Review of Systems   Review of Systems  Constitutional:  Negative for fever.  Respiratory:  Positive for cough and shortness of breath. Negative for chest  tightness.   Gastrointestinal:  Positive for abdominal pain. Negative for nausea and vomiting.  All other systems reviewed and are negative.  Physical Exam Updated Vital Signs BP (!) 205/98 (BP Location: Left Arm)    Pulse 82    Temp (!) 97.5 F (36.4 C) (Oral)    Resp (!) 24    Ht 5\' 7"  (1.702 m)    Wt 54 kg    SpO2 97%    BMI 18.65 kg/m   Physical Exam Vitals and nursing note reviewed.  Constitutional:      General: He is not in acute distress.    Appearance: Normal appearance.  HENT:     Head: Normocephalic and atraumatic.  Eyes:     General: No scleral icterus.    Conjunctiva/sclera: Conjunctivae normal.  Pulmonary:     Effort: Pulmonary effort is normal. No respiratory distress.     Breath sounds: Normal breath sounds. No wheezing.     Comments: Patient appears mildly tachypneic, able to speak in full sentences but having to catch his breath Musculoskeletal:        General: No deformity or signs of injury.     Cervical back: Normal range of motion.     Right lower leg: Edema present.     Left lower leg: Edema present.     Comments: 2+ lower extremity edema bilaterally  Skin:    Coloration: Skin is not jaundiced or pale.  Neurological:     General: No focal deficit present.     Mental Status: He is alert and oriented to person, place, and time. Mental status is at baseline.  Psychiatric:        Mood and Affect: Mood normal.        Behavior: Behavior normal.     LABS (all labs ordered are listed, but only abnormal results are displayed)  Labs Reviewed  CBC - Abnormal; Notable for the following components:      Result Value   RBC 2.79 (*)    Hemoglobin 8.6 (*)    HCT 26.0 (*)    All other components within normal limits  COMPREHENSIVE METABOLIC PANEL - Abnormal; Notable for the following components:   Sodium 131 (*)    Potassium 3.4 (*)    Chloride 95 (*)    Glucose, Bld 166 (*)    Calcium 8.3 (*)    All other components within normal limits  BRAIN  NATRIURETIC  PEPTIDE - Abnormal; Notable for the following components:   B Natriuretic Peptide 1,567.1 (*)    All other components within normal limits  APTT - Abnormal; Notable for the following components:   aPTT 39 (*)    All other components within normal limits  TROPONIN I (HIGH SENSITIVITY) - Abnormal; Notable for the following components:   Troponin I (High Sensitivity) 34 (*)    All other components within normal limits  TROPONIN I (HIGH SENSITIVITY) - Abnormal; Notable for the following components:   Troponin I (High Sensitivity) 37 (*)    All other components within normal limits  RESP PANEL BY RT-PCR (FLU A&B, COVID) ARPGX2  PROTIME-INR   ____________________________________________  EKG  Right axis deviation, normal sinus rhythm, incomplete right bundle branch block, no acute ischemic changes ____________________________________________  RADIOLOGY Almeta Monas, personally viewed and evaluated these images (plain radiographs) as part of my medical decision making, as well as reviewing the written report by the radiologist.  ED MD interpretation: I reviewed the chest x-ray which shows a left upper lobe infiltrate    ____________________________________________   PROCEDURES  Procedure(s) performed (including Critical Care):  Procedures   ____________________________________________   INITIAL IMPRESSION / ASSESSMENT AND PLAN / ED COURSE     Patient is a 75 year old male medical history of ILD, emphysema and heart failure with reduced EF who presents with shortness of breath and hemoptysis.  He is on 3 L nasal cannula which is his baseline and is hypertensive and somewhat tachypneic.  He has had what he describes as bright red blood with coughing for about 3 weeks.  On chest x-ray he does appear to have a left upper lobe abnormality, read as radiology as infiltrate versus pulmonary hemorrhage.  He appears somewhat dyspneic on exam but does not have any focal  findings on lung exam.  He does appear somewhat volume overloaded.  His BNP and troponin are mildly elevated.  He has no active chest pain and no ischemic changes on EKG.  Hemoglobin is 8.6 which is around his baseline.  He is COVID and flu negative.  He was given dose of IV Lasix as well as Vasotec for his blood pressure.  We will obtain a CT angio to rule out pulmonary embolism and also better evaluate the lung parenchyma given hemoptysis.  He will require admission.      ____________________________________________   FINAL CLINICAL IMPRESSION(S) / ED DIAGNOSES  Final diagnoses:  Hemoptysis     ED Discharge Orders     None        Note:  This document was prepared using Dragon voice recognition software and may include unintentional dictation errors.    Rada Hay, MD 01/26/21 1425

## 2021-01-26 NOTE — ED Notes (Signed)
Patient transported to CT 

## 2021-01-26 NOTE — ED Notes (Addendum)
Dinner tray given to pt

## 2021-01-26 NOTE — ED Provider Notes (Signed)
----------------------------------------- °  3:06 PM on 01/26/2021 -----------------------------------------  Blood pressure (!) 205/98, pulse 82, temperature (!) 97.5 F (36.4 C), temperature source Oral, resp. rate (!) 24, height 5\' 7"  (1.702 m), weight 54 kg, SpO2 97 %.  Assuming care from Dr. Starleen Blue.  In short, Todd Whitehead is a 75 y.o. male with a chief complaint of Shortness of Breath and Hemoptysis .  Refer to the original H&P for additional details.  The current plan of care is to follow-up CTA chest for hemoptysis and shortness of breath.  ----------------------------------------- 4:14 PM on 01/26/2021 ----------------------------------------- CTA is negative for PE but does show evidence of bilateral pneumonia.  We will treat for community-acquired pneumonia with Rocephin and azithromycin, patient continues to breathe comfortably on 3 L nasal cannula.  Plan to discuss with hospitalist for admission.    Blake Divine, MD 01/26/21 [phone]

## 2021-01-26 NOTE — H&P (Signed)
History and Physical    Todd Whitehead WUX:[id number] DOB: [birth year] DOA: 01/26/2021  Referring MD/NP/PA:   PCP: [name], MD   Patient coming from:  The patient is coming from home.  At baseline, pt is independent for most of ADL.        Chief Complaint: Shortness breath, cough, hemoptysis  HPI: Todd Whitehead is a 75 y.o. male with medical history significant of hypertension, hyperlipidemia, COPD on 3 L oxygen, depression with anxiety, BPH, PVD, CAD, CABG, stent placement, CHF with EF of 45-50%, tobacco abuse, anemia, carotid artery stenosis.  Patient states that he has cough and shortness of breath for more than 3 weeks, which has worsened in the past several days.  He coughs up little mucus, but sometimes with dark blood.  Denies chest pain, no fever or chills.  Patient also reports worsening bilateral leg edema.  No nausea, vomiting, diarrhea or abdominal pain.  Patient denies history of diabetes.  ED Course: pt was found to have BNP 1567, WBC 6.3, INR 1.1, PTT 39, troponin level 34, 37, negative COVID PCR, potassium 3.4, GFR > 60, temperature normal, blood pressure 187/84, heart rate 84, RR 26, oxygen saturation 93% on home level for 3 days oxygen.  Patient is admitted to progressive bed as inpatient  Chest x-ray: Heterogeneous opacities of the left upper lung, possibly due to infection or edema, pulmonary hemorrhage is also a consideration given history of hemoptysis. Recommend follow-up chest x-ray in 6-8 weeks to ensure resolution.  CTA of chest:  1. Negative for acute pulmonary embolus. 2. Emphysema. New ground-glass density in the lingula and left upper lobe with mild ground-glass density in the right lower lobe, findings are suspicious for bilateral pneumonia. 3. 10 mm slightly spiculated appearing nodule at the right lung base. Consider one of the following in 3 months for both low-risk and high-risk individuals: (a) repeat chest CT, (b)  follow-up PET-CT, or (c) tissue sampling. This recommendation follows the consensus statement: Guidelines for Management of Incidental Pulmonary Nodules Detected on CT Images: From the Fleischner Society 2017; Radiology 2017; 284:228-243.  Aortic Atherosclerosis (ICD10-I70.0) and Emphysema (ICD10-J43.9).   Review of Systems:   General: no fevers, chills, no body weight gain, has poor appetite, has fatigue HEENT: no blurry vision, hearing changes or sore throat Respiratory: has dyspnea, coughing, no wheezing CV: no chest pain, no palpitations GI: no nausea, vomiting, abdominal pain, diarrhea, constipation GU: no dysuria, burning on urination, increased urinary frequency, hematuria  Ext: has leg edema Neuro: no unilateral weakness, numbness, or tingling, no vision change or hearing loss Skin: no rash, no skin tear. MSK: No muscle spasm, no deformity, no limitation of range of movement in spin Heme: No easy bruising.  Travel history: No recent long distant travel.  Allergy:  Allergies  Allergen Reactions   Other Nausea And Vomiting    Cheese,butter,sour cream,cottage cheese   Poison Ivy Extract Rash   Poison Oak Extract Rash    Past Medical History:  Diagnosis Date   Anxiety    Arthritis    CHF (congestive heart failure) (HCC)    Colon polyp    COPD (chronic obstructive pulmonary disease) ([place])    smoker   Dysrhythmia    Glaucoma    Heart murmur    Hypertension    on  no meds   Low serum vitamin B12 11/18/2020   Melanoma ([place])    Resected from Left upper arm.    Myocardial infarction [facility]) 2017  Peripheral vascular disease [facility])    Prostate enlargement     Past Surgical History:  Procedure Laterality Date   BACK SURGERY  01/19/98   CARDIAC CATHETERIZATION N/A 08/19/2015   Procedure: Left Heart Cath and Coronary Angiography;  Surgeon: [name], MD;  Location: [place] CV [facility];  Service: Cardiovascular;  Laterality: N/A;   CHOLECYSTECTOMY N/A 08/19/2016    Procedure: LAPAROSCOPIC CHOLECYSTECTOMY WITH INTRAOPERATIVE CHOLANGIOGRAM;  Surgeon: [name], MD;  Location: [place];  Service: General;  Laterality: N/A;   COLON SURGERY  age 34 days old   " bowel was twisted up"   COLONOSCOPY WITH PROPOFOL N/A 03/19/2016   Procedure: COLONOSCOPY WITH PROPOFOL;  Surgeon: [name], MD;  Location[facility] ENDOSCOPY;  Service: Endoscopy;  Laterality: N/A;   CORONARY ARTERY BYPASS GRAFT  2017   ERCP N/A 08/18/2016   Procedure: ENDOSCOPIC RETROGRADE CHOLANGIOPANCREATOGRAPHY (ERCP);  Surgeon: [name], MD;  Location: [place];  Service: Gastroenterology;  Laterality: N/A;   ESOPHAGOGASTRODUODENOSCOPY (EGD) WITH PROPOFOL N/A 09/18/2018   Procedure: ESOPHAGOGASTRODUODENOSCOPY (EGD) WITH PROPOFOL;  Surgeon: [name], DO;  Location: [place];  Service: General;  Laterality: N/A;   EYE SURGERY Left 03/26/11   LOWER EXTREMITY ANGIOGRAPHY Left 10/03/2017   Procedure: LOWER EXTREMITY ANGIOGRAPHY;  Surgeon: [name], MD;  Location: [place] [facility];  Service: Cardiovascular;  Laterality: Left;   LOWER EXTREMITY ANGIOGRAPHY Right 10/31/2017   Procedure: LOWER EXTREMITY ANGIOGRAPHY;  Surgeon: [name], MD;  Location: [place] [facility];  Service: Cardiovascular;  Laterality: Right;   LUMBAR LAMINECTOMY/DECOMPRESSION MICRODISCECTOMY Right 10/02/2012   Procedure: Right Lumbar Five-Sacral One Lumbar laminotomy/Microdiskectomy;  Surgeon: [name], MD;  Location: [place] NEURO ORS;  Service: Neurosurgery;  Laterality: Right;  Right Lumbar Five-Sacral One Lumbar laminotomy/Microdiskectomy   VISCERAL ANGIOGRAPHY N/A 11/08/2020   Procedure: VISCERAL ANGIOGRAPHY;  Surgeon: Katha Cabal, MD;  Location: [place] [facility];  Service: Cardiovascular;  Laterality: N/A;   VISCERAL ANGIOGRAPHY N/A 11/25/2020   Procedure: VISCERAL ANGIOGRAPHY;  Surgeon: Katha Cabal, MD;  Location: [place] [facility];  Service: Cardiovascular;  Laterality:  N/A;    Social History:  reports that he has been smoking cigarettes. He has a 87.00 pack-year smoking history. He has never used smokeless tobacco. He reports that he does not drink alcohol and does not use drugs.  Family History:  Family History  Problem Relation Age of Onset   Heart attack Father    Aneurysm Brother    Lung cancer Brother      Prior to Admission medications   Medication Sig Start Date End Date Taking? Authorizing Provider  acetaminophen (TYLENOL) 500 MG tablet Take 1 tablet (500 mg total) by mouth every 6 (six) hours as needed. Patient taking differently: Take 500 mg by mouth every 6 (six) hours as needed for moderate pain or headache. 08/20/16   Waldron Session, MD  ALPRAZolam Duanne Moron) 0.5 MG tablet Take 0.25 mg by mouth at bedtime as needed for sleep.    [provider]  aspirin EC 81 MG tablet Take 81 mg by mouth daily. Swallow whole.    [provider]  atorvastatin (LIPITOR) 40 MG tablet Take 40 mg by mouth daily.  05/22/17   [provider]  brimonidine (ALPHAGAN) 0.2 % ophthalmic solution Place 1 drop into both eyes 2 (two) times daily. 02/14/16   [provider]  budesonide-formoterol (SYMBICORT) 80-4.5 MCG/ACT inhaler Inhale 2 puffs into the lungs 2 (two) times daily. 12/12/20  Richarda Osmond, MD  busPIRone (BUSPAR) 30 MG tablet Take 30 mg by mouth 2 (two) times daily.    [provider]  citalopram (CELEXA) 20 MG tablet Take 20 mg by mouth daily. 10/28/19   [provider]  clopidogrel (PLAVIX) 75 MG tablet Take 1 tablet (75 mg total) by mouth daily. 11/26/20   Schnier, Dolores Lory, MD  furosemide (LASIX) 20 MG tablet Take 20 mg by mouth daily. 12/14/20   [provider]  latanoprost (XALATAN) 0.005 % ophthalmic solution Place 1 drop into both eyes at bedtime.  06/14/17   [provider]  metoprolol succinate (TOPROL-XL) 25 MG 24 hr tablet Take 12.5 mg by mouth daily.    [provider]   QUEtiapine (SEROQUEL) 25 MG tablet Take 25 mg by mouth at bedtime. 07/15/19   [provider]  tamsulosin (FLOMAX) 0.4 MG CAPS capsule Take 1 capsule (0.4 mg total) by mouth daily after breakfast. 08/20/16   Waldron Session, MD  traMADol (ULTRAM) 50 MG tablet Take 1 tablet (50 mg total) by mouth every 12 (twelve) hours as needed. 12/12/20   Richarda Osmond, MD  traZODone (DESYREL) 50 MG tablet Take 50 mg by mouth at bedtime. 06/02/19   [provider]    Physical Exam: Vitals:   01/26/21 1301 01/26/21 1346 01/26/21 1510 01/26/21 1726  BP: (!) 208/96 (!) 205/98 (!) 187/84 (!) 178/82  Pulse: 84 82 77 72  Resp: (!) 24 (!) 24 (!) 26 (!) 22  Temp:      TempSrc:      SpO2: 99% 97% 97% 97%  Weight:      Height:       General: Not in acute distress.  Thin body habitus HEENT:       Eyes: PERRL, EOMI, no scleral icterus.       ENT: No discharge from the ears and nose, no pharynx injection, no tonsillar enlargement.        Neck: Positive JVD, no bruit, no mass felt. Heme: No neck lymph node enlargement. Cardiac: S1/S2, RRR, No murmurs, No gallops or rubs. Respiratory: Coarse breathing sound bilaterally GI: Soft, nondistended, nontender, no rebound pain, no organomegaly, BS present. GU: No hematuria Ext: 1+ pitting leg edema bilaterally. 1+DP/PT pulse bilaterally. Musculoskeletal: No joint deformities, No joint redness or warmth, no limitation of ROM in spin. Skin: No rashes.  Neuro: Alert, oriented X3, cranial nerves II-XII grossly intact, moves all extremities normally. Psych: Patient is not psychotic, no suicidal or hemocidal ideation.  Labs on Admission: I have personally reviewed following labs and imaging studies  CBC: Recent Labs  Lab 01/26/21 0935  WBC 6.3  HGB 8.6*  HCT 26.0*  MCV 93.2  PLT 644   Basic Metabolic Panel: Recent Labs  Lab 01/26/21 0935  NA 131*  K 3.4*  CL 95*  CO2 29  GLUCOSE 166*  BUN 14  CREATININE 0.82  CALCIUM 8.3*    GFR: Estimated Creatinine Clearance: 59.5 mL/min (by C-G formula based on SCr of 0.82 mg/dL). Liver Function Tests: Recent Labs  Lab 01/26/21 0935  AST 37  ALT 33  ALKPHOS 121  BILITOT 0.6  PROT 6.7  ALBUMIN 3.5   No results for input(s): LIPASE, AMYLASE in the last 168 hours. No results for input(s): AMMONIA in the last 168 hours. Coagulation Profile: Recent Labs  Lab 01/26/21 0935  INR 1.1   Cardiac Enzymes: No results for input(s): CKTOTAL, CKMB, CKMBINDEX, TROPONINI in the last 168 hours. BNP (  last 3 results) No results for input(s): PROBNP in the last 8760 hours. HbA1C: No results for input(s): HGBA1C in the last 72 hours. CBG: No results for input(s): GLUCAP in the last 168 hours. Lipid Profile: No results for input(s): CHOL, HDL, LDLCALC, TRIG, CHOLHDL, LDLDIRECT in the last 72 hours. Thyroid Function Tests: No results for input(s): TSH, T4TOTAL, FREET4, T3FREE, THYROIDAB in the last 72 hours. Anemia Panel: No results for input(s): VITAMINB12, FOLATE, FERRITIN, TIBC, IRON, RETICCTPCT in the last 72 hours. Urine analysis:    Component Value Date/Time   COLORURINE YELLOW (A) 08/17/2016 0533   APPEARANCEUR Clear 12/07/2019 1028   LABSPEC 1.009 08/17/2016 0533   PHURINE 6.0 08/17/2016 0533   GLUCOSEU Negative 12/07/2019 1028   HGBUR MODERATE (A) 08/17/2016 0533   BILIRUBINUR Negative 12/07/2019 1028   KETONESUR NEGATIVE 08/17/2016 0533   PROTEINUR 2+ (A) 12/07/2019 1028   PROTEINUR 30 (A) 08/17/2016 0533   NITRITE Negative 12/07/2019 1028   NITRITE NEGATIVE 08/17/2016 0533   LEUKOCYTESUR Negative 12/07/2019 1028   Sepsis Labs: @LABRCNTIP (procalcitonin:4,lacticidven:4) ) Recent Results (from the past 240 hour(s))  Resp Panel by RT-PCR (Flu A&B, Covid) Nasopharyngeal Swab     Status: None   Collection Time: 01/26/21 11:56 AM   Specimen: Nasopharyngeal Swab; Nasopharyngeal(NP) swabs in vial transport medium  Result Value Ref Range Status   SARS  Coronavirus 2 by RT PCR NEGATIVE NEGATIVE Final    Comment: (NOTE) SARS-CoV-2 target nucleic acids are NOT DETECTED.  The SARS-CoV-2 RNA is generally detectable in upper respiratory specimens during the acute phase of infection. The lowest concentration of SARS-CoV-2 viral copies this assay can detect is 138 copies/mL. A negative result does not preclude SARS-Cov-2 infection and should not be used as the sole basis for treatment or other patient management decisions. A negative result may occur with  improper specimen collection/handling, submission of specimen other than nasopharyngeal swab, presence of viral mutation(s) within the areas targeted by this assay, and inadequate number of viral copies(<138 copies/mL). A negative result must be combined with clinical observations, patient history, and epidemiological information. The expected result is Negative.  Fact Sheet for Patients:  EntrepreneurPulse.com.au  Fact Sheet for Healthcare Providers:  IncredibleEmployment.be  This test is no t yet approved or cleared by the [place] FDA and  has been authorized for detection and/or diagnosis of SARS-CoV-2 by FDA under an Emergency Use Authorization (EUA). This EUA will remain  in effect (meaning this test can be used) for the duration of the COVID-19 declaration under Section 564(b)(1) of the Act, 21 U.S.C.section 360bbb-3(b)(1), unless the authorization is terminated  or revoked sooner.       Influenza A by PCR NEGATIVE NEGATIVE Final   Influenza B by PCR NEGATIVE NEGATIVE Final    Comment: (NOTE) The Xpert Xpress SARS-CoV-2/FLU/RSV plus assay is intended as an aid in the diagnosis of influenza from Nasopharyngeal swab specimens and should not be used as a sole basis for treatment. Nasal washings and aspirates are unacceptable for Xpert Xpress SARS-CoV-2/FLU/RSV testing.  Fact Sheet for  Patients: EntrepreneurPulse.com.au  Fact Sheet for Healthcare Providers: IncredibleEmployment.be  This test is not yet approved or cleared by the [place] FDA and has been authorized for detection and/or diagnosis of SARS-CoV-2 by FDA under an Emergency Use Authorization (EUA). This EUA will remain in effect (meaning this test can be used) for the duration of the COVID-19 declaration under Section 564(b)(1) of the Act, 21 U.S.C. section 360bbb-3(b)(1), unless the authorization is terminated or revoked.  Performed at [facility], [address] [place], [place] [address]      Radiological Exams on Admission: DG Chest 2 View  Result Date: 01/26/2021 CLINICAL DATA:  Shortness of breath EXAM: CHEST - 2 VIEW COMPARISON:  Chest x-ray dated December 09, 2020 FINDINGS: Cardiac and mediastinal contours are unchanged status post CABG. Heterogeneous opacities of the left upper lung. No large pleural effusion or pneumothorax. IMPRESSION: Heterogeneous opacities of the left upper lung, possibly due to infection or edema, pulmonary hemorrhage is also a consideration given history of hemoptysis. Recommend follow-up chest x-ray in 6-8 weeks to ensure resolution. Electronically Signed   By: Yetta Glassman M.D.   On: 01/26/2021 10:12     EKG: I have personally reviewed.  Sinus rhythm, QTC 502, RAD, low voltage, poor R wave progression, right bundle blockage  Assessment/Plan Principal Problem:   Acute on chronic systolic CHF (congestive heart failure) (HCC) Active Problems:   Hypertension   Depression with anxiety   CAD (coronary artery disease)   Hypertensive urgency   HLD (hyperlipidemia)   COPD (chronic obstructive pulmonary disease) (HCC)   Tobacco abuse   Hemoptysis   Hypokalemia   Lung nodule   Elevated troponin   Normocytic anemia   Protein-calorie malnutrition, severe (HCC)   CAP (community acquired pneumonia)   Acute on  chronic systolic CHF (congestive heart failure) ([place]): 2D echo 12/10/2020 showed EF of 45-50%.  Patient has 1+ leg edema, elevated BNP 1567, positive JVD, clinically consistent with CHF exacerbation.  -Will admit to progressive unit as inpatient -Lasix 40 mg daily by IV -2d echo -Daily weights -strict I/O's -Low salt diet -Fluid restriction -Obtain REDs Vest reading  Hemoptysis and possible CAP (community acquired pneumonia): CT angiograms negative for PE, showed groundglass infiltrates. Though patient does not have fever and leukocytosis, he has hemoptysis.  Antibiotics was started in ED, will continue.  Will check procalcitonin.  If patient does not develop fever or leukocytosis and clinically improving with treatment of CHF, may discontinue antibiotics. - IV Rocephin and azithromycin - Mucinex for cough  - Bronchodilators - Urine legionella and S. pneumococcal antigen - Follow up blood culture x2, sputum culture  - will get Procalcitonin  - Hold aspirin and Plavix  Hypertension -Hydralazine as needed -Metoprolol -Patient is also on IV Lasix -Will start lisinopril 5 mg daily  Depression with anxiety -Continue home medications  CAD (coronary artery disease) and elevated troponin: Troponin level 34, 37, denies chest pain, most likely due to demand ischemia -Trend troponin -Aspirin and Plavix -Lipitor -Check A1c, FLP  Hypertensive urgency and a history of hypertension: Blood pressure was up to 208/96 -Patient also given 1 dose of enalaprilate 0.625 in Ed -IV hydralazine as needed -Continue metoprolol -Patient is on IV Lasix -Start lisinopril 5 mg daily  HLD (hyperlipidemia) -Lipitor  COPD (chronic obstructive pulmonary disease) (HCC) -Bronchodilators  Tobacco abuse -Nicotine patch  Hypokalemia: Potassium 3.4 -Repleted potassium -Check magnesium level  Lung nodule -Follow-up with PCP  Normocytic anemia: Hemoglobin stable, 8.6 (7.9 on 12/10/2020) -Follow-up  with CBC  Protein-calorie malnutrition, severe ([place]): BMI 18.65 -Start Ensure and nutrition consult       DVT ppx: SCD Code Status: DNR per pt and his granddaughter Family Communication: Yes, patient's granddaughter  at bed side.    Disposition Plan:  Anticipate discharge back to previous environment Consults called:  none Admission status and Level of care: Progressive:     as inpt       Status is: Inpatient  Remains inpatient appropriate because: Patient has multiple comorbidities, now presents with CHF exacerbation, hemoptysis, possible community-acquired pneumonia, also has elevated troponin.  His presentation is highly complicated with the patient at high risk of deteriorating.  Will need to be treated in hospital for at least 2 days.           Date of Service 01/26/2021    Ivor Costa [facility]   If 7PM-7AM, please contact night-coverage [email or web address] 01/26/2021, 5:38 PM

## 2021-01-26 NOTE — ED Triage Notes (Addendum)
Pt comes into the ED via POV c/o SHOB and coughing up blood that started 3 weeks ago.  Pt was found by Nature conservation officer in our parking lot on his knees due to being so [facility].  Pt using accessory muscles to breath at this time and present labored in breathing.  Pt initially presented as though he had aspirated something and was trying to catch his breath.  Pt able to slow breathing some in triage at this time.  Pt had a stent placed a month ago.  Pt also has COPD and CHF.  Pt admits to increased swelling in lower legs and that he stopped taking his fluid pills.

## 2021-01-26 NOTE — ED Notes (Signed)
Pt resting in bed. Appears to be sleeping at this time. Chest is rising and falling symmetrically. No acute distress noted. Will continue to monitor. Granddaughter remains at bedside.

## 2021-01-26 NOTE — ED Notes (Signed)
Patient reports his stool was black during BM at this time

## 2021-01-26 NOTE — ED Notes (Signed)
Patients oxygen tank changed

## 2021-01-27 ENCOUNTER — Encounter: Payer: Self-pay | Admitting: Internal Medicine

## 2021-01-27 ENCOUNTER — Inpatient Hospital Stay
Admit: 2021-01-27 | Discharge: 2021-01-27 | Disposition: A | Discharge status: Home / Self Care | Payer: Medicare Other | Attending: Internal Medicine | Admitting: Internal Medicine

## 2021-01-27 DIAGNOSIS — J189 Pneumonia, unspecified organism: Secondary | ICD-10-CM

## 2021-01-27 LAB — ECHOCARDIOGRAM COMPLETE
AR max vel: 3.46 cm2
AV Area VTI: 3.24 cm2
AV Area mean vel: 3.46 cm2
AV Mean grad: 2 mmHg
AV Peak grad: 4.5 mmHg
Ao pk vel: 1.06 m/s
Area-P 1/2: 3.74 cm2
Height: 67 in
MV VTI: 2.33 cm2
S' Lateral: 2.97 cm
Single Plane A4C EF: 48.2 %
Weight: 1904.77 oz

## 2021-01-27 LAB — LIPID PANEL
Cholesterol: 98 mg/dL (ref 0–200)
HDL: 61 mg/dL (ref >40)
LDL Cholesterol: 29 mg/dL (ref 0–99)
Total CHOL/HDL Ratio: 1.6 RATIO
Triglycerides: 41 mg/dL (ref <150)
VLDL: 8 mg/dL (ref 0–40)

## 2021-01-27 LAB — BASIC METABOLIC PANEL
Anion gap: 9 (ref 5–15)
BUN: 14 mg/dL (ref 8–23)
CO2: 33 mmol/L — ABNORMAL HIGH (ref 22–32)
Calcium: 8.6 mg/dL — ABNORMAL LOW (ref 8.9–10.3)
Chloride: 94 mmol/L — ABNORMAL LOW (ref 98–111)
Creatinine, Ser: 0.83 mg/dL (ref 0.61–1.24)
GFR, Estimated: >60 mL/min (ref >60)
Glucose, Bld: 111 mg/dL — ABNORMAL HIGH (ref 70–99)
Potassium: 3.6 mmol/L (ref 3.5–5.1)
Sodium: 136 mmol/L (ref 135–145)

## 2021-01-27 LAB — EXPECTORATED SPUTUM ASSESSMENT W GRAM STAIN, RFLX TO RESP C

## 2021-01-27 LAB — STREP PNEUMONIAE URINARY ANTIGEN: Strep Pneumo Urinary Antigen: NEGATIVE

## 2021-01-27 LAB — CBG MONITORING, ED: Glucose-Capillary: 125 mg/dL — ABNORMAL HIGH (ref 70–99)

## 2021-01-27 LAB — MAGNESIUM: Magnesium: 2 mg/dL (ref 1.7–2.4)

## 2021-01-27 MED ORDER — POTASSIUM CHLORIDE CRYS ER 20 MEQ PO TBCR
40.0000 meq | EXTENDED_RELEASE_TABLET | Freq: Once | ORAL | Status: AC
  Administered 2021-01-27: 14:00:00 40 meq via ORAL
  Filled 2021-01-27: qty 2

## 2021-01-27 MED ORDER — ADULT MULTIVITAMIN W/MINERALS CH
1.0000 | ORAL_TABLET | Freq: Every day | ORAL | Status: DC
  Administered 2021-01-27 – 2021-01-30 (×4): 1 via ORAL
  Filled 2021-01-27 (×4): qty 1

## 2021-01-27 NOTE — ED Notes (Signed)
A&Ox4. Skin p/w/d. RR labored. O2 per [place] at baseline Lpm at home. Sinus Rhythm. Afebrile. No complaints of pain. Report and cares to Inpatient RN. Marita Snellen.

## 2021-01-27 NOTE — ED Notes (Signed)
Pt. Sleeping in upright position. Chest rise and fall. NAD.

## 2021-01-27 NOTE — Evaluation (Signed)
Physical Therapy Evaluation Patient Details Name: Todd Whitehead MRN: [id number] DOB: Jan 10, 1946 Today's Date: 01/27/2021  History of Present Illness  Pt is a 75 y.o. male with medical history significant of hypertension, hyperlipidemia, COPD on 3 L oxygen, depression with anxiety, BPH, PVD, CAD, CABG, stent placement, CHF with EF of 45-50%, tobacco abuse, anemia, carotid artery stenosis.   Clinical Impression  Pt A&Ox4, reported 8/10 for headache. He stated at baseline he is independent at baseline, denied any recent falls. He demonstrated bed mobility independently, as well as sit <> Stand transfers independently from EOB and low commode. Supervision to ambulate ~51ft in room. Pt did have two small LOB, but able to self correct. Supervision to void on commode, due to lines/leads. Pt reported feeling mildly SOB, HR and spO2 (greater >90% throughout on O2). Dyspnea noted. Recommendation is HHPT with intermittent supervision. Pt and PT discussed potential benefit of follow up therapies/discharge planning to maximize pt safety, balance, and activity tolerance, currently declining, CSW informed.        Recommendations for follow up therapy are one component of a multi-disciplinary discharge planning process, led by the attending physician.  Recommendations may be updated based on patient status, additional functional criteria and insurance authorization.  Follow Up Recommendations Home health PT    Assistance Recommended at Discharge Intermittent Supervision/Assistance  Functional Status Assessment Patient has had a recent decline in their functional status and demonstrates the ability to make significant improvements in function in a reasonable and predictable amount of time.  Equipment Recommendations  None recommended by PT    Recommendations for Other Services       Precautions / Restrictions Precautions Precautions: Fall Restrictions Weight Bearing Restrictions: No      Mobility   Bed Mobility Overal bed mobility: Independent                  Transfers Overall transfer level: Independent                      Ambulation/Gait Ambulation/Gait assistance: Supervision Gait Distance (Feet): 30 Feet Assistive device: None         General Gait Details: supervision for lines/leads, 1-2 small LOB, pt able to self correct  Stairs            Wheelchair Mobility    Modified Rankin (Stroke Patients Only)       Balance Overall balance assessment: Needs assistance Sitting-balance support: Feet supported Sitting balance-Leahy Scale: Normal       Standing balance-Leahy Scale: Good Standing balance comment: 1-2 small LOB but able to self correct                             Pertinent Vitals/Pain Pain Assessment: 0-10 Pain Score: 8  Pain Location: head Pain Descriptors / Indicators: Aching Pain Intervention(s): Limited activity within patient's tolerance;Monitored during session;Repositioned;Patient requesting pain meds-RN notified    Home Living Family/patient expects to be discharged to:: Private residence Living Arrangements: Other (Comment) (unsure if niece is coming back) Available Help at Discharge: Family;Friend(s);Available PRN/intermittently Type of Home: House Home Access: Stairs to enter Entrance Stairs-Rails: Right Entrance Stairs-Number of Steps: 4, and then 2 (none)   Home Layout: One level Home Equipment: None (does potentially have access to equipment if needed via ex wife) Additional Comments: no falls    Prior Function               Mobility Comments:  walked with no AD ADLs Comments: independent with ADLs and cooking. Neice assisted with cleaning     Hand Dominance   Dominant Hand: Right    Extremity/Trunk Assessment   Upper Extremity Assessment Upper Extremity Assessment: Overall WFL for tasks assessed    Lower Extremity Assessment Lower Extremity Assessment: Overall WFL for tasks  assessed    Cervical / Trunk Assessment Cervical / Trunk Assessment: Kyphotic  Communication   Communication: No difficulties  Cognition Arousal/Alertness: Awake/alert Behavior During Therapy: WFL for tasks assessed/performed Overall Cognitive Status: Within Functional Limits for tasks assessed                                          General Comments      Exercises Other Exercises Other Exercises: pt able to stand and void, as well as sit and attempt for BM, supervision   Assessment/Plan    PT Assessment Patient needs continued PT services  PT Problem List Decreased mobility;Decreased activity tolerance;Decreased balance       PT Treatment Interventions Therapeutic exercise;Gait training;Functional mobility training;Therapeutic activities;Patient/family education;Balance training;Stair training;Neuromuscular re-education    PT Goals (Current goals can be found in the Care Plan section)  Acute Rehab PT Goals Patient Stated Goal: to go home PT Goal Formulation: With patient Time For Goal Achievement: 02/10/21 Potential to Achieve Goals: Fair    Frequency Min 2X/week   Barriers to discharge        Co-evaluation               AM-PAC PT "6 Clicks" Mobility  Outcome Measure Help needed turning from your back to your side while in a flat bed without using bedrails?: None Help needed moving from lying on your back to sitting on the side of a flat bed without using bedrails?: None Help needed moving to and from a bed to a chair (including a wheelchair)?: None Help needed standing up from a chair using your arms (e.g., wheelchair or bedside chair)?: None Help needed to walk in hospital room?: None Help needed climbing 3-5 steps with a railing? : None 6 Click Score: 24    End of Session Equipment Utilized During Treatment: Gait belt Activity Tolerance: Patient tolerated treatment well Patient left: with call bell/phone within reach;in bed Nurse  Communication: Mobility status PT Visit Diagnosis: Other abnormalities of gait and mobility (R26.89);Difficulty in walking, not elsewhere classified (R26.2);Muscle weakness (generalized) (M62.81)    Time: 1601-0932 PT Time Calculation (min) (ACUTE ONLY): 23 min   Charges:   PT Evaluation $PT Eval Low Complexity: 1 Low PT Treatments $Therapeutic Activity: 8-22 mins       Lieutenant Diego PT, DPT 10:05 AM,01/27/21

## 2021-01-27 NOTE — Progress Notes (Signed)
OT Cancellation Note  Patient Details Name: Todd Whitehead MRN: [id number] DOB: May 28, 1945   Cancelled Treatment:    Reason Eval/Treat Not Completed: OT screened, no needs identified, will sign off. Order received, chart reviewed. Per conversation with PT, pt near baseline functional independence for mobility and ADLs. No skilled OT needs identified. Will sign off. Please re-consult if additional needs arise.   Dessie Coma, M.S. OTR/L  01/27/21, 12:41 PM  ascom [phone]

## 2021-01-27 NOTE — Progress Notes (Signed)
Initial Nutrition Assessment  DOCUMENTATION CODES:   Not applicable  INTERVENTION:   -Continue Ensure Enlive po BID, each supplement provides 350 kcal and 20 grams of protein  -Continue MVI with minerals daily  NUTRITION DIAGNOSIS:   Increased nutrient needs related to chronic illness (CHF) as evidenced by estimated needs.  GOAL:   Patient will meet greater than or equal to 90% of their needs  MONITOR:   PO intake, Supplement acceptance, Labs, Weight trends, Skin, I & O's  REASON FOR ASSESSMENT:   Consult Assessment of nutrition requirement/status  ASSESSMENT:   Todd Whitehead is a 75 y.o. male with medical history significant of hypertension, hyperlipidemia, COPD on 3 L oxygen, depression with anxiety, BPH, PVD, CAD, CABG, stent placement, CHF with EF of 45-50%, tobacco abuse, anemia, carotid artery stenosis.  Pt admitted with CHF and CAP.   Reviewed I/O's: -350 ml x 24 hours  UOP: 600 ml x 24 hours  Pt unavailable at time of visit. RD unable to obtain further nutrition-related history or complete nutrition-focused physical exam at this time.     Pt currently on a 2 gram sodium diet. No meal completion data available to assess at this time.   Reviewed wt hx; pt has experienced a 1.6% wt loss over the past 3 months.   Pt with increased nutritional needs for chronic illness and would benefit from addition of oral nutrition supplements; agree with Ensure order. Pt with at risk for malnutrition; noted pt was identified with malnutrition on prior admit in 2018.   Medications reviewed and include lasix.   Lab Results  Component Value Date   HGBA1C 5.4 01/26/2021   PTA DM medications are none.   Labs reviewed: Na: 131, K: 3.4, CBGS: 125 (inpatient orders for glycemic control are none).    Diet Order:   Diet Order             Diet 2 gram sodium Room service appropriate? Yes; Fluid consistency: Thin  Diet effective now                   EDUCATION NEEDS:    No education needs have been identified at this time  Skin:  Skin Assessment: Reviewed RN Assessment  Last BM:  01/26/21  Height:   Ht Readings from Last 1 Encounters:  01/26/21 5\' 7"  (1.702 m)    Weight:   Wt Readings from Last 1 Encounters:  01/26/21 54 kg    Ideal Body Weight:  61.4 kg  BMI:  Body mass index is 18.65 kg/m.  Estimated Nutritional Needs:   Kcal:  1650-1850  Protein:  80-95 grams  Fluid:  > 1.6 L    Loistine Chance, RD, LDN, [place] Registered Dietitian II Certified Diabetes Care and Education Specialist Please refer to [facility] for RD and/or RD on-call/weekend/after hours pager

## 2021-01-27 NOTE — ED Notes (Signed)
Assisted pt to toilet 

## 2021-01-27 NOTE — Progress Notes (Addendum)
Progress Note    Buck Mcaffee  BTD:[id number] DOB: Jul 27, 1945  DOA: 01/26/2021 PCP: [name], MD      Brief Narrative:    Medical records reviewed and are as summarized below:  Todd Whitehead is a 75 y.o. male with medical history significant of hypertension, hyperlipidemia, COPD on 3 L oxygen, depression with anxiety, BPH, PVD, CAD, CABG, stent placement, CHF with EF of 45-50%, tobacco abuse, anemia, carotid artery stenosis.  He presented to the hospital with cough, shortness of breath.  Cough is sometimes productive of bloody phlegm.      Assessment/Plan:   Principal Problem:   Acute on chronic systolic CHF (congestive heart failure) (HCC) Active Problems:   Hypertension   Depression with anxiety   CAD (coronary artery disease)   Hypertensive urgency   HLD (hyperlipidemia)   COPD (chronic obstructive pulmonary disease) (HCC)   Tobacco abuse   Hemoptysis   Hypokalemia   Lung nodule   Elevated troponin   Normocytic anemia   Protein-calorie malnutrition, severe (HCC)   CAP (community acquired pneumonia)   Nutrition Problem: Increased nutrient needs Etiology: chronic illness (CHF)  Signs/Symptoms: estimated needs   Body mass index is 18.65 kg/m.  Community-acquired bilateral pneumonia, hemoptysis: Continue empiric IV antibiotics.  Follow-up blood cultures.  CTA was negative for pulmonary embolism  Acute on chronic systolic CHF: Continue IV Lasix.  Monitor BMP, daily weight and urine output.  Plan to change to oral Lasix tomorrow.  Repeat 2D echo is pending.  Chronic hypoxemic respiratory failure: Continue with 3 L/min  oxygen via nasal cannula.  COPD: Compensated.  Continue bronchodilators  Hypertensive urgency: BP is uncontrolled.  Continue antihypertensives  Hyponatremia and hypokalemia: Improved.  Continue potassium repletion.  10 mm right lower lobe nodule: Outpatient follow-up with repeat CT chest and PCP strongly  recommended  Tobacco use disorder: Counseled to quit smoking cigarettes.  Other comorbidities include CAD with coronary stent, hyperlipidemia, anemia of chronic disease, depression, anxiety    Diet Order             Diet 2 gram sodium Room service appropriate? Yes; Fluid consistency: Thin  Diet effective now                      Consultants: None  Procedures: None    Medications:    atorvastatin  40 mg Oral Daily   brimonidine  1 drop Both Eyes BID   busPIRone  30 mg Oral BID   feeding supplement  237 mL Oral BID BM   furosemide  20 mg Intravenous Daily   ipratropium-albuterol  3 mL Nebulization Q4H   latanoprost  1 drop Both Eyes QHS   lisinopril  5 mg Oral Daily   metoprolol succinate  12.5 mg Oral Daily   mometasone-formoterol  2 puff Inhalation BID   multivitamin with minerals  1 tablet Oral Daily   nicotine  21 mg Transdermal Daily   QUEtiapine  25 mg Oral QHS   tamsulosin  0.4 mg Oral QPC breakfast   traZODone  50 mg Oral QHS   Continuous Infusions:  azithromycin (ZITHROMAX) 500 MG IVPB (Vial-Mate [employer]or)     cefTRIAXone (ROCEPHIN)  IV       Anti-infectives (From admission, onward)    Start     Dose/Rate Route Frequency Ordered Stop   01/27/21 1800  azithromycin (ZITHROMAX) 500 mg in sodium chloride 0.9 % 250 mL IVPB  500 mg 250 mL/hr over 60 Minutes Intravenous Every 24 hours 01/26/21 1743     01/27/21 1600  cefTRIAXone (ROCEPHIN) 1 g in sodium chloride 0.9 % 100 mL IVPB        1 g 200 mL/hr over 30 Minutes Intravenous Every 24 hours 01/26/21 1743     01/26/21 1615  cefTRIAXone (ROCEPHIN) 1 g in sodium chloride 0.9 % 100 mL IVPB        1 g 200 mL/hr over 30 Minutes Intravenous  Once 01/26/21 1611 01/26/21 1718   01/26/21 1615  azithromycin (ZITHROMAX) 500 mg in sodium chloride 0.9 % 250 mL IVPB        500 mg 250 mL/hr over 60 Minutes Intravenous  Once 01/26/21 1611 01/26/21 1824              Family  Communication/Anticipated D/C date and plan/Code Status   DVT prophylaxis: SCDs Start: 01/26/21 1642     Code Status: DNR  Family Communication: None Disposition Plan: Possible discharge home in 2 to 3 days   Status is: Inpatient  Remains inpatient appropriate because: IV antibiotics           Subjective:   C/o cough, hemoptysis and shortness of breath.  His granddaughter was at the bedside  Objective:    Vitals:   01/27/21 0830 01/27/21 0831 01/27/21 0900 01/27/21 1030  BP:  (!) 189/81 (!) 179/79 (!) 189/128  Pulse: 83  70 83  Resp: 18  (!) 25 18  Temp:      TempSrc:      SpO2: 100%  99% 98%  Weight:      Height:       No data found.   Intake/Output Summary (Last 24 hours) at 01/27/2021 1145 Last data filed at 01/27/2021 0739 Gross per 24 hour  Intake 250 ml  Output 1000 ml  Net -750 ml   Filed Weights   01/26/21 0928  Weight: 54 kg    Exam:  GEN: NAD SKIN: Warm and dry EYES: No pallor or icterus ENT: MMM CV: RRR PULM: Decreased air entry bilaterally, mild bilateral wheezing ABD: soft, ND, NT, +BS CNS: AAO x 3, non focal EXT: No edema or tenderness        Data Reviewed:   I have personally reviewed following labs and imaging studies:  Labs: Labs show the following:   Basic Metabolic Panel: Recent Labs  Lab 01/26/21 0935 01/26/21 1758 01/27/21 0815  NA 131*  --  136  K 3.4*  --  3.6  CL 95*  --  94*  CO2 29  --  33*  GLUCOSE 166*  --  111*  BUN 14  --  14  CREATININE 0.82  --  0.83  CALCIUM 8.3*  --  8.6*  MG  --  2.0 2.0   GFR Estimated Creatinine Clearance: 58.7 mL/min (by C-G formula based on SCr of 0.83 mg/dL). Liver Function Tests: Recent Labs  Lab 01/26/21 0935  AST 37  ALT 33  ALKPHOS 121  BILITOT 0.6  PROT 6.7  ALBUMIN 3.5   No results for input(s): LIPASE, AMYLASE in the last 168 hours. No results for input(s): AMMONIA in the last 168 hours. Coagulation profile Recent Labs  Lab 01/26/21 0935  01/26/21 1758  INR 1.1 1.1    CBC: Recent Labs  Lab 01/26/21 0935  WBC 6.3  HGB 8.6*  HCT 26.0*  MCV 93.2  PLT 188   Cardiac Enzymes: No results for input(s): CKTOTAL,  CKMB, CKMBINDEX, TROPONINI in the last 168 hours. BNP (last 3 results) No results for input(s): PROBNP in the last 8760 hours. CBG: Recent Labs  Lab 01/27/21 0734  GLUCAP 125*   D-Dimer: No results for input(s): DDIMER in the last 72 hours. Hgb A1c: Recent Labs    01/26/21 1758  HGBA1C 5.4   Lipid Profile: Recent Labs    01/27/21 0815  CHOL 98  HDL 61  LDLCALC 29  TRIG 41  CHOLHDL 1.6   Thyroid function studies: No results for input(s): TSH, T4TOTAL, T3FREE, THYROIDAB in the last 72 hours.  Invalid input(s): FREET3 Anemia work up: No results for input(s): VITAMINB12, FOLATE, FERRITIN, TIBC, IRON, RETICCTPCT in the last 72 hours. Sepsis Labs: Recent Labs  Lab 01/26/21 0935 01/26/21 1758  PROCALCITON  --  <0.10  WBC 6.3  --     Microbiology Recent Results (from the past 240 hour(s))  Resp Panel by RT-PCR (Flu A&B, Covid) Nasopharyngeal Swab     Status: None   Collection Time: 01/26/21 11:56 AM   Specimen: Nasopharyngeal Swab; Nasopharyngeal(NP) swabs in vial transport medium  Result Value Ref Range Status   SARS Coronavirus 2 by RT PCR NEGATIVE NEGATIVE Final    Comment: (NOTE) SARS-CoV-2 target nucleic acids are NOT DETECTED.  The SARS-CoV-2 RNA is generally detectable in upper respiratory specimens during the acute phase of infection. The lowest concentration of SARS-CoV-2 viral copies this assay can detect is 138 copies/mL. A negative result does not preclude SARS-Cov-2 infection and should not be used as the sole basis for treatment or other patient management decisions. A negative result may occur with  improper specimen collection/handling, submission of specimen other than nasopharyngeal swab, presence of viral mutation(s) within the areas targeted by this assay, and  inadequate number of viral copies(<138 copies/mL). A negative result must be combined with clinical observations, patient history, and epidemiological information. The expected result is Negative.  Fact Sheet for Patients:  EntrepreneurPulse.com.au  Fact Sheet for Healthcare Providers:  IncredibleEmployment.be  This test is no t yet approved or cleared by the [place] FDA and  has been authorized for detection and/or diagnosis of SARS-CoV-2 by FDA under an Emergency Use Authorization (EUA). This EUA will remain  in effect (meaning this test can be used) for the duration of the COVID-19 declaration under Section 564(b)(1) of the Act, 21 U.S.C.section 360bbb-3(b)(1), unless the authorization is terminated  or revoked sooner.       Influenza A by PCR NEGATIVE NEGATIVE Final   Influenza B by PCR NEGATIVE NEGATIVE Final    Comment: (NOTE) The Xpert Xpress SARS-CoV-2/FLU/RSV plus assay is intended as an aid in the diagnosis of influenza from Nasopharyngeal swab specimens and should not be used as a sole basis for treatment. Nasal washings and aspirates are unacceptable for Xpert Xpress SARS-CoV-2/FLU/RSV testing.  Fact Sheet for Patients: EntrepreneurPulse.com.au  Fact Sheet for Healthcare Providers: IncredibleEmployment.be  This test is not yet approved or cleared by the [place] FDA and has been authorized for detection and/or diagnosis of SARS-CoV-2 by FDA under an Emergency Use Authorization (EUA). This EUA will remain in effect (meaning this test can be used) for the duration of the COVID-19 declaration under Section 564(b)(1) of the Act, 21 U.S.C. section 360bbb-3(b)(1), unless the authorization is terminated or revoked.  Performed at [facility], [place], [place] [address]   CULTURE, BLOOD (ROUTINE X 2) w Reflex to ID Panel     Status: None (Preliminary result)  Collection Time: 01/26/21  5:58 PM   Specimen: BLOOD  Result Value Ref Range Status   Specimen Description BLOOD RIGHT ANTECUBITAL  Final   Special Requests   Final    BOTTLES DRAWN AEROBIC AND ANAEROBIC Blood Culture results may not be optimal due to an inadequate volume of blood received in culture bottles   Culture   Final    NO GROWTH < 24 HOURS Performed at [facility], [address], [place], [place] [address]    Report Status PENDING  Incomplete  CULTURE, BLOOD (ROUTINE X 2) w Reflex to ID Panel     Status: None (Preliminary result)   Collection Time: 01/26/21  5:58 PM   Specimen: BLOOD  Result Value Ref Range Status   Specimen Description BLOOD BLOOD LEFT HAND  Final   Special Requests   Final    BOTTLES DRAWN AEROBIC AND ANAEROBIC Blood Culture results may not be optimal due to an inadequate volume of blood received in culture bottles   Culture   Final    NO GROWTH < 24 HOURS Performed at [facility], [address] [place], [place] [address]    Report Status PENDING  Incomplete    Procedures and diagnostic studies:  DG Chest 2 View  Result Date: 01/26/2021 CLINICAL DATA:  Shortness of breath EXAM: CHEST - 2 VIEW COMPARISON:  Chest x-ray dated December 09, 2020 FINDINGS: Cardiac and mediastinal contours are unchanged status post CABG. Heterogeneous opacities of the left upper lung. No large pleural effusion or pneumothorax. IMPRESSION: Heterogeneous opacities of the left upper lung, possibly due to infection or edema, pulmonary hemorrhage is also a consideration given history of hemoptysis. Recommend follow-up chest x-ray in 6-8 weeks to ensure resolution. Electronically Signed   By: Yetta Glassman M.D.   On: 01/26/2021 10:12   CT Angio Chest PE W and/or Wo Contrast  Result Date: 01/26/2021 CLINICAL DATA:  Shortness of breath with hemoptysis EXAM: CT ANGIOGRAPHY CHEST WITH CONTRAST TECHNIQUE: Multidetector CT imaging of the chest was performed  using the standard protocol during bolus administration of intravenous contrast. Multiplanar CT image reconstructions and MIPs were obtained to evaluate the vascular anatomy. CONTRAST:  23mL OMNIPAQUE IOHEXOL 350 MG/ML SOLN COMPARISON:  Chest x-ray 01/26/2021, CT 12/12/2020, 07/30/2018 FINDINGS: Cardiovascular: Satisfactory opacification of the pulmonary arteries to the segmental level. No evidence of pulmonary embolism. Epicardial leads are noted. Post CABG changes. No pericardial effusion. Coronary vascular calcification. Moderate aortic atherosclerosis. Aorta is nonaneurysmal. Mediastinum/Nodes: Midline trachea. No suspicious lymph nodes. Esophagus within normal limits. Lungs/Pleura: Emphysema. New ground-glass density in the lingula, left upper lobe and patchy ground-glass density in the right lower lobe suspicious for pneumonia. Aeration in the right lung base is improved. 10 mm average diameter slightly spiculated nodule in the right lower lobe, series 7, image 89. Scattered areas of mucous plugging. Bilateral bronchial wall thickening. Upper Abdomen: No acute abnormality. Musculoskeletal: Post sternotomy changes. No acute osseous abnormality Review of the MIP images confirms the above findings. IMPRESSION: 1. Negative for acute pulmonary embolus. 2. Emphysema. New ground-glass density in the lingula and left upper lobe with mild ground-glass density in the right lower lobe, findings are suspicious for bilateral pneumonia. 3. 10 mm slightly spiculated appearing nodule at the right lung base. Consider one of the following in 3 months for both low-risk and high-risk individuals: (a) repeat chest CT, (b) follow-up PET-CT, or (c) tissue sampling. This recommendation follows the consensus statement: Guidelines for Management of Incidental Pulmonary Nodules Detected on  CT Images: From the Fleischner Society 2017; Radiology 2017; [phone]. Aortic Atherosclerosis (ICD10-I70.0) and Emphysema (ICD10-J43.9).  Electronically Signed   By: Donavan Foil M.D.   On: 01/26/2021 15:55               LOS: 1 day   Tippi Mccrae  [facility]   Pager on [email or web address]. If 7PM-7AM, please contact night-coverage at [email or web address]     01/27/2021, 11:45 AM

## 2021-01-27 NOTE — Progress Notes (Signed)
*  PRELIMINARY RESULTS* Echocardiogram 2D Echocardiogram has been performed.  Todd Whitehead 01/27/2021, 11:20 AM

## 2021-01-27 NOTE — ED Notes (Signed)
Pt reports morning labs were drawn by Lab.  Pt provided a specimen cup to collect a sputum sample.

## 2021-01-27 NOTE — TOC Initial Note (Signed)
Transition of Care [facility]) - Initial/Assessment Note    Patient Details  Name: Todd Whitehead MRN: [id number] Date of Birth: 11-28-1945  Transition of Care [facility]) CM/SW Contact:    Shelbie Hutching, RN Phone Number: 01/27/2021, 12:20 PM  Clinical Narrative:                 Patient admitted to the hospital with afib with RVR and CHF.  TOC consult for HF [facility] Screen and discharge needs.  RNCM met with patient at the bedside, granddaughter, Romelle Starcher, also present at the bedside.  Patient is from home where he lives alone and is independent.  He says that a friend stays with him sometimes.  Patient drives.  He is current with his PCP, Dr. Ruthann Cancer and he is current with the HF clinic.   Patient is on chronic O2 through [employer] at home.  Patient declines [place] services and does not have any DME needs.    TOC will cont to follow, patient would like to get a portable concentrator from his oxygen, RNCM will reach out to [employer].    Expected Discharge Plan: Home/Self Care Barriers to Discharge: Continued Medical Work up   Patient Goals and CMS Choice Patient states their goals for this hospitalization and ongoing recovery are:: Patient wants to be able to breath      Expected Discharge Plan and Services Expected Discharge Plan: Home/Self Care   Discharge Planning Services: CM Consult   Living arrangements for the past 2 months: Single Family Home                 DME Arranged: N/A DME Agency: NA       HH Arranged: Patient Refused [place] Agency: NA        Prior Living Arrangements/Services Living arrangements for the past 2 months: Single Family Home Lives with:: Self Patient language and need for interpreter reviewed:: Yes Do you feel safe going back to the place where you live?: Yes      Need for Family Participation in Patient Care: Yes (Comment) Care giver support system in place?: Yes (comment) (friend, granddaughter) Current home services: DME (oxygen) Criminal Activity/Legal  Involvement Pertinent to Current Situation/Hospitalization: No - Comment as needed  Activities of Daily Living      Permission Sought/Granted Permission sought to share information with : Case Manager, Family Supports Permission granted to share information with : Yes, Verbal Permission Granted  Share Information with NAME: Loren Racer     Permission granted to share info w Relationship: friend  Permission granted to share info w Contact Information: [phone]  Emotional Assessment Appearance:: Appears stated age Attitude/Demeanor/Rapport: Engaged Affect (typically observed): Accepting Orientation: : Oriented to Self, Oriented to Place, Oriented to  Time, Oriented to Situation Alcohol / Substance Use: Not Applicable Psych Involvement: No (comment)  Admission diagnosis:  Acute on chronic systolic CHF (congestive heart failure) (HCC) [I50.23] Patient Active Problem List   Diagnosis Date Noted   Acute on chronic systolic CHF (congestive heart failure) ([place]) 01/26/2021   Hypertensive urgency 01/26/2021   HLD (hyperlipidemia) 01/26/2021   COPD (chronic obstructive pulmonary disease) ([place]) 01/26/2021   Tobacco abuse 01/26/2021   Hemoptysis 01/26/2021   Hypokalemia 01/26/2021   Lung nodule 01/26/2021   Elevated troponin 01/26/2021   Normocytic anemia 01/26/2021   Protein-calorie malnutrition, severe ([place]) 01/26/2021   CAP (community acquired pneumonia) 01/26/2021   SOB (shortness of breath)    Hypoxia    Symptomatic anemia    CHF (congestive  heart failure) ([place]) 12/09/2020   Anemia 11/18/2020   Low serum vitamin B12 11/18/2020   Celiac artery stenosis ([place]) 10/23/2020   Hyperlipidemia 02/07/2018   Epistaxis 11/08/2017   PVD (peripheral vascular disease) with claudication ([place]) 09/28/2017   Hypertension 06/25/2017   Abdominal pain 06/25/2017   Aortic atherosclerosis ([place]) 06/25/2017   Type 2 diabetes mellitus with stage 3 chronic kidney disease, without long-term  current use of insulin ([place]) 12/24/2016   Bile duct calculus with acute cholecystitis 08/17/2016   Acute cholecystitis    Personal history of tobacco use, presenting hazards to health 07/08/2016   Health care maintenance 12/19/2015   Elevated LFTs 10/10/2015   Edema of right lower extremity 10/10/2015   COPD (chronic obstructive pulmonary disease) with chronic bronchitis ([place]) 10/04/2015   Depression with anxiety 09/11/2015   Carotid artery disease ([place]) 09/09/2015   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   CAD (coronary artery disease) 08/22/2015   Benign non-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 03/21/2015   Chronic low back pain 03/21/2015   Depression, major, in remission ([place]) 03/21/2015   PCP:  [name], MD Pharmacy:   [facility] [place], [place] - [address] [place] [address] Phone: [phone] Fax: [place] [id number] [address] [facility], [place] AT [facility] OF [address] [place] [address] Phone: [phone] Fax: [phone]     Social Determinants of Health ([place]) Interventions    Readmission Risk Interventions Readmission Risk Prevention Plan 01/27/2021  Transportation Screening Complete  PCP or Specialist Appt within 5-7 Days Complete  Home Care Screening Patient refused  Medication Review (RN CM) Complete  Some recent data might be hidden

## 2021-01-27 NOTE — ED Notes (Signed)
Pt. Up to side of bed to use urinal with stand by assist. Warm blankets provided, pt. Repositioned for comfort. Denies any further need at this time.

## 2021-01-27 NOTE — ED Notes (Signed)
Pts blood pressure appears to be elevated. PRN medication will be given

## 2021-01-28 LAB — CBC WITH DIFFERENTIAL/PLATELET
Abs Immature Granulocytes: 0.03 10*3/uL (ref 0.00–0.07)
Basophils Absolute: 0 10*3/uL (ref 0.0–0.1)
Basophils Relative: 1 %
Eosinophils Absolute: 0 10*3/uL (ref 0.0–0.5)
Eosinophils Relative: 0 %
HCT: 24.4 % — ABNORMAL LOW (ref 39.0–52.0)
Hemoglobin: 8.1 g/dL — ABNORMAL LOW (ref 13.0–17.0)
Immature Granulocytes: 1 %
Lymphocytes Relative: 12 %
Lymphs Abs: 0.7 10*3/uL (ref 0.7–4.0)
MCH: 30.3 pg (ref 26.0–34.0)
MCHC: 33.2 g/dL (ref 30.0–36.0)
MCV: 91.4 fL (ref 80.0–100.0)
Monocytes Absolute: 0.6 10*3/uL (ref 0.1–1.0)
Monocytes Relative: 11 %
Neutro Abs: 4.5 10*3/uL (ref 1.7–7.7)
Neutrophils Relative %: 75 %
Platelets: 167 10*3/uL (ref 150–400)
RBC: 2.67 MIL/uL — ABNORMAL LOW (ref 4.22–5.81)
RDW: 15.2 % (ref 11.5–15.5)
WBC: 5.9 10*3/uL (ref 4.0–10.5)
nRBC: 0 % (ref 0.0–0.2)

## 2021-01-28 LAB — MAGNESIUM: Magnesium: 1.9 mg/dL (ref 1.7–2.4)

## 2021-01-28 LAB — BASIC METABOLIC PANEL
Anion gap: 7 (ref 5–15)
BUN: 11 mg/dL (ref 8–23)
CO2: 33 mmol/L — ABNORMAL HIGH (ref 22–32)
Calcium: 8.5 mg/dL — ABNORMAL LOW (ref 8.9–10.3)
Chloride: 94 mmol/L — ABNORMAL LOW (ref 98–111)
Creatinine, Ser: 0.85 mg/dL (ref 0.61–1.24)
GFR, Estimated: >60 mL/min (ref >60)
Glucose, Bld: 108 mg/dL — ABNORMAL HIGH (ref 70–99)
Potassium: 3.2 mmol/L — ABNORMAL LOW (ref 3.5–5.1)
Sodium: 134 mmol/L — ABNORMAL LOW (ref 135–145)

## 2021-01-28 LAB — GLUCOSE, CAPILLARY
Glucose-Capillary: 119 mg/dL — ABNORMAL HIGH (ref 70–99)
Glucose-Capillary: 123 mg/dL — ABNORMAL HIGH (ref 70–99)
Glucose-Capillary: 169 mg/dL — ABNORMAL HIGH (ref 70–99)

## 2021-01-28 MED ORDER — POTASSIUM CHLORIDE CRYS ER 20 MEQ PO TBCR
40.0000 meq | EXTENDED_RELEASE_TABLET | Freq: Once | ORAL | Status: AC
  Administered 2021-01-28: 40 meq via ORAL
  Filled 2021-01-28: qty 2

## 2021-01-28 MED ORDER — IPRATROPIUM-ALBUTEROL 0.5-2.5 (3) MG/3ML IN SOLN
3.0000 mL | Freq: Four times a day (QID) | RESPIRATORY_TRACT | Status: DC
  Administered 2021-01-28 – 2021-01-30 (×9): 3 mL via RESPIRATORY_TRACT
  Filled 2021-01-28 (×9): qty 3

## 2021-01-28 MED ORDER — FUROSEMIDE 40 MG PO TABS
40.0000 mg | ORAL_TABLET | Freq: Every day | ORAL | Status: DC
  Administered 2021-01-28 – 2021-01-30 (×3): 40 mg via ORAL
  Filled 2021-01-28 (×3): qty 1
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
## 2021-01-29 MED ORDER — CEFDINIR 300 MG PO CAPS
300.0000 mg | ORAL_CAPSULE | Freq: Two times a day (BID) | ORAL | Status: DC
  Administered 2021-01-29 – 2021-01-30 (×2): 300 mg via ORAL
  Filled 2021-01-29 (×3): qty 1

## 2021-01-29 MED ORDER — CEFDINIR 300 MG PO CAPS: 300.0000 mg | ORAL_CAPSULE | Freq: Two times a day (BID) | ORAL | Status: DC

## 2021-01-29 MED ORDER — AZITHROMYCIN 250 MG PO TABS: 500.0000 mg | ORAL_TABLET | Freq: Every day | ORAL | Status: DC

## 2021-01-29 MED ORDER — AZITHROMYCIN 250 MG PO TABS
500.0000 mg | ORAL_TABLET | Freq: Every day | ORAL | Status: AC
  Administered 2021-01-29: 500 mg via ORAL
  Filled 2021-01-29: qty 2

## 2021-01-29 NOTE — Progress Notes (Signed)
[facility] [place] NAME: Gunnar Hereford    MR#:  [id number]  DATE OF BIRTH:  1946/01/10  SUBJECTIVE:  Doing well today Breathing much better No family at bedside Down to his chronic 3L /min [place] oxygen No hemoptysis  REVIEW OF SYSTEMS:   Review of Systems  Constitutional:  Positive for malaise/fatigue. Negative for chills, fever and weight loss.  HENT:  Negative for ear discharge, ear pain and nosebleeds.   Eyes:  Negative for blurred vision, pain and discharge.  Respiratory:  Positive for shortness of breath. Negative for sputum production, wheezing and stridor.   Cardiovascular:  Negative for chest pain, palpitations, orthopnea and PND.  Gastrointestinal:  Negative for abdominal pain, diarrhea, nausea and vomiting.  Genitourinary:  Negative for frequency and urgency.  Musculoskeletal:  Negative for back pain and joint pain.  Neurological:  Positive for weakness. Negative for sensory change, speech change and focal weakness.  Psychiatric/Behavioral:  Negative for depression and hallucinations. The patient is not nervous/anxious.   Tolerating Diet:yes Tolerating PT:  HHPT    DRUG ALLERGIES:   Allergies  Allergen Reactions   Other Nausea And Vomiting    Cheese,butter,sour cream,cottage cheese   Poison Ivy Extract Rash   Poison Oak Extract Rash    VITALS:  Blood pressure (!) 171/86, pulse 90, temperature 98.2 F (36.8 C), resp. rate (!) 21, height 5\' 7"  (1.702 m), weight 51.5 kg, SpO2 94 %.  PHYSICAL EXAMINATION:   Physical Exam  GENERAL:  76 y.o.-year-old patient lying in the bed with no acute distress.  HEENT: Head atraumatic, normocephalic. Oropharynx and nasopharynx clear.  LUNGS: distant breath sounds bilaterally, no wheezing, rales, rhonchi. No use of accessory muscles of respiration.  CARDIOVASCULAR: S1, S2 normal. No murmurs, rubs, or gallops.  ABDOMEN: Soft, nontender, nondistended. Bowel sounds present. No organomegaly  or mass.  EXTREMITIES: No cyanosis, clubbing or edema b/l.    NEUROLOGIC: nonfocal PSYCHIATRIC:  patient is alert and oriented x 3.  SKIN: No obvious rash, lesion, or ulcer.   LABORATORY PANEL:  CBC Recent Labs  Lab 01/28/21 0616  WBC 5.9  HGB 8.1*  HCT 24.4*  PLT 167    Chemistries  Recent Labs  Lab 01/26/21 0935 01/26/21 1758 01/28/21 0616  NA 131*   < > 134*  K 3.4*   < > 3.2*  CL 95*   < > 94*  CO2 29   < > 33*  GLUCOSE 166*   < > 108*  BUN 14   < > 11  CREATININE 0.82   < > 0.85  CALCIUM 8.3*   < > 8.5*  MG  --    < > 1.9  AST 37  --   --   ALT 33  --   --   ALKPHOS 121  --   --   BILITOT 0.6  --   --    < > = values in this interval not displayed.   Cardiac Enzymes No results for input(s): TROPONINI in the last 168 hours. RADIOLOGY:  No results found. ASSESSMENT AND PLAN:  Bosco Paparella is a 76 y.o. male with medical history significant of hypertension, hyperlipidemia, COPD on 3 L oxygen, depression with anxiety, BPH, PVD, CAD, CABG, stent placement, CHF with EF of 45-50%, tobacco abuse, anemia, carotid artery stenosis.  He presented to the hospital with cough, shortness of breath.  Cough is sometimes productive of bloody phlegm.   Community-acquired bilateral pneumonia, hemoptysis:  --  Continue empiric IV antibiotics.--change to oral abxs -- No growth on blood cultures thus far.   --CTA was negative for pulmonary embolism  Acute on chronic systolic and diastolic CHF: Discontinue IV Lasix.  Start oral Lasix.  2D echo showed EF estimated at 45 to 50%, mild LVH, grade 1 diastolic dysfunction   Acute on chronic hypoxemic respiratory failure: on chronic home oxygen. Chronic COPD Chronic Smoker --pt is down to 3 l/min [place] oxygen --Continue bronchodilators  Hypertensive urgency, hypertension: BP is better.  Continue antihypertensives   10 mm right lower lobe nodule: -- Outpatient follow-up with repeat CT chest and PCP/ pulmonologist strongly recommended.  Silva Bandy, his  his ex-wife, said that he has an appointment with a pulmonologist on 01/31/2021.     Procedures: Family communication :Loren Racer --ex-wife Consults : CODE STATUS: dnr DVT Prophylaxis :SCD Level of care: Progressive Status is: Inpatient  Remains inpatient appropriate because: doing well d/c home in am        TOTAL TIME TAKING CARE OF THIS PATIENT: 25 minutes.  >50% time spent on counselling and coordination of care  Note: This dictation was prepared with Dragon dictation along with smaller phrase technology. Any transcriptional errors that result from this process are unintentional.  Fritzi Mandes M.D    [facility]   CC: Primary care physician; [name], MD Patient ID: Loyal Jacobson, male   DOB: 01-14-1946, 76 y.o.   MRN: [id number]

## 2021-01-30 LAB — CULTURE, RESPIRATORY W GRAM STAIN: Culture: NORMAL

## 2021-01-30 LAB — LEGIONELLA PNEUMOPHILA SEROGP 1 UR AG: L. pneumophila Serogp 1 Ur Ag: NEGATIVE

## 2021-01-30 MED ORDER — LISINOPRIL 5 MG PO TABS: 5.0000 mg | ORAL_TABLET | Freq: Every day | ORAL | 0 refills | Status: DC

## 2021-01-30 MED ORDER — CEFDINIR 300 MG PO CAPS: 300.0000 mg | ORAL_CAPSULE | Freq: Two times a day (BID) | ORAL | 0 refills | Status: AC

## 2021-01-30 MED ORDER — NICOTINE 21 MG/24HR TD PT24: 21.0000 mg | MEDICATED_PATCH | Freq: Every day | TRANSDERMAL | 0 refills | Status: DC

## 2021-01-30 MED ORDER — ALBUTEROL SULFATE (2.5 MG/3ML) 0.083% IN NEBU: 2.5000 mg | INHALATION_SOLUTION | Freq: Four times a day (QID) | RESPIRATORY_TRACT | 12 refills | Status: DC | PRN

## 2021-01-30 MED ORDER — ADULT MULTIVITAMIN W/MINERALS CH: 1.0000 | ORAL_TABLET | Freq: Every day | ORAL | 0 refills | Status: AC

## 2021-01-30 MED ORDER — DM-GUAIFENESIN ER 30-600 MG PO TB12: 1.0000 | ORAL_TABLET | Freq: Two times a day (BID) | ORAL | 0 refills | Status: AC

## 2021-01-30 MED ORDER — QUETIAPINE FUMARATE 25 MG PO TABS: 25.0000 mg | ORAL_TABLET | Freq: Every day | ORAL | 0 refills | Status: DC

## 2021-01-30 NOTE — TOC Progression Note (Addendum)
Transition of Care [facility]) - Progression Note    Patient Details  Name: Todd Whitehead MRN: [id number] Date of Birth: 1945-03-13  Transition of Care [facility]) CM/SW [place], [place] Phone Number: 01/30/2021, 9:51 AM  Clinical Narrative:     CSW updated by attending requesting nebulizer for patient.  CSW send request for nebulizer to [facility] w/ [employer].  Pending processing and delivery.  Expected Discharge Plan: Home/Self Care Barriers to Discharge: Continued Medical Work up  Expected Discharge Plan and Services Expected Discharge Plan: Home/Self Care   Discharge Planning Services: CM Consult   Living arrangements for the past 2 months: Single Family Home Expected Discharge Date: 01/30/21               DME Arranged: N/A DME Agency: NA       HH Arranged: Patient Refused [place] Agency: NA         Social Determinants of Health ([place]) Interventions    Readmission Risk Interventions Readmission Risk Prevention Plan 01/27/2021  Transportation Screening Complete  PCP or Specialist Appt within 5-7 Days Complete  Home Care Screening Patient refused  Medication Review (RN CM) Complete  Some recent data might be hidden

## 2021-01-30 NOTE — Discharge Instructions (Addendum)
Smoking cessation advised patient advised to keep appointment with pulmonary as before.

## 2021-01-30 NOTE — Plan of Care (Signed)
DISCHARGE NOTE HOME Hardin Hardenbrook to be discharged home per MD order. Discussed prescriptions and follow up appointments with the patient. Medication list explained in detail. Patient verbalized understanding.  Skin clean, dry and intact without evidence of skin break down, no evidence of skin tears noted. IV catheter discontinued intact. Site without signs and symptoms of complications. Dressing and pressure applied. Pt denies pain at the site currently. No complaints noted.  Patient free of lines, drains, and wounds.   An After Visit Summary (AVS) was printed and given to the patient. Patient escorted via wheelchair, and discharged home via private auto.  Stephan Minister, RN

## 2021-01-30 NOTE — Care Management Important Message (Signed)
Important Message  Patient Details  Name: Todd Whitehead MRN: [id number] Date of Birth: 1945-05-08   Medicare Important Message Given:  Yes     Dannette Barbara 01/30/2021, 11:46 AM

## 2021-01-30 NOTE — Discharge Summary (Signed)
[facility] White[facility] at [place] NAME: Todd Whitehead    MR#:  [id number]  DATE OF BIRTH:  [birth year]  DATE OF ADMISSION:  01/26/2021 ADMITTING PHYSICIAN: Ivor Costa, MD  DATE OF DISCHARGE: 01/30/2021  PRIMARY CARE PHYSICIAN: [name], MD    ADMISSION DIAGNOSIS:  Hemoptysis [R04.2] Acute on chronic systolic CHF (congestive heart failure) ([place]) [I50.23] Community acquired pneumonia, unspecified laterality [J18.9]  DISCHARGE DIAGNOSIS:  community acquired pneumonia acute on chronic systolic/diastolic CHF acute on chronic COPD exacerbation. Chronic home oxygen chronic tobacco abuse hypertension  SECONDARY DIAGNOSIS:   Past Medical History:  Diagnosis Date   Anxiety    Arthritis    CHF (congestive heart failure) (HCC)    Colon polyp    COPD (chronic obstructive pulmonary disease) (HCC)    smoker   Dysrhythmia    Glaucoma    Heart murmur    Hypertension    on  no meds   Low serum vitamin B12 11/18/2020   Melanoma ([place])    Resected from Left upper arm.    Myocardial infarction [facility]) 2017   Peripheral vascular disease [facility])    Prostate enlargement     HOSPITAL COURSE:  Todd Whitehead is a 76 y.o. male with medical history significant of hypertension, hyperlipidemia, COPD on 3 L oxygen, depression with anxiety, BPH, PVD, CAD, CABG, stent placement, CHF with EF of 45-50%, tobacco abuse, anemia, carotid artery stenosis.  He presented to the hospital with cough, shortness of breath.  Cough is sometimes productive of bloody phlegm.     Community-acquired bilateral pneumonia --Continue empiric IV antibiotics.--change to oral abxs -- No growth on blood cultures thus far.   --CTA was negative for pulmonary embolism  Acute on chronic systolic and diastolic CHF: Discontinue IV Lasix.   --appears Euvolemic, resovled -- 2D echo showed EF estimated at 45 to 50%, mild LVH, grade 1 diastolic dysfunction --resumed BB and lisinopril   Acute on  chronic hypoxemic respiratory failure: on chronic home oxygen. Chronic COPD Chronic Smoker--advised cessation --pt is down to 3 l/min [place] oxygen --Continue bronchodilators --DME-nebulizer  Hypertensive urgency, hypertension: BP is better.  Continue antihypertensives   10 mm right lower lobe nodule: -- Outpatient follow-up with repeat CT chest and PCP/ pulmonologist strongly recommended. Todd Whitehead, his  his ex-wife, said that he has an appointment with a pulmonologist on 01/31/2021.       Procedures:none Family communication :Todd Whitehead --ex-wife Consults : none CODE STATUS: dnr DVT Prophylaxis :SCD Level of care: Progressive Status is: Inpatient   patient appears at near baseline. Will discharged to home. Discussed with Todd Whitehead this morning.   Home health arranged    CONSULTS OBTAINED:    DRUG ALLERGIES:   Allergies  Allergen Reactions   Other Nausea And Vomiting    Cheese,butter,sour cream,cottage cheese   Poison Ivy Extract Rash   Poison Oak Extract Rash    DISCHARGE MEDICATIONS:   Allergies as of 01/30/2021       Reactions   Other Nausea And Vomiting   Cheese,butter,sour cream,cottage cheese   Poison Ivy Extract Rash   Poison Oak Extract Rash        Medication List     TAKE these medications    acetaminophen 500 MG tablet Commonly known as: TYLENOL Take 1 tablet (500 mg total) by mouth every 6 (six) hours as needed. What changed: reasons to take this   albuterol (2.5 MG/3ML) 0.083% nebulizer solution Commonly known as: PROVENTIL Take  3 mLs (2.5 mg total) by nebulization every 6 (six) hours as needed for wheezing or shortness of breath.   ALPRAZolam 0.5 MG tablet Commonly known as: XANAX Take 0.25 mg by mouth daily as needed for sleep or anxiety.   aspirin EC 81 MG tablet Take 81 mg by mouth daily. Swallow whole.   atorvastatin 40 MG tablet Commonly known as: LIPITOR Take 40 mg by mouth daily.   brimonidine 0.2 % ophthalmic  solution Commonly known as: ALPHAGAN Place 1 drop into both eyes 2 (two) times daily.   budesonide-formoterol 80-4.5 MCG/ACT inhaler Commonly known as: Symbicort Inhale 2 puffs into the lungs 2 (two) times daily.   busPIRone 30 MG tablet Commonly known as: BUSPAR Take 30 mg by mouth 2 (two) times daily.   cefdinir 300 MG capsule Commonly known as: OMNICEF Take 1 capsule (300 mg total) by mouth every 12 (twelve) hours for 3 days.   citalopram 20 MG tablet Commonly known as: CELEXA Take 20 mg by mouth daily.   clopidogrel 75 MG tablet Commonly known as: Plavix Take 1 tablet (75 mg total) by mouth daily.   dextromethorphan-guaiFENesin 30-600 MG 12hr tablet Commonly known as: MUCINEX DM Take 1 tablet by mouth 2 (two) times daily for 5 days.   latanoprost 0.005 % ophthalmic solution Commonly known as: XALATAN Place 1 drop into both eyes at bedtime.   lisinopril 5 MG tablet Commonly known as: ZESTRIL Take 1 tablet (5 mg total) by mouth daily. Start taking on: January 31, 2021   metoprolol succinate 25 MG 24 hr tablet Commonly known as: TOPROL-XL Take 12.5 mg by mouth daily.   multivitamin with minerals Tabs tablet Take 1 tablet by mouth daily. Start taking on: January 31, 2021   nicotine 21 mg/24hr patch Commonly known as: NICODERM CQ - dosed in mg/24 hours Place 1 patch (21 mg total) onto the skin daily. Start taking on: January 31, 2021   QUEtiapine 25 MG tablet Commonly known as: SEROQUEL Take 1 tablet (25 mg total) by mouth at bedtime.   tamsulosin 0.4 MG Caps capsule Commonly known as: FLOMAX Take 1 capsule (0.4 mg total) by mouth daily after breakfast.   traMADol 50 MG tablet Commonly known as: ULTRAM Take 1 tablet (50 mg total) by mouth every 12 (twelve) hours as needed.   traZODone 50 MG tablet Commonly known as: DESYREL Take 50 mg by mouth at bedtime.               Durable Medical Equipment  (From admission, onward)           Start      Ordered   01/30/21 0000  For home use only DME Nebulizer machine       Question Answer Comment  Patient needs a nebulizer to treat with the following condition COPD (chronic obstructive pulmonary disease) ([place])   Length of Need Lifetime      01/30/21 0947            If you experience worsening of your admission symptoms, develop shortness of breath, life threatening emergency, suicidal or homicidal thoughts you must seek medical attention immediately by calling 911 or calling your MD immediately  if symptoms less severe.  You Must read complete instructions/literature along with all the possible adverse reactions/side effects for all the Medicines you take and that have been prescribed to you. Take any new Medicines after you have completely understood and accept all the possible adverse reactions/side effects.   Please note  You were cared for by a hospitalist during your hospital stay. If you have any questions about your discharge medications or the care you received while you were in the hospital after you are discharged, you can call the unit and asked to speak with the hospitalist on call if the hospitalist that took care of you is not available. Once you are discharged, your primary care physician will handle any further medical issues. Please note that NO REFILLS for any discharge medications will be authorized once you are discharged, as it is imperative that you return to your primary care physician (or establish a relationship with a primary care physician if you do not have one) for your aftercare needs so that they can reassess your need for medications and monitor your lab values. Today   SUBJECTIVE   no new complaints on chronic 3 L nasal cannula oxygen  VITAL SIGNS:  Blood pressure (!) 154/75, pulse 78, temperature 98.2 F (36.8 C), resp. rate (!) 22, height 5\' 7"  (1.702 m), weight 51.2 kg, SpO2 95 %.  I/O:   Intake/Output Summary (Last 24 hours) at 01/30/2021  0955 Last data filed at 01/29/2021 1836 Gross per 24 hour  Intake 760.74 ml  Output --  Net 760.74 ml    PHYSICAL EXAMINATION:  GENERAL:  76 y.o.-year-old patient lying in the bed with no acute distress. thin LUNGS: distant breath sounds bilaterally, no wheezing, rales,rhonchi or crepitation. No use of accessory muscles of respiration. Emphysematous chest CARDIOVASCULAR: S1, S2 normal. No murmurs, rubs, or gallops.  ABDOMEN: Soft, non-tender, non-distended. Bowel sounds present. No organomegaly or mass.  EXTREMITIES: No pedal edema, cyanosis, or clubbing.  NEUROLOGIC: non-focal PSYCHIATRIC:  patient is alert and oriented x 3.  SKIN: No obvious rash, lesion, or ulcer.   DATA REVIEW:   CBC  Recent Labs  Lab 01/28/21 0616  WBC 5.9  HGB 8.1*  HCT 24.4*  PLT 167    Chemistries  Recent Labs  Lab 01/26/21 0935 01/26/21 1758 01/28/21 0616  NA 131*   < > 134*  K 3.4*   < > 3.2*  CL 95*   < > 94*  CO2 29   < > 33*  GLUCOSE 166*   < > 108*  BUN 14   < > 11  CREATININE 0.82   < > 0.85  CALCIUM 8.3*   < > 8.5*  MG  --    < > 1.9  AST 37  --   --   ALT 33  --   --   ALKPHOS 121  --   --   BILITOT 0.6  --   --    < > = values in this interval not displayed.    Microbiology Results   Recent Results (from the past 240 hour(s))  Resp Panel by RT-PCR (Flu A&B, Covid) Nasopharyngeal Swab     Status: None   Collection Time: 01/26/21 11:56 AM   Specimen: Nasopharyngeal Swab; Nasopharyngeal(NP) swabs in vial transport medium  Result Value Ref Range Status   SARS Coronavirus 2 by RT PCR NEGATIVE NEGATIVE Final    Comment: (NOTE) SARS-CoV-2 target nucleic acids are NOT DETECTED.  The SARS-CoV-2 RNA is generally detectable in upper respiratory specimens during the acute phase of infection. The lowest concentration of SARS-CoV-2 viral copies this assay can detect is 138 copies/mL. A negative result does not preclude SARS-Cov-2 infection and should not be used as the sole basis  for treatment or other patient management decisions. A negative  result may occur with  improper specimen collection/handling, submission of specimen other than nasopharyngeal swab, presence of viral mutation(s) within the areas targeted by this assay, and inadequate number of viral copies(<138 copies/mL). A negative result must be combined with clinical observations, patient history, and epidemiological information. The expected result is Negative.  Fact Sheet for Patients:  EntrepreneurPulse.com.au  Fact Sheet for Healthcare Providers:  IncredibleEmployment.be  This test is no t yet approved or cleared by the [place] FDA and  has been authorized for detection and/or diagnosis of SARS-CoV-2 by FDA under an Emergency Use Authorization (EUA). This EUA will remain  in effect (meaning this test can be used) for the duration of the COVID-19 declaration under Section 564(b)(1) of the Act, 21 U.S.C.section 360bbb-3(b)(1), unless the authorization is terminated  or revoked sooner.       Influenza A by PCR NEGATIVE NEGATIVE Final   Influenza B by PCR NEGATIVE NEGATIVE Final    Comment: (NOTE) The Xpert Xpress SARS-CoV-2/FLU/RSV plus assay is intended as an aid in the diagnosis of influenza from Nasopharyngeal swab specimens and should not be used as a sole basis for treatment. Nasal washings and aspirates are unacceptable for Xpert Xpress SARS-CoV-2/FLU/RSV testing.  Fact Sheet for Patients: EntrepreneurPulse.com.au  Fact Sheet for Healthcare Providers: IncredibleEmployment.be  This test is not yet approved or cleared by the [place] FDA and has been authorized for detection and/or diagnosis of SARS-CoV-2 by FDA under an Emergency Use Authorization (EUA). This EUA will remain in effect (meaning this test can be used) for the duration of the COVID-19 declaration under Section 564(b)(1) of the Act, 21  U.S.C. section 360bbb-3(b)(1), unless the authorization is terminated or revoked.  Performed at [facility], [address] [place], [place] [address]   Expectorated Sputum Assessment w Gram Stain, Rflx to Resp Cult     Status: None   Collection Time: 01/26/21  1:40 PM   Specimen: Sputum  Result Value Ref Range Status   Specimen Description SPUTUM  Final   Special Requests NONE  Final   Sputum evaluation   Final    THIS SPECIMEN IS ACCEPTABLE FOR SPUTUM CULTURE Performed at [facility], [address], [place], [place] [address]    Report Status 01/27/2021 FINAL  Final  Culture, Respiratory w Gram Stain     Status: None   Collection Time: 01/26/21  1:40 PM   Specimen: SPU  Result Value Ref Range Status   Specimen Description   Final    SPUTUM Performed at [facility], [address] [place], [place] [address]    Special Requests   Final    NONE Reflexed from [id number] Performed at [facility], [address] [place], [place] [address]    Gram Stain   Final    ABUNDANT WBC PRESENT,BOTH PMN AND MONONUCLEAR FEW GRAM POSITIVE COCCI RARE GRAM VARIABLE ROD    Culture   Final    Normal respiratory flora-no Staph aureus or Pseudomonas seen Performed at [facility], [place] [address] [place],  [address]    Report Status 01/30/2021 FINAL  Final  CULTURE, BLOOD (ROUTINE X 2) w Reflex to ID Panel     Status: None (Preliminary result)   Collection Time: 01/26/21  5:58 PM   Specimen: BLOOD  Result Value Ref Range Status   Specimen Description BLOOD RIGHT ANTECUBITAL  Final   Special Requests   Final    BOTTLES DRAWN AEROBIC AND ANAEROBIC Blood Culture results  may not be optimal due to an inadequate volume of blood received in culture bottles   Culture   Final    NO GROWTH 3 DAYS Performed at [facility], [address] [place], [place] [address]    Report Status PENDING  Incomplete  CULTURE, BLOOD (ROUTINE X 2)  w Reflex to ID Panel     Status: None (Preliminary result)   Collection Time: 01/26/21  5:58 PM   Specimen: BLOOD  Result Value Ref Range Status   Specimen Description BLOOD BLOOD LEFT HAND  Final   Special Requests   Final    BOTTLES DRAWN AEROBIC AND ANAEROBIC Blood Culture results may not be optimal due to an inadequate volume of blood received in culture bottles   Culture   Final    NO GROWTH 3 DAYS Performed at [facility], [address] [place], [place] [address]    Report Status PENDING  Incomplete    RADIOLOGY:  No results found.   CODE STATUS:     Code Status Orders  (From admission, onward)           Start     Ordered   01/26/21 1726  Do not attempt resuscitation (DNR)  Continuous        01/26/21 1725           Code Status History     Date Active Date Inactive Code Status Order ID Comments User Context   01/26/2021 1644 01/26/2021 1725 Full Code [id number]  Ivor Costa, MD ED   12/09/2020 1520 12/12/2020 2241 Full Code [id number]  Kayleen Memos, DO ED   11/25/2020 1246 11/25/2020 2054 Full Code [id number]  Katha Cabal, MD Inpatient   11/08/2020 1033 11/08/2020 1935 Full Code [id number]  Katha Cabal, MD Inpatient   11/08/2017 2233 11/09/2017 1636 Full Code [id number]  Amelia Jo, MD Inpatient   10/31/2017 1530 10/31/2017 1902 Full Code [id number]  [name], MD Inpatient   08/17/2016 1641 08/20/2016 2315 Full Code [id number]  Waldron Session, MD Inpatient   08/17/2016 0552 08/17/2016 1618 Full Code [id number]  Jules Husbands, MD ED   08/19/2015 1048 08/19/2015 1628 Full Code [id number]  [name], MD Inpatient   10/02/2012 1324 10/03/2012 1436 Full Code [id number]  [name], MD Inpatient      Advance Directive Documentation    Flowsheet Row Most Recent Value  Type of Advance Directive Living will  Pre-existing out of facility DNR order (yellow form or pink MOST form) --  "MOST" Form in Place? --        TOTAL TIME TAKING  CARE OF THIS PATIENT: 35 minutes.    Fritzi Mandes M.D  [facility]    CC: Primary care physician; [name], MD

## 2021-01-31 LAB — CULTURE, BLOOD (ROUTINE X 2)
Culture: NO GROWTH
Culture: NO GROWTH

## 2021-02-01 ENCOUNTER — Other Ambulatory Visit: Payer: Self-pay | Admitting: *Deleted

## 2021-02-01 DIAGNOSIS — D649 Anemia, unspecified: Secondary | ICD-10-CM

## 2021-02-04 NOTE — Progress Notes (Signed)
°   01/28/21 0010  Assess: MEWS Score  Temp 98 F (36.7 C)  BP (!) 209/83  Pulse Rate 89  Resp (!) 24  SpO2 97 %  O2 Device Nasal Cannula  O2 Flow Rate (L/min) 4 L/min  Assess: MEWS Score  MEWS Temp 0  MEWS Systolic 2  MEWS Pulse 0  MEWS RR 1  MEWS LOC 0  MEWS Score 3  MEWS Score Color Yellow  Assess: if the MEWS score is Yellow or Red  Were vital signs taken at a resting state? Yes  Focused Assessment Change from prior assessment (see assessment flowsheet)  Does the patient meet 2 or more of the SIRS criteria? Yes  Does the patient have a confirmed or suspected source of infection? No  MEWS guidelines implemented *See Row Information* Yes  Treat  MEWS Interventions Administered prn meds/treatments  Pain Scale 0-10  Pain Score 0  Take Vital Signs  Increase Vital Sign Frequency  Yellow: Q 2hr X 2 then Q 4hr X 2, if remains yellow, continue Q 4hrs  Escalate  MEWS: Escalate Yellow: discuss with charge nurse/RN and consider discussing with provider and RRT  Notify: Charge Nurse/RN  Name of Charge Nurse/RN Notified [place],RN  Date Charge Nurse/RN Notified 01/28/21  Time Charge Nurse/RN Notified 0019  Assess: SIRS CRITERIA  SIRS Temperature  0  SIRS Pulse 0  SIRS Respirations  1  SIRS WBC 0  SIRS Score Sum  1

## 2021-02-08 ENCOUNTER — Inpatient Hospital Stay: Payer: Medicare Other | Attending: Oncology

## 2021-02-08 ENCOUNTER — Other Ambulatory Visit: Payer: Self-pay

## 2021-02-08 DIAGNOSIS — Z87891 Personal history of nicotine dependence: Secondary | ICD-10-CM | POA: Diagnosis not present

## 2021-02-08 DIAGNOSIS — D649 Anemia, unspecified: Secondary | ICD-10-CM | POA: Diagnosis not present

## 2021-02-08 DIAGNOSIS — E538 Deficiency of other specified B group vitamins: Secondary | ICD-10-CM | POA: Insufficient documentation

## 2021-02-08 DIAGNOSIS — I5021 Acute systolic (congestive) heart failure: Secondary | ICD-10-CM | POA: Insufficient documentation

## 2021-02-08 DIAGNOSIS — I110 Hypertensive heart disease with heart failure: Secondary | ICD-10-CM | POA: Insufficient documentation

## 2021-02-08 DIAGNOSIS — D472 Monoclonal gammopathy: Secondary | ICD-10-CM | POA: Diagnosis present

## 2021-02-08 DIAGNOSIS — R911 Solitary pulmonary nodule: Secondary | ICD-10-CM | POA: Insufficient documentation

## 2021-02-08 LAB — CBC WITH DIFFERENTIAL/PLATELET
Abs Immature Granulocytes: 0.04 10*3/uL (ref 0.00–0.07)
Basophils Absolute: 0 10*3/uL (ref 0.0–0.1)
Basophils Relative: 0 %
Eosinophils Absolute: 0 10*3/uL (ref 0.0–0.5)
Eosinophils Relative: 0 %
HCT: 22.5 % — ABNORMAL LOW (ref 39.0–52.0)
Hemoglobin: 7.4 g/dL — ABNORMAL LOW (ref 13.0–17.0)
Immature Granulocytes: 1 %
Lymphocytes Relative: 11 %
Lymphs Abs: 0.6 10*3/uL — ABNORMAL LOW (ref 0.7–4.0)
MCH: 30.3 pg (ref 26.0–34.0)
MCHC: 32.9 g/dL (ref 30.0–36.0)
MCV: 92.2 fL (ref 80.0–100.0)
Monocytes Absolute: 0.1 10*3/uL (ref 0.1–1.0)
Monocytes Relative: 2 %
Neutro Abs: 4.8 10*3/uL (ref 1.7–7.7)
Neutrophils Relative %: 86 %
Platelets: 207 10*3/uL (ref 150–400)
RBC: 2.44 MIL/uL — ABNORMAL LOW (ref 4.22–5.81)
RDW: 15.3 % (ref 11.5–15.5)
WBC: 5.6 10*3/uL (ref 4.0–10.5)
nRBC: 0 % (ref 0.0–0.2)

## 2021-02-08 LAB — IRON AND TIBC
Iron: 39 ug/dL — ABNORMAL LOW (ref 45–182)
Saturation Ratios: 11 % — ABNORMAL LOW (ref 17.9–39.5)
TIBC: 363 ug/dL (ref 250–450)
UIBC: 324 ug/dL

## 2021-02-08 LAB — RETIC PANEL
Immature Retic Fract: 19.2 % — ABNORMAL HIGH (ref 2.3–15.9)
RBC.: 2.43 MIL/uL — ABNORMAL LOW (ref 4.22–5.81)
Retic Count, Absolute: 37.9 10*3/uL (ref 19.0–186.0)
Retic Ct Pct: 1.6 % (ref 0.4–3.1)
Reticulocyte Hemoglobin: 33.7 pg (ref >27.9)

## 2021-02-08 LAB — FERRITIN: Ferritin: 42 ng/mL (ref 24–336)

## 2021-02-08 LAB — VITAMIN B12: Vitamin B-12: 899 pg/mL (ref 180–914)

## 2021-02-09 ENCOUNTER — Inpatient Hospital Stay (HOSPITAL_BASED_OUTPATIENT_CLINIC_OR_DEPARTMENT_OTHER): Payer: Medicare Other | Admitting: Oncology

## 2021-02-09 ENCOUNTER — Inpatient Hospital Stay: Payer: Medicare Other

## 2021-02-09 ENCOUNTER — Encounter: Payer: Self-pay | Admitting: Oncology

## 2021-02-09 VITALS — BP 148/78 | HR 84 | Temp 97.8°F | Wt 122.0 lb

## 2021-02-09 DIAGNOSIS — D649 Anemia, unspecified: Secondary | ICD-10-CM

## 2021-02-09 DIAGNOSIS — E538 Deficiency of other specified B group vitamins: Secondary | ICD-10-CM

## 2021-02-09 DIAGNOSIS — R911 Solitary pulmonary nodule: Secondary | ICD-10-CM | POA: Diagnosis not present

## 2021-02-09 DIAGNOSIS — D472 Monoclonal gammopathy: Secondary | ICD-10-CM

## 2021-02-09 MED ORDER — FERROUS SULFATE 325 (65 FE) MG PO TBEC: 325.0000 mg | DELAYED_RELEASE_TABLET | Freq: Two times a day (BID) | ORAL | 3 refills | Status: DC

## 2021-02-09 MED ORDER — CYANOCOBALAMIN 1000 MCG/ML IJ SOLN
1000.0000 ug | Freq: Once | INTRAMUSCULAR | Status: AC
  Administered 2021-02-09: 1000 ug via INTRAMUSCULAR
  Filled 2021-02-09: qty 1

## 2021-02-09 NOTE — Progress Notes (Signed)
Hematology/Oncology Consult note [phone] [phone]   Patient Care Team: Todd Ruths, MD as PCP - General (Internal Medicine) Todd Server, MD as Consulting Physician (Hematology and Oncology)  REFERRING PROVIDER: [name], MD  CHIEF COMPLAINTS/REASON FOR VISIT:  Anemia and vitamin B12 deficiency  HISTORY OF PRESENTING ILLNESS:   Todd Whitehead is a  76 y.o.  male with PMH listed below was seen in consultation at the request of  Todd Ruths, MD  for evaluation of anemia. Patient was accompanied by her friend Todd Whitehead. 8/31 2022, CBC showed a hemoglobin of 10.3, hematocrit 29.5, WBC 5.7  10/20/2020, iron panel showed decreased iron saturation of 12, ferritin 140, TIBC 265.  Patient reports feeling fatigued and tired.  Denies any hematemesis or hematochezia. Patient has chronic abdominal pain, and only is able to eat 1 meal per day.  Patient has significant weight loss as well as some nausea.  07/06/2020 CT abdomen pelvis showed no acute findings.  No explanation of patient's unintended weight loss.  Mucous plugging seen within the right lower lobe bronchi with minimal right base atelectasis or early infiltrate. Patient follows up with gastroenterology Todd [facility]head, has also been seen by surgery to [facility].  Surgical intervention was not recommended.   09/13/2020, CT abdomen pelvis with contrast at [facility] showed extensive atherosclerotic disease.  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  Previous cholecystectomy.  Prominence, bile duct.  Cyst of both kidneys.  Negative for hydronephrosis.  Moderate distention urinary bladder.  Questionable bladder outlet obstruction.  Prostate gland is mildly enlarged and heterogeneous. Patient was last seen by Todd Whitehead on 10/20/2020.  GI recommends EGD and colonoscopy for further evaluation of his symptoms. Patient was seen by vascular surgery Todd Whitehead on 10/24/2020.  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 11/08/2020, aortogram showed a diffusely diseased no hemodynamically significant lesions identified.  SMA was widely patent.  Celiac demonstrates greater than 90% stenosis at its origin but distal to the stenosis appears to be widely patent.  Patient will be rescheduled with plan of upper extremity access which will allow leverage enabling treatment of the lesion and revascularization and stent placement.  Patient was referred to establish care with hematology for evaluation of anemia.  INTERVAL HISTORY Todd Whitehead is a 76 y.o. male who has above history reviewed by me today presents for follow up  01/26/2021 - 01/30/2021, patient was hospitalized due to hemoptysis, acute on chronic systolic CHF, community-acquired pneumonia, acute hypoxic respiratory failure.  2D echo showed EF 94-70%, grade 1 diastolic dysfunction.  Patient had a CT scan done during admission found to have a 10 mm right lower lobe nodule suspicious for lung cancer.  Patient has been seen by Todd Whitehead on 01/31/2021.  Todd Whitehead recommend observation and repeat CT scan in 3 months.  The patient reports fatigue and tired.  Shortness of breath, patient is on nasal cannula oxygen.   Review of Systems  Constitutional:  Positive for appetite change, fatigue and unexpected weight change. Negative for chills, diaphoresis and fever.  HENT:   Negative for hearing loss, lump/mass, nosebleeds and sore throat.   Eyes:  Negative for eye problems and icterus.  Respiratory:  Positive for shortness of breath. Negative for chest tightness, cough, hemoptysis and wheezing.   Cardiovascular:  Negative for chest pain and leg swelling.  Gastrointestinal:  Negative for abdominal distention, abdominal pain, blood in stool, diarrhea, nausea and rectal pain.  Endocrine: Negative for hot flashes.  Genitourinary:  Negative for bladder incontinence, difficulty urinating, dysuria, frequency, hematuria and nocturia.    Musculoskeletal:  Negative for back pain, flank pain, gait problem and myalgias.  Skin:  Negative for rash.  Neurological:  Negative for dizziness, gait problem, headaches, numbness and seizures.  Hematological:  Negative for adenopathy. Does not bruise/bleed easily.  Psychiatric/Behavioral:  Negative for confusion and decreased concentration. The patient is not nervous/anxious.    MEDICAL HISTORY:  Past Medical History:  Diagnosis Date   Anxiety    Arthritis    CHF (congestive heart failure) (HCC)    Colon polyp    COPD (chronic obstructive pulmonary disease) ([place])    smoker   Dysrhythmia    Glaucoma    Heart murmur    Hypertension    on  no meds   Low serum vitamin B12 11/18/2020   Melanoma ([place])    Resected from Left upper arm.    Myocardial infarction [facility]) 2017   Peripheral vascular disease [facility])    Prostate enlargement     SURGICAL HISTORY: Past Surgical History:  Procedure Laterality Date   BACK SURGERY  01/19/98   CARDIAC CATHETERIZATION N/A 08/19/2015   Procedure: Left Heart Cath and Coronary Angiography;  Surgeon: [name], MD;  Location: [place] [facility];  Service: Cardiovascular;  Laterality: N/A;   CHOLECYSTECTOMY N/A 08/19/2016   Procedure: LAPAROSCOPIC CHOLECYSTECTOMY WITH INTRAOPERATIVE CHOLANGIOGRAM;  Surgeon: [name], MD;  Location: [place];  Service: General;  Laterality: N/A;   COLON SURGERY  age 56 days old   " bowel was twisted up"   COLONOSCOPY WITH PROPOFOL N/A 03/19/2016   Procedure: COLONOSCOPY WITH PROPOFOL;  Surgeon: [name], MD;  Location: [facility];  Service: Endoscopy;  Laterality: N/A;   CORONARY ARTERY BYPASS GRAFT  2017   ERCP N/A 08/18/2016   Procedure: ENDOSCOPIC RETROGRADE CHOLANGIOPANCREATOGRAPHY (ERCP);  Surgeon: [name], MD;  Location: [place];  Service: Gastroenterology;  Laterality: N/A;   ESOPHAGOGASTRODUODENOSCOPY (EGD) WITH PROPOFOL N/A 09/18/2018   Procedure: ESOPHAGOGASTRODUODENOSCOPY (EGD)  WITH PROPOFOL;  Surgeon: [name], DO;  Location: [place];  Service: General;  Laterality: N/A;   EYE SURGERY Left 03/26/11   LOWER EXTREMITY ANGIOGRAPHY Left 10/03/2017   Procedure: LOWER EXTREMITY ANGIOGRAPHY;  Surgeon: [name], MD;  Location: [place] [facility];  Service: Cardiovascular;  Laterality: Left;   LOWER EXTREMITY ANGIOGRAPHY Right 10/31/2017   Procedure: LOWER EXTREMITY ANGIOGRAPHY;  Surgeon: [name], MD;  Location: [place] [facility];  Service: Cardiovascular;  Laterality: Right;   LUMBAR LAMINECTOMY/DECOMPRESSION MICRODISCECTOMY Right 10/02/2012   Procedure: Right Lumbar Five-Sacral One Lumbar laminotomy/Microdiskectomy;  Surgeon: [name], MD;  Location: [place] [facility];  Service: Neurosurgery;  Laterality: Right;  Right Lumbar Five-Sacral One Lumbar laminotomy/Microdiskectomy   VISCERAL ANGIOGRAPHY N/A 11/08/2020   Procedure: VISCERAL ANGIOGRAPHY;  Surgeon: Katha Cabal, MD;  Location: [place] [facility];  Service: Cardiovascular;  Laterality: N/A;   VISCERAL ANGIOGRAPHY N/A 11/25/2020   Procedure: VISCERAL ANGIOGRAPHY;  Surgeon: Katha Cabal, MD;  Location: [place] [facility];  Service: Cardiovascular;  Laterality: N/A;    SOCIAL HISTORY: Social History   Socioeconomic History   Marital status: Divorced    Spouse name: Not on file   Number of children: Not on file   Years of education: Not on file   Highest education level: Not on file  Occupational History   Not on file  Tobacco Use   Smoking status: Every Day    Packs/day: 1.50  Years: 58.00    Pack years: 87.00    Types: Cigarettes   Smokeless tobacco: Never  Vaping Use   Vaping Use: Former  Substance and Sexual Activity   Alcohol use: No   Drug use: No   Sexual activity: Not Currently  Other Topics Concern   Not on file  Social History Narrative   Not on file   Social Determinants of Health   Financial Resource Strain: Not on file  Food Insecurity: Not on  file  Transportation Needs: Not on file  Physical Activity: Not on file  Stress: Not on file  Social Connections: Not on file  Intimate Partner Violence: Not on file    FAMILY HISTORY: Family History  Problem Relation Age of Onset   Heart attack Father    Aneurysm Brother    Lung cancer Brother     ALLERGIES:  is allergic to other, poison ivy extract, and poison oak extract.  MEDICATIONS:  Current Outpatient Medications  Medication Sig Dispense Refill   acetaminophen (TYLENOL) 500 MG tablet Take 1 tablet (500 mg total) by mouth every 6 (six) hours as needed. (Patient taking differently: Take 500 mg by mouth every 6 (six) hours as needed for moderate pain or headache.) 30 tablet 0   albuterol (PROVENTIL) (2.5 MG/3ML) 0.083% nebulizer solution Take 3 mLs (2.5 mg total) by nebulization every 6 (six) hours as needed for wheezing or shortness of breath. 75 mL 12   ALPRAZolam (XANAX) 0.5 MG tablet Take 0.25 mg by mouth daily as needed for sleep or anxiety.     aspirin EC 81 MG tablet Take 81 mg by mouth daily. Swallow whole.     atorvastatin (LIPITOR) 40 MG tablet Take 40 mg by mouth daily.      brimonidine (ALPHAGAN) 0.2 % ophthalmic solution Place 1 drop into both eyes 2 (two) times daily.     budesonide-formoterol (SYMBICORT) 80-4.5 MCG/ACT inhaler Inhale 2 puffs into the lungs 2 (two) times daily. 1 each 12   busPIRone (BUSPAR) 30 MG tablet Take 30 mg by mouth 2 (two) times daily.     citalopram (CELEXA) 20 MG tablet Take 20 mg by mouth daily.     clopidogrel (PLAVIX) 75 MG tablet Take 1 tablet (75 mg total) by mouth daily. 30 tablet 5   ferrous sulfate 325 (65 FE) MG EC tablet Take 1 tablet (325 mg total) by mouth 2 (two) times daily with a meal. 60 tablet 3   latanoprost (XALATAN) 0.005 % ophthalmic solution Place 1 drop into both eyes at bedtime.   2   lisinopril (ZESTRIL) 5 MG tablet Take 1 tablet (5 mg total) by mouth daily. 30 tablet 0   metoprolol succinate (TOPROL-XL) 25 MG 24  hr tablet Take 12.5 mg by mouth daily.     Multiple Vitamin (MULTIVITAMIN WITH MINERALS) TABS tablet Take 1 tablet by mouth daily. 30 tablet 0   nicotine (NICODERM CQ - DOSED IN MG/24 HOURS) 21 mg/24hr patch Place 1 patch (21 mg total) onto the skin daily. 28 patch 0   QUEtiapine (SEROQUEL) 25 MG tablet Take 1 tablet (25 mg total) by mouth at bedtime. 30 tablet 0   tamsulosin (FLOMAX) 0.4 MG CAPS capsule Take 1 capsule (0.4 mg total) by mouth daily after breakfast. 30 capsule 2   traMADol (ULTRAM) 50 MG tablet Take 1 tablet (50 mg total) by mouth every 12 (twelve) hours as needed. 30 tablet 2   traZODone (DESYREL) 50 MG tablet Take 50 mg by  mouth at bedtime.     No current facility-administered medications for this visit.     PHYSICAL EXAMINATION: ECOG PERFORMANCE STATUS: 3 - Symptomatic, >50% confined to bed Vitals:   02/09/21 1411  BP: (!) 148/78  Pulse: 84  Temp: 97.8 F (36.6 C)   Filed Weights   02/09/21 1411  Weight: 122 lb (55.3 kg)    Physical Exam Constitutional:      General: He is not in acute distress.    Comments: Thin, patient sits in the wheelchair  HENT:     Head: Normocephalic and atraumatic.  Eyes:     General: No scleral icterus. Cardiovascular:     Rate and Rhythm: Normal rate and regular rhythm.     Heart sounds: Normal heart sounds.  Pulmonary:     Effort: Pulmonary effort is normal.     Comments: Decreased breath sound bilaterally. Abdominal:     General: Bowel sounds are normal. There is no distension.     Palpations: Abdomen is soft.  Musculoskeletal:        General: No deformity. Normal range of motion.     Cervical back: Normal range of motion and neck supple.  Skin:    General: Skin is warm and dry.     Findings: No erythema or rash.  Neurological:     Mental Status: He is alert. Mental status is at baseline.     Cranial Nerves: No cranial nerve deficit.     Coordination: Coordination normal.  Psychiatric:        Mood and Affect: Mood  normal.    LABORATORY DATA:  I have reviewed the data as listed Lab Results  Component Value Date   WBC 5.6 02/08/2021   HGB 7.4 (L) 02/08/2021   HCT 22.5 (L) 02/08/2021   MCV 92.2 02/08/2021   PLT 207 02/08/2021   Recent Labs    11/18/20 1318 11/25/20 0918 12/09/20 1103 12/10/20 0317 01/26/21 0935 01/27/21 0815 01/28/21 0616  NA 133*  --  133*   < > 131* 136 134*  K 3.3*  --  3.3*   < > 3.4* 3.6 3.2*  CL 98  --  101   < > 95* 94* 94*  CO2 29  --  27   < > 29 33* 33*  GLUCOSE 112*  --  111*   < > 166* 111* 108*  BUN 17   < > 14   < > $R'14 14 11  'EF$ CREATININE 1.13   < > 1.20   < > 0.82 0.83 0.85  CALCIUM 8.5*  --  8.4*   < > 8.3* 8.6* 8.5*  GFRNONAA >60   < > >60   < > >60 >60 >60  PROT 6.9  --  6.3*  --  6.7  --   --   ALBUMIN 3.1*  --  3.0*  --  3.5  --   --   AST 29  --  30  --  37  --   --   ALT 17  --  21  --  33  --   --   ALKPHOS 88  --  97  --  121  --   --   BILITOT 0.6  --  0.6  --  0.6  --   --    < > = values in this interval not displayed.    Iron/TIBC/Ferritin/ %Sat    Component Value Date/Time   IRON 39 (L) 02/08/2021 1404  IRON 33 (L) 10/20/2020 1335   TIBC 363 02/08/2021 1404   TIBC 265 10/20/2020 1335   FERRITIN 42 02/08/2021 1404   FERRITIN 140 10/20/2020 1335   IRONPCTSAT 11 (L) 02/08/2021 1404   IRONPCTSAT 12 (L) 10/20/2020 1335       RADIOGRAPHIC STUDIES: I have personally reviewed the radiological images as listed and agreed with the findings in the report. DG Chest 2 View  Result Date: 01/26/2021 CLINICAL DATA:  Shortness of breath EXAM: CHEST - 2 VIEW COMPARISON:  Chest x-ray dated December 09, 2020 FINDINGS: Cardiac and mediastinal contours are unchanged status post CABG. Heterogeneous opacities of the left upper lung. No large pleural effusion or pneumothorax. IMPRESSION: Heterogeneous opacities of the left upper lung, possibly due to infection or edema, pulmonary hemorrhage is also a consideration given history of hemoptysis.  Recommend follow-up chest x-ray in 6-8 weeks to ensure resolution. Electronically Signed   By: Yetta Glassman M.D.   On: 01/26/2021 10:12   CT Angio Chest PE W and/or Wo Contrast  Result Date: 01/26/2021 CLINICAL DATA:  Shortness of breath with hemoptysis EXAM: CT ANGIOGRAPHY CHEST WITH CONTRAST TECHNIQUE: Multidetector CT imaging of the chest was performed using the standard protocol during bolus administration of intravenous contrast. Multiplanar CT image reconstructions and MIPs were obtained to evaluate the vascular anatomy. CONTRAST:  74mL OMNIPAQUE IOHEXOL 350 MG/ML SOLN COMPARISON:  Chest x-ray 01/26/2021, CT 12/12/2020, 07/30/2018 FINDINGS: Cardiovascular: Satisfactory opacification of the pulmonary arteries to the segmental level. No evidence of pulmonary embolism. Epicardial leads are noted. Post CABG changes. No pericardial effusion. Coronary vascular calcification. Moderate aortic atherosclerosis. Aorta is nonaneurysmal. Mediastinum/Nodes: Midline trachea. No suspicious lymph nodes. Esophagus within normal limits. Lungs/Pleura: Emphysema. New ground-glass density in the lingula, left upper lobe and patchy ground-glass density in the right lower lobe suspicious for pneumonia. Aeration in the right lung base is improved. 10 mm average diameter slightly spiculated nodule in the right lower lobe, series 7, image 89. Scattered areas of mucous plugging. Bilateral bronchial wall thickening. Upper Abdomen: No acute abnormality. Musculoskeletal: Post sternotomy changes. No acute osseous abnormality Review of the MIP images confirms the above findings. IMPRESSION: 1. Negative for acute pulmonary embolus. 2. Emphysema. New ground-glass density in the lingula and left upper lobe with mild ground-glass density in the right lower lobe, findings are suspicious for bilateral pneumonia. 3. 10 mm slightly spiculated appearing nodule at the right lung base. Consider one of the following in 3 months for both low-risk  and high-risk individuals: (a) repeat chest CT, (b) follow-up PET-CT, or (c) tissue sampling. This recommendation follows the consensus statement: Guidelines for Management of Incidental Pulmonary Nodules Detected on CT Images: From the Fleischner Society 2017; Radiology 2017; 284:228-243. Aortic Atherosclerosis (ICD10-I70.0) and Emphysema (ICD10-J43.9). Electronically Signed   By: Donavan Foil M.D.   On: 01/26/2021 15:55   ECHOCARDIOGRAM COMPLETE  Result Date: 01/27/2021    ECHOCARDIOGRAM REPORT   Patient Name:   Todd [facility] Date of Exam: 01/27/2021 Medical Rec #:  [id number]       Height:       67.0 in Accession #:    [id number]      Weight:       119.0 lb Date of Birth:  [birth year]       BSA:          1.622 m Patient Age:    29 years        BP:           189/128  mmHg Patient Gender: M               HR:           76 bpm. Exam Location:  [facility] Procedure: 2D Echo, Color Doppler and Cardiac Doppler Indications:     I50.21 congestive heart failure-Acute Systolic  History:         Patient has prior history of Echocardiogram examinations, most                  recent 12/10/2020. Prior CABG, COPD and PVD; Risk                  Factors:Hypertension.  Sonographer:     Charmayne Sheer Referring Phys:  Unknown Foley NIU Diagnosing Phys: Donnelly Angelica  Sonographer Comments: Suboptimal subcostal window and suboptimal parasternal window. TDS due to small rib spaces and Image acquisition challenging due to COPD. IMPRESSIONS  1. Left ventricular ejection fraction, by estimation, is 45 to 50%. The left ventricle has mildly decreased function. The left ventricle demonstrates global hypokinesis. There is mild left ventricular hypertrophy of the basal-septal segment. Left ventricular diastolic parameters are consistent with Grade I diastolic dysfunction (impaired relaxation). Elevated left ventricular end-diastolic pressure.  2. Right ventricular systolic function is normal. The right ventricular size is normal.  3. The mitral valve is  normal in structure. Mild mitral valve regurgitation. No evidence of mitral stenosis.  4. The aortic valve is normal in structure. Aortic valve regurgitation is not visualized. No aortic stenosis is present.  5. The inferior vena cava is normal in size with greater than 50% respiratory variability, suggesting right atrial pressure of 3 mmHg.  6. Evidence of atrial level shunting detected by color flow Doppler. There is a small patent foramen ovale. FINDINGS  Left Ventricle: Left ventricular ejection fraction, by estimation, is 45 to 50%. The left ventricle has mildly decreased function. The left ventricle demonstrates global hypokinesis. The left ventricular internal cavity size was normal in size. There is  mild left ventricular hypertrophy of the basal-septal segment. Left ventricular diastolic parameters are consistent with Grade I diastolic dysfunction (impaired relaxation). Elevated left ventricular end-diastolic pressure. Right Ventricle: The right ventricular size is normal. No increase in right ventricular wall thickness. Right ventricular systolic function is normal. Left Atrium: Left atrial size was normal in size. Right Atrium: Right atrial size was normal in size. Pericardium: There is no evidence of pericardial effusion. Mitral Valve: The mitral valve is normal in structure. Mild mitral valve regurgitation. No evidence of mitral valve stenosis. MV peak gradient, 3.0 mmHg. The mean mitral valve gradient is 2.0 mmHg. Tricuspid Valve: The tricuspid valve is normal in structure. Tricuspid valve regurgitation is trivial. No evidence of tricuspid stenosis. Aortic Valve: The aortic valve is normal in structure. Aortic valve regurgitation is not visualized. No aortic stenosis is present. Aortic valve mean gradient measures 2.0 mmHg. Aortic valve peak gradient measures 4.5 mmHg. Aortic valve area, by VTI measures 3.24 cm. Pulmonic Valve: The pulmonic valve was normal in structure. Pulmonic valve regurgitation  is not visualized. No evidence of pulmonic stenosis. Aorta: The aortic root is normal in size and structure. Venous: The inferior vena cava is normal in size with greater than 50% respiratory variability, suggesting right atrial pressure of 3 mmHg. IAS/Shunts: Evidence of atrial level shunting detected by color flow Doppler. A small patent foramen ovale is detected.  LEFT VENTRICLE PLAX 2D LVIDd:         4.32 cm  Diastology LVIDs:         2.97 cm      LV e' medial:    6.09 cm/s LV PW:         1.17 cm      LV E/e' medial:  14.7 LV IVS:        0.73 cm      LV e' lateral:   8.27 cm/s LVOT diam:     2.10 cm      LV E/e' lateral: 10.8 LV SV:         55 LV SV Index:   34 LVOT Area:     3.46 cm  LV Volumes (MOD) LV vol d, MOD A4C: 136.0 ml LV vol s, MOD A4C: 70.4 ml LV SV MOD A4C:     136.0 ml RIGHT VENTRICLE RV Basal diam:  3.45 cm LEFT ATRIUM             Index        RIGHT ATRIUM           Index LA diam:        4.30 cm 2.65 cm/m   RA Area:     11.10 cm LA Vol (A2C):   47.3 ml 29.16 ml/m  RA Volume:   24.60 ml  15.17 ml/m LA Vol (A4C):   41.2 ml 25.40 ml/m LA Biplane Vol: 45.6 ml 28.11 ml/m  AORTIC VALVE                    PULMONIC VALVE AV Area (Vmax):    3.46 cm     PV Vmax:       1.52 m/s AV Area (Vmean):   3.46 cm     PV Vmean:      101.000 cm/s AV Area (VTI):     3.24 cm     PV VTI:        0.251 m AV Vmax:           106.00 cm/s  PV Peak grad:  9.2 mmHg AV Vmean:          71.500 cm/s  PV Mean grad:  5.0 mmHg AV VTI:            0.169 m AV Peak Grad:      4.5 mmHg AV Mean Grad:      2.0 mmHg LVOT Vmax:         106.00 cm/s LVOT Vmean:        71.500 cm/s LVOT VTI:          0.158 m LVOT/AV VTI ratio: 0.93  AORTA Ao Root diam: 3.30 cm MITRAL VALVE MV Area (PHT): 3.74 cm    SHUNTS MV Area VTI:   2.33 cm    Systemic VTI:  0.16 m MV Peak grad:  3.0 mmHg    Systemic Diam: 2.10 cm MV Mean grad:  2.0 mmHg MV Vmax:       0.86 m/s MV Vmean:      59.9 cm/s MV Decel Time: 203 msec MV E velocity: 89.70 cm/s MV A  velocity: 78.20 cm/s MV E/A ratio:  1.15 Donnelly Angelica Electronically signed by Donnelly Angelica Signature Date/Time: 01/27/2021/3:24:13 PM    Final       ASSESSMENT & PLAN:  1. Anemia, unspecified type   2. Low serum vitamin B12   3. MGUS (monoclonal gammopathy of unknown significance)   4. Lung nodule    #Vitamin B12 deficiency. Vitamin B12 level has improved.  Injection today.  Proceed with another B12  #Normocytic anemia, hemoglobin 7.4 Multifactorial. Iron panel Showed decrease iron saturation of 11.   Recommend ferrous sulfate 325 mg twice daily with meals.  Recommend over-the-counter stool softener if constipation.   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  Light Chain ratio 2.45.  24-hour UPEP showed no M protein..  Patient has anemia, no kidney function impairment, hypercalcemia. Check multiple myeloma/Light chain ratio at the next visit  #Lung nodule, former smoker.  He follows up with Todd [facility] and has a follow-up CT scan Scheduled  # History of melenoma  Orders Placed This Encounter  Procedures   CBC with Differential/Platelet    Standing Status:   Future    Standing Expiration Date:   02/09/2022   Ferritin    Standing Status:   Future    Standing Expiration Date:   02/09/2022   Iron and TIBC    Standing Status:   Future    Standing Expiration Date:   02/09/2022   CBC with Differential/Platelet    Standing Status:   Future    Standing Expiration Date:   02/09/2022   Ferritin    Standing Status:   Future    Standing Expiration Date:   02/09/2022   Iron and TIBC    Standing Status:   Future    Standing Expiration Date:   02/09/2022   Kappa/lambda light chains    Standing Status:   Future    Standing Expiration Date:   02/09/2022   Multiple Myeloma Panel (SPEP&IFE w/QIG)    Standing Status:   Future    Standing Expiration Date:   02/09/2022   Hold Tube- Blood Bank    Standing Status:   Future    Standing Expiration Date:   02/09/2022     All questions were answered. The patient knows to call the clinic with any problems questions or concerns.  cc Todd Ruths, MD    Return of visit:  Repeat CBC, Iron TIBC ferritin in 6 weeks. Follow-up lab MD +/- Venofer in3 months   Todd Server, MD, PhD 02/09/2021

## 2021-02-12 LAB — COPPER, SERUM: Copper: 112 ug/dL (ref 69–132)

## 2021-02-13 ENCOUNTER — Other Ambulatory Visit: Payer: Self-pay

## 2021-02-13 ENCOUNTER — Encounter: Payer: Self-pay | Admitting: Family

## 2021-02-13 ENCOUNTER — Ambulatory Visit: Payer: Medicare Other | Attending: Family | Admitting: Family

## 2021-02-13 VITALS — BP 134/102 | HR 74 | Resp 16 | Ht 68.0 in | Wt 121.5 lb

## 2021-02-13 DIAGNOSIS — F419 Anxiety disorder, unspecified: Secondary | ICD-10-CM | POA: Insufficient documentation

## 2021-02-13 DIAGNOSIS — I110 Hypertensive heart disease with heart failure: Secondary | ICD-10-CM | POA: Insufficient documentation

## 2021-02-13 DIAGNOSIS — J449 Chronic obstructive pulmonary disease, unspecified: Secondary | ICD-10-CM | POA: Diagnosis not present

## 2021-02-13 DIAGNOSIS — I252 Old myocardial infarction: Secondary | ICD-10-CM | POA: Insufficient documentation

## 2021-02-13 DIAGNOSIS — I739 Peripheral vascular disease, unspecified: Secondary | ICD-10-CM | POA: Diagnosis not present

## 2021-02-13 DIAGNOSIS — Z720 Tobacco use: Secondary | ICD-10-CM

## 2021-02-13 DIAGNOSIS — Z8249 Family history of ischemic heart disease and other diseases of the circulatory system: Secondary | ICD-10-CM | POA: Insufficient documentation

## 2021-02-13 DIAGNOSIS — I2510 Atherosclerotic heart disease of native coronary artery without angina pectoris: Secondary | ICD-10-CM | POA: Insufficient documentation

## 2021-02-13 DIAGNOSIS — F17210 Nicotine dependence, cigarettes, uncomplicated: Secondary | ICD-10-CM | POA: Insufficient documentation

## 2021-02-13 DIAGNOSIS — Z79899 Other long term (current) drug therapy: Secondary | ICD-10-CM | POA: Insufficient documentation

## 2021-02-13 DIAGNOSIS — I5022 Chronic systolic (congestive) heart failure: Secondary | ICD-10-CM | POA: Diagnosis not present

## 2021-02-13 DIAGNOSIS — R911 Solitary pulmonary nodule: Secondary | ICD-10-CM | POA: Diagnosis not present

## 2021-02-13 DIAGNOSIS — I10 Essential (primary) hypertension: Secondary | ICD-10-CM | POA: Diagnosis not present

## 2021-02-13 DIAGNOSIS — Z9981 Dependence on supplemental oxygen: Secondary | ICD-10-CM | POA: Insufficient documentation

## 2021-02-13 MED ORDER — SACUBITRIL-VALSARTAN 24-26 MG PO TABS: 1.0000 | ORAL_TABLET | Freq: Two times a day (BID) | ORAL | 3 refills | Status: DC

## 2021-02-13 NOTE — Progress Notes (Signed)
Patient ID: Todd Whitehead, male    DOB: 11-15-1945, 76 y.o.   MRN: [id number]   Mr Schreier is a 76 y/o male with a history of HTN, anxiety, COPD, CAD (MI), PVD, current tobacco use and chronic heart failure.   Echo report from 01/27/21 Left ventricular ejection fraction, by estimation, is 45 to 50%. The left ventricle has mildly decreased function. The left ventricle demonstrates global hypokinesis. There is mild left ventricular hypertrophy of the basal-septal segment. Left ventricular diastolic parameters are consistent with Grade I diastolic dysfunction  LHC done 08/19/15 and showed: Mid LAD lesion, 80% stenosed. Mid Cx lesion, 85% stenosed. Mid RCA lesion, 100% stenosed. LM lesion, 50% stenosed. 3 vessel cad. Mildly reduced lv funciton  Admitted 01/26/21 due to acute on chronic CHF and CAP. New RLL mass found. Admitted 12/09/20 due to HF exacerbation. Initially given IV lasix with transition to oral diuretics. Needed supplemental oxygen. Chest CT showed severe emphysema signs of aspiration. Cardiology consult obtained. Discharged after 3 days on oxygen.    He presents today for follow up visit with a chief complaint of moderate shortness of breath with little exertion. Says that this has been present for several years and that he can get short of breath even when getting dressed. He has associated fatigue, palpitations and anxiety along with this. He denies any dizziness, difficulty sleeping, abdominal distention, pedal edema, chest pain, wheezing, cough or weight gain.   He appears anxious and verbalizes his concern over new pulmonary nodules. Saw pulmonologist for CT, and returns for f/u on 02/14/21. Wears O2 HS and during exertion, 3LPM. Does check his oxygen level at home.  Continues to smoke 1.5 ppd of cigarettes and says that he removes his oxygen now each time he smokes.   Weighs daily, he has been steadily gaining weight intentionally. Previous visit in November he weighed 108, today  121. Baseline home weight is 115.    Past Medical History:  Diagnosis Date   Anxiety    Arthritis    CHF (congestive heart failure) (HCC)    Colon polyp    COPD (chronic obstructive pulmonary disease) ([place])    smoker   Dysrhythmia    Glaucoma    Heart murmur    Hypertension    on  no meds   Low serum vitamin B12 11/18/2020   Melanoma ([place])    Resected from Left upper arm.    Myocardial infarction [facility]) 2017   Peripheral vascular disease [facility])    Prostate enlargement    Past Surgical History:  Procedure Laterality Date   BACK SURGERY  01/19/98   CARDIAC CATHETERIZATION N/A 08/19/2015   Procedure: Left Heart Cath and Coronary Angiography;  Surgeon: [name], MD;  Location: [place] CV LAB;  Service: Cardiovascular;  Laterality: N/A;   CHOLECYSTECTOMY N/A 08/19/2016   Procedure: LAPAROSCOPIC CHOLECYSTECTOMY WITH INTRAOPERATIVE CHOLANGIOGRAM;  Surgeon: [name], MD;  Location: [place];  Service: General;  Laterality: N/A;   COLON SURGERY  age 25 days old   " bowel was twisted up"   COLONOSCOPY WITH PROPOFOL N/A 03/19/2016   Procedure: COLONOSCOPY WITH PROPOFOL;  Surgeon: [name], MD;  Location: [facility] ENDOSCOPY;  Service: Endoscopy;  Laterality: N/A;   CORONARY ARTERY BYPASS GRAFT  2017   ERCP N/A 08/18/2016   Procedure: ENDOSCOPIC RETROGRADE CHOLANGIOPANCREATOGRAPHY (ERCP);  Surgeon: [name], MD;  Location: [place];  Service: Gastroenterology;  Laterality: N/A;   ESOPHAGOGASTRODUODENOSCOPY (EGD) WITH PROPOFOL N/A 09/18/2018   Procedure: ESOPHAGOGASTRODUODENOSCOPY (  EGD) WITH PROPOFOL;  Surgeon: [name], DO;  Location: [facility] ENDOSCOPY;  Service: General;  Laterality: N/A;   EYE SURGERY Left 03/26/11   LOWER EXTREMITY ANGIOGRAPHY Left 10/03/2017   Procedure: LOWER EXTREMITY ANGIOGRAPHY;  Surgeon: [name], MD;  Location: [place] CV LAB;  Service: Cardiovascular;  Laterality: Left;   LOWER EXTREMITY ANGIOGRAPHY Right 10/31/2017   Procedure: LOWER  EXTREMITY ANGIOGRAPHY;  Surgeon: [name], MD;  Location: [place] CV LAB;  Service: Cardiovascular;  Laterality: Right;   LUMBAR LAMINECTOMY/DECOMPRESSION MICRODISCECTOMY Right 10/02/2012   Procedure: Right Lumbar Five-Sacral One Lumbar laminotomy/Microdiskectomy;  Surgeon: [name], MD;  Location: [place] NEURO ORS;  Service: Neurosurgery;  Laterality: Right;  Right Lumbar Five-Sacral One Lumbar laminotomy/Microdiskectomy   VISCERAL ANGIOGRAPHY N/A 11/08/2020   Procedure: VISCERAL ANGIOGRAPHY;  Surgeon: Katha Cabal, MD;  Location: [place] CV LAB;  Service: Cardiovascular;  Laterality: N/A;   VISCERAL ANGIOGRAPHY N/A 11/25/2020   Procedure: VISCERAL ANGIOGRAPHY;  Surgeon: Katha Cabal, MD;  Location: [place] CV LAB;  Service: Cardiovascular;  Laterality: N/A;   Family History  Problem Relation Age of Onset   Heart attack Father    Aneurysm Brother    Lung cancer Brother    Social History   Tobacco Use   Smoking status: Every Day    Packs/day: 1.50    Years: 58.00    Pack years: 87.00    Types: Cigarettes   Smokeless tobacco: Never  Substance Use Topics   Alcohol use: No   Allergies  Allergen Reactions   Other Nausea And Vomiting    Cheese,butter,sour cream,cottage cheese   Poison Ivy Extract Rash   Poison Oak Extract Rash   Prior to Admission medications   Medication Sig Start Date End Date Taking? Authorizing Provider  acetaminophen (TYLENOL) 500 MG tablet Take 1 tablet (500 mg total) by mouth every 6 (six) hours as needed. Patient taking differently: Take 500 mg by mouth every 6 (six) hours as needed for moderate pain or headache. 08/20/16  Yes Waldron Session, MD  ALPRAZolam Duanne Moron) 0.5 MG tablet Take 0.25 mg by mouth at bedtime as needed for sleep.   Yes [provider]  aspirin EC 81 MG tablet Take 81 mg by mouth daily. Swallow whole.   Yes [provider]  atorvastatin (LIPITOR) 40 MG tablet Take 40 mg by mouth daily.   05/22/17  Yes [provider]  brimonidine (ALPHAGAN) 0.2 % ophthalmic solution Place 1 drop into both eyes 2 (two) times daily. 02/14/16  Yes [provider]  budesonide-formoterol (SYMBICORT) 80-4.5 MCG/ACT inhaler Inhale 2 puffs into the lungs 2 (two) times daily. 12/12/20  Yes Richarda Osmond, MD  busPIRone (BUSPAR) 30 MG tablet Take 30 mg by mouth 2 (two) times daily.   Yes [provider]  citalopram (CELEXA) 20 MG tablet Take 20 mg by mouth daily. 10/28/19  Yes [provider]  clopidogrel (PLAVIX) 75 MG tablet Take 1 tablet (75 mg total) by mouth daily. 11/26/20  Yes Schnier, Dolores Lory, MD  furosemide (LASIX) 20 MG tablet Take 20 mg by mouth daily. 12/14/20  Yes [provider]  latanoprost (XALATAN) 0.005 % ophthalmic solution Place 1 drop into both eyes at bedtime.  06/14/17  Yes [provider]  metoprolol succinate (TOPROL-XL) 25 MG 24 hr tablet Take 12.5 mg by mouth daily.   Yes [provider]  QUEtiapine (SEROQUEL) 25 MG tablet Take 25 mg by mouth at bedtime. 07/15/19  Yes  [provider]  tamsulosin (FLOMAX) 0.4 MG CAPS capsule Take 1 capsule (0.4 mg total) by mouth daily after breakfast. 08/20/16  Yes Waldron Session, MD  traMADol (ULTRAM) 50 MG tablet Take 1 tablet (50 mg total) by mouth every 12 (twelve) hours as needed. 12/12/20  Yes Richarda Osmond, MD  traZODone (DESYREL) 50 MG tablet Take 50 mg by mouth at bedtime. 06/02/19  Yes [provider]   Review of Systems  Constitutional:  Positive for fatigue (easily). Negative for appetite change.  HENT:  Negative for congestion, postnasal drip and sore throat.   Eyes: Negative.   Respiratory:  Positive for shortness of breath. Negative for cough, chest tightness and wheezing.   Cardiovascular:  Positive for palpitations (at times). Negative for chest pain and leg swelling.  Gastrointestinal:  Negative for abdominal distention and abdominal pain.   Endocrine: Negative.   Genitourinary: Negative.   Musculoskeletal:  Negative for back pain and neck pain.  Skin: Negative.   Allergic/Immunologic: Negative.   Neurological:  Negative for dizziness and light-headedness.  Hematological:  Negative for adenopathy. Does not bruise/bleed easily.  Psychiatric/Behavioral:  Negative for dysphoric mood and sleep disturbance (sleeping on 1 pillow). The patient is nervous/anxious.    Vitals:   02/13/21 1324  BP: (!) 134/102  Pulse: 74  Resp: 16  SpO2: 100%  Weight: 121 lb 8 oz (55.1 kg)  Height: 5\' 8"  (1.727 m)   Wt Readings from Last 3 Encounters:  02/13/21 121 lb 8 oz (55.1 kg)  02/09/21 122 lb (55.3 kg)  01/30/21 112 lb 14 oz (51.2 kg)   Lab Results  Component Value Date   CREATININE 0.85 01/28/2021   CREATININE 0.83 01/27/2021   CREATININE 0.82 01/26/2021   Physical Exam Vitals and nursing note reviewed. Exam conducted with a chaperone present (friend).  Constitutional:      General: He is not in acute distress.    Appearance: Normal appearance. He is ill-appearing.  HENT:     Head: Normocephalic and atraumatic.  Neck:     Vascular: JVD present.  Cardiovascular:     Rate and Rhythm: Normal rate and regular rhythm.     Pulses: Normal pulses.     Heart sounds: No murmur heard. Pulmonary:     Effort: Pulmonary effort is normal. Tachypnea present.     Breath sounds: Examination of the right-upper field reveals rhonchi. Examination of the left-upper field reveals rhonchi. Examination of the left-lower field reveals rhonchi. Rhonchi present. No wheezing or rales.  Abdominal:     General: Abdomen is flat.     Palpations: Abdomen is soft.     Tenderness: There is no abdominal tenderness.  Musculoskeletal:        General: No tenderness.     Cervical back: Normal range of motion and neck supple.     Right lower leg: No tenderness. Edema (1+ pitting) present.     Left lower leg: No tenderness. Edema (1+ pitting) present.  Skin:     General: Skin is warm and dry.  Neurological:     General: No focal deficit present.     Mental Status: He is alert and oriented to person, place, and time.  Psychiatric:        Mood and Affect: Mood is anxious.        Behavior: Behavior normal.        Thought Content: Thought content normal.    Assessment & Plan:  1: Chronic heart failure with mildly reduced ejection  fraction with structural changes (LVH)- - NYHA class III - euvolemic today - weighing daily; reminded to call for an overnight weight gain of > 2 pounds or a weekly weight gain of > 5 pounds - weight up 13 pounds (intentionally) in last 2 months - not adding salt but does like saltier foods; likes to eat bacon every morning for breakfast, reviewed making lower sodium choices - sees cardiology (Fath) on 03/21/21 - on GDMT of metoprolol - will d/c lisinopril and start entresto; instructed to not take lisinopril anymore and will start entresto on 1/18 in the morning - 30 day coupon provided for entresto - has just had furosemide 20mg  resumed - BNP 12/09/20 was 1527.4 - consider MRA and SGLT2 in the future, however cost may be prohibitive for SGLT2  2: HTN- - BP 134/104 - saw PCP Ouida Sills) 02/06/21 - BMP 01/26/21 reviewed and showed sodium 134, potassium 3.2, creatinine 0.85 & GFR >60 - will stop lisinopril and start entresto  3: severe COPD- - wearing oxygen 3L HS and during exertion - established care with Dr. Raul Del (pulmonology); new nodule found and work up is in process  4: Tobacco use- - smoking 1.5 ppd of cigarettes but does endorse a desire to quit - has been smoking since the age of 49 - removing his O2 now to smoke  Medication bottles reviewed.   Return in 1 month or sooner for any questions/problems before then.

## 2021-02-13 NOTE — Patient Instructions (Signed)
The [facility] Clinic will be moving around the corner to [id number] mid-February. Our phone number will remain the same.  Stop taking lisinopril, you took a dose this am and that is fine.   Start Entresto on Wednesday morning.   Give coupon to pharmacy (ask pharmacist what they think it will cost). If it will be too much, we can help with patient assistance.  Return in 1 month for f/u labs.

## 2021-02-14 ENCOUNTER — Other Ambulatory Visit: Payer: Self-pay | Admitting: Specialist

## 2021-02-14 DIAGNOSIS — R911 Solitary pulmonary nodule: Secondary | ICD-10-CM

## 2021-02-14 DIAGNOSIS — R918 Other nonspecific abnormal finding of lung field: Secondary | ICD-10-CM

## 2021-03-15 ENCOUNTER — Other Ambulatory Visit: Payer: Self-pay | Admitting: *Deleted

## 2021-03-15 DIAGNOSIS — D649 Anemia, unspecified: Secondary | ICD-10-CM

## 2021-03-16 ENCOUNTER — Ambulatory Visit (HOSPITAL_BASED_OUTPATIENT_CLINIC_OR_DEPARTMENT_OTHER): Payer: Medicare Other | Admitting: Family

## 2021-03-16 ENCOUNTER — Other Ambulatory Visit
Admission: RE | Admit: 2021-03-16 | Discharge: 2021-03-16 | Disposition: A | Discharge status: Home / Self Care | Payer: Medicare Other | Source: Ambulatory Visit | Attending: Family | Admitting: Family

## 2021-03-16 ENCOUNTER — Encounter: Payer: Self-pay | Admitting: Family

## 2021-03-16 ENCOUNTER — Encounter: Payer: Self-pay | Admitting: Pharmacist

## 2021-03-16 ENCOUNTER — Other Ambulatory Visit: Payer: Self-pay

## 2021-03-16 VITALS — BP 154/61 | HR 66 | Resp 18 | Ht 68.0 in | Wt 124.0 lb

## 2021-03-16 DIAGNOSIS — J449 Chronic obstructive pulmonary disease, unspecified: Secondary | ICD-10-CM | POA: Diagnosis not present

## 2021-03-16 DIAGNOSIS — Z9981 Dependence on supplemental oxygen: Secondary | ICD-10-CM | POA: Insufficient documentation

## 2021-03-16 DIAGNOSIS — F17210 Nicotine dependence, cigarettes, uncomplicated: Secondary | ICD-10-CM | POA: Insufficient documentation

## 2021-03-16 DIAGNOSIS — I5022 Chronic systolic (congestive) heart failure: Secondary | ICD-10-CM | POA: Insufficient documentation

## 2021-03-16 DIAGNOSIS — Z79899 Other long term (current) drug therapy: Secondary | ICD-10-CM | POA: Insufficient documentation

## 2021-03-16 DIAGNOSIS — F419 Anxiety disorder, unspecified: Secondary | ICD-10-CM | POA: Insufficient documentation

## 2021-03-16 DIAGNOSIS — R450 Nervousness: Secondary | ICD-10-CM | POA: Insufficient documentation

## 2021-03-16 DIAGNOSIS — I110 Hypertensive heart disease with heart failure: Secondary | ICD-10-CM | POA: Insufficient documentation

## 2021-03-16 DIAGNOSIS — Z720 Tobacco use: Secondary | ICD-10-CM

## 2021-03-16 DIAGNOSIS — I252 Old myocardial infarction: Secondary | ICD-10-CM | POA: Insufficient documentation

## 2021-03-16 DIAGNOSIS — I739 Peripheral vascular disease, unspecified: Secondary | ICD-10-CM | POA: Insufficient documentation

## 2021-03-16 DIAGNOSIS — I509 Heart failure, unspecified: Secondary | ICD-10-CM | POA: Diagnosis not present

## 2021-03-16 DIAGNOSIS — R918 Other nonspecific abnormal finding of lung field: Secondary | ICD-10-CM | POA: Insufficient documentation

## 2021-03-16 DIAGNOSIS — I10 Essential (primary) hypertension: Secondary | ICD-10-CM | POA: Diagnosis not present

## 2021-03-16 DIAGNOSIS — J439 Emphysema, unspecified: Secondary | ICD-10-CM | POA: Insufficient documentation

## 2021-03-16 DIAGNOSIS — I2510 Atherosclerotic heart disease of native coronary artery without angina pectoris: Secondary | ICD-10-CM | POA: Insufficient documentation

## 2021-03-16 LAB — BASIC METABOLIC PANEL
Anion gap: 6 (ref 5–15)
BUN: 12 mg/dL (ref 8–23)
CO2: 25 mmol/L (ref 22–32)
Calcium: 8.8 mg/dL — ABNORMAL LOW (ref 8.9–10.3)
Chloride: 104 mmol/L (ref 98–111)
Creatinine, Ser: 1.09 mg/dL (ref 0.61–1.24)
GFR, Estimated: >60 mL/min (ref >60)
Glucose, Bld: 102 mg/dL — ABNORMAL HIGH (ref 70–99)
Potassium: 3.6 mmol/L (ref 3.5–5.1)
Sodium: 135 mmol/L (ref 135–145)

## 2021-03-16 MED ORDER — SACUBITRIL-VALSARTAN 49-51 MG PO TABS: 1.0000 | ORAL_TABLET | Freq: Two times a day (BID) | ORAL | 5 refills | Status: DC

## 2021-03-16 NOTE — Progress Notes (Signed)
Patient ID: Todd Whitehead, male    DOB: October 18, 1945, 76 y.o.   MRN: [id number]   Mr Barajas is a 76 y/o male with a history of HTN, anxiety, COPD, CAD (MI), PVD, current tobacco use and chronic heart failure.   Echo report from 01/27/21 Left ventricular ejection fraction, by estimation, is 45 to 50%. The left ventricle has mildly decreased function. The left ventricle demonstrates global hypokinesis. There is mild left ventricular hypertrophy of the basal-septal segment. Left ventricular diastolic parameters are consistent with Grade I diastolic dysfunction  LHC done 08/19/15 and showed: Mid LAD lesion, 80% stenosed. Mid Cx lesion, 85% stenosed. Mid RCA lesion, 100% stenosed. LM lesion, 50% stenosed. 3 vessel cad. Mildly reduced lv funciton  Admitted 01/26/21 due to acute on chronic CHF and CAP. New RLL mass found. Admitted 12/09/20 due to HF exacerbation. Initially given IV lasix with transition to oral diuretics. Needed supplemental oxygen. Chest CT showed severe emphysema signs of aspiration. Cardiology consult obtained. Discharged after 3 days on oxygen.    He presents today for follow up visit with a chief complaint of moderate shortness of breath with little exertion. Says that this has been present for several years, but is slightly improved since his last visit to the HF clinic.  He has associated fatigue, palpitations and anxiety along with this. He denies any dizziness, difficulty sleeping, abdominal distention, pedal edema, chest pain, wheezing, cough or weight gain.   He is feeling much better since his last visit to the HF clinic. He intentionally has gained 3 additional lbs and his appetite is increasing. He has oxygen that he uses on exertion and at HS. Continues to smoke 1.5 ppd of cigarettes.  Past Medical History:  Diagnosis Date   Anxiety    Arthritis    CHF (congestive heart failure) (HCC)    Colon polyp    COPD (chronic obstructive pulmonary disease) ([place])    smoker    Dysrhythmia    Glaucoma    Heart murmur    Hypertension    on  no meds   Low serum vitamin B12 11/18/2020   Melanoma ([place])    Resected from Left upper arm.    Myocardial infarction [facility]) 2017   Peripheral vascular disease [facility])    Prostate enlargement    Past Surgical History:  Procedure Laterality Date   BACK SURGERY  01/19/98   CARDIAC CATHETERIZATION N/A 08/19/2015   Procedure: Left Heart Cath and Coronary Angiography;  Surgeon: [name], MD;  Location: [place] CV LAB;  Service: Cardiovascular;  Laterality: N/A;   CHOLECYSTECTOMY N/A 08/19/2016   Procedure: LAPAROSCOPIC CHOLECYSTECTOMY WITH INTRAOPERATIVE CHOLANGIOGRAM;  Surgeon: [name], MD;  Location: [place];  Service: General;  Laterality: N/A;   COLON SURGERY  age 56 days old   " bowel was twisted up"   COLONOSCOPY WITH PROPOFOL N/A 03/19/2016   Procedure: COLONOSCOPY WITH PROPOFOL;  Surgeon: [name], MD;  Location: [facility] ENDOSCOPY;  Service: Endoscopy;  Laterality: N/A;   CORONARY ARTERY BYPASS GRAFT  2017   ERCP N/A 08/18/2016   Procedure: ENDOSCOPIC RETROGRADE CHOLANGIOPANCREATOGRAPHY (ERCP);  Surgeon: [name], MD;  Location: [place];  Service: Gastroenterology;  Laterality: N/A;   ESOPHAGOGASTRODUODENOSCOPY (EGD) WITH PROPOFOL N/A 09/18/2018   Procedure: ESOPHAGOGASTRODUODENOSCOPY (EGD) WITH PROPOFOL;  Surgeon: [name], DO;  Location: [place];  Service: General;  Laterality: N/A;   EYE SURGERY Left 03/26/11   LOWER EXTREMITY ANGIOGRAPHY Left 10/03/2017   Procedure: LOWER EXTREMITY ANGIOGRAPHY;  Surgeon: [name], MD;  Location: [place] CV LAB;  Service: Cardiovascular;  Laterality: Left;   LOWER EXTREMITY ANGIOGRAPHY Right 10/31/2017   Procedure: LOWER EXTREMITY ANGIOGRAPHY;  Surgeon: [name], MD;  Location: [place] CV [facility];  Service: Cardiovascular;  Laterality: Right;   LUMBAR LAMINECTOMY/DECOMPRESSION MICRODISCECTOMY Right 10/02/2012   Procedure: Right Lumbar  Five-Sacral One Lumbar laminotomy/Microdiskectomy;  Surgeon: [name], MD;  Location: [place] NEURO ORS;  Service: Neurosurgery;  Laterality: Right;  Right Lumbar Five-Sacral One Lumbar laminotomy/Microdiskectomy   VISCERAL ANGIOGRAPHY N/A 11/08/2020   Procedure: VISCERAL ANGIOGRAPHY;  Surgeon: Katha Cabal, MD;  Location: [place] CV LAB;  Service: Cardiovascular;  Laterality: N/A;   VISCERAL ANGIOGRAPHY N/A 11/25/2020   Procedure: VISCERAL ANGIOGRAPHY;  Surgeon: Katha Cabal, MD;  Location: [place] CV LAB;  Service: Cardiovascular;  Laterality: N/A;   Family History  Problem Relation Age of Onset   Heart attack Father    Aneurysm Brother    Lung cancer Brother    Social History   Tobacco Use   Smoking status: Every Day    Packs/day: 1.50    Years: 58.00    Pack years: 87.00    Types: Cigarettes   Smokeless tobacco: Never  Substance Use Topics   Alcohol use: No   Allergies  Allergen Reactions   Other Nausea And Vomiting    Cheese,butter,sour cream,cottage cheese   Poison Ivy Extract Rash   Poison Oak Extract Rash   Prior to Admission medications   Medication Sig Start Date End Date Taking? Authorizing Provider  acetaminophen (TYLENOL) 500 MG tablet Take 1 tablet (500 mg total) by mouth every 6 (six) hours as needed. Patient taking differently: Take 500 mg by mouth every 6 (six) hours as needed for moderate pain or headache. 08/20/16  Yes Waldron Session, MD  ALPRAZolam Duanne Moron) 0.5 MG tablet Take 0.25 mg by mouth at bedtime as needed for sleep.   Yes [provider]  aspirin EC 81 MG tablet Take 81 mg by mouth daily. Swallow whole.   Yes [provider]  atorvastatin (LIPITOR) 40 MG tablet Take 40 mg by mouth daily.  05/22/17  Yes [provider]  brimonidine (ALPHAGAN) 0.2 % ophthalmic solution Place 1 drop into both eyes 2 (two) times daily. 02/14/16  Yes [provider]  budesonide-formoterol (SYMBICORT) 80-4.5 MCG/ACT  inhaler Inhale 2 puffs into the lungs 2 (two) times daily. 12/12/20  Yes Richarda Osmond, MD  busPIRone (BUSPAR) 30 MG tablet Take 30 mg by mouth 2 (two) times daily.   Yes [provider]  citalopram (CELEXA) 20 MG tablet Take 20 mg by mouth daily. 10/28/19  Yes [provider]  clopidogrel (PLAVIX) 75 MG tablet Take 1 tablet (75 mg total) by mouth daily. 11/26/20  Yes Schnier, Dolores Lory, MD  furosemide (LASIX) 20 MG tablet Take 20 mg by mouth daily. 12/14/20  Yes [provider]  latanoprost (XALATAN) 0.005 % ophthalmic solution Place 1 drop into both eyes at bedtime.  06/14/17  Yes [provider]  metoprolol succinate (TOPROL-XL) 25 MG 24 hr tablet Take 12.5 mg by mouth daily.   Yes [provider]  QUEtiapine (SEROQUEL) 25 MG tablet Take 25 mg by mouth at bedtime. 07/15/19  Yes [provider]  tamsulosin (FLOMAX) 0.4 MG CAPS capsule Take 1 capsule (0.4 mg total) by mouth daily after breakfast. 08/20/16  Yes Waldron Session, MD  traMADol (ULTRAM) 50 MG tablet Take 1 tablet (50 mg  total) by mouth every 12 (twelve) hours as needed. 12/12/20  Yes Richarda Osmond, MD  traZODone (DESYREL) 50 MG tablet Take 50 mg by mouth at bedtime. 06/02/19  Yes [provider]   Review of Systems  Constitutional:  Positive for fatigue (improving). Negative for appetite change.  HENT:  Negative for congestion, postnasal drip and sore throat.   Eyes: Negative.   Respiratory:  Positive for shortness of breath. Negative for cough, chest tightness and wheezing.   Cardiovascular:  Positive for palpitations (at times). Negative for chest pain and leg swelling.  Gastrointestinal:  Negative for abdominal distention and abdominal pain.  Endocrine: Negative.   Genitourinary: Negative.   Musculoskeletal:  Negative for back pain and neck pain.  Skin: Negative.   Allergic/Immunologic: Negative.   Neurological:  Negative for dizziness and light-headedness.   Hematological:  Negative for adenopathy. Does not bruise/bleed easily.  Psychiatric/Behavioral:  Negative for dysphoric mood and sleep disturbance (sleeping on 1 pillow). The patient is nervous/anxious.    Vitals:   03/16/21 1039  BP: (!) 154/61  Pulse: 66  Resp: 18  SpO2: 99%  Weight: 124 lb (56.2 kg)  Height: 5\' 8"  (1.727 m)   Wt Readings from Last 3 Encounters:  03/16/21 124 lb (56.2 kg)  02/13/21 121 lb 8 oz (55.1 kg)  02/09/21 122 lb (55.3 kg)   Lab Results  Component Value Date   CREATININE 0.85 01/28/2021   CREATININE 0.83 01/27/2021   CREATININE 0.82 01/26/2021   Physical Exam Vitals and nursing note reviewed. Exam conducted with a chaperone present (friend).  Constitutional:      General: He is not in acute distress.    Appearance: Normal appearance. He is ill-appearing. He is not toxic-appearing.  HENT:     Head: Normocephalic and atraumatic.  Neck:     Vascular: JVD present.  Cardiovascular:     Rate and Rhythm: Normal rate and regular rhythm.     Pulses: Normal pulses.     Heart sounds: No murmur heard. Pulmonary:     Effort: Pulmonary effort is normal. No tachypnea.     Breath sounds: Wheezing present. No rhonchi or rales.  Abdominal:     General: Abdomen is flat.     Palpations: Abdomen is soft.     Tenderness: There is no abdominal tenderness.  Musculoskeletal:        General: No tenderness.     Cervical back: Normal range of motion and neck supple.     Right lower leg: No tenderness. Edema (1+ pitting) present.     Left lower leg: No tenderness. Edema (1+ pitting) present.  Skin:    General: Skin is warm and dry.     Nails: There is clubbing.  Neurological:     General: No focal deficit present.     Mental Status: He is alert and oriented to person, place, and time.  Psychiatric:        Mood and Affect: Mood is anxious.        Behavior: Behavior normal.        Thought Content: Thought content normal.    Assessment & Plan:  1: Chronic  heart failure with mildly reduced ejection fraction with structural changes (LVH)- - NYHA class II - euvolemic today - weighing daily; reminded to call for an overnight weight gain of > 2 pounds or a weekly weight gain of > 5 pounds - weight 124 today, up 3 lbs since last visit intentionally - not adding salt  but does like saltier foods; likes to eat bacon every morning for breakfast, reviewed making lower sodium choices - sees cardiology (Fath) on 03/21/21 - on GDMT of metoprolol & entresto - entresto started at last visit, will check BMP today and if ok, will titrate up to 49-51 mg - 30 day coupon provided for entresto again as pharmacy did not accept last time - BNP 12/09/20 was 1527.4 - has been taking lasix PRN as opposed to QD, advised that is ok to continue and parameters for when to take PRN dose - consider MRA and SGLT2 in the future, however cost may be prohibitive for SGLT2  2: HTN- - BP 156/61, visibly anxious  - saw PCP Ouida Sills) 02/06/21 - BMP 01/28/21 reviewed and showed sodium 134, potassium 3.2, creatinine 0.85 & GFR >60  3: severe COPD- - wearing oxygen 3L HS and during exertion, reports his breathing is somewhat better today  - saw pulmonology Raul Del) 02/14/21, returns on 05/03/21 for pulmonary nodules discovered at last admission  4: Tobacco use- - smoking 1.5 ppd of cigarettes, does not want to discuss cessation today - has been smoking since the age of 20  Medication bottles reviewed.   Return in 1 month or sooner for any questions/problems before then.

## 2021-03-16 NOTE — Progress Notes (Signed)
[facility] - PHARMACIST COUNSELING NOTE  *HFmEF*  Guideline-Directed Medical Therapy/Evidence Based Medicine  ACE/ARB/ARNI: Sacubitril-valsartan 49-51 mg twice daily Beta Blocker: Metoprolol succinate 12.5 mg daily Aldosterone Antagonist:  n/a Diuretic:  n/a SGLT2i:  n/a - patient preference  Adherence Assessment  Do you ever forget to take your medication? [] Yes [x] No  Do you ever skip doses due to side effects? [] Yes [x] No  Do you have trouble affording your medicines? [] Yes [x] No  Are you ever unable to pick up your medication due to transportation difficulties? [] Yes [x] No  Do you ever stop taking your medications because you don't believe they are helping? [] Yes [x] No  Do you check your weight daily? [x] Yes [] No   Adherence strategy: pill box  Barriers to obtaining medications: none  Vital signs: HR 66, BP 154/61, weight (pounds) 124 lb  ECHO: Date 01/27/21, EF 45-50%, the left ventricle has mildly decreased function. The left ventricle demonstrates global hypokinesis.  BMP Latest Ref Rng & Units 03/16/2021 01/28/2021 01/27/2021  Glucose 70 - 99 mg/dL 102(H) 108(H) 111(H)  BUN 8 - 23 mg/dL 12 11 14   Creatinine 0.61 - 1.24 mg/dL 1.09 0.85 0.83  BUN/Creat Ratio 10 - 24 - - -  Sodium 135 - 145 mmol/L 135 134(L) 136  Potassium 3.5 - 5.1 mmol/L 3.6 3.2(L) 3.6  Chloride 98 - 111 mmol/L 104 94(L) 94(L)  CO2 22 - 32 mmol/L 25 33(H) 33(H)  Calcium 8.9 - 10.3 mg/dL 8.8(L) 8.5(L) 8.6(L)    Past Medical History:  Diagnosis Date   Anxiety    Arthritis    CHF (congestive heart failure) (HCC)    Colon polyp    COPD (chronic obstructive pulmonary disease) (HCC)    smoker   Dysrhythmia    Glaucoma    Heart murmur    Hypertension    on  no meds   Low serum vitamin B12 11/18/2020   Melanoma ([place])    Resected from Left upper arm.    Myocardial infarction [facility]) 2017   Peripheral vascular disease [facility])    Prostate enlargement      ASSESSMENT 76 year old male who presents to the HF clinic for follow up. HTN, anxiety, COPD, CAD (MI), PVD, current tobacco use and chronic heart failure.   Patient reports compliance with current medication and denies ADR, dizziness, chest pain, or lightheadedness. He feels comfortable with titration of current medication, but will prefer avoid adding more meds to regimen. No BMTE done since transition form lisinopril to Entresto.  Recent ED Visit (past 6 months): Date - 01/26/21, CC - COPD  PLAN - Increase Entresto to 49/51mg  BID f Scr and serum K remains stable - Increase metoprolol succinate to 25mg  daily. - Continue to follow up and titrate medication as needed.  Time spent: 10 minutes  Dilara Navarrete Rodriguez-Guzman PharmD, BCPS 03/16/2021 2:41 PM   Current Outpatient Medications:    acetaminophen (TYLENOL) 500 MG tablet, Take 1 tablet (500 mg total) by mouth every 6 (six) hours as needed. (Patient taking differently: Take 500 mg by mouth every 6 (six) hours as needed for moderate pain or headache.), Disp: 30 tablet, Rfl: 0   albuterol (PROVENTIL) (2.5 MG/3ML) 0.083% nebulizer solution, Take 3 mLs (2.5 mg total) by nebulization every 6 (six) hours as needed for wheezing or shortness of breath., Disp: 75 mL, Rfl: 12   ALPRAZolam (XANAX) 0.5 MG tablet, Take 0.25 mg by mouth daily as needed for sleep or anxiety., Disp: , Rfl:  aspirin EC 81 MG tablet, Take 81 mg by mouth daily. Swallow whole., Disp: , Rfl:    atorvastatin (LIPITOR) 40 MG tablet, Take 40 mg by mouth daily. , Disp: , Rfl:    brimonidine (ALPHAGAN) 0.2 % ophthalmic solution, Place 1 drop into both eyes 2 (two) times daily., Disp: , Rfl:    budesonide-formoterol (SYMBICORT) 80-4.5 MCG/ACT inhaler, Inhale 2 puffs into the lungs 2 (two) times daily., Disp: 1 each, Rfl: 12   busPIRone (BUSPAR) 30 MG tablet, Take 30 mg by mouth 2 (two) times daily., Disp: , Rfl:    citalopram (CELEXA) 20 MG tablet, Take 20 mg by mouth daily.,  Disp: , Rfl:    clopidogrel (PLAVIX) 75 MG tablet, Take 1 tablet (75 mg total) by mouth daily., Disp: 30 tablet, Rfl: 5   ferrous sulfate 325 (65 FE) MG EC tablet, Take 1 tablet (325 mg total) by mouth 2 (two) times daily with a meal., Disp: 60 tablet, Rfl: 3   latanoprost (XALATAN) 0.005 % ophthalmic solution, Place 1 drop into both eyes at bedtime. , Disp: , Rfl: 2   metoprolol succinate (TOPROL-XL) 25 MG 24 hr tablet, Take 12.5 mg by mouth daily., Disp: , Rfl:    Multiple Vitamin (MULTIVITAMIN WITH MINERALS) TABS tablet, Take 1 tablet by mouth daily., Disp: 30 tablet, Rfl: 0   QUEtiapine (SEROQUEL) 25 MG tablet, Take 1 tablet (25 mg total) by mouth at bedtime., Disp: 30 tablet, Rfl: 0   sacubitril-valsartan (ENTRESTO) 49-51 MG, Take 1 tablet by mouth 2 (two) times daily., Disp: 60 tablet, Rfl: 5   tamsulosin (FLOMAX) 0.4 MG CAPS capsule, Take 1 capsule (0.4 mg total) by mouth daily after breakfast., Disp: 30 capsule, Rfl: 2   traMADol (ULTRAM) 50 MG tablet, Take 1 tablet (50 mg total) by mouth every 12 (twelve) hours as needed., Disp: 30 tablet, Rfl: 2   traZODone (DESYREL) 50 MG tablet, Take 50 mg by mouth at bedtime., Disp: , Rfl:    MEDICATION ADHERENCES TIPS AND STRATEGIES Taking medication as prescribed improves patient outcomes in heart failure (reduces hospitalizations, improves symptoms, increases survival) Side effects of medications can be managed by decreasing doses, switching agents, stopping drugs, or adding additional therapy. Please let someone in the [facility] know if you have having bothersome side effects so we can modify your regimen. Do not alter your medication regimen without talking to [place].  Medication reminders can help patients remember to take drugs on time. If you are missing or forgetting doses you can try linking behaviors, using pill boxes, or an electronic reminder like an alarm on your phone or an app. Some people can also get automated phone calls as  medication reminders.

## 2021-03-16 NOTE — Patient Instructions (Signed)
Finish your current bottle of entresto.  When you pick up your next entresto, it will be at the higher dose, but you will still take twice/day.  You can start taking your lasix (fluid pill), just as needed. If you gain 2-3lbs/night, take your fluid pill.  Prop your feet up when you can, that will help with the fluid in your ankles/feet.  We will check you labs today and call you with results.

## 2021-03-23 ENCOUNTER — Inpatient Hospital Stay: Payer: Medicare Other | Attending: Oncology

## 2021-03-23 ENCOUNTER — Other Ambulatory Visit: Payer: Self-pay

## 2021-03-23 ENCOUNTER — Ambulatory Visit (INDEPENDENT_AMBULATORY_CARE_PROVIDER_SITE_OTHER): Payer: Medicare Other

## 2021-03-23 ENCOUNTER — Other Ambulatory Visit (INDEPENDENT_AMBULATORY_CARE_PROVIDER_SITE_OTHER): Payer: Self-pay | Admitting: Vascular Surgery

## 2021-03-23 ENCOUNTER — Ambulatory Visit (INDEPENDENT_AMBULATORY_CARE_PROVIDER_SITE_OTHER): Payer: Medicare Other | Admitting: Vascular Surgery

## 2021-03-23 ENCOUNTER — Encounter (INDEPENDENT_AMBULATORY_CARE_PROVIDER_SITE_OTHER): Payer: Self-pay | Admitting: Vascular Surgery

## 2021-03-23 VITALS — BP 138/68 | HR 77 | Resp 15 | Ht 68.0 in | Wt 120.8 lb

## 2021-03-23 DIAGNOSIS — D649 Anemia, unspecified: Secondary | ICD-10-CM

## 2021-03-23 DIAGNOSIS — I739 Peripheral vascular disease, unspecified: Secondary | ICD-10-CM | POA: Diagnosis not present

## 2021-03-23 DIAGNOSIS — Z959 Presence of cardiac and vascular implant and graft, unspecified: Secondary | ICD-10-CM | POA: Diagnosis not present

## 2021-03-23 DIAGNOSIS — R911 Solitary pulmonary nodule: Secondary | ICD-10-CM | POA: Diagnosis not present

## 2021-03-23 DIAGNOSIS — I771 Stricture of artery: Secondary | ICD-10-CM

## 2021-03-23 DIAGNOSIS — I779 Disorder of arteries and arterioles, unspecified: Secondary | ICD-10-CM | POA: Diagnosis not present

## 2021-03-23 DIAGNOSIS — K551 Chronic vascular disorders of intestine: Secondary | ICD-10-CM

## 2021-03-23 DIAGNOSIS — E538 Deficiency of other specified B group vitamins: Secondary | ICD-10-CM | POA: Insufficient documentation

## 2021-03-23 DIAGNOSIS — I10 Essential (primary) hypertension: Secondary | ICD-10-CM

## 2021-03-23 DIAGNOSIS — D472 Monoclonal gammopathy: Secondary | ICD-10-CM | POA: Diagnosis not present

## 2021-03-23 DIAGNOSIS — I25118 Atherosclerotic heart disease of native coronary artery with other forms of angina pectoris: Secondary | ICD-10-CM | POA: Diagnosis not present

## 2021-03-23 DIAGNOSIS — E785 Hyperlipidemia, unspecified: Secondary | ICD-10-CM

## 2021-03-23 LAB — IRON AND TIBC
Iron: 72 ug/dL (ref 45–182)
Saturation Ratios: 21 % (ref 17.9–39.5)
TIBC: 337 ug/dL (ref 250–450)
UIBC: 265 ug/dL

## 2021-03-23 LAB — FERRITIN: Ferritin: 49 ng/mL (ref 24–336)

## 2021-03-23 LAB — CBC WITH DIFFERENTIAL/PLATELET
Abs Immature Granulocytes: 0.05 10*3/uL (ref 0.00–0.07)
Basophils Absolute: 0 10*3/uL (ref 0.0–0.1)
Basophils Relative: 1 %
Eosinophils Absolute: 0.1 10*3/uL (ref 0.0–0.5)
Eosinophils Relative: 1 %
HCT: 29 % — ABNORMAL LOW (ref 39.0–52.0)
Hemoglobin: 9.7 g/dL — ABNORMAL LOW (ref 13.0–17.0)
Immature Granulocytes: 1 %
Lymphocytes Relative: 26 %
Lymphs Abs: 1.6 10*3/uL (ref 0.7–4.0)
MCH: 31.2 pg (ref 26.0–34.0)
MCHC: 33.4 g/dL (ref 30.0–36.0)
MCV: 93.2 fL (ref 80.0–100.0)
Monocytes Absolute: 0.4 10*3/uL (ref 0.1–1.0)
Monocytes Relative: 7 %
Neutro Abs: 3.8 10*3/uL (ref 1.7–7.7)
Neutrophils Relative %: 64 %
Platelets: 170 10*3/uL (ref 150–400)
RBC: 3.11 MIL/uL — ABNORMAL LOW (ref 4.22–5.81)
RDW: 17 % — ABNORMAL HIGH (ref 11.5–15.5)
WBC: 6 10*3/uL (ref 4.0–10.5)
nRBC: 0 % (ref 0.0–0.2)

## 2021-03-23 LAB — SAMPLE TO BLOOD BANK

## 2021-03-23 NOTE — Progress Notes (Signed)
MRN : [id number]  Todd Whitehead is a 76 y.o. (08/21/1945) male who presents with chief complaint of check stents.  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    Procedure 11/25/2020: Stent to the celiac artery origin with 6 mm diameter x 37 mm length balloon expandable lifestream stent postdilated to 7 mm   The patient denies abdominal pain or postprandial symptoms.  The patient denies weight loss as well as nausea.  The patient does not substantiate food fear, particular foods do not seem to aggravate or alleviate the symptoms.  The patient denies bloody bowel movements or diarrhea.  The patient has a history of colonoscopy which was not diagnostic.  No history of peptic ulcer disease.   No prior peripheral angiograms or vascular interventions.  The patient denies amaurosis fugax or recent TIA symptoms. There are no recent neurological changes noted. The patient denies claudication symptoms or rest pain symptoms. The patient denies history of DVT, PE or superficial thrombophlebitis. The patient denies recent episodes of angina    Current Meds  Medication Sig   acetaminophen (TYLENOL) 500 MG tablet Take 1 tablet (500 mg total) by mouth every 6 (six) hours as needed. (Patient taking differently: Take 500 mg by mouth every 6 (six) hours as needed for moderate pain or headache.)   albuterol (PROVENTIL) (2.5 MG/3ML) 0.083% nebulizer solution Take 3 mLs (2.5 mg total) by nebulization every 6 (six) hours as needed for wheezing or shortness of breath.   ALPRAZolam (XANAX) 0.5 MG tablet Take 0.25 mg by mouth daily as needed for sleep or anxiety.   aspirin EC 81 MG tablet Take 81 mg by mouth daily. Swallow whole.   atorvastatin (LIPITOR) 40 MG tablet Take 40 mg by mouth daily.    brimonidine (ALPHAGAN) 0.2 % ophthalmic solution Place 1 drop into both eyes 2 (two)  times daily.   budesonide-formoterol (SYMBICORT) 80-4.5 MCG/ACT inhaler Inhale 2 puffs into the lungs 2 (two) times daily.   busPIRone (BUSPAR) 30 MG tablet Take 30 mg by mouth 2 (two) times daily.   citalopram (CELEXA) 20 MG tablet Take 20 mg by mouth daily.   clopidogrel (PLAVIX) 75 MG tablet Take 1 tablet (75 mg total) by mouth daily.   ferrous sulfate 325 (65 FE) MG EC tablet Take 1 tablet (325 mg total) by mouth 2 (two) times daily with a meal.   latanoprost (XALATAN) 0.005 % ophthalmic solution Place 1 drop into both eyes at bedtime.    metoprolol succinate (TOPROL-XL) 25 MG 24 hr tablet Take 12.5 mg by mouth daily.   Multiple Vitamin (MULTIVITAMIN WITH MINERALS) TABS tablet Take 1 tablet by mouth daily.   QUEtiapine (SEROQUEL) 25 MG tablet Take 1 tablet (25 mg total) by mouth at bedtime.   sacubitril-valsartan (ENTRESTO) 49-51 MG Take 1 tablet by mouth 2 (two) times daily.   tamsulosin (FLOMAX) 0.4 MG CAPS capsule Take 1 capsule (0.4 mg total) by mouth daily after breakfast.   traMADol (ULTRAM) 50 MG tablet Take 1 tablet (50 mg total) by mouth every 12 (twelve) hours as needed.   traZODone (DESYREL) 50 MG tablet Take 50 mg by mouth at bedtime.    Past Medical History:  Diagnosis Date   Anxiety    Arthritis    CHF (congestive heart failure) (HCC)    Colon polyp    COPD (chronic obstructive pulmonary disease) ([place])  smoker   Dysrhythmia    Glaucoma    Heart murmur    Hypertension    on  no meds   Low serum vitamin B12 11/18/2020   Melanoma ([place])    Resected from Left upper arm.    Myocardial infarction [facility]) 2017   Peripheral vascular disease [facility])    Prostate enlargement     Past Surgical History:  Procedure Laterality Date   BACK SURGERY  01/19/98   CARDIAC CATHETERIZATION N/A 08/19/2015   Procedure: Left Heart Cath and Coronary Angiography;  Surgeon: [name], MD;  Location: [place] CV LAB;  Service: Cardiovascular;  Laterality: N/A;   CHOLECYSTECTOMY  N/A 08/19/2016   Procedure: LAPAROSCOPIC CHOLECYSTECTOMY WITH INTRAOPERATIVE CHOLANGIOGRAM;  Surgeon: [name], MD;  Location: [place];  Service: General;  Laterality: N/A;   COLON SURGERY  age 16 days old   " bowel was twisted up"   COLONOSCOPY WITH PROPOFOL N/A 03/19/2016   Procedure: COLONOSCOPY WITH PROPOFOL;  Surgeon: [name], MD;  Location: [facility] ENDOSCOPY;  Service: Endoscopy;  Laterality: N/A;   CORONARY ARTERY BYPASS GRAFT  2017   ERCP N/A 08/18/2016   Procedure: ENDOSCOPIC RETROGRADE CHOLANGIOPANCREATOGRAPHY (ERCP);  Surgeon: [name], MD;  Location: [place];  Service: Gastroenterology;  Laterality: N/A;   ESOPHAGOGASTRODUODENOSCOPY (EGD) WITH PROPOFOL N/A 09/18/2018   Procedure: ESOPHAGOGASTRODUODENOSCOPY (EGD) WITH PROPOFOL;  Surgeon: [name], DO;  Location: [place];  Service: General;  Laterality: N/A;   EYE SURGERY Left 03/26/11   LOWER EXTREMITY ANGIOGRAPHY Left 10/03/2017   Procedure: LOWER EXTREMITY ANGIOGRAPHY;  Surgeon: [name], MD;  Location: [place] CV LAB;  Service: Cardiovascular;  Laterality: Left;   LOWER EXTREMITY ANGIOGRAPHY Right 10/31/2017   Procedure: LOWER EXTREMITY ANGIOGRAPHY;  Surgeon: [name], MD;  Location: [place] CV LAB;  Service: Cardiovascular;  Laterality: Right;   LUMBAR LAMINECTOMY/DECOMPRESSION MICRODISCECTOMY Right 10/02/2012   Procedure: Right Lumbar Five-Sacral One Lumbar laminotomy/Microdiskectomy;  Surgeon: [name], MD;  Location: [place] NEURO ORS;  Service: Neurosurgery;  Laterality: Right;  Right Lumbar Five-Sacral One Lumbar laminotomy/Microdiskectomy   VISCERAL ANGIOGRAPHY N/A 11/08/2020   Procedure: VISCERAL ANGIOGRAPHY;  Surgeon: Katha Cabal, MD;  Location: [place] CV LAB;  Service: Cardiovascular;  Laterality: N/A;   VISCERAL ANGIOGRAPHY N/A 11/25/2020   Procedure: VISCERAL ANGIOGRAPHY;  Surgeon: Katha Cabal, MD;  Location: [place] CV LAB;  Service: Cardiovascular;   Laterality: N/A;    Social History Social History   Tobacco Use   Smoking status: Every Day    Packs/day: 1.50    Years: 58.00    Pack years: 87.00    Types: Cigarettes   Smokeless tobacco: Never  Vaping Use   Vaping Use: Former  Substance Use Topics   Alcohol use: No   Drug use: No    Family History Family History  Problem Relation Age of Onset   Heart attack Father    Aneurysm Brother    Lung cancer Brother     Allergies  Allergen Reactions   Other Nausea And Vomiting    Cheese,butter,sour cream,cottage cheese   Poison Ivy Extract Rash   Poison Oak Extract Rash     REVIEW OF SYSTEMS (Negative unless checked)  Constitutional: [] Weight loss  [] Fever  [] Chills Cardiac: [] Chest pain   [] Chest pressure   [] Palpitations   [] Shortness of breath when laying flat   [] Shortness of breath with exertion. Vascular:  [x] Pain in legs with walking   [] Pain in legs at rest  []   History of DVT   [] Phlebitis   [] Swelling in legs   [] Varicose veins   [] Non-healing ulcers Pulmonary:   [] Uses home oxygen   [] Productive cough   [] Hemoptysis   [] Wheeze  [x] COPD   [] Asthma Neurologic:  [] Dizziness   [] Seizures   [] History of stroke   [] History of TIA  [] Aphasia   [] Vissual changes   [] Weakness or numbness in arm   [] Weakness or numbness in leg Musculoskeletal:   [] Joint swelling   [] Joint pain   [] Low back pain Hematologic:  [] Easy bruising  [] Easy bleeding   [] Hypercoagulable state   [] Anemic Gastrointestinal:  [] Diarrhea   [] Vomiting  [] Gastroesophageal reflux/heartburn   [] Difficulty swallowing. Genitourinary:  [] Chronic kidney disease   [] Difficult urination  [] Frequent urination   [] Blood in urine Skin:  [] Rashes   [] Ulcers  Psychological:  [] History of anxiety   []  History of major depression.  Physical Examination  Vitals:   03/23/21 0822  BP: 138/68  Pulse: 77  Resp: 15  Weight: 120 lb 12.8 oz (54.8 kg)  Height: 5\' 8"  (1.727 m)   Body mass index is 18.37 kg/m. Gen:  WD/WN, NAD Head: [place]/AT, No temporalis wasting.  Ear/Nose/Throat: Hearing grossly intact, nares w/o erythema or drainage Eyes: PER, EOMI, sclera nonicteric.  Neck: Supple, no masses.  No bruit or JVD.  Pulmonary:  Good air movement, no audible wheezing, no use of accessory muscles.  Cardiac: RRR, normal S1, S2, no Murmurs. Vascular:   Vessel Right Left  Radial Palpable Palpable  Carotid Palpable Palpable  PT Not Palpable Not Palpable  DP Not Palpable Not Palpable  Gastrointestinal: soft, non-distended. No guarding/no peritoneal signs.  Musculoskeletal: M/S 5/5 throughout.  No visible deformity.  Neurologic: CN 2-12 intact. Pain and light touch intact in extremities.  Symmetrical.  Speech is fluent. Motor exam as listed above. Psychiatric: Judgment intact, Mood & affect appropriate for pt's clinical situation. Dermatologic: No rashes or ulcers noted.  No changes consistent with cellulitis.   CBC Lab Results  Component Value Date   WBC 5.6 02/08/2021   HGB 7.4 (L) 02/08/2021   HCT 22.5 (L) 02/08/2021   MCV 92.2 02/08/2021   PLT 207 02/08/2021    BMET    Component Value Date/Time   NA 135 03/16/2021 1124   NA 138 07/06/2020 1505   K 3.6 03/16/2021 1124   CL 104 03/16/2021 1124   CO2 25 03/16/2021 1124   GLUCOSE 102 (H) 03/16/2021 1124   BUN 12 03/16/2021 1124   BUN 18 07/06/2020 1505   CREATININE 1.09 03/16/2021 1124   CALCIUM 8.8 (L) 03/16/2021 1124   GFRNONAA >60 03/16/2021 1124   GFRAA 78 10/30/2019 1424   Estimated Creatinine Clearance: 45.4 mL/min (by C-G formula based on SCr of 1.09 mg/dL).  COAG Lab Results  Component Value Date   INR 1.1 01/26/2021   INR 1.1 01/26/2021   INR 0.97 11/08/2017    Radiology No results found.   Assessment/Plan 1. Chronic mesenteric ischemia (HCC) Recommend:  The patient is status post successful angiogram with intervention of the mesenteric vessels.  Celiac stent  was performed.  The patient reports that the abdominal  pain is improved and the post prandial symptoms are essentially gone.   The patient denies lifestyle limiting changes at this point in time.  No further invasive studies, angiography or surgery at this time The patient should continue walking and begin a more formal exercise program.  The patient should continue antiplatelet therapy and aggressive treatment of the  lipid abnormalities  Smoking cessation was again discussed  Patient should undergo noninvasive studies as ordered. The patient will follow up with me after the studies.   - VAS [place] MESENTERIC; Future  2. Carotid artery disease, unspecified laterality, unspecified type ([place]) Recommend:  Given the patient's asymptomatic subcritical stenosis no further invasive testing or surgery at this time.  Continue antiplatelet therapy as prescribed Continue management of CAD, HTN and Hyperlipidemia Healthy heart diet,  encouraged exercise at least 4 times per week Follow up in 12 months with duplex ultrasound and physical exam   - VAS US CAROTID; Future  3. PAD (peripheral artery disease) (HCC)  Recommend:  The patient has evidence of atherosclerosis of the lower extremities with claudication.  The patient does not voice lifestyle limiting changes at this point in time.  Noninvasive studies do not suggest clinically significant change.  No invasive studies, angiography or surgery at this time The patient should continue walking and begin a more formal exercise program.  The patient should continue antiplatelet therapy and aggressive treatment of the lipid abnormalities  No changes in the patient's medications at this time  - VAS [place] ABI WITH/WO TBI; Future  4. Coronary artery disease of native artery of native heart with stable angina pectoris (HCC) Continue cardiac and antihypertensive medications as already ordered and reviewed, no changes at this time.  Continue statin as ordered and reviewed, no changes at this time  Nitrates  PRN for chest pain   5. Primary hypertension Continue antihypertensive medications as already ordered, these medications have been reviewed and there are no changes at this time.   6. Hyperlipidemia, unspecified hyperlipidemia type Continue statin as ordered and reviewed, no changes at this time     [name], MD  03/23/2021 8:57 AM

## 2021-04-10 NOTE — Progress Notes (Signed)
Patient ID: Todd Whitehead, male    DOB: 1945-02-06, 76 y.o.   MRN: [id number]   Mr Geisel is a 76 y/o male with a history of HTN, anxiety, COPD, CAD (MI), PVD, current tobacco use and chronic heart failure.   Echo report from 01/27/21 Left ventricular ejection fraction, by estimation, is 45 to 50%. The left ventricle has mildly decreased function. The left ventricle demonstrates global hypokinesis. There is mild left ventricular hypertrophy of the basal-septal segment. Left ventricular diastolic parameters are consistent with Grade I diastolic dysfunction  LHC done 08/19/15 and showed: Mid LAD lesion, 80% stenosed. Mid Cx lesion, 85% stenosed. Mid RCA lesion, 100% stenosed. LM lesion, 50% stenosed. 3 vessel cad. Mildly reduced lv funciton  Admitted 01/26/21 due to acute on chronic CHF and CAP. New RLL mass found. Admitted 12/09/20 due to HF exacerbation. Initially given IV lasix with transition to oral diuretics. Needed supplemental oxygen. Chest CT showed severe emphysema signs of aspiration. Cardiology consult obtained. Discharged after 3 days on oxygen.    He presents today for follow up visit with a chief complaint of moderate shortness of breath with little exertion. Says that this has been present for several years, but is slightly improved since his last visit to the HF clinic.  He has associated fatigue, palpitations and anxiety along with this. He denies any dizziness, difficulty sleeping, abdominal distention, pedal edema, chest pain, wheezing, cough or weight gain.   He is feeling much better since his last visit to the HF clinic. He has weaned himself off of his oxygen on his own volition. Continues to smoke 1.5 ppd of cigarettes.  Past Medical History:  Diagnosis Date   Anxiety    Arthritis    CHF (congestive heart failure) (HCC)    Colon polyp    COPD (chronic obstructive pulmonary disease) ([place])    smoker   Dysrhythmia    Glaucoma    Heart murmur    Hypertension    on   no meds   Low serum vitamin B12 11/18/2020   Melanoma ([place])    Resected from Left upper arm.    Myocardial infarction [facility]) 2017   Peripheral vascular disease [facility])    Prostate enlargement    Past Surgical History:  Procedure Laterality Date   BACK SURGERY  01/19/98   CARDIAC CATHETERIZATION N/A 08/19/2015   Procedure: Left Heart Cath and Coronary Angiography;  Surgeon: [name], MD;  Location: [place] CV LAB;  Service: Cardiovascular;  Laterality: N/A;   CHOLECYSTECTOMY N/A 08/19/2016   Procedure: LAPAROSCOPIC CHOLECYSTECTOMY WITH INTRAOPERATIVE CHOLANGIOGRAM;  Surgeon: [name], MD;  Location: [place];  Service: General;  Laterality: N/A;   COLON SURGERY  age 2 days old   " bowel was twisted up"   COLONOSCOPY WITH PROPOFOL N/A 03/19/2016   Procedure: COLONOSCOPY WITH PROPOFOL;  Surgeon: [name], MD;  Location: [facility] ENDOSCOPY;  Service: Endoscopy;  Laterality: N/A;   CORONARY ARTERY BYPASS GRAFT  2017   ERCP N/A 08/18/2016   Procedure: ENDOSCOPIC RETROGRADE CHOLANGIOPANCREATOGRAPHY (ERCP);  Surgeon: [name], MD;  Location: [place];  Service: Gastroenterology;  Laterality: N/A;   ESOPHAGOGASTRODUODENOSCOPY (EGD) WITH PROPOFOL N/A 09/18/2018   Procedure: ESOPHAGOGASTRODUODENOSCOPY (EGD) WITH PROPOFOL;  Surgeon: [name], DO;  Location: [place];  Service: General;  Laterality: N/A;   EYE SURGERY Left 03/26/11   LOWER EXTREMITY ANGIOGRAPHY Left 10/03/2017   Procedure: LOWER EXTREMITY ANGIOGRAPHY;  Surgeon: [name], MD;  Location: [place] CV LAB;  Service: Cardiovascular;  Laterality: Left;   LOWER EXTREMITY ANGIOGRAPHY Right 10/31/2017   Procedure: LOWER EXTREMITY ANGIOGRAPHY;  Surgeon: [name], MD;  Location: [place] CV LAB;  Service: Cardiovascular;  Laterality: Right;   LUMBAR LAMINECTOMY/DECOMPRESSION MICRODISCECTOMY Right 10/02/2012   Procedure: Right Lumbar Five-Sacral One Lumbar laminotomy/Microdiskectomy;  Surgeon: [name], MD;  Location: [place] NEUR[facility] ORS;  Service: Neurosurgery;  Laterality: Right;  Right Lumbar Five-Sacral One Lumbar laminotomy/Microdiskectomy   VISCERAL ANGIOGRAPHY N/A 11/08/2020   Procedure: VISCERAL ANGIOGRAPHY;  Surgeon: Katha Cabal, MD;  Location: [place] [facility];  Service: Cardiovascular;  Laterality: N/A;   VISCERAL ANGIOGRAPHY N/A 11/25/2020   Procedure: VISCERAL ANGIOGRAPHY;  Surgeon: Katha Cabal, MD;  Location: [place] CV LAB;  Service: Cardiovascular;  Laterality: N/A;   Family History  Problem Relation Age of Onset   Heart attack Father    Aneurysm Brother    Lung cancer Brother    Social History   Tobacco Use   Smoking status: Every Day    Packs/day: 1.50    Years: 58.00    Pack years: 87.00    Types: Cigarettes   Smokeless tobacco: Never  Substance Use Topics   Alcohol use: No   Allergies  Allergen Reactions   Other Nausea And Vomiting    Cheese,butter,sour cream,cottage cheese   Poison Ivy Extract Rash   Poison Oak Extract Rash   Prior to Admission medications   Medication Sig Start Date End Date Taking? Authorizing Provider  acetaminophen (TYLENOL) 500 MG tablet Take 1 tablet (500 mg total) by mouth every 6 (six) hours as needed. Patient taking differently: Take 500 mg by mouth every 6 (six) hours as needed for moderate pain or headache. 08/20/16  Yes Waldron Session, MD  albuterol (PROVENTIL) (2.5 MG/3ML) 0.083% nebulizer solution Take 3 mLs (2.5 mg total) by nebulization every 6 (six) hours as needed for wheezing or shortness of breath. 01/30/21  Yes Fritzi Mandes, MD  ALPRAZolam Duanne Moron) 0.5 MG tablet Take 0.25 mg by mouth daily as needed for sleep or anxiety.   Yes [provider]  aspirin EC 81 MG tablet Take 81 mg by mouth daily. Swallow whole.   Yes [provider]  atorvastatin (LIPITOR) 40 MG tablet Take 40 mg by mouth daily.  05/22/17  Yes [provider]  brimonidine (ALPHAGAN) 0.2 % ophthalmic solution  Place 1 drop into both eyes 2 (two) times daily. 02/14/16  Yes [provider]  budesonide-formoterol (SYMBICORT) 80-4.5 MCG/ACT inhaler Inhale 2 puffs into the lungs 2 (two) times daily. 12/12/20  Yes Richarda Osmond, MD  busPIRone (BUSPAR) 30 MG tablet Take 30 mg by mouth 2 (two) times daily.   Yes [provider]  citalopram (CELEXA) 20 MG tablet Take 20 mg by mouth daily. 10/28/19  Yes [provider]  clopidogrel (PLAVIX) 75 MG tablet Take 1 tablet (75 mg total) by mouth daily. 11/26/20  Yes Schnier, Dolores Lory, MD  ferrous sulfate 325 (65 FE) MG EC tablet Take 1 tablet (325 mg total) by mouth 2 (two) times daily with a meal. 02/09/21  Yes Earlie Server, MD  latanoprost (XALATAN) 0.005 % ophthalmic solution Place 1 drop into both eyes at bedtime.  06/14/17  Yes [provider]  metoprolol succinate (TOPROL-XL) 25 MG 24 hr tablet Take 12.5 mg by mouth daily.   Yes [provider]  Multiple Vitamin (MULTIVITAMIN WITH MINERALS) TABS tablet Take 1 tablet by mouth daily. 01/31/21  Yes Fritzi Mandes,  MD  QUEtiapine (SEROQUEL) 25 MG tablet Take 1 tablet (25 mg total) by mouth at bedtime. 01/30/21  Yes Fritzi Mandes, MD  sacubitril-valsartan (ENTRESTO) 24-26 MG Take 1 tablet by mouth 2 (two) times daily. 03/16/21  Yes Darylene Price A, FNP  tamsulosin (FLOMAX) 0.4 MG CAPS capsule Take 1 capsule (0.4 mg total) by mouth daily after breakfast. 08/20/16  Yes Waldron Session, MD  traMADol (ULTRAM) 50 MG tablet Take 1 tablet (50 mg total) by mouth every 12 (twelve) hours as needed. 12/12/20  Yes Richarda Osmond, MD  traZODone (DESYREL) 50 MG tablet Take 50 mg by mouth at bedtime. 06/02/19  Yes [provider]    Review of Systems  Constitutional:  Positive for fatigue (improving). Negative for appetite change.  HENT:  Negative for congestion, postnasal drip and sore throat.   Eyes: Negative.   Respiratory:  Positive for shortness of breath (baseline). Negative for cough,  chest tightness and wheezing.   Cardiovascular:  Positive for palpitations (at times). Negative for chest pain and leg swelling.  Gastrointestinal:  Negative for abdominal distention and abdominal pain.  Endocrine: Negative.   Genitourinary: Negative.   Musculoskeletal:  Negative for back pain and neck pain.  Skin: Negative.   Allergic/Immunologic: Negative.   Neurological:  Negative for dizziness and light-headedness.  Hematological:  Negative for adenopathy. Does not bruise/bleed easily.  Psychiatric/Behavioral:  Negative for dysphoric mood and sleep disturbance (sleeping on 1 pillow). The patient is nervous/anxious.    Vitals:   04/11/21 1029  BP: (!) 135/55  Pulse: 75  Resp: 20  SpO2: (!) 88%  Weight: 122 lb 6 oz (55.5 kg)  Height: '5\' 8"'$  (1.727 m)   Wt Readings from Last 3 Encounters:  04/11/21 122 lb 6 oz (55.5 kg)  03/23/21 120 lb 12.8 oz (54.8 kg)  03/16/21 124 lb (56.2 kg)    Lab Results  Component Value Date   CREATININE 1.09 03/16/2021   CREATININE 0.85 01/28/2021   CREATININE 0.83 01/27/2021   Physical Exam Vitals and nursing note reviewed.  Constitutional:      General: He is not in acute distress.    Appearance: Normal appearance. He is ill-appearing. He is not toxic-appearing.  HENT:     Head: Normocephalic and atraumatic.  Cardiovascular:     Rate and Rhythm: Normal rate and regular rhythm.     Pulses: Normal pulses.     Heart sounds: No murmur heard. Pulmonary:     Effort: Pulmonary effort is normal. No tachypnea.     Breath sounds: No wheezing, rhonchi or rales.  Abdominal:     General: Abdomen is flat.     Palpations: Abdomen is soft.     Tenderness: There is no abdominal tenderness.  Musculoskeletal:        General: No tenderness.     Cervical back: Normal range of motion and neck supple.     Right lower leg: No tenderness. No edema.     Left lower leg: No tenderness. No edema.  Skin:    General: Skin is warm and dry.     Nails: There is  clubbing.  Neurological:     General: No focal deficit present.     Mental Status: He is alert and oriented to person, place, and time.  Psychiatric:        Mood and Affect: Mood is anxious.        Behavior: Behavior normal.        Thought Content: Thought content normal.  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)- - NYHA class III - euvolemic today - weighing daily; reminded to call for an overnight weight gain of > 2 pounds or a weekly weight gain of > 5 pounds - weight 122 today; down 2 pounds from last visit here 1 month agi - not adding salt but does like saltier foods; likes to eat bacon every morning for breakfast, reviewed making lower sodium choices - saw cardiology Corky Sox) on 03/21/21 - on GDMT of metoprolol & entresto - entresto was to be titrated up at last visit, pt misunderstood instructions and has not started taking increased dose - has 1 week left of entresto 24/26 will finish and then increase 49/51 - BNP 12/09/20 was 1527.4 - has been taking lasix PRN and tolerating well, no edema noted today  - consider MRA and SGLT2 in the future, however cost may be prohibitive for SGLT2  2: HTN- - BP 135/55, visibly anxious  - saw PCP Ouida Sills) 02/06/21; will call to schedule a f/u appt today to discuss O2 use - BMP 03/16/21 reviewed and showed sodium 135, potassium 3.6, creatinine 1.09& GFR >60  3: severe COPD- - has weaned himself off of oxygen, advised him to f/u with PCP or pulmonology to discuss this - SPO2 88-94% in office, offered supplemental O2 but he declined - saw pulmonology Raul Del) 02/14/21, returns on 05/03/21 for pulmonary nodules discovered at last admission  4: Tobacco use- - smoking 1.5 ppd of cigarettes, does not want to discuss cessation today - has been smoking since the age of 27  5: Anemia - will see hematology Tasia Catchings) 05/10/21, will see if they can add BMP to labs after up titration  of entresto   Medication list reviewed.    Return in 6 weeks or sooner for any questions/problems before then.

## 2021-04-11 ENCOUNTER — Other Ambulatory Visit: Payer: Self-pay

## 2021-04-11 ENCOUNTER — Encounter: Payer: Self-pay | Admitting: Family

## 2021-04-11 ENCOUNTER — Ambulatory Visit: Payer: Medicare Other | Attending: Family | Admitting: Family

## 2021-04-11 VITALS — BP 135/55 | HR 75 | Resp 20 | Ht 68.0 in | Wt 122.4 lb

## 2021-04-11 DIAGNOSIS — I2510 Atherosclerotic heart disease of native coronary artery without angina pectoris: Secondary | ICD-10-CM | POA: Insufficient documentation

## 2021-04-11 DIAGNOSIS — F17210 Nicotine dependence, cigarettes, uncomplicated: Secondary | ICD-10-CM | POA: Insufficient documentation

## 2021-04-11 DIAGNOSIS — I10 Essential (primary) hypertension: Secondary | ICD-10-CM | POA: Diagnosis not present

## 2021-04-11 DIAGNOSIS — D649 Anemia, unspecified: Secondary | ICD-10-CM | POA: Diagnosis not present

## 2021-04-11 DIAGNOSIS — Z79899 Other long term (current) drug therapy: Secondary | ICD-10-CM | POA: Insufficient documentation

## 2021-04-11 DIAGNOSIS — T415X6A Underdosing of therapeutic gases, initial encounter: Secondary | ICD-10-CM | POA: Insufficient documentation

## 2021-04-11 DIAGNOSIS — F419 Anxiety disorder, unspecified: Secondary | ICD-10-CM | POA: Diagnosis not present

## 2021-04-11 DIAGNOSIS — I110 Hypertensive heart disease with heart failure: Secondary | ICD-10-CM | POA: Insufficient documentation

## 2021-04-11 DIAGNOSIS — Z91128 Patient's intentional underdosing of medication regimen for other reason: Secondary | ICD-10-CM | POA: Insufficient documentation

## 2021-04-11 DIAGNOSIS — J449 Chronic obstructive pulmonary disease, unspecified: Secondary | ICD-10-CM | POA: Diagnosis not present

## 2021-04-11 DIAGNOSIS — Z720 Tobacco use: Secondary | ICD-10-CM | POA: Diagnosis not present

## 2021-04-11 DIAGNOSIS — I252 Old myocardial infarction: Secondary | ICD-10-CM | POA: Diagnosis not present

## 2021-04-11 DIAGNOSIS — I739 Peripheral vascular disease, unspecified: Secondary | ICD-10-CM | POA: Insufficient documentation

## 2021-04-11 DIAGNOSIS — I5022 Chronic systolic (congestive) heart failure: Secondary | ICD-10-CM | POA: Diagnosis present

## 2021-04-11 NOTE — Patient Instructions (Signed)
Continue to weigh daily. ? ?Call us if you need anything. ? ?Try to cut back your tobacco use. ? ?Call Dr. Sharyon Cable office to see about if you need the oxygen or not. ?

## 2021-04-18 ENCOUNTER — Other Ambulatory Visit: Payer: Self-pay | Admitting: Oncology

## 2021-04-18 DIAGNOSIS — D472 Monoclonal gammopathy: Secondary | ICD-10-CM

## 2021-04-26 ENCOUNTER — Ambulatory Visit
Admission: RE | Admit: 2021-04-26 | Discharge: 2021-04-26 | Disposition: A | Discharge status: Home / Self Care | Payer: Medicare Other | Source: Ambulatory Visit | Attending: Specialist | Admitting: Specialist

## 2021-04-26 ENCOUNTER — Other Ambulatory Visit: Payer: Self-pay

## 2021-04-26 DIAGNOSIS — R911 Solitary pulmonary nodule: Secondary | ICD-10-CM | POA: Insufficient documentation

## 2021-04-26 DIAGNOSIS — R918 Other nonspecific abnormal finding of lung field: Secondary | ICD-10-CM | POA: Diagnosis present

## 2021-05-10 ENCOUNTER — Inpatient Hospital Stay: Payer: Medicare Other | Attending: Oncology

## 2021-05-10 DIAGNOSIS — D472 Monoclonal gammopathy: Secondary | ICD-10-CM | POA: Diagnosis not present

## 2021-05-10 DIAGNOSIS — D649 Anemia, unspecified: Secondary | ICD-10-CM

## 2021-05-10 DIAGNOSIS — D631 Anemia in chronic kidney disease: Secondary | ICD-10-CM | POA: Diagnosis not present

## 2021-05-10 DIAGNOSIS — E538 Deficiency of other specified B group vitamins: Secondary | ICD-10-CM | POA: Diagnosis present

## 2021-05-10 DIAGNOSIS — N189 Chronic kidney disease, unspecified: Secondary | ICD-10-CM | POA: Insufficient documentation

## 2021-05-10 LAB — COMPREHENSIVE METABOLIC PANEL
ALT: 20 U/L (ref 0–44)
AST: 32 U/L (ref 15–41)
Albumin: 3.8 g/dL (ref 3.5–5.0)
Alkaline Phosphatase: 79 U/L (ref 38–126)
Anion gap: 7 (ref 5–15)
BUN: 18 mg/dL (ref 8–23)
CO2: 25 mmol/L (ref 22–32)
Calcium: 9.1 mg/dL (ref 8.9–10.3)
Chloride: 103 mmol/L (ref 98–111)
Creatinine, Ser: 1.22 mg/dL (ref 0.61–1.24)
GFR, Estimated: >60 mL/min (ref >60)
Glucose, Bld: 113 mg/dL — ABNORMAL HIGH (ref 70–99)
Potassium: 3.9 mmol/L (ref 3.5–5.1)
Sodium: 135 mmol/L (ref 135–145)
Total Bilirubin: 0.5 mg/dL (ref 0.3–1.2)
Total Protein: 6.6 g/dL (ref 6.5–8.1)

## 2021-05-10 LAB — CBC WITH DIFFERENTIAL/PLATELET
Abs Immature Granulocytes: 0.04 10*3/uL (ref 0.00–0.07)
Basophils Absolute: 0.1 10*3/uL (ref 0.0–0.1)
Basophils Relative: 1 %
Eosinophils Absolute: 0.1 10*3/uL (ref 0.0–0.5)
Eosinophils Relative: 2 %
HCT: 31.1 % — ABNORMAL LOW (ref 39.0–52.0)
Hemoglobin: 10.6 g/dL — ABNORMAL LOW (ref 13.0–17.0)
Immature Granulocytes: 1 %
Lymphocytes Relative: 25 %
Lymphs Abs: 1.6 10*3/uL (ref 0.7–4.0)
MCH: 31.6 pg (ref 26.0–34.0)
MCHC: 34.1 g/dL (ref 30.0–36.0)
MCV: 92.8 fL (ref 80.0–100.0)
Monocytes Absolute: 0.5 10*3/uL (ref 0.1–1.0)
Monocytes Relative: 9 %
Neutro Abs: 3.9 10*3/uL (ref 1.7–7.7)
Neutrophils Relative %: 62 %
Platelets: 178 10*3/uL (ref 150–400)
RBC: 3.35 MIL/uL — ABNORMAL LOW (ref 4.22–5.81)
RDW: 15.9 % — ABNORMAL HIGH (ref 11.5–15.5)
WBC: 6.3 10*3/uL (ref 4.0–10.5)
nRBC: 0 % (ref 0.0–0.2)

## 2021-05-10 LAB — FERRITIN: Ferritin: 50 ng/mL (ref 24–336)

## 2021-05-10 LAB — IRON AND TIBC
Iron: 104 ug/dL (ref 45–182)
Saturation Ratios: 31 % (ref 17.9–39.5)
TIBC: 337 ug/dL (ref 250–450)
UIBC: 233 ug/dL

## 2021-05-11 LAB — KAPPA/LAMBDA LIGHT CHAINS
Kappa free light chain: 44.4 mg/L — ABNORMAL HIGH (ref 3.3–19.4)
Kappa, lambda light chain ratio: 2.1 — ABNORMAL HIGH (ref 0.26–1.65)
Lambda free light chains: 21.1 mg/L (ref 5.7–26.3)

## 2021-05-12 LAB — MULTIPLE MYELOMA PANEL, SERUM
Albumin SerPl Elph-Mcnc: 3.6 g/dL (ref 2.9–4.4)
Albumin/Glob SerPl: 1.4 (ref 0.7–1.7)
Alpha 1: 0.3 g/dL (ref 0.0–0.4)
Alpha2 Glob SerPl Elph-Mcnc: 0.7 g/dL (ref 0.4–1.0)
B-Globulin SerPl Elph-Mcnc: 0.8 g/dL (ref 0.7–1.3)
Gamma Glob SerPl Elph-Mcnc: 1 g/dL (ref 0.4–1.8)
Globulin, Total: 2.7 g/dL (ref 2.2–3.9)
IgA: 139 mg/dL (ref 61–437)
IgG (Immunoglobin G), Serum: 996 mg/dL (ref 603–1613)
IgM (Immunoglobulin M), Srm: 80 mg/dL (ref 15–143)
Total Protein ELP: 6.3 g/dL (ref 6.0–8.5)

## 2021-05-17 ENCOUNTER — Encounter: Payer: Self-pay | Admitting: Oncology

## 2021-05-17 ENCOUNTER — Inpatient Hospital Stay: Payer: Medicare Other

## 2021-05-17 ENCOUNTER — Inpatient Hospital Stay (HOSPITAL_BASED_OUTPATIENT_CLINIC_OR_DEPARTMENT_OTHER): Payer: Medicare Other | Admitting: Oncology

## 2021-05-17 VITALS — BP 175/67 | HR 72 | Temp 97.3°F | Wt 125.0 lb

## 2021-05-17 DIAGNOSIS — D472 Monoclonal gammopathy: Secondary | ICD-10-CM | POA: Diagnosis not present

## 2021-05-17 DIAGNOSIS — D649 Anemia, unspecified: Secondary | ICD-10-CM

## 2021-05-17 DIAGNOSIS — Z85820 Personal history of malignant melanoma of skin: Secondary | ICD-10-CM | POA: Diagnosis not present

## 2021-05-17 DIAGNOSIS — E538 Deficiency of other specified B group vitamins: Secondary | ICD-10-CM | POA: Diagnosis not present

## 2021-05-17 MED ORDER — CYANOCOBALAMIN 1000 MCG/ML IJ SOLN
1000.0000 ug | Freq: Once | INTRAMUSCULAR | Status: AC
  Administered 2021-05-17: 1000 ug via INTRAMUSCULAR

## 2021-05-17 NOTE — Progress Notes (Signed)
?Hematology/Oncology Progress note ?[phone] [phone] ?  ? ? ? ?Patient Care Team: ?[name], MD as PCP - General (Internal Medicine) ?Earlie Server, MD as Consulting Physician (Hematology and Oncology) ? ?REFERRING PROVIDER: ?[name], MD  ?CHIEF COMPLAINTS/REASON FOR VISIT:  ?Anemia and vitamin B12 deficiency ? ?HISTORY OF PRESENTING ILLNESS:  ? ?Todd Whitehead is a  76 y.o.  male with PMH listed below was seen in consultation at the request of  [name], MD  for evaluation of anemia. ?Patient was accompanied by her friend Ms. Williams. ?8/31 2022, CBC showed a hemoglobin of 10.3, hematocrit 29.5, WBC 5.7  ?10/20/2020, iron panel showed decreased iron saturation of 12, ferritin 140, TIBC 265. ? ?Patient reports feeling fatigued and tired.  Denies any hematemesis or hematochezia. ?Patient has chronic abdominal pain, and only is able to eat 1 meal per day.  Patient has significant weight loss as well as some nausea.  ?07/06/2020 CT abdomen pelvis showed no acute findings.  No explanation of patient's unintended weight loss.  Mucous plugging seen within the right lower lobe bronchi with minimal right base atelectasis or early infiltrate. ?Patient follows up with gastroenterology Dr.Tahiliani, has also been seen by surgery to [facility].  Surgical intervention was not recommended.   ?09/13/2020, CT abdomen pelvis with contrast at [facility] showed extensive atherosclerotic disease.  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  Previous cholecystectomy.  Prominence, bile duct.  Cyst of both kidneys.  Negative for hydronephrosis.  Moderate distention urinary bladder.  Questionable bladder outlet obstruction.  Prostate gland is mildly enlarged and heterogeneous. ?Patient was last seen by Dr. Bonna Gains on 10/20/2020.  GI recommends EGD and colonoscopy for further evaluation of his symptoms. ?Patient was seen by vascular surgery Dr.Schnier on 10/24/2020.  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 ?11/08/2020, aortogram showed a diffusely diseased no hemodynamically significant lesions identified.  SMA was widely patent.  Celiac demonstrates greater than 90% stenosis at its origin but distal to the stenosis appears to be widely patent.  Patient will be rescheduled with plan of upper extremity access which will allow leverage enabling treatment of the lesion and revascularization and stent placement. ? ?Patient was referred to establish care with hematology for evaluation of anemia. ? ?01/26/2021 - 01/30/2021, patient was hospitalized due to hemoptysis, acute on chronic systolic CHF, community-acquired pneumonia, acute hypoxic respiratory failure.  2D echo showed EF 26-33%, grade 1 diastolic dysfunction. ? ?INTERVAL HISTORY ?Todd Whitehead is a 76 y.o. male who has above history reviewed by me today presents for follow up of MGUS, anemia ?He reports feeling better now.  Appetite is fair.  He has gained weight ? ?Patient follows up with pulmonology for a suspicious nodule in his lung.  04/27/2021, CT chest without contrast showed interval resolution of the previously seen spiculated nodule of the dependent right lung base.  As well as bilateral airspace disease.  Consistent with resolution of infection or inflammation.  Severe emphysema, diffuse bronchial wall thickening.  CAD. ?. ?Patient is off home oxygen currently.  Shortness of breath is at baseline. ? ?Review of Systems  ?Constitutional:  Positive for fatigue. Negative for appetite change, chills, diaphoresis, fever and unexpected weight change.  ?HENT:   Negative for hearing loss, lump/mass, nosebleeds and sore throat.   ?Eyes:  Negative for eye problems and icterus.  ?Respiratory:  Positive for shortness of breath. Negative for chest tightness, cough, hemoptysis and wheezing.   ?Cardiovascular:  Negative for chest pain  and leg swelling.  ?Gastrointestinal:  Negative for abdominal distention, abdominal pain, blood in stool,  diarrhea, nausea and rectal pain.  ?Endocrine: Negative for hot flashes.  ?Genitourinary:  Negative for bladder incontinence, difficulty urinating, dysuria, frequency, hematuria and nocturia.   ?Musculoskeletal:  Negative for back pain, flank pain, gait problem and myalgias.  ?Skin:  Negative for rash.  ?Neurological:  Negative for dizziness, gait problem, headaches, numbness and seizures.  ?Hematological:  Negative for adenopathy. Does not bruise/bleed easily.  ?Psychiatric/Behavioral:  Negative for confusion and decreased concentration. The patient is not nervous/anxious.   ? ?MEDICAL HISTORY:  ?Past Medical History:  ?Diagnosis Date  ? Anxiety   ? Arthritis   ? CHF (congestive heart failure) ([place])   ? Colon polyp   ? COPD (chronic obstructive pulmonary disease) ([place])   ? smoker  ? Dysrhythmia   ? Glaucoma   ? Heart murmur   ? Hypertension   ? on  no meds  ? Low serum vitamin B12 11/18/2020  ? Melanoma ([place])   ? Resected from Left upper arm.   ? Myocardial infarction [facility]) 2017  ? Peripheral vascular disease ([place])   ? Prostate enlargement   ? ? ?SURGICAL HISTORY: ?Past Surgical History:  ?Procedure Laterality Date  ? BACK SURGERY  01/19/98  ? CARDIAC CATHETERIZATION N/A 08/19/2015  ? Procedure: Left Heart Cath and Coronary Angiography;  Surgeon: [name], MD;  Location: [place] CV LAB;  Service: Cardiovascular;  Laterality: N/A;  ? CHOLECYSTECTOMY N/A 08/19/2016  ? Procedure: LAPAROSCOPIC CHOLECYSTECTOMY WITH INTRAOPERATIVE CHOLANGIOGRAM;  Surgeon: [name], MD;  Location: [place];  Service: General;  Laterality: N/A;  ? COLON SURGERY  age 18 days old  ? " bowel was twisted up"  ? COLONOSCOPY WITH PROPOFOL N/A 03/19/2016  ? Procedure: COLONOSCOPY WITH PROPOFOL;  Surgeon: [name], MD;  Location: [facility] ENDOSCOPY;  Service: Endoscopy;  Laterality: N/A;  ? CORONARY ARTERY BYPASS GRAFT  2017  ? ERCP N/A 08/18/2016  ? Procedure: ENDOSCOPIC RETROGRADE CHOLANGIOPANCREATOGRAPHY (ERCP);  Surgeon: [name], MD;  Location: [place];  Service: Gastroenterology;  Laterality: N/A;  ? ESOPHAGOGASTRODUODENOSCOPY (EGD) WITH PROPOFOL N/A 09/18/2018  ? Procedure: ESOPHAGOGASTRODUODENOSCOPY (EGD) WITH PROPOFOL;  Surgeon: [name], DO;  Location: [facility] ENDOSCOPY;  Service: General;  Laterality: N/A;  ? EYE SURGERY Left 03/26/11  ? LOWER EXTREMITY ANGIOGRAPHY Left 10/03/2017  ? Procedure: LOWER EXTREMITY ANGIOGRAPHY;  Surgeon: [name], MD;  Location: [place] CV LAB;  Service: Cardiovascular;  Laterality: Left;  ? LOWER EXTREMITY ANGIOGRAPHY Right 10/31/2017  ? Procedure: LOWER EXTREMITY ANGIOGRAPHY;  Surgeon: [name], MD;  Location: [place] CV LAB;  Service: Cardiovascular;  Laterality: Right;  ? LUMBAR LAMINECTOMY/DECOMPRESSION MICRODISCECTOMY Right 10/02/2012  ? Procedure: Right Lumbar Five-Sacral One Lumbar laminotomy/Microdiskectomy;  Surgeon: [name], MD;  Location: [place] NEURO ORS;  Service: Neurosurgery;  Laterality: Right;  Right Lumbar Five-Sacral One Lumbar laminotomy/Microdiskectomy  ? VISCERAL ANGIOGRAPHY N/A 11/08/2020  ? Procedure: VISCERAL ANGIOGRAPHY;  Surgeon: Katha Cabal, MD;  Location: [place] CV LAB;  Service: Cardiovascular;  Laterality: N/A;  ? VISCERAL ANGIOGRAPHY N/A 11/25/2020  ? Procedure: VISCERAL ANGIOGRAPHY;  Surgeon: Katha Cabal, MD;  Location: [place] CV LAB;  Service: Cardiovascular;  Laterality: N/A;  ? ? ?SOCIAL HISTORY: ?Social History  ? ?Socioeconomic History  ? Marital status: Divorced  ?  Spouse name: Not on file  ? Number of children: Not on file  ? Years of education: Not on file  ?  Highest education level: Not on file  ?Occupational History  ? Not on file  ?Tobacco Use  ? Smoking status: Every Day  ?  Packs/day: 1.50  ?  Years: 58.00  ?  Pack years: 87.00  ?  Types: Cigarettes  ? Smokeless tobacco: Never  ?Vaping Use  ? Vaping Use: Former  ?Substance and Sexual Activity  ? Alcohol use: No  ? Drug use: No  ? Sexual activity: Not  Currently  ?Other Topics Concern  ? Not on file  ?Social History Narrative  ? Not on file  ? ?Social Determinants of Health  ? ?Financial Resource Strain: Not on file  ?Food Insecurity: Not on file  ?Transpor

## 2021-05-22 NOTE — Progress Notes (Signed)
? Patient ID: Todd Whitehead, male    DOB: Jan 29, 1946, 76 y.o.   MRN: [id number] ? ? ?Mr Valletta is a 76 y/o male with a history of HTN, anxiety, COPD, CAD (MI), PVD, current tobacco use and chronic heart failure.  ? ?Echo report from 01/27/21 Left ventricular ejection fraction, by estimation, is 45 to 50%. The left ventricle has mildly decreased function. The left ventricle demonstrates global hypokinesis. There is mild left ventricular hypertrophy of the basal-septal segment. Left ventricular diastolic parameters are consistent with Grade I diastolic dysfunction ? ?LHC done 08/19/15 and showed: ?Mid LAD lesion, 80% stenosed. ?Mid Cx lesion, 85% stenosed. ?Mid RCA lesion, 100% stenosed. ?LM lesion, 50% stenosed. ?3 vessel cad. Mildly reduced lv funciton ? ?Admitted 01/26/21 due to acute on chronic CHF and CAP. New RLL mass found. Admitted 12/09/20 due to HF exacerbation. Initially given IV lasix with transition to oral diuretics. Needed supplemental oxygen. Chest CT showed severe emphysema signs of aspiration. Cardiology consult obtained. Discharged after 3 days on oxygen.   ? ?He presents today for follow up visit with a chief complaint of moderate shortness of breath with minimal exertion. He describes this as chronic in nature having been present for several years. He has associated fatigue, cough and anxiety along with this. He denies any difficulty sleeping, dizziness, abdominal distention, palpitations, pedal edema, chest pain, wheezing or weight gain.  ? ?Increased entresto and he denies having any issues with the increased dose. Does mention that he has to pay almost $50/ month for his entresto and it's difficult for him to pay this.  ? ?Has decreased his tobacco use to 1ppd; max amount was 3 ppd in years past ? ?Past Medical History:  ?Diagnosis Date  ? Anxiety   ? Arthritis   ? CHF (congestive heart failure) ([place])   ? Colon polyp   ? COPD (chronic obstructive pulmonary disease) ([place])   ? smoker  ? Dysrhythmia    ? Glaucoma   ? Heart murmur   ? Hypertension   ? on  no meds  ? Low serum vitamin B12 11/18/2020  ? Melanoma ([place])   ? Resected from Left upper arm.   ? Myocardial infarction [facility]) 2017  ? Peripheral vascular disease ([place])   ? Prostate enlargement   ? ?Past Surgical History:  ?Procedure Laterality Date  ? BACK SURGERY  01/19/98  ? CARDIAC CATHETERIZATION N/A 08/19/2015  ? Procedure: Left Heart Cath and Coronary Angiography;  Surgeon: [name], MD;  Location: [place] [facility];  Service: Cardiovascular;  Laterality: N/A;  ? CHOLECYSTECTOMY N/A 08/19/2016  ? Procedure: LAPAROSCOPIC CHOLECYSTECTOMY WITH INTRAOPERATIVE CHOLANGIOGRAM;  Surgeon: [name], MD;  Location: [place];  Service: General;  Laterality: N/A;  ? COLON SURGERY  age 36 days old  ? " bowel was twisted up"  ? COLONOSCOPY WITH PROPOFOL N/A 03/19/2016  ? Procedure: COLONOSCOPY WITH PROPOFOL;  Surgeon: [name], MD;  Location: [facility] ENDOSCOPY;  Service: Endoscopy;  Laterality: N/A;  ? CORONARY ARTERY BYPASS GRAFT  2017  ? ERCP N/A 08/18/2016  ? Procedure: ENDOSCOPIC RETROGRADE CHOLANGIOPANCREATOGRAPHY (ERCP);  Surgeon: [name], MD;  Location: [place];  Service: Gastroenterology;  Laterality: N/A;  ? ESOPHAGOGASTRODUODENOSCOPY (EGD) WITH PROPOFOL N/A 09/18/2018  ? Procedure: ESOPHAGOGASTRODUODENOSCOPY (EGD) WITH PROPOFOL;  Surgeon: [name], DO;  Location: [facility] ENDOSCOPY;  Service: General;  Laterality: N/A;  ? EYE SURGERY Left 03/26/11  ? LOWER EXTREMITY ANGIOGRAPHY Left 10/03/2017  ? Procedure: LOWER EXTREMITY ANGIOGRAPHY;  Surgeon: Lucky Cowboy,  Erskine Squibb, MD;  Location: [place] [facility];  Service: Cardiovascular;  Laterality: Left;  ? LOWER EXTREMITY ANGIOGRAPHY Right 10/31/2017  ? Procedure: LOWER EXTREMITY ANGIOGRAPHY;  Surgeon: [name], MD;  Location: [place] [facility];  Service: Cardiovascular;  Laterality: Right;  ? LUMBAR LAMINECTOMY/DECOMPRESSION MICRODISCECTOMY Right 10/02/2012  ? Procedure: Right Lumbar Five-Sacral One  Lumbar laminotomy/Microdiskectomy;  Surgeon: [name], MD;  Location: [place] NEURO ORS;  Service: Neurosurgery;  Laterality: Right;  Right Lumbar Five-Sacral One Lumbar laminotomy/Microdiskectomy  ? VISCERAL ANGIOGRAPHY N/A 11/08/2020  ? Procedure: VISCERAL ANGIOGRAPHY;  Surgeon: Katha Cabal, MD;  Location: [place] [facility];  Service: Cardiovascular;  Laterality: N/A;  ? VISCERAL ANGIOGRAPHY N/A 11/25/2020  ? Procedure: VISCERAL ANGIOGRAPHY;  Surgeon: Katha Cabal, MD;  Location: [place] [facility];  Service: Cardiovascular;  Laterality: N/A;  ? ?Family History  ?Problem Relation Age of Onset  ? Heart attack Father   ? Aneurysm Brother   ? Lung cancer Brother   ? ?Social History  ? ?Tobacco Use  ? Smoking status: Every Day  ?  Packs/day: 1.50  ?  Years: 58.00  ?  Pack years: 87.00  ?  Types: Cigarettes  ? Smokeless tobacco: Never  ?Substance Use Topics  ? Alcohol use: No  ? ?Allergies  ?Allergen Reactions  ? Other Nausea And Vomiting  ?  Cheese,butter,sour cream,cottage cheese  ? Poison Ivy Extract Rash  ? Poison Oak Extract Rash  ? ?Prior to Admission medications   ?Medication Sig Start Date End Date Taking? Authorizing Provider  ?acetaminophen (TYLENOL) 500 MG tablet Take 1 tablet (500 mg total) by mouth every 6 (six) hours as needed. ?Patient taking differently: Take 500 mg by mouth every 6 (six) hours as needed for moderate pain or headache. 08/20/16  Yes Waldron Session, MD  ?albuterol (PROVENTIL) (2.5 MG/3ML) 0.083% nebulizer solution Take 3 mLs (2.5 mg total) by nebulization every 6 (six) hours as needed for wheezing or shortness of breath. 01/30/21  Yes Fritzi Mandes, MD  ?ALPRAZolam Duanne Moron) 0.5 MG tablet Take 0.25 mg by mouth daily as needed for sleep or anxiety.   Yes [provider]  ?aspirin EC 81 MG tablet Take 81 mg by mouth daily. Swallow whole.   Yes [provider]  ?atorvastatin (LIPITOR) 40 MG tablet Take 40 mg by mouth daily.  05/22/17  Yes [provider]   ?brimonidine (ALPHAGAN) 0.2 % ophthalmic solution Place 1 drop into both eyes 2 (two) times daily. 02/14/16  Yes [provider]  ?budesonide-formoterol (SYMBICORT) 80-4.5 MCG/ACT inhaler Inhale 2 puffs into the lungs 2 (two) times daily. 12/12/20  Yes Richarda Osmond, MD  ?busPIRone (BUSPAR) 30 MG tablet Take 30 mg by mouth 2 (two) times daily.   Yes [provider]  ?citalopram (CELEXA) 20 MG tablet Take 20 mg by mouth daily. 10/28/19  Yes [provider]  ?clopidogrel (PLAVIX) 75 MG tablet Take 1 tablet (75 mg total) by mouth daily. 11/26/20  Yes Schnier, Dolores Lory, MD  ?ferrous sulfate 325 (65 FE) MG EC tablet Take 1 tablet (325 mg total) by mouth 2 (two) times daily with a meal. 02/09/21  Yes Earlie Server, MD  ?latanoprost (XALATAN) 0.005 % ophthalmic solution Place 1 drop into both eyes at bedtime.  06/14/17  Yes [provider]  ?metoprolol succinate (TOPROL-XL) 25 MG 24 hr tablet Take 12.5 mg by mouth daily.   Yes [provider]  ?Multiple Vitamin (MULTIVITAMIN WITH MINERALS) TABS tablet Take  1 tablet by mouth daily. 01/31/21  Yes Fritzi Mandes, MD  ?QUEtiapine (SEROQUEL) 25 MG tablet Take 1 tablet (25 mg total) by mouth at bedtime. ?Patient taking differently: Take 25 mg by mouth 2 (two) times daily. 01/30/21  Yes Fritzi Mandes, MD  ?sacubitril-valsartan (ENTRESTO) 49-51 MG Take 1 tablet by mouth 2 (two) times daily. 03/16/21  Yes Alisa Graff, FNP  ?tamsulosin (FLOMAX) 0.4 MG CAPS capsule Take 1 capsule (0.4 mg total) by mouth daily after breakfast. 08/20/16  Yes Waldron Session, MD  ?traMADol (ULTRAM) 50 MG tablet Take 1 tablet (50 mg total) by mouth every 12 (twelve) hours as needed. 12/12/20  Yes Richarda Osmond, MD  ?traZODone (DESYREL) 50 MG tablet Take 50 mg by mouth at bedtime. 06/02/19  Yes [provider]  ? ? ?Review of Systems  ?Constitutional:  Positive for fatigue (improving). Negative for appetite change.  ?HENT:  Negative for congestion, postnasal  drip and sore throat.   ?Eyes: Negative.   ?Respiratory:  Positive for cough and shortness of breath (with little exertion). Negative for chest tightness and wheezing.   ?Cardiovascular:  Negative for

## 2021-05-23 ENCOUNTER — Ambulatory Visit (HOSPITAL_BASED_OUTPATIENT_CLINIC_OR_DEPARTMENT_OTHER): Payer: Medicare Other | Admitting: Family

## 2021-05-23 ENCOUNTER — Encounter: Payer: Self-pay | Admitting: Family

## 2021-05-23 ENCOUNTER — Other Ambulatory Visit
Admission: RE | Admit: 2021-05-23 | Discharge: 2021-05-23 | Disposition: A | Discharge status: Home / Self Care | Payer: Medicare Other | Source: Ambulatory Visit | Attending: Family | Admitting: Family

## 2021-05-23 VITALS — BP 116/50 | HR 70 | Resp 18 | Ht 68.0 in | Wt 123.4 lb

## 2021-05-23 DIAGNOSIS — J449 Chronic obstructive pulmonary disease, unspecified: Secondary | ICD-10-CM | POA: Insufficient documentation

## 2021-05-23 DIAGNOSIS — I5022 Chronic systolic (congestive) heart failure: Secondary | ICD-10-CM | POA: Insufficient documentation

## 2021-05-23 DIAGNOSIS — F17210 Nicotine dependence, cigarettes, uncomplicated: Secondary | ICD-10-CM | POA: Insufficient documentation

## 2021-05-23 DIAGNOSIS — Z720 Tobacco use: Secondary | ICD-10-CM | POA: Diagnosis not present

## 2021-05-23 DIAGNOSIS — I252 Old myocardial infarction: Secondary | ICD-10-CM | POA: Insufficient documentation

## 2021-05-23 DIAGNOSIS — I110 Hypertensive heart disease with heart failure: Secondary | ICD-10-CM | POA: Insufficient documentation

## 2021-05-23 DIAGNOSIS — I10 Essential (primary) hypertension: Secondary | ICD-10-CM | POA: Diagnosis not present

## 2021-05-23 DIAGNOSIS — Z7984 Long term (current) use of oral hypoglycemic drugs: Secondary | ICD-10-CM | POA: Insufficient documentation

## 2021-05-23 DIAGNOSIS — I739 Peripheral vascular disease, unspecified: Secondary | ICD-10-CM | POA: Insufficient documentation

## 2021-05-23 DIAGNOSIS — Z79899 Other long term (current) drug therapy: Secondary | ICD-10-CM | POA: Insufficient documentation

## 2021-05-23 DIAGNOSIS — D649 Anemia, unspecified: Secondary | ICD-10-CM | POA: Insufficient documentation

## 2021-05-23 LAB — BASIC METABOLIC PANEL
Anion gap: 7 (ref 5–15)
BUN: 25 mg/dL — ABNORMAL HIGH (ref 8–23)
CO2: 25 mmol/L (ref 22–32)
Calcium: 9.2 mg/dL (ref 8.9–10.3)
Chloride: 106 mmol/L (ref 98–111)
Creatinine, Ser: 1.38 mg/dL — ABNORMAL HIGH (ref 0.61–1.24)
GFR, Estimated: 53 mL/min — ABNORMAL LOW (ref >60)
Glucose, Bld: 86 mg/dL (ref 70–99)
Potassium: 4 mmol/L (ref 3.5–5.1)
Sodium: 138 mmol/L (ref 135–145)

## 2021-05-23 MED ORDER — SACUBITRIL-VALSARTAN 49-51 MG PO TABS: 1.0000 | ORAL_TABLET | Freq: Two times a day (BID) | ORAL | 3 refills | Status: DC

## 2021-05-23 MED ORDER — DAPAGLIFLOZIN PROPANEDIOL 10 MG PO TABS: 10.0000 mg | ORAL_TABLET | Freq: Every day | ORAL | 5 refills | Status: DC

## 2021-05-23 MED ORDER — DAPAGLIFLOZIN PROPANEDIOL 10 MG PO TABS: 10.0000 mg | ORAL_TABLET | Freq: Every day | ORAL | 3 refills | Status: DC

## 2021-05-23 NOTE — Patient Instructions (Addendum)
Continue weighing daily and call for an overnight weight gain of 3 pounds or more or a weekly weight gain of more than 5 pounds.   If you have voicemail, please make sure your mailbox is cleaned out so that we may leave a message and please make sure to listen to any voicemails.    Begin taking farxiga as 1 tablet every morning 

## 2021-05-24 ENCOUNTER — Telehealth: Payer: Self-pay | Admitting: Family

## 2021-05-24 NOTE — Telephone Encounter (Signed)
Called and notified patient that he was approved for [employer] patient assistance for Entresto until the end of the year and instruction son how to call and set up delivery. ? ? ?Lawerance Matsuo, NT ?

## 2021-05-26 ENCOUNTER — Telehealth: Payer: Self-pay | Admitting: Family

## 2021-05-26 NOTE — Telephone Encounter (Signed)
Notified patient he was also approved for [place] patient assistance til the end of the year and it automatically shipped and he should have it in 7 to 10 business days. ? ? ?Mistee Soliman, NT ?

## 2021-05-29 ENCOUNTER — Telehealth: Payer: Self-pay | Admitting: Family

## 2021-05-29 NOTE — Telephone Encounter (Signed)
Patient has been approved for [place] patient assistance and medication has been shipped to patients house. ? ? ? ?Kassie Keng, NT ?

## 2021-06-02 ENCOUNTER — Other Ambulatory Visit (INDEPENDENT_AMBULATORY_CARE_PROVIDER_SITE_OTHER): Payer: Self-pay | Admitting: Vascular Surgery

## 2021-06-05 ENCOUNTER — Telehealth (INDEPENDENT_AMBULATORY_CARE_PROVIDER_SITE_OTHER): Payer: Self-pay

## 2021-06-05 NOTE — Telephone Encounter (Signed)
Made in error

## 2021-06-12 ENCOUNTER — Other Ambulatory Visit: Payer: Self-pay | Admitting: *Deleted

## 2021-06-12 MED ORDER — FERROUS SULFATE 325 (65 FE) MG PO TBEC: 325.0000 mg | DELAYED_RELEASE_TABLET | Freq: Two times a day (BID) | ORAL | 3 refills | Status: DC

## 2021-06-20 ENCOUNTER — Ambulatory Visit: Payer: Medicare Other | Admitting: Family

## 2021-06-22 ENCOUNTER — Ambulatory Visit: Payer: Medicare Other | Admitting: Family

## 2021-07-04 NOTE — Progress Notes (Unsigned)
Patient ID: Todd Whitehead, male    DOB: 07-09-1945, 76 y.o.   MRN: [id number]   Mr Teschner is a 76 y/o male with a history of HTN, anxiety, COPD, CAD (MI), PVD, current tobacco use and chronic heart failure.   Echo report from 01/27/21 Left ventricular ejection fraction, by estimation, is 45 to 50%. The left ventricle has mildly decreased function. The left ventricle demonstrates global hypokinesis. There is mild left ventricular hypertrophy of the basal-septal segment. Left ventricular diastolic parameters are consistent with Grade I diastolic dysfunction  LHC done 08/19/15 and showed: Mid LAD lesion, 80% stenosed. Mid Cx lesion, 85% stenosed. Mid RCA lesion, 100% stenosed. LM lesion, 50% stenosed. 3 vessel cad. Mildly reduced lv funciton  Admitted 01/26/21 due to acute on chronic CHF and CAP. New RLL mass found. Admitted 12/09/20 due to HF exacerbation. Initially given IV lasix with transition to oral diuretics. Needed supplemental oxygen. Chest CT showed severe emphysema signs of aspiration. Cardiology consult obtained. Discharged after 3 days on oxygen.    He presents today for follow up visit with a chief complaint of moderate shortness of breath with minimal exertion. Describes this as chronic in nature. Has associated fatigue, cough, anxiety and depression along with this. He denies any dizziness, difficulty sleeping, abdominal distention, palpitations, pedal edema, chest pain, wheezing or weight gain.   Has oxygen at home but says that he typically doesn't wear it.   Past Medical History:  Diagnosis Date   Anxiety    Arthritis    CHF (congestive heart failure) ([facility])    Colon polyp    COPD (chronic obstructive pulmonary disease) ([place])    smoker   Dysrhythmia    Glaucoma    Heart murmur    Hypertension    on  no meds   Low serum vitamin B12 11/18/2020   Melanoma ([place])    Resected from Left upper arm.    Myocardial infarction [facility]) 2017   Peripheral vascular disease [facility])     Prostate enlargement    Past Surgical History:  Procedure Laterality Date   BACK SURGERY  01/19/98   CARDIAC CATHETERIZATION N/A 08/19/2015   Procedure: Left Heart Cath and Coronary Angiography;  Surgeon: [name], MD;  Location: [place] [facility];  Service: Cardiovascular;  Laterality: N/A;   CHOLECYSTECTOMY N/A 08/19/2016   Procedure: LAPAROSCOPIC CHOLECYSTECTOMY WITH INTRAOPERATIVE CHOLANGIOGRAM;  Surgeon: [name], MD;  Location: [place];  Service: General;  Laterality: N/A;   COLON SURGERY  age 4 days old   " bowel was twisted up"   COLONOSCOPY WITH PROPOFOL N/A 03/19/2016   Procedure: COLONOSCOPY WITH PROPOFOL;  Surgeon: [name], MD;  Location[facility] ENDOSCOPY;  Service: Endoscopy;  Laterality: N/A;   CORONARY ARTERY BYPASS GRAFT  2017   ERCP N/A 08/18/2016   Procedure: ENDOSCOPIC RETROGRADE CHOLANGIOPANCREATOGRAPHY (ERCP);  Surgeon: [name], MD;  Location: [place];  Service: Gastroenterology;  Laterality: N/A;   ESOPHAGOGASTRODUODENOSCOPY (EGD) WITH PROPOFOL N/A 09/18/2018   Procedure: ESOPHAGOGASTRODUODENOSCOPY (EGD) WITH PROPOFOL;  Surgeon: [name], DO;  Location: [place];  Service: General;  Laterality: N/A;   EYE SURGERY Left 03/26/11   LOWER EXTREMITY ANGIOGRAPHY Left 10/03/2017   Procedure: LOWER EXTREMITY ANGIOGRAPHY;  Surgeon: [name], MD;  Location: [place] [facility];  Service: Cardiovascular;  Laterality: Left;   LOWER EXTREMITY ANGIOGRAPHY Right 10/31/2017   Procedure: LOWER EXTREMITY ANGIOGRAPHY;  Surgeon: [name], MD;  Location: [place] [facility];  Service: Cardiovascular;  Laterality: Right;   LUMBAR LAMINECTOMY/DECOMPRESSION MICRODISCECTOMY Right 10/02/2012   Procedure: Right Lumbar Five-Sacral One Lumbar laminotomy/Microdiskectomy;  Surgeon: [name], MD;  Location: [place] NEURO ORS;  Service: Neurosurgery;  Laterality: Right;  Right Lumbar Five-Sacral One Lumbar laminotomy/Microdiskectomy   VISCERAL ANGIOGRAPHY N/A  11/08/2020   Procedure: VISCERAL ANGIOGRAPHY;  Surgeon: Katha Cabal, MD;  Location: [place] [facility];  Service: Cardiovascular;  Laterality: N/A;   VISCERAL ANGIOGRAPHY N/A 11/25/2020   Procedure: VISCERAL ANGIOGRAPHY;  Surgeon: Katha Cabal, MD;  Location: [place] [facility];  Service: Cardiovascular;  Laterality: N/A;   Family History  Problem Relation Age of Onset   Heart attack Father    Aneurysm Brother    Lung cancer Brother    Social History   Tobacco Use   Smoking status: Every Day    Packs/day: 1.50    Years: 58.00    Pack years: 87.00    Types: Cigarettes   Smokeless tobacco: Never  Substance Use Topics   Alcohol use: No   Allergies  Allergen Reactions   Other Nausea And Vomiting    Cheese,butter,sour cream,cottage cheese   Poison Ivy Extract Rash   Poison Oak Extract Rash   Prior to Admission medications   Medication Sig Start Date End Date Taking? Authorizing Provider  acetaminophen (TYLENOL) 500 MG tablet Take 1 tablet (500 mg total) by mouth every 6 (six) hours as needed. Patient taking differently: Take 500 mg by mouth every 6 (six) hours as needed for moderate pain or headache. 08/20/16  Yes Waldron Session, MD  aspirin EC 81 MG tablet Take 81 mg by mouth daily. Swallow whole.   Yes [provider]  atorvastatin (LIPITOR) 40 MG tablet Take 40 mg by mouth daily.  05/22/17  Yes [provider]  brimonidine (ALPHAGAN) 0.2 % ophthalmic solution Place 1 drop into both eyes 2 (two) times daily. 02/14/16  Yes [provider]  busPIRone (BUSPAR) 30 MG tablet Take 30 mg by mouth 2 (two) times daily.   Yes [provider]  citalopram (CELEXA) 20 MG tablet Take 20 mg by mouth daily. 10/28/19  Yes [provider]  clopidogrel (PLAVIX) 75 MG tablet TAKE 1 TABLET(75 MG) BY MOUTH DAILY 06/05/21  Yes Schnier, Dolores Lory, MD  dapagliflozin propanediol (FARXIGA) 10 MG TABS tablet Take 1 tablet (10 mg total) by mouth daily  before breakfast. 05/23/21  Yes Darylene Price A, FNP  ferrous sulfate 325 (65 FE) MG EC tablet Take 1 tablet (325 mg total) by mouth 2 (two) times daily with a meal. 06/12/21  Yes Earlie Server, MD  latanoprost (XALATAN) 0.005 % ophthalmic solution Place 1 drop into both eyes at bedtime.  06/14/17  Yes [provider]  metoprolol succinate (TOPROL-XL) 25 MG 24 hr tablet Take 12.5 mg by mouth daily.   Yes [provider]  Multiple Vitamins-Minerals (MULTIVITAMIN WITH MINERALS) tablet Take 1 tablet by mouth daily.   Yes [provider]  QUEtiapine (SEROQUEL) 25 MG tablet Take 1 tablet (25 mg total) by mouth at bedtime. Patient taking differently: Take 25 mg by mouth 2 (two) times daily. 01/30/21  Yes Fritzi Mandes, MD  sacubitril-valsartan (ENTRESTO) 49-51 MG Take 1 tablet by mouth 2 (two) times daily. 05/23/21  Yes Darylene Price A, FNP  tamsulosin (FLOMAX) 0.4 MG CAPS capsule Take 1 capsule (0.4 mg total) by mouth daily after breakfast. 08/20/16  Yes Waldron Session, MD  traZODone (DESYREL) 50 MG tablet Take 50 mg by mouth at  bedtime. 06/02/19  Yes [provider]  albuterol (PROVENTIL) (2.5 MG/3ML) 0.083% nebulizer solution Take 3 mLs (2.5 mg total) by nebulization every 6 (six) hours as needed for wheezing or shortness of breath. Patient not taking: Reported on 07/05/2021 01/30/21   Fritzi Mandes, MD  budesonide-formoterol [facility]) 80-4.5 MCG/ACT inhaler Inhale 2 puffs into the lungs 2 (two) times daily. Patient not taking: Reported on 07/05/2021 12/12/20   Richarda Osmond, MD  Multiple Vitamin (MULTIVITAMIN WITH MINERALS) TABS tablet Take 1 tablet by mouth daily. 01/31/21   Fritzi Mandes, MD  traMADol (ULTRAM) 50 MG tablet Take 1 tablet (50 mg total) by mouth every 12 (twelve) hours as needed. Patient not taking: Reported on 07/05/2021 12/12/20   Richarda Osmond, MD   Review of Systems  Constitutional:  Positive for fatigue (improving). Negative for appetite change.  HENT:   Negative for congestion, postnasal drip and sore throat.   Eyes: Negative.   Respiratory:  Positive for cough and shortness of breath (with little exertion). Negative for chest tightness and wheezing.   Cardiovascular:  Negative for chest pain, palpitations and leg swelling.  Gastrointestinal:  Negative for abdominal distention and abdominal pain.  Endocrine: Negative.   Genitourinary: Negative.   Musculoskeletal:  Positive for neck pain. Negative for back pain.  Skin: Negative.   Allergic/Immunologic: Negative.   Neurological:  Negative for dizziness and light-headedness.  Hematological:  Negative for adenopathy. Does not bruise/bleed easily.  Psychiatric/Behavioral:  Positive for dysphoric mood. Negative for sleep disturbance (sleeping on 1 pillow). The patient is nervous/anxious.    Vitals:   07/05/21 1020  BP: 120/62  Pulse: 70  Resp: 16  SpO2: 100%  Weight: 118 lb 6 oz (53.7 kg)  Height: '5\' 8"'$  (1.727 m)   Wt Readings from Last 3 Encounters:  07/05/21 118 lb 6 oz (53.7 kg)  05/23/21 123 lb 6 oz (56 kg)  05/17/21 125 lb (56.7 kg)   Lab Results  Component Value Date   CREATININE 1.38 (H) 05/23/2021   CREATININE 1.22 05/10/2021   CREATININE 1.09 03/16/2021   Physical Exam Vitals and nursing note reviewed.  Constitutional:      General: He is not in acute distress.    Appearance: Normal appearance. He is ill-appearing. He is not toxic-appearing.  HENT:     Head: Normocephalic and atraumatic.  Cardiovascular:     Rate and Rhythm: Normal rate and regular rhythm.     Pulses: Normal pulses.     Heart sounds: No murmur heard. Pulmonary:     Effort: Pulmonary effort is normal. No tachypnea.     Breath sounds: No wheezing, rhonchi or rales.  Abdominal:     General: Abdomen is flat.     Palpations: Abdomen is soft.     Tenderness: There is no abdominal tenderness.  Musculoskeletal:        General: No tenderness.     Cervical back: Normal range of motion and neck supple.      Right lower leg: No tenderness. No edema.     Left lower leg: No tenderness. No edema.  Skin:    General: Skin is warm and dry.     Nails: There is clubbing.  Neurological:     General: No focal deficit present.     Mental Status: He is alert and oriented to person, place, and time.  Psychiatric:        Mood and Affect: Mood is anxious.        Behavior: Behavior  normal.        Thought Content: Thought content normal.    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)- - NYHA class III - euvolemic today - weighing daily; reminded to call for an overnight weight gain of > 2 pounds or a weekly weight gain of > 5 pounds - weight down 5 pounds from last visit here 6 weeks ago - not adding salt but does like saltier foods; likes to eat bacon every morning for breakfast, reviewed making lower sodium choices - saw cardiology Corky Sox) on 06/29/21 - on GDMT of metoprolol & entresto & farxiga - he has lab work ordered for 1 month from now - patient assistance for [employer] and farxiga filled out at last visit - BNP 12/09/20 was 1527.4 - PharmD reconciled medications with the patient  2: HTN- - BP looks good (120/62) - saw PCP Ouida Sills) 02/06/21 - BMP 05/23/21 reviewed and showed sodium 138, potassium 4.0, creatinine 1.38 & GFR 53  3: severe COPD- - has weaned himself off of oxygen - initial sat on room air was 72%; unable to get it to rise even with deep breaths; placed on 2L and it quickly rose to 100% - emphasized that he needed to wear his oxygen at home - saw pulmonology Raul Del) 05/03/21  4: Tobacco use- - smoking 1.0 ppd of cigarettes; at his peak he was smoking 3 ppd - emphasized to take off his oxygen, turn it off and remove himself from the oxygen if he chooses to smoke - has been smoking since the age of 63  5: Anemia - saw hematology Tasia Catchings) 05/17/21 - hemoglobin 05/10/21 was 10.6   Medication bottles reviewed.    Return in 3 months, sooner if needed

## 2021-07-05 ENCOUNTER — Ambulatory Visit: Payer: Medicare Other | Attending: Family | Admitting: Family

## 2021-07-05 ENCOUNTER — Encounter: Payer: Self-pay | Admitting: Pharmacist

## 2021-07-05 ENCOUNTER — Encounter: Payer: Self-pay | Admitting: Family

## 2021-07-05 VITALS — BP 120/62 | HR 70 | Resp 16 | Ht 68.0 in | Wt 118.4 lb

## 2021-07-05 DIAGNOSIS — I252 Old myocardial infarction: Secondary | ICD-10-CM | POA: Insufficient documentation

## 2021-07-05 DIAGNOSIS — Z7902 Long term (current) use of antithrombotics/antiplatelets: Secondary | ICD-10-CM | POA: Diagnosis not present

## 2021-07-05 DIAGNOSIS — Z79899 Other long term (current) drug therapy: Secondary | ICD-10-CM | POA: Insufficient documentation

## 2021-07-05 DIAGNOSIS — D649 Anemia, unspecified: Secondary | ICD-10-CM | POA: Diagnosis not present

## 2021-07-05 DIAGNOSIS — Z7982 Long term (current) use of aspirin: Secondary | ICD-10-CM | POA: Insufficient documentation

## 2021-07-05 DIAGNOSIS — J449 Chronic obstructive pulmonary disease, unspecified: Secondary | ICD-10-CM

## 2021-07-05 DIAGNOSIS — I2510 Atherosclerotic heart disease of native coronary artery without angina pectoris: Secondary | ICD-10-CM | POA: Insufficient documentation

## 2021-07-05 DIAGNOSIS — F17210 Nicotine dependence, cigarettes, uncomplicated: Secondary | ICD-10-CM | POA: Diagnosis not present

## 2021-07-05 DIAGNOSIS — F419 Anxiety disorder, unspecified: Secondary | ICD-10-CM | POA: Insufficient documentation

## 2021-07-05 DIAGNOSIS — Z9981 Dependence on supplemental oxygen: Secondary | ICD-10-CM | POA: Diagnosis not present

## 2021-07-05 DIAGNOSIS — Z720 Tobacco use: Secondary | ICD-10-CM

## 2021-07-05 DIAGNOSIS — I110 Hypertensive heart disease with heart failure: Secondary | ICD-10-CM | POA: Insufficient documentation

## 2021-07-05 DIAGNOSIS — I739 Peripheral vascular disease, unspecified: Secondary | ICD-10-CM | POA: Insufficient documentation

## 2021-07-05 DIAGNOSIS — Z951 Presence of aortocoronary bypass graft: Secondary | ICD-10-CM | POA: Diagnosis not present

## 2021-07-05 DIAGNOSIS — I10 Essential (primary) hypertension: Secondary | ICD-10-CM

## 2021-07-05 DIAGNOSIS — I5022 Chronic systolic (congestive) heart failure: Secondary | ICD-10-CM | POA: Insufficient documentation

## 2021-07-05 NOTE — Progress Notes (Signed)
[place] - PHARMACIST COUNSELING NOTE  *HFmEF*  Guideline-Directed Medical Therapy/Evidence Based Medicine  ACE/ARB/ARNI: Sacubitril-valsartan 49-51 mg twice daily Beta Blocker: Metoprolol succinate 12.5 mg daily Aldosterone Antagonist:  n/a Diuretic:  n/a SGLT2i: Farxiga '10mg'$  daily  Adherence Assessment  Do you ever forget to take your medication? '[]'$ Yes '[x]'$ No  Do you ever skip doses due to side effects? '[]'$ Yes '[x]'$ No  Do you have trouble affording your medicines? '[]'$ Yes '[x]'$ No  Are you ever unable to pick up your medication due to transportation difficulties? '[]'$ Yes '[x]'$ No  Do you ever stop taking your medications because you don't believe they are helping? '[]'$ Yes '[x]'$ No  Do you check your weight daily? '[x]'$ Yes '[]'$ No   Adherence strategy: pill box  Barriers to obtaining medications: none  Vital signs: HR 70, BP 120/62, weight (pounds) 118 lb  ECHO: Date 01/27/21, EF 45-50%, the left ventricle has mildly decreased function. The left ventricle demonstrates global hypokinesis.     Latest Ref Rng & Units 05/23/2021   10:15 AM 05/10/2021   11:45 AM 03/16/2021   11:24 AM  BMP  Glucose 70 - 99 mg/dL 86   113   102    BUN 8 - 23 mg/dL '25   18   12    '$ Creatinine 0.61 - 1.24 mg/dL 1.38   1.22   1.09    Sodium 135 - 145 mmol/L 138   135   135    Potassium 3.5 - 5.1 mmol/L 4.0   3.9   3.6    Chloride 98 - 111 mmol/L 106   103   104    CO2 22 - 32 mmol/L '25   25   25    '$ Calcium 8.9 - 10.3 mg/dL 9.2   9.1   8.8      Past Medical History:  Diagnosis Date   Anxiety    Arthritis    CHF (congestive heart failure) (HCC)    Colon polyp    COPD (chronic obstructive pulmonary disease) (HCC)    smoker   Dysrhythmia    Glaucoma    Heart murmur    Hypertension    on  no meds   Low serum vitamin B12 11/18/2020   Melanoma ([place])    Resected from Left upper arm.    Myocardial infarction [facility]) 2017   Peripheral vascular disease [facility])     Prostate enlargement     ASSESSMENT 76 year old male who presents to the HF clinic for follow up. HTN, anxiety, COPD, CAD (MI), PVD, current tobacco use and chronic heart failure. Patient reports compliance with current medication and denies ADR, dizziness, chest pain, or lightheadedness. Still non-compliance with home oxygen or inhaler. Last ED visit was 01/26/21 for COPD exacerbation.   Only new complaint id neck pain but already scheduled to follow up with PCP for pain management.  PLAN (recommendations)  Repeat BMET after starting Farxiga (noted PCP ordered complete lab work for July 18). Continue all medication as previously prescribed  Follow up as indicated by NP  Time spent: 15 minutes  Ivann Trimarco Rodriguez-Guzman PharmD, BCPS 07/05/2021 11:32 AM   Current Outpatient Medications:    acetaminophen (TYLENOL) 500 MG tablet, Take 1 tablet (500 mg total) by mouth every 6 (six) hours as needed. (Patient taking differently: Take 500 mg by mouth every 6 (six) hours as needed for moderate pain or headache.), Disp: 30 tablet, Rfl: 0   albuterol (PROVENTIL) (2.5 MG/3ML) 0.083% nebulizer solution, Take 3 mLs (  2.5 mg total) by nebulization every 6 (six) hours as needed for wheezing or shortness of breath. (Patient not taking: Reported on 07/05/2021), Disp: 75 mL, Rfl: 12   aspirin EC 81 MG tablet, Take 81 mg by mouth daily. Swallow whole., Disp: , Rfl:    atorvastatin (LIPITOR) 40 MG tablet, Take 40 mg by mouth daily. , Disp: , Rfl:    brimonidine (ALPHAGAN) 0.2 % ophthalmic solution, Place 1 drop into both eyes 2 (two) times daily., Disp: , Rfl:    budesonide-formoterol (SYMBICORT) 80-4.5 MCG/ACT inhaler, Inhale 2 puffs into the lungs 2 (two) times daily. (Patient not taking: Reported on 07/05/2021), Disp: 1 each, Rfl: 12   busPIRone (BUSPAR) 30 MG tablet, Take 30 mg by mouth 2 (two) times daily., Disp: , Rfl:    citalopram (CELEXA) 20 MG tablet, Take 20 mg by mouth daily., Disp: , Rfl:    clopidogrel  (PLAVIX) 75 MG tablet, TAKE 1 TABLET(75 MG) BY MOUTH DAILY, Disp: 30 tablet, Rfl: 5   dapagliflozin propanediol (FARXIGA) 10 MG TABS tablet, Take 1 tablet (10 mg total) by mouth daily before breakfast., Disp: 90 tablet, Rfl: 3   ferrous sulfate 325 (65 FE) MG EC tablet, Take 1 tablet (325 mg total) by mouth 2 (two) times daily with a meal., Disp: 60 tablet, Rfl: 3   latanoprost (XALATAN) 0.005 % ophthalmic solution, Place 1 drop into both eyes at bedtime. , Disp: , Rfl: 2   metoprolol succinate (TOPROL-XL) 25 MG 24 hr tablet, Take 12.5 mg by mouth daily., Disp: , Rfl:    Multiple Vitamin (MULTIVITAMIN WITH MINERALS) TABS tablet, Take 1 tablet by mouth daily., Disp: 30 tablet, Rfl: 0   Multiple Vitamins-Minerals (MULTIVITAMIN WITH MINERALS) tablet, Take 1 tablet by mouth daily., Disp: , Rfl:    QUEtiapine (SEROQUEL) 25 MG tablet, Take 1 tablet (25 mg total) by mouth at bedtime. (Patient taking differently: Take 25 mg by mouth 2 (two) times daily.), Disp: 30 tablet, Rfl: 0   sacubitril-valsartan (ENTRESTO) 49-51 MG, Take 1 tablet by mouth 2 (two) times daily., Disp: 180 tablet, Rfl: 3   tamsulosin (FLOMAX) 0.4 MG CAPS capsule, Take 1 capsule (0.4 mg total) by mouth daily after breakfast., Disp: 30 capsule, Rfl: 2   traMADol (ULTRAM) 50 MG tablet, Take 1 tablet (50 mg total) by mouth every 12 (twelve) hours as needed. (Patient not taking: Reported on 07/05/2021), Disp: 30 tablet, Rfl: 2   traZODone (DESYREL) 50 MG tablet, Take 50 mg by mouth at bedtime., Disp: , Rfl:    MEDICATION ADHERENCES TIPS AND STRATEGIES Taking medication as prescribed improves patient outcomes in heart failure (reduces hospitalizations, improves symptoms, increases survival) Side effects of medications can be managed by decreasing doses, switching agents, stopping drugs, or adding additional therapy. Please let someone in the [facility] know if you have having bothersome side effects so we can modify your regimen. Do not  alter your medication regimen without talking to [place].  Medication reminders can help patients remember to take drugs on time. If you are missing or forgetting doses you can try linking behaviors, using pill boxes, or an electronic reminder like an alarm on your phone or an app. Some people can also get automated phone calls as medication reminders.

## 2021-07-05 NOTE — Patient Instructions (Signed)
Continue weighing daily and call for an overnight weight gain of 3 pounds or more or a weekly weight gain of more than 5 pounds  If you have voicemail, please make sure your mailbox is cleaned out so that we may leave a message and please make sure to listen to any voicemails.    If you receive a satisfaction survey regarding the [facility], please take the time to fill it out. This way we can continue to provide excellent care and make any changes that need to be made.     

## 2021-08-03 ENCOUNTER — Emergency Department: Payer: Medicare Other

## 2021-08-03 ENCOUNTER — Encounter: Payer: Self-pay | Admitting: Emergency Medicine

## 2021-08-03 ENCOUNTER — Other Ambulatory Visit: Payer: Self-pay

## 2021-08-03 ENCOUNTER — Emergency Department
Admission: EM | Admit: 2021-08-03 | Discharge: 2021-08-03 | Disposition: A | Discharge status: Home / Self Care | Payer: Medicare Other | Attending: Emergency Medicine | Admitting: Emergency Medicine

## 2021-08-03 DIAGNOSIS — J441 Chronic obstructive pulmonary disease with (acute) exacerbation: Secondary | ICD-10-CM | POA: Insufficient documentation

## 2021-08-03 DIAGNOSIS — L298 Other pruritus: Secondary | ICD-10-CM | POA: Insufficient documentation

## 2021-08-03 DIAGNOSIS — Z7901 Long term (current) use of anticoagulants: Secondary | ICD-10-CM | POA: Diagnosis not present

## 2021-08-03 DIAGNOSIS — I509 Heart failure, unspecified: Secondary | ICD-10-CM | POA: Insufficient documentation

## 2021-08-03 DIAGNOSIS — Z951 Presence of aortocoronary bypass graft: Secondary | ICD-10-CM | POA: Insufficient documentation

## 2021-08-03 DIAGNOSIS — I2510 Atherosclerotic heart disease of native coronary artery without angina pectoris: Secondary | ICD-10-CM | POA: Diagnosis not present

## 2021-08-03 DIAGNOSIS — L299 Pruritus, unspecified: Secondary | ICD-10-CM

## 2021-08-03 LAB — COMPREHENSIVE METABOLIC PANEL
ALT: 22 U/L (ref 0–44)
AST: 28 U/L (ref 15–41)
Albumin: 3.4 g/dL — ABNORMAL LOW (ref 3.5–5.0)
Alkaline Phosphatase: 72 U/L (ref 38–126)
Anion gap: 8 (ref 5–15)
BUN: 14 mg/dL (ref 8–23)
CO2: 20 mmol/L — ABNORMAL LOW (ref 22–32)
Calcium: 8.8 mg/dL — ABNORMAL LOW (ref 8.9–10.3)
Chloride: 103 mmol/L (ref 98–111)
Creatinine, Ser: 1.15 mg/dL (ref 0.61–1.24)
GFR, Estimated: >60 mL/min (ref >60)
Glucose, Bld: 129 mg/dL — ABNORMAL HIGH (ref 70–99)
Potassium: 3.4 mmol/L — ABNORMAL LOW (ref 3.5–5.1)
Sodium: 131 mmol/L — ABNORMAL LOW (ref 135–145)
Total Bilirubin: 0.7 mg/dL (ref 0.3–1.2)
Total Protein: 6.9 g/dL (ref 6.5–8.1)

## 2021-08-03 LAB — CBC WITH DIFFERENTIAL/PLATELET
Abs Immature Granulocytes: 0.08 10*3/uL — ABNORMAL HIGH (ref 0.00–0.07)
Basophils Absolute: 0 10*3/uL (ref 0.0–0.1)
Basophils Relative: 0 %
Eosinophils Absolute: 0.1 10*3/uL (ref 0.0–0.5)
Eosinophils Relative: 1 %
HCT: 29.9 % — ABNORMAL LOW (ref 39.0–52.0)
Hemoglobin: 10.3 g/dL — ABNORMAL LOW (ref 13.0–17.0)
Immature Granulocytes: 1 %
Lymphocytes Relative: 12 %
Lymphs Abs: 1.3 10*3/uL (ref 0.7–4.0)
MCH: 31 pg (ref 26.0–34.0)
MCHC: 34.4 g/dL (ref 30.0–36.0)
MCV: 90.1 fL (ref 80.0–100.0)
Monocytes Absolute: 1 10*3/uL (ref 0.1–1.0)
Monocytes Relative: 9 %
Neutro Abs: 8.3 10*3/uL — ABNORMAL HIGH (ref 1.7–7.7)
Neutrophils Relative %: 77 %
Platelets: 240 10*3/uL (ref 150–400)
RBC: 3.32 MIL/uL — ABNORMAL LOW (ref 4.22–5.81)
RDW: 13.9 % (ref 11.5–15.5)
WBC: 10.7 10*3/uL — ABNORMAL HIGH (ref 4.0–10.5)
nRBC: 0 % (ref 0.0–0.2)

## 2021-08-03 LAB — TROPONIN I (HIGH SENSITIVITY)
Troponin I (High Sensitivity): 13 ng/L (ref <18)
Troponin I (High Sensitivity): 12 ng/L (ref <18)

## 2021-08-03 LAB — BRAIN NATRIURETIC PEPTIDE: B Natriuretic Peptide: 152.5 pg/mL — ABNORMAL HIGH (ref 0.0–100.0)

## 2021-08-03 MED ORDER — IPRATROPIUM-ALBUTEROL 0.5-2.5 (3) MG/3ML IN SOLN
6.0000 mL | Freq: Once | RESPIRATORY_TRACT | Status: AC
  Administered 2021-08-03: 6 mL via RESPIRATORY_TRACT
  Filled 2021-08-03: qty 6

## 2021-08-03 MED ORDER — METHYLPREDNISOLONE SODIUM SUCC 125 MG IJ SOLR
125.0000 mg | Freq: Once | INTRAMUSCULAR | Status: AC
Start: 2021-08-03 — End: 2021-08-03
  Administered 2021-08-03: 125 mg via INTRAVENOUS
  Filled 2021-08-03: qty 2

## 2021-08-03 MED ORDER — PREDNISONE 10 MG (21) PO TBPK: ORAL_TABLET | ORAL | 0 refills | Status: AC

## 2021-08-03 MED ORDER — DIPHENHYDRAMINE HCL 25 MG PO CAPS
25.0000 mg | ORAL_CAPSULE | Freq: Once | ORAL | Status: AC
  Administered 2021-08-03: 25 mg via ORAL
  Filled 2021-08-03: qty 1

## 2021-08-03 NOTE — ED Triage Notes (Addendum)
Pt to triage via w/c; reports itching to arms x 2wks with no known cause; no meds taken PTA for such; st called his PCP and was told to come here if any worse; applied O2 at 3l/min via [place] in triage (pt wears at home and arrived without)

## 2021-08-03 NOTE — ED Provider Notes (Signed)
Procedures  Clinical Course as of 08/03/21 1027  Thu Aug 03, 2021  0646 Reassessed.  Patient reports feeling better.  Tachypnea resolved [DS]    Clinical Course User Index [DS] Vladimir Crofts, MD    ----------------------------------------- 10:27 AM on 08/03/2021 -----------------------------------------  Patient still feeling well.  Currently breathing comfortably, clear to auscultation bilaterally.  Feels ready to go home.  Work-up is entirely reassuring, chest x-ray and labs including serial troponins are unremarkable.  Does not require admission and can continue outpatient management.    Carrie Mew, MD 08/03/21 1028

## 2021-08-03 NOTE — ED Provider Notes (Signed)
[facility] Provider Note    Event Date/Time   First MD Initiated Contact with Patient 08/03/21 [phone]     (approximate)   History   Pruritis   HPI  Todd Whitehead is a 76 y.o. male who presents to the ED for evaluation of Pruritis   I reviewed cardiology clinic visit from 6/1.  History of CAD s/p CABG, CHF, COPD with chronic 3 L .  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  Plavix without anticoagulation.  Patient presents with his niece for evaluation of pruritus diffusely for few weeks.  Niece provides some very helpful supplemental history that he has been short of breath and coughing a lot more than normal and that is why she "dragged him in."  He reluctantly agrees that his breathing does feel worse.  Denies fever or increased sputum production.  Denies chest pain, syncope.  He reports his pruritus is mostly just at night when he is laying in bed.  He is taken no medications for it.  No known bug bites, no rashes or exposures  Physical Exam   Triage Vital Signs: ED Triage Vitals  Enc Vitals Group     BP 08/03/21 0417 (!) 117/55     Pulse Rate 08/03/21 0417 85     Resp 08/03/21 0417 (!) 22     Temp 08/03/21 0417 97.9 F (36.6 C)     Temp Source 08/03/21 0417 Oral     SpO2 08/03/21 0417 96 %     Weight 08/03/21 0416 110 lb (49.9 kg)     Height 08/03/21 0416 '5\' 8"'$  (1.727 m)     Head Circumference --      Peak Flow --      Pain Score 08/03/21 0417 0     Pain Loc --      Pain Edu? --      Excl. in [place]? --     Most recent vital signs: Vitals:   08/03/21 0417  BP: (!) 117/55  Pulse: 85  Resp: (!) 22  Temp: 97.9 F (36.6 C)  SpO2: 96%    General: Awake, no distress.  Tachypneic and dyspneic, frequently coughing, dry cough. CV:  Good peripheral perfusion. RRR Resp:  Tachypneic to the mid 20s.  Diffuse expiratory wheezes without focal features Abd:  No distention.  Soft and benign MSK:  No deformity noted.  No peripheral edema  appreciated Neuro:  No focal deficits appreciated. Other:  No rash or hives or skin changes noted.  Some excoriations to the volar right forearm where he admits to scratching quite a bit.   ED Results / Procedures / Treatments   Labs (all labs ordered are listed, but only abnormal results are displayed) Labs Reviewed  CBC WITH DIFFERENTIAL/PLATELET - Abnormal; Notable for the following components:      Result Value   WBC 10.7 (*)    RBC 3.32 (*)    Hemoglobin 10.3 (*)    HCT 29.9 (*)    Neutro Abs 8.3 (*)    Abs Immature Granulocytes 0.08 (*)    All other components within normal limits  COMPREHENSIVE METABOLIC PANEL  BRAIN NATRIURETIC PEPTIDE  TROPONIN I (HIGH SENSITIVITY)    EKG Sinus rhythm with a rate of 72 bpm.  Normal axis.  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  No STEMI.  Nonspecific ST changes laterally  RADIOLOGY 1 view CXR interpreted by me with chronic interstitial changes without clear signs of acute infiltrate.  No PTX.  Official radiology report(s): No results found.  PROCEDURES and INTERVENTIONS:  .1-3 Lead EKG Interpretation  Performed by: Vladimir Crofts, MD Authorized by: Vladimir Crofts, MD     Interpretation: normal     ECG rate:  80   ECG rate assessment: normal     Rhythm: sinus rhythm     Ectopy: none     Conduction: normal     Medications  ipratropium-albuterol (DUONEB) 0.5-2.5 (3) MG/3ML nebulizer solution 6 mL (6 mLs Nebulization Given 08/03/21 0627)  methylPREDNISolone sodium succinate (SOLU-MEDROL) 125 mg/2 mL injection 125 mg (125 mg Intravenous Given 08/03/21 0627)  diphenhydrAMINE (BENADRYL) capsule 25 mg (25 mg Oral Given 08/03/21 4854)     IMPRESSION / MDM / ASSESSMENT AND PLAN / ED COURSE  I reviewed the triage vital signs and the nursing notes.  Differential diagnosis includes, but is not limited to, ACS, PTX, PNA, muscle strain/spasm, PE, dissection  {Patient presents with symptoms of an acute illness or injury that is  potentially life-threatening.  76 year old male with chronic respiratory failure presents to the ED with shortness of breath and pruritus.  He has evidence of a COPD exacerbation on exam.  He is not grossly volume overloaded.  No fevers or vital sign derangements beyond very mild tachypnea.  He is initially concerned about subacute-chronic generalized pruritus without overlying rash or signs of acute derangements, but is noted to be tachypneic and his niece reports being admitted due to his respiratory status.  He is improving with breathing treatment and supportive measures on his chronic 3 L.  He will be signed out to [employer] to follow-up on the remainder of his blood work and reassess the patient for possibly going home with a steroid taper.  Clinical Course as of 08/03/21 0649  Thu Aug 03, 2021  0646 Reassessed.  Patient reports feeling better.  Tachypnea resolved [DS]    Clinical Course User Index [DS] Vladimir Crofts, MD     FINAL CLINICAL IMPRESSION(S) / ED DIAGNOSES   Final diagnoses:  COPD exacerbation ([place])  Pruritus     Rx / DC Orders   ED Discharge Orders     None        Note:  This document was prepared using Dragon voice recognition software and may include unintentional dictation errors.   Vladimir Crofts, MD 08/03/21 [phone]

## 2021-08-15 ENCOUNTER — Inpatient Hospital Stay: Payer: Medicare Other

## 2021-08-16 ENCOUNTER — Inpatient Hospital Stay: Payer: Medicare Other

## 2021-08-16 ENCOUNTER — Encounter: Payer: Self-pay | Admitting: Oncology

## 2021-08-16 ENCOUNTER — Inpatient Hospital Stay: Payer: Medicare Other | Attending: Oncology | Admitting: Oncology

## 2021-08-16 VITALS — BP 91/55 | HR 84 | Temp 96.7°F | Resp 20 | Ht 68.0 in | Wt 113.0 lb

## 2021-08-16 DIAGNOSIS — Z801 Family history of malignant neoplasm of trachea, bronchus and lung: Secondary | ICD-10-CM | POA: Insufficient documentation

## 2021-08-16 DIAGNOSIS — E871 Hypo-osmolality and hyponatremia: Secondary | ICD-10-CM | POA: Diagnosis not present

## 2021-08-16 DIAGNOSIS — D649 Anemia, unspecified: Secondary | ICD-10-CM | POA: Diagnosis not present

## 2021-08-16 DIAGNOSIS — D472 Monoclonal gammopathy: Secondary | ICD-10-CM

## 2021-08-16 DIAGNOSIS — E538 Deficiency of other specified B group vitamins: Secondary | ICD-10-CM | POA: Diagnosis not present

## 2021-08-16 DIAGNOSIS — J439 Emphysema, unspecified: Secondary | ICD-10-CM | POA: Insufficient documentation

## 2021-08-16 DIAGNOSIS — I2510 Atherosclerotic heart disease of native coronary artery without angina pectoris: Secondary | ICD-10-CM | POA: Diagnosis not present

## 2021-08-16 DIAGNOSIS — F17210 Nicotine dependence, cigarettes, uncomplicated: Secondary | ICD-10-CM | POA: Diagnosis not present

## 2021-08-16 DIAGNOSIS — I5023 Acute on chronic systolic (congestive) heart failure: Secondary | ICD-10-CM | POA: Diagnosis not present

## 2021-08-16 DIAGNOSIS — R197 Diarrhea, unspecified: Secondary | ICD-10-CM | POA: Diagnosis not present

## 2021-08-16 DIAGNOSIS — I110 Hypertensive heart disease with heart failure: Secondary | ICD-10-CM | POA: Insufficient documentation

## 2021-08-16 LAB — IRON AND TIBC
Iron: 89 ug/dL (ref 45–182)
Saturation Ratios: 30 % (ref 17.9–39.5)
TIBC: 293 ug/dL (ref 250–450)
UIBC: 204 ug/dL

## 2021-08-16 LAB — COMPREHENSIVE METABOLIC PANEL
ALT: 26 U/L (ref 0–44)
AST: 28 U/L (ref 15–41)
Albumin: 3.7 g/dL (ref 3.5–5.0)
Alkaline Phosphatase: 74 U/L (ref 38–126)
Anion gap: 6 (ref 5–15)
BUN: 24 mg/dL — ABNORMAL HIGH (ref 8–23)
CO2: 23 mmol/L (ref 22–32)
Calcium: 8.7 mg/dL — ABNORMAL LOW (ref 8.9–10.3)
Chloride: 100 mmol/L (ref 98–111)
Creatinine, Ser: 1.31 mg/dL — ABNORMAL HIGH (ref 0.61–1.24)
GFR, Estimated: 56 mL/min — ABNORMAL LOW (ref >60)
Glucose, Bld: 129 mg/dL — ABNORMAL HIGH (ref 70–99)
Potassium: 4.3 mmol/L (ref 3.5–5.1)
Sodium: 129 mmol/L — ABNORMAL LOW (ref 135–145)
Total Bilirubin: 0.6 mg/dL (ref 0.3–1.2)
Total Protein: 6.8 g/dL (ref 6.5–8.1)

## 2021-08-16 LAB — CBC WITH DIFFERENTIAL/PLATELET
Abs Immature Granulocytes: 0.08 10*3/uL — ABNORMAL HIGH (ref 0.00–0.07)
Basophils Absolute: 0 10*3/uL (ref 0.0–0.1)
Basophils Relative: 0 %
Eosinophils Absolute: 0 10*3/uL (ref 0.0–0.5)
Eosinophils Relative: 0 %
HCT: 31.3 % — ABNORMAL LOW (ref 39.0–52.0)
Hemoglobin: 10.7 g/dL — ABNORMAL LOW (ref 13.0–17.0)
Immature Granulocytes: 1 %
Lymphocytes Relative: 12 %
Lymphs Abs: 0.9 10*3/uL (ref 0.7–4.0)
MCH: 31.5 pg (ref 26.0–34.0)
MCHC: 34.2 g/dL (ref 30.0–36.0)
MCV: 92.1 fL (ref 80.0–100.0)
Monocytes Absolute: 0.2 10*3/uL (ref 0.1–1.0)
Monocytes Relative: 3 %
Neutro Abs: 6.4 10*3/uL (ref 1.7–7.7)
Neutrophils Relative %: 84 %
Platelets: 324 10*3/uL (ref 150–400)
RBC: 3.4 MIL/uL — ABNORMAL LOW (ref 4.22–5.81)
RDW: 14.4 % (ref 11.5–15.5)
WBC: 7.7 10*3/uL (ref 4.0–10.5)
nRBC: 0 % (ref 0.0–0.2)

## 2021-08-16 LAB — VITAMIN B12: Vitamin B-12: 698 pg/mL (ref 180–914)

## 2021-08-16 LAB — FERRITIN: Ferritin: 171 ng/mL (ref 24–336)

## 2021-08-16 MED ORDER — SODIUM CHLORIDE 0.9 % IV SOLN
INTRAVENOUS | Status: DC
  Filled 2021-08-16 (×3): qty 250

## 2021-08-16 NOTE — Progress Notes (Signed)
Hematology/Oncology Progress note [phone] [phone]      Patient Care Team: [name], MD as PCP - General (Internal Medicine) Earlie Server, MD as Consulting Physician (Hematology and Oncology)  REFERRING PROVIDER: [name], MD  CHIEF COMPLAINTS/REASON FOR VISIT:  Anemia and vitamin B12 deficiency  HISTORY OF PRESENTING ILLNESS:   Todd Whitehead is a  76 y.o.  male with PMH listed below was seen in consultation at the request of  [name], MD  for evaluation of anemia. Patient was accompanied by her friend Ms. Williams. 8/31 2022, CBC showed a hemoglobin of 10.3, hematocrit 29.5, WBC 5.7  10/20/2020, iron panel showed decreased iron saturation of 12, ferritin 140, TIBC 265.  Patient reports feeling fatigued and tired.  Denies any hematemesis or hematochezia. Patient has chronic abdominal pain, and only is able to eat 1 meal per day.  Patient has significant weight loss as well as some nausea.  07/06/2020 CT abdomen pelvis showed no acute findings.  No explanation of patient's unintended weight loss.  Mucous plugging seen within the right lower lobe bronchi with minimal right base atelectasis or early infiltrate. Patient follows up with gastroenterology Dr.Tahiliani, has also been seen by surgery to [facility]  Surgical intervention was not recommended.   09/13/2020, CT abdomen pelvis with contrast at [facility] showed extensive atherosclerotic disease.  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  Previous cholecystectomy.  Prominence, bile duct.  Cyst of both kidneys.  Negative for hydronephrosis.  Moderate distention urinary bladder.  Questionable bladder outlet obstruction.  Prostate gland is mildly enlarged and heterogeneous. Patient was last seen by Dr. Bonna Gains on 10/20/2020.  GI recommends EGD and colonoscopy for further evaluation of his symptoms. Patient was seen by vascular surgery Dr.Schnier on 10/24/2020.  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 11/08/2020, aortogram showed a diffusely diseased no hemodynamically significant lesions identified.  SMA was widely patent.  Celiac demonstrates greater than 90% stenosis at its origin but distal to the stenosis appears to be widely patent.  Patient will be rescheduled with plan of upper extremity access which will allow leverage enabling treatment of the lesion and revascularization and stent placement.  Patient was referred to establish care with hematology for evaluation of anemia.  01/26/2021 - 01/30/2021, patient was hospitalized due to hemoptysis, acute on chronic systolic CHF, community-acquired pneumonia, acute hypoxic respiratory failure.  2D echo showed EF 30-86%, grade 1 diastolic dysfunction.  Patient follows up with pulmonology for a suspicious nodule in his lung.  04/27/2021, CT chest without contrast showed interval resolution of the previously seen spiculated nodule of the dependent right lung base.  As well as bilateral airspace disease.  Consistent with resolution of infection or inflammation.  Severe emphysema, diffuse bronchial wall thickening.  CAD.  INTERVAL HISTORY Todd Whitehead is a 76 y.o. male who has above history reviewed by me today presents for follow up of MGUS, anemia He had diarrhea this morning, not feeling well. He has had 3 loose bowel movements.  No fever, chills, nausea, vomiting. + chronic cough, not worse from prior.   Review of Systems  Constitutional:  Positive for fatigue. Negative for appetite change, chills, diaphoresis, fever and unexpected weight change.  HENT:   Negative for hearing loss, lump/mass, nosebleeds and sore throat.   Eyes:  Negative for eye problems and icterus.  Respiratory:  Positive for shortness of breath. Negative for chest tightness, cough, hemoptysis and wheezing.   Cardiovascular:  Negative for chest  pain and leg swelling.  Gastrointestinal:  Positive for diarrhea. Negative for abdominal distention,  abdominal pain, blood in stool, nausea and rectal pain.  Endocrine: Negative for hot flashes.  Genitourinary:  Negative for bladder incontinence, difficulty urinating, dysuria, frequency, hematuria and nocturia.   Musculoskeletal:  Negative for back pain, flank pain, gait problem and myalgias.  Skin:  Negative for rash.  Neurological:  Negative for dizziness, gait problem, headaches, numbness and seizures.  Hematological:  Negative for adenopathy. Does not bruise/bleed easily.  Psychiatric/Behavioral:  Negative for confusion and decreased concentration. The patient is not nervous/anxious.     MEDICAL HISTORY:  Past Medical History:  Diagnosis Date   Anxiety    Arthritis    CHF (congestive heart failure) (HCC)    Colon polyp    COPD (chronic obstructive pulmonary disease) ([place])    smoker   Dysrhythmia    Glaucoma    Heart murmur    Hypertension    on  no meds   Low serum vitamin B12 11/18/2020   Melanoma ([place])    Resected from Left upper arm.    Myocardial infarction [facility]) 2017   Peripheral vascular disease [facility])    Prostate enlargement     SURGICAL HISTORY: Past Surgical History:  Procedure Laterality Date   BACK SURGERY  01/19/98   CARDIAC CATHETERIZATION N/A 08/19/2015   Procedure: Left Heart Cath and Coronary Angiography;  Surgeon: [name], MD;  Location: [place] [facility];  Service: Cardiovascular;  Laterality: N/A;   CHOLECYSTECTOMY N/A 08/19/2016   Procedure: LAPAROSCOPIC CHOLECYSTECTOMY WITH INTRAOPERATIVE CHOLANGIOGRAM;  Surgeon: [name], MD;  Location: [place];  Service: General;  Laterality: N/A;   COLON SURGERY  age 33 days old   " bowel was twisted up"   COLONOSCOPY WITH PROPOFOL N/A 03/19/2016   Procedure: COLONOSCOPY WITH PROPOFOL;  Surgeon: [name], MD;  Location: [facility] ENDOSCOPY;  Service: Endoscopy;  Laterality: N/A;   CORONARY ARTERY BYPASS GRAFT  2017   ERCP N/A 08/18/2016   Procedure: ENDOSCOPIC RETROGRADE CHOLANGIOPANCREATOGRAPHY  (ERCP);  Surgeon: [name], MD;  Location: [place];  Service: Gastroenterology;  Laterality: N/A;   ESOPHAGOGASTRODUODENOSCOPY (EGD) WITH PROPOFOL N/A 09/18/2018   Procedure: ESOPHAGOGASTRODUODENOSCOPY (EGD) WITH PROPOFOL;  Surgeon: [name], DO;  Location: [place];  Service: General;  Laterality: N/A;   EYE SURGERY Left 03/26/11   LOWER EXTREMITY ANGIOGRAPHY Left 10/03/2017   Procedure: LOWER EXTREMITY ANGIOGRAPHY;  Surgeon: [name], MD;  Location: [place] [facility];  Service: Cardiovascular;  Laterality: Left;   LOWER EXTREMITY ANGIOGRAPHY Right 10/31/2017   Procedure: LOWER EXTREMITY ANGIOGRAPHY;  Surgeon: [name], MD;  Location: [place] [facility];  Service: Cardiovascular;  Laterality: Right;   LUMBAR LAMINECTOMY/DECOMPRESSION MICRODISCECTOMY Right 10/02/2012   Procedure: Right Lumbar Five-Sacral One Lumbar laminotomy/Microdiskectomy;  Surgeon: [name], MD;  Location: [place] NEURO [facility]S;  Service: Neurosurgery;  Laterality: Right;  Right Lumbar Five-Sacral One Lumbar laminotomy/Microdiskectomy   VISCERAL ANGIOGRAPHY N/A 11/08/2020   Procedure: VISCERAL ANGIOGRAPHY;  Surgeon: Katha Cabal, MD;  Location: [place] [facility];  Service: Cardiovascular;  Laterality: N/A;   VISCERAL ANGIOGRAPHY N/A 11/25/2020   Procedure: VISCERAL ANGIOGRAPHY;  Surgeon: Katha Cabal, MD;  Location: [place] [facility];  Service: Cardiovascular;  Laterality: N/A;    SOCIAL HISTORY: Social History   Socioeconomic History   Marital status: Divorced    Spouse name: Not on file   Number of children: Not on file   Years of education:  Not on file   Highest education level: Not on file  Occupational History   Not on file  Tobacco Use   Smoking status: Every Day    Packs/day: 1.50    Years: 58.00    Total pack years: 87.00    Types: Cigarettes   Smokeless tobacco: Never  Vaping Use   Vaping Use: Former  Substance and Sexual Activity   Alcohol use: No   Drug  use: No   Sexual activity: Not Currently  Other Topics Concern   Not on file  Social History Narrative   Not on file   Social Determinants of Health   Financial Resource Strain: Not on file  Food Insecurity: Not on file  Transportation Needs: Not on file  Physical Activity: Not on file  Stress: Not on file  Social Connections: Not on file  Intimate Partner Violence: Not on file    FAMILY HISTORY: Family History  Problem Relation Age of Onset   Heart attack Father    Aneurysm Brother    Lung cancer Brother     ALLERGIES:  is allergic to other, poison ivy extract, and poison oak extract.  MEDICATIONS:  Current Outpatient Medications  Medication Sig Dispense Refill   acetaminophen (TYLENOL) 500 MG tablet Take 1 tablet (500 mg total) by mouth every 6 (six) hours as needed. (Patient taking differently: Take 500 mg by mouth every 6 (six) hours as needed for moderate pain or headache.) 30 tablet 0   albuterol (PROVENTIL) (2.5 MG/3ML) 0.083% nebulizer solution Take 3 mLs (2.5 mg total) by nebulization every 6 (six) hours as needed for wheezing or shortness of breath. 75 mL 12   aspirin EC 81 MG tablet Take 81 mg by mouth daily. Swallow whole.     atorvastatin (LIPITOR) 40 MG tablet Take 40 mg by mouth daily.      brimonidine (ALPHAGAN) 0.2 % ophthalmic solution Place 1 drop into both eyes 2 (two) times daily.     busPIRone (BUSPAR) 30 MG tablet Take 30 mg by mouth 2 (two) times daily.     citalopram (CELEXA) 20 MG tablet Take 20 mg by mouth daily.     clopidogrel (PLAVIX) 75 MG tablet TAKE 1 TABLET(75 MG) BY MOUTH DAILY 30 tablet 5   dapagliflozin propanediol (FARXIGA) 10 MG TABS tablet Take 1 tablet (10 mg total) by mouth daily before breakfast. 90 tablet 3   ferrous sulfate 325 (65 FE) MG EC tablet Take 1 tablet (325 mg total) by mouth 2 (two) times daily with a meal. 60 tablet 3   latanoprost (XALATAN) 0.005 % ophthalmic solution Place 1 drop into both eyes at bedtime.   2    metoprolol succinate (TOPROL-XL) 25 MG 24 hr tablet Take 12.5 mg by mouth daily.     Multiple Vitamin (MULTIVITAMIN WITH MINERALS) TABS tablet Take 1 tablet by mouth daily. 30 tablet 0   Multiple Vitamins-Minerals (MULTIVITAMIN WITH MINERALS) tablet Take 1 tablet by mouth daily.     QUEtiapine (SEROQUEL) 25 MG tablet Take 1 tablet (25 mg total) by mouth at bedtime. (Patient taking differently: Take 25 mg by mouth 2 (two) times daily.) 30 tablet 0   sacubitril-valsartan (ENTRESTO) 49-51 MG Take 1 tablet by mouth 2 (two) times daily. 180 tablet 3   tamsulosin (FLOMAX) 0.4 MG CAPS capsule Take 1 capsule (0.4 mg total) by mouth daily after breakfast. 30 capsule 2   traMADol (ULTRAM) 50 MG tablet Take 1 tablet (50 mg total) by mouth every 12 (  twelve) hours as needed. 30 tablet 2   traZODone (DESYREL) 50 MG tablet Take 50 mg by mouth at bedtime.     budesonide-formoterol (SYMBICORT) 80-4.5 MCG/ACT inhaler Inhale 2 puffs into the lungs 2 (two) times daily. (Patient not taking: Reported on 08/16/2021) 1 each 12   No current facility-administered medications for this visit.   Facility-Administered Medications Ordered in Other Visits  Medication Dose Route Frequency Provider Last Rate Last Admin   0.9 %  sodium chloride infusion   Intravenous Continuous Earlie Server, MD   Stopped at 08/16/21 1507     PHYSICAL EXAMINATION: ECOG PERFORMANCE STATUS: 3 - Symptomatic, >50% confined to bed Vitals:   08/16/21 1315  BP: (!) 91/55  Pulse: 84  Resp: 20  Temp: (!) 96.7 F (35.9 C)  SpO2: 96%   Filed Weights   08/16/21 1315  Weight: 113 lb (51.3 kg)    Physical Exam Constitutional:      General: He is not in acute distress.    Comments: Patient ambulates independently  HENT:     Head: Normocephalic and atraumatic.  Eyes:     General: No scleral icterus. Cardiovascular:     Rate and Rhythm: Normal rate and regular rhythm.     Heart sounds: Normal heart sounds.  Pulmonary:     Effort: Pulmonary  effort is normal.     Comments: Decreased breath sound bilaterally. Abdominal:     General: Bowel sounds are normal. There is no distension.     Palpations: Abdomen is soft.  Musculoskeletal:        General: No deformity. Normal range of motion.     Cervical back: Normal range of motion and neck supple.  Skin:    General: Skin is warm and dry.     Findings: No erythema or rash.  Neurological:     Mental Status: He is alert. Mental status is at baseline.     Cranial Nerves: No cranial nerve deficit.     Coordination: Coordination normal.  Psychiatric:        Mood and Affect: Mood normal.     LABORATORY DATA:  I have reviewed the data as listed Lab Results  Component Value Date   WBC 7.7 08/16/2021   HGB 10.7 (L) 08/16/2021   HCT 31.3 (L) 08/16/2021   MCV 92.1 08/16/2021   PLT 324 08/16/2021   Recent Labs    05/10/21 1145 05/23/21 1015 08/03/21 0618 08/16/21 1256  NA 135 138 131* 129*  K 3.9 4.0 3.4* 4.3  CL 103 106 103 100  CO2 25 25 20* 23  GLUCOSE 113* 86 129* 129*  BUN 18 25* 14 24*  CREATININE 1.22 1.38* 1.15 1.31*  CALCIUM 9.1 9.2 8.8* 8.7*  GFRNONAA >60 53* >60 56*  PROT 6.6  --  6.9 6.8  ALBUMIN 3.8  --  3.4* 3.7  AST 32  --  28 28  ALT 20  --  22 26  ALKPHOS 79  --  72 74  BILITOT 0.5  --  0.7 0.6    Iron/TIBC/Ferritin/ %Sat    Component Value Date/Time   IRON 89 08/16/2021 1256   IRON 33 (L) 10/20/2020 1335   TIBC 293 08/16/2021 1256   TIBC 265 10/20/2020 1335   FERRITIN 171 08/16/2021 1256   FERRITIN 140 10/20/2020 1335   IRONPCTSAT 30 08/16/2021 1256   IRONPCTSAT 12 (L) 10/20/2020 1335   11/18/2020 multiple myeloma panel showed M protein of 0.4, IgG kappa MGUS.  Light Chain  ratio 2.45.  24-hour UPEP showed no M protein.. 05/10/2021, multiple myeloma showed negative M protein.  Immunofixation is positive for IgG monoclonal protein with kappa light chain specificity.  Light chain ratio is 2.1.    RADIOGRAPHIC STUDIES: I have personally  reviewed the radiological images as listed and agreed with the findings in the report. DG Chest Portable 1 View  Result Date: 08/03/2021 CLINICAL DATA:  COPD exacerbation. EXAM: PORTABLE CHEST 1 VIEW COMPARISON:  CT 04/26/2021 FINDINGS: Heart size appears normal. Previous median sternotomy and CABG procedure. The lungs are hyperinflated with diffuse coarsened interstitial markings of COPD/emphysema. No superimposed airspace consolidation, pleural effusion or pneumothorax. Increased septal lines overlying the left lower lobe may reflect mild interstitial edema. IMPRESSION: 1. Hyperinflation with interstitial changes of COPD/emphysema. 2. Increased septal lines overlying the left lower lobe may reflect mild interstitial edema. Electronically Signed   By: Kerby Moors M.D.   On: 08/03/2021 06:52      ASSESSMENT & PLAN:  1. MGUS (monoclonal gammopathy of unknown significance)   2. Anemia, unspecified type   3. Diarrhea, unspecified type   4. Hyponatremia     # Diarrhea, etiology is unknown.  He is not able to provide a stool sample in the clinic. He has appt with PCP tomorrow.    # Hyponatremia, Na 129, likely due to diarrhea, IVF hydration x 1 today.    #IgG kappa MGUS He did not get labs prior to today's appointment.  Today's myeloma panel is pending.   Recommend continue observation.   # History of melenoma, dermatology follow-up annually.  Orders Placed This Encounter  Procedures   CBC with Differential/Platelet    Standing Status:   Future    Standing Expiration Date:   08/17/2022   Comprehensive metabolic panel    Standing Status:   Future    Standing Expiration Date:   08/17/2022   Multiple Myeloma Panel (SPEP&IFE w/QIG)    Standing Status:   Future    Standing Expiration Date:   08/16/2022   Kappa/lambda light chains    Standing Status:   Future    Standing Expiration Date:   08/16/2022    All questions were answered. The patient knows to call the clinic with any problems  questions or concerns.  cc [name], MD    Return of visit:  Follow-up lab MD  in 6 months, labs prior.  CBC CMP vitamin B12, iron, TIBC ferritin, multiple myeloma panel, light chain ratio.   Earlie Server, MD, PhD 08/16/2021

## 2021-08-16 NOTE — Progress Notes (Signed)
Patient added on for IVF's. 500 ml NS over 1 hour per peripheral IV left upper arm. Discharged to home at completion.

## 2021-08-17 LAB — KAPPA/LAMBDA LIGHT CHAINS
Kappa free light chain: 28.5 mg/L — ABNORMAL HIGH (ref 3.3–19.4)
Kappa, lambda light chain ratio: 1 (ref 0.26–1.65)
Lambda free light chains: 28.4 mg/L — ABNORMAL HIGH (ref 5.7–26.3)

## 2021-08-21 LAB — MULTIPLE MYELOMA PANEL, SERUM
Albumin SerPl Elph-Mcnc: 3.4 g/dL (ref 2.9–4.4)
Albumin/Glob SerPl: 1.3 (ref 0.7–1.7)
Alpha 1: 0.3 g/dL (ref 0.0–0.4)
Alpha2 Glob SerPl Elph-Mcnc: 0.7 g/dL (ref 0.4–1.0)
B-Globulin SerPl Elph-Mcnc: 0.7 g/dL (ref 0.7–1.3)
Gamma Glob SerPl Elph-Mcnc: 1 g/dL (ref 0.4–1.8)
Globulin, Total: 2.7 g/dL (ref 2.2–3.9)
IgA: 145 mg/dL (ref 61–437)
IgG (Immunoglobin G), Serum: 1002 mg/dL (ref 603–1613)
IgM (Immunoglobulin M), Srm: 86 mg/dL (ref 15–143)
Total Protein ELP: 6.1 g/dL (ref 6.0–8.5)

## 2021-09-20 ENCOUNTER — Other Ambulatory Visit: Payer: Self-pay | Admitting: Family

## 2021-09-27 ENCOUNTER — Other Ambulatory Visit: Payer: Self-pay | Admitting: Oncology

## 2021-10-05 ENCOUNTER — Ambulatory Visit: Payer: Medicare Other | Attending: Family | Admitting: Family

## 2021-10-05 ENCOUNTER — Encounter: Payer: Self-pay | Admitting: Family

## 2021-10-05 VITALS — BP 153/64 | HR 65 | Resp 16 | Ht 68.0 in | Wt 118.0 lb

## 2021-10-05 DIAGNOSIS — I739 Peripheral vascular disease, unspecified: Secondary | ICD-10-CM | POA: Diagnosis not present

## 2021-10-05 DIAGNOSIS — F17210 Nicotine dependence, cigarettes, uncomplicated: Secondary | ICD-10-CM | POA: Insufficient documentation

## 2021-10-05 DIAGNOSIS — I252 Old myocardial infarction: Secondary | ICD-10-CM | POA: Insufficient documentation

## 2021-10-05 DIAGNOSIS — Z7984 Long term (current) use of oral hypoglycemic drugs: Secondary | ICD-10-CM | POA: Diagnosis not present

## 2021-10-05 DIAGNOSIS — J449 Chronic obstructive pulmonary disease, unspecified: Secondary | ICD-10-CM

## 2021-10-05 DIAGNOSIS — Z79899 Other long term (current) drug therapy: Secondary | ICD-10-CM | POA: Diagnosis not present

## 2021-10-05 DIAGNOSIS — F32A Depression, unspecified: Secondary | ICD-10-CM | POA: Insufficient documentation

## 2021-10-05 DIAGNOSIS — Z720 Tobacco use: Secondary | ICD-10-CM | POA: Diagnosis not present

## 2021-10-05 DIAGNOSIS — F418 Other specified anxiety disorders: Secondary | ICD-10-CM

## 2021-10-05 DIAGNOSIS — D649 Anemia, unspecified: Secondary | ICD-10-CM | POA: Insufficient documentation

## 2021-10-05 DIAGNOSIS — I110 Hypertensive heart disease with heart failure: Secondary | ICD-10-CM | POA: Diagnosis not present

## 2021-10-05 DIAGNOSIS — I2510 Atherosclerotic heart disease of native coronary artery without angina pectoris: Secondary | ICD-10-CM | POA: Diagnosis not present

## 2021-10-05 DIAGNOSIS — I5022 Chronic systolic (congestive) heart failure: Secondary | ICD-10-CM

## 2021-10-05 DIAGNOSIS — F419 Anxiety disorder, unspecified: Secondary | ICD-10-CM | POA: Diagnosis not present

## 2021-10-05 DIAGNOSIS — I10 Essential (primary) hypertension: Secondary | ICD-10-CM

## 2021-10-05 NOTE — Progress Notes (Signed)
Patient ID: Todd Whitehead, male    DOB: 01/29/1946, 76 y.o.   MRN: [id number]   Mr Dirico is a 76 y/o male with a history of HTN, anxiety, COPD, CAD (MI), PVD, current tobacco use and chronic heart failure.   Echo report from 01/27/21 Left ventricular ejection fraction, by estimation, is 45 to 50%. The left ventricle has mildly decreased function. The left ventricle demonstrates global hypokinesis. There is mild left ventricular hypertrophy of the basal-septal segment. Left ventricular diastolic parameters are consistent with Grade I diastolic dysfunction  LHC done 08/19/15 and showed: Mid LAD lesion, 80% stenosed. Mid Cx lesion, 85% stenosed. Mid RCA lesion, 100% stenosed. LM lesion, 50% stenosed. 3 vessel cad. Mildly reduced lv funciton  Was in the ED 08/03/21 due to COPD exacerbation where he was treated and released.     He presents today for follow up visit with a chief complaint of moderate fatigue with minimal exertion. Describes this as chronic in nature. He has associated cough, shortness of breath, anxiety and worsening depression along with this. He denies any dizziness, abdominal distention, palpitations, pedal edema, chest pain, wheezing or weight gain.   Has oxygen at home but says that he typically doesn't wear it except at bedtime.    Past Medical History:  Diagnosis Date   Anxiety    Arthritis    CHF (congestive heart failure) (HCC)    Colon polyp    COPD (chronic obstructive pulmonary disease) ([place])    smoker   Dysrhythmia    Glaucoma    Heart murmur    Hypertension    on  no meds   Low serum vitamin B12 11/18/2020   Melanoma ([place])    Resected from Left upper arm.    Myocardial infarction [facility]) 2017   Peripheral vascular disease [facility])    Prostate enlargement    Past Surgical History:  Procedure Laterality Date   BACK SURGERY  01/19/98   CARDIAC CATHETERIZATION N/A 08/19/2015   Procedure: Left Heart Cath and Coronary Angiography;  Surgeon: [name], MD;   Location: [place] [facility];  Service: Cardiovascular;  Laterality: N/A;   CHOLECYSTECTOMY N/A 08/19/2016   Procedure: LAPAROSCOPIC CHOLECYSTECTOMY WITH INTRAOPERATIVE CHOLANGIOGRAM;  Surgeon: [name], MD;  Location: [place];  Service: General;  Laterality: N/A;   COLON SURGERY  age 25 days old   " bowel was twisted up"   COLONOSCOPY WITH PROPOFOL N/A 03/19/2016   Procedure: COLONOSCOPY WITH PROPOFOL;  Surgeon: [name], MD;  Location: [facility];  Service: Endoscopy;  Laterality: N/A;   CORONARY ARTERY BYPASS GRAFT  2017   ERCP N/A 08/18/2016   Procedure: ENDOSCOPIC RETROGRADE CHOLANGIOPANCREATOGRAPHY (ERCP);  Surgeon: [name], MD;  Location: [place];  Service: Gastroenterology;  Laterality: N/A;   ESOPHAGOGASTRODUODENOSCOPY (EGD) WITH PROPOFOL N/A 09/18/2018   Procedure: ESOPHAGOGASTRODUODENOSCOPY (EGD) WITH PROPOFOL;  Surgeon: [name], DO;  Location: [place];  Service: General;  Laterality: N/A;   EYE SURGERY Left 03/26/11   LOWER EXTREMITY ANGIOGRAPHY Left 10/03/2017   Procedure: LOWER EXTREMITY ANGIOGRAPHY;  Surgeon: [name], MD;  Location: [place] [facility];  Service: Cardiovascular;  Laterality: Left;   LOWER EXTREMITY ANGIOGRAPHY Right 10/31/2017   Procedure: LOWER EXTREMITY ANGIOGRAPHY;  Surgeon: [name], MD;  Location: [place] [facility];  Service: Cardiovascular;  Laterality: Right;   LUMBAR LAMINECTOMY/DECOMPRESSION MICRODISCECTOMY Right 10/02/2012   Procedure: Right Lumbar Five-Sacral One Lumbar laminotomy/Microdiskectomy;  Surgeon: [name], MD;  Location: [facility];  Service: Neurosurgery;  Laterality: Right;  Right Lumbar Five-Sacral One Lumbar laminotomy/Microdiskectomy   VISCERAL ANGIOGRAPHY N/A 11/08/2020   Procedure: VISCERAL ANGIOGRAPHY;  Surgeon: Katha Cabal, MD;  Location: [place] [facility];  Service: Cardiovascular;  Laterality: N/A;   VISCERAL ANGIOGRAPHY N/A 11/25/2020   Procedure: VISCERAL ANGIOGRAPHY;   Surgeon: Katha Cabal, MD;  Location: [place] [facility];  Service: Cardiovascular;  Laterality: N/A;   Family History  Problem Relation Age of Onset   Heart attack Father    Aneurysm Brother    Lung cancer Brother    Social History   Tobacco Use   Smoking status: Every Day    Packs/day: 1.50    Years: 58.00    Total pack years: 87.00    Types: Cigarettes   Smokeless tobacco: Never  Substance Use Topics   Alcohol use: No   Allergies  Allergen Reactions   Other Nausea And Vomiting    Cheese,butter,sour cream,cottage cheese   Poison Ivy Extract Rash   Poison Oak Extract Rash   Prior to Admission medications   Medication Sig Start Date End Date Taking? Authorizing Provider  acetaminophen (TYLENOL) 500 MG tablet Take 1 tablet (500 mg total) by mouth every 6 (six) hours as needed. Patient taking differently: Take 500 mg by mouth every 6 (six) hours as needed for moderate pain or headache. 08/20/16  Yes Waldron Session, MD  albuterol (PROVENTIL) (2.5 MG/3ML) 0.083% nebulizer solution Take 3 mLs (2.5 mg total) by nebulization every 6 (six) hours as needed for wheezing or shortness of breath. 01/30/21  Yes Fritzi Mandes, MD  ALPRAZolam Duanne Moron) 0.5 MG tablet Take 0.5 mg by mouth at bedtime. 08/17/21  Yes [provider]  aspirin EC 81 MG tablet Take 81 mg by mouth daily. Swallow whole.   Yes [provider]  atorvastatin (LIPITOR) 40 MG tablet Take 40 mg by mouth daily.  05/22/17  Yes [provider]  brimonidine (ALPHAGAN) 0.2 % ophthalmic solution Place 1 drop into both eyes 2 (two) times daily. 02/14/16  Yes [provider]  busPIRone (BUSPAR) 30 MG tablet Take 30 mg by mouth 2 (two) times daily.   Yes [provider]  citalopram (CELEXA) 20 MG tablet Take 20 mg by mouth daily. 10/28/19  Yes [provider]  clopidogrel (PLAVIX) 75 MG tablet TAKE 1 TABLET(75 MG) BY MOUTH DAILY 06/05/21  Yes Schnier, Dolores Lory, MD  dapagliflozin  propanediol (FARXIGA) 10 MG TABS tablet Take 1 tablet (10 mg total) by mouth daily before breakfast. 05/23/21  Yes Darylene Price A, FNP  ferrous sulfate 325 (65 FE) MG EC tablet Take 1 tablet (325 mg total) by mouth 2 (two) times daily with a meal. 06/12/21  Yes Earlie Server, MD  latanoprost (XALATAN) 0.005 % ophthalmic solution Place 1 drop into both eyes at bedtime.  06/14/17  Yes [provider]  metoprolol succinate (TOPROL-XL) 25 MG 24 hr tablet Take 12.5 mg by mouth daily.   Yes [provider]  Multiple Vitamin (MULTIVITAMIN WITH MINERALS) TABS tablet Take 1 tablet by mouth daily. 01/31/21  Yes Fritzi Mandes, MD  Multiple Vitamins-Minerals (MULTIVITAMIN WITH MINERALS) tablet Take 1 tablet by mouth daily.   Yes [provider]  QUEtiapine (SEROQUEL) 25 MG tablet Take 1 tablet (25 mg total) by mouth at bedtime. Patient taking differently: Take 25 mg by mouth 2 (two) times daily. 01/30/21  Yes Fritzi Mandes, MD  sacubitril-valsartan (ENTRESTO) 49-51 MG Take 1 tablet by mouth 2 (two) times  daily. 05/23/21  Yes Alisa Graff, FNP  tamsulosin (FLOMAX) 0.4 MG CAPS capsule Take 1 capsule (0.4 mg total) by mouth daily after breakfast. 08/20/16  Yes Waldron Session, MD  traMADol (ULTRAM) 50 MG tablet Take 1 tablet (50 mg total) by mouth every 12 (twelve) hours as needed. 12/12/20  Yes Richarda Osmond, MD  traZODone (DESYREL) 50 MG tablet Take 50 mg by mouth at bedtime. 06/02/19  Yes [provider]  budesonide-formoterol (SYMBICORT) 80-4.5 MCG/ACT inhaler Inhale 2 puffs into the lungs 2 (two) times daily. Patient not taking: Reported on 08/16/2021 12/12/20   Richarda Osmond, MD    Review of Systems  Constitutional:  Positive for fatigue (improving). Negative for appetite change.  HENT:  Negative for congestion, postnasal drip and sore throat.   Eyes: Negative.   Respiratory:  Positive for cough and shortness of breath (with little exertion). Negative for chest tightness and  wheezing.   Cardiovascular:  Negative for chest pain, palpitations and leg swelling.  Gastrointestinal:  Negative for abdominal distention and abdominal pain.  Endocrine: Negative.   Genitourinary: Negative.   Musculoskeletal:  Negative for back pain and neck pain.  Skin: Negative.   Allergic/Immunologic: Negative.   Neurological:  Negative for dizziness and light-headedness.  Hematological:  Negative for adenopathy. Does not bruise/bleed easily.  Psychiatric/Behavioral:  Positive for dysphoric mood. Negative for sleep disturbance (sleeping on 1 pillow). The patient is nervous/anxious.    Vitals:   10/05/21 0927  BP: (!) 153/64  Pulse: 65  Resp: 16  SpO2: 98%  Weight: 118 lb (53.5 kg)  Height: '5\' 8"'$  (1.727 m)   Wt Readings from Last 3 Encounters:  10/05/21 118 lb (53.5 kg)  08/16/21 113 lb (51.3 kg)  08/03/21 110 lb (49.9 kg)   Lab Results  Component Value Date   CREATININE 1.31 (H) 08/16/2021   CREATININE 1.15 08/03/2021   CREATININE 1.38 (H) 05/23/2021   Physical Exam Vitals and nursing note reviewed.  Constitutional:      General: He is not in acute distress.    Appearance: Normal appearance. He is ill-appearing. He is not toxic-appearing.  HENT:     Head: Normocephalic and atraumatic.  Cardiovascular:     Rate and Rhythm: Normal rate and regular rhythm.     Pulses: Normal pulses.     Heart sounds: No murmur heard. Pulmonary:     Effort: Pulmonary effort is normal. No tachypnea.     Breath sounds: No wheezing, rhonchi or rales.  Abdominal:     General: Abdomen is flat.     Palpations: Abdomen is soft.     Tenderness: There is no abdominal tenderness.  Musculoskeletal:        General: No tenderness.     Cervical back: Normal range of motion and neck supple.     Right lower leg: No tenderness. No edema.     Left lower leg: No tenderness. No edema.  Skin:    General: Skin is warm and dry.     Nails: There is clubbing.  Neurological:     General: No focal  deficit present.     Mental Status: He is alert and oriented to person, place, and time.  Psychiatric:        Mood and Affect: Mood is anxious.        Behavior: Behavior normal.        Thought Content: Thought content normal.     Assessment & Plan:  1: Chronic heart failure with  mildly reduced ejection fraction with structural changes (LVH)- - NYHA class III - euvolemic today - weighing daily; reminded to call for an overnight weight gain of > 2 pounds or a weekly weight gain of > 5 pounds - weight stable from last visit here 3 months ago - not adding salt but does like saltier foods; likes to eat bacon every morning for breakfast, reviewed making lower sodium choices - saw cardiology Corky Sox) on 06/29/21; will now be seeing Dr. Clayborn Bigness - on GDMT of metoprolol, entresto & farxiga - getting entresto and farxiga through patient assistance - BNP 12/09/20 was 1527.4  2: HTN- - BP elevated (153/64) - saw PCP Ouida Sills) 08/17/21 - BMP 08/16/21 reviewed and showed sodium 129, potassium 4.3, creatinine 1.31 & GFR 56  3: severe COPD- - has weaned himself off of oxygen although he does wear it at bedtime - saw pulmonology Raul Del) 05/03/21  4: Tobacco use- - smoking 1.0 ppd of cigarettes; at his peak he was smoking 3 ppd - emphasized to remove himself from the oxygen tank when smoking - has been smoking since the age of 63  5: Anemia - saw hematology Tasia Catchings) 05/17/21 - hemoglobin 08/16/21 was 10.7  6: Depression with anxiety- - he does endorse worsening depression - doesn't feel like his current medication regimen is working - emphasized that he call his PCP to discuss this further - emotional support gi.my?   Patient did not bring his medications nor a list. Each medication was verbally reviewed with the patient and he was encouraged to bring the bottles to every visit to confirm accuracy of list.   Return in 6 months, sooner if needed.

## 2021-10-05 NOTE — Patient Instructions (Signed)
Continue weighing daily and call for an overnight weight gain of 3 pounds or more or a weekly weight gain of more than 5 pounds  If you have voicemail, please make sure your mailbox is cleaned out so that we may leave a message and please make sure to listen to any voicemails.    If you receive a satisfaction survey regarding the [facility], please take the time to fill it out. This way we can continue to provide excellent care and make any changes that need to be made.     

## 2021-10-09 ENCOUNTER — Other Ambulatory Visit: Payer: Self-pay | Admitting: *Deleted

## 2021-10-09 MED ORDER — FERROUS SULFATE 325 (65 FE) MG PO TBEC: 325.0000 mg | DELAYED_RELEASE_TABLET | Freq: Two times a day (BID) | ORAL | 1 refills | Status: DC

## 2021-11-07 ENCOUNTER — Other Ambulatory Visit (INDEPENDENT_AMBULATORY_CARE_PROVIDER_SITE_OTHER): Payer: Self-pay | Admitting: Vascular Surgery

## 2021-11-13 ENCOUNTER — Other Ambulatory Visit: Payer: Self-pay | Admitting: Family

## 2021-11-13 ENCOUNTER — Telehealth: Payer: Self-pay | Admitting: Family

## 2021-11-13 MED ORDER — DAPAGLIFLOZIN PROPANEDIOL 10 MG PO TABS: 10.0000 mg | ORAL_TABLET | Freq: Every day | ORAL | 3 refills | Status: AC

## 2021-11-13 NOTE — Telephone Encounter (Signed)
Sent in new perscription for farxiga and request form on 11/13/21 to [facility] for patients patient assistance program after they sent a fax requesting a refill perscription.

## 2022-01-01 ENCOUNTER — Other Ambulatory Visit: Payer: Self-pay | Admitting: Family

## 2022-01-01 ENCOUNTER — Telehealth: Payer: Self-pay | Admitting: Family

## 2022-01-01 MED ORDER — SACUBITRIL-VALSARTAN 49-51 MG PO TABS: 1.0000 | ORAL_TABLET | Freq: Two times a day (BID) | ORAL | 3 refills | Status: DC

## 2022-01-01 NOTE — Progress Notes (Signed)
Printed entresto [facility] for patient assistance

## 2022-01-01 NOTE — Telephone Encounter (Signed)
Notified patient that his re enrollment novartis application for Delene Loll has been faxed as of 12/4.   Miyanna Wiersma, NT

## 2022-01-03 ENCOUNTER — Encounter: Payer: Self-pay | Admitting: Oncology

## 2022-01-05 ENCOUNTER — Ambulatory Visit (LOCAL_COMMUNITY_HEALTH_CENTER): Payer: Medicare Other

## 2022-01-05 DIAGNOSIS — Z23 Encounter for immunization: Secondary | ICD-10-CM | POA: Diagnosis not present

## 2022-01-05 DIAGNOSIS — Z719 Counseling, unspecified: Secondary | ICD-10-CM

## 2022-01-05 NOTE — Progress Notes (Signed)
  Are you feeling sick today? No   Have you ever received a dose of COVID-19 Vaccine? [employer], [place], [place], [place], Other) Yes  If yes, which vaccine and how many doses?   Pfizer, 6   Did you bring the vaccination record card or other documentation?  Yes   Do you have a health condition or are undergoing treatment that makes you moderately or severely immunocompromised? This would include, but not be limited to: cancer, HIV, organ transplant, immunosuppressive therapy/high-dose corticosteroids, or moderate/severe primary immunodeficiency.  No  Have you received COVID-19 vaccine before or during hematopoietic cell transplant (HCT) or CAR-T-cell therapies? No  Have you ever had an allergic reaction to: (This would include a severe allergic reaction or a reaction that caused hives, swelling, or respiratory distress, including wheezing.) A component of a COVID-19 vaccine or a previous dose of COVID-19 vaccine? No   Have you ever had an allergic reaction to another vaccine (other thanCOVID-19 vaccine) or an injectable medication? (This would include a severe allergic reaction or a reaction that caused hives, swelling, or respiratory distress, including wheezing.)   No    Do you have a history of any of the following:  Myocarditis or Pericarditis No  Dermal fillers:  No  Multisystem Inflammatory Syndrome (MIS-C or MIS-A)? No  COVID-19 disease within the past 3 months? No  Vaccinated with monkeypox vaccine in the last 4 weeks? No  Eligible, administered Pfizer comirnaty [phone]. Provide VIS and NCIR copy.  Pt refused post vaccination 15 minutes observation. M.Martine Trageser, LPN.

## 2022-02-06 ENCOUNTER — Inpatient Hospital Stay: Payer: Medicare Other | Attending: Oncology

## 2022-02-06 ENCOUNTER — Other Ambulatory Visit: Payer: Self-pay

## 2022-02-06 DIAGNOSIS — Z85820 Personal history of malignant melanoma of skin: Secondary | ICD-10-CM | POA: Insufficient documentation

## 2022-02-06 DIAGNOSIS — I110 Hypertensive heart disease with heart failure: Secondary | ICD-10-CM | POA: Diagnosis not present

## 2022-02-06 DIAGNOSIS — D472 Monoclonal gammopathy: Secondary | ICD-10-CM

## 2022-02-06 DIAGNOSIS — Z801 Family history of malignant neoplasm of trachea, bronchus and lung: Secondary | ICD-10-CM | POA: Diagnosis not present

## 2022-02-06 DIAGNOSIS — D649 Anemia, unspecified: Secondary | ICD-10-CM | POA: Diagnosis not present

## 2022-02-06 DIAGNOSIS — I5023 Acute on chronic systolic (congestive) heart failure: Secondary | ICD-10-CM | POA: Diagnosis not present

## 2022-02-06 DIAGNOSIS — R634 Abnormal weight loss: Secondary | ICD-10-CM | POA: Insufficient documentation

## 2022-02-06 DIAGNOSIS — F17210 Nicotine dependence, cigarettes, uncomplicated: Secondary | ICD-10-CM | POA: Diagnosis not present

## 2022-02-06 DIAGNOSIS — Z9049 Acquired absence of other specified parts of digestive tract: Secondary | ICD-10-CM | POA: Diagnosis not present

## 2022-02-06 LAB — CBC WITH DIFFERENTIAL/PLATELET
Abs Immature Granulocytes: 0.02 10*3/uL (ref 0.00–0.07)
Basophils Absolute: 0 10*3/uL (ref 0.0–0.1)
Basophils Relative: 1 %
Eosinophils Absolute: 0.2 10*3/uL (ref 0.0–0.5)
Eosinophils Relative: 3 %
HCT: 34.4 % — ABNORMAL LOW (ref 39.0–52.0)
Hemoglobin: 11.9 g/dL — ABNORMAL LOW (ref 13.0–17.0)
Immature Granulocytes: 0 %
Lymphocytes Relative: 21 %
Lymphs Abs: 1.3 10*3/uL (ref 0.7–4.0)
MCH: 31.1 pg (ref 26.0–34.0)
MCHC: 34.6 g/dL (ref 30.0–36.0)
MCV: 89.8 fL (ref 80.0–100.0)
Monocytes Absolute: 0.6 10*3/uL (ref 0.1–1.0)
Monocytes Relative: 9 %
Neutro Abs: 3.9 10*3/uL (ref 1.7–7.7)
Neutrophils Relative %: 66 %
Platelets: 186 10*3/uL (ref 150–400)
RBC: 3.83 MIL/uL — ABNORMAL LOW (ref 4.22–5.81)
RDW: 14.4 % (ref 11.5–15.5)
WBC: 6 10*3/uL (ref 4.0–10.5)
nRBC: 0 % (ref 0.0–0.2)

## 2022-02-06 LAB — COMPREHENSIVE METABOLIC PANEL
ALT: 20 U/L (ref 0–44)
AST: 27 U/L (ref 15–41)
Albumin: 4.2 g/dL (ref 3.5–5.0)
Alkaline Phosphatase: 80 U/L (ref 38–126)
Anion gap: 8 (ref 5–15)
BUN: 23 mg/dL (ref 8–23)
CO2: 25 mmol/L (ref 22–32)
Calcium: 9.1 mg/dL (ref 8.9–10.3)
Chloride: 104 mmol/L (ref 98–111)
Creatinine, Ser: 1.4 mg/dL — ABNORMAL HIGH (ref 0.61–1.24)
GFR, Estimated: 52 mL/min — ABNORMAL LOW (ref >60)
Glucose, Bld: 105 mg/dL — ABNORMAL HIGH (ref 70–99)
Potassium: 4 mmol/L (ref 3.5–5.1)
Sodium: 137 mmol/L (ref 135–145)
Total Bilirubin: 0.5 mg/dL (ref 0.3–1.2)
Total Protein: 6.9 g/dL (ref 6.5–8.1)

## 2022-02-07 LAB — KAPPA/LAMBDA LIGHT CHAINS
Kappa free light chain: 39 mg/L — ABNORMAL HIGH (ref 3.3–19.4)
Kappa, lambda light chain ratio: 1.33 (ref 0.26–1.65)
Lambda free light chains: 29.4 mg/L — ABNORMAL HIGH (ref 5.7–26.3)

## 2022-02-09 LAB — MULTIPLE MYELOMA PANEL, SERUM
Albumin SerPl Elph-Mcnc: 4.1 g/dL (ref 2.9–4.4)
Albumin/Glob SerPl: 1.7 (ref 0.7–1.7)
Alpha 1: 0.2 g/dL (ref 0.0–0.4)
Alpha2 Glob SerPl Elph-Mcnc: 0.6 g/dL (ref 0.4–1.0)
B-Globulin SerPl Elph-Mcnc: 0.7 g/dL (ref 0.7–1.3)
Gamma Glob SerPl Elph-Mcnc: 0.9 g/dL (ref 0.4–1.8)
Globulin, Total: 2.5 g/dL (ref 2.2–3.9)
IgA: 150 mg/dL (ref 61–437)
IgG (Immunoglobin G), Serum: 1042 mg/dL (ref 603–1613)
IgM (Immunoglobulin M), Srm: 75 mg/dL (ref 15–143)
M Protein SerPl Elph-Mcnc: 0.2 g/dL — ABNORMAL HIGH
Total Protein ELP: 6.6 g/dL (ref 6.0–8.5)

## 2022-02-16 ENCOUNTER — Encounter: Payer: Self-pay | Admitting: Oncology

## 2022-02-16 ENCOUNTER — Inpatient Hospital Stay: Payer: Medicare Other | Admitting: Oncology

## 2022-02-16 VITALS — BP 143/68 | HR 65 | Temp 97.3°F | Resp 18 | Wt 118.5 lb

## 2022-02-16 DIAGNOSIS — D649 Anemia, unspecified: Secondary | ICD-10-CM

## 2022-02-16 DIAGNOSIS — D472 Monoclonal gammopathy: Secondary | ICD-10-CM | POA: Diagnosis not present

## 2022-02-16 NOTE — Assessment & Plan Note (Signed)
Hemoglobin has improved. monitor

## 2022-02-16 NOTE — Progress Notes (Signed)
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 Stable M protein and light chain ratio.  Recommend observation, repeat labs in 6 months.   Anemia Hemoglobin has improved. monitor   Orders Placed This Encounter  Procedures   CBC with Differential/Platelet    Standing Status:   Future    Standing Expiration Date:   02/16/2023   Comprehensive metabolic panel    Standing Status:   Future    Standing Expiration Date:   02/16/2023   Multiple Myeloma Panel (SPEP&IFE w/QIG)    Standing Status:   Future    Standing Expiration Date:   02/16/2023   Kappa/lambda light chains    Standing Status:   Future    Standing Expiration Date:   02/16/2023   Follow up in 6 months.  All questions were answered. The patient knows to call the clinic with any problems, questions or concerns.  Earlie Server, MD, PhD [facility] Hematology Oncology 02/16/2022   HISTORY OF PRESENTING ILLNESS:   Todd Whitehead is a  77 y.o.  male presents for follow up  Patient was accompanied by her friend Ms. Williams. 8/31 2022, CBC showed a hemoglobin of 10.3, hematocrit 29.5, WBC 5.7  10/20/2020, iron panel showed decreased iron saturation of 12, ferritin 140, TIBC 265.  Patient reports feeling fatigued and tired.  Denies any hematemesis or hematochezia. Patient has chronic abdominal pain, and only is able to eat 1 meal per day.  Patient has significant weight loss as well as some nausea.  07/06/2020 CT abdomen pelvis showed no acute findings.  No explanation of patient's unintended weight loss.  Mucous plugging seen within the right lower lobe bronchi with minimal right base atelectasis or early infiltrate. Patient follows up with gastroenterology Dr.Tahiliani, has also been seen  by surgery to [facility].  Surgical intervention was not recommended.   09/13/2020, CT abdomen pelvis with contrast at [facility] showed extensive atherosclerotic disease.  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  Previous cholecystectomy.  Prominence, bile duct.  Cyst of both kidneys.  Negative for hydronephrosis.  Moderate distention urinary bladder.  Questionable bladder outlet obstruction.  Prostate gland is mildly enlarged and heterogeneous. Patient was last seen by Dr. Bonna Gains on 10/20/2020.  GI recommends EGD and colonoscopy for further evaluation of his symptoms. Patient was seen by vascular surgery Dr.Schnier on 10/24/2020.  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 11/08/2020, aortogram showed a diffusely diseased no hemodynamically significant lesions identified.  SMA was widely patent.  Celiac demonstrates greater than 90% stenosis at its origin but distal to the stenosis appears to be widely patent.  Patient will be rescheduled with plan of upper extremity access which will allow leverage enabling treatment of the lesion and revascularization and stent placement.  Patient was referred to establish care with hematology for evaluation of anemia.  01/26/2021 - 01/30/2021, patient was hospitalized due to hemoptysis, acute on chronic systolic CHF, community-acquired pneumonia, acute hypoxic respiratory failure.  2D echo showed EF 77-41%, grade 1 diastolic dysfunction.  Patient follows up with pulmonology for a suspicious nodule in his lung.  04/27/2021, CT chest without contrast showed interval resolution of the previously seen spiculated nodule of the dependent right lung base.  As well as bilateral airspace disease.  Consistent with resolution of infection or inflammation.  Severe  emphysema, diffuse bronchial wall thickening.  CAD.  # History of melanoma  INTERVAL HISTORY Todd Whitehead is a 77 y.o. male who has above history reviewed by me today presents for follow up of MGUS,  anemia Diarrhea has resolved, appetite is good. He has gained weight.  No fever, chills, nausea, vomiting.  He has decreased alcohol drinking, only socially.   Review of Systems  Constitutional:  Positive for fatigue. Negative for appetite change, chills, diaphoresis, fever and unexpected weight change.  HENT:   Negative for hearing loss, lump/mass, nosebleeds and sore throat.   Eyes:  Negative for eye problems and icterus.  Respiratory:  Negative for chest tightness, cough, hemoptysis, shortness of breath and wheezing.   Cardiovascular:  Negative for chest pain and leg swelling.  Gastrointestinal:  Negative for abdominal distention, abdominal pain, blood in stool, diarrhea, nausea and rectal pain.  Endocrine: Negative for hot flashes.  Genitourinary:  Negative for bladder incontinence, difficulty urinating, dysuria, frequency, hematuria and nocturia.   Musculoskeletal:  Negative for back pain, flank pain, gait problem and myalgias.  Skin:  Negative for rash.  Neurological:  Negative for dizziness, gait problem, headaches, numbness and seizures.  Hematological:  Negative for adenopathy. Does not bruise/bleed easily.  Psychiatric/Behavioral:  Negative for confusion and decreased concentration. The patient is not nervous/anxious.     MEDICAL HISTORY:  Past Medical History:  Diagnosis Date   Anxiety    Arthritis    CHF (congestive heart failure) (HCC)    Colon polyp    COPD (chronic obstructive pulmonary disease) ([place])    smoker   Dysrhythmia    Glaucoma    Heart murmur    Hypertension    on  no meds   Low serum vitamin B12 11/18/2020   Melanoma ([place])    Resected from Left upper arm.    Myocardial infarction [facility]) 2017   Peripheral vascular disease [facility])    Prostate enlargement     SURGICAL HISTORY: Past Surgical History:  Procedure Laterality Date   BACK SURGERY  01/19/98   CARDIAC CATHETERIZATION N/A 08/19/2015   Procedure: Left Heart Cath and Coronary Angiography;   Surgeon: [name], MD;  Location: [place] CV LAB;  Service: Cardiovascular;  Laterality: N/A;   CHOLECYSTECTOMY N/A 08/19/2016   Procedure: LAPAROSCOPIC CHOLECYSTECTOMY WITH INTRAOPERATIVE CHOLANGIOGRAM;  Surgeon: [name], MD;  Location: [place];  Service: General;  Laterality: N/A;   COLON SURGERY  age 20 days old   " bowel was twisted up"   COLONOSCOPY WITH PROPOFOL N/A 03/19/2016   Procedure: COLONOSCOPY WITH PROPOFOL;  Surgeon: [name], MD;  Location: [facility] ENDOSCOPY;  Service: Endoscopy;  Laterality: N/A;   CORONARY ARTERY BYPASS GRAFT  2017   ERCP N/A 08/18/2016   Procedure: ENDOSCOPIC RETROGRADE CHOLANGIOPANCREATOGRAPHY (ERCP);  Surgeon: [name], MD;  Location: [place];  Service: Gastroenterology;  Laterality: N/A;   ESOPHAGOGASTRODUODENOSCOPY (EGD) WITH PROPOFOL N/A 09/18/2018   Procedure: ESOPHAGOGASTRODUODENOSCOPY (EGD) WITH PROPOFOL;  Surgeon: [name], DO;  Location: [place];  Service: General;  Laterality: N/A;   EYE SURGERY Left 03/26/11   LOWER EXTREMITY ANGIOGRAPHY Left 10/03/2017   Procedure: LOWER EXTREMITY ANGIOGRAPHY;  Surgeon: [name], MD;  Location: [place] CV LAB;  Service: Cardiovascular;  Laterality: Left;   LOWER EXTREMITY ANGIOGRAPHY Right 10/31/2017   Procedure: LOWER EXTREMITY ANGIOGRAPHY;  Surgeon: [name], MD;  Location: [place] CV LAB;  Service: Cardiovascular;  Laterality: Right;   LUMBAR LAMINECTOMY/DECOMPRESSION MICRODISCECTOMY Right 10/02/2012  Procedure: Right Lumbar Five-Sacral One Lumbar laminotomy/Microdiskectomy;  Surgeon: [name], MD;  Location: [place] NEURO ORS;  Service: Neurosurgery;  Laterality: Right;  Right Lumbar Five-Sacral One Lumbar laminotomy/Microdiskectomy   VISCERAL ANGIOGRAPHY N/A 11/08/2020   Procedure: VISCERAL ANGIOGRAPHY;  Surgeon: Katha Cabal, MD;  Location: [place] CV LAB;  Service: Cardiovascular;  Laterality: N/A;   VISCERAL ANGIOGRAPHY N/A 11/25/2020    Procedure: VISCERAL ANGIOGRAPHY;  Surgeon: Katha Cabal, MD;  Location: [place] CV LAB;  Service: Cardiovascular;  Laterality: N/A;    SOCIAL HISTORY: Social History   Socioeconomic History   Marital status: Divorced    Spouse name: Not on file   Number of children: Not on file   Years of education: Not on file   Highest education level: Not on file  Occupational History   Not on file  Tobacco Use   Smoking status: Every Day    Packs/day: 1.50    Years: 58.00    Total pack years: 87.00    Types: Cigarettes   Smokeless tobacco: Never  Vaping Use   Vaping Use: Former  Substance and Sexual Activity   Alcohol use: No   Drug use: No   Sexual activity: Not Currently  Other Topics Concern   Not on file  Social History Narrative   Not on file   Social Determinants of Health   Financial Resource Strain: Not on file  Food Insecurity: Not on file  Transportation Needs: Not on file  Physical Activity: Not on file  Stress: Not on file  Social Connections: Not on file  Intimate Partner Violence: Not on file    FAMILY HISTORY: Family History  Problem Relation Age of Onset   Heart attack Father    Aneurysm Brother    Lung cancer Brother     ALLERGIES:  is allergic to other, poison ivy extract, and poison oak extract.  MEDICATIONS:  Current Outpatient Medications  Medication Sig Dispense Refill   ALPRAZolam (XANAX) 0.5 MG tablet Take 0.5 mg by mouth at bedtime.     aspirin EC 81 MG tablet Take 81 mg by mouth daily. Swallow whole.     atorvastatin (LIPITOR) 40 MG tablet Take 40 mg by mouth daily.      brimonidine (ALPHAGAN) 0.2 % ophthalmic solution Place 1 drop into both eyes 2 (two) times daily.     busPIRone (BUSPAR) 30 MG tablet Take 30 mg by mouth 2 (two) times daily.     citalopram (CELEXA) 20 MG tablet Take 20 mg by mouth daily.     clopidogrel (PLAVIX) 75 MG tablet TAKE 1 TABLET(75 MG) BY MOUTH DAILY 30 tablet 5   dapagliflozin propanediol (FARXIGA) 10  MG TABS tablet Take 1 tablet (10 mg total) by mouth daily before breakfast. 90 tablet 3   ferrous sulfate 325 (65 FE) MG EC tablet Take 1 tablet (325 mg total) by mouth 2 (two) times daily with a meal. 180 tablet 1   latanoprost (XALATAN) 0.005 % ophthalmic solution Place 1 drop into both eyes at bedtime.   2   metoprolol succinate (TOPROL-XL) 25 MG 24 hr tablet Take 12.5 mg by mouth daily.     Multiple Vitamin (MULTIVITAMIN WITH MINERALS) TABS tablet Take 1 tablet by mouth daily. 30 tablet 0   QUEtiapine (SEROQUEL) 25 MG tablet Take 1 tablet (25 mg total) by mouth at bedtime. (Patient taking differently: Take 25 mg by mouth 2 (two) times daily.) 30 tablet 0   sacubitril-valsartan (ENTRESTO) 49-51 MG  Take 1 tablet by mouth 2 (two) times daily. 180 tablet 3   tamsulosin (FLOMAX) 0.4 MG CAPS capsule Take 1 capsule (0.4 mg total) by mouth daily after breakfast. 30 capsule 2   traMADol (ULTRAM) 50 MG tablet Take 1 tablet (50 mg total) by mouth every 12 (twelve) hours as needed. 30 tablet 2   traZODone (DESYREL) 50 MG tablet Take 50 mg by mouth at bedtime.     acetaminophen (TYLENOL) 500 MG tablet Take 1 tablet (500 mg total) by mouth every 6 (six) hours as needed. (Patient not taking: Reported on 02/16/2022) 30 tablet 0   albuterol (PROVENTIL) (2.5 MG/3ML) 0.083% nebulizer solution Take 3 mLs (2.5 mg total) by nebulization every 6 (six) hours as needed for wheezing or shortness of breath. (Patient not taking: Reported on 02/16/2022) 75 mL 12   No current facility-administered medications for this visit.     PHYSICAL EXAMINATION: ECOG PERFORMANCE STATUS: 3 - Symptomatic, >50% confined to bed Vitals:   02/16/22 1205  BP: (!) 143/68  Pulse: 65  Resp: 18  Temp: (!) 97.3 F (36.3 C)   Filed Weights   02/16/22 1205  Weight: 118 lb 8 oz (53.8 kg)    Physical Exam Constitutional:      General: He is not in acute distress.    Comments: Patient ambulates independently  HENT:     Head:  Normocephalic.  Eyes:     General: No scleral icterus. Cardiovascular:     Rate and Rhythm: Normal rate and regular rhythm.  Pulmonary:     Effort: Pulmonary effort is normal.     Comments: Decreased breath sound bilaterally. Abdominal:     General: Bowel sounds are normal. There is no distension.     Palpations: Abdomen is soft.  Musculoskeletal:        General: No deformity. Normal range of motion.     Cervical back: Normal range of motion.  Skin:    General: Skin is warm and dry.  Neurological:     Mental Status: He is alert and oriented to person, place, and time. Mental status is at baseline.     Cranial Nerves: No cranial nerve deficit.  Psychiatric:        Mood and Affect: Mood normal.     LABORATORY DATA:  I have reviewed the data as listed    Latest Ref Rng & Units 02/06/2022    1:08 PM 08/16/2021   12:56 PM 08/03/2021    6:18 AM  CBC  WBC 4.0 - 10.5 K/uL 6.0  7.7  10.7   Hemoglobin 13.0 - 17.0 g/dL 11.9  10.7  10.3   Hematocrit 39.0 - 52.0 % 34.4  31.3  29.9   Platelets 150 - 400 K/uL 186  324  240       Latest Ref Rng & Units 02/06/2022    1:08 PM 08/16/2021   12:56 PM 08/03/2021    6:18 AM  CMP  Glucose 70 - 99 mg/dL 105  129  129   BUN 8 - 23 mg/dL '23  24  14   '$ Creatinine 0.61 - 1.24 mg/dL 1.40  1.31  1.15   Sodium 135 - 145 mmol/L 137  129  131   Potassium 3.5 - 5.1 mmol/L 4.0  4.3  3.4   Chloride 98 - 111 mmol/L 104  100  103   CO2 22 - 32 mmol/L '25  23  20   '$ Calcium 8.9 - 10.3 mg/dL 9.1  8.7  8.8   Total Protein 6.5 - 8.1 g/dL 6.9  6.8  6.9   Total Bilirubin 0.3 - 1.2 mg/dL 0.5  0.6  0.7   Alkaline Phos 38 - 126 U/L 80  74  72   AST 15 - 41 U/L '27  28  28   '$ ALT 0 - 44 U/L '20  26  22     '$ RADIOGRAPHIC STUDIES: I have personally reviewed the radiological images as listed and agreed with the findings in the report. No results found.

## 2022-02-16 NOTE — Assessment & Plan Note (Signed)
IgG kappa MGUS Labs are reviewed and discussed with patient. Stable M protein and light chain ratio.  Recommend observation, repeat labs in 6 months.

## 2022-03-19 ENCOUNTER — Encounter: Payer: Self-pay | Admitting: Oncology

## 2022-03-19 ENCOUNTER — Other Ambulatory Visit: Payer: Self-pay | Admitting: *Deleted

## 2022-03-19 MED ORDER — SACUBITRIL-VALSARTAN 49-51 MG PO TABS: 1.0000 | ORAL_TABLET | Freq: Two times a day (BID) | ORAL | 3 refills | Status: DC

## 2022-03-20 ENCOUNTER — Other Ambulatory Visit: Payer: Self-pay | Admitting: Family

## 2022-03-20 MED ORDER — SACUBITRIL-VALSARTAN 49-51 MG PO TABS: 1.0000 | ORAL_TABLET | Freq: Two times a day (BID) | ORAL | 3 refills | Status: DC

## 2022-03-22 ENCOUNTER — Encounter (INDEPENDENT_AMBULATORY_CARE_PROVIDER_SITE_OTHER): Payer: Medicare Other

## 2022-03-22 ENCOUNTER — Ambulatory Visit (INDEPENDENT_AMBULATORY_CARE_PROVIDER_SITE_OTHER): Payer: Medicare Other | Admitting: Vascular Surgery

## 2022-03-22 ENCOUNTER — Other Ambulatory Visit (INDEPENDENT_AMBULATORY_CARE_PROVIDER_SITE_OTHER): Payer: Self-pay | Admitting: Vascular Surgery

## 2022-03-22 DIAGNOSIS — I739 Peripheral vascular disease, unspecified: Secondary | ICD-10-CM

## 2022-03-22 DIAGNOSIS — I6523 Occlusion and stenosis of bilateral carotid arteries: Secondary | ICD-10-CM

## 2022-03-22 DIAGNOSIS — K551 Chronic vascular disorders of intestine: Secondary | ICD-10-CM

## 2022-03-25 NOTE — Progress Notes (Unsigned)
MRN : [id number]  Todd Whitehead is a 77 y.o. (Jul 02, 1945) male who presents with chief complaint of check circulation.  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     Procedure 11/25/2020: Stent to the celiac artery origin with 6 mm diameter x 37 mm length balloon expandable lifestream stent postdilated to 7 mm     The patient denies abdominal pain or postprandial symptoms.  The patient denies weight loss as well as nausea.  The patient does not substantiate food fear, particular foods do not seem to aggravate or alleviate the symptoms.  The patient denies bloody bowel movements or diarrhea.  The patient has a history of colonoscopy which was not diagnostic.  No history of peptic ulcer disease.    The patient is also followed for carotid artery stenosis.  The patient denies interval episodes of amaurosis fugax or recent TIA symptoms. There are no recent neurological changes noted.  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 The patient denies worsening of his claudication symptoms or development of rest pain symptoms.  The patient denies history of DVT, PE or superficial thrombophlebitis. The patient denies recent episodes of angina    No outpatient medications have been marked as taking for the 03/26/22 encounter (Appointment) with Delana Meyer, Dolores Lory, MD.    Past Medical History:  Diagnosis Date   Anxiety    Arthritis    CHF (congestive heart failure) ([place])    Colon polyp    COPD (chronic obstructive pulmonary disease) ([place])    smoker   Dysrhythmia    Glaucoma    Heart murmur    Hypertension    on  no meds   Low serum vitamin B12 11/18/2020   Melanoma ([place])    Resected from Left upper arm.    Myocardial infarction [facility]) 2017   Peripheral  vascular disease [facility])    Prostate enlargement     Past Surgical History:  Procedure Laterality Date   BACK SURGERY  01/19/98   CARDIAC CATHETERIZATION N/A 08/19/2015   Procedure: Left Heart Cath and Coronary Angiography;  Surgeon: [name], MD;  Location: [place] CV LAB;  Service: Cardiovascular;  Laterality: N/A;   CHOLECYSTECTOMY N/A 08/19/2016   Procedure: LAPAROSCOPIC CHOLECYSTECTOMY WITH INTRAOPERATIVE CHOLANGIOGRAM;  Surgeon: [name], MD;  Location: [place];  Service: General;  Laterality: N/A;   COLON SURGERY  age 34 days old   " bowel was twisted up"   COLONOSCOPY WITH PROPOFOL N/A 03/19/2016   Procedure: COLONOSCOPY WITH PROPOFOL;  Surgeon: [name], MD;  [facility] ENDOSCOPY;  Service: Endoscopy;  Laterality: N/A;   CORONARY ARTERY BYPASS GRAFT  2017   ERCP N/A 08/18/2016   Procedure: ENDOSCOPIC RETROGRADE CHOLANGIOPANCREATOGRAPHY (ERCP);  Surgeon: [name], MD;  Location: [place];  Service: Gastroenterology;  Laterality: N/A;   ESOPHAGOGASTRODUODENOSCOPY (EGD) WITH PROPOFOL N/A 09/18/2018   Procedure: ESOPHAGOGASTRODUODENOSCOPY (EGD) WITH PROPOFOL;  Surgeon: [name], DO;  Location: [place];  Service: General;  Laterality: N/A;   EYE SURGERY Left 03/26/11   LOWER EXTREMITY ANGIOGRAPHY Left 10/03/2017   Procedure: LOWER EXTREMITY ANGIOGRAPHY;  Surgeon: [name], MD;  Location: [place] CV LAB;  Service: Cardiovascular;  Laterality: Left;   LOWER EXTREMITY ANGIOGRAPHY Right 10/31/2017  Procedure: LOWER EXTREMITY ANGIOGRAPHY;  Surgeon: [name], MD;  Location: [place] CV LAB;  Service: Cardiovascular;  Laterality: Right;   LUMBAR LAMINECTOMY/DECOMPRESSION MICRODISCECTOMY Right 10/02/2012   Procedure: Right Lumbar Five-Sacral One Lumbar laminotomy/Microdiskectomy;  Surgeon: [name], MD;  Location: [place] NEURO ORS;  Service: Neurosurgery;  Laterality: Right;  Right Lumbar Five-Sacral One Lumbar laminotomy/Microdiskectomy    VISCERAL ANGIOGRAPHY N/A 11/08/2020   Procedure: VISCERAL ANGIOGRAPHY;  Surgeon: Katha Cabal, MD;  Location: [place] CV LAB;  Service: Cardiovascular;  Laterality: N/A;   VISCERAL ANGIOGRAPHY N/A 11/25/2020   Procedure: VISCERAL ANGIOGRAPHY;  Surgeon: Katha Cabal, MD;  Location: [place] CV LAB;  Service: Cardiovascular;  Laterality: N/A;    Social History Social History   Tobacco Use   Smoking status: Every Day    Packs/day: 1.50    Years: 58.00    Total pack years: 87.00    Types: Cigarettes   Smokeless tobacco: Never  Vaping Use   Vaping Use: Former  Substance Use Topics   Alcohol use: No   Drug use: No    Family History Family History  Problem Relation Age of Onset   Heart attack Father    Aneurysm Brother    Lung cancer Brother     Allergies  Allergen Reactions   Other Nausea And Vomiting    Cheese,butter,sour cream,cottage cheese   Poison Ivy Extract Rash   Poison Oak Extract Rash     REVIEW OF SYSTEMS (Negative unless checked)  Constitutional: '[]'$ Weight loss  '[]'$ Fever  '[]'$ Chills Cardiac: '[]'$ Chest pain   '[]'$ Chest pressure   '[]'$ Palpitations   '[]'$ Shortness of breath when laying flat   '[]'$ Shortness of breath with exertion. Vascular:  '[x]'$ Pain in legs with walking   '[]'$ Pain in legs at rest  '[]'$ History of DVT   '[]'$ Phlebitis   '[]'$ Swelling in legs   '[]'$ Varicose veins   '[]'$ Non-healing ulcers Pulmonary:   '[]'$ Uses home oxygen   '[]'$ Productive cough   '[]'$ Hemoptysis   '[]'$ Wheeze  '[]'$ COPD   '[]'$ Asthma Neurologic:  '[]'$ Dizziness   '[]'$ Seizures   '[]'$ History of stroke   '[]'$ History of TIA  '[]'$ Aphasia   '[]'$ Vissual changes   '[]'$ Weakness or numbness in arm   '[]'$ Weakness or numbness in leg Musculoskeletal:   '[]'$ Joint swelling   '[]'$ Joint pain   '[]'$ Low back pain Hematologic:  '[]'$ Easy bruising  '[]'$ Easy bleeding   '[]'$ Hypercoagulable state   '[]'$ Anemic Gastrointestinal:  '[]'$ Diarrhea   '[]'$ Vomiting  '[]'$ Gastroesophageal reflux/heartburn   '[]'$ Difficulty swallowing. Genitourinary:  '[]'$ Chronic kidney disease    '[]'$ Difficult urination  '[]'$ Frequent urination   '[]'$ Blood in urine Skin:  '[]'$ Rashes   '[]'$ Ulcers  Psychological:  '[]'$ History of anxiety   '[]'$  History of major depression.  Physical Examination  There were no vitals filed for this visit. There is no height or weight on file to calculate BMI. Gen: WD/WN, NAD Head: [place]/AT, No temporalis wasting.  Ear/Nose/Throat: Hearing grossly intact, nares w/o erythema or drainage Eyes: PER, EOMI, sclera nonicteric.  Neck: Supple, no masses.  No bruit or JVD.  Pulmonary:  Good air movement, no audible wheezing, no use of accessory muscles.  Cardiac: RRR, normal S1, S2, no Murmurs. Vascular:  mild trophic changes, no open wounds Vessel Right Left  Radial Palpable Palpable  PT Not Palpable Not Palpable  DP Not Palpable Not Palpable  Gastrointestinal: soft, non-distended. No guarding/no peritoneal signs.  Musculoskeletal: M/S 5/5 throughout.  No visible deformity.  Neurologic: CN 2-12 intact. Pain and light touch intact in extremities.  Symmetrical.  Speech is fluent.  Motor exam as listed above. Psychiatric: Judgment intact, Mood & affect appropriate for pt's clinical situation. Dermatologic: No rashes or ulcers noted.  No changes consistent with cellulitis.   CBC Lab Results  Component Value Date   WBC 6.0 02/06/2022   HGB 11.9 (L) 02/06/2022   HCT 34.4 (L) 02/06/2022   MCV 89.8 02/06/2022   PLT 186 02/06/2022    BMET    Component Value Date/Time   NA 137 02/06/2022 1308   NA 138 07/06/2020 1505   K 4.0 02/06/2022 1308   CL 104 02/06/2022 1308   CO2 25 02/06/2022 1308   GLUCOSE 105 (H) 02/06/2022 1308   BUN 23 02/06/2022 1308   BUN 18 07/06/2020 1505   CREATININE 1.40 (H) 02/06/2022 1308   CALCIUM 9.1 02/06/2022 1308   GFRNONAA 52 (L) 02/06/2022 1308   GFRAA 78 10/30/2019 1424   CrCl cannot be calculated (Patient's most recent lab result is older than the maximum 21 days allowed.).  COAG Lab Results  Component Value Date   INR 1.1  01/26/2021   INR 1.1 01/26/2021   INR 0.97 11/08/2017    Radiology No results found.   Assessment/Plan There are no diagnoses linked to this encounter.   [name], MD  03/25/2022 10:05 AM

## 2022-03-26 ENCOUNTER — Ambulatory Visit (INDEPENDENT_AMBULATORY_CARE_PROVIDER_SITE_OTHER): Payer: Medicare Other

## 2022-03-26 ENCOUNTER — Ambulatory Visit (INDEPENDENT_AMBULATORY_CARE_PROVIDER_SITE_OTHER): Payer: Medicare Other | Admitting: Vascular Surgery

## 2022-03-26 ENCOUNTER — Encounter (INDEPENDENT_AMBULATORY_CARE_PROVIDER_SITE_OTHER): Payer: Self-pay | Admitting: Vascular Surgery

## 2022-03-26 VITALS — BP 118/68 | HR 80 | Resp 16 | Wt 115.0 lb

## 2022-03-26 DIAGNOSIS — I6523 Occlusion and stenosis of bilateral carotid arteries: Secondary | ICD-10-CM | POA: Diagnosis not present

## 2022-03-26 DIAGNOSIS — I25118 Atherosclerotic heart disease of native coronary artery with other forms of angina pectoris: Secondary | ICD-10-CM | POA: Diagnosis not present

## 2022-03-26 DIAGNOSIS — K551 Chronic vascular disorders of intestine: Secondary | ICD-10-CM

## 2022-03-26 DIAGNOSIS — I739 Peripheral vascular disease, unspecified: Secondary | ICD-10-CM | POA: Diagnosis not present

## 2022-03-26 DIAGNOSIS — I779 Disorder of arteries and arterioles, unspecified: Secondary | ICD-10-CM

## 2022-03-26 DIAGNOSIS — I10 Essential (primary) hypertension: Secondary | ICD-10-CM

## 2022-03-27 ENCOUNTER — Encounter (INDEPENDENT_AMBULATORY_CARE_PROVIDER_SITE_OTHER): Payer: Self-pay | Admitting: Vascular Surgery

## 2022-03-28 LAB — VAS US ABI WITH/WO TBI
Left ABI: 1.04
Right ABI: 1.08

## 2022-04-11 ENCOUNTER — Encounter: Payer: Self-pay | Admitting: Oncology

## 2022-04-16 ENCOUNTER — Other Ambulatory Visit: Payer: Self-pay | Admitting: Oncology

## 2022-04-17 ENCOUNTER — Encounter: Payer: Self-pay | Admitting: Oncology

## 2022-04-18 ENCOUNTER — Ambulatory Visit: Payer: Medicare Other | Admitting: Family

## 2022-04-18 ENCOUNTER — Telehealth: Payer: Self-pay | Admitting: Family

## 2022-04-18 NOTE — Telephone Encounter (Signed)
Patient did not show for his Heart Failure Clinic appointment on 04/18/22.

## 2022-04-18 NOTE — Progress Notes (Deleted)
Patient ID: Todd Whitehead, male    DOB: 03/07/1945, 77 y.o.   MRN: [id number]   Mr Kimball is a 77 y/o male with a history of HTN, anxiety, COPD, CAD (MI), PVD, current tobacco use and chronic heart failure.   Echo 01/27/21:  Left ventricular ejection fraction, by estimation, is 45 to 50%. The left ventricle has mildly decreased function. The left ventricle demonstrates global hypokinesis. There is mild left ventricular hypertrophy of the basal-septal segment. Left ventricular diastolic parameters are consistent with Grade I diastolic dysfunction  LHC 08/19/15: Mid LAD lesion, 80% stenosed. Mid Cx lesion, 85% stenosed. Mid RCA lesion, 100% stenosed. LM lesion, 50% stenosed. 3 vessel cad. Mildly reduced lv funciton  Has not been admitted or been in the ED in the last 6 months.     He presents today for a HF follow up visit with a chief complaint of   Past Medical History:  Diagnosis Date   Anxiety    Arthritis    CHF (congestive heart failure) ([place])    Colon polyp    COPD (chronic obstructive pulmonary disease) ([place])    smoker   Dysrhythmia    Glaucoma    Heart murmur    Hypertension    on  no meds   Low serum vitamin B12 11/18/2020   Melanoma ([place])    Resected from Left upper arm.    Myocardial infarction [facility]) 2017   Peripheral vascular disease [facility])    Prostate enlargement    Past Surgical History:  Procedure Laterality Date   BACK SURGERY  01/19/98   CARDIAC CATHETERIZATION N/A 08/19/2015   Procedure: Left Heart Cath and Coronary Angiography;  Surgeon: [name], MD;  Location: [place] [facility];  Service: Cardiovascular;  Laterality: N/A;   CHOLECYSTECTOMY N/A 08/19/2016   Procedure: LAPAROSCOPIC CHOLECYSTECTOMY WITH INTRAOPERATIVE CHOLANGIOGRAM;  Surgeon: [name], MD;  Location: [place];  Service: General;  Laterality: N/A;   COLON SURGERY  age 53 days old   " bowel was twisted up"   COLONOSCOPY WITH PROPOFOL N/A 03/19/2016   Procedure: COLONOSCOPY WITH  PROPOFOL;  Surgeon: [name], MD;  Location: [facility] ENDOSCOPY;  Service: Endoscopy;  Laterality: N/A;   CORONARY ARTERY BYPASS GRAFT  2017   ERCP N/A 08/18/2016   Procedure: ENDOSCOPIC RETROGRADE CHOLANGIOPANCREATOGRAPHY (ERCP);  Surgeon: [name], MD;  Location: [place];  Service: Gastroenterology;  Laterality: N/A;   ESOPHAGOGASTRODUODENOSCOPY (EGD) WITH PROPOFOL N/A 09/18/2018   Procedure: ESOPHAGOGASTRODUODENOSCOPY (EGD) WITH PROPOFOL;  Surgeon: [name], DO;  Location: [place];  Service: General;  Laterality: N/A;   EYE SURGERY Left 03/26/11   LOWER EXTREMITY ANGIOGRAPHY Left 10/03/2017   Procedure: LOWER EXTREMITY ANGIOGRAPHY;  Surgeon: [name], MD;  Location: [place] [facility];  Service: Cardiovascular;  Laterality: Left;   LOWER EXTREMITY ANGIOGRAPHY Right 10/31/2017   Procedure: LOWER EXTREMITY ANGIOGRAPHY;  Surgeon: [name], MD;  Location: [place] [facility];  Service: Cardiovascular;  Laterality: Right;   LUMBAR LAMINECTOMY/DECOMPRESSION MICRODISCECTOMY Right 10/02/2012   Procedure: Right Lumbar Five-Sacral One Lumbar laminotomy/Microdiskectomy;  Surgeon: [name], MD;  Location: [place] NEURO ORS;  Service: Neurosurgery;  Laterality: Right;  Right Lumbar Five-Sacral One Lumbar laminotomy/Microdiskectomy   VISCERAL ANGIOGRAPHY N/A 11/08/2020   Procedure: VISCERAL ANGIOGRAPHY;  Surgeon: Katha Cabal, MD;  Location: [place] [facility];  Service: Cardiovascular;  Laterality: N/A;   VISCERAL ANGIOGRAPHY N/A 11/25/2020   Procedure: VISCERAL ANGIOGRAPHY;  Surgeon: Katha Cabal, MD;  Location:  [place] [facility];  Service: Cardiovascular;  Laterality: N/A;   Family History  Problem Relation Age of Onset   Heart attack Father    Aneurysm Brother    Lung cancer Brother    Social History   Tobacco Use   Smoking status: Every Day    Packs/day: 1.50    Years: 58.00    Additional pack years: 0.00    Total pack years: 87.00    Types:  Cigarettes   Smokeless tobacco: Never  Substance Use Topics   Alcohol use: No   Allergies  Allergen Reactions   Other Nausea And Vomiting    Cheese,butter,sour cream,cottage cheese   Poison Ivy Extract Rash   Poison Oak Extract Rash     Review of Systems  Constitutional:  Positive for fatigue (improving). Negative for appetite change.  HENT:  Negative for congestion, postnasal drip and sore throat.   Eyes: Negative.   Respiratory:  Positive for cough and shortness of breath (with little exertion). Negative for chest tightness and wheezing.   Cardiovascular:  Negative for chest pain, palpitations and leg swelling.  Gastrointestinal:  Negative for abdominal distention and abdominal pain.  Endocrine: Negative.   Genitourinary: Negative.   Musculoskeletal:  Negative for back pain and neck pain.  Skin: Negative.   Allergic/Immunologic: Negative.   Neurological:  Negative for dizziness and light-headedness.  Hematological:  Negative for adenopathy. Does not bruise/bleed easily.  Psychiatric/Behavioral:  Positive for dysphoric mood. Negative for sleep disturbance (sleeping on 1 pillow). The patient is nervous/anxious.      Physical Exam Vitals and nursing note reviewed.  Constitutional:      General: He is not in acute distress.    Appearance: Normal appearance. He is ill-appearing. He is not toxic-appearing.  HENT:     Head: Normocephalic and atraumatic.  Cardiovascular:     Rate and Rhythm: Normal rate and regular rhythm.     Pulses: Normal pulses.     Heart sounds: No murmur heard. Pulmonary:     Effort: Pulmonary effort is normal. No tachypnea.     Breath sounds: No wheezing, rhonchi or rales.  Abdominal:     General: Abdomen is flat.     Palpations: Abdomen is soft.     Tenderness: There is no abdominal tenderness.  Musculoskeletal:        General: No tenderness.     Cervical back: Normal range of motion and neck supple.     Right lower leg: No tenderness. No edema.      Left lower leg: No tenderness. No edema.  Skin:    General: Skin is warm and dry.     Nails: There is clubbing.  Neurological:     General: No focal deficit present.     Mental Status: He is alert and oriented to person, place, and time.  Psychiatric:        Mood and Affect: Mood is anxious.        Behavior: Behavior normal.        Thought Content: Thought content normal.     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)- - NYHA class III - euvolemic today - weighing daily; reminded to call for an overnight weight gain of > 2 pounds or a weekly weight gain of > 5 pounds - weight 118 from last visit here 6 months ago  - not adding salt but does like saltier foods; likes to eat bacon  every morning for breakfast, reviewed making lower sodium choices - saw cardiology Clayborn Bigness) 01/04/22 - metoprolol - entresto - farxiga - getting entresto and farxiga through patient assistance - BNP 12/09/20 was 1527.4  2: HTN- - BP  - saw PCP Ouida Sills) 03/28/22 - BMP 02/06/22 reviewed and showed sodium 137, potassium 4.0, creatinine 1.4 & GFR 52  3: severe COPD- - has weaned himself off of oxygen although he does wear it at bedtime - saw pulmonology Raul Del) 05/03/21  4: Tobacco use- - smoking 1.0 ppd of cigarettes; at his peak he was smoking 3 ppd - emphasized to remove himself from the oxygen tank when smoking - has been smoking since the age of 13  5: Anemia - saw hematology Tasia Catchings) 02/16/22 - hemoglobin 02/06/22 was 11.9  6: Depression with anxiety- - he does endorse worsening depression - doesn't feel like his current medication regimen is working - emphasized that he call his PCP to discuss this further - emotional support gi.my?

## 2022-06-27 ENCOUNTER — Telehealth: Payer: Self-pay

## 2022-06-27 NOTE — Telephone Encounter (Signed)
Patient called today stating that he received a notice of Entresto delivery but could not find this delivery. Patient had been on hold with [employer] for >1 hour prior to calling the clinic. Patient reports having >30 days supply of Entresto at home. He was advised to look for delivery today and call back to the clinic if the delivery is still missing tomorrow. This issue was relayed to the patient advocate for assistance.

## 2022-08-17 ENCOUNTER — Inpatient Hospital Stay: Payer: Medicare Other

## 2022-08-17 ENCOUNTER — Inpatient Hospital Stay: Payer: Medicare Other | Attending: Oncology

## 2022-08-17 DIAGNOSIS — D472 Monoclonal gammopathy: Secondary | ICD-10-CM | POA: Diagnosis present

## 2022-08-17 DIAGNOSIS — Z801 Family history of malignant neoplasm of trachea, bronchus and lung: Secondary | ICD-10-CM | POA: Insufficient documentation

## 2022-08-17 DIAGNOSIS — Z85820 Personal history of malignant melanoma of skin: Secondary | ICD-10-CM | POA: Insufficient documentation

## 2022-08-17 DIAGNOSIS — N183 Chronic kidney disease, stage 3 unspecified: Secondary | ICD-10-CM | POA: Insufficient documentation

## 2022-08-17 DIAGNOSIS — F17210 Nicotine dependence, cigarettes, uncomplicated: Secondary | ICD-10-CM | POA: Insufficient documentation

## 2022-08-17 DIAGNOSIS — D649 Anemia, unspecified: Secondary | ICD-10-CM | POA: Insufficient documentation

## 2022-08-17 LAB — CBC WITH DIFFERENTIAL/PLATELET
Abs Immature Granulocytes: 0.02 10*3/uL (ref 0.00–0.07)
Basophils Absolute: 0 10*3/uL (ref 0.0–0.1)
Basophils Relative: 0 %
Eosinophils Absolute: 0.2 10*3/uL (ref 0.0–0.5)
Eosinophils Relative: 2 %
HCT: 33.6 % — ABNORMAL LOW (ref 39.0–52.0)
Hemoglobin: 11.4 g/dL — ABNORMAL LOW (ref 13.0–17.0)
Immature Granulocytes: 0 %
Lymphocytes Relative: 22 %
Lymphs Abs: 1.4 10*3/uL (ref 0.7–4.0)
MCH: 30.1 pg (ref 26.0–34.0)
MCHC: 33.9 g/dL (ref 30.0–36.0)
MCV: 88.7 fL (ref 80.0–100.0)
Monocytes Absolute: 0.6 10*3/uL (ref 0.1–1.0)
Monocytes Relative: 9 %
Neutro Abs: 4.4 10*3/uL (ref 1.7–7.7)
Neutrophils Relative %: 67 %
Platelets: 197 10*3/uL (ref 150–400)
RBC: 3.79 MIL/uL — ABNORMAL LOW (ref 4.22–5.81)
RDW: 14.1 % (ref 11.5–15.5)
WBC: 6.7 10*3/uL (ref 4.0–10.5)
nRBC: 0 % (ref 0.0–0.2)

## 2022-08-17 LAB — COMPREHENSIVE METABOLIC PANEL
ALT: 30 U/L (ref 0–44)
AST: 37 U/L (ref 15–41)
Albumin: 4.1 g/dL (ref 3.5–5.0)
Alkaline Phosphatase: 77 U/L (ref 38–126)
Anion gap: 8 (ref 5–15)
BUN: 21 mg/dL (ref 8–23)
CO2: 24 mmol/L (ref 22–32)
Calcium: 9 mg/dL (ref 8.9–10.3)
Chloride: 100 mmol/L (ref 98–111)
Creatinine, Ser: 1.41 mg/dL — ABNORMAL HIGH (ref 0.61–1.24)
GFR, Estimated: 51 mL/min — ABNORMAL LOW (ref >60)
Glucose, Bld: 140 mg/dL — ABNORMAL HIGH (ref 70–99)
Potassium: 3.8 mmol/L (ref 3.5–5.1)
Sodium: 132 mmol/L — ABNORMAL LOW (ref 135–145)
Total Bilirubin: 0.3 mg/dL (ref 0.3–1.2)
Total Protein: 7 g/dL (ref 6.5–8.1)

## 2022-08-20 LAB — KAPPA/LAMBDA LIGHT CHAINS
Kappa free light chain: 28.1 mg/L — ABNORMAL HIGH (ref 3.3–19.4)
Kappa, lambda light chain ratio: 0.55 (ref 0.26–1.65)
Lambda free light chains: 51 mg/L — ABNORMAL HIGH (ref 5.7–26.3)

## 2022-08-22 LAB — MULTIPLE MYELOMA PANEL, SERUM
Albumin SerPl Elph-Mcnc: 3.8 g/dL (ref 2.9–4.4)
Albumin/Glob SerPl: 1.5 (ref 0.7–1.7)
Alpha 1: 0.2 g/dL (ref 0.0–0.4)
Alpha2 Glob SerPl Elph-Mcnc: 0.7 g/dL (ref 0.4–1.0)
B-Globulin SerPl Elph-Mcnc: 0.8 g/dL (ref 0.7–1.3)
Gamma Glob SerPl Elph-Mcnc: 0.9 g/dL (ref 0.4–1.8)
Globulin, Total: 2.6 g/dL (ref 2.2–3.9)
IgA: 121 mg/dL (ref 61–437)
IgG (Immunoglobin G), Serum: 903 mg/dL (ref 603–1613)
IgM (Immunoglobulin M), Srm: 70 mg/dL (ref 15–143)
M Protein SerPl Elph-Mcnc: 0.4 g/dL — ABNORMAL HIGH
Total Protein ELP: 6.4 g/dL (ref 6.0–8.5)

## 2022-08-27 ENCOUNTER — Inpatient Hospital Stay (HOSPITAL_BASED_OUTPATIENT_CLINIC_OR_DEPARTMENT_OTHER): Payer: Medicare Other | Admitting: Oncology

## 2022-08-27 ENCOUNTER — Encounter: Payer: Self-pay | Admitting: Oncology

## 2022-08-27 VITALS — BP 153/77 | HR 72 | Temp 97.8°F | Resp 18 | Wt 113.5 lb

## 2022-08-27 DIAGNOSIS — F17210 Nicotine dependence, cigarettes, uncomplicated: Secondary | ICD-10-CM

## 2022-08-27 DIAGNOSIS — N1831 Chronic kidney disease, stage 3a: Secondary | ICD-10-CM

## 2022-08-27 DIAGNOSIS — F17200 Nicotine dependence, unspecified, uncomplicated: Secondary | ICD-10-CM

## 2022-08-27 DIAGNOSIS — Z720 Tobacco use: Secondary | ICD-10-CM

## 2022-08-27 DIAGNOSIS — N183 Chronic kidney disease, stage 3 unspecified: Secondary | ICD-10-CM | POA: Insufficient documentation

## 2022-08-27 DIAGNOSIS — D649 Anemia, unspecified: Secondary | ICD-10-CM

## 2022-08-27 DIAGNOSIS — D472 Monoclonal gammopathy: Secondary | ICD-10-CM | POA: Diagnosis not present

## 2022-08-27 NOTE — Progress Notes (Signed)
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 Lab Results  Component Value Date   MPROTEIN 0.4 (H) 08/17/2022   KPAFRELGTCHN 28.1 (H) 08/17/2022   LAMBDASER 51.0 (H) 08/17/2022   KAPLAMBRATIO 0.55 08/17/2022     Slight increase of  M protein and stable light chain ratio.  Recommend observation, repeat labs in 6 months. Check skeletal survey  Anemia Hemoglobin is stable. Monitor Check B12, Folate, iron tibc ferritin at next visit  CKD (chronic kidney disease) stage 3, GFR 30-59 ml/min (HCC) Encourage oral hydration and avoid nephrotoxins.    Tobacco abuse Refer to lung cancer screening program Recommend smoke cessation.    Orders Placed This Encounter  Procedures   DG Bone Survey Met    Standing Status:   Future    Standing Expiration Date:   02/27/2023    Order Specific Question:   Reason for Exam (SYMPTOM  OR DIAGNOSIS REQUIRED)    Answer:   MGUS    Order Specific Question:   Preferred imaging location?    Answer:   [facility]   CMP (Cancer Center only)    Standing Status:   Future    Standing Expiration Date:   08/27/2023   CBC with Differential (Cancer Center Only)    Standing Status:   Future    Standing Expiration Date:   08/27/2023   Kappa/lambda light chains    Standing Status:   Future    Standing Expiration Date:   08/27/2023   Multiple Myeloma Panel (SPEP&IFE w/QIG)    Standing Status:   Future    Standing Expiration Date:   08/27/2023   Ambulatory Referral for Lung Cancer Screening    Referral Priority:   Routine    Referral Type:   Consultation    Referral Reason:   Specialty Services Required    Number of Visits Requested:   1   Follow up in 6 months.  All questions were answered. The patient knows to call the  clinic with any problems, questions or concerns.  Todd Patience, MD, PhD [facility] Hematology Oncology 08/27/2022   HISTORY OF PRESENTING ILLNESS:   Todd Whitehead is a  77 y.o.  male presents for follow up  Patient was accompanied by her friend Todd Whitehead. 8/31 2022, CBC showed a hemoglobin of 10.3, hematocrit 29.5, WBC 5.7  10/20/2020, iron panel showed decreased iron saturation of 12, ferritin 140, TIBC 265.  Patient reports feeling fatigued and tired.  Denies any hematemesis or hematochezia. Patient has chronic abdominal pain, and only is able to eat 1 meal per day.  Patient has significant weight loss as well as some nausea.  07/06/2020 CT abdomen pelvis showed no acute findings.  No explanation of patient's unintended weight loss.  Mucous plugging seen within the right lower lobe bronchi with minimal right base atelectasis or early infiltrate. Patient follows up with gastroenterology Todd Whitehead, has also been seen by surgery to [facility].  Surgical intervention was not recommended.   09/13/2020, CT abdomen pelvis with contrast at [facility] showed extensive atherosclerotic disease.  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  Previous cholecystectomy.  Prominence, bile duct.  Cyst of both kidneys.  Negative for hydronephrosis.  Moderate distention urinary bladder.  Questionable bladder outlet obstruction.  Prostate gland is mildly enlarged and heterogeneous. Patient was last  seen by Todd Whitehead on 10/20/2020.  GI recommends EGD and colonoscopy for further evaluation of his symptoms. Patient was seen by vascular surgery Todd Whitehead on 10/24/2020.  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 11/08/2020, aortogram showed a diffusely diseased no hemodynamically significant lesions identified.  SMA was widely patent.  Celiac demonstrates greater than 90% stenosis at its origin but distal to the stenosis appears to be widely patent.  Patient will be rescheduled with plan of upper extremity access  which will allow leverage enabling treatment of the lesion and revascularization and stent placement.  Patient was referred to establish care with hematology for evaluation of anemia.  01/26/2021 - 01/30/2021, patient was hospitalized due to hemoptysis, acute on chronic systolic CHF, community-acquired pneumonia, acute hypoxic respiratory failure.  2D echo showed EF 45-50%, grade 1 diastolic dysfunction.  Patient follows up with pulmonology for a suspicious nodule in his lung.  04/27/2021, CT chest without contrast showed interval resolution of the previously seen spiculated nodule of the dependent right lung base.  As well as bilateral airspace disease.  Consistent with resolution of infection or inflammation.  Severe emphysema, diffuse bronchial wall thickening.  CAD.  # History of melanoma  INTERVAL HISTORY Todd Whitehead is a 77 y.o. male who has above history reviewed by me today presents for follow up of MGUS, anemia He has lost some weight  No fever, chills, nausea, vomiting.  He has decreased alcohol drinking, only socially.   Review of Systems  Constitutional:  Positive for fatigue. Negative for appetite change, chills, diaphoresis, fever and unexpected weight change.  HENT:   Negative for hearing loss, lump/mass, nosebleeds and sore throat.   Eyes:  Negative for eye problems and icterus.  Respiratory:  Negative for chest tightness, cough, hemoptysis, shortness of breath and wheezing.   Cardiovascular:  Negative for chest pain and leg swelling.  Gastrointestinal:  Negative for abdominal distention, abdominal pain, blood in stool, diarrhea, nausea and rectal pain.  Endocrine: Negative for hot flashes.  Genitourinary:  Negative for bladder incontinence, difficulty urinating, dysuria, frequency, hematuria and nocturia.   Musculoskeletal:  Negative for back pain, flank pain, gait problem and myalgias.  Skin:  Negative for rash.  Neurological:  Negative for dizziness, gait problem,  headaches, numbness and seizures.  Hematological:  Negative for adenopathy. Does not bruise/bleed easily.  Psychiatric/Behavioral:  Negative for confusion and decreased concentration. The patient is not nervous/anxious.     MEDICAL HISTORY:  Past Medical History:  Diagnosis Date   Anxiety    Arthritis    CHF (congestive heart failure) (HCC)    Colon polyp    COPD (chronic obstructive pulmonary disease) (HCC)    smoker   Dysrhythmia    Glaucoma    Heart murmur    Hypertension    on  no meds   Low serum vitamin B12 11/18/2020   Melanoma (HCC)    Resected from Left upper arm.    Myocardial infarction [facility]) 2017   Peripheral vascular disease [facility])    Prostate enlargement     SURGICAL HISTORY: Past Surgical History:  Procedure Laterality Date   BACK SURGERY  01/19/98   CARDIAC CATHETERIZATION N/A 08/19/2015   Procedure: Left Heart Cath and Coronary Angiography;  Surgeon: Dalia Heading, MD;  Location: [facility] INVASIVE CV LAB;  Service: Cardiovascular;  Laterality: N/A;   CHOLECYSTECTOMY N/A 08/19/2016   Procedure: LAPAROSCOPIC CHOLECYSTECTOMY WITH INTRAOPERATIVE CHOLANGIOGRAM;  Surgeon: Almond Lint, MD;  Location: [facility] OR;  Service: General;  Laterality: N/A;  COLON SURGERY  age 85 days old   " bowel was twisted up"   COLONOSCOPY WITH PROPOFOL N/A 03/19/2016   Procedure: COLONOSCOPY WITH PROPOFOL;  Surgeon: Christena Deem, MD;  Location: [facility] ENDOSCOPY;  Service: Endoscopy;  Laterality: N/A;   CORONARY ARTERY BYPASS GRAFT  2017   ERCP N/A 08/18/2016   Procedure: ENDOSCOPIC RETROGRADE CHOLANGIOPANCREATOGRAPHY (ERCP);  Surgeon: Kerin Salen, MD;  Location: [facility] ENDOSCOPY;  Service: Gastroenterology;  Laterality: N/A;   ESOPHAGOGASTRODUODENOSCOPY (EGD) WITH PROPOFOL N/A 09/18/2018   Procedure: ESOPHAGOGASTRODUODENOSCOPY (EGD) WITH PROPOFOL;  Surgeon: Sung Amabile, DO;  Location: [facility] ENDOSCOPY;  Service: General;  Laterality: N/A;   EYE SURGERY Left 03/26/11   LOWER EXTREMITY ANGIOGRAPHY  Left 10/03/2017   Procedure: LOWER EXTREMITY ANGIOGRAPHY;  Surgeon: Annice Needy, MD;  Location: [facility] INVASIVE CV LAB;  Service: Cardiovascular;  Laterality: Left;   LOWER EXTREMITY ANGIOGRAPHY Right 10/31/2017   Procedure: LOWER EXTREMITY ANGIOGRAPHY;  Surgeon: Annice Needy, MD;  Location: [facility] INVASIVE CV LAB;  Service: Cardiovascular;  Laterality: Right;   LUMBAR LAMINECTOMY/DECOMPRESSION MICRODISCECTOMY Right 10/02/2012   Procedure: Right Lumbar Five-Sacral One Lumbar laminotomy/Microdiskectomy;  Surgeon: Hewitt Shorts, MD;  Location: [facility] NEURO ORS;  Service: Neurosurgery;  Laterality: Right;  Right Lumbar Five-Sacral One Lumbar laminotomy/Microdiskectomy   VISCERAL ANGIOGRAPHY N/A 11/08/2020   Procedure: VISCERAL ANGIOGRAPHY;  Surgeon: Renford Dills, MD;  Location: [facility] INVASIVE CV LAB;  Service: Cardiovascular;  Laterality: N/A;   VISCERAL ANGIOGRAPHY N/A 11/25/2020   Procedure: VISCERAL ANGIOGRAPHY;  Surgeon: Renford Dills, MD;  Location: [facility] INVASIVE CV LAB;  Service: Cardiovascular;  Laterality: N/A;    SOCIAL HISTORY: Social History   Socioeconomic History   Marital status: Divorced    Spouse name: Not on file   Number of children: Not on file   Years of education: Not on file   Highest education level: Not on file  Occupational History   Not on file  Tobacco Use   Smoking status: Every Day    Current packs/day: 1.50    Average packs/day: 1.5 packs/day for 58.0 years (87.0 ttl pk-yrs)    Types: Cigarettes   Smokeless tobacco: Never  Vaping Use   Vaping status: Former  Substance and Sexual Activity   Alcohol use: No   Drug use: No   Sexual activity: Not Currently  Other Topics Concern   Not on file  Social History Narrative   Not on file   Social Determinants of Health   Financial Resource Strain: Not on file  Food Insecurity: Not on file  Transportation Needs: Not on file  Physical Activity: Not on file  Stress: Not on file  Social Connections: Not on  file  Intimate Partner Violence: Not on file    FAMILY HISTORY: Family History  Problem Relation Age of Onset   Heart attack Father    Aneurysm Brother    Lung cancer Brother     ALLERGIES:  is allergic to other, poison ivy extract, and poison oak extract.  MEDICATIONS:  Current Outpatient Medications  Medication Sig Dispense Refill   acetaminophen (TYLENOL) 500 MG tablet Take 1 tablet (500 mg total) by mouth every 6 (six) hours as needed. 30 tablet 0   albuterol (PROVENTIL) (2.5 MG/3ML) 0.083% nebulizer solution Take 3 mLs (2.5 mg total) by nebulization every 6 (six) hours as needed for wheezing or shortness of breath. 75 mL 12   ALPRAZolam (XANAX) 0.5 MG tablet Take 0.5 mg by mouth at bedtime.     aspirin EC  81 MG tablet Take 81 mg by mouth daily. Swallow whole.     atorvastatin (LIPITOR) 40 MG tablet Take 40 mg by mouth daily.      brimonidine (ALPHAGAN) 0.2 % ophthalmic solution Place 1 drop into both eyes 2 (two) times daily.     busPIRone (BUSPAR) 30 MG tablet Take 30 mg by mouth 2 (two) times daily.     citalopram (CELEXA) 20 MG tablet Take 20 mg by mouth daily.     clopidogrel (PLAVIX) 75 MG tablet TAKE 1 TABLET(75 MG) BY MOUTH DAILY 30 tablet 5   dapagliflozin propanediol (FARXIGA) 10 MG TABS tablet Take 1 tablet (10 mg total) by mouth daily before breakfast. 90 tablet 3   ferrous sulfate 325 (65 FE) MG EC tablet TAKE 1 TABLET(325 MG) BY MOUTH TWICE DAILY WITH A MEAL 180 tablet 1   latanoprost (XALATAN) 0.005 % ophthalmic solution Place 1 drop into both eyes at bedtime.   2   metoprolol succinate (TOPROL-XL) 25 MG 24 hr tablet Take 12.5 mg by mouth daily.     Multiple Vitamin (MULTIVITAMIN WITH MINERALS) TABS tablet Take 1 tablet by mouth daily. 30 tablet 0   QUEtiapine (SEROQUEL) 25 MG tablet Take 1 tablet (25 mg total) by mouth at bedtime. (Patient taking differently: Take 25 mg by mouth 2 (two) times daily.) 30 tablet 0   sacubitril-valsartan (ENTRESTO) 49-51 MG Take 1  tablet by mouth 2 (two) times daily. 180 tablet 3   tamsulosin (FLOMAX) 0.4 MG CAPS capsule Take 1 capsule (0.4 mg total) by mouth daily after breakfast. 30 capsule 2   traMADol (ULTRAM) 50 MG tablet Take 1 tablet (50 mg total) by mouth every 12 (twelve) hours as needed. 30 tablet 2   traZODone (DESYREL) 50 MG tablet Take 50 mg by mouth at bedtime.     No current facility-administered medications for this visit.     PHYSICAL EXAMINATION: ECOG PERFORMANCE STATUS: 3 - Symptomatic, >50% confined to bed Vitals:   08/27/22 1025  BP: (!) 153/77  Pulse: 72  Resp: 18  Temp: 97.8 F (36.6 C)   Filed Weights   08/27/22 1025  Weight: 113 lb 8 oz (51.5 kg)    Physical Exam Constitutional:      General: He is not in acute distress.    Comments: Patient ambulates independently  HENT:     Head: Normocephalic.  Eyes:     General: No scleral icterus. Cardiovascular:     Rate and Rhythm: Normal rate and regular rhythm.  Pulmonary:     Effort: Pulmonary effort is normal.     Comments: Decreased breath sound bilaterally. Abdominal:     General: Bowel sounds are normal. There is no distension.     Palpations: Abdomen is soft.  Musculoskeletal:        General: No deformity. Normal range of motion.     Cervical back: Normal range of motion.  Skin:    General: Skin is warm and dry.  Neurological:     Mental Status: He is alert and oriented to person, place, and time. Mental status is at baseline.     Cranial Nerves: No cranial nerve deficit.  Psychiatric:        Mood and Affect: Mood normal.     LABORATORY DATA:  I have reviewed the data as listed    Latest Ref Rng & Units 08/17/2022   12:45 PM 02/06/2022    1:08 PM 08/16/2021   12:56 PM  CBC  WBC  4.0 - 10.5 K/uL 6.7  6.0  7.7   Hemoglobin 13.0 - 17.0 g/dL 16.1  09.6  04.5   Hematocrit 39.0 - 52.0 % 33.6  34.4  31.3   Platelets 150 - 400 K/uL 197  186  324       Latest Ref Rng & Units 08/17/2022   12:45 PM 02/06/2022    1:08 PM  08/16/2021   12:56 PM  CMP  Glucose 70 - 99 mg/dL 409  811  914   BUN 8 - 23 mg/dL 21  23  24    Creatinine 0.61 - 1.24 mg/dL 7.82  9.56  2.13   Sodium 135 - 145 mmol/L 132  137  129   Potassium 3.5 - 5.1 mmol/L 3.8  4.0  4.3   Chloride 98 - 111 mmol/L 100  104  100   CO2 22 - 32 mmol/L 24  25  23    Calcium 8.9 - 10.3 mg/dL 9.0  9.1  8.7   Total Protein 6.5 - 8.1 g/dL 7.0  6.9  6.8   Total Bilirubin 0.3 - 1.2 mg/dL 0.3  0.5  0.6   Alkaline Phos 38 - 126 U/L 77  80  74   AST 15 - 41 U/L 37  27  28   ALT 0 - 44 U/L 30  20  26      RADIOGRAPHIC STUDIES: I have personally reviewed the radiological images as listed and agreed with the findings in the report. No results found.

## 2022-08-27 NOTE — Assessment & Plan Note (Addendum)
Refer to lung cancer screening program Recommend smoke cessation.

## 2022-08-27 NOTE — Assessment & Plan Note (Addendum)
Hemoglobin is stable. Monitor Check B12, Folate, iron tibc ferritin at next visit

## 2022-08-27 NOTE — Assessment & Plan Note (Signed)
Encourage oral hydration and avoid nephrotoxins.   

## 2022-08-27 NOTE — Assessment & Plan Note (Addendum)
IgG kappa MGUS Labs are reviewed and discussed with patient. Lab Results  Component Value Date   MPROTEIN 0.4 (H) 08/17/2022   KPAFRELGTCHN 28.1 (H) 08/17/2022   LAMBDASER 51.0 (H) 08/17/2022   KAPLAMBRATIO 0.55 08/17/2022     Slight increase of  M protein and stable light chain ratio.  Recommend observation, repeat labs in 6 months. Check skeletal survey

## 2022-11-05 ENCOUNTER — Telehealth: Payer: Self-pay | Admitting: *Deleted

## 2022-11-05 ENCOUNTER — Encounter: Payer: Self-pay | Admitting: Oncology

## 2022-11-05 ENCOUNTER — Other Ambulatory Visit: Payer: Self-pay | Admitting: Oncology

## 2022-11-05 ENCOUNTER — Other Ambulatory Visit (HOSPITAL_COMMUNITY): Payer: Self-pay

## 2022-11-05 NOTE — Telephone Encounter (Signed)
Patient came into office to bring letter he received from [employer], pt is currently enrolled in assistance through 01/29/23.  Per letter pt will need to re-enroll for 2025 this is not done automatically, however beginning Sept '24 [employer] will run electronic income verification to determine eligibility for 2025.  Per pharmacy team copay is only $11.20/90 tabs, pt expresses this is a lot and he would like to continue receiving from company if possible.  Re-enrollment application completed, signed by pt and Clarisa Kindred, NP and faxed into [employer]

## 2023-01-01 ENCOUNTER — Other Ambulatory Visit (HOSPITAL_COMMUNITY): Payer: Self-pay

## 2023-01-01 ENCOUNTER — Telehealth: Payer: Self-pay | Admitting: Pharmacist

## 2023-01-01 NOTE — Telephone Encounter (Signed)
Patient no longer qualifies for patient assistance because he was approved for Medicare extra help. All branded HF medications are now $11 for 90 day supply.

## 2023-02-19 ENCOUNTER — Inpatient Hospital Stay: Payer: Medicare Other | Attending: Oncology

## 2023-02-27 ENCOUNTER — Inpatient Hospital Stay: Payer: Medicare Other | Admitting: Oncology

## 2023-03-15 ENCOUNTER — Emergency Department: Payer: Medicare Other

## 2023-03-15 ENCOUNTER — Inpatient Hospital Stay
Admission: EM | Admit: 2023-03-15 | Discharge: 2023-03-19 | DRG: 193 | Disposition: A | Discharge status: Home Health | Payer: Medicare Other | Attending: Internal Medicine | Admitting: Internal Medicine

## 2023-03-15 ENCOUNTER — Other Ambulatory Visit: Payer: Self-pay

## 2023-03-15 DIAGNOSIS — Z720 Tobacco use: Secondary | ICD-10-CM | POA: Diagnosis present

## 2023-03-15 DIAGNOSIS — Z801 Family history of malignant neoplasm of trachea, bronchus and lung: Secondary | ICD-10-CM

## 2023-03-15 DIAGNOSIS — E1122 Type 2 diabetes mellitus with diabetic chronic kidney disease: Secondary | ICD-10-CM | POA: Diagnosis present

## 2023-03-15 DIAGNOSIS — J189 Pneumonia, unspecified organism: Secondary | ICD-10-CM | POA: Diagnosis not present

## 2023-03-15 DIAGNOSIS — D631 Anemia in chronic kidney disease: Secondary | ICD-10-CM | POA: Diagnosis present

## 2023-03-15 DIAGNOSIS — L237 Allergic contact dermatitis due to plants, except food: Secondary | ICD-10-CM | POA: Diagnosis present

## 2023-03-15 DIAGNOSIS — I252 Old myocardial infarction: Secondary | ICD-10-CM

## 2023-03-15 DIAGNOSIS — J440 Chronic obstructive pulmonary disease with acute lower respiratory infection: Secondary | ICD-10-CM | POA: Diagnosis present

## 2023-03-15 DIAGNOSIS — J432 Centrilobular emphysema: Secondary | ICD-10-CM | POA: Diagnosis present

## 2023-03-15 DIAGNOSIS — Z716 Tobacco abuse counseling: Secondary | ICD-10-CM

## 2023-03-15 DIAGNOSIS — I5022 Chronic systolic (congestive) heart failure: Secondary | ICD-10-CM | POA: Diagnosis present

## 2023-03-15 DIAGNOSIS — F419 Anxiety disorder, unspecified: Secondary | ICD-10-CM | POA: Diagnosis present

## 2023-03-15 DIAGNOSIS — I10 Essential (primary) hypertension: Secondary | ICD-10-CM | POA: Diagnosis present

## 2023-03-15 DIAGNOSIS — F32A Depression, unspecified: Secondary | ICD-10-CM | POA: Diagnosis present

## 2023-03-15 DIAGNOSIS — D472 Monoclonal gammopathy: Secondary | ICD-10-CM | POA: Diagnosis present

## 2023-03-15 DIAGNOSIS — Z860100 Personal history of colon polyps, unspecified: Secondary | ICD-10-CM

## 2023-03-15 DIAGNOSIS — Z951 Presence of aortocoronary bypass graft: Secondary | ICD-10-CM

## 2023-03-15 DIAGNOSIS — J438 Other emphysema: Secondary | ICD-10-CM | POA: Diagnosis present

## 2023-03-15 DIAGNOSIS — Z91018 Allergy to other foods: Secondary | ICD-10-CM

## 2023-03-15 DIAGNOSIS — Z515 Encounter for palliative care: Secondary | ICD-10-CM

## 2023-03-15 DIAGNOSIS — E1165 Type 2 diabetes mellitus with hyperglycemia: Secondary | ICD-10-CM | POA: Diagnosis present

## 2023-03-15 DIAGNOSIS — N183 Chronic kidney disease, stage 3 unspecified: Secondary | ICD-10-CM | POA: Diagnosis present

## 2023-03-15 DIAGNOSIS — I130 Hypertensive heart and chronic kidney disease with heart failure and stage 1 through stage 4 chronic kidney disease, or unspecified chronic kidney disease: Secondary | ICD-10-CM | POA: Diagnosis present

## 2023-03-15 DIAGNOSIS — Z9049 Acquired absence of other specified parts of digestive tract: Secondary | ICD-10-CM

## 2023-03-15 DIAGNOSIS — J441 Chronic obstructive pulmonary disease with (acute) exacerbation: Secondary | ICD-10-CM

## 2023-03-15 DIAGNOSIS — R64 Cachexia: Secondary | ICD-10-CM | POA: Diagnosis present

## 2023-03-15 DIAGNOSIS — R0489 Hemorrhage from other sites in respiratory passages: Secondary | ICD-10-CM | POA: Diagnosis present

## 2023-03-15 DIAGNOSIS — E785 Hyperlipidemia, unspecified: Secondary | ICD-10-CM | POA: Diagnosis present

## 2023-03-15 DIAGNOSIS — F17200 Nicotine dependence, unspecified, uncomplicated: Secondary | ICD-10-CM | POA: Diagnosis present

## 2023-03-15 DIAGNOSIS — E1151 Type 2 diabetes mellitus with diabetic peripheral angiopathy without gangrene: Secondary | ICD-10-CM | POA: Diagnosis present

## 2023-03-15 DIAGNOSIS — Z85820 Personal history of malignant melanoma of skin: Secondary | ICD-10-CM

## 2023-03-15 DIAGNOSIS — R042 Hemoptysis: Secondary | ICD-10-CM

## 2023-03-15 DIAGNOSIS — Z7982 Long term (current) use of aspirin: Secondary | ICD-10-CM

## 2023-03-15 DIAGNOSIS — Z66 Do not resuscitate: Secondary | ICD-10-CM | POA: Diagnosis present

## 2023-03-15 DIAGNOSIS — Z79899 Other long term (current) drug therapy: Secondary | ICD-10-CM

## 2023-03-15 DIAGNOSIS — Z1152 Encounter for screening for COVID-19: Secondary | ICD-10-CM

## 2023-03-15 DIAGNOSIS — F418 Other specified anxiety disorders: Secondary | ICD-10-CM | POA: Diagnosis present

## 2023-03-15 DIAGNOSIS — I2510 Atherosclerotic heart disease of native coronary artery without angina pectoris: Secondary | ICD-10-CM | POA: Diagnosis present

## 2023-03-15 DIAGNOSIS — Z681 Body mass index (BMI) 19 or less, adult: Secondary | ICD-10-CM

## 2023-03-15 DIAGNOSIS — J9621 Acute and chronic respiratory failure with hypoxia: Secondary | ICD-10-CM | POA: Diagnosis present

## 2023-03-15 DIAGNOSIS — Z8249 Family history of ischemic heart disease and other diseases of the circulatory system: Secondary | ICD-10-CM

## 2023-03-15 DIAGNOSIS — J9601 Acute respiratory failure with hypoxia: Secondary | ICD-10-CM

## 2023-03-15 DIAGNOSIS — N400 Enlarged prostate without lower urinary tract symptoms: Secondary | ICD-10-CM | POA: Diagnosis present

## 2023-03-15 DIAGNOSIS — E871 Hypo-osmolality and hyponatremia: Secondary | ICD-10-CM | POA: Diagnosis present

## 2023-03-15 DIAGNOSIS — Z7902 Long term (current) use of antithrombotics/antiplatelets: Secondary | ICD-10-CM

## 2023-03-15 DIAGNOSIS — I959 Hypotension, unspecified: Secondary | ICD-10-CM | POA: Diagnosis present

## 2023-03-15 DIAGNOSIS — D649 Anemia, unspecified: Secondary | ICD-10-CM | POA: Diagnosis present

## 2023-03-15 DIAGNOSIS — F17210 Nicotine dependence, cigarettes, uncomplicated: Secondary | ICD-10-CM | POA: Diagnosis present

## 2023-03-15 DIAGNOSIS — Q2112 Patent foramen ovale: Secondary | ICD-10-CM

## 2023-03-15 LAB — CBC
HCT: 30.3 % — ABNORMAL LOW (ref 39.0–52.0)
Hemoglobin: 10.4 g/dL — ABNORMAL LOW (ref 13.0–17.0)
MCH: 30.8 pg (ref 26.0–34.0)
MCHC: 34.3 g/dL (ref 30.0–36.0)
MCV: 89.6 fL (ref 80.0–100.0)
Platelets: 178 10*3/uL (ref 150–400)
RBC: 3.38 MIL/uL — ABNORMAL LOW (ref 4.22–5.81)
RDW: 14.1 % (ref 11.5–15.5)
WBC: 8.1 10*3/uL (ref 4.0–10.5)
nRBC: 0 % (ref 0.0–0.2)

## 2023-03-15 LAB — COMPREHENSIVE METABOLIC PANEL
ALT: 19 U/L (ref 0–44)
AST: 33 U/L (ref 15–41)
Albumin: 3.9 g/dL (ref 3.5–5.0)
Alkaline Phosphatase: 73 U/L (ref 38–126)
Anion gap: 13 (ref 5–15)
BUN: 27 mg/dL — ABNORMAL HIGH (ref 8–23)
CO2: 22 mmol/L (ref 22–32)
Calcium: 9.1 mg/dL (ref 8.9–10.3)
Chloride: 95 mmol/L — ABNORMAL LOW (ref 98–111)
Creatinine, Ser: 1.25 mg/dL — ABNORMAL HIGH (ref 0.61–1.24)
GFR, Estimated: 59 mL/min — ABNORMAL LOW (ref >60)
Glucose, Bld: 211 mg/dL — ABNORMAL HIGH (ref 70–99)
Potassium: 4 mmol/L (ref 3.5–5.1)
Sodium: 130 mmol/L — ABNORMAL LOW (ref 135–145)
Total Bilirubin: 0.8 mg/dL (ref 0.0–1.2)
Total Protein: 6.8 g/dL (ref 6.5–8.1)

## 2023-03-15 LAB — RESP PANEL BY RT-PCR (RSV, FLU A&B, COVID)  RVPGX2
Influenza A by PCR: NEGATIVE
Influenza B by PCR: NEGATIVE
Resp Syncytial Virus by PCR: NEGATIVE
SARS Coronavirus 2 by RT PCR: NEGATIVE

## 2023-03-15 MED ORDER — SODIUM CHLORIDE 0.9 % IV SOLN
500.0000 mg | Freq: Once | INTRAVENOUS | Status: AC
  Administered 2023-03-16: 500 mg via INTRAVENOUS
  Filled 2023-03-15: qty 5

## 2023-03-15 MED ORDER — LACTATED RINGERS IV BOLUS (SEPSIS)
1000.0000 mL | Freq: Once | INTRAVENOUS | Status: AC
  Administered 2023-03-16: 1000 mL via INTRAVENOUS

## 2023-03-15 MED ORDER — SODIUM CHLORIDE 0.9 % IV SOLN
2.0000 g | Freq: Once | INTRAVENOUS | Status: AC
  Administered 2023-03-16: 2 g via INTRAVENOUS
  Filled 2023-03-15: qty 20

## 2023-03-15 MED ORDER — FENTANYL CITRATE PF 50 MCG/ML IJ SOSY
50.0000 ug | PREFILLED_SYRINGE | Freq: Once | INTRAMUSCULAR | Status: AC
  Administered 2023-03-16: 50 ug via INTRAVENOUS
  Filled 2023-03-15: qty 1

## 2023-03-15 MED ORDER — IPRATROPIUM-ALBUTEROL 0.5-2.5 (3) MG/3ML IN SOLN
3.0000 mL | RESPIRATORY_TRACT | Status: AC
  Administered 2023-03-16: 3 mL via RESPIRATORY_TRACT
  Filled 2023-03-15: qty 3

## 2023-03-15 MED ORDER — IOHEXOL 350 MG/ML SOLN
75.0000 mL | Freq: Once | INTRAVENOUS | Status: AC | PRN
  Administered 2023-03-15: 60 mL via INTRAVENOUS

## 2023-03-15 MED ORDER — IOHEXOL 300 MG/ML  SOLN: 75.0000 mL | Freq: Once | INTRAMUSCULAR | Status: DC | PRN

## 2023-03-15 MED ORDER — METHYLPREDNISOLONE SODIUM SUCC 125 MG IJ SOLR
125.0000 mg | Freq: Once | INTRAMUSCULAR | Status: AC
  Administered 2023-03-16: 125 mg via INTRAVENOUS
  Filled 2023-03-15: qty 2

## 2023-03-15 NOTE — ED Notes (Addendum)
Pt placed on 2 liter's [place] at this time. Pt has COPD- states he does not wear oxygen at home.

## 2023-03-15 NOTE — ED Triage Notes (Signed)
Pt sts that yesterday he started coughing up blood. Pt advised that it has continued today and he also got a nose bleed today also. Pt sts that he does take aspirin daily.

## 2023-03-15 NOTE — ED Provider Triage Note (Signed)
Emergency Medicine Provider Triage Evaluation Note  Todd Whitehead , a 78 y.o. male  was evaluated in triage.  Pt complains of hemoptysis.  Coughing up blood for about 3 days.  No fevers or chills.  No history of similar..  Review of Systems  Positive:  Negative:   Physical Exam  BP (!) 104/57 (BP Location: Right Arm)   Pulse 76   Temp 98.3 F (36.8 C) (Oral)   Resp 18   Ht 5\' 8"  (1.727 m)   Wt 52.2 kg   SpO2 93%   BMI 17.49 kg/m  Gen:   Awake, no distress   Resp:  Normal effort  MSK:   Moves extremities without difficulty  Other:    Medical Decision Making  Medically screening exam initiated at 5:12 PM.  Appropriate orders placed.  Todd Whitehead was informed that the remainder of the evaluation will be completed by another provider, this initial triage assessment does not replace that evaluation, and the importance of remaining in the ED until their evaluation is complete.  Labs and CT angiogram   Christen Bame, Cordelia Poche 03/15/23 1713

## 2023-03-15 NOTE — ED Provider Notes (Signed)
[facility] Provider Note    Event Date/Time   First MD Initiated Contact with Patient 03/15/23 2305     (approximate)   History   Hemoptysis   HPI  Todd Whitehead is a 78 y.o. male with history of CAD, COPD, CHF, hypertension who presents to the emergency department with shortness of breath, wheezing, hemoptysis,.  No known fever.  No chest pain.  He does not want oxygen chronically.  Was hypoxic on arrival.  Not on any blood thinners.   History provided by patient.    Past Medical History:  Diagnosis Date   Anxiety    Arthritis    CHF (congestive heart failure) (HCC)    Colon polyp    COPD (chronic obstructive pulmonary disease) (HCC)    smoker   Dysrhythmia    Glaucoma    Heart murmur    Hypertension    on  no meds   Low serum vitamin B12 11/18/2020   Melanoma (HCC)    Resected from Left upper arm.    Myocardial infarction [facility]) 2017   Peripheral vascular disease [facility])    Prostate enlargement     Past Surgical History:  Procedure Laterality Date   BACK SURGERY  01/19/98   CARDIAC CATHETERIZATION N/A 08/19/2015   Procedure: Left Heart Cath and Coronary Angiography;  Surgeon: Dalia Heading, MD;  Location: [facility] INVASIVE CV LAB;  Service: Cardiovascular;  Laterality: N/A;   CHOLECYSTECTOMY N/A 08/19/2016   Procedure: LAPAROSCOPIC CHOLECYSTECTOMY WITH INTRAOPERATIVE CHOLANGIOGRAM;  Surgeon: Almond Lint, MD;  Location: [facility] OR;  Service: General;  Laterality: N/A;   COLON SURGERY  age 7 days old   " bowel was twisted up"   COLONOSCOPY WITH PROPOFOL N/A 03/19/2016   Procedure: COLONOSCOPY WITH PROPOFOL;  Surgeon: Christena Deem, MD;  Location: [facility] ENDOSCOPY;  Service: Endoscopy;  Laterality: N/A;   CORONARY ARTERY BYPASS GRAFT  2017   ERCP N/A 08/18/2016   Procedure: ENDOSCOPIC RETROGRADE CHOLANGIOPANCREATOGRAPHY (ERCP);  Surgeon: Kerin Salen, MD;  Location: [facility] ENDOSCOPY;  Service: Gastroenterology;  Laterality: N/A;    ESOPHAGOGASTRODUODENOSCOPY (EGD) WITH PROPOFOL N/A 09/18/2018   Procedure: ESOPHAGOGASTRODUODENOSCOPY (EGD) WITH PROPOFOL;  Surgeon: Sung Amabile, DO;  Location: [facility] ENDOSCOPY;  Service: General;  Laterality: N/A;   EYE SURGERY Left 03/26/11   LOWER EXTREMITY ANGIOGRAPHY Left 10/03/2017   Procedure: LOWER EXTREMITY ANGIOGRAPHY;  Surgeon: Annice Needy, MD;  Location: [facility] INVASIVE CV LAB;  Service: Cardiovascular;  Laterality: Left;   LOWER EXTREMITY ANGIOGRAPHY Right 10/31/2017   Procedure: LOWER EXTREMITY ANGIOGRAPHY;  Surgeon: Annice Needy, MD;  Location: [facility] INVASIVE CV LAB;  Service: Cardiovascular;  Laterality: Right;   LUMBAR LAMINECTOMY/DECOMPRESSION MICRODISCECTOMY Right 10/02/2012   Procedure: Right Lumbar Five-Sacral One Lumbar laminotomy/Microdiskectomy;  Surgeon: Hewitt Shorts, MD;  Location: [facility] NEURO ORS;  Service: Neurosurgery;  Laterality: Right;  Right Lumbar Five-Sacral One Lumbar laminotomy/Microdiskectomy   VISCERAL ANGIOGRAPHY N/A 11/08/2020   Procedure: VISCERAL ANGIOGRAPHY;  Surgeon: Renford Dills, MD;  Location: [facility] INVASIVE CV LAB;  Service: Cardiovascular;  Laterality: N/A;   VISCERAL ANGIOGRAPHY N/A 11/25/2020   Procedure: VISCERAL ANGIOGRAPHY;  Surgeon: Renford Dills, MD;  Location: [facility] INVASIVE CV LAB;  Service: Cardiovascular;  Laterality: N/A;    MEDICATIONS:  Prior to Admission medications   Medication Sig Start Date End Date Taking? Authorizing Provider  acetaminophen (TYLENOL) 500 MG tablet Take 1 tablet (500 mg total) by mouth every 6 (six) hours as needed. 08/20/16   Efrain Sella, MD  albuterol (PROVENTIL) (2.5 MG/3ML) 0.083% nebulizer solution Take 3 mLs (2.5 mg total) by nebulization every 6 (six) hours as needed for wheezing or shortness of breath. 01/30/21   Enedina Finner, MD  ALPRAZolam Prudy Feeler) 0.5 MG tablet Take 0.5 mg by mouth at bedtime. 08/17/21   [provider]  aspirin EC 81 MG tablet Take 81 mg by mouth daily. Swallow whole.     [provider]  atorvastatin (LIPITOR) 40 MG tablet Take 40 mg by mouth daily.  05/22/17   [provider]  brimonidine (ALPHAGAN) 0.2 % ophthalmic solution Place 1 drop into both eyes 2 (two) times daily. 02/14/16   [provider]  busPIRone (BUSPAR) 30 MG tablet Take 30 mg by mouth 2 (two) times daily.    [provider]  citalopram (CELEXA) 20 MG tablet Take 20 mg by mouth daily. 10/28/19   [provider]  clopidogrel (PLAVIX) 75 MG tablet TAKE 1 TABLET(75 MG) BY MOUTH DAILY 11/07/21   Georgiana Spinner, NP  dapagliflozin propanediol (FARXIGA) 10 MG TABS tablet Take 1 tablet (10 mg total) by mouth daily before breakfast. 11/13/21   Delma Freeze, FNP  ferrous sulfate 325 (65 FE) MG EC tablet TAKE 1 TABLET(325 MG) BY MOUTH TWICE DAILY WITH A MEAL 11/05/22   Rickard Patience, MD  latanoprost (XALATAN) 0.005 % ophthalmic solution Place 1 drop into both eyes at bedtime.  06/14/17   [provider]  metoprolol succinate (TOPROL-XL) 25 MG 24 hr tablet Take 12.5 mg by mouth daily.    [provider]  Multiple Vitamin (MULTIVITAMIN WITH MINERALS) TABS tablet Take 1 tablet by mouth daily. 01/31/21   Enedina Finner, MD  QUEtiapine (SEROQUEL) 25 MG tablet Take 1 tablet (25 mg total) by mouth at bedtime. Patient taking differently: Take 25 mg by mouth 2 (two) times daily. 01/30/21   Enedina Finner, MD  sacubitril-valsartan (ENTRESTO) 49-51 MG Take 1 tablet by mouth 2 (two) times daily. 03/20/22   Delma Freeze, FNP  tamsulosin (FLOMAX) 0.4 MG CAPS capsule Take 1 capsule (0.4 mg total) by mouth daily after breakfast. 08/20/16   Efrain Sella, MD  traMADol (ULTRAM) 50 MG tablet Take 1 tablet (50 mg total) by mouth every 12 (twelve) hours as needed. 12/12/20   Leeroy Bock, MD  traZODone (DESYREL) 50 MG tablet Take 50 mg by mouth at bedtime. 06/02/19   [provider]    Physical Exam   Triage Vital Signs: ED Triage Vitals  Encounter Vitals Group      BP 03/15/23 1705 (!) 104/57     Systolic BP Percentile --      Diastolic BP Percentile --      Pulse Rate 03/15/23 1705 76     Resp 03/15/23 1705 18     Temp 03/15/23 1705 98.3 F (36.8 C)     Temp Source 03/15/23 1705 Oral     SpO2 03/15/23 1705 93 %     Weight 03/15/23 1706 115 lb (52.2 kg)     Height 03/15/23 1706 5\' 8"  (1.727 m)     Head Circumference --      Peak Flow --      Pain Score 03/15/23 1706 0     Pain Loc --      Pain Education --      Exclude from Growth Chart --     Most recent vital signs: Vitals:   03/16/23 0719 03/16/23 0756  BP: 113/67   Pulse: 62  Resp: 20   Temp:  97.9 F (36.6 C)  SpO2: 93%     CONSTITUTIONAL: Alert, responds appropriately to questions.  Thin, elderly, chronically ill-appearing HEAD: Normocephalic, atraumatic EYES: Conjunctivae clear, pupils appear equal, sclera nonicteric ENT: normal nose; moist mucous membranes NECK: Supple, normal ROM CARD: RRR; S1 and S2 appreciated RESP: Diffuse inspiratory and expiratory wheezing.  No rhonchi, rales.  Hypoxic on room air at rest.  No respiratory distress.  Speaking full sentences. ABD/GI: Non-distended; soft, non-tender, no rebound, no guarding, no peritoneal signs BACK: The back appears normal EXT: Normal ROM in all joints; no deformity noted, no edema, no calf tenderness or calf swelling SKIN: Normal color for age and race; warm; no rash on exposed skin NEURO: Moves all extremities equally, normal speech PSYCH: The patient's mood and manner are appropriate.   ED Results / Procedures / Treatments   LABS: (all labs ordered are listed, but only abnormal results are displayed) Labs Reviewed  CBC - Abnormal; Notable for the following components:      Result Value   RBC 3.38 (*)    Hemoglobin 10.4 (*)    HCT 30.3 (*)    All other components within normal limits  COMPREHENSIVE METABOLIC PANEL - Abnormal; Notable for the following components:   Sodium 130 (*)    Chloride 95 (*)     Glucose, Bld 211 (*)    BUN 27 (*)    Creatinine, Ser 1.25 (*)    GFR, Estimated 59 (*)    All other components within normal limits  CBG MONITORING, ED - Abnormal; Notable for the following components:   Glucose-Capillary 154 (*)    All other components within normal limits  RESP PANEL BY RT-PCR (RSV, FLU A&B, COVID)  [id number]  CULTURE, BLOOD (ROUTINE X 2)  CULTURE, BLOOD (ROUTINE X 2)  RESPIRATORY PANEL BY PCR  EXPECTORATED SPUTUM ASSESSMENT W GRAM STAIN, RFLX TO RESP C  PROTIME-INR  LACTIC ACID, PLASMA  LACTIC ACID, PLASMA  HEMOGLOBIN A1C  LEGIONELLA PNEUMOPHILA SEROGP 1 UR AG  STREP PNEUMONIAE URINARY ANTIGEN  TYPE AND SCREEN     EKG:   RADIOLOGY: My personal review and interpretation of imaging: CTA shows no PE but does show pneumonia.  I have personally reviewed all radiology reports.   CT Angio Chest Pulmonary Embolism (PE) W or WO Contrast Result Date: 03/15/2023 CLINICAL DATA:  Hemoptysis. EXAM: CT ANGIOGRAPHY CHEST WITH CONTRAST TECHNIQUE: Multidetector CT imaging of the chest was performed using the standard protocol during bolus administration of intravenous contrast. Multiplanar CT image reconstructions and MIPs were obtained to evaluate the vascular anatomy. RADIATION DOSE REDUCTION: This exam was performed according to the departmental dose-optimization program which includes automated exposure control, adjustment of the mA and/or kV according to patient size and/or use of iterative reconstruction technique. CONTRAST:  60mL OMNIPAQUE IOHEXOL 350 MG/ML SOLN COMPARISON:  Radiograph earlier today.  Chest CT 04/26/2021 FINDINGS: Cardiovascular: There are no filling defects within the pulmonary arteries to suggest pulmonary embolus. The heart is normal in size. There is contrast refluxing into the hepatic veins and IVC. Calcification of native coronary arteries, post median sternotomy. Aortic atherosclerosis without aneurysm. Celiac stent. Mediastinum/Nodes: Small mediastinal  and hilar lymph nodes, not enlarged by size criteria. Unremarkable appearance of the esophagus. No thyroid nodule. Lungs/Pleura: Patchy and consolidative left upper lobe opacity with areas of mucoid impaction and bronchial filling. Additional areas of mucoid impaction within the right middle and lower lobe. 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 Areas central bronchial thickening.  There is biapical pleuroparenchymal scarring. Bandlike atelectasis or no pleural fluid. Scarring in the right lower lobe. Upper Abdomen: Contrast refluxes into the hepatic veins and IVC. Detailed assessment is limited due to paucity of intra-abdominal fat. Musculoskeletal: Scoliosis and mild degenerative change in the spine. Prior median sternotomy. There are no acute or suspicious osseous abnormalities. Review of the MIP images confirms the above findings. IMPRESSION: 1. No pulmonary embolus. 2. Patchy and consolidative left upper lobe opacity with areas of mucoid impaction and bronchial filling, suspicious for pneumonia. Additional areas of mucoid impaction within the right middle and lower lobe. 3. Contrast refluxing into the hepatic veins and IVC, can be seen with right heart dysfunction. 4. 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 Aortic Atherosclerosis (ICD10-I70.0) and Emphysema (ICD10-J43.9). Electronically Signed   By: Narda Rutherford M.D.   On: 03/15/2023 19:47   DG Chest 2 View Result Date: 03/15/2023 CLINICAL DATA:  Hemoptysis. EXAM: CHEST - 2 VIEW COMPARISON:  Radiograph 08/03/2021 chest CT 04/26/2021 FINDINGS: Normal heart size. Post median sternotomy with epicardial wires in place. Chronic hyperinflation and emphysema. Ill-defined patchy left suprahilar opacity. No pneumothorax or pleural effusion. No acute osseous findings. IMPRESSION: 1. Patchy left suprahilar opacity suspicious for pneumonia. 2. Chronic hyperinflation and emphysema. Electronically Signed   By: Narda Rutherford M.D.   On: 03/15/2023  19:26     PROCEDURES:  Critical Care performed: Yes, see critical care procedure note(s)   CRITICAL CARE Performed by: Baxter Hire Quaniyah Bugh   Total critical care time: 40 minutes  Critical care time was exclusive of separately billable procedures and treating other patients.  Critical care was necessary to treat or prevent imminent or life-threatening deterioration.  Critical care was time spent personally by me on the following activities: development of treatment plan with patient and/or surrogate as well as nursing, discussions with consultants, evaluation of patient's response to treatment, examination of patient, obtaining history from patient or surrogate, ordering and performing treatments and interventions, ordering and review of laboratory studies, ordering and review of radiographic studies, pulse oximetry and re-evaluation of patient's condition.   Marland Kitchen1-3 Lead EKG Interpretation  Performed by: Sharda Keddy, Layla Maw, DO Authorized by: Cathleen Yagi, Layla Maw, DO     Interpretation: normal     ECG rate:  62   ECG rate assessment: normal     Rhythm: sinus rhythm     Ectopy: none     Conduction: normal       IMPRESSION / MDM / ASSESSMENT AND PLAN / ED COURSE  I reviewed the triage vital signs and the nursing notes.    Patient here with shortness of breath, wheezing, hemoptysis.  The patient is on the cardiac monitor to evaluate for evidence of arrhythmia and/or significant heart rate changes.   DIFFERENTIAL DIAGNOSIS (includes but not limited to):   Pneumonia, bronchitis, PE, ARDS, alveolar hemorrhage, COPD exacerbation, CHF   Patient's presentation is most consistent with acute presentation with potential threat to life or bodily function.   PLAN: Workup initiated from triage.  Hemoglobin of 10.4.  No leukocytosis.  Chronic kidney disease which is stable.  CTA of the chest obtained from triage and reviewed/interpreted by myself and the radiologist and shows multifocal pneumonia.   No PE.  There is sign of right heart dysfunction but no pulmonary edema.  He is wheezing here and has history of COPD.  Will give breathing treatments, Solu-Medrol.  Will give antibiotics for community-acquired pneumonia.  Given his oxygen requirement, he will require admission.  Patient  comfortable with this plan.   MEDICATIONS GIVEN IN ED: Medications  ipratropium-albuterol (DUONEB) 0.5-2.5 (3) MG/3ML nebulizer solution 3 mL (3 mLs Nebulization Not Given 03/16/23 0626)  insulin aspart (novoLOG) injection 0-9 Units (has no administration in time range)  insulin aspart (novoLOG) injection 0-5 Units (has no administration in time range)  acetaminophen (TYLENOL) tablet 650 mg (has no administration in time range)    Or  acetaminophen (TYLENOL) suppository 650 mg (has no administration in time range)  HYDROcodone-acetaminophen (NORCO/VICODIN) 5-325 MG per tablet 1-2 tablet (has no administration in time range)  ondansetron (ZOFRAN) tablet 4 mg (has no administration in time range)    Or  ondansetron (ZOFRAN) injection 4 mg (has no administration in time range)  guaiFENesin-dextromethorphan (ROBITUSSIN DM) 100-10 MG/5ML syrup 5 mL (has no administration in time range)  enoxaparin (LOVENOX) injection 40 mg (has no administration in time range)  cefTRIAXone (ROCEPHIN) 2 g in sodium chloride 0.9 % 100 mL IVPB (has no administration in time range)  azithromycin (ZITHROMAX) 500 mg in sodium chloride 0.9 % 250 mL IVPB (has no administration in time range)  guaiFENesin (MUCINEX) 12 hr tablet 600 mg (has no administration in time range)  albuterol (PROVENTIL) (2.5 MG/3ML) 0.083% nebulizer solution 2.5 mg (has no administration in time range)  iohexol (OMNIPAQUE) 350 MG/ML injection 75 mL (60 mLs Intravenous Contrast Given 03/15/23 1800)  cefTRIAXone (ROCEPHIN) 2 g in sodium chloride 0.9 % 100 mL IVPB (0 g Intravenous Stopped 03/16/23 0045)  azithromycin (ZITHROMAX) 500 mg in sodium chloride 0.9 % 250 mL IVPB  (0 mg Intravenous Stopped 03/16/23 0400)  lactated ringers bolus 1,000 mL (0 mLs Intravenous Stopped 03/16/23 0143)  fentaNYL (SUBLIMAZE) injection 50 mcg (50 mcg Intravenous Given 03/16/23 0026)  methylPREDNISolone sodium succinate (SOLU-MEDROL) 125 mg/2 mL injection 125 mg (125 mg Intravenous Given 03/16/23 0030)     ED COURSE:  Consulted and discussed patient's case with hospitalist, Dr. Para March.  I have recommended admission and consulting physician agrees and will place admission orders.  Patient (and family if present) agree with this plan.   I reviewed all nursing notes, vitals, pertinent previous records.  All labs, EKGs, imaging ordered have been independently reviewed and interpreted by myself.       OUTSIDE RECORDS REVIEWED: Reviewed last cardiology note in December 2024.       FINAL CLINICAL IMPRESSION(S) / ED DIAGNOSES   Final diagnoses:  Multifocal pneumonia  Acute respiratory failure with hypoxia (HCC)  Hemoptysis  COPD with acute exacerbation (HCC)     Rx / DC Orders   ED Discharge Orders     None        Note:  This document was prepared using Dragon voice recognition software and may include unintentional dictation errors.   Orie Cuttino, Layla Maw, DO 03/16/23 [phone]

## 2023-03-16 DIAGNOSIS — Z1152 Encounter for screening for COVID-19: Secondary | ICD-10-CM | POA: Diagnosis not present

## 2023-03-16 DIAGNOSIS — R042 Hemoptysis: Secondary | ICD-10-CM | POA: Diagnosis present

## 2023-03-16 DIAGNOSIS — E1151 Type 2 diabetes mellitus with diabetic peripheral angiopathy without gangrene: Secondary | ICD-10-CM | POA: Diagnosis present

## 2023-03-16 DIAGNOSIS — E1122 Type 2 diabetes mellitus with diabetic chronic kidney disease: Secondary | ICD-10-CM | POA: Diagnosis present

## 2023-03-16 DIAGNOSIS — J432 Centrilobular emphysema: Secondary | ICD-10-CM | POA: Diagnosis present

## 2023-03-16 DIAGNOSIS — I25118 Atherosclerotic heart disease of native coronary artery with other forms of angina pectoris: Secondary | ICD-10-CM

## 2023-03-16 DIAGNOSIS — J9621 Acute and chronic respiratory failure with hypoxia: Secondary | ICD-10-CM | POA: Diagnosis present

## 2023-03-16 DIAGNOSIS — D472 Monoclonal gammopathy: Secondary | ICD-10-CM | POA: Diagnosis present

## 2023-03-16 DIAGNOSIS — F418 Other specified anxiety disorders: Secondary | ICD-10-CM | POA: Diagnosis not present

## 2023-03-16 DIAGNOSIS — J438 Other emphysema: Secondary | ICD-10-CM | POA: Diagnosis present

## 2023-03-16 DIAGNOSIS — J441 Chronic obstructive pulmonary disease with (acute) exacerbation: Secondary | ICD-10-CM | POA: Diagnosis present

## 2023-03-16 DIAGNOSIS — Q2112 Patent foramen ovale: Secondary | ICD-10-CM | POA: Diagnosis not present

## 2023-03-16 DIAGNOSIS — J189 Pneumonia, unspecified organism: Secondary | ICD-10-CM | POA: Insufficient documentation

## 2023-03-16 DIAGNOSIS — E871 Hypo-osmolality and hyponatremia: Secondary | ICD-10-CM | POA: Diagnosis present

## 2023-03-16 DIAGNOSIS — D631 Anemia in chronic kidney disease: Secondary | ICD-10-CM | POA: Diagnosis present

## 2023-03-16 DIAGNOSIS — R0489 Hemorrhage from other sites in respiratory passages: Secondary | ICD-10-CM | POA: Diagnosis present

## 2023-03-16 DIAGNOSIS — Z66 Do not resuscitate: Secondary | ICD-10-CM | POA: Diagnosis present

## 2023-03-16 DIAGNOSIS — J440 Chronic obstructive pulmonary disease with acute lower respiratory infection: Secondary | ICD-10-CM | POA: Diagnosis present

## 2023-03-16 DIAGNOSIS — N183 Chronic kidney disease, stage 3 unspecified: Secondary | ICD-10-CM | POA: Diagnosis present

## 2023-03-16 DIAGNOSIS — J188 Other pneumonia, unspecified organism: Secondary | ICD-10-CM | POA: Insufficient documentation

## 2023-03-16 DIAGNOSIS — J9601 Acute respiratory failure with hypoxia: Secondary | ICD-10-CM | POA: Diagnosis not present

## 2023-03-16 DIAGNOSIS — Z681 Body mass index (BMI) 19 or less, adult: Secondary | ICD-10-CM | POA: Diagnosis not present

## 2023-03-16 DIAGNOSIS — N1831 Chronic kidney disease, stage 3a: Secondary | ICD-10-CM | POA: Diagnosis not present

## 2023-03-16 DIAGNOSIS — I130 Hypertensive heart and chronic kidney disease with heart failure and stage 1 through stage 4 chronic kidney disease, or unspecified chronic kidney disease: Secondary | ICD-10-CM | POA: Diagnosis present

## 2023-03-16 DIAGNOSIS — R64 Cachexia: Secondary | ICD-10-CM | POA: Diagnosis present

## 2023-03-16 DIAGNOSIS — Z7189 Other specified counseling: Secondary | ICD-10-CM | POA: Diagnosis not present

## 2023-03-16 DIAGNOSIS — I10 Essential (primary) hypertension: Secondary | ICD-10-CM | POA: Diagnosis not present

## 2023-03-16 DIAGNOSIS — I5022 Chronic systolic (congestive) heart failure: Secondary | ICD-10-CM | POA: Diagnosis present

## 2023-03-16 DIAGNOSIS — D649 Anemia, unspecified: Secondary | ICD-10-CM | POA: Diagnosis not present

## 2023-03-16 DIAGNOSIS — Z515 Encounter for palliative care: Secondary | ICD-10-CM | POA: Diagnosis not present

## 2023-03-16 DIAGNOSIS — E1165 Type 2 diabetes mellitus with hyperglycemia: Secondary | ICD-10-CM | POA: Diagnosis present

## 2023-03-16 DIAGNOSIS — F32A Depression, unspecified: Secondary | ICD-10-CM | POA: Diagnosis present

## 2023-03-16 LAB — CBG MONITORING, ED
Glucose-Capillary: 154 mg/dL — ABNORMAL HIGH (ref 70–99)
Glucose-Capillary: 171 mg/dL — ABNORMAL HIGH (ref 70–99)

## 2023-03-16 LAB — TYPE AND SCREEN
ABO/RH(D): O POS
Antibody Screen: NEGATIVE

## 2023-03-16 LAB — HEMOGLOBIN A1C
Hgb A1c MFr Bld: 6 % — ABNORMAL HIGH (ref 4.8–5.6)
Mean Plasma Glucose: 125.5 mg/dL

## 2023-03-16 LAB — PROTIME-INR
INR: 1 (ref 0.8–1.2)
Prothrombin Time: 13.6 s (ref 11.4–15.2)

## 2023-03-16 LAB — GLUCOSE, CAPILLARY
Glucose-Capillary: 178 mg/dL — ABNORMAL HIGH (ref 70–99)
Glucose-Capillary: 200 mg/dL — ABNORMAL HIGH (ref 70–99)

## 2023-03-16 LAB — LACTIC ACID, PLASMA
Lactic Acid, Venous: 0.9 mmol/L (ref 0.5–1.9)
Lactic Acid, Venous: 0.8 mmol/L (ref 0.5–1.9)

## 2023-03-16 MED ORDER — QUETIAPINE FUMARATE 25 MG PO TABS
50.0000 mg | ORAL_TABLET | Freq: Every day | ORAL | Status: DC
  Administered 2023-03-16 – 2023-03-18 (×3): 50 mg via ORAL
  Filled 2023-03-16 (×3): qty 2

## 2023-03-16 MED ORDER — ALBUTEROL SULFATE (2.5 MG/3ML) 0.083% IN NEBU
2.5000 mg | INHALATION_SOLUTION | RESPIRATORY_TRACT | Status: DC | PRN
  Administered 2023-03-16: 2.5 mg via RESPIRATORY_TRACT
  Filled 2023-03-16: qty 3

## 2023-03-16 MED ORDER — SODIUM CHLORIDE 0.9 % IV SOLN
500.0000 mg | INTRAVENOUS | Status: DC
  Administered 2023-03-17 – 2023-03-18 (×2): 500 mg via INTRAVENOUS
  Filled 2023-03-16 (×3): qty 5

## 2023-03-16 MED ORDER — BRIMONIDINE TARTRATE 0.15 % OP SOLN
1.0000 [drp] | Freq: Two times a day (BID) | OPHTHALMIC | Status: DC
  Administered 2023-03-16 – 2023-03-19 (×6): 1 [drp] via OPHTHALMIC
  Filled 2023-03-16 (×2): qty 5

## 2023-03-16 MED ORDER — HYDROCODONE-ACETAMINOPHEN 5-325 MG PO TABS: 1.0000 | ORAL_TABLET | ORAL | Status: DC | PRN

## 2023-03-16 MED ORDER — TRAMADOL HCL 50 MG PO TABS: 25.0000 mg | ORAL_TABLET | Freq: Three times a day (TID) | ORAL | Status: DC | PRN

## 2023-03-16 MED ORDER — ONDANSETRON HCL 4 MG/2ML IJ SOLN
4.0000 mg | Freq: Four times a day (QID) | INTRAMUSCULAR | Status: DC | PRN
Start: 2023-03-16 — End: 2023-03-19

## 2023-03-16 MED ORDER — SODIUM CHLORIDE 0.9 % IV SOLN
2.0000 g | INTRAVENOUS | Status: DC
  Administered 2023-03-17 – 2023-03-18 (×3): 2 g via INTRAVENOUS
  Filled 2023-03-16 (×3): qty 20

## 2023-03-16 MED ORDER — LATANOPROST 0.005 % OP SOLN
1.0000 [drp] | Freq: Every day | OPHTHALMIC | Status: DC
Start: 2023-03-16 — End: 2023-03-19
  Administered 2023-03-16 – 2023-03-18 (×3): 1 [drp] via OPHTHALMIC
  Filled 2023-03-16 (×2): qty 2.5

## 2023-03-16 MED ORDER — CITALOPRAM HYDROBROMIDE 20 MG PO TABS
20.0000 mg | ORAL_TABLET | Freq: Every day | ORAL | Status: DC
  Administered 2023-03-16 – 2023-03-19 (×3): 20 mg via ORAL
  Filled 2023-03-16 (×4): qty 1

## 2023-03-16 MED ORDER — ENOXAPARIN SODIUM 40 MG/0.4ML IJ SOSY: 40.0000 mg | PREFILLED_SYRINGE | INTRAMUSCULAR | Status: DC

## 2023-03-16 MED ORDER — GUAIFENESIN ER 600 MG PO TB12
600.0000 mg | ORAL_TABLET | Freq: Two times a day (BID) | ORAL | Status: DC
  Administered 2023-03-16 – 2023-03-19 (×6): 600 mg via ORAL
  Filled 2023-03-16 (×7): qty 1

## 2023-03-16 MED ORDER — GUAIFENESIN-DM 100-10 MG/5ML PO SYRP
5.0000 mL | ORAL_SOLUTION | ORAL | Status: DC | PRN
  Administered 2023-03-16: 5 mL via ORAL
  Filled 2023-03-16: qty 10

## 2023-03-16 MED ORDER — METOPROLOL SUCCINATE ER 25 MG PO TB24
12.5000 mg | ORAL_TABLET | Freq: Every day | ORAL | Status: DC
  Administered 2023-03-16 – 2023-03-19 (×3): 12.5 mg via ORAL
  Filled 2023-03-16 (×4): qty 1

## 2023-03-16 MED ORDER — ONDANSETRON HCL 4 MG PO TABS
4.0000 mg | ORAL_TABLET | Freq: Four times a day (QID) | ORAL | Status: DC | PRN
Start: 2023-03-16 — End: 2023-03-19

## 2023-03-16 MED ORDER — ACETAMINOPHEN 325 MG PO TABS
650.0000 mg | ORAL_TABLET | Freq: Four times a day (QID) | ORAL | Status: DC | PRN
  Administered 2023-03-16: 650 mg via ORAL
  Filled 2023-03-16 (×2): qty 2

## 2023-03-16 MED ORDER — TAMSULOSIN HCL 0.4 MG PO CAPS
0.4000 mg | ORAL_CAPSULE | Freq: Every day | ORAL | Status: DC
  Administered 2023-03-17 – 2023-03-19 (×2): 0.4 mg via ORAL
  Filled 2023-03-16 (×3): qty 1

## 2023-03-16 MED ORDER — INSULIN ASPART 100 UNIT/ML IJ SOLN: 0.0000 [IU] | Freq: Every day | INTRAMUSCULAR | Status: DC

## 2023-03-16 MED ORDER — PREDNISONE 20 MG PO TABS: 40.0000 mg | ORAL_TABLET | Freq: Every day | ORAL | Status: DC

## 2023-03-16 MED ORDER — ATORVASTATIN CALCIUM 20 MG PO TABS
40.0000 mg | ORAL_TABLET | Freq: Every day | ORAL | Status: DC
  Administered 2023-03-16 – 2023-03-19 (×3): 40 mg via ORAL
  Filled 2023-03-16 (×5): qty 2

## 2023-03-16 MED ORDER — PREDNISONE 20 MG PO TABS
40.0000 mg | ORAL_TABLET | Freq: Every day | ORAL | Status: DC
  Administered 2023-03-16 – 2023-03-19 (×3): 40 mg via ORAL
  Filled 2023-03-16 (×4): qty 2

## 2023-03-16 MED ORDER — SACUBITRIL-VALSARTAN 49-51 MG PO TABS
1.0000 | ORAL_TABLET | Freq: Two times a day (BID) | ORAL | Status: DC
  Administered 2023-03-16 – 2023-03-19 (×5): 1 via ORAL
  Filled 2023-03-16 (×8): qty 1

## 2023-03-16 MED ORDER — ORAL CARE MOUTH RINSE: 15.0000 mL | OROMUCOSAL | Status: DC | PRN

## 2023-03-16 MED ORDER — BUSPIRONE HCL 15 MG PO TABS
30.0000 mg | ORAL_TABLET | Freq: Two times a day (BID) | ORAL | Status: DC
  Administered 2023-03-16 – 2023-03-19 (×5): 30 mg via ORAL
  Filled 2023-03-16 (×7): qty 2

## 2023-03-16 MED ORDER — INSULIN ASPART 100 UNIT/ML IJ SOLN
0.0000 [IU] | Freq: Three times a day (TID) | INTRAMUSCULAR | Status: DC
  Administered 2023-03-16 – 2023-03-17 (×4): 2 [IU] via SUBCUTANEOUS
  Filled 2023-03-16 (×4): qty 1

## 2023-03-16 MED ORDER — DAPAGLIFLOZIN PROPANEDIOL 10 MG PO TABS: 10.0000 mg | ORAL_TABLET | Freq: Every day | ORAL | Status: DC

## 2023-03-16 MED ORDER — IPRATROPIUM-ALBUTEROL 0.5-2.5 (3) MG/3ML IN SOLN
3.0000 mL | Freq: Three times a day (TID) | RESPIRATORY_TRACT | Status: DC
  Administered 2023-03-16 – 2023-03-17 (×3): 3 mL via RESPIRATORY_TRACT
  Filled 2023-03-16 (×3): qty 3

## 2023-03-16 MED ORDER — ACETAMINOPHEN 650 MG RE SUPP: 650.0000 mg | Freq: Four times a day (QID) | RECTAL | Status: DC | PRN

## 2023-03-16 MED ORDER — NICOTINE 21 MG/24HR TD PT24
21.0000 mg | MEDICATED_PATCH | Freq: Every day | TRANSDERMAL | Status: DC
  Administered 2023-03-16 – 2023-03-19 (×4): 21 mg via TRANSDERMAL
  Filled 2023-03-16 (×4): qty 1

## 2023-03-16 NOTE — ED Notes (Signed)
This tech assisted pt with using the urinal with minimal assistance. Pt is now resting in bed comfortably.

## 2023-03-16 NOTE — ED Notes (Signed)
This tech assisted pt to restroom.

## 2023-03-16 NOTE — Sepsis Progress Note (Signed)
Elink monitoring for the code sepsis protocol.   Notified provider of need to order lactic acid.

## 2023-03-16 NOTE — ED Notes (Addendum)
This NT assisted pt with using the urinal. Pt is back in bed resting comfortably.

## 2023-03-16 NOTE — Consult Note (Signed)
CODE SEPSIS - PHARMACY COMMUNICATION  **Broad Spectrum Antibiotics should be administered within 1 hour of Sepsis diagnosis**  Time Code Sepsis Called/Page Received: 2324  Antibiotics Ordered: azithromycin, ceftriaxone  Time of 1st antibiotic administration: 0027  Additional action taken by pharmacy: na  If necessary, Name of Provider/Nurse Contacted: na    Bettey Costa ,PharmD Clinical Pharmacist  03/16/2023  12:33 AM

## 2023-03-16 NOTE — Plan of Care (Signed)

## 2023-03-16 NOTE — ED Notes (Signed)
Pt declined gown.

## 2023-03-16 NOTE — Progress Notes (Signed)
Rounded to bring HCPOA paperwork. Pt was with staff getting acquainted to unit asked to come back-will attempt to round back before end of night.

## 2023-03-16 NOTE — Progress Notes (Signed)
Approximately 1712-- Pt arrived to room 212A from ED. Upon arrival, pt A&O x 4. VS obtained and full assessment completed. Pt oriented to room and unit. All fall precautions in place.

## 2023-03-16 NOTE — H&P (Addendum)
History and Physical    Patient: Todd Whitehead FAO:[id number] DOB: 03-Jan-1946 DOA: 03/15/2023 DOS: the patient was seen and examined on 03/16/2023 PCP: Lauro Regulus, MD  Patient coming from: Home  Chief Complaint:  Chief Complaint  Patient presents with   Hemoptysis   HPI: Todd Whitehead is a 78 y.o. male with medical history significant of  hypertension, hyperlipidemia, COPD not on home oxygen, depression with anxiety, BPH, PVD, CAD, CABG, CHF with EF of 45-50% in 12/2020, CKD-3, type II diabetes mellitus, tobacco use disorder, anemia, hx of melanoma, MGUS, and carotid artery stenosis. He presents to [facility] ED with reports of dyspnea, wheezing, and hemoptysis that started two days ago. No known sick contacts. Patient denies fever, chest pain, or abdominal pain.   ED Course: On arrival to [facility] regional ED patient was noted to be afebrile temp 36.8 C, BP 104/57, HR 76, RR 18, SpO2 93% on room air.  SpO2 decreased to 86% and patient placed on 2 L of supplemental O2 via nasal cannula.  Labs notable for hemoglobin 10.4, patient with history of chronic normocytic anemia however hemoglobin was 11.4 several months ago this represents a 1 point interval drop.  Sodium 130, blood glucose elevated at 211, BUN 27, creatinine 1.25, COVID flu RSV negative, no lactic acidosis.  CXR obtained and shows patchy left suprahilar opacities suspicious for pneumonia with chronic hyperinflation and emphysema. CT angio PE obtained and shows mucoid impaction within the right middle lower lobe, and the same patchy opacities seen on CXR.  He was given Rocephin, Zithromax, 1 L LR bolus, 125 mg of Solu-Medrol.  TRH contacted for admission.  Review of Systems: As mentioned in the history of present illness. All other systems reviewed and are negative. Past Medical History:  Diagnosis Date   Anxiety    Arthritis    CHF (congestive heart failure) (HCC)    Colon polyp    COPD (chronic obstructive  pulmonary disease) (HCC)    smoker   Dysrhythmia    Glaucoma    Heart murmur    Hypertension    on  no meds   Low serum vitamin B12 11/18/2020   Melanoma (HCC)    Resected from Left upper arm.    Myocardial infarction [facility]) 2017   Peripheral vascular disease [facility])    Prostate enlargement    Past Surgical History:  Procedure Laterality Date   BACK SURGERY  01/19/98   CARDIAC CATHETERIZATION N/A 08/19/2015   Procedure: Left Heart Cath and Coronary Angiography;  Surgeon: Dalia Heading, MD;  Location: [facility] INVASIVE CV LAB;  Service: Cardiovascular;  Laterality: N/A;   CHOLECYSTECTOMY N/A 08/19/2016   Procedure: LAPAROSCOPIC CHOLECYSTECTOMY WITH INTRAOPERATIVE CHOLANGIOGRAM;  Surgeon: Almond Lint, MD;  Location: [facility] OR;  Service: General;  Laterality: N/A;   COLON SURGERY  age 83 days old   " bowel was twisted up"   COLONOSCOPY WITH PROPOFOL N/A 03/19/2016   Procedure: COLONOSCOPY WITH PROPOFOL;  Surgeon: Christena Deem, MD;  Location: [facility] ENDOSCOPY;  Service: Endoscopy;  Laterality: N/A;   CORONARY ARTERY BYPASS GRAFT  2017   ERCP N/A 08/18/2016   Procedure: ENDOSCOPIC RETROGRADE CHOLANGIOPANCREATOGRAPHY (ERCP);  Surgeon: Kerin Salen, MD;  Location: [facility] ENDOSCOPY;  Service: Gastroenterology;  Laterality: N/A;   ESOPHAGOGASTRODUODENOSCOPY (EGD) WITH PROPOFOL N/A 09/18/2018   Procedure: ESOPHAGOGASTRODUODENOSCOPY (EGD) WITH PROPOFOL;  Surgeon: Sung Amabile, DO;  Location: [facility] ENDOSCOPY;  Service: General;  Laterality: N/A;   EYE SURGERY Left 03/26/11   LOWER EXTREMITY ANGIOGRAPHY  Left 10/03/2017   Procedure: LOWER EXTREMITY ANGIOGRAPHY;  Surgeon: Annice Needy, MD;  Location: [facility] INVASIVE CV LAB;  Service: Cardiovascular;  Laterality: Left;   LOWER EXTREMITY ANGIOGRAPHY Right 10/31/2017   Procedure: LOWER EXTREMITY ANGIOGRAPHY;  Surgeon: Annice Needy, MD;  Location: [facility] INVASIVE CV LAB;  Service: Cardiovascular;  Laterality: Right;   LUMBAR LAMINECTOMY/DECOMPRESSION MICRODISCECTOMY Right  10/02/2012   Procedure: Right Lumbar Five-Sacral One Lumbar laminotomy/Microdiskectomy;  Surgeon: Hewitt Shorts, MD;  Location: [facility] NEURO ORS;  Service: Neurosurgery;  Laterality: Right;  Right Lumbar Five-Sacral One Lumbar laminotomy/Microdiskectomy   VISCERAL ANGIOGRAPHY N/A 11/08/2020   Procedure: VISCERAL ANGIOGRAPHY;  Surgeon: Renford Dills, MD;  Location: [facility] INVASIVE CV LAB;  Service: Cardiovascular;  Laterality: N/A;   VISCERAL ANGIOGRAPHY N/A 11/25/2020   Procedure: VISCERAL ANGIOGRAPHY;  Surgeon: Renford Dills, MD;  Location: [facility] INVASIVE CV LAB;  Service: Cardiovascular;  Laterality: N/A;   Social History:  reports that he has been smoking cigarettes. He has a 58 pack-year smoking history. He has never used smokeless tobacco. He reports that he does not drink alcohol and does not use drugs.  Allergies  Allergen Reactions   Other Nausea And Vomiting    Cheese,butter,sour cream,cottage cheese   Poison Ivy Extract Rash   Poison Oak Extract Rash    Family History  Problem Relation Age of Onset   Heart attack Father    Aneurysm Brother    Lung cancer Brother     Prior to Admission medications   Medication Sig Start Date End Date Taking? Authorizing Provider  ALPRAZolam Prudy Feeler) 0.25 MG tablet Take by mouth at bedtime as needed for sleep. 12/11/22 06/09/23 Yes [provider]  aspirin EC 81 MG tablet Take 81 mg by mouth daily. Swallow whole.   Yes [provider]  atorvastatin (LIPITOR) 40 MG tablet Take 40 mg by mouth daily.  05/22/17  Yes [provider]  brimonidine (ALPHAGAN) 0.15 % ophthalmic solution Place 1 drop into both eyes 2 (two) times daily. 02/21/22  Yes [provider]  busPIRone (BUSPAR) 30 MG tablet Take 30 mg by mouth 2 (two) times daily.   Yes [provider]  citalopram (CELEXA) 20 MG tablet Take 20 mg by mouth daily. 10/28/19  Yes [provider]  clopidogrel (PLAVIX) 75 MG tablet TAKE 1 TABLET(75  MG) BY MOUTH DAILY 11/07/21  Yes Georgiana Spinner, NP  ferrous sulfate 325 (65 FE) MG EC tablet TAKE 1 TABLET(325 MG) BY MOUTH TWICE DAILY WITH A MEAL 11/05/22  Yes Rickard Patience, MD  latanoprost (XALATAN) 0.005 % ophthalmic solution Place 1 drop into both eyes at bedtime.  06/14/17  Yes [provider]  metoprolol succinate (TOPROL-XL) 25 MG 24 hr tablet Take 12.5 mg by mouth daily.   Yes [provider]  QUEtiapine (SEROQUEL) 50 MG tablet Take 1 tablet by mouth at bedtime. 11/19/22 11/19/23 Yes [provider]  traMADol (ULTRAM) 50 MG tablet Take 1 tablet (50 mg total) by mouth every 12 (twelve) hours as needed. Patient taking differently: Take 25-50 mg by mouth 3 (three) times daily as needed. 12/12/20  Yes Leeroy Bock, MD  acetaminophen (TYLENOL) 500 MG tablet Take 1 tablet (500 mg total) by mouth every 6 (six) hours as needed. 08/20/16   Efrain Sella, MD  albuterol (PROVENTIL) (2.5 MG/3ML) 0.083% nebulizer solution Take 3 mLs (2.5 mg total) by nebulization every 6 (six) hours as needed for wheezing or shortness of breath. 01/30/21   Enedina Finner,  MD  dapagliflozin propanediol (FARXIGA) 10 MG TABS tablet Take 1 tablet (10 mg total) by mouth daily before breakfast. 11/13/21   Delma Freeze, FNP  Multiple Vitamin (MULTIVITAMIN WITH MINERALS) TABS tablet Take 1 tablet by mouth daily. 01/31/21   Enedina Finner, MD  QUEtiapine (SEROQUEL) 25 MG tablet Take 1 tablet (25 mg total) by mouth at bedtime. Patient not taking: Reported on 03/16/2023 01/30/21   Enedina Finner, MD  sacubitril-valsartan (ENTRESTO) 49-51 MG Take 1 tablet by mouth 2 (two) times daily. 03/20/22   Delma Freeze, FNP  tamsulosin (FLOMAX) 0.4 MG CAPS capsule Take 1 capsule (0.4 mg total) by mouth daily after breakfast. 08/20/16   Efrain Sella, MD  traZODone (DESYREL) 50 MG tablet Take 50 mg by mouth at bedtime. 06/02/19   [provider]    Physical Exam: Vitals:   03/16/23 0100 03/16/23 0330 03/16/23 0400  03/16/23 0400  BP: (!) 141/59 (!) 125/53 (!) 137/58 (!) 137/58  Pulse: 64 60 60 61  Resp: 20 20 20    Temp:      TempSrc:      SpO2: 100% 93% 96% 94%  Weight:      Height:        Constitutional: Cachetic elderly caucasian gentleman Eyes: PERRL, lids and conjunctivae normal ENMT: Mucous membranes are moist. Posterior pharynx clear of any exudate or lesions. Respiratory: Wheezing heard throughout the lung fields, no crackles. Increased respiratory effort. Tachypneic  Cardiovascular: Regular rate and rhythm, no murmurs / rubs / gallops. No extremity edema. 1+ radial and pedal pulses.  Abdomen: no tenderness, no masses palpated. No hepatosplenomegaly. Bowel sounds positive x 4 quadrants.  Musculoskeletal: No joint deformity upper and lower extremities. Good ROM, no contractures.  Skin: no rashes, lesions, ulcers.  Neurologic: CN 2-12 grossly intact. Alert and oriented x 3. Normal mood.   Data Reviewed: CBC    Component Value Date/Time   WBC 8.1 03/15/2023 1708   RBC 3.38 (L) 03/15/2023 1708   HGB 10.4 (L) 03/15/2023 1708   HGB 11.4 (L) 07/06/2020 1505   HCT 30.3 (L) 03/15/2023 1708   HCT 33.3 (L) 07/06/2020 1505   PLT 178 03/15/2023 1708   PLT 165 07/06/2020 1505   MCV 89.6 03/15/2023 1708   MCV 90 07/06/2020 1505   MCH 30.8 03/15/2023 1708   MCHC 34.3 03/15/2023 1708   RDW 14.1 03/15/2023 1708   RDW 14.5 07/06/2020 1505   LYMPHSABS 1.4 08/17/2022 1245   MONOABS 0.6 08/17/2022 1245   EOSABS 0.2 08/17/2022 1245   BASOSABS 0.0 08/17/2022 1245   CMP     Component Value Date/Time   NA 130 (L) 03/15/2023 1708   NA 138 07/06/2020 1505   K 4.0 03/15/2023 1708   CL 95 (L) 03/15/2023 1708   CO2 22 03/15/2023 1708   GLUCOSE 211 (H) 03/15/2023 1708   BUN 27 (H) 03/15/2023 1708   BUN 18 07/06/2020 1505   CREATININE 1.25 (H) 03/15/2023 1708   CALCIUM 9.1 03/15/2023 1708   PROT 6.8 03/15/2023 1708   PROT 6.3 07/06/2020 1505   ALBUMIN 3.9 03/15/2023 1708   ALBUMIN 4.1  07/06/2020 1505   AST 33 03/15/2023 1708   ALT 19 03/15/2023 1708   ALKPHOS 73 03/15/2023 1708   BILITOT 0.8 03/15/2023 1708   BILITOT 0.3 07/06/2020 1505   EGFR 74 07/06/2020 1505   GFRNONAA 59 (L) 03/15/2023 1708   Lactic Acid, Venous    Component Value Date/Time   LATICACIDVEN 0.8 03/16/2023 0442  Lab Results  Component Value Date   HGBA1C 6.0 (H) 03/16/2023   HGBA1C 5.4 01/26/2021   Results for orders placed or performed during the hospital encounter of 03/15/23  Resp panel by RT-PCR (RSV, Flu A&B, Covid) Anterior Nasal Swab     Status: None   Collection Time: 03/15/23  5:16 PM   Specimen: Anterior Nasal Swab  Result Value Ref Range Status   SARS Coronavirus 2 by RT PCR NEGATIVE NEGATIVE Final    Comment: (NOTE) SARS-CoV-2 target nucleic acids are NOT DETECTED.  The SARS-CoV-2 RNA is generally detectable in upper respiratory specimens during the acute phase of infection. The lowest concentration of SARS-CoV-2 viral copies this assay can detect is 138 copies/mL. A negative result does not preclude SARS-Cov-2 infection and should not be used as the sole basis for treatment or other patient management decisions. A negative result may occur with  improper specimen collection/handling, submission of specimen other than nasopharyngeal swab, presence of viral mutation(s) within the areas targeted by this assay, and inadequate number of viral copies(<138 copies/mL). A negative result must be combined with clinical observations, patient history, and epidemiological information. The expected result is Negative.  Fact Sheet for Patients:  BloggerCourse.com  Fact Sheet for Healthcare Providers:  SeriousBroker.it  This test is no t yet approved or cleared by the [place] FDA and  has been authorized for detection and/or diagnosis of SARS-CoV-2 by FDA under an Emergency Use Authorization (EUA). This EUA will remain  in  effect (meaning this test can be used) for the duration of the COVID-19 declaration under Section 564(b)(1) of the Act, 21 U.S.C.section 360bbb-3(b)(1), unless the authorization is terminated  or revoked sooner.       Influenza A by PCR NEGATIVE NEGATIVE Final   Influenza B by PCR NEGATIVE NEGATIVE Final    Comment: (NOTE) The Xpert Xpress SARS-CoV-2/FLU/RSV plus assay is intended as an aid in the diagnosis of influenza from Nasopharyngeal swab specimens and should not be used as a sole basis for treatment. Nasal washings and aspirates are unacceptable for Xpert Xpress SARS-CoV-2/FLU/RSV testing.  Fact Sheet for Patients: BloggerCourse.com  Fact Sheet for Healthcare Providers: SeriousBroker.it  This test is not yet approved or cleared by the [place] FDA and has been authorized for detection and/or diagnosis of SARS-CoV-2 by FDA under an Emergency Use Authorization (EUA). This EUA will remain in effect (meaning this test can be used) for the duration of the COVID-19 declaration under Section 564(b)(1) of the Act, 21 U.S.C. section 360bbb-3(b)(1), unless the authorization is terminated or revoked.     Resp Syncytial Virus by PCR NEGATIVE NEGATIVE Final    Comment: (NOTE) Fact Sheet for Patients: BloggerCourse.com  Fact Sheet for Healthcare Providers: SeriousBroker.it  This test is not yet approved or cleared by the [place] FDA and has been authorized for detection and/or diagnosis of SARS-CoV-2 by FDA under an Emergency Use Authorization (EUA). This EUA will remain in effect (meaning this test can be used) for the duration of the COVID-19 declaration under Section 564(b)(1) of the Act, 21 U.S.C. section 360bbb-3(b)(1), unless the authorization is terminated or revoked.  Performed at [facility], [address] [place], [place] [address]   Blood  culture (routine x 2)     Status: None (Preliminary result)   Collection Time: 03/16/23 12:15 AM   Specimen: BLOOD LEFT FOREARM  Result Value Ref Range Status   Specimen Description BLOOD LEFT FOREARM  Final   Special Requests   Final  BOTTLES DRAWN AEROBIC AND ANAEROBIC Blood Culture adequate volume   Culture   Final    NO GROWTH < 12 HOURS Performed at [facility], [address] [place], [place] [address]    Report Status PENDING  Incomplete  Blood culture (routine x 2)     Status: None (Preliminary result)   Collection Time: 03/16/23 12:15 AM   Specimen: BLOOD LEFT HAND  Result Value Ref Range Status   Specimen Description BLOOD LEFT HAND  Final   Special Requests   Final    BOTTLES DRAWN AEROBIC AND ANAEROBIC Blood Culture adequate volume   Culture   Final    NO GROWTH < 12 HOURS Performed at [facility], [address] [place], [place] [address]    Report Status PENDING  Incomplete   CT Angio Chest Pulmonary Embolism (PE) W or WO Contrast Result Date: 03/15/2023 CLINICAL DATA:  Hemoptysis. EXAM: CT ANGIOGRAPHY CHEST WITH CONTRAST TECHNIQUE: Multidetector CT imaging of the chest was performed using the standard protocol during bolus administration of intravenous contrast. Multiplanar CT image reconstructions and MIPs were obtained to evaluate the vascular anatomy. RADIATION DOSE REDUCTION: This exam was performed according to the departmental dose-optimization program which includes automated exposure control, adjustment of the mA and/or kV according to patient size and/or use of iterative reconstruction technique. CONTRAST:  60mL OMNIPAQUE IOHEXOL 350 MG/ML SOLN COMPARISON:  Radiograph earlier today.  Chest CT 04/26/2021 FINDINGS: Cardiovascular: There are no filling defects within the pulmonary arteries to suggest pulmonary embolus. The heart is normal in size. There is contrast refluxing into the hepatic veins and IVC. Calcification of native coronary  arteries, post median sternotomy. Aortic atherosclerosis without aneurysm. Celiac stent. Mediastinum/Nodes: Small mediastinal and hilar lymph nodes, not enlarged by size criteria. Unremarkable appearance of the esophagus. No thyroid nodule. Lungs/Pleura: Patchy and consolidative left upper lobe opacity with areas of mucoid impaction and bronchial filling. Additional areas of mucoid impaction within the right middle and lower lobe. 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 Areas central bronchial thickening. There is biapical pleuroparenchymal scarring. Bandlike atelectasis or no pleural fluid. Scarring in the right lower lobe. Upper Abdomen: Contrast refluxes into the hepatic veins and IVC. Detailed assessment is limited due to paucity of intra-abdominal fat. Musculoskeletal: Scoliosis and mild degenerative change in the spine. Prior median sternotomy. There are no acute or suspicious osseous abnormalities. Review of the MIP images confirms the above findings. IMPRESSION: 1. No pulmonary embolus. 2. Patchy and consolidative left upper lobe opacity with areas of mucoid impaction and bronchial filling, suspicious for pneumonia. Additional areas of mucoid impaction within the right middle and lower lobe. 3. Contrast refluxing into the hepatic veins and IVC, can be seen with right heart dysfunction. 4. 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 Aortic Atherosclerosis (ICD10-I70.0) and Emphysema (ICD10-J43.9). Electronically Signed   By: Narda Rutherford M.D.   On: 03/15/2023 19:47   DG Chest 2 View Result Date: 03/15/2023 CLINICAL DATA:  Hemoptysis. EXAM: CHEST - 2 VIEW COMPARISON:  Radiograph 08/03/2021 chest CT 04/26/2021 FINDINGS: Normal heart size. Post median sternotomy with epicardial wires in place. Chronic hyperinflation and emphysema. Ill-defined patchy left suprahilar opacity. No pneumothorax or pleural effusion. No acute osseous findings. IMPRESSION: 1. Patchy left suprahilar opacity  suspicious for pneumonia. 2. Chronic hyperinflation and emphysema. Electronically Signed   By: Narda Rutherford M.D.   On: 03/15/2023 19:26    Assessment and Plan: #Acute Hypoxic Respiratory Failure #COPD Exacerbation #Community Acquired Pneumonia - IV Rocephin and Azithromycin -  Prednisone 40mg  daily - Mucinex - Scheduled and PRN Bronchodilators - Urine Legionella and S. Pneumococcal antigen - Follow blood and sputum cultures - Follow fever curve  #Hemoptysis Secondary to above, CTA PE negative - Hold home aspirin and plavix - Mechanical VTE prophylaxis  #Chronic Systolic Congestive Heart Failure -Continue home Entresto, Metoprolol  - Hold home Farxiga  #Hypertension -Continue home Metoprolol, Entresto  #Coronary Artery Disease - Temporarily hold home aspirin and plavix in setting of hemoptysis  #Hyperlipidemia -Continue home atorvastatin  #Chronic Kidney Disease, stage 3 - Avoid nephrotoxins, Contrast Dyes, Hypotension and Dehydration t - Renally Adjust Meds - s/p IVF bolus in ED, Encourage PO fluid intake  #Type II Diabetes Mellitus - Hold home Farxiga - ACHS CBG monitoring and SSI  #Chronic Normocytic Anemia  - Monitor closely in setting of hemoptysis - Repeat CBC in AM  #MGUS Follow with Dr Cathie Hoops with heme/onc outpatient. 07/2022 note recommends surveillance and 6 month follow up.   #Depression with Anxiety -Continue home Celexa and Buspar - Continue at bedtime Seroquel  #Tobacco Use Disorder -Counseled on cessation - Patient declined Nicotine Replacement therapy while inpatient  VTE prophylaxis: SCDs, hold chemoprophylaxis in setting of hemoptysis GI prophylaxis: Protonix Diet: Heart Healthy Access: PIV Lines: NONE Code Status: Full Telemetry: Yes Disposition: Admit to Med-Surg with Telemetry  Advance Care Planning:   Code Status: Limited: Do not attempt resuscitation (DNR) -DNR-LIMITED -Do Not Intubate/DNI  Code status confirmed with patient at  bedside  Consults:   Family Communication: Daughter Eden Lathe and family member at bedside  Severity of Illness: The appropriate patient status for this patient is INPATIENT. Inpatient status is judged to be reasonable and necessary in order to provide the required intensity of service to ensure the patient's safety. The patient's presenting symptoms, physical exam findings, and initial radiographic and laboratory data in the context of their chronic comorbidities is felt to place them at high risk for further clinical deterioration. Furthermore, it is not anticipated that the patient will be medically stable for discharge from the hospital within 2 midnights of admission.   * I certify that at the point of admission it is my clinical judgment that the patient will require inpatient hospital care spanning beyond 2 midnights from the point of admission due to high intensity of service, high risk for further deterioration and high frequency of surveillance required.*  To reach the provider On-Call:   7AM- 7PM see care teams to locate the attending and reach out to them via [email or web address] Password: [email or web address] 7PM-7AM contact night-coverage If you still have difficulty reaching the appropriate provider, please page the [facility] (Director on Call) for [facility] on amion for assistance  This document was prepared using Conservation officer, historic buildings and may include unintentional dictation errors.  Bishop Limbo FNP-BC, PMHNP-BC Nurse Practitioner [facility]

## 2023-03-17 DIAGNOSIS — N1831 Chronic kidney disease, stage 3a: Secondary | ICD-10-CM

## 2023-03-17 DIAGNOSIS — J189 Pneumonia, unspecified organism: Secondary | ICD-10-CM | POA: Diagnosis not present

## 2023-03-17 DIAGNOSIS — D472 Monoclonal gammopathy: Secondary | ICD-10-CM

## 2023-03-17 DIAGNOSIS — I10 Essential (primary) hypertension: Secondary | ICD-10-CM | POA: Diagnosis not present

## 2023-03-17 LAB — GLUCOSE, CAPILLARY
Glucose-Capillary: 104 mg/dL — ABNORMAL HIGH (ref 70–99)
Glucose-Capillary: 148 mg/dL — ABNORMAL HIGH (ref 70–99)
Glucose-Capillary: 154 mg/dL — ABNORMAL HIGH (ref 70–99)
Glucose-Capillary: 133 mg/dL — ABNORMAL HIGH (ref 70–99)

## 2023-03-17 LAB — EXPECTORATED SPUTUM ASSESSMENT W GRAM STAIN, RFLX TO RESP C: Special Requests: NORMAL

## 2023-03-17 LAB — STREP PNEUMONIAE URINARY ANTIGEN: Strep Pneumo Urinary Antigen: NEGATIVE

## 2023-03-17 LAB — GASTROINTESTINAL PANEL BY PCR, STOOL (REPLACES STOOL CULTURE)

## 2023-03-17 LAB — C DIFFICILE QUICK SCREEN W PCR REFLEX
C Diff antigen: NEGATIVE
C Diff interpretation: NOT DETECTED
C Diff toxin: NEGATIVE

## 2023-03-17 LAB — HIV ANTIBODY (ROUTINE TESTING W REFLEX): HIV Screen 4th Generation wRfx: NONREACTIVE

## 2023-03-17 MED ORDER — ARFORMOTEROL TARTRATE 15 MCG/2ML IN NEBU
15.0000 ug | INHALATION_SOLUTION | Freq: Two times a day (BID) | RESPIRATORY_TRACT | Status: DC
Start: 2023-03-17 — End: 2023-03-18
  Administered 2023-03-17 – 2023-03-18 (×2): 15 ug via RESPIRATORY_TRACT
  Filled 2023-03-17 (×4): qty 2

## 2023-03-17 MED ORDER — TRAMADOL HCL 50 MG PO TABS: 25.0000 mg | ORAL_TABLET | Freq: Three times a day (TID) | ORAL | Status: DC | PRN

## 2023-03-17 MED ORDER — TRANEXAMIC ACID FOR INHALATION
500.0000 mg | Freq: Three times a day (TID) | RESPIRATORY_TRACT | Status: DC
  Administered 2023-03-17: 500 mg via RESPIRATORY_TRACT
  Filled 2023-03-17: qty 5

## 2023-03-17 MED ORDER — REVEFENACIN 175 MCG/3ML IN SOLN
175.0000 ug | Freq: Every day | RESPIRATORY_TRACT | Status: DC
  Administered 2023-03-18: 175 ug via RESPIRATORY_TRACT
  Filled 2023-03-17 (×2): qty 3

## 2023-03-17 MED ORDER — SODIUM CHLORIDE 3 % IN NEBU
4.0000 mL | INHALATION_SOLUTION | Freq: Two times a day (BID) | RESPIRATORY_TRACT | Status: DC
  Administered 2023-03-17 – 2023-03-18 (×2): 4 mL via RESPIRATORY_TRACT
  Filled 2023-03-17 (×4): qty 4

## 2023-03-17 MED ORDER — ALPRAZOLAM 0.5 MG PO TABS
1.0000 mg | ORAL_TABLET | Freq: Every evening | ORAL | Status: DC | PRN
  Administered 2023-03-17 – 2023-03-18 (×2): 1 mg via ORAL
  Filled 2023-03-17 (×2): qty 2

## 2023-03-17 NOTE — Evaluation (Signed)
Physical Therapy Evaluation Patient Details Name: Todd Whitehead MRN: [id number] DOB: [birth year] Today's Date: 03/17/2023  History of Present Illness  Todd Whitehead is a 78 y.o. male with medical history significant of  hypertension, hyperlipidemia, COPD not on home oxygen, depression with anxiety, BPH, PVD, CAD, CABG, CHF with EF of 45-50% in 12/2020, CKD-3, type II diabetes mellitus, tobacco use disorder, anemia, hx of melanoma, MGUS, and carotid artery stenosis. He presents to [facility] ED with reports of dyspnea, wheezing, and hemoptysis that started two days ago. Pt on O2 via [place] 2[place].  Clinical Impression  Pt received in bed with O2 via [place] agreeable to PT interventions.  Pt PLOF Ind at household level and community level and driving. Pt loves alone. PT assessment revealed deceased endurance, aerobic capacity, generalized weakness and impaired balance. Pt needed CGA for Transfers and ambulation while pt furniture and wall walked in improper foot wear. Pt is impulsive. Pt ambulated to bathroom to use toilet. Pt tired at the end of the mild exertion. PT communicated with nurse about [place] extension so that pt can use toilet while on [place]. PT referred pt to mobility specialist to improve aerobic capacity. PT will continue in acute care and will benefit from PT beyond Acute.     If plan is discharge home, recommend the following: A little help with walking and/or transfers;A little help with bathing/dressing/bathroom;Assistance with cooking/housework;Assist for transportation   Can travel by private vehicle        Equipment Recommendations Cane  Recommendations for Other Services       Functional Status Assessment Patient has had a recent decline in their functional status and demonstrates the ability to make significant improvements in function in a reasonable and predictable amount of time.     Precautions / Restrictions Precautions Precautions: Fall Restrictions Weight Bearing  Restrictions Per Provider Order: No      Mobility  Bed Mobility Overal bed mobility: Independent                  Transfers Overall transfer level: Needs assistance Equipment used: None (Pt refused to use AD) Transfers: Sit to/from Stand Sit to Stand: Contact guard assist           General transfer comment: Pt holds on to furniture and walls    Ambulation/Gait Ambulation/Gait assistance: Contact guard assist Gait Distance (Feet): 15 Feet Assistive device: None   Gait velocity: dec     General Gait Details: Refeused to use AD and wear Non Skid socks. Did not have shoes and insisted to ambulatte to bathroom. Pt unsteady on feet.  Stairs            Wheelchair Mobility     Tilt Bed    Modified Rankin (Stroke Patients Only)       Balance Overall balance assessment: Needs assistance Sitting-balance support: Feet unsupported Sitting balance-Leahy Scale: Normal     Standing balance support: Single extremity supported Standing balance-Leahy Scale: Fair Standing balance comment: Pt forward flexed, Used Furniture and wall to maintain balance.                             Pertinent Vitals/Pain Pain Assessment Pain Assessment: No/denies pain    Home Living Family/patient expects to be discharged to:: Private residence Living Arrangements: Alone   Type of Home: House Home Access: Stairs to enter Entrance Stairs-Rails: None Entrance Stairs-Number of Steps: 2   Home Layout: One level  Home Equipment: None Additional Comments: PT IND WITHOUT AD    Prior Function Prior Level of Function : Independent/Modified Independent             Mobility Comments: Independent at household and community level activity participation with AD and Driving. ADLs Comments: Ind with COoking, eating, bathing and meds.     Extremity/Trunk Assessment   Upper Extremity Assessment Upper Extremity Assessment: Overall WFL for tasks assessed    Lower  Extremity Assessment Lower Extremity Assessment: Generalized weakness       Communication   Communication Communication: No apparent difficulties    Cognition Arousal: Alert Behavior During Therapy: WFL for tasks assessed/performed, Impulsive   PT - Cognitive impairments: No apparent impairments                         Following commands: Intact       Cueing       General Comments      Exercises     Assessment/Plan    PT Assessment Patient needs continued PT services  PT Problem List Decreased strength;Decreased range of motion;Decreased activity tolerance;Decreased balance;Decreased safety awareness;Cardiopulmonary status limiting activity       PT Treatment Interventions Gait training;Functional mobility training;Stair training;Therapeutic activities;Therapeutic exercise;Balance training;Neuromuscular re-education;Patient/family education    PT Goals (Current goals can be found in the Care Plan section)  Acute Rehab PT Goals Patient Stated Goal: " get better." PT Goal Formulation: With patient Time For Goal Achievement: 03/31/23 Potential to Achieve Goals: Good    Frequency Min 2X/week     Co-evaluation               AM-PAC PT "6 Clicks" Mobility  Outcome Measure Help needed turning from your back to your side while in a flat bed without using bedrails?: None Help needed moving from lying on your back to sitting on the side of a flat bed without using bedrails?: None Help needed moving to and from a bed to a chair (including a wheelchair)?: A Little Help needed standing up from a chair using your arms (e.g., wheelchair or bedside chair)?: A Little Help needed to walk in hospital room?: A Little Help needed climbing 3-5 steps with a railing? : A Little 6 Click Score: 20    End of Session   Activity Tolerance: Treatment limited secondary to medical complications (Comment) Patient left: Other (comment) (Bathroom on toilet for BM.) Nurse  Communication: Mobility status;Other (comment) (Pt on toilet and call bell identified & within reach.) PT Visit Diagnosis: Unsteadiness on feet (R26.81);Muscle weakness (generalized) (M62.81)    Time: 0865-7846 PT Time Calculation (min) (ACUTE ONLY): 17 min   Charges:   PT Evaluation $PT Eval Low Complexity: 1 Low   PT General Charges $$ ACUTE PT VISIT: 1 Visit        Dailan Pfalzgraf PT DPT 1:04 PM,03/17/23

## 2023-03-17 NOTE — Progress Notes (Signed)
1      PROGRESS NOTE    Todd Whitehead  VHQ:[id number] DOB: [birth year] DOA: 03/15/2023 PCP: Lauro Regulus, MD    Brief Narrative:   78 y.o. male with medical history significant of  hypertension, hyperlipidemia, COPD not on home oxygen, depression with anxiety, BPH, PVD, CAD, CABG, CHF with EF of 45-50% in 12/2020, CKD-3, type II diabetes mellitus, tobacco use disorder, anemia, hx of melanoma, MGUS, and carotid artery stenosis. He presents to [facility] ED with reports of dyspnea, wheezing, and hemoptysis   2/16: Pulmonary and palliative care consult   Assessment & Plan:   Principal Problem:   CAP (community acquired pneumonia) Active Problems:   Hypertension   Hyperlipidemia   Depression with anxiety   CAD (coronary artery disease)   Anemia   Tobacco abuse   MGUS (monoclonal gammopathy of unknown significance)   CKD (chronic kidney disease) stage 3, GFR 30-59 ml/min (HCC)   #Acute Hypoxic Respiratory Failure #COPD Exacerbation #Community Acquired Pneumonia -Continue IV Rocephin and Azithromycin -Currently needing 2 L oxygen via nasal cannula - Prednisone 40mg  daily - Mucinex - Scheduled and PRN Bronchodilators - Urine Legionella pending and S. Pneumococcal antigen negative -Negative blood and sputum cultures - Follow fever curve -Pulmonary consult -Chest PT with vest -Add Brovana, 3% hypertonic saline and Yupelri nebulizer -Nebulized TXA per pulmonary   #Hemoptysis Secondary to above, CTA PE negative - Hold home aspirin and plavix - Mechanical VTE prophylaxis -Pulmonary consult to decide need for bronchoscopy   #Chronic Systolic Congestive Heart Failure -Continue home Entresto, Metoprolol  - Hold home Farxiga   #Hypertension -Continue home Metoprolol, Entresto   #Coronary Artery Disease - Temporarily hold home aspirin and plavix in setting of hemoptysis   #Hyperlipidemia -Continue home atorvastatin   #Chronic Kidney Disease, stage 3 -  Avoid nephrotoxins, Contrast Dyes, Hypotension and Dehydration t - Renally Adjust Meds -Patient is eating so no need for IV fluids   #Type II Diabetes Mellitus - Hold home Farxiga - ACHS CBG monitoring and SSI   #Chronic Normocytic Anemia  - Monitor closely in setting of hemoptysis - Repeat CBC in AM   #MGUS Follow with Dr Cathie Hoops with heme/onc outpatient. 07/2022 note recommends surveillance and 6 month follow up.    #Depression with Anxiety -Continue home Celexa and Buspar - Continue at bedtime Seroquel   #Tobacco Use Disorder -Counseled on cessation - Patient declined Nicotine Replacement therapy while inpatient   DVT prophylaxis: SCDs Place and maintain sequential compression device Start: 03/16/23 0942     Code Status: DNR Family Communication: None at bedside Disposition Plan: Possible discharge in next 1 to 2 days depending on clinical condition, pulmonary workup   Consultants:  Pulmonary Palliative care   Antimicrobials:  Rocephin Zithromax   Subjective:  Reports hemoptysis frequency and quantity has reduced.  Still quite short of breath at rest and with minimal ambulation.  Objective: Vitals:   03/17/23 0415 03/17/23 0803 03/17/23 0824 03/17/23 1244  BP: (!) 145/62  138/61 (!) 127/95  Pulse: 74  76 86  Resp: (!) 21  (!) 22   Temp: 98.9 F (37.2 C)  98 F (36.7 C) 98.1 F (36.7 C)  TempSrc: Oral  Oral Oral  SpO2: 94% 94% 100% 97%  Weight:      Height:        Intake/Output Summary (Last 24 hours) at 03/17/2023 1344 Last data filed at 03/17/2023 1120 Gross per 24 hour  Intake 714.82 ml  Output 352 ml  Net 362.82 ml   Filed Weights   03/15/23 1706  Weight: 52.2 kg    Examination:  General exam: Appears calm and comfortable, cachectic looking Respiratory system: Mild expiratory wheezing bilaterally.  Rhonchi at bases. Cardiovascular system: S1 & S2 heard, RRR. No JVD, murmurs, No pedal edema. Gastrointestinal system: Abdomen is soft,  benign Central nervous system: Alert and oriented. No focal neurological deficits. Extremities: Symmetric 5 x 5 power. Skin: No rashes, lesions or ulcers Psychiatry: Judgement and insight appear normal. Mood & affect appropriate.     Data Reviewed: I have personally reviewed following labs and imaging studies  CBC: Recent Labs  Lab 03/15/23 1708  WBC 8.1  HGB 10.4*  HCT 30.3*  MCV 89.6  PLT 178   Basic Metabolic Panel: Recent Labs  Lab 03/15/23 1708  NA 130*  K 4.0  CL 95*  CO2 22  GLUCOSE 211*  BUN 27*  CREATININE 1.25*  CALCIUM 9.1   GFR: Estimated Creatinine Clearance: 36.5 mL/min (A) (by C-G formula based on SCr of 1.25 mg/dL (H)). Liver Function Tests: Recent Labs  Lab 03/15/23 1708  AST 33  ALT 19  ALKPHOS 73  BILITOT 0.8  PROT 6.8  ALBUMIN 3.9   No results for input(s): "LIPASE", "AMYLASE" in the last 168 hours. No results for input(s): "AMMONIA" in the last 168 hours. Coagulation Profile: Recent Labs  Lab 03/16/23 0015  INR 1.0   Cardiac Enzymes: No results for input(s): "CKTOTAL", "CKMB", "CKMBINDEX", "TROPONINI" in the last 168 hours. BNP (last 3 results) No results for input(s): "PROBNP" in the last 8760 hours. HbA1C: Recent Labs    03/16/23 0442  HGBA1C 6.0*   CBG: Recent Labs  Lab 03/16/23 1209 03/16/23 1743 03/16/23 2105 03/17/23 0809 03/17/23 1130  GLUCAP 171* 178* 200* 104* 148*   Lipid Profile: No results for input(s): "CHOL", "HDL", "LDLCALC", "TRIG", "CHOLHDL", "LDLDIRECT" in the last 72 hours. Thyroid Function Tests: No results for input(s): "TSH", "T4TOTAL", "FREET4", "T3FREE", "THYROIDAB" in the last 72 hours. Anemia Panel: No results for input(s): "VITAMINB12", "FOLATE", "FERRITIN", "TIBC", "IRON", "RETICCTPCT" in the last 72 hours. Sepsis Labs: Recent Labs  Lab 03/16/23 0015 03/16/23 0442  LATICACIDVEN 0.9 0.8    Recent Results (from the past 240 hours)  Resp panel by RT-PCR (RSV, Flu A&B, Covid) Anterior  Nasal Swab     Status: None   Collection Time: 03/15/23  5:16 PM   Specimen: Anterior Nasal Swab  Result Value Ref Range Status   SARS Coronavirus 2 by RT PCR NEGATIVE NEGATIVE Final    Comment: (NOTE) SARS-CoV-2 target nucleic acids are NOT DETECTED.  The SARS-CoV-2 RNA is generally detectable in upper respiratory specimens during the acute phase of infection. The lowest concentration of SARS-CoV-2 viral copies this assay can detect is 138 copies/mL. A negative result does not preclude SARS-Cov-2 infection and should not be used as the sole basis for treatment or other patient management decisions. A negative result may occur with  improper specimen collection/handling, submission of specimen other than nasopharyngeal swab, presence of viral mutation(s) within the areas targeted by this assay, and inadequate number of viral copies(<138 copies/mL). A negative result must be combined with clinical observations, patient history, and epidemiological information. The expected result is Negative.  Fact Sheet for Patients:  BloggerCourse.com  Fact Sheet for Healthcare Providers:  SeriousBroker.it  This test is no t yet approved or cleared by the [place] FDA and  has been authorized for detection and/or diagnosis of SARS-CoV-2 by FDA  under an Emergency Use Authorization (EUA). This EUA will remain  in effect (meaning this test can be used) for the duration of the COVID-19 declaration under Section 564(b)(1) of the Act, 21 U.S.C.section 360bbb-3(b)(1), unless the authorization is terminated  or revoked sooner.       Influenza A by PCR NEGATIVE NEGATIVE Final   Influenza B by PCR NEGATIVE NEGATIVE Final    Comment: (NOTE) The Xpert Xpress SARS-CoV-2/FLU/RSV plus assay is intended as an aid in the diagnosis of influenza from Nasopharyngeal swab specimens and should not be used as a sole basis for treatment. Nasal washings  and aspirates are unacceptable for Xpert Xpress SARS-CoV-2/FLU/RSV testing.  Fact Sheet for Patients: BloggerCourse.com  Fact Sheet for Healthcare Providers: SeriousBroker.it  This test is not yet approved or cleared by the [place] FDA and has been authorized for detection and/or diagnosis of SARS-CoV-2 by FDA under an Emergency Use Authorization (EUA). This EUA will remain in effect (meaning this test can be used) for the duration of the COVID-19 declaration under Section 564(b)(1) of the Act, 21 U.S.C. section 360bbb-3(b)(1), unless the authorization is terminated or revoked.     Resp Syncytial Virus by PCR NEGATIVE NEGATIVE Final    Comment: (NOTE) Fact Sheet for Patients: BloggerCourse.com  Fact Sheet for Healthcare Providers: SeriousBroker.it  This test is not yet approved or cleared by the [place] FDA and has been authorized for detection and/or diagnosis of SARS-CoV-2 by FDA under an Emergency Use Authorization (EUA). This EUA will remain in effect (meaning this test can be used) for the duration of the COVID-19 declaration under Section 564(b)(1) of the Act, 21 U.S.C. section 360bbb-3(b)(1), unless the authorization is terminated or revoked.  Performed at [facility], [address] [place], [place] [address]   Blood culture (routine x 2)     Status: None (Preliminary result)   Collection Time: 03/16/23 12:15 AM   Specimen: BLOOD LEFT FOREARM  Result Value Ref Range Status   Specimen Description BLOOD LEFT FOREARM  Final   Special Requests   Final    BOTTLES DRAWN AEROBIC AND ANAEROBIC Blood Culture adequate volume   Culture   Final    NO GROWTH 1 DAY Performed at [facility], [address], [place], [place] [address]    Report Status PENDING  Incomplete  Blood culture (routine x 2)     Status: None (Preliminary result)    Collection Time: 03/16/23 12:15 AM   Specimen: BLOOD LEFT HAND  Result Value Ref Range Status   Specimen Description BLOOD LEFT HAND  Final   Special Requests   Final    BOTTLES DRAWN AEROBIC AND ANAEROBIC Blood Culture adequate volume   Culture   Final    NO GROWTH 1 DAY Performed at [facility], [address] [place], [place] [address]    Report Status PENDING  Incomplete  Expectorated Sputum Assessment w Gram Stain, Rflx to Resp Cult     Status: None   Collection Time: 03/16/23  8:15 AM   Specimen: Sputum  Result Value Ref Range Status   Specimen Description SPUTUM  Final   Special Requests Normal  Final   Sputum evaluation   Final    Sputum specimen not acceptable for testing.  Please recollect.   RESULT CALLED TO, READ BACK BY AND VERIFIED WITH: Prudence Davidson AT 1227 ON 03/17/23 BY SS Performed at [facility], [address], [place], [place] [address]    Report Status 03/17/2023  FINAL  Final         Radiology Studies: CT Angio Chest Pulmonary Embolism (PE) W or WO Contrast Result Date: 03/15/2023 CLINICAL DATA:  Hemoptysis. EXAM: CT ANGIOGRAPHY CHEST WITH CONTRAST TECHNIQUE: Multidetector CT imaging of the chest was performed using the standard protocol during bolus administration of intravenous contrast. Multiplanar CT image reconstructions and MIPs were obtained to evaluate the vascular anatomy. RADIATION DOSE REDUCTION: This exam was performed according to the departmental dose-optimization program which includes automated exposure control, adjustment of the mA and/or kV according to patient size and/or use of iterative reconstruction technique. CONTRAST:  60mL OMNIPAQUE IOHEXOL 350 MG/ML SOLN COMPARISON:  Radiograph earlier today.  Chest CT 04/26/2021 FINDINGS: Cardiovascular: There are no filling defects within the pulmonary arteries to suggest pulmonary embolus. The heart is normal in size. There is contrast refluxing into the hepatic veins and IVC.  Calcification of native coronary arteries, post median sternotomy. Aortic atherosclerosis without aneurysm. Celiac stent. Mediastinum/Nodes: Small mediastinal and hilar lymph nodes, not enlarged by size criteria. Unremarkable appearance of the esophagus. No thyroid nodule. Lungs/Pleura: Patchy and consolidative left upper lobe opacity with areas of mucoid impaction and bronchial filling. Additional areas of mucoid impaction within the right middle and lower lobe. 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 Areas central bronchial thickening. There is biapical pleuroparenchymal scarring. Bandlike atelectasis or no pleural fluid. Scarring in the right lower lobe. Upper Abdomen: Contrast refluxes into the hepatic veins and IVC. Detailed assessment is limited due to paucity of intra-abdominal fat. Musculoskeletal: Scoliosis and mild degenerative change in the spine. Prior median sternotomy. There are no acute or suspicious osseous abnormalities. Review of the MIP images confirms the above findings. IMPRESSION: 1. No pulmonary embolus. 2. Patchy and consolidative left upper lobe opacity with areas of mucoid impaction and bronchial filling, suspicious for pneumonia. Additional areas of mucoid impaction within the right middle and lower lobe. 3. Contrast refluxing into the hepatic veins and IVC, can be seen with right heart dysfunction. 4. 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 Aortic Atherosclerosis (ICD10-I70.0) and Emphysema (ICD10-J43.9). Electronically Signed   By: Narda Rutherford M.D.   On: 03/15/2023 19:47   DG Chest 2 View Result Date: 03/15/2023 CLINICAL DATA:  Hemoptysis. EXAM: CHEST - 2 VIEW COMPARISON:  Radiograph 08/03/2021 chest CT 04/26/2021 FINDINGS: Normal heart size. Post median sternotomy with epicardial wires in place. Chronic hyperinflation and emphysema. Ill-defined patchy left suprahilar opacity. No pneumothorax or pleural effusion. No acute osseous findings. IMPRESSION: 1. Patchy  left suprahilar opacity suspicious for pneumonia. 2. Chronic hyperinflation and emphysema. Electronically Signed   By: Narda Rutherford M.D.   On: 03/15/2023 19:26        Scheduled Meds:  arformoterol  15 mcg Nebulization BID   atorvastatin  40 mg Oral Daily   brimonidine  1 drop Both Eyes BID   busPIRone  30 mg Oral BID   citalopram  20 mg Oral Daily   guaiFENesin  600 mg Oral BID   insulin aspart  0-5 Units Subcutaneous QHS   insulin aspart  0-9 Units Subcutaneous TID WC   latanoprost  1 drop Both Eyes QHS   metoprolol succinate  12.5 mg Oral Daily   nicotine  21 mg Transdermal Daily   predniSONE  40 mg Oral Q breakfast   QUEtiapine  50 mg Oral QHS   revefenacin  175 mcg Nebulization Daily   sacubitril-valsartan  1 tablet Oral BID   sodium chloride HYPERTONIC  4 mL Nebulization BID   tamsulosin  0.4 mg Oral QPC breakfast   tranexamic acid  500 mg Nebulization Q8H   Continuous Infusions:  azithromycin 500 mg (03/17/23 0103)   cefTRIAXone (ROCEPHIN)  IV Stopped (03/17/23 0148)     LOS: 1 day    Time spent: 35 minutes    Ivon Roedel Sherryll Burger, MD [facility] Pager [phone]  If 7PM-7AM, please contact night-coverage [email or web address]  03/17/2023, 1:44 PM

## 2023-03-17 NOTE — Progress Notes (Signed)
Throughout shift, pt observed to have frequent labored breathing, tachypneia, and shortness of breath with any change of position, including dangling and standing at bedside. Expiratory wheezes also noted by this RN. Pt also having frequently coughing fits lasting several minutes and stating that he "feels like he can't breathe" when this occurs.  MD Sherryll Burger made aware.   Approximately 1300--This RN rounded on pt and observed him to be in bathroom without wearing [place]. Pt stated that he wanted to try and walk into restroom instead of using BSC. This RN assisted pt back to bed, pt noted to be incredibly weak with ambulation. Pt refusing walker and nonslip socks.   SpO2 obtained and reading 79-80%. Pt took around 10 minutes to recover before SpO2 returned to >90%. Pt educated on importance of calling staff prior to ambulating, and for the need to utilize [facility] d/t his work of breathing. Pt now back on 2L [place], no S/S of distress noted. MD Sherryll Burger notified.

## 2023-03-17 NOTE — Progress Notes (Signed)
   03/17/23 0900  Spiritual Encounters  Type of Visit Initial  Care provided to: Patient  Referral source Nurse (RN/NT/LPN)  Reason for visit Advance directives  OnCall Visit Yes   Chaplain responded to request to provide HCPOA information. Patient says he has this all worked out with his family.

## 2023-03-17 NOTE — TOC Initial Note (Signed)
Transition of Care [facility]) - Initial/Assessment Note    Patient Details  Name: Todd Whitehead MRN: [id number] Date of Birth: [birth year]  Transition of Care [facility]) CM/SW Contact:    Liliana Cline, LCSW Phone Number: 03/17/2023, 3:09 PM  Clinical Narrative:                 CSW spoke with patient regarding therapy recommendations for [facility] and a cane. Patient is from home alone and drives. PCP is Dr. Dareen Piano. Pharmacy is [facility]. Patient states he had HH in the past, is aware of therapy recommendations, but does not feel he needs HH at this time. He is aware he would need to reach out to his PCP if he changes his mind about HH post discharge. Patient is on acute o2 - he confirms he does not have o2 at home. He also does not have a cane at home. He wants to see if he still needs a cane and o2 closer to discharge before TOC orders it. TOC handoff updated to reflect this.  Expected Discharge Plan: Home/Self Care Barriers to Discharge: Continued Medical Work up   Patient Goals and CMS Choice Patient states their goals for this hospitalization and ongoing recovery are:: declines HH CMS Medicare.gov Compare Post Acute Care list provided to:: Patient Choice offered to / list presented to : Patient      Expected Discharge Plan and Services       Living arrangements for the past 2 months: Single Family Home                                      Prior Living Arrangements/Services Living arrangements for the past 2 months: Single Family Home Lives with:: Self Patient language and need for interpreter reviewed:: Yes Do you feel safe going back to the place where you live?: Yes      Need for Family Participation in Patient Care: Yes (Comment) Care giver support system in place?: Yes (comment)   Criminal Activity/Legal Involvement Pertinent to Current Situation/Hospitalization: No - Comment as needed  Activities of Daily Living   ADL Screening (condition at time of  admission) Independently performs ADLs?: Yes (appropriate for developmental age) Is the patient deaf or have difficulty hearing?: No Does the patient have difficulty seeing, even when wearing glasses/contacts?: No Does the patient have difficulty concentrating, remembering, or making decisions?: No  Permission Sought/Granted Permission sought to share information with : Facility Industrial/product designer granted to share information with : Yes, Verbal Permission Granted     Permission granted to share info w AGENCY: DME companies if indicated        Emotional Assessment       Orientation: : Oriented to Self, Oriented to Place, Oriented to  Time, Oriented to Situation Alcohol / Substance Use: Not Applicable Psych Involvement: No (comment)  Admission diagnosis:  Pneumonia [J18.9] Acute respiratory failure with hypoxia (HCC) [J96.01] COPD with acute exacerbation (HCC) [J44.1] Hemoptysis [R04.2] Multifocal pneumonia [J18.9] Patient Active Problem List   Diagnosis Date Noted   Pneumonia 03/16/2023   CKD (chronic kidney disease) stage 3, GFR 30-59 ml/min (HCC) 08/27/2022   MGUS (monoclonal gammopathy of unknown significance) 05/17/2021   Chronic mesenteric ischemia (HCC) 03/23/2021   Acute on chronic systolic CHF (congestive heart failure) (HCC) 01/26/2021   Hypertensive urgency 01/26/2021   HLD (hyperlipidemia) 01/26/2021   COPD (chronic obstructive pulmonary disease) (HCC) 01/26/2021  Tobacco abuse 01/26/2021   Hemoptysis 01/26/2021   Hypokalemia 01/26/2021   Lung nodule 01/26/2021   Elevated troponin 01/26/2021   Normocytic anemia 01/26/2021   Protein-calorie malnutrition, severe (HCC) 01/26/2021   CAP (community acquired pneumonia) 01/26/2021   SOB (shortness of breath)    Hypoxia    Symptomatic anemia    CHF (congestive heart failure) (HCC) 12/09/2020   Anemia 11/18/2020   Low serum vitamin B12 11/18/2020   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    Hyperlipidemia 02/07/2018   Epistaxis 11/08/2017   PAD (peripheral artery disease) (HCC) 09/28/2017   Hypertension 06/25/2017   Abdominal pain 06/25/2017   Aortic atherosclerosis (HCC) 06/25/2017   Type 2 diabetes mellitus with stage 3 chronic kidney disease, without long-term current use of insulin (HCC) 12/24/2016   Bile duct calculus with acute cholecystitis 08/17/2016   Acute cholecystitis    Personal history of tobacco use, presenting hazards to health 07/08/2016   Health care maintenance 12/19/2015   Elevated LFTs 10/10/2015   Edema of right lower extremity 10/10/2015   COPD (chronic obstructive pulmonary disease) with chronic bronchitis (HCC) 10/04/2015   Depression with anxiety 09/11/2015   Carotid artery disease (HCC) 09/09/2015   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   CAD (coronary artery disease) 08/22/2015   Benign non-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 03/21/2015   Chronic low back pain 03/21/2015   Depression, major, in remission (HCC) 03/21/2015   PCP:  Lauro Regulus, MD Pharmacy:   [id number] - [place], [place] - [address] AT [facility] OF [address] [place] [address] [place] [address] Phone: [phone] Fax: [phone]  [facility] [place], [place] - [address] [place] [address] Phone: [phone] Fax: [phone]     Social Drivers of Health (SDOH) Social History: SDOH Screenings   Food Insecurity: No Food Insecurity (03/16/2023)  Housing: Low Risk  (03/16/2023)  Transportation Needs: No Transportation Needs (03/16/2023)  Utilities: Not At Risk (03/16/2023)  Depression (PHQ2-9): Medium Risk (10/05/2021)  Social Connections: Moderately Integrated (03/16/2023)  Tobacco Use: High Risk (03/15/2023)   SDOH Interventions:     Readmission Risk Interventions    01/27/2021   12:18 PM  Readmission Risk Prevention Plan  Transportation Screening  Complete  PCP or Specialist Appt within 5-7 Days Complete  Home Care Screening Patient refused  Medication Review (RN CM) Complete

## 2023-03-17 NOTE — Consult Note (Signed)
PULMONOLOGY         Date: 03/17/2023,   MRN# [id number] Todd Whitehead [birth year]     AdmissionWeight: 52.2 kg                 CurrentWeight: 52.2 kg  Referring provider: Dr Sherryll Burger   CHIEF COMPLAINT:   Non-massive hemoptysis and mucus plugging of tracheobronchial tree   HISTORY OF PRESENT ILLNESS   This is a 78 year old male he has a history of essential hypertension, dyslipidemia lifelong smoking history centrilobular and paraseptal emphysema with COPD however has not been on oxygen in the past.  Also does have significant systolic CHF and coronary disease status post bypass as well as CKD.  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  Patient comes in with reports of progressive worsening dyspnea shortness of breath wheezing and hemoptysis.  While in the ER he was found to be borderline hypotensive with hypoxemia requiring supplemental oxygen, his SpO2 was 86%.  Blood work showed worsening anemia from a hemoglobin over 11 to now 10.4 on admission also hyponatremia and hyperglycemia.  He had an abnormal x-ray with left lung interstitial opacification.  He had a CT PE performed and this was negative for blood clots however did show infiltrate of the left upper lobe as well as mucous plugging and bronchitic changes bilaterally with previously seen centrilobular and paraseptal emphysema.  He was empirically treated for pneumonia with community-acquired pneumonia regimen including Rocephin and Zithromax, resuscitated with IV fluids and also treated with IV steroid Solu-Medrol.  PCCM consultation was placed for massive hemoptysis with mucous plugging of the tracheobronchial tree worse at the right middle lobe and right lower lobe.   PAST MEDICAL HISTORY   Past Medical History:  Diagnosis Date   Anxiety    Arthritis    CHF (congestive heart failure) (HCC)    Colon polyp    COPD (chronic obstructive pulmonary disease) (HCC)    smoker   Dysrhythmia    Glaucoma     Heart murmur    Hypertension    on  no meds   Low serum vitamin B12 11/18/2020   Melanoma (HCC)    Resected from Left upper arm.    Myocardial infarction [facility]) 2017   Peripheral vascular disease [facility])    Prostate enlargement      SURGICAL HISTORY   Past Surgical History:  Procedure Laterality Date   BACK SURGERY  01/19/98   CARDIAC CATHETERIZATION N/A 08/19/2015   Procedure: Left Heart Cath and Coronary Angiography;  Surgeon: Dalia Heading, MD;  Location: [facility] INVASIVE CV LAB;  Service: Cardiovascular;  Laterality: N/A;   CHOLECYSTECTOMY N/A 08/19/2016   Procedure: LAPAROSCOPIC CHOLECYSTECTOMY WITH INTRAOPERATIVE CHOLANGIOGRAM;  Surgeon: Almond Lint, MD;  Location: [facility] OR;  Service: General;  Laterality: N/A;   COLON SURGERY  age 28 days old   " bowel was twisted up"   COLONOSCOPY WITH PROPOFOL N/A 03/19/2016   Procedure: COLONOSCOPY WITH PROPOFOL;  Surgeon: Christena Deem, MD;  Location: [facility] ENDOSCOPY;  Service: Endoscopy;  Laterality: N/A;   CORONARY ARTERY BYPASS GRAFT  2017   ERCP N/A 08/18/2016   Procedure: ENDOSCOPIC RETROGRADE CHOLANGIOPANCREATOGRAPHY (ERCP);  Surgeon: Kerin Salen, MD;  Location: [facility] ENDOSCOPY;  Service: Gastroenterology;  Laterality: N/A;   ESOPHAGOGASTRODUODENOSCOPY (EGD) WITH PROPOFOL N/A 09/18/2018   Procedure: ESOPHAGOGASTRODUODENOSCOPY (EGD) WITH PROPOFOL;  Surgeon: Sung Amabile, DO;  Location: [facility] ENDOSCOPY;  Service: General;  Laterality: N/A;   EYE SURGERY Left 03/26/11  LOWER EXTREMITY ANGIOGRAPHY Left 10/03/2017   Procedure: LOWER EXTREMITY ANGIOGRAPHY;  Surgeon: Annice Needy, MD;  Location: [facility] INVASIVE CV LAB;  Service: Cardiovascular;  Laterality: Left;   LOWER EXTREMITY ANGIOGRAPHY Right 10/31/2017   Procedure: LOWER EXTREMITY ANGIOGRAPHY;  Surgeon: Annice Needy, MD;  Location: [facility] INVASIVE CV LAB;  Service: Cardiovascular;  Laterality: Right;   LUMBAR LAMINECTOMY/DECOMPRESSION MICRODISCECTOMY Right 10/02/2012   Procedure: Right Lumbar  Five-Sacral One Lumbar laminotomy/Microdiskectomy;  Surgeon: Hewitt Shorts, MD;  Location: [facility] NEURO ORS;  Service: Neurosurgery;  Laterality: Right;  Right Lumbar Five-Sacral One Lumbar laminotomy/Microdiskectomy   VISCERAL ANGIOGRAPHY N/A 11/08/2020   Procedure: VISCERAL ANGIOGRAPHY;  Surgeon: Renford Dills, MD;  Location: [facility] INVASIVE CV LAB;  Service: Cardiovascular;  Laterality: N/A;   VISCERAL ANGIOGRAPHY N/A 11/25/2020   Procedure: VISCERAL ANGIOGRAPHY;  Surgeon: Renford Dills, MD;  Location: [facility] INVASIVE CV LAB;  Service: Cardiovascular;  Laterality: N/A;     FAMILY HISTORY   Family History  Problem Relation Age of Onset   Heart attack Father    Aneurysm Brother    Lung cancer Brother      SOCIAL HISTORY   Social History   Tobacco Use   Smoking status: Every Day    Current packs/day: 1.00    Average packs/day: 1 pack/day for 58.0 years (58.0 ttl pk-yrs)    Types: Cigarettes   Smokeless tobacco: Never  Vaping Use   Vaping status: Former  Substance Use Topics   Alcohol use: No   Drug use: No     MEDICATIONS    Home Medication:    Current Medication:  Current Facility-Administered Medications:    acetaminophen (TYLENOL) tablet 650 mg, 650 mg, Oral, Q6H PRN, 650 mg at 03/16/23 2243 **OR** acetaminophen (TYLENOL) suppository 650 mg, 650 mg, Rectal, Q6H PRN, Andris Baumann, MD   albuterol (PROVENTIL) (2.5 MG/3ML) 0.083% nebulizer solution 2.5 mg, 2.5 mg, Nebulization, Q4H PRN, Foust, Katy L, NP, 2.5 mg at 03/16/23 1318   arformoterol (BROVANA) nebulizer solution 15 mcg, 15 mcg, Nebulization, BID, Sherryll Burger, Vipul, MD   atorvastatin (LIPITOR) tablet 40 mg, 40 mg, Oral, Daily, Foust, Katy L, NP, 40 mg at 03/17/23 0825   azithromycin (ZITHROMAX) 500 mg in sodium chloride 0.9 % 250 mL IVPB, 500 mg, Intravenous, Q24H, Foust, Katy L, NP, Last Rate: 250 mL/hr at 03/17/23 0103, 500 mg at 03/17/23 0103   brimonidine (ALPHAGAN) 0.15 % ophthalmic solution 1 drop, 1  drop, Both Eyes, BID, Foust, Katy L, NP, 1 drop at 03/17/23 0827   busPIRone (BUSPAR) tablet 30 mg, 30 mg, Oral, BID, Foust, Katy L, NP, 30 mg at 03/17/23 0826   cefTRIAXone (ROCEPHIN) 2 g in sodium chloride 0.9 % 100 mL IVPB, 2 g, Intravenous, Q24H, Foust, Katy L, NP, Last Rate: 200 mL/hr at 03/17/23 0059, 2 g at 03/17/23 0059   citalopram (CELEXA) tablet 20 mg, 20 mg, Oral, Daily, Foust, Katy L, NP, 20 mg at 03/17/23 0826   guaiFENesin (MUCINEX) 12 hr tablet 600 mg, 600 mg, Oral, BID, Foust, Katy L, NP, 600 mg at 03/17/23 0825   guaiFENesin-dextromethorphan (ROBITUSSIN DM) 100-10 MG/5ML syrup 5 mL, 5 mL, Oral, Q4H PRN, Lindajo Royal V, MD, 5 mL at 03/16/23 1318   HYDROcodone-acetaminophen (NORCO/VICODIN) 5-325 MG per tablet 1-2 tablet, 1-2 tablet, Oral, Q4H PRN, Andris Baumann, MD   insulin aspart (novoLOG) injection 0-5 Units, 0-5 Units, Subcutaneous, QHS, Duncan, Hazel V, MD   insulin aspart (novoLOG) injection 0-9 Units, 0-9 Units, Subcutaneous,  TID WC, Andris Baumann, MD, 2 Units at 03/16/23 1813   latanoprost (XALATAN) 0.005 % ophthalmic solution 1 drop, 1 drop, Both Eyes, QHS, Foust, Katy L, NP, 1 drop at 03/16/23 2237   metoprolol succinate (TOPROL-XL) 24 hr tablet 12.5 mg, 12.5 mg, Oral, Daily, Foust, Katy L, NP, 12.5 mg at 03/17/23 0825   nicotine (NICODERM CQ - dosed in mg/24 hours) patch 21 mg, 21 mg, Transdermal, Daily, Sherryll Burger, Vipul, MD, 21 mg at 03/17/23 0830   ondansetron (ZOFRAN) tablet 4 mg, 4 mg, Oral, Q6H PRN **OR** ondansetron (ZOFRAN) injection 4 mg, 4 mg, Intravenous, Q6H PRN, Andris Baumann, MD   Oral care mouth rinse, 15 mL, Mouth Rinse, PRN, Sherryll Burger, Vipul, MD   predniSONE (DELTASONE) tablet 40 mg, 40 mg, Oral, Q breakfast, Foust, Katy L, NP, 40 mg at 03/17/23 0825   QUEtiapine (SEROQUEL) tablet 50 mg, 50 mg, Oral, QHS, Foust, Katy L, NP, 50 mg at 03/16/23 2112   revefenacin (YUPELRI) nebulizer solution 175 mcg, 175 mcg, Nebulization, Daily, Sherryll Burger, Vipul, MD    sacubitril-valsartan (ENTRESTO) 49-51 mg per tablet, 1 tablet, Oral, BID, Foust, Katy L, NP, 1 tablet at 03/17/23 2952   sodium chloride HYPERTONIC 3 % nebulizer solution 4 mL, 4 mL, Nebulization, BID, Sherryll Burger, Vipul, MD   tamsulosin (FLOMAX) capsule 0.4 mg, 0.4 mg, Oral, QPC breakfast, Foust, Katy L, NP, 0.4 mg at 03/17/23 0825   traMADol (ULTRAM) tablet 25 mg, 25 mg, Oral, TID PRN, Foust, Katy L, NP    ALLERGIES   Other, Poison ivy extract, and Poison oak extract     REVIEW OF SYSTEMS    Review of Systems:  Gen:  Denies  fever, sweats, chills weigh loss  HEENT: Denies blurred vision, double vision, ear pain, eye pain, hearing loss, nose bleeds, sore throat Cardiac:  No dizziness, chest pain or heaviness, chest tightness,edema Resp:   reports dyspnea chronically  Gi: Denies swallowing difficulty, stomach pain, nausea or vomiting, diarrhea, constipation, bowel incontinence Gu:  Denies bladder incontinence, burning urine Ext:   Denies Joint pain, stiffness or swelling Skin: Denies  skin rash, easy bruising or bleeding or hives Endoc:  Denies polyuria, polydipsia , polyphagia or weight change Psych:   Denies depression, insomnia or hallucinations   Other:  All other systems negative   VS: BP 138/61 (BP Location: Left Arm)   Pulse 76   Temp 98 F (36.7 C) (Oral)   Resp (!) 22   Ht 5\' 8"  (1.727 m)   Wt 52.2 kg   SpO2 100%   BMI 17.49 kg/m      PHYSICAL EXAM    GENERAL:NAD, no fevers, chills, no weakness no fatigue HEAD: Normocephalic, atraumatic.  EYES: Pupils equal, round, reactive to light. Extraocular muscles intact. No scleral icterus.  MOUTH: Moist mucosal membrane. Dentition intact. No abscess noted.  EAR, NOSE, THROAT: Clear without exudates. No external lesions.  NECK: Supple. No thyromegaly. No nodules. No JVD.  PULMONARY: decreased breath sounds with mild rhonchi worse at bases bilaterally.  CARDIOVASCULAR: S1 and S2. Regular rate and rhythm. No murmurs,  rubs, or gallops. No edema. Pedal pulses 2+ bilaterally.  GASTROINTESTINAL: Soft, nontender, nondistended. No masses. Positive bowel sounds. No hepatosplenomegaly.  MUSCULOSKELETAL: No swelling, clubbing, or edema. Range of motion full in all extremities.  NEUROLOGIC: Cranial nerves II through XII are intact. No gross focal neurological deficits. Sensation intact. Reflexes intact.  SKIN: No ulceration, lesions, rashes, or cyanosis. Skin warm and dry. Turgor intact.  PSYCHIATRIC: Mood, affect  within normal limits. The patient is awake, alert and oriented x 3. Insight, judgment intact.       IMAGING     Narrative & Impression  CLINICAL DATA:  Hemoptysis.   EXAM: CT ANGIOGRAPHY CHEST WITH CONTRAST   TECHNIQUE: Multidetector CT imaging of the chest was performed using the standard protocol during bolus administration of intravenous contrast. Multiplanar CT image reconstructions and MIPs were obtained to evaluate the vascular anatomy.   RADIATION DOSE REDUCTION: This exam was performed according to the departmental dose-optimization program which includes automated exposure control, adjustment of the mA and/or kV according to patient size and/or use of iterative reconstruction technique.   CONTRAST:  60mL OMNIPAQUE IOHEXOL 350 MG/ML SOLN   COMPARISON:  Radiograph earlier today.  Chest CT 04/26/2021   FINDINGS: Cardiovascular: There are no filling defects within the pulmonary arteries to suggest pulmonary embolus. The heart is normal in size. There is contrast refluxing into the hepatic veins and IVC. Calcification of native coronary arteries, post median sternotomy. Aortic atherosclerosis without aneurysm. Celiac stent.   Mediastinum/Nodes: Small mediastinal and hilar lymph nodes, not enlarged by size criteria. Unremarkable appearance of the esophagus. No thyroid nodule.   Lungs/Pleura: Patchy and consolidative left upper lobe opacity with areas of mucoid impaction and  bronchial filling. Additional areas of mucoid impaction within the right middle and lower lobe. 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 Areas central bronchial thickening. There is biapical pleuroparenchymal scarring. Bandlike atelectasis or no pleural fluid. Scarring in the right lower lobe.   Upper Abdomen: Contrast refluxes into the hepatic veins and IVC. Detailed assessment is limited due to paucity of intra-abdominal fat.   Musculoskeletal: Scoliosis and mild degenerative change in the spine. Prior median sternotomy. There are no acute or suspicious osseous abnormalities.   Review of the MIP images confirms the above findings.   IMPRESSION: 1. No pulmonary embolus. 2. Patchy and consolidative left upper lobe opacity with areas of mucoid impaction and bronchial filling, suspicious for pneumonia. Additional areas of mucoid impaction within the right middle and lower lobe. 3. Contrast refluxing into the hepatic veins and IVC, can be seen with right heart dysfunction. 4. 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   Aortic Atherosclerosis (ICD10-I70.0) and Emphysema (ICD10-J43.9).     Electronically Signed   By: Narda Rutherford M.D.   On: 03/15/2023 19:47     ASSESSMENT/PLAN   Acute on chronic hypoxemic respiratory failure                   - Left upper lobe community acquired pneumonia                      - agree with Rocephin and Zithromax therapy                    - Chest physiotherapy with VEST                    - PT/OT when able   2. Non-massive hemoptysis              - nebulized TXA with RT q8h x1d              - antibiotics as per CAP regiment above              - steroid therapy s/p solumedrol now transitioned to prednisone              -patient may need bronchoscopy will  monitor for now and discuss in am after non invasive treatment with tXA medication  3. Acute exacerbation of COPD          - agree with steroids and nebulizer therapy            - continue Brovana nebulizer BID and Yuperli once daily  4. Bibasilar Atelectasis               - VEST therapy for chest physiotherapy               - incentive spirometry and flutter valve               - PT/OT  5. Tobacco dependence            Nicotine replacement therapy    6. Physical deconditioning with CKD and CHF           Contributing to dyspnea - patient would benefit from pulmonary and cardiac rehab on outpatient            -patient is DNR and DNI with overall poor prognosis            - BODE score >8 with <20% 5 year survival          Thank you for allowing me to participate in the care of this patient.   Patient/Family are satisfied with care plan and all questions have been answered.    Provider disclosure: Patient with at least one acute or chronic illness or injury that poses a threat to life or bodily function and is being managed actively during this encounter.  All of the below services have been performed independently by signing provider:  review of prior documentation from internal and or external health records.  Review of previous and current lab results.  Interview and comprehensive assessment during patient visit today. Review of current and previous chest radiographs/CT scans. Discussion of management and test interpretation with health care team and patient/family.   This document was prepared using Dragon voice recognition software and may include unintentional dictation errors.     Vida Rigger, M.D.  Division of Pulmonary & Critical Care Medicine

## 2023-03-18 ENCOUNTER — Inpatient Hospital Stay: Payer: Medicare Other

## 2023-03-18 ENCOUNTER — Inpatient Hospital Stay: Payer: Medicare Other | Admitting: Anesthesiology

## 2023-03-18 ENCOUNTER — Inpatient Hospital Stay: Payer: Medicare Other | Attending: Oncology

## 2023-03-18 ENCOUNTER — Encounter
Admission: EM | Disposition: A | Discharge status: Home Health | Payer: Self-pay | Source: Home / Self Care | Attending: Internal Medicine

## 2023-03-18 DIAGNOSIS — D649 Anemia, unspecified: Secondary | ICD-10-CM

## 2023-03-18 DIAGNOSIS — J9601 Acute respiratory failure with hypoxia: Secondary | ICD-10-CM

## 2023-03-18 DIAGNOSIS — Z66 Do not resuscitate: Secondary | ICD-10-CM

## 2023-03-18 DIAGNOSIS — J441 Chronic obstructive pulmonary disease with (acute) exacerbation: Secondary | ICD-10-CM | POA: Diagnosis not present

## 2023-03-18 DIAGNOSIS — J189 Pneumonia, unspecified organism: Secondary | ICD-10-CM | POA: Diagnosis not present

## 2023-03-18 DIAGNOSIS — R634 Abnormal weight loss: Secondary | ICD-10-CM | POA: Insufficient documentation

## 2023-03-18 DIAGNOSIS — R109 Unspecified abdominal pain: Secondary | ICD-10-CM | POA: Insufficient documentation

## 2023-03-18 DIAGNOSIS — Z7189 Other specified counseling: Secondary | ICD-10-CM | POA: Diagnosis not present

## 2023-03-18 DIAGNOSIS — R110 Nausea: Secondary | ICD-10-CM | POA: Insufficient documentation

## 2023-03-18 DIAGNOSIS — Z515 Encounter for palliative care: Secondary | ICD-10-CM

## 2023-03-18 DIAGNOSIS — D472 Monoclonal gammopathy: Secondary | ICD-10-CM | POA: Diagnosis not present

## 2023-03-18 DIAGNOSIS — Z85820 Personal history of malignant melanoma of skin: Secondary | ICD-10-CM | POA: Insufficient documentation

## 2023-03-18 DIAGNOSIS — N1831 Chronic kidney disease, stage 3a: Secondary | ICD-10-CM | POA: Diagnosis not present

## 2023-03-18 DIAGNOSIS — F17210 Nicotine dependence, cigarettes, uncomplicated: Secondary | ICD-10-CM | POA: Insufficient documentation

## 2023-03-18 DIAGNOSIS — Z9049 Acquired absence of other specified parts of digestive tract: Secondary | ICD-10-CM | POA: Insufficient documentation

## 2023-03-18 DIAGNOSIS — G8929 Other chronic pain: Secondary | ICD-10-CM | POA: Insufficient documentation

## 2023-03-18 DIAGNOSIS — Z801 Family history of malignant neoplasm of trachea, bronchus and lung: Secondary | ICD-10-CM | POA: Insufficient documentation

## 2023-03-18 HISTORY — PX: FLEXIBLE BRONCHOSCOPY: SHX5094

## 2023-03-18 HISTORY — PX: FIBEROPTIC BRONCHOSCOPY: SHX5367

## 2023-03-18 LAB — GLUCOSE, CAPILLARY
Glucose-Capillary: 103 mg/dL — ABNORMAL HIGH (ref 70–99)
Glucose-Capillary: 100 mg/dL — ABNORMAL HIGH (ref 70–99)
Glucose-Capillary: 138 mg/dL — ABNORMAL HIGH (ref 70–99)
Glucose-Capillary: 197 mg/dL — ABNORMAL HIGH (ref 70–99)

## 2023-03-18 LAB — BASIC METABOLIC PANEL
Anion gap: 6 (ref 5–15)
BUN: 19 mg/dL (ref 8–23)
CO2: 27 mmol/L (ref 22–32)
Calcium: 9 mg/dL (ref 8.9–10.3)
Chloride: 105 mmol/L (ref 98–111)
Creatinine, Ser: 0.96 mg/dL (ref 0.61–1.24)
GFR, Estimated: >60 mL/min (ref >60)
Glucose, Bld: 102 mg/dL — ABNORMAL HIGH (ref 70–99)
Potassium: 3.8 mmol/L (ref 3.5–5.1)
Sodium: 138 mmol/L (ref 135–145)

## 2023-03-18 LAB — CBC
HCT: 30.6 % — ABNORMAL LOW (ref 39.0–52.0)
Hemoglobin: 10.1 g/dL — ABNORMAL LOW (ref 13.0–17.0)
MCH: 30.1 pg (ref 26.0–34.0)
MCHC: 33 g/dL (ref 30.0–36.0)
MCV: 91.1 fL (ref 80.0–100.0)
Platelets: 185 10*3/uL (ref 150–400)
RBC: 3.36 MIL/uL — ABNORMAL LOW (ref 4.22–5.81)
RDW: 14.5 % (ref 11.5–15.5)
WBC: 12.7 10*3/uL — ABNORMAL HIGH (ref 4.0–10.5)
nRBC: 0 % (ref 0.0–0.2)

## 2023-03-18 SURGERY — BRONCHOSCOPY, FLEXIBLE
Anesthesia: General | Laterality: Bilateral

## 2023-03-18 MED ORDER — LIDOCAINE HCL (CARDIAC) PF 100 MG/5ML IV SOSY
PREFILLED_SYRINGE | INTRAVENOUS | Status: DC | PRN
  Administered 2023-03-18: 60 mg via INTRAVENOUS

## 2023-03-18 MED ORDER — PROPOFOL 10 MG/ML IV BOLUS
INTRAVENOUS | Status: DC | PRN
  Administered 2023-03-18: 150 mg via INTRAVENOUS

## 2023-03-18 MED ORDER — SEVOFLURANE IN SOLN
RESPIRATORY_TRACT | Status: AC
  Filled 2023-03-18: qty 250

## 2023-03-18 MED ORDER — PHENYLEPHRINE 80 MCG/ML (10ML) SYRINGE FOR IV PUSH (FOR BLOOD PRESSURE SUPPORT)
PREFILLED_SYRINGE | INTRAVENOUS | Status: DC | PRN
  Administered 2023-03-18: 160 ug via INTRAVENOUS

## 2023-03-18 MED ORDER — OXYCODONE HCL 5 MG/5ML PO SOLN: 5.0000 mg | Freq: Once | ORAL | Status: DC | PRN

## 2023-03-18 MED ORDER — SODIUM CHLORIDE 0.9 % IV SOLN: INTRAVENOUS | Status: DC | PRN

## 2023-03-18 MED ORDER — FENTANYL CITRATE (PF) 100 MCG/2ML IJ SOLN
INTRAMUSCULAR | Status: DC | PRN
  Administered 2023-03-18: 25 ug via INTRAVENOUS

## 2023-03-18 MED ORDER — IPRATROPIUM-ALBUTEROL 0.5-2.5 (3) MG/3ML IN SOLN
RESPIRATORY_TRACT | Status: AC
  Filled 2023-03-18: qty 3

## 2023-03-18 MED ORDER — PROPOFOL 10 MG/ML IV BOLUS
INTRAVENOUS | Status: AC
  Filled 2023-03-18: qty 20

## 2023-03-18 MED ORDER — ONDANSETRON HCL 4 MG/2ML IJ SOLN: 4.0000 mg | Freq: Once | INTRAMUSCULAR | Status: DC | PRN

## 2023-03-18 MED ORDER — EPHEDRINE SULFATE-NACL 50-0.9 MG/10ML-% IV SOSY
PREFILLED_SYRINGE | INTRAVENOUS | Status: DC | PRN
  Administered 2023-03-18: 5 mg via INTRAVENOUS

## 2023-03-18 MED ORDER — FENTANYL CITRATE (PF) 100 MCG/2ML IJ SOLN
INTRAMUSCULAR | Status: AC
  Filled 2023-03-18: qty 2

## 2023-03-18 MED ORDER — AZITHROMYCIN 250 MG PO TABS
500.0000 mg | ORAL_TABLET | Freq: Every day | ORAL | Status: DC
  Administered 2023-03-18: 500 mg via ORAL
  Filled 2023-03-18: qty 2

## 2023-03-18 MED ORDER — FENTANYL CITRATE (PF) 100 MCG/2ML IJ SOLN: 25.0000 ug | INTRAMUSCULAR | Status: DC | PRN

## 2023-03-18 MED ORDER — SUCCINYLCHOLINE CHLORIDE 200 MG/10ML IV SOSY
PREFILLED_SYRINGE | INTRAVENOUS | Status: DC | PRN
  Administered 2023-03-18: 60 mg via INTRAVENOUS

## 2023-03-18 MED ORDER — IPRATROPIUM-ALBUTEROL 0.5-2.5 (3) MG/3ML IN SOLN
3.0000 mL | RESPIRATORY_TRACT | Status: DC
  Administered 2023-03-18: 3 mL via RESPIRATORY_TRACT

## 2023-03-18 MED ORDER — OXYCODONE HCL 5 MG PO TABS: 5.0000 mg | ORAL_TABLET | Freq: Once | ORAL | Status: DC | PRN

## 2023-03-18 MED ORDER — ACETAMINOPHEN 10 MG/ML IV SOLN: 1000.0000 mg | Freq: Once | INTRAVENOUS | Status: DC | PRN

## 2023-03-18 NOTE — Plan of Care (Signed)
  Problem: Clinical Measurements: Goal: Ability to maintain a body temperature in the normal range will improve Outcome: Progressing   

## 2023-03-18 NOTE — Progress Notes (Deleted)
Mobility Specialist - Progress Note   03/18/23 0955  Mobility  Activity Ambulated with assistance in room;Transferred from bed to chair  Level of Assistance Contact guard assist, steadying assist  Assistive Device None  Distance Ambulated (ft) 6 ft  Activity Response Tolerated well  Mobility visit 1 Mobility  Mobility Specialist Start Time (ACUTE ONLY) [id number]  Mobility Specialist Stop Time (ACUTE ONLY) 0950  Mobility Specialist Time Calculation (min) (ACUTE ONLY) 8 min   Pt supine upon entry, utilizing RA. Pt denied OOB amb this date, however agreeable to transfer to the recliner for breakfast. Pt completed bed mob indep, STS to RW MinG and and amb to the recliner CGA with no AD (per Pt request), holding on to the railing of the recliner during transfer. Pt left seated in the recliner with needs within reach, RN notified.  Zetta Bills Mobility Specialist 03/18/23 9:59 AM

## 2023-03-18 NOTE — Progress Notes (Signed)
PHARMACIST - PHYSICIAN COMMUNICATION  CONCERNING: Antibiotic IV to Oral Route Change Policy  RECOMMENDATION: This patient is receiving azithromycin by the intravenous route.  Based on criteria approved by the Pharmacy and Therapeutics Committee, the antibiotic(s) is/are being converted to the equivalent oral dose form(s).  DESCRIPTION: These criteria include: Patient being treated for a respiratory tract infection, urinary tract infection, cellulitis or clostridium difficile associated diarrhea if on metronidazole The patient is not neutropenic and does not exhibit a GI malabsorption state The patient is eating (either orally or via tube) and/or has been taking other orally administered medications for a least 24 hours The patient is improving clinically and has a Tmax < 100.5  If you have questions about this conversion, please contact the Pharmacy Department   Tressie Ellis 03/18/23

## 2023-03-18 NOTE — Anesthesia Preprocedure Evaluation (Signed)
Anesthesia Evaluation  Patient identified by MRN, date of birth, ID band Patient awake    Reviewed: Allergy & Precautions, NPO status , Patient's Chart, lab work & pertinent test results  History of Anesthesia Complications Negative for: history of anesthetic complications  Airway Mallampati: III  TM Distance: >3 FB Neck ROM: Full    Dental  (+) Chipped   Pulmonary shortness of breath and at rest, neg sleep apnea, pneumonia, unresolved, COPD, Current Smoker and Patient abstained from smoking. Not on home oxygen.  Presented to hospital with hemoptysis and pneumonia.   + rhonchi  + decreased breath sounds      Cardiovascular Exercise Tolerance: Poor METShypertension, + CAD, + Past MI, + CABG, + Peripheral Vascular Disease and +CHF  (-) dysrhythmias  Rhythm:Regular Rate:Normal - Systolic murmurs TTE 2022  1. Left ventricular ejection fraction, by estimation, is 45 to 50%. The  left ventricle has mildly decreased function. The left ventricle  demonstrates global hypokinesis. There is mild left ventricular  hypertrophy of the basal-septal segment. Left  ventricular diastolic parameters are consistent with Grade I diastolic  dysfunction (impaired relaxation). Elevated left ventricular end-diastolic  pressure.   2. Right ventricular systolic function is normal. The right ventricular  size is normal.   3. The mitral valve is normal in structure. Mild mitral valve  regurgitation. No evidence of mitral stenosis.   4. The aortic valve is normal in structure. Aortic valve regurgitation is  not visualized. No aortic stenosis is present.   5. The inferior vena cava is normal in size with greater than 50%  respiratory variability, suggesting right atrial pressure of 3 mmHg.   6. Evidence of atrial level shunting detected by color flow Doppler.  There is a small patent foramen ovale.     Neuro/Psych  PSYCHIATRIC DISORDERS Anxiety  Depression       GI/Hepatic ,neg GERD  ,,(+)     (-) substance abuse    Endo/Other  diabetes    Renal/GU CRFRenal disease     Musculoskeletal   Abdominal   Peds  Hematology   Anesthesia Other Findings Past Medical History: No date: Anxiety No date: Arthritis No date: CHF (congestive heart failure) (HCC) No date: Colon polyp No date: COPD (chronic obstructive pulmonary disease) (HCC)     Comment:  smoker No date: Dysrhythmia No date: Glaucoma No date: Heart murmur No date: Hypertension     Comment:  on  no meds 11/18/2020: Low serum vitamin B12 No date: Melanoma (HCC)     Comment:  Resected from Left upper arm.  2017: Myocardial infarction (HCC) No date: Peripheral vascular disease (HCC) No date: Prostate enlargement  Reproductive/Obstetrics                             Anesthesia Physical Anesthesia Plan  ASA: 4  Anesthesia Plan: General   Post-op Pain Management: Ofirmev IV (intra-op)*   Induction: Intravenous  PONV Risk Score and Plan: 1 and Ondansetron, Dexamethasone and Treatment may vary due to age or medical condition  Airway Management Planned: Oral ETT and Video Laryngoscope Planned  Additional Equipment: None  Intra-op Plan:   Post-operative Plan: Extubation in OR  Informed Consent: I have reviewed the patients History and Physical, chart, labs and discussed the procedure including the risks, benefits and alternatives for the proposed anesthesia with the patient or authorized representative who has indicated his/her understanding and acceptance.     Dental advisory given  Plan  Discussed with: CRNA and Surgeon  Anesthesia Plan Comments: (Discussed risks of anesthesia with patient, including PONV, sore throat, lip/dental/eye damage. Rare risks discussed as well, such as cardiorespiratory and neurological sequelae, and allergic reactions. Discussed the role of CRNA in patient's perioperative care. Patient understands.)        Anesthesia Quick Evaluation

## 2023-03-18 NOTE — Progress Notes (Signed)
Mobility Specialist - Progress Note  During mobility: HR 88, SpO2 89%   03/18/23 1026  Mobility  Activity Transferred to/from [facility];Ambulated with assistance in room;Transferred from bed to chair  Level of Assistance Standby assist, set-up cues, supervision of patient - no hands on  Assistive Device None  Distance Ambulated (ft) 15 ft  Activity Response Tolerated well  Mobility visit 1 Mobility  Mobility Specialist Start Time (ACUTE ONLY) 1005  Mobility Specialist Stop Time (ACUTE ONLY) 1030  Mobility Specialist Time Calculation (min) (ACUTE ONLY) 25 min   Pt supine upon entry, utilizing 2L. Pt agreeable to activity, requesting to use the bathroom. Pt completed bed mob indep, STS from EOB and amb to the [facility] to void with no AD and MinG-SBA. Pt reported feeling SOB while seated on the BSC-- O2 94% HR 96. Pt STS and amb around the bed to the recliner MinG-SBA--furniture cruising during transfer. Pt desat to 89% upon sitting in the recliner, RN notified. Pt left seated in the recliner with alarm set and needs within reach.  Zetta Bills Mobility Specialist 03/18/23 10:41 AM

## 2023-03-18 NOTE — Consult Note (Signed)
 Consultation Note Date: 03/18/2023   Patient Name: Todd Whitehead  DOB: 1945/07/02  MRN: [id number]  Age / Sex: 78 y.o., male  PCP: Lauro Regulus, MD Referring Physician: Delfino Lovett, MD  Reason for Consultation: Establishing goals of care  HPI/Patient Profile: 78 y.o. male  with past medical history of hypertension, hyperlipidemia, COPD not on home oxygen, depression with anxiety, BPH, PVD, CAD, CABG, CHF with EF of 45-50% in 12/2020, CKD-3, type II diabetes mellitus, tobacco use disorder, anemia, hx of melanoma, MGUS, and carotid artery stenosis admitted on 03/15/2023 with dyspnea, wheezing, and hemoptysis. Patient diagnosed with COPD exacerbation and CAP.  PMT consulted to discuss goals of care with patient.  Planning for bronchoscopy 2/17.  Clinical Assessment and Goals of Care: I have reviewed medical records including EPIC notes, labs and imaging, received report from RN, assessed the patient and then met with patient to discuss diagnosis prognosis, GOC, EOL wishes, disposition and options.  I introduced Palliative Medicine as specialized medical care for people living with serious illness. It focuses on providing relief from the symptoms and stress of a serious illness. The goal is to improve quality of life for both the patient and the family.  Patient tells me he lives alone.  He is divorced.  His ex-wife passed away a few years ago after having COVID.  He has 2 children [place] and [place] who live locally.  Patient tells me at home he was independent.  He did not use a cane or walker.  He had a good appetite but despite that had lost some weight.  He tells me he was frequently short of breath at home and required frequent rest breaks with activity.  He tells me he was able to walk about half a mile although had to pause multiple times throughout this walk.  We reviewed that today he is requiring 2 L of oxygen.  He is hopeful not to  need this for a long period he tells me his family has arranged for his niece to come and stay with him when he is discharged.  He is also hopeful this is a short-term arrangement.   We discussed patient's current illness and what it means in the larger context of patient's on-going co-morbidities.  I attempted to elicit values and goals of care important to the patient.    The difference between aggressive medical intervention and comfort care was considered in light of the patient's goals of care.   Patient is agreeable to current level of care.  He agrees to proceed with bronchoscopy today and is hopeful for improvement in his symptoms.  He tells me if he got to the point where he is bed ridden he would not consider this good quality of life and would not want to continue aggressive medical care to prolong this.  Patient tells me he has made himself DNR/DNI.  He tells me he has discussed his medical wishes with his son and daughter.  They are aware of what he would consider good quality of life.  They are his next of kin and they are who he would want to serve as decision-makers together if he were unable to make decisions for himself.  Discussed with patient the importance of continued conversation with family and the medical providers regarding overall plan of care and treatment options, ensuring decisions are within the context of the patient's values and GOCs.    Questions and concerns were addressed. The family was encouraged to call with  questions or concerns.  Primary Decision Maker PATIENT    SUMMARY OF RECOMMENDATIONS   -DNR/DNI -Plans for bronchoscopy today which patient is agreeable to -Patient has declined home health but family has arranged for his niece to come and stay with him when he is discharged -Next of kin is son and daughter and that is who patient would want to service decision makers if he were unable, they are aware of his medical wishes -PMT will follow  Code  Status/Advance Care Planning: DNR      Primary Diagnoses: Present on Admission:  CAP (community acquired pneumonia)  Anemia  Tobacco abuse  MGUS (monoclonal gammopathy of unknown significance)  CKD (chronic kidney disease) stage 3, GFR 30-59 ml/min (HCC)  Hypertension  Hyperlipidemia  Depression with anxiety  CAD (coronary artery disease)   I have reviewed the medical record, interviewed the patient and family, and examined the patient. The following aspects are pertinent.  Past Medical History:  Diagnosis Date   Anxiety    Arthritis    CHF (congestive heart failure) (HCC)    Colon polyp    COPD (chronic obstructive pulmonary disease) (HCC)    smoker   Dysrhythmia    Glaucoma    Heart murmur    Hypertension    on  no meds   Low serum vitamin B12 11/18/2020   Melanoma (HCC)    Resected from Left upper arm.    Myocardial infarction [facility]) 2017   Peripheral vascular disease (HCC)    Prostate enlargement    Social History   Socioeconomic History   Marital status: Divorced    Spouse name: Not on file   Number of children: Not on file   Years of education: Not on file   Highest education level: Not on file  Occupational History   Not on file  Tobacco Use   Smoking status: Every Day    Current packs/day: 1.00    Average packs/day: 1 pack/day for 58.0 years (58.0 ttl pk-yrs)    Types: Cigarettes   Smokeless tobacco: Never  Vaping Use   Vaping status: Former  Substance and Sexual Activity   Alcohol use: No   Drug use: No   Sexual activity: Not Currently  Other Topics Concern   Not on file  Social History Narrative   Not on file   Social Drivers of Health   Financial Resource Strain: Not on file  Food Insecurity: No Food Insecurity (03/16/2023)   Hunger Vital Sign    Worried About Running Out of Food in the Last Year: Never true    Ran Out of Food in the Last Year: Never true  Transportation Needs: No Transportation Needs (03/16/2023)   PRAPARE -  Administrator, Civil Service (Medical): No    Lack of Transportation (Non-Medical): No  Physical Activity: Not on file  Stress: Not on file  Social Connections: Moderately Integrated (03/16/2023)   Social Connection and Isolation Panel [NHANES]    Frequency of Communication with Friends and Family: Three times a week    Frequency of Social Gatherings with Friends and Family: Once a week    Attends Religious Services: More than 4 times per year    Active Member of [employer] or Organizations: Yes    Attends Banker Meetings: Never    Marital Status: Divorced   Family History  Problem Relation Age of Onset   Heart attack Father    Aneurysm Brother    Lung cancer Brother  Scheduled Meds:  arformoterol  15 mcg Nebulization BID   atorvastatin  40 mg Oral Daily   brimonidine  1 drop Both Eyes BID   busPIRone  30 mg Oral BID   citalopram  20 mg Oral Daily   guaiFENesin  600 mg Oral BID   insulin aspart  0-5 Units Subcutaneous QHS   insulin aspart  0-9 Units Subcutaneous TID WC   latanoprost  1 drop Both Eyes QHS   metoprolol succinate  12.5 mg Oral Daily   nicotine  21 mg Transdermal Daily   predniSONE  40 mg Oral Q breakfast   QUEtiapine  50 mg Oral QHS   revefenacin  175 mcg Nebulization Daily   sacubitril-valsartan  1 tablet Oral BID   sodium chloride HYPERTONIC  4 mL Nebulization BID   tamsulosin  0.4 mg Oral QPC breakfast   tranexamic acid  500 mg Nebulization Q8H   Continuous Infusions:  azithromycin 500 mg (03/18/23 0055)   cefTRIAXone (ROCEPHIN)  IV 2 g (03/18/23 0008)   PRN Meds:.acetaminophen **OR** acetaminophen, albuterol, ALPRAZolam, guaiFENesin-dextromethorphan, ondansetron **OR** ondansetron (ZOFRAN) IV, mouth rinse, traMADol Allergies  Allergen Reactions   Other Nausea And Vomiting    Cheese,butter,sour cream,cottage cheese   Poison Ivy Extract Rash   Poison Oak Extract Rash   Review of Systems  Constitutional:  Positive for activity  change and fatigue.  Respiratory:  Positive for cough and shortness of breath.   Neurological:  Positive for weakness.    Physical Exam Constitutional:      General: He is not in acute distress.    Appearance: He is ill-appearing.     Comments: cachectic  Pulmonary:     Effort: Pulmonary effort is normal.  Musculoskeletal:     Right lower leg: No edema.     Left lower leg: No edema.  Skin:    General: Skin is warm and dry.  Neurological:     Mental Status: He is alert and oriented to person, place, and time.  Psychiatric:        Mood and Affect: Mood normal.        Behavior: Behavior normal.     Vital Signs: BP (!) 145/68 (BP Location: Left Arm)   Pulse 80   Temp 97.6 F (36.4 C) (Oral)   Resp (!) 24   Ht 5\' 8"  (1.727 m)   Wt 52.2 kg   SpO2 94%   BMI 17.49 kg/m  Pain Scale: 0-10   Pain Score: 0-No pain   SpO2: SpO2: 94 % O2 Device:SpO2: 94 % O2 Flow Rate: .O2 Flow Rate (L/min): 2 L/min  IO: Intake/output summary:  Intake/Output Summary (Last 24 hours) at 03/18/2023 1056 Last data filed at 03/17/2023 2237 Gross per 24 hour  Intake 724.82 ml  Output 825 ml  Net -100.18 ml    LBM: Last BM Date : 03/17/23 Baseline Weight: Weight: 52.2 kg Most recent weight: Weight: 52.2 kg     Palliative Assessment/Data: PPS 50%     *Please note that this is a verbal dictation therefore any spelling or grammatical errors are due to the "[employer] One" system interpretation.   Time Total: 60 minutes Time spent includes: Detailed review of medical records (labs, imaging, vital signs), medically appropriate exam, discussion with treatment team, counseling and educating patient, family and/or staff, documenting clinical information, medication management and coordination of care.    Gerlean Ren, DNP, AGNP-C Palliative Medicine Team [phone] Pager: [phone]

## 2023-03-18 NOTE — Progress Notes (Signed)
PULMONOLOGY         Date: 03/18/2023,   MRN# [id number] Todd Whitehead Mar 02, 1945     AdmissionWeight: 52.2 kg                 CurrentWeight: 52.2 kg  Referring provider: Dr Sherryll Burger   CHIEF COMPLAINT:   Non-massive hemoptysis and mucus plugging of tracheobronchial tree   HISTORY OF PRESENT ILLNESS   This is a 78 year old male he has a history of essential hypertension, dyslipidemia lifelong smoking history centrilobular and paraseptal emphysema with COPD however has not been on oxygen in the past.  Also does have significant systolic CHF and coronary disease status post bypass as well as CKD.  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  Patient comes in with reports of progressive worsening dyspnea shortness of breath wheezing and hemoptysis.  While in the ER he was found to be borderline hypotensive with hypoxemia requiring supplemental oxygen, his SpO2 was 86%.  Blood work showed worsening anemia from a hemoglobin over 11 to now 10.4 on admission also hyponatremia and hyperglycemia.  He had an abnormal x-ray with left lung interstitial opacification.  He had a CT PE performed and this was negative for blood clots however did show infiltrate of the left upper lobe as well as mucous plugging and bronchitic changes bilaterally with previously seen centrilobular and paraseptal emphysema.  He was empirically treated for pneumonia with community-acquired pneumonia regimen including Rocephin and Zithromax, resuscitated with IV fluids and also treated with IV steroid Solu-Medrol.  PCCM consultation was placed for massive hemoptysis with mucous plugging of the tracheobronchial tree worse at the right middle lobe and right lower lobe.  03/18/23- Patient continues to have hemoptysis despite non invasive therapy. We discussed bronchoscopic airway inspection and he would prefer to have this done prior to discharge. We do not plan to have biopsies done. Patient is NPO plan for  2pm procedure today.    PAST MEDICAL HISTORY   Past Medical History:  Diagnosis Date   Anxiety    Arthritis    CHF (congestive heart failure) (HCC)    Colon polyp    COPD (chronic obstructive pulmonary disease) (HCC)    smoker   Dysrhythmia    Glaucoma    Heart murmur    Hypertension    on  no meds   Low serum vitamin B12 11/18/2020   Melanoma (HCC)    Resected from Left upper arm.    Myocardial infarction [facility]) 2017   Peripheral vascular disease [facility])    Prostate enlargement      SURGICAL HISTORY   Past Surgical History:  Procedure Laterality Date   BACK SURGERY  01/19/98   CARDIAC CATHETERIZATION N/A 08/19/2015   Procedure: Left Heart Cath and Coronary Angiography;  Surgeon: Dalia Heading, MD;  Location: [facility] INVASIVE CV LAB;  Service: Cardiovascular;  Laterality: N/A;   CHOLECYSTECTOMY N/A 08/19/2016   Procedure: LAPAROSCOPIC CHOLECYSTECTOMY WITH INTRAOPERATIVE CHOLANGIOGRAM;  Surgeon: Almond Lint, MD;  Location: [facility] OR;  Service: General;  Laterality: N/A;   COLON SURGERY  age 86 days old   " bowel was twisted up"   COLONOSCOPY WITH PROPOFOL N/A 03/19/2016   Procedure: COLONOSCOPY WITH PROPOFOL;  Surgeon: Christena Deem, MD;  Location: [facility] ENDOSCOPY;  Service: Endoscopy;  Laterality: N/A;   CORONARY ARTERY BYPASS GRAFT  2017   ERCP N/A 08/18/2016   Procedure: ENDOSCOPIC RETROGRADE CHOLANGIOPANCREATOGRAPHY (ERCP);  Surgeon: Kerin Salen, MD;  Location: [facility] ENDOSCOPY;  Service: Gastroenterology;  Laterality: N/A;   ESOPHAGOGASTRODUODENOSCOPY (EGD) WITH PROPOFOL N/A 09/18/2018   Procedure: ESOPHAGOGASTRODUODENOSCOPY (EGD) WITH PROPOFOL;  Surgeon: Sung Amabile, DO;  Location: [facility] ENDOSCOPY;  Service: General;  Laterality: N/A;   EYE SURGERY Left 03/26/11   LOWER EXTREMITY ANGIOGRAPHY Left 10/03/2017   Procedure: LOWER EXTREMITY ANGIOGRAPHY;  Surgeon: Annice Needy, MD;  Location: [facility] INVASIVE CV LAB;  Service: Cardiovascular;  Laterality: Left;   LOWER EXTREMITY  ANGIOGRAPHY Right 10/31/2017   Procedure: LOWER EXTREMITY ANGIOGRAPHY;  Surgeon: Annice Needy, MD;  Location: [facility] INVASIVE CV LAB;  Service: Cardiovascular;  Laterality: Right;   LUMBAR LAMINECTOMY/DECOMPRESSION MICRODISCECTOMY Right 10/02/2012   Procedure: Right Lumbar Five-Sacral One Lumbar laminotomy/Microdiskectomy;  Surgeon: Hewitt Shorts, MD;  Location: [facility] NEURO ORS;  Service: Neurosurgery;  Laterality: Right;  Right Lumbar Five-Sacral One Lumbar laminotomy/Microdiskectomy   VISCERAL ANGIOGRAPHY N/A 11/08/2020   Procedure: VISCERAL ANGIOGRAPHY;  Surgeon: Renford Dills, MD;  Location: [facility] INVASIVE CV LAB;  Service: Cardiovascular;  Laterality: N/A;   VISCERAL ANGIOGRAPHY N/A 11/25/2020   Procedure: VISCERAL ANGIOGRAPHY;  Surgeon: Renford Dills, MD;  Location: [facility] INVASIVE CV LAB;  Service: Cardiovascular;  Laterality: N/A;     FAMILY HISTORY   Family History  Problem Relation Age of Onset   Heart attack Father    Aneurysm Brother    Lung cancer Brother      SOCIAL HISTORY   Social History   Tobacco Use   Smoking status: Every Day    Current packs/day: 1.00    Average packs/day: 1 pack/day for 58.0 years (58.0 ttl pk-yrs)    Types: Cigarettes   Smokeless tobacco: Never  Vaping Use   Vaping status: Former  Substance Use Topics   Alcohol use: No   Drug use: No     MEDICATIONS    Home Medication:    Current Medication:  Current Facility-Administered Medications:    acetaminophen (TYLENOL) tablet 650 mg, 650 mg, Oral, Q6H PRN, 650 mg at 03/16/23 2243 **OR** acetaminophen (TYLENOL) suppository 650 mg, 650 mg, Rectal, Q6H PRN, Andris Baumann, MD   albuterol (PROVENTIL) (2.5 MG/3ML) 0.083% nebulizer solution 2.5 mg, 2.5 mg, Nebulization, Q4H PRN, Foust, Katy L, NP, 2.5 mg at 03/16/23 1318   ALPRAZolam (XANAX) tablet 1 mg, 1 mg, Oral, QHS PRN, Delfino Lovett, MD, 1 mg at 03/17/23 2108   arformoterol (BROVANA) nebulizer solution 15 mcg, 15 mcg, Nebulization,  BID, Delfino Lovett, MD, 15 mcg at 03/18/23 0745   atorvastatin (LIPITOR) tablet 40 mg, 40 mg, Oral, Daily, Foust, Katy L, NP, 40 mg at 03/17/23 0825   azithromycin (ZITHROMAX) 500 mg in sodium chloride 0.9 % 250 mL IVPB, 500 mg, Intravenous, Q24H, Foust, Katy L, NP, Last Rate: 250 mL/hr at 03/18/23 0055, 500 mg at 03/18/23 0055   brimonidine (ALPHAGAN) 0.15 % ophthalmic solution 1 drop, 1 drop, Both Eyes, BID, Foust, Katy L, NP, 1 drop at 03/17/23 2107   busPIRone (BUSPAR) tablet 30 mg, 30 mg, Oral, BID, Foust, Katy L, NP, 30 mg at 03/17/23 2108   cefTRIAXone (ROCEPHIN) 2 g in sodium chloride 0.9 % 100 mL IVPB, 2 g, Intravenous, Q24H, Foust, Katy L, NP, Last Rate: 200 mL/hr at 03/18/23 0008, 2 g at 03/18/23 0008   citalopram (CELEXA) tablet 20 mg, 20 mg, Oral, Daily, Foust, Katy L, NP, 20 mg at 03/17/23 0826   guaiFENesin (MUCINEX) 12 hr tablet 600 mg, 600 mg, Oral, BID, Foust, Katy L, NP, 600 mg at 03/17/23 2108  guaiFENesin-dextromethorphan (ROBITUSSIN DM) 100-10 MG/5ML syrup 5 mL, 5 mL, Oral, Q4H PRN, Andris Baumann, MD, 5 mL at 03/16/23 1318   insulin aspart (novoLOG) injection 0-5 Units, 0-5 Units, Subcutaneous, QHS, Para March, Odetta Pink, MD   insulin aspart (novoLOG) injection 0-9 Units, 0-9 Units, Subcutaneous, TID WC, Andris Baumann, MD, 2 Units at 03/17/23 1808   latanoprost (XALATAN) 0.005 % ophthalmic solution 1 drop, 1 drop, Both Eyes, QHS, Foust, Katy L, NP, 1 drop at 03/17/23 2107   metoprolol succinate (TOPROL-XL) 24 hr tablet 12.5 mg, 12.5 mg, Oral, Daily, Foust, Katy L, NP, 12.5 mg at 03/17/23 0825   nicotine (NICODERM CQ - dosed in mg/24 hours) patch 21 mg, 21 mg, Transdermal, Daily, Sherryll Burger, Vipul, MD, 21 mg at 03/17/23 0830   ondansetron (ZOFRAN) tablet 4 mg, 4 mg, Oral, Q6H PRN **OR** ondansetron (ZOFRAN) injection 4 mg, 4 mg, Intravenous, Q6H PRN, Andris Baumann, MD   Oral care mouth rinse, 15 mL, Mouth Rinse, PRN, Sherryll Burger, Vipul, MD   predniSONE (DELTASONE) tablet 40 mg, 40 mg, Oral, Q  breakfast, Foust, Katy L, NP, 40 mg at 03/17/23 0825   QUEtiapine (SEROQUEL) tablet 50 mg, 50 mg, Oral, QHS, Foust, Katy L, NP, 50 mg at 03/17/23 2108   revefenacin (YUPELRI) nebulizer solution 175 mcg, 175 mcg, Nebulization, Daily, Delfino Lovett, MD, 175 mcg at 03/18/23 0745   sacubitril-valsartan (ENTRESTO) 49-51 mg per tablet, 1 tablet, Oral, BID, Foust, Katy L, NP, 1 tablet at 03/17/23 2108   sodium chloride HYPERTONIC 3 % nebulizer solution 4 mL, 4 mL, Nebulization, BID, Sherryll Burger, Vipul, MD, 4 mL at 03/18/23 0745   tamsulosin (FLOMAX) capsule 0.4 mg, 0.4 mg, Oral, QPC breakfast, Foust, Katy L, NP, 0.4 mg at 03/17/23 0825   traMADol (ULTRAM) tablet 25 mg, 25 mg, Oral, TID PRN, Delfino Lovett, MD   tranexamic acid (CYKLOKAPRON) 1000 MG/10ML nebulizer solution 500 mg, 500 mg, Nebulization, Q8H, Shama Monfils, MD, 500 mg at 03/17/23 2132    ALLERGIES   Other, Poison ivy extract, and Poison oak extract     REVIEW OF SYSTEMS    Review of Systems:  Gen:  Denies  fever, sweats, chills weigh loss  HEENT: Denies blurred vision, double vision, ear pain, eye pain, hearing loss, nose bleeds, sore throat Cardiac:  No dizziness, chest pain or heaviness, chest tightness,edema Resp:   reports dyspnea chronically  Gi: Denies swallowing difficulty, stomach pain, nausea or vomiting, diarrhea, constipation, bowel incontinence Gu:  Denies bladder incontinence, burning urine Ext:   Denies Joint pain, stiffness or swelling Skin: Denies  skin rash, easy bruising or bleeding or hives Endoc:  Denies polyuria, polydipsia , polyphagia or weight change Psych:   Denies depression, insomnia or hallucinations   Other:  All other systems negative   VS: BP (!) 145/68 (BP Location: Left Arm)   Pulse 80   Temp 97.6 F (36.4 C) (Oral)   Resp (!) 24   Ht 5\' 8"  (1.727 m)   Wt 52.2 kg   SpO2 94%   BMI 17.49 kg/m      PHYSICAL EXAM    GENERAL:NAD, no fevers, chills, no weakness no fatigue HEAD:  Normocephalic, atraumatic.  EYES: Pupils equal, round, reactive to light. Extraocular muscles intact. No scleral icterus.  MOUTH: Moist mucosal membrane. Dentition intact. No abscess noted.  EAR, NOSE, THROAT: Clear without exudates. No external lesions.  NECK: Supple. No thyromegaly. No nodules. No JVD.  PULMONARY: decreased breath sounds with mild rhonchi worse at bases bilaterally.  CARDIOVASCULAR: S1 and S2. Regular rate and rhythm. No murmurs, rubs, or gallops. No edema. Pedal pulses 2+ bilaterally.  GASTROINTESTINAL: Soft, nontender, nondistended. No masses. Positive bowel sounds. No hepatosplenomegaly.  MUSCULOSKELETAL: No swelling, clubbing, or edema. Range of motion full in all extremities.  NEUROLOGIC: Cranial nerves II through XII are intact. No gross focal neurological deficits. Sensation intact. Reflexes intact.  SKIN: No ulceration, lesions, rashes, or cyanosis. Skin warm and dry. Turgor intact.  PSYCHIATRIC: Mood, affect within normal limits. The patient is awake, alert and oriented x 3. Insight, judgment intact.       IMAGING     Narrative & Impression  CLINICAL DATA:  Hemoptysis.   EXAM: CT ANGIOGRAPHY CHEST WITH CONTRAST   TECHNIQUE: Multidetector CT imaging of the chest was performed using the standard protocol during bolus administration of intravenous contrast. Multiplanar CT image reconstructions and MIPs were obtained to evaluate the vascular anatomy.   RADIATION DOSE REDUCTION: This exam was performed according to the departmental dose-optimization program which includes automated exposure control, adjustment of the mA and/or kV according to patient size and/or use of iterative reconstruction technique.   CONTRAST:  60mL OMNIPAQUE IOHEXOL 350 MG/ML SOLN   COMPARISON:  Radiograph earlier today.  Chest CT 04/26/2021   FINDINGS: Cardiovascular: There are no filling defects within the pulmonary arteries to suggest pulmonary embolus. The heart is normal  in size. There is contrast refluxing into the hepatic veins and IVC. Calcification of native coronary arteries, post median sternotomy. Aortic atherosclerosis without aneurysm. Celiac stent.   Mediastinum/Nodes: Small mediastinal and hilar lymph nodes, not enlarged by size criteria. Unremarkable appearance of the esophagus. No thyroid nodule.   Lungs/Pleura: Patchy and consolidative left upper lobe opacity with areas of mucoid impaction and bronchial filling. Additional areas of mucoid impaction within the right middle and lower lobe. 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 Areas central bronchial thickening. There is biapical pleuroparenchymal scarring. Bandlike atelectasis or no pleural fluid. Scarring in the right lower lobe.   Upper Abdomen: Contrast refluxes into the hepatic veins and IVC. Detailed assessment is limited due to paucity of intra-abdominal fat.   Musculoskeletal: Scoliosis and mild degenerative change in the spine. Prior median sternotomy. There are no acute or suspicious osseous abnormalities.   Review of the MIP images confirms the above findings.   IMPRESSION: 1. No pulmonary embolus. 2. Patchy and consolidative left upper lobe opacity with areas of mucoid impaction and bronchial filling, suspicious for pneumonia. Additional areas of mucoid impaction within the right middle and lower lobe. 3. Contrast refluxing into the hepatic veins and IVC, can be seen with right heart dysfunction. 4. 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   Aortic Atherosclerosis (ICD10-I70.0) and Emphysema (ICD10-J43.9).     Electronically Signed   By: Narda Rutherford M.D.   On: 03/15/2023 19:47     ASSESSMENT/PLAN   Acute on chronic hypoxemic respiratory failure                   - Left upper lobe community acquired pneumonia                      - agree with Rocephin and Zithromax therapy                    - Chest physiotherapy with VEST                     - PT/OT when able   2.  Non-massive hemoptysis              - nebulized TXA with RT q8h x1d              - antibiotics as per CAP regiment above              - steroid therapy s/p solumedrol now transitioned to prednisone              -patient may need bronchoscopy will monitor for now and discuss in am after non invasive treatment with tXA medication             -bronchoscopy today at 2pm for therapeutic aspiration of mucus plugging at left upper lobe, BAL with cytology and possible ice cold lavage with epinephrine for hemoptysis  3. Acute exacerbation of COPD          - continue with steroids and nebulizer therapy           - continue Brovana nebulizer BID and Yuperli once daily  4. Bibasilar Atelectasis               - VEST therapy for chest physiotherapy               - incentive spirometry and flutter valve               - PT/OT  5. Tobacco dependence            Nicotine replacement therapy    6. Physical deconditioning with CKD and CHF           Contributing to dyspnea - patient would benefit from pulmonary and cardiac rehab on outpatient            -patient is DNR and DNI with overall poor prognosis            - BODE score >8 with <20% 5 year survival          Thank you for allowing me to participate in the care of this patient.   Patient/Family are satisfied with care plan and all questions have been answered.    Provider disclosure: Patient with at least one acute or chronic illness or injury that poses a threat to life or bodily function and is being managed actively during this encounter.  All of the below services have been performed independently by signing provider:  review of prior documentation from internal and or external health records.  Review of previous and current lab results.  Interview and comprehensive assessment during patient visit today. Review of current and previous chest radiographs/CT scans. Discussion of management and test interpretation  with health care team and patient/family.   This document was prepared using Dragon voice recognition software and may include unintentional dictation errors.     Vida Rigger, M.D.  Division of Pulmonary & Critical Care Medicine

## 2023-03-18 NOTE — Procedures (Signed)
PROCEDURE: BRONCHOSCOPY Flexible bronchoscopy with Therapeutic Aspiration of Tracheobronchial Tree of multiple lobes.  Bronchoscopy with Bronchoalveolar lavage.  PROCEDURE DATE: 03/18/2023  TIME:  NAME:  Todd Whitehead  DOB:05/11/1945  MRN: [id number] LOC:  [facility]/None    HOSP DAY: @LENGTHOFSTAYDAYS @ CODE STATUS:      Code Status Orders  (From admission, onward)           Start     Ordered   03/16/23 0742  Do not attempt resuscitation (DNR)- Limited -Do Not Intubate (DNI)  Continuous       Question Answer Comment  If pulseless and not breathing No CPR or chest compressions.   In Pre-Arrest Conditions (Patient Is Breathing and Has A Pulse) Do not intubate. Provide all appropriate non-invasive medical interventions. Avoid ICU transfer unless indicated or required.   Consent: Discussion documented in EHR or advanced directives reviewed      03/16/23 0745           Code Status History     Date Active Date Inactive Code Status Order ID Comments User Context   03/16/2023 0223 03/16/2023 0745 Limited: Do not attempt resuscitation (DNR) -DNR-LIMITED -Do Not Intubate/DNI  [id number]  Andris Baumann, MD ED   01/26/2021 1725 01/30/2021 1746 DNR [id number]  Lorretta Harp, MD ED   01/26/2021 1644 01/26/2021 1725 Full Code [id number]  Lorretta Harp, MD ED   12/09/2020 1520 12/12/2020 2241 Full Code [id number]  Darlin Drop, DO ED   11/25/2020 1246 11/25/2020 2054 Full Code [id number]  Schnier, Latina Craver, MD Inpatient   11/08/2020 1033 11/08/2020 1935 Full Code [id number]  Schnier, Latina Craver, MD Inpatient   11/08/2017 2233 11/09/2017 1636 Full Code [id number]  Cammy Copa, MD Inpatient   10/31/2017 1530 10/31/2017 1902 Full Code [id number]  Annice Needy, MD Inpatient   08/17/2016 1641 08/20/2016 2315 Full Code [id number]  Efrain Sella, MD Inpatient   08/17/2016 0552 08/17/2016 1618 Full Code [id number]  Leafy Ro, MD ED   08/19/2015 1048 08/19/2015 1628 Full Code [id number]  Dalia Heading, MD Inpatient    10/02/2012 1324 10/03/2012 1436 Full Code [id number]  Hewitt Shorts, MD Inpatient           Indications/Preliminary Diagnosis:   Consent: (Place X beside choice/s below)  The benefits, risks and possible complications of the procedure were        explained to:  ___ patient  ___ patient's family  ___ other:___________  who verbalized understanding and gave:  ___ verbal  __x_ written  ___ verbal and written  ___ telephone  ___ other:________ consent.      Unable to obtain consent; procedure performed on emergent basis.     Other:       PRESEDATION ASSESSMENT: History and Physical has been performed. Patient meds and allergies have been reviewed. Presedation airway examination has been performed and documented. Baseline vital signs, sedation score, oxygenation status, and cardiac rhythm were reviewed. Patient was deemed to be in satisfactory condition to undergo the procedure.    PREMEDICATIONS:  As per anesthesia      PROCEDURE DETAILS: Timeout performed and correct patient, name, & ID confirmed. Following prep per Pulmonary policy, appropriate sedation was administered. The Bronchoscope was inserted in to oral cavity with bite block in place. Therapeutic aspiration of Tracheobronchial tree was performed.  Airway exam proceeded with findings, technical procedures, and specimen collection as noted below. At the end of exam the scope was withdrawn without incident. Impression and Plan as noted below.  Airway Prep (Place X beside choice below)   1% Transtracheal Lidocaine Anesthetization 7 cc   Patient prepped per Bronchoscopy Lab Policy       Insertion Route (Place X beside choice below)   Nasal   Oral  x Endotracheal Tube   Tracheostomy   INTRAPROCEDURE MEDICATIONS:  Sedative/Narcotic Amt Dose  As per anesthesia                 Medication Amt Dose  Medication Amt Dose  Lidocaine 1% 3 cc  Epinephrine 1:10,000 sol  cc  Xylocaine 4%  cc  Cocaine  cc    TECHNICAL PROCEDURES: (Place X beside choice below)   Procedures  Description    None     Electrocautery     Cryotherapy     Balloon Dilatation     Bronchography     Stent Placement   x  Therapeutic Aspiration RLL, RML, LUL, LLL    Laser/Argon Plasma    Brachytherapy Catheter Placement    Foreign Body Removal         SPECIMENS (Sites): (Place X beside choice below)  Specimens Description   No Specimens Obtained     Washings   x Lavage Ice cold lavage at LUL x 5   Biopsies    Fine Needle Aspirates    Brushings    Sputum    FINDINGS: bleeding of left upper lobe with friable mucosa.  Ice cold lavage performed x 5 with resolution of bleeding.  Bloody Mucopurulent BAL was aspirated and sent to lab.  Cytology was sent from BAL at LUL ESTIMATED BLOOD LOSS: expected <5 cc COMPLICATIONS/RESOLUTION: none      IMPRESSION:POST-PROCEDURE DX:   Bleeding at Left upper lobe.  Mucus plugging bilaterally.  Theraputic aspiration performed bilatearlly.  BAL with cytology done at LUL.  Microbiology specimens sent from BAL at LUL.   RECOMMENDATION/PLAN:    Patient remains hospitalized.  Awaiting cultures.  Patient may be optimized for discharge based on clinical improvement.     Vida Rigger, M.D.  Pulmonary & Critical Care Medicine  [facility]

## 2023-03-18 NOTE — Transfer of Care (Signed)
Immediate Anesthesia Transfer of Care Note  Patient: Todd Whitehead  Procedure(s) Performed: FLEXIBLE BRONCHOSCOPY (Bilateral) BEDSIDE BRONCHOSCOPY FIBEROPTIC (Bilateral)  Patient Location: PACU  Anesthesia Type:General  Level of Consciousness: awake, drowsy, and patient cooperative  Airway & Oxygen Therapy: Patient Spontanous Breathing and Patient connected to nasal cannula oxygen  Post-op Assessment: Report given to RN, Post -op Vital signs reviewed and stable, and Patient moving all extremities  Post vital signs: Reviewed and stable  Last Vitals:  Vitals Value Taken Time  BP 171/67 03/18/23 1615  Temp 97   Pulse 93 03/18/23 1619  Resp 24 03/18/23 1619  SpO2 93 % 03/18/23 1619  Vitals shown include unfiled device data.  Last Pain:  Vitals:   03/18/23 1437  TempSrc:   PainSc: 0-No pain         Complications: No notable events documented.

## 2023-03-18 NOTE — Progress Notes (Signed)
1      PROGRESS NOTE    Todd Whitehead  NUU:[id number] DOB: Sep 26, 1945 DOA: 03/15/2023 PCP: Lauro Regulus, MD    Brief Narrative:   78 y.o. male with medical history significant of  hypertension, hyperlipidemia, COPD not on home oxygen, depression with anxiety, BPH, PVD, CAD, CABG, CHF with EF of 45-50% in 12/2020, CKD-3, type II diabetes mellitus, tobacco use disorder, anemia, hx of melanoma, MGUS, and carotid artery stenosis. He presents to [facility] ED with reports of dyspnea, wheezing, and hemoptysis   2/16: Pulmonary and palliative care consult 2/17: bronch with BAL    Assessment & Plan:   Principal Problem:   CAP (community acquired pneumonia) Active Problems:   Hypertension   Hyperlipidemia   Depression with anxiety   CAD (coronary artery disease)   Anemia   Tobacco abuse   MGUS (monoclonal gammopathy of unknown significance)   CKD (chronic kidney disease) stage 3, GFR 30-59 ml/min (HCC)   #Acute Hypoxic Respiratory Failure #COPD Exacerbation #Community Acquired Pneumonia -Continue IV Rocephin and Azithromycin -Currently needing 2 L oxygen via nasal cannula - Prednisone 40mg  daily - Mucinex - Scheduled and PRN Bronchodilators - Urine Legionella pending and S. Pneumococcal antigen negative -Negative blood and sputum cultures -Pulmonary following -Chest PT with vest - Brovana, 3% hypertonic saline and Yupelri nebulizer -Nebulized TXA per pulmonary   #Hemoptysis Secondary to above, CTA PE negative - Hold home aspirin and plavix - Mechanical VTE prophylaxis - s/p bronch on 2/17 with BAL and mucus removal   #Chronic Systolic Congestive Heart Failure -Continue home Entresto, Metoprolol  - Hold home Farxiga   #Hypertension -Continue home Metoprolol, Entresto   #Coronary Artery Disease - Temporarily hold home aspirin and plavix in setting of hemoptysis   #Hyperlipidemia -Continue home atorvastatin   #Chronic Kidney Disease, stage 3 -  Avoid nephrotoxins, Contrast Dyes, Hypotension and Dehydration t - Renally Adjust Meds -Patient is eating so no need for IV fluids   #Type II Diabetes Mellitus - Hold home Farxiga - ACHS CBG monitoring and SSI   #Chronic Normocytic Anemia  - Monitor closely in setting of hemoptysis - Repeat CBC in AM   #MGUS Follow with Dr Cathie Hoops with heme/onc outpatient. 07/2022 note recommends surveillance and 6 month follow up.    #Depression with Anxiety -Continue home Celexa and Buspar - Continue at bedtime Seroquel   #Tobacco Use Disorder -Counseled on cessation - Patient declined Nicotine Replacement therapy while inpatient   DVT prophylaxis: SCDs Place and maintain sequential compression device Start: 03/16/23 0942     Code Status: DNR Family Communication: None at bedside Disposition Plan: Possible discharge tomorrow depending on clinical condition, pulmonary workup   Consultants:  Pulmonary Palliative care   Antimicrobials:  Rocephin Zithromax   Subjective:  Sitting in the chair, agreeable for Bronch, symptoms slowly improving.  Objective: Vitals:   03/18/23 1615 03/18/23 1630 03/18/23 1645 03/18/23 1718  BP: (!) 171/67 107/82 134/64 118/61  Pulse: 93 85 61 79  Resp: (!) 21 (!) 21 17 20   Temp: 98.4 F (36.9 C)  98.2 F (36.8 C) 98 F (36.7 C)  TempSrc:    Oral  SpO2: 97% 97% 95% 94%  Weight:      Height:        Intake/Output Summary (Last 24 hours) at 03/18/2023 1739 Last data filed at 03/18/2023 1100 Gross per 24 hour  Intake 360 ml  Output 275 ml  Net 85 ml   Filed Weights   03/15/23 1706  03/18/23 1437  Weight: 52.2 kg 51.7 kg    Examination:  General exam: Appears calm and comfortable, cachectic looking Respiratory system: Mild expiratory wheezing bilaterally.  Rhonchi at bases. Cardiovascular system: S1 & S2 heard, RRR. No JVD, murmurs, No pedal edema. Gastrointestinal system: Abdomen is soft, benign Central nervous system: Alert and oriented. No  focal neurological deficits. Extremities: Symmetric 5 x 5 power. Skin: No rashes, lesions or ulcers Psychiatry: Judgement and insight appear normal. Mood & affect appropriate.     Data Reviewed: I have personally reviewed following labs and imaging studies  CBC: Recent Labs  Lab 03/15/23 1708 03/18/23 0555  WBC 8.1 12.7*  HGB 10.4* 10.1*  HCT 30.3* 30.6*  MCV 89.6 91.1  PLT 178 185   Basic Metabolic Panel: Recent Labs  Lab 03/15/23 1708 03/18/23 0555  NA 130* 138  K 4.0 3.8  CL 95* 105  CO2 22 27  GLUCOSE 211* 102*  BUN 27* 19  CREATININE 1.25* 0.96  CALCIUM 9.1 9.0   GFR: Estimated Creatinine Clearance: 47.1 mL/min (by C-G formula based on SCr of 0.96 mg/dL). Liver Function Tests: Recent Labs  Lab 03/15/23 1708  AST 33  ALT 19  ALKPHOS 73  BILITOT 0.8  PROT 6.8  ALBUMIN 3.9   No results for input(s): "LIPASE", "AMYLASE" in the last 168 hours. No results for input(s): "AMMONIA" in the last 168 hours. Coagulation Profile: Recent Labs  Lab 03/16/23 0015  INR 1.0   Cardiac Enzymes: No results for input(s): "CKTOTAL", "CKMB", "CKMBINDEX", "TROPONINI" in the last 168 hours. BNP (last 3 results) No results for input(s): "PROBNP" in the last 8760 hours. HbA1C: Recent Labs    03/16/23 0442  HGBA1C 6.0*   CBG: Recent Labs  Lab 03/17/23 1608 03/17/23 2031 03/18/23 0731 03/18/23 1130 03/18/23 1718  GLUCAP 154* 133* 103* 100* 138*    Sepsis Labs: Recent Labs  Lab 03/16/23 0015 03/16/23 0442  LATICACIDVEN 0.9 0.8    Recent Results (from the past 240 hours)  Resp panel by RT-PCR (RSV, Flu A&B, Covid) Anterior Nasal Swab     Status: None   Collection Time: 03/15/23  5:16 PM   Specimen: Anterior Nasal Swab  Result Value Ref Range Status   SARS Coronavirus 2 by RT PCR NEGATIVE NEGATIVE Final    Comment: (NOTE) SARS-CoV-2 target nucleic acids are NOT DETECTED.  The SARS-CoV-2 RNA is generally detectable in upper respiratory specimens during  the acute phase of infection. The lowest concentration of SARS-CoV-2 viral copies this assay can detect is 138 copies/mL. A negative result does not preclude SARS-Cov-2 infection and should not be used as the sole basis for treatment or other patient management decisions. A negative result may occur with  improper specimen collection/handling, submission of specimen other than nasopharyngeal swab, presence of viral mutation(s) within the areas targeted by this assay, and inadequate number of viral copies(<138 copies/mL). A negative result must be combined with clinical observations, patient history, and epidemiological information. The expected result is Negative.  Fact Sheet for Patients:  BloggerCourse.com  Fact Sheet for Healthcare Providers:  SeriousBroker.it  This test is no t yet approved or cleared by the [place] FDA and  has been authorized for detection and/or diagnosis of SARS-CoV-2 by FDA under an Emergency Use Authorization (EUA). This EUA will remain  in effect (meaning this test can be used) for the duration of the COVID-19 declaration under Section 564(b)(1) of the Act, 21 U.S.C.section 360bbb-3(b)(1), unless the authorization is terminated  or  revoked sooner.       Influenza A by PCR NEGATIVE NEGATIVE Final   Influenza B by PCR NEGATIVE NEGATIVE Final    Comment: (NOTE) The Xpert Xpress SARS-CoV-2/FLU/RSV plus assay is intended as an aid in the diagnosis of influenza from Nasopharyngeal swab specimens and should not be used as a sole basis for treatment. Nasal washings and aspirates are unacceptable for Xpert Xpress SARS-CoV-2/FLU/RSV testing.  Fact Sheet for Patients: BloggerCourse.com  Fact Sheet for Healthcare Providers: SeriousBroker.it  This test is not yet approved or cleared by the [place] FDA and has been authorized for detection and/or  diagnosis of SARS-CoV-2 by FDA under an Emergency Use Authorization (EUA). This EUA will remain in effect (meaning this test can be used) for the duration of the COVID-19 declaration under Section 564(b)(1) of the Act, 21 U.S.C. section 360bbb-3(b)(1), unless the authorization is terminated or revoked.     Resp Syncytial Virus by PCR NEGATIVE NEGATIVE Final    Comment: (NOTE) Fact Sheet for Patients: BloggerCourse.com  Fact Sheet for Healthcare Providers: SeriousBroker.it  This test is not yet approved or cleared by the [place] FDA and has been authorized for detection and/or diagnosis of SARS-CoV-2 by FDA under an Emergency Use Authorization (EUA). This EUA will remain in effect (meaning this test can be used) for the duration of the COVID-19 declaration under Section 564(b)(1) of the Act, 21 U.S.C. section 360bbb-3(b)(1), unless the authorization is terminated or revoked.  Performed at [facility], [address] [place], [place] [address]   Blood culture (routine x 2)     Status: None (Preliminary result)   Collection Time: 03/16/23 12:15 AM   Specimen: BLOOD LEFT FOREARM  Result Value Ref Range Status   Specimen Description BLOOD LEFT FOREARM  Final   Special Requests   Final    BOTTLES DRAWN AEROBIC AND ANAEROBIC Blood Culture adequate volume   Culture   Final    NO GROWTH 2 DAYS Performed at [facility], [address], [place], [place] [address]    Report Status PENDING  Incomplete  Blood culture (routine x 2)     Status: None (Preliminary result)   Collection Time: 03/16/23 12:15 AM   Specimen: BLOOD LEFT HAND  Result Value Ref Range Status   Specimen Description BLOOD LEFT HAND  Final   Special Requests   Final    BOTTLES DRAWN AEROBIC AND ANAEROBIC Blood Culture adequate volume   Culture   Final    NO GROWTH 2 DAYS Performed at [facility], [address],  [place], [place] [address]    Report Status PENDING  Incomplete  Expectorated Sputum Assessment w Gram Stain, Rflx to Resp Cult     Status: None   Collection Time: 03/16/23  8:15 AM   Specimen: Sputum  Result Value Ref Range Status   Specimen Description SPUTUM  Final   Special Requests Normal  Final   Sputum evaluation   Final    Sputum specimen not acceptable for testing.  Please recollect.   RESULT CALLED TO, READ BACK BY AND VERIFIED WITH: Prudence Davidson AT 1227 ON 03/17/23 BY SS Performed at [facility], [address] [place], [place] [address]    Report Status 03/17/2023 FINAL  Final  C Difficile Quick Screen w PCR reflex     Status: None   Collection Time: 03/17/23 12:24 PM   Specimen: STOOL  Result Value Ref Range Status   C Diff antigen NEGATIVE NEGATIVE Final  C Diff toxin NEGATIVE NEGATIVE Final   C Diff interpretation No C. difficile detected.  Final    Comment: Performed at [facility], [address] [place], [place] [address]  Gastrointestinal Panel by PCR , Stool     Status: None   Collection Time: 03/17/23 12:24 PM   Specimen: STOOL  Result Value Ref Range Status   Campylobacter species NOT DETECTED NOT DETECTED Final   Plesimonas shigelloides NOT DETECTED NOT DETECTED Final   Salmonella species NOT DETECTED NOT DETECTED Final   Yersinia enterocolitica NOT DETECTED NOT DETECTED Final   Vibrio species NOT DETECTED NOT DETECTED Final   Vibrio cholerae NOT DETECTED NOT DETECTED Final   Enteroaggregative E coli (EAEC) NOT DETECTED NOT DETECTED Final   Enteropathogenic E coli (EPEC) NOT DETECTED NOT DETECTED Final   Enterotoxigenic E coli (ETEC) NOT DETECTED NOT DETECTED Final   Shiga like toxin producing E coli (STEC) NOT DETECTED NOT DETECTED Final   Shigella/Enteroinvasive E coli (EIEC) NOT DETECTED NOT DETECTED Final   Cryptosporidium NOT DETECTED NOT DETECTED Final   Cyclospora cayetanensis NOT DETECTED NOT DETECTED Final   Entamoeba  histolytica NOT DETECTED NOT DETECTED Final   Giardia lamblia NOT DETECTED NOT DETECTED Final   Adenovirus F40/41 NOT DETECTED NOT DETECTED Final   Astrovirus NOT DETECTED NOT DETECTED Final   Norovirus GI/GII NOT DETECTED NOT DETECTED Final   Rotavirus A NOT DETECTED NOT DETECTED Final   Sapovirus (I, II, IV, and V) NOT DETECTED NOT DETECTED Final    Comment: Performed at [facility], [address], [place], [place] [address]         Radiology Studies: No results found.       Scheduled Meds:  atorvastatin  40 mg Oral Daily   azithromycin  500 mg Oral QHS   brimonidine  1 drop Both Eyes BID   busPIRone  30 mg Oral BID   citalopram  20 mg Oral Daily   guaiFENesin  600 mg Oral BID   insulin aspart  0-5 Units Subcutaneous QHS   insulin aspart  0-9 Units Subcutaneous TID WC   latanoprost  1 drop Both Eyes QHS   metoprolol succinate  12.5 mg Oral Daily   nicotine  21 mg Transdermal Daily   predniSONE  40 mg Oral Q breakfast   QUEtiapine  50 mg Oral QHS   sacubitril-valsartan  1 tablet Oral BID   tamsulosin  0.4 mg Oral QPC breakfast   Continuous Infusions:  cefTRIAXone (ROCEPHIN)  IV 2 g (03/18/23 0008)     LOS: 2 days    Time spent: 35 minutes    Welden Hausmann Sherryll Burger, MD [facility] Pager [phone]  If 7PM-7AM, please contact night-coverage [email or web address]  03/18/2023, 5:39 PM

## 2023-03-18 NOTE — Anesthesia Procedure Notes (Signed)
Procedure Name: Intubation Date/Time: 03/18/2023 3:36 PM  Performed by: Rich Brave, CRNAPre-anesthesia Checklist: Patient identified, Emergency Drugs available, Suction available, Patient being monitored and Timeout performed Patient Re-evaluated:Patient Re-evaluated prior to induction Oxygen Delivery Method: Circle system utilized Preoxygenation: Pre-oxygenation with 100% oxygen Induction Type: IV induction Ventilation: Mask ventilation without difficulty Laryngoscope Size: Mac and 4 Grade View: Grade I Tube type: Oral Tube size: 8.0 mm Number of attempts: 1 Airway Equipment and Method: Stylet and Video-laryngoscopy Placement Confirmation: ETT inserted through vocal cords under direct vision, positive ETCO2 and breath sounds checked- equal and bilateral Secured at: 20 cm Tube secured with: Tape Dental Injury: Teeth and Oropharynx as per pre-operative assessment

## 2023-03-19 ENCOUNTER — Encounter: Payer: Self-pay | Admitting: Pulmonary Disease

## 2023-03-19 ENCOUNTER — Encounter: Payer: Self-pay | Admitting: Oncology

## 2023-03-19 ENCOUNTER — Other Ambulatory Visit: Payer: Self-pay

## 2023-03-19 DIAGNOSIS — I10 Essential (primary) hypertension: Secondary | ICD-10-CM | POA: Diagnosis not present

## 2023-03-19 DIAGNOSIS — D472 Monoclonal gammopathy: Secondary | ICD-10-CM | POA: Diagnosis not present

## 2023-03-19 DIAGNOSIS — F418 Other specified anxiety disorders: Secondary | ICD-10-CM | POA: Diagnosis not present

## 2023-03-19 DIAGNOSIS — J189 Pneumonia, unspecified organism: Secondary | ICD-10-CM | POA: Diagnosis not present

## 2023-03-19 LAB — CBC
HCT: 27.8 % — ABNORMAL LOW (ref 39.0–52.0)
Hemoglobin: 9.6 g/dL — ABNORMAL LOW (ref 13.0–17.0)
MCH: 30.8 pg (ref 26.0–34.0)
MCHC: 34.5 g/dL (ref 30.0–36.0)
MCV: 89.1 fL (ref 80.0–100.0)
Platelets: 195 10*3/uL (ref 150–400)
RBC: 3.12 MIL/uL — ABNORMAL LOW (ref 4.22–5.81)
RDW: 14.5 % (ref 11.5–15.5)
WBC: 10.8 10*3/uL — ABNORMAL HIGH (ref 4.0–10.5)
nRBC: 0 % (ref 0.0–0.2)

## 2023-03-19 LAB — GLUCOSE, CAPILLARY
Glucose-Capillary: 103 mg/dL — ABNORMAL HIGH (ref 70–99)
Glucose-Capillary: 96 mg/dL (ref 70–99)

## 2023-03-19 LAB — BASIC METABOLIC PANEL
Anion gap: 7 (ref 5–15)
BUN: 17 mg/dL (ref 8–23)
CO2: 25 mmol/L (ref 22–32)
Calcium: 8.9 mg/dL (ref 8.9–10.3)
Chloride: 105 mmol/L (ref 98–111)
Creatinine, Ser: 0.96 mg/dL (ref 0.61–1.24)
GFR, Estimated: >60 mL/min (ref >60)
Glucose, Bld: 88 mg/dL (ref 70–99)
Potassium: 3.9 mmol/L (ref 3.5–5.1)
Sodium: 137 mmol/L (ref 135–145)

## 2023-03-19 LAB — LEGIONELLA PNEUMOPHILA SEROGP 1 UR AG: L. pneumophila Serogp 1 Ur Ag: NEGATIVE

## 2023-03-19 LAB — CYTOLOGY - NON PAP

## 2023-03-19 MED ORDER — GUAIFENESIN ER 600 MG PO TB12
600.0000 mg | ORAL_TABLET | Freq: Two times a day (BID) | ORAL | 0 refills | Status: AC
  Filled 2023-03-19: qty 40, 20d supply, fill #0

## 2023-03-19 MED ORDER — ADULT MULTIVITAMIN W/MINERALS CH: 1.0000 | ORAL_TABLET | Freq: Every day | ORAL | Status: DC

## 2023-03-19 MED ORDER — DOXYCYCLINE HYCLATE 100 MG PO TABS
100.0000 mg | ORAL_TABLET | Freq: Two times a day (BID) | ORAL | 0 refills | Status: AC
  Filled 2023-03-19: qty 10, 5d supply, fill #0

## 2023-03-19 MED ORDER — DEXTROMETHORPHAN-GUAIFENESIN 20-200 MG/20ML PO LIQD
10.0000 mL | ORAL | 0 refills | Status: DC | PRN
  Filled 2023-03-19: qty 118, 2d supply, fill #0

## 2023-03-19 MED ORDER — PREDNISONE 10 MG PO TABS
ORAL_TABLET | ORAL | 0 refills | Status: AC
  Filled 2023-03-19: qty 21, 6d supply, fill #0

## 2023-03-19 MED ORDER — ENSURE ENLIVE PO LIQD: 237.0000 mL | Freq: Three times a day (TID) | ORAL | Status: DC

## 2023-03-19 NOTE — Care Management Important Message (Signed)
Important Message  Patient Details  Name: Todd Whitehead MRN: [id number] Date of Birth: 01-Sep-1945   Important Message Given:  Yes - Medicare IM     Cristela Blue, CMA 03/19/2023, 10:31 AM

## 2023-03-19 NOTE — TOC Initial Note (Signed)
Transition of Care [facility]) - Initial/Assessment Note    Patient Details  Name: Todd Whitehead MRN: [id number] Date of Birth: Sep 14, 1945  [facility]) CM/SW Contact:    Chapman Fitch, RN Phone Number: 03/19/2023, 9:57 AM  Clinical Narrative:                 Met with patient and daughter at bedside  Patient agreeable to home health.  States he does not have a preference of agency for home health, DME or palliative.   - Referral made to [facility] for RN, PT, and OT - Referral made to [place] with [employer], contracting for outpatient palliative - Referral made to Jon with [employer] for O2  Daughter states they will purchase a cane   Expected Discharge Plan: Home/Self Care Barriers to Discharge: Continued Medical Work up   Patient Goals and CMS Choice Patient states their goals for this hospitalization and ongoing recovery are:: declines HH CMS Medicare.gov Compare Post Acute Care list provided to:: Patient Choice offered to / list presented to : Patient      Expected Discharge Plan and Services       Living arrangements for the past 2 months: Single Family Home Expected Discharge Date: 03/19/23                                    Prior Living Arrangements/Services Living arrangements for the past 2 months: Single Family Home Lives with:: Self Patient language and need for interpreter reviewed:: Yes Do you feel safe going back to the place where you live?: Yes      Need for Family Participation in Patient Care: Yes (Comment) Care giver support system in place?: Yes (comment)   Criminal Activity/Legal Involvement Pertinent to Current Situation/Hospitalization: No - Comment as needed  Activities of Daily Living   ADL Screening (condition at time of admission) Independently performs ADLs?: Yes (appropriate for developmental age) Is the patient deaf or have difficulty hearing?: No Does the patient have difficulty seeing, even when wearing  glasses/contacts?: No Does the patient have difficulty concentrating, remembering, or making decisions?: No  Permission Sought/Granted Permission sought to share information with : Facility Industrial/product designer granted to share information with : Yes, Verbal Permission Granted     Permission granted to share info w AGENCY: DME companies if indicated        Emotional Assessment       Orientation: : Oriented to Self, Oriented to Place, Oriented to  Time, Oriented to Situation Alcohol / Substance Use: Not Applicable Psych Involvement: No (comment)  Admission diagnosis:  Pneumonia [J18.9] Acute respiratory failure with hypoxia (HCC) [J96.01] COPD with acute exacerbation (HCC) [J44.1] Hemoptysis [R04.2] Multifocal pneumonia [J18.9] Patient Active Problem List   Diagnosis Date Noted   Pneumonia 03/16/2023   CKD (chronic kidney disease) stage 3, GFR 30-59 ml/min (HCC) 08/27/2022   MGUS (monoclonal gammopathy of unknown significance) 05/17/2021   Chronic mesenteric ischemia (HCC) 03/23/2021   Acute on chronic systolic CHF (congestive heart failure) (HCC) 01/26/2021   Hypertensive urgency 01/26/2021   HLD (hyperlipidemia) 01/26/2021   COPD (chronic obstructive pulmonary disease) (HCC) 01/26/2021   Tobacco abuse 01/26/2021   Hemoptysis 01/26/2021   Hypokalemia 01/26/2021   Lung nodule 01/26/2021   Elevated troponin 01/26/2021   Normocytic anemia 01/26/2021   Protein-calorie malnutrition, severe (HCC) 01/26/2021   CAP (community acquired pneumonia) 01/26/2021   SOB (shortness of breath)  Hypoxia    Symptomatic anemia    CHF (congestive heart failure) (HCC) 12/09/2020   Anemia 11/18/2020   Low serum vitamin B12 11/18/2020   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   Hyperlipidemia 02/07/2018   Epistaxis 11/08/2017   PAD (peripheral artery disease) (HCC) 09/28/2017   Hypertension 06/25/2017   Abdominal pain 06/25/2017   Aortic atherosclerosis (HCC) 06/25/2017    Type 2 diabetes mellitus with stage 3 chronic kidney disease, without long-term current use of insulin (HCC) 12/24/2016   Bile duct calculus with acute cholecystitis 08/17/2016   Acute cholecystitis    Personal history of tobacco use, presenting hazards to health 07/08/2016   Health care maintenance 12/19/2015   Elevated LFTs 10/10/2015   Edema of right lower extremity 10/10/2015   COPD (chronic obstructive pulmonary disease) with chronic bronchitis (HCC) 10/04/2015   Depression with anxiety 09/11/2015   Carotid artery disease (HCC) 09/09/2015   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   CAD (coronary artery disease) 08/22/2015   Benign non-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 03/21/2015   Chronic low back pain 03/21/2015   Depression, major, 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 [place] [address] Phone: [phone] Fax: [phone]     Social Drivers of Health (SDOH) Social History: SDOH Screenings   Food Insecurity: No Food Insecurity (03/16/2023)  Housing: Low Risk  (03/16/2023)  Transportation Needs: No Transportation Needs (03/16/2023)  Utilities: Not At Risk (03/16/2023)  Depression (PHQ2-9): Medium Risk (10/05/2021)  Social Connections: Moderately Integrated (03/16/2023)  Tobacco Use: High Risk (03/15/2023)   SDOH Interventions:     Readmission Risk Interventions    01/27/2021   12:18 PM  Readmission Risk Prevention Plan  Transportation Screening Complete  PCP or Specialist Appt within 5-7 Days Complete  Home  Care Screening Patient refused  Medication Review (RN CM) Complete

## 2023-03-19 NOTE — Discharge Summary (Signed)
Physician Discharge Summary   Patient: Todd Whitehead MRN: [id number] DOB: January 14, 1946  Admit date:     03/15/2023  Discharge date: 03/19/23  Discharge Physician: Delfino Lovett   PCP: Lauro Regulus, MD   Recommendations at discharge:    F/up with outpt providers as requested  Discharge Diagnoses: Principal Problem:   CAP (community acquired pneumonia) Active Problems:   Hypertension   Hyperlipidemia   Depression with anxiety   CAD (coronary artery disease)   Anemia   Tobacco abuse   MGUS (monoclonal gammopathy of unknown significance)   CKD (chronic kidney disease) stage 3, GFR 30-59 ml/min [facility])  Hospital Course: Assessment and Plan:  78 y.o. male with medical history significant of  hypertension, hyperlipidemia, COPD not on home oxygen, depression with anxiety, BPH, PVD, CAD, CABG, CHF with EF of 45-50% in 12/2020, CKD-3, type II diabetes mellitus, tobacco use disorder, anemia, hx of melanoma, MGUS, and carotid artery stenosis. He presents to [facility] ED with reports of dyspnea, wheezing, and hemoptysis    2/16: Pulmonary and palliative care consult 2/17: bronch with BAL      Assessment & Plan:   Principal Problem:   CAP (community acquired pneumonia) Active Problems:   Hypertension   Hyperlipidemia   Depression with anxiety   CAD (coronary artery disease)   Anemia   Tobacco abuse   MGUS (monoclonal gammopathy of unknown significance)   CKD (chronic kidney disease) stage 3, GFR 30-59 ml/min (HCC)     #Acute Hypoxic Respiratory Failure #COPD Exacerbation #Community Acquired Pneumonia -Improved with Abx, Steroids, Nebs - will need 2 liters at DC   #Hemoptysis Secondary to above, CTA PE negative - now resolved - s/p bronch on 2/17 with BAL and mucus removal   #Chronic Systolic Congestive Heart Failure #Hypertension #Coronary Artery Disease #Hyperlipidemia #Chronic Kidney Disease, stage 3 #Type II Diabetes Mellitus #Chronic Normocytic  Anemia  #MGUS Follow with Dr Cathie Hoops with heme/onc outpatient. 07/2022 note recommends surveillance and 6 month follow up.    #Depression with Anxiety #Tobacco Use Disorder -Counseled on cessation - Patient declined Nicotine Replacement therapy while inpatient       Consultants: Pulmo Procedures performed: Bronch  Disposition: Home health Diet recommendation:  Discharge Diet Orders (From admission, onward)     Start     Ordered   03/19/23 0000  Diet - low sodium heart healthy        03/19/23 0945           Carb modified diet DISCHARGE MEDICATION: Allergies as of 03/19/2023       Reactions   Other Nausea And Vomiting   Cheese,butter,sour cream,cottage cheese   Poison Ivy Extract Rash   Poison Oak Extract Rash        Medication List     STOP taking these medications    QUEtiapine 25 MG tablet Commonly known as: SEROQUEL   QUEtiapine 50 MG tablet Commonly known as: SEROQUEL   traMADol 50 MG tablet Commonly known as: ULTRAM       TAKE these medications    acetaminophen 500 MG tablet Commonly known as: TYLENOL Take 1 tablet (500 mg total) by mouth every 6 (six) hours as needed.   albuterol (2.5 MG/3ML) 0.083% nebulizer solution Commonly known as: PROVENTIL Take 3 mLs (2.5 mg total) by nebulization every 6 (six) hours as needed for wheezing or shortness of breath.   ALPRAZolam 0.25 MG tablet Commonly known as: XANAX Take by mouth at bedtime as needed for sleep.  aspirin EC 81 MG tablet Take 81 mg by mouth daily. Swallow whole.   atorvastatin 40 MG tablet Commonly known as: LIPITOR Take 40 mg by mouth daily.   brimonidine 0.15 % ophthalmic solution Commonly known as: ALPHAGAN Place 1 drop into both eyes 2 (two) times daily.   busPIRone 30 MG tablet Commonly known as: BUSPAR Take 30 mg by mouth 2 (two) times daily.   citalopram 20 MG tablet Commonly known as: CELEXA Take 20 mg by mouth daily.   clopidogrel 75 MG tablet Commonly known as:  PLAVIX TAKE 1 TABLET(75 MG) BY MOUTH DAILY   dapagliflozin propanediol 10 MG Tabs tablet Commonly known as: Farxiga Take 1 tablet (10 mg total) by mouth daily before breakfast.   doxycycline 100 MG tablet Commonly known as: VIBRA-TABS Take 1 tablet (100 mg total) by mouth 2 (two) times daily for 5 days.   ferrous sulfate 325 (65 FE) MG EC tablet TAKE 1 TABLET(325 MG) BY MOUTH TWICE DAILY WITH A MEAL   FT Mucus Relief 12HR 600 MG 12 hr tablet Generic drug: guaiFENesin Take 1 tablet (600 mg total) by mouth 2 (two) times daily for 5 days.   latanoprost 0.005 % ophthalmic solution Commonly known as: XALATAN Place 1 drop into both eyes at bedtime.   metoprolol succinate 25 MG 24 hr tablet Commonly known as: TOPROL-XL Take 12.5 mg by mouth daily.   multivitamin with minerals Tabs tablet Take 1 tablet by mouth daily.   predniSONE 10 MG tablet Commonly known as: DELTASONE Take 6 tablets (60 mg total) by mouth daily for 1 day, THEN 5 tablets (50 mg total) daily for 1 day, THEN 4 tablets (40 mg total) daily for 1 day, THEN 3 tablets (30 mg total) daily for 1 day, THEN 2 tablets (20 mg total) daily for 1 day, THEN 1 tablet (10 mg total) daily for 1 day. Start taking on: March 19, 2023   Robafen DM 20-200 MG/20ML Liqd Generic drug: Dextromethorphan-guaiFENesin Take 10 mLs by mouth every 4 (four) hours as needed.   sacubitril-valsartan 49-51 MG Commonly known as: ENTRESTO Take 1 tablet by mouth 2 (two) times daily.   tamsulosin 0.4 MG Caps capsule Commonly known as: FLOMAX Take 1 capsule (0.4 mg total) by mouth daily after breakfast.   traZODone 50 MG tablet Commonly known as: DESYREL Take 50 mg by mouth at bedtime.        Follow-up Information     Lauro Regulus, MD. Go on 03/25/2023.   Specialty: Internal Medicine Why: [facility] Discharge F/UP.Go at 11:15am. Contact information: [address] [facility] [place]  [address] [phone]         Rickard Patience, MD. Nyra Capes on 03/26/2023.   Specialty: Oncology Why: [facility] Discharge F/UP. You already have a appointment on 03/26/23 at 10:15am, this can be your follow up. Contact information: [address] [place] [address] [phone]         Vida Rigger, MD. Go on 04/01/2023.   Specialty: Pulmonary Disease Why: [facility] Discharge F/UP. Go at 9:30am. Contact information: [address] [place] [address] [phone]                Discharge Exam: Ceasar Mons Weights   03/15/23 1706 03/18/23 1437  Weight: 52.2 kg 51.7 kg   General exam: Appears calm and comfortable, cachectic looking Respiratory system: Mild expiratory wheezing bilaterally.  Rhonchi at bases. Cardiovascular system: S1 & S2 heard, RRR. No  JVD, murmurs, No pedal edema. Gastrointestinal system: Abdomen is soft, benign Central nervous system: Alert and oriented. No focal neurological deficits. Extremities: Symmetric 5 x 5 power. Skin: No rashes, lesions or ulcers Psychiatry: Judgement and insight appear normal. Mood & affect appropriate.   Condition at discharge: fair  The results of significant diagnostics from this hospitalization (including imaging, microbiology, ancillary and laboratory) are listed below for reference.   Imaging Studies: DG Chest Port 1 View Result Date: 03/18/2023 CLINICAL DATA:  Status post bronchoscopy EXAM: PORTABLE CHEST 1 VIEW COMPARISON:  03/15/2023 FINDINGS: Prior CABG. Heart and mediastinal contours within normal limits. Aortic atherosclerosis. There is hyperinflation of the lungs compatible with COPD. Patchy opacities in the left upper lobe, similar to prior study. No confluent opacity on the right. No effusions. No pneumothorax. IMPRESSION: No pneumothorax following bronchoscopy. Hyperinflation/COPD. Stable left upper lobe opacity. Electronically Signed   By: Charlett Nose M.D.   On: 03/18/2023 18:29   CT Angio Chest  Pulmonary Embolism (PE) W or WO Contrast Result Date: 03/15/2023 CLINICAL DATA:  Hemoptysis. EXAM: CT ANGIOGRAPHY CHEST WITH CONTRAST TECHNIQUE: Multidetector CT imaging of the chest was performed using the standard protocol during bolus administration of intravenous contrast. Multiplanar CT image reconstructions and MIPs were obtained to evaluate the vascular anatomy. RADIATION DOSE REDUCTION: This exam was performed according to the departmental dose-optimization program which includes automated exposure control, adjustment of the mA and/or kV according to patient size and/or use of iterative reconstruction technique. CONTRAST:  60mL OMNIPAQUE IOHEXOL 350 MG/ML SOLN COMPARISON:  Radiograph earlier today.  Chest CT 04/26/2021 FINDINGS: Cardiovascular: There are no filling defects within the pulmonary arteries to suggest pulmonary embolus. The heart is normal in size. There is contrast refluxing into the hepatic veins and IVC. Calcification of native coronary arteries, post median sternotomy. Aortic atherosclerosis without aneurysm. Celiac stent. Mediastinum/Nodes: Small mediastinal and hilar lymph nodes, not enlarged by size criteria. Unremarkable appearance of the esophagus. No thyroid nodule. Lungs/Pleura: Patchy and consolidative left upper lobe opacity with areas of mucoid impaction and bronchial filling. Additional areas of mucoid impaction within the right middle and lower lobe. 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 Areas central bronchial thickening. There is biapical pleuroparenchymal scarring. Bandlike atelectasis or no pleural fluid. Scarring in the right lower lobe. Upper Abdomen: Contrast refluxes into the hepatic veins and IVC. Detailed assessment is limited due to paucity of intra-abdominal fat. Musculoskeletal: Scoliosis and mild degenerative change in the spine. Prior median sternotomy. There are no acute or suspicious osseous abnormalities. Review of the MIP images confirms the above findings.  IMPRESSION: 1. No pulmonary embolus. 2. Patchy and consolidative left upper lobe opacity with areas of mucoid impaction and bronchial filling, suspicious for pneumonia. Additional areas of mucoid impaction within the right middle and lower lobe. 3. Contrast refluxing into the hepatic veins and IVC, can be seen with right heart dysfunction. 4. 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 Aortic Atherosclerosis (ICD10-I70.0) and Emphysema (ICD10-J43.9). Electronically Signed   By: Narda Rutherford M.D.   On: 03/15/2023 19:47   DG Chest 2 View Result Date: 03/15/2023 CLINICAL DATA:  Hemoptysis. EXAM: CHEST - 2 VIEW COMPARISON:  Radiograph 08/03/2021 chest CT 04/26/2021 FINDINGS: Normal heart size. Post median sternotomy with epicardial wires in place. Chronic hyperinflation and emphysema. Ill-defined patchy left suprahilar opacity. No pneumothorax or pleural effusion. No acute osseous findings. IMPRESSION: 1. Patchy left suprahilar opacity suspicious for pneumonia. 2. Chronic hyperinflation and emphysema. Electronically Signed   By: Ivette Loyal.D.  On: 03/15/2023 19:26    Microbiology: Results for orders placed or performed during the hospital encounter of 03/15/23  Resp panel by RT-PCR (RSV, Flu A&B, Covid) Anterior Nasal Swab     Status: None   Collection Time: 03/15/23  5:16 PM   Specimen: Anterior Nasal Swab  Result Value Ref Range Status   SARS Coronavirus 2 by RT PCR NEGATIVE NEGATIVE Final    Comment: (NOTE) SARS-CoV-2 target nucleic acids are NOT DETECTED.  The SARS-CoV-2 RNA is generally detectable in upper respiratory specimens during the acute phase of infection. The lowest concentration of SARS-CoV-2 viral copies this assay can detect is 138 copies/mL. A negative result does not preclude SARS-Cov-2 infection and should not be used as the sole basis for treatment or other patient management decisions. A negative result may occur with  improper specimen  collection/handling, submission of specimen other than nasopharyngeal swab, presence of viral mutation(s) within the areas targeted by this assay, and inadequate number of viral copies(<138 copies/mL). A negative result must be combined with clinical observations, patient history, and epidemiological information. The expected result is Negative.  Fact Sheet for Patients:  BloggerCourse.com  Fact Sheet for Healthcare Providers:  SeriousBroker.it  This test is no t yet approved or cleared by the [place] FDA and  has been authorized for detection and/or diagnosis of SARS-CoV-2 by FDA under an Emergency Use Authorization (EUA). This EUA will remain  in effect (meaning this test can be used) for the duration of the COVID-19 declaration under Section 564(b)(1) of the Act, 21 U.S.C.section 360bbb-3(b)(1), unless the authorization is terminated  or revoked sooner.       Influenza A by PCR NEGATIVE NEGATIVE Final   Influenza B by PCR NEGATIVE NEGATIVE Final    Comment: (NOTE) The Xpert Xpress SARS-CoV-2/FLU/RSV plus assay is intended as an aid in the diagnosis of influenza from Nasopharyngeal swab specimens and should not be used as a sole basis for treatment. Nasal washings and aspirates are unacceptable for Xpert Xpress SARS-CoV-2/FLU/RSV testing.  Fact Sheet for Patients: BloggerCourse.com  Fact Sheet for Healthcare Providers: SeriousBroker.it  This test is not yet approved or cleared by the [place] FDA and has been authorized for detection and/or diagnosis of SARS-CoV-2 by FDA under an Emergency Use Authorization (EUA). This EUA will remain in effect (meaning this test can be used) for the duration of the COVID-19 declaration under Section 564(b)(1) of the Act, 21 U.S.C. section 360bbb-3(b)(1), unless the authorization is terminated or revoked.     Resp Syncytial  Virus by PCR NEGATIVE NEGATIVE Final    Comment: (NOTE) Fact Sheet for Patients: BloggerCourse.com  Fact Sheet for Healthcare Providers: SeriousBroker.it  This test is not yet approved or cleared by the [place] FDA and has been authorized for detection and/or diagnosis of SARS-CoV-2 by FDA under an Emergency Use Authorization (EUA). This EUA will remain in effect (meaning this test can be used) for the duration of the COVID-19 declaration under Section 564(b)(1) of the Act, 21 U.S.C. section 360bbb-3(b)(1), unless the authorization is terminated or revoked.  Performed at [facility], [address] [place], [place] [address]   Blood culture (routine x 2)     Status: None (Preliminary result)   Collection Time: 03/16/23 12:15 AM   Specimen: BLOOD LEFT FOREARM  Result Value Ref Range Status   Specimen Description BLOOD LEFT FOREARM  Final   Special Requests   Final    BOTTLES DRAWN AEROBIC AND ANAEROBIC Blood Culture adequate volume  Culture   Final    NO GROWTH 3 DAYS Performed at [facility], [address] [place], [place] [address]    Report Status PENDING  Incomplete  Blood culture (routine x 2)     Status: None (Preliminary result)   Collection Time: 03/16/23 12:15 AM   Specimen: BLOOD LEFT HAND  Result Value Ref Range Status   Specimen Description BLOOD LEFT HAND  Final   Special Requests   Final    BOTTLES DRAWN AEROBIC AND ANAEROBIC Blood Culture adequate volume   Culture   Final    NO GROWTH 3 DAYS Performed at [facility], [address] [place], [place] [address]    Report Status PENDING  Incomplete  Expectorated Sputum Assessment w Gram Stain, Rflx to Resp Cult     Status: None   Collection Time: 03/16/23  8:15 AM   Specimen: Sputum  Result Value Ref Range Status   Specimen Description SPUTUM  Final   Special Requests Normal  Final   Sputum evaluation   Final     Sputum specimen not acceptable for testing.  Please recollect.   RESULT CALLED TO, READ BACK BY AND VERIFIED WITH: Prudence Davidson AT 1227 ON 03/17/23 BY SS Performed at [facility], [address] [place], [place] [address]    Report Status 03/17/2023 FINAL  Final  C Difficile Quick Screen w PCR reflex     Status: None   Collection Time: 03/17/23 12:24 PM   Specimen: STOOL  Result Value Ref Range Status   C Diff antigen NEGATIVE NEGATIVE Final   C Diff toxin NEGATIVE NEGATIVE Final   C Diff interpretation No C. difficile detected.  Final    Comment: Performed at [facility], [address] [place], [place] [address]  Gastrointestinal Panel by PCR , Stool     Status: None   Collection Time: 03/17/23 12:24 PM   Specimen: STOOL  Result Value Ref Range Status   Campylobacter species NOT DETECTED NOT DETECTED Final   Plesimonas shigelloides NOT DETECTED NOT DETECTED Final   Salmonella species NOT DETECTED NOT DETECTED Final   Yersinia enterocolitica NOT DETECTED NOT DETECTED Final   Vibrio species NOT DETECTED NOT DETECTED Final   Vibrio cholerae NOT DETECTED NOT DETECTED Final   Enteroaggregative E coli (EAEC) NOT DETECTED NOT DETECTED Final   Enteropathogenic E coli (EPEC) NOT DETECTED NOT DETECTED Final   Enterotoxigenic E coli (ETEC) NOT DETECTED NOT DETECTED Final   Shiga like toxin producing E coli (STEC) NOT DETECTED NOT DETECTED Final   Shigella/Enteroinvasive E coli (EIEC) NOT DETECTED NOT DETECTED Final   Cryptosporidium NOT DETECTED NOT DETECTED Final   Cyclospora cayetanensis NOT DETECTED NOT DETECTED Final   Entamoeba histolytica NOT DETECTED NOT DETECTED Final   Giardia lamblia NOT DETECTED NOT DETECTED Final   Adenovirus F40/41 NOT DETECTED NOT DETECTED Final   Astrovirus NOT DETECTED NOT DETECTED Final   Norovirus GI/GII NOT DETECTED NOT DETECTED Final   Rotavirus A NOT DETECTED NOT DETECTED Final   Sapovirus (I, II, IV, and V) NOT DETECTED NOT  DETECTED Final    Comment: Performed at [facility], [address] [place], [place] [address]  Culture, BAL-quantitative w Gram Stain     Status: None (Preliminary result)   Collection Time: 03/18/23  4:15 PM   Specimen: Bronchoalveolar Lavage; Respiratory  Result Value Ref Range Status   Specimen Description   Final    BRONCHIAL ALVEOLAR LAVAGE Performed at [facility], [address], [place], [place] [address]    Special Requests NONE  Final   Gram Stain NO WBC SEEN NO ORGANISMS SEEN   Final   Culture   Final    NO GROWTH < 24 HOURS Performed at [facility], [address] [place], [place] [address]    Report Status PENDING  Incomplete    Labs: CBC: Recent Labs  Lab 03/15/23 1708 03/18/23 0555 03/19/23 0531  WBC 8.1 12.7* 10.8*  HGB 10.4* 10.1* 9.6*  HCT 30.3* 30.6* 27.8*  MCV 89.6 91.1 89.1  PLT 178 185 195   Basic Metabolic Panel: Recent Labs  Lab 03/15/23 1708 03/18/23 0555 03/19/23 0531  NA 130* 138 137  K 4.0 3.8 3.9  CL 95* 105 105  CO2 22 27 25   GLUCOSE 211* 102* 88  BUN 27* 19 17  CREATININE 1.25* 0.96 0.96  CALCIUM 9.1 9.0 8.9   Liver Function Tests: Recent Labs  Lab 03/15/23 1708  AST 33  ALT 19  ALKPHOS 73  BILITOT 0.8  PROT 6.8  ALBUMIN 3.9   CBG: Recent Labs  Lab 03/18/23 1130 03/18/23 1426 03/18/23 1718 03/18/23 2005 03/19/23 0743  GLUCAP 100* 103* 138* 197* 96    Discharge time spent: greater than 30 minutes.  Signed: Delfino Lovett, MD [facility] 03/19/2023

## 2023-03-19 NOTE — Progress Notes (Signed)
Mobility Specialist - Progress Note  Pre-mobility: HR 89 During mobility: HR 102 Post-mobility: HR 90   03/19/23 0946  Mobility  Activity Ambulated with assistance in room;Transferred from bed to chair;Dangled on edge of bed  Level of Assistance Standby assist, set-up cues, supervision of patient - no hands on  Assistive Device None  Distance Ambulated (ft) 12 ft  Activity Response Tolerated well  Mobility visit 1 Mobility  Mobility Specialist Start Time (ACUTE ONLY) 0932  Mobility Specialist Stop Time (ACUTE ONLY) 0945  Mobility Specialist Time Calculation (min) (ACUTE ONLY) 13 min   Nurse requested Mobility Specialist to perform oxygen saturation test with pt which includes removing pt from oxygen both at rest and while ambulating.  Below are the results from that testing.     Patient Saturations on Room Air at Rest = spO2 90%  Patient Saturations on [employer] with activity = sp02 86%  Patient Saturations on 2 Liters of oxygen while Ambulating = sp02 92%  At end of testing pt left in room on 2  Liters of oxygen = 93%  Reported results to nurse.   Pt supine upon entry, utilizing 2L. Pt agreeable to transfer to the recliner this date. MS removed Pt from [place], O2 90% at rest. Pt completed bed mob indep, dangled EOB for ~1 min while completing seated marches indep--- O2 desat 86%, unable to rise after 1 min PBL. Pt placed on 2L [place], O2 92%. Pt amb around the bed to the recliner MinG-SBA no AD, furniture cruising along the way. Pt left seated in the recliner with alarm set and needs within reach. Family present at bedside, MD notified of vitals gathered.  Zetta Bills Mobility Specialist 03/19/23 9:55 AM

## 2023-03-19 NOTE — Progress Notes (Signed)
PULMONOLOGY         Date: 03/19/2023,   MRN# [id number] Todd Whitehead 09-28-1945     AdmissionWeight: 52.2 kg                 CurrentWeight: 51.7 kg  Referring provider: Dr Sherryll Burger   CHIEF COMPLAINT:   Non-massive hemoptysis and mucus plugging of tracheobronchial tree   HISTORY OF PRESENT ILLNESS   This is a 78 year old male he has a history of essential hypertension, dyslipidemia lifelong smoking history centrilobular and paraseptal emphysema with COPD however has not been on oxygen in the past.  Also does have significant systolic CHF and coronary disease status post bypass as well as CKD.  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  Patient comes in with reports of progressive worsening dyspnea shortness of breath wheezing and hemoptysis.  While in the ER he was found to be borderline hypotensive with hypoxemia requiring supplemental oxygen, his SpO2 was 86%.  Blood work showed worsening anemia from a hemoglobin over 11 to now 10.4 on admission also hyponatremia and hyperglycemia.  He had an abnormal x-ray with left lung interstitial opacification.  He had a CT PE performed and this was negative for blood clots however did show infiltrate of the left upper lobe as well as mucous plugging and bronchitic changes bilaterally with previously seen centrilobular and paraseptal emphysema.  He was empirically treated for pneumonia with community-acquired pneumonia regimen including Rocephin and Zithromax, resuscitated with IV fluids and also treated with IV steroid Solu-Medrol.  PCCM consultation was placed for massive hemoptysis with mucous plugging of the tracheobronchial tree worse at the right middle lobe and right lower lobe.  03/19/23- patient on home settings of O2.  He has chronic COPD related cough.  Hemoptysis has resolved. He feels well and wishes to go home.  He may follow up with [facility] pulmonary on outpatient basis. He smokes 1ppd.  Smoking cessation counseling  has been provided today.  I have dcd his rocephin and zithromax IV.  He can continue doxycycline 100 bid x 5 more days and prednisone 40mg  with 5mg  taper daily.    PAST MEDICAL HISTORY   Past Medical History:  Diagnosis Date   Anxiety    Arthritis    CHF (congestive heart failure) (HCC)    Colon polyp    COPD (chronic obstructive pulmonary disease) (HCC)    smoker   Dysrhythmia    Glaucoma    Heart murmur    Hypertension    on  no meds   Low serum vitamin B12 11/18/2020   Melanoma (HCC)    Resected from Left upper arm.    Myocardial infarction [facility]) 2017   Peripheral vascular disease [facility])    Prostate enlargement      SURGICAL HISTORY   Past Surgical History:  Procedure Laterality Date   BACK SURGERY  01/19/98   CARDIAC CATHETERIZATION N/A 08/19/2015   Procedure: Left Heart Cath and Coronary Angiography;  Surgeon: Dalia Heading, MD;  Location: [facility] INVASIVE CV LAB;  Service: Cardiovascular;  Laterality: N/A;   CHOLECYSTECTOMY N/A 08/19/2016   Procedure: LAPAROSCOPIC CHOLECYSTECTOMY WITH INTRAOPERATIVE CHOLANGIOGRAM;  Surgeon: Almond Lint, MD;  Location: [facility] OR;  Service: General;  Laterality: N/A;   COLON SURGERY  age 21 days old   " bowel was twisted up"   COLONOSCOPY WITH PROPOFOL N/A 03/19/2016   Procedure: COLONOSCOPY WITH PROPOFOL;  Surgeon: Christena Deem, MD;  Location: [facility] ENDOSCOPY;  Service:  Endoscopy;  Laterality: N/A;   CORONARY ARTERY BYPASS GRAFT  2017   ERCP N/A 08/18/2016   Procedure: ENDOSCOPIC RETROGRADE CHOLANGIOPANCREATOGRAPHY (ERCP);  Surgeon: Kerin Salen, MD;  Location: [facility] ENDOSCOPY;  Service: Gastroenterology;  Laterality: N/A;   ESOPHAGOGASTRODUODENOSCOPY (EGD) WITH PROPOFOL N/A 09/18/2018   Procedure: ESOPHAGOGASTRODUODENOSCOPY (EGD) WITH PROPOFOL;  Surgeon: Sung Amabile, DO;  Location: [facility] ENDOSCOPY;  Service: General;  Laterality: N/A;   EYE SURGERY Left 03/26/11   LOWER EXTREMITY ANGIOGRAPHY Left 10/03/2017   Procedure: LOWER EXTREMITY  ANGIOGRAPHY;  Surgeon: Annice Needy, MD;  Location: [facility] INVASIVE CV LAB;  Service: Cardiovascular;  Laterality: Left;   LOWER EXTREMITY ANGIOGRAPHY Right 10/31/2017   Procedure: LOWER EXTREMITY ANGIOGRAPHY;  Surgeon: Annice Needy, MD;  Location: [facility] INVASIVE CV LAB;  Service: Cardiovascular;  Laterality: Right;   LUMBAR LAMINECTOMY/DECOMPRESSION MICRODISCECTOMY Right 10/02/2012   Procedure: Right Lumbar Five-Sacral One Lumbar laminotomy/Microdiskectomy;  Surgeon: Hewitt Shorts, MD;  Location: [facility] NEURO ORS;  Service: Neurosurgery;  Laterality: Right;  Right Lumbar Five-Sacral One Lumbar laminotomy/Microdiskectomy   VISCERAL ANGIOGRAPHY N/A 11/08/2020   Procedure: VISCERAL ANGIOGRAPHY;  Surgeon: Renford Dills, MD;  Location: [facility] INVASIVE CV LAB;  Service: Cardiovascular;  Laterality: N/A;   VISCERAL ANGIOGRAPHY N/A 11/25/2020   Procedure: VISCERAL ANGIOGRAPHY;  Surgeon: Renford Dills, MD;  Location: [facility] INVASIVE CV LAB;  Service: Cardiovascular;  Laterality: N/A;     FAMILY HISTORY   Family History  Problem Relation Age of Onset   Heart attack Father    Aneurysm Brother    Lung cancer Brother      SOCIAL HISTORY   Social History   Tobacco Use   Smoking status: Every Day    Current packs/day: 1.00    Average packs/day: 1 pack/day for 58.0 years (58.0 ttl pk-yrs)    Types: Cigarettes   Smokeless tobacco: Never  Vaping Use   Vaping status: Former  Substance Use Topics   Alcohol use: No   Drug use: No     MEDICATIONS    Home Medication:    Current Medication:  Current Facility-Administered Medications:    acetaminophen (TYLENOL) tablet 650 mg, 650 mg, Oral, Q6H PRN, 650 mg at 03/16/23 2243 **OR** acetaminophen (TYLENOL) suppository 650 mg, 650 mg, Rectal, Q6H PRN, Andris Baumann, MD   albuterol (PROVENTIL) (2.5 MG/3ML) 0.083% nebulizer solution 2.5 mg, 2.5 mg, Nebulization, Q4H PRN, Foust, Katy L, NP, 2.5 mg at 03/16/23 1318   ALPRAZolam (XANAX) tablet 1  mg, 1 mg, Oral, QHS PRN, Delfino Lovett, MD, 1 mg at 03/18/23 2205   atorvastatin (LIPITOR) tablet 40 mg, 40 mg, Oral, Daily, Foust, Katy L, NP, 40 mg at 03/17/23 0825   azithromycin (ZITHROMAX) tablet 500 mg, 500 mg, Oral, QHS, Tressie Ellis, RPH, 500 mg at 03/18/23 2205   brimonidine (ALPHAGAN) 0.15 % ophthalmic solution 1 drop, 1 drop, Both Eyes, BID, Foust, Katy L, NP, 1 drop at 03/18/23 2205   busPIRone (BUSPAR) tablet 30 mg, 30 mg, Oral, BID, Foust, Katy L, NP, 30 mg at 03/18/23 2204   cefTRIAXone (ROCEPHIN) 2 g in sodium chloride 0.9 % 100 mL IVPB, 2 g, Intravenous, Q24H, Foust, Katy L, NP, Last Rate: 200 mL/hr at 03/18/23 2330, 2 g at 03/18/23 2330   citalopram (CELEXA) tablet 20 mg, 20 mg, Oral, Daily, Foust, Katy L, NP, 20 mg at 03/17/23 0826   guaiFENesin (MUCINEX) 12 hr tablet 600 mg, 600 mg, Oral, BID, Foust, Katy L, NP, 600 mg at 03/18/23 2205  guaiFENesin-dextromethorphan (ROBITUSSIN DM) 100-10 MG/5ML syrup 5 mL, 5 mL, Oral, Q4H PRN, Andris Baumann, MD, 5 mL at 03/16/23 1318   insulin aspart (novoLOG) injection 0-5 Units, 0-5 Units, Subcutaneous, QHS, Para March, Odetta Pink, MD   insulin aspart (novoLOG) injection 0-9 Units, 0-9 Units, Subcutaneous, TID WC, Andris Baumann, MD, 2 Units at 03/17/23 1808   latanoprost (XALATAN) 0.005 % ophthalmic solution 1 drop, 1 drop, Both Eyes, QHS, Foust, Katy L, NP, 1 drop at 03/18/23 2205   metoprolol succinate (TOPROL-XL) 24 hr tablet 12.5 mg, 12.5 mg, Oral, Daily, Foust, Katy L, NP, 12.5 mg at 03/17/23 0825   nicotine (NICODERM CQ - dosed in mg/24 hours) patch 21 mg, 21 mg, Transdermal, Daily, Sherryll Burger, Vipul, MD, 21 mg at 03/18/23 1007   ondansetron (ZOFRAN) tablet 4 mg, 4 mg, Oral, Q6H PRN **OR** ondansetron (ZOFRAN) injection 4 mg, 4 mg, Intravenous, Q6H PRN, Andris Baumann, MD   Oral care mouth rinse, 15 mL, Mouth Rinse, PRN, Sherryll Burger, Vipul, MD   predniSONE (DELTASONE) tablet 40 mg, 40 mg, Oral, Q breakfast, Foust, Katy L, NP, 40 mg at 03/17/23 0825    QUEtiapine (SEROQUEL) tablet 50 mg, 50 mg, Oral, QHS, Foust, Katy L, NP, 50 mg at 03/18/23 2205   sacubitril-valsartan (ENTRESTO) 49-51 mg per tablet, 1 tablet, Oral, BID, Foust, Katy L, NP, 1 tablet at 03/18/23 2204   tamsulosin (FLOMAX) capsule 0.4 mg, 0.4 mg, Oral, QPC breakfast, Foust, Katy L, NP, 0.4 mg at 03/17/23 0825   traMADol (ULTRAM) tablet 25 mg, 25 mg, Oral, TID PRN, Delfino Lovett, MD    ALLERGIES   Other, Poison ivy extract, and Poison oak extract     REVIEW OF SYSTEMS    Review of Systems:  Gen:  Denies  fever, sweats, chills weigh loss  HEENT: Denies blurred vision, double vision, ear pain, eye pain, hearing loss, nose bleeds, sore throat Cardiac:  No dizziness, chest pain or heaviness, chest tightness,edema Resp:   reports dyspnea chronically  Gi: Denies swallowing difficulty, stomach pain, nausea or vomiting, diarrhea, constipation, bowel incontinence Gu:  Denies bladder incontinence, burning urine Ext:   Denies Joint pain, stiffness or swelling Skin: Denies  skin rash, easy bruising or bleeding or hives Endoc:  Denies polyuria, polydipsia , polyphagia or weight change Psych:   Denies depression, insomnia or hallucinations   Other:  All other systems negative   VS: BP 134/69 (BP Location: Right Arm)   Pulse 79   Temp 98 F (36.7 C) (Oral)   Resp 20   Ht 5\' 8"  (1.727 m)   Wt 51.7 kg   SpO2 94%   BMI 17.33 kg/m      PHYSICAL EXAM    GENERAL:NAD, no fevers, chills, no weakness no fatigue HEAD: Normocephalic, atraumatic.  EYES: Pupils equal, round, reactive to light. Extraocular muscles intact. No scleral icterus.  MOUTH: Moist mucosal membrane. Dentition intact. No abscess noted.  EAR, NOSE, THROAT: Clear without exudates. No external lesions.  NECK: Supple. No thyromegaly. No nodules. No JVD.  PULMONARY: decreased breath sounds with mild rhonchi worse at bases bilaterally.  CARDIOVASCULAR: S1 and S2. Regular rate and rhythm. No murmurs, rubs, or  gallops. No edema. Pedal pulses 2+ bilaterally.  GASTROINTESTINAL: Soft, nontender, nondistended. No masses. Positive bowel sounds. No hepatosplenomegaly.  MUSCULOSKELETAL: No swelling, clubbing, or edema. Range of motion full in all extremities.  NEUROLOGIC: Cranial nerves II through XII are intact. No gross focal neurological deficits. Sensation intact. Reflexes intact.  SKIN: No ulceration,  lesions, rashes, or cyanosis. Skin warm and dry. Turgor intact.  PSYCHIATRIC: Mood, affect within normal limits. The patient is awake, alert and oriented x 3. Insight, judgment intact.       IMAGING     Narrative & Impression  CLINICAL DATA:  Hemoptysis.   EXAM: CT ANGIOGRAPHY CHEST WITH CONTRAST   TECHNIQUE: Multidetector CT imaging of the chest was performed using the standard protocol during bolus administration of intravenous contrast. Multiplanar CT image reconstructions and MIPs were obtained to evaluate the vascular anatomy.   RADIATION DOSE REDUCTION: This exam was performed according to the departmental dose-optimization program which includes automated exposure control, adjustment of the mA and/or kV according to patient size and/or use of iterative reconstruction technique.   CONTRAST:  60mL OMNIPAQUE IOHEXOL 350 MG/ML SOLN   COMPARISON:  Radiograph earlier today.  Chest CT 04/26/2021   FINDINGS: Cardiovascular: There are no filling defects within the pulmonary arteries to suggest pulmonary embolus. The heart is normal in size. There is contrast refluxing into the hepatic veins and IVC. Calcification of native coronary arteries, post median sternotomy. Aortic atherosclerosis without aneurysm. Celiac stent.   Mediastinum/Nodes: Small mediastinal and hilar lymph nodes, not enlarged by size criteria. Unremarkable appearance of the esophagus. No thyroid nodule.   Lungs/Pleura: Patchy and consolidative left upper lobe opacity with areas of mucoid impaction and bronchial  filling. Additional areas of mucoid impaction within the right middle and lower lobe. 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 Areas central bronchial thickening. There is biapical pleuroparenchymal scarring. Bandlike atelectasis or no pleural fluid. Scarring in the right lower lobe.   Upper Abdomen: Contrast refluxes into the hepatic veins and IVC. Detailed assessment is limited due to paucity of intra-abdominal fat.   Musculoskeletal: Scoliosis and mild degenerative change in the spine. Prior median sternotomy. There are no acute or suspicious osseous abnormalities.   Review of the MIP images confirms the above findings.   IMPRESSION: 1. No pulmonary embolus. 2. Patchy and consolidative left upper lobe opacity with areas of mucoid impaction and bronchial filling, suspicious for pneumonia. Additional areas of mucoid impaction within the right middle and lower lobe. 3. Contrast refluxing into the hepatic veins and IVC, can be seen with right heart dysfunction. 4. 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   Aortic Atherosclerosis (ICD10-I70.0) and Emphysema (ICD10-J43.9).     Electronically Signed   By: Narda Rutherford M.D.   On: 03/15/2023 19:47       ASSESSMENT/PLAN   Acute on chronic hypoxemic respiratory failure                   - Left upper lobe community acquired pneumonia                      - c/w doxy x5 d  2. Non-massive hemoptysis- resolved               -  3. Acute exacerbation of COPD          - continue with home nebs            -doxy and prednisone taper   4. Bibasilar Atelectasis               - IS at home and PT  5. Tobacco dependence            Nicotine replacement therapy              -counseling for smoking cessation provided  6. Physical deconditioning with CKD and CHF           Contributing to dyspnea - patient would benefit from pulmonary and cardiac rehab on outpatient            -patient is DNR and DNI with overall  poor prognosis            - BODE score >8 with <20% 5 year survival          Thank you for allowing me to participate in the care of this patient.   Patient/Family are satisfied with care plan and all questions have been answered.    Provider disclosure: Patient with at least one acute or chronic illness or injury that poses a threat to life or bodily function and is being managed actively during this encounter.  All of the below services have been performed independently by signing provider:  review of prior documentation from internal and or external health records.  Review of previous and current lab results.  Interview and comprehensive assessment during patient visit today. Review of current and previous chest radiographs/CT scans. Discussion of management and test interpretation with health care team and patient/family.   This document was prepared using Dragon voice recognition software and may include unintentional dictation errors.     Vida Rigger, M.D.  Division of Pulmonary & Critical Care Medicine

## 2023-03-19 NOTE — Plan of Care (Signed)

## 2023-03-19 NOTE — Progress Notes (Signed)
Sports coach Note  New referral received from Bevelyn Ngo, [facility] for outpatient palliative upon discharge from [facility].  Patient discharged from [facility] today.  Thank you for allowing participation in this patient's care.  Norris Cross, RN Nurse Liaison [phone]

## 2023-03-20 LAB — CULTURE, BAL-QUANTITATIVE W GRAM STAIN: Gram Stain: NONE SEEN

## 2023-03-21 ENCOUNTER — Telehealth: Payer: Self-pay | Admitting: Family

## 2023-03-21 LAB — CULTURE, BLOOD (ROUTINE X 2)
Culture: NO GROWTH
Culture: NO GROWTH
Special Requests: ADEQUATE
Special Requests: ADEQUATE

## 2023-03-23 NOTE — Progress Notes (Deleted)
 MRN : [id number]  Todd Whitehead is a 78 y.o. (1945/07/04) male who presents with chief complaint of check circulation.  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     Procedure 11/25/2020: Stent to the celiac artery origin with 6 mm diameter x 37 mm length balloon expandable lifestream stent postdilated to 7 mm    The patient denies abdominal pain or postprandial symptoms.  The patient denies weight loss as well as nausea.  The patient does not substantiate food fear, particular foods do not seem to aggravate or alleviate the symptoms.  The patient denies bloody bowel movements or diarrhea.  The patient has a history of colonoscopy which was not diagnostic.  No history of peptic ulcer disease.    The patient is also followed for carotid artery stenosis.  The patient denies interval episodes of amaurosis fugax or recent TIA symptoms. There are no recent neurological changes noted.   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 The patient denies worsening of his claudication symptoms or development of rest pain symptoms.   The patient denies history of DVT, PE or superficial thrombophlebitis. The patient denies recent episodes of angina     Noninvasive studies obtained today and reviewed and interpreted by myself are as follows:   ABI's Rt=1.08 and Lt=1.04  (previous ABI's Rt=1.07 and Lt=1.07).   Mesenteric duplex demonstrates the celiac artery and celiac stent are widely patent with uniform velocities.  SMA is widely patent.  IMA is nonvisualized.   Carotid duplex demonstrates RICA 1-39% and LICA 1-39%.  Vertebral arteries are antegrade flow bilaterally.  There is normal flow hemodynamics seen in both  subclavian's.    No outpatient medications have been marked as taking for the 03/25/23 encounter (Appointment) with Gilda Crease, Latina Craver, MD.    Past Medical History:  Diagnosis Date   Anxiety    Arthritis    CHF (congestive heart failure) (HCC)    Colon polyp    COPD (chronic obstructive pulmonary disease) (HCC)    smoker   Dysrhythmia    Glaucoma    Heart murmur    Hypertension    on  no meds   Low serum vitamin B12 11/18/2020   Melanoma (HCC)    Resected from Left upper arm.    [facility] 2017   Peripheral vascular disease [facility])    Prostate enlargement     Past Surgical History:  Procedure Laterality Date   BACK SURGERY  01/19/98   CARDIAC CATHETERIZATION N/A 08/19/2015   Procedure: Left Heart Cath and Coronary Angiography;  Surgeon: Dalia Heading, MD;  Location: [facility] INVASIVE CV LAB;  Service: Cardiovascular;  Laterality: N/A;   CHOLECYSTECTOMY N/A 08/19/2016   Procedure: LAPAROSCOPIC CHOLECYSTECTOMY WITH INTRAOPERATIVE CHOLANGIOGRAM;  Surgeon: Almond Lint, MD;  Location: [facility] OR;  Service: General;  Laterality: N/A;   COLON SURGERY  age 23 days old   " bowel was twisted up"  COLONOSCOPY WITH PROPOFOL N/A 03/19/2016   Procedure: COLONOSCOPY WITH PROPOFOL;  Surgeon: Christena Deem, MD;  Location[facility] ENDOSCOPY;  Service: Endoscopy;  Laterality: N/A;   CORONARY ARTERY BYPASS GRAFT  2017   ERCP N/A 08/18/2016   Procedure: ENDOSCOPIC RETROGRADE CHOLANGIOPANCREATOGRAPHY (ERCP);  Surgeon: Kerin Salen, MD;  Location: [facility] ENDOSCOPY;  Service: Gastroenterology;  Laterality: N/A;   ESOPHAGOGASTRODUODENOSCOPY (EGD) WITH PROPOFOL N/A 09/18/2018   Procedure: ESOPHAGOGASTRODUODENOSCOPY (EGD) WITH PROPOFOL;  Surgeon: Sung Amabile, DO;  Location: [facility] ENDOSCOPY;  Service: General;  Laterality: N/A;   EYE SURGERY Left 03/26/11   FIBEROPTIC BRONCHOSCOPY Bilateral 03/18/2023   Procedure: BEDSIDE BRONCHOSCOPY FIBEROPTIC;  Surgeon: Vida Rigger, MD;  Location: [facility] ORS;   Service: Thoracic;  Laterality: Bilateral;   FLEXIBLE BRONCHOSCOPY Bilateral 03/18/2023   Procedure: FLEXIBLE BRONCHOSCOPY;  Surgeon: Vida Rigger, MD;  Location: [facility] ORS;  Service: Thoracic;  Laterality: Bilateral;   LOWER EXTREMITY ANGIOGRAPHY Left 10/03/2017   Procedure: LOWER EXTREMITY ANGIOGRAPHY;  Surgeon: Annice Needy, MD;  Location: [facility] INVASIVE CV LAB;  Service: Cardiovascular;  Laterality: Left;   LOWER EXTREMITY ANGIOGRAPHY Right 10/31/2017   Procedure: LOWER EXTREMITY ANGIOGRAPHY;  Surgeon: Annice Needy, MD;  Location: [facility] INVASIVE CV LAB;  Service: Cardiovascular;  Laterality: Right;   LUMBAR LAMINECTOMY/DECOMPRESSION MICRODISCECTOMY Right 10/02/2012   Procedure: Right Lumbar Five-Sacral One Lumbar laminotomy/Microdiskectomy;  Surgeon: Hewitt Shorts, MD;  Location: [facility] NEURO ORS;  Service: Neurosurgery;  Laterality: Right;  Right Lumbar Five-Sacral One Lumbar laminotomy/Microdiskectomy   VISCERAL ANGIOGRAPHY N/A 11/08/2020   Procedure: VISCERAL ANGIOGRAPHY;  Surgeon: Renford Dills, MD;  Location: [facility] INVASIVE CV LAB;  Service: Cardiovascular;  Laterality: N/A;   VISCERAL ANGIOGRAPHY N/A 11/25/2020   Procedure: VISCERAL ANGIOGRAPHY;  Surgeon: Renford Dills, MD;  Location: [facility] INVASIVE CV LAB;  Service: Cardiovascular;  Laterality: N/A;    Social History Social History   Tobacco Use   Smoking status: Every Day    Current packs/day: 1.00    Average packs/day: 1 pack/day for 58.0 years (58.0 ttl pk-yrs)    Types: Cigarettes   Smokeless tobacco: Never  Vaping Use   Vaping status: Former  Substance Use Topics   Alcohol use: No   Drug use: No    Family History Family History  Problem Relation Age of Onset   Heart attack Father    Aneurysm Brother    Lung cancer Brother     Allergies  Allergen Reactions   Other Nausea And Vomiting    Cheese,butter,sour cream,cottage cheese   Poison Ivy Extract Rash   Poison Oak Extract Rash     REVIEW OF SYSTEMS  (Negative unless checked)  Constitutional: [] Weight loss  [] Fever  [] Chills Cardiac: [] Chest pain   [] Chest pressure   [] Palpitations   [] Shortness of breath when laying flat   [] Shortness of breath with exertion. Vascular:  [x] Pain in legs with walking   [] Pain in legs at rest  [] History of DVT   [] Phlebitis   [] Swelling in legs   [] Varicose veins   [] Non-healing ulcers Pulmonary:   [] Uses home oxygen   [] Productive cough   [] Hemoptysis   [] Wheeze  [] COPD   [] Asthma Neurologic:  [] Dizziness   [] Seizures   [] History of stroke   [] History of TIA  [] Aphasia   [] Vissual changes   [] Weakness or numbness in arm   [] Weakness or numbness in leg Musculoskeletal:   [] Joint swelling   [] Joint pain   [] Low back pain Hematologic:  [] Easy bruising  [] Easy bleeding   [] Hypercoagulable  state   [] Anemic Gastrointestinal:  [] Diarrhea   [] Vomiting  [] Gastroesophageal reflux/heartburn   [] Difficulty swallowing. Genitourinary:  [] Chronic kidney disease   [] Difficult urination  [] Frequent urination   [] Blood in urine Skin:  [] Rashes   [] Ulcers  Psychological:  [] History of anxiety   []  History of major depression.  Physical Examination  There were no vitals filed for this visit. There is no height or weight on file to calculate BMI. Gen: WD/WN, NAD Head: Mier/AT, No temporalis wasting.  Ear/Nose/Throat: Hearing grossly intact, nares w/o erythema or drainage Eyes: PER, EOMI, sclera nonicteric.  Neck: Supple, no masses.  No bruit or JVD.  Pulmonary:  Good air movement, no audible wheezing, no use of accessory muscles.  Cardiac: RRR, normal S1, S2, no Murmurs. Vascular:  mild trophic changes, no open wounds Vessel Right Left  Radial Palpable Palpable  PT Not Palpable Not Palpable  DP Not Palpable Not Palpable  Gastrointestinal: soft, non-distended. No guarding/no peritoneal signs.  Musculoskeletal: M/S 5/5 throughout.  No visible deformity.  Neurologic: CN 2-12 intact. Pain and light touch intact in  extremities.  Symmetrical.  Speech is fluent. Motor exam as listed above. Psychiatric: Judgment intact, Mood & affect appropriate for pt's clinical situation. Dermatologic: No rashes or ulcers noted.  No changes consistent with cellulitis.   CBC Lab Results  Component Value Date   WBC 10.8 (H) 03/19/2023   HGB 9.6 (L) 03/19/2023   HCT 27.8 (L) 03/19/2023   MCV 89.1 03/19/2023   PLT 195 03/19/2023    BMET    Component Value Date/Time   NA 137 03/19/2023 0531   NA 138 07/06/2020 1505   K 3.9 03/19/2023 0531   CL 105 03/19/2023 0531   CO2 25 03/19/2023 0531   GLUCOSE 88 03/19/2023 0531   BUN 17 03/19/2023 0531   BUN 18 07/06/2020 1505   CREATININE 0.96 03/19/2023 0531   CALCIUM 8.9 03/19/2023 0531   GFRNONAA >60 03/19/2023 0531   GFRAA 78 10/30/2019 1424   Estimated Creatinine Clearance: 47.1 mL/min (by C-G formula based on SCr of 0.96 mg/dL).  COAG Lab Results  Component Value Date   INR 1.0 03/16/2023   INR 1.1 01/26/2021   INR 1.1 01/26/2021    Radiology DG Chest Port 1 View Result Date: 03/18/2023 CLINICAL DATA:  Status post bronchoscopy EXAM: PORTABLE CHEST 1 VIEW COMPARISON:  03/15/2023 FINDINGS: Prior CABG. Heart and mediastinal contours within normal limits. Aortic atherosclerosis. There is hyperinflation of the lungs compatible with COPD. Patchy opacities in the left upper lobe, similar to prior study. No confluent opacity on the right. No effusions. No pneumothorax. IMPRESSION: No pneumothorax following bronchoscopy. Hyperinflation/COPD. Stable left upper lobe opacity. Electronically Signed   By: Charlett Nose M.D.   On: 03/18/2023 18:29   CT Angio Chest Pulmonary Embolism (PE) W or WO Contrast Result Date: 03/15/2023 CLINICAL DATA:  Hemoptysis. EXAM: CT ANGIOGRAPHY CHEST WITH CONTRAST TECHNIQUE: Multidetector CT imaging of the chest was performed using the standard protocol during bolus administration of intravenous contrast. Multiplanar CT image reconstructions  and MIPs were obtained to evaluate the vascular anatomy. RADIATION DOSE REDUCTION: This exam was performed according to the departmental dose-optimization program which includes automated exposure control, adjustment of the mA and/or kV according to patient size and/or use of iterative reconstruction technique. CONTRAST:  60mL OMNIPAQUE IOHEXOL 350 MG/ML SOLN COMPARISON:  Radiograph earlier today.  Chest CT 04/26/2021 FINDINGS: Cardiovascular: There are no filling defects within the pulmonary arteries to suggest pulmonary embolus. The heart is  normal in size. There is contrast refluxing into the hepatic veins and IVC. Calcification of native coronary arteries, post median sternotomy. Aortic atherosclerosis without aneurysm. Celiac stent. Mediastinum/Nodes: Small mediastinal and hilar lymph nodes, not enlarged by size criteria. Unremarkable appearance of the esophagus. No thyroid nodule. Lungs/Pleura: Patchy and consolidative left upper lobe opacity with areas of mucoid impaction and bronchial filling. Additional areas of mucoid impaction within the right middle and lower lobe. 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 Areas central bronchial thickening. There is biapical pleuroparenchymal scarring. Bandlike atelectasis or no pleural fluid. Scarring in the right lower lobe. Upper Abdomen: Contrast refluxes into the hepatic veins and IVC. Detailed assessment is limited due to paucity of intra-abdominal fat. Musculoskeletal: Scoliosis and mild degenerative change in the spine. Prior median sternotomy. There are no acute or suspicious osseous abnormalities. Review of the MIP images confirms the above findings. IMPRESSION: 1. No pulmonary embolus. 2. Patchy and consolidative left upper lobe opacity with areas of mucoid impaction and bronchial filling, suspicious for pneumonia. Additional areas of mucoid impaction within the right middle and lower lobe. 3. Contrast refluxing into the hepatic veins and IVC, can be seen with right  heart dysfunction. 4. 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 Aortic Atherosclerosis (ICD10-I70.0) and Emphysema (ICD10-J43.9). Electronically Signed   By: Narda Rutherford M.D.   On: 03/15/2023 19:47   DG Chest 2 View Result Date: 03/15/2023 CLINICAL DATA:  Hemoptysis. EXAM: CHEST - 2 VIEW COMPARISON:  Radiograph 08/03/2021 chest CT 04/26/2021 FINDINGS: Normal heart size. Post median sternotomy with epicardial wires in place. Chronic hyperinflation and emphysema. Ill-defined patchy left suprahilar opacity. No pneumothorax or pleural effusion. No acute osseous findings. IMPRESSION: 1. Patchy left suprahilar opacity suspicious for pneumonia. 2. Chronic hyperinflation and emphysema. Electronically Signed   By: Narda Rutherford M.D.   On: 03/15/2023 19:26     Assessment/Plan There are no diagnoses linked to this encounter.   Levora Dredge, MD  03/23/2023 3:41 PM

## 2023-03-25 ENCOUNTER — Ambulatory Visit (INDEPENDENT_AMBULATORY_CARE_PROVIDER_SITE_OTHER): Payer: Medicare Other | Admitting: Vascular Surgery

## 2023-03-25 ENCOUNTER — Encounter (INDEPENDENT_AMBULATORY_CARE_PROVIDER_SITE_OTHER): Payer: Medicare Other

## 2023-03-25 DIAGNOSIS — K551 Chronic vascular disorders of intestine: Secondary | ICD-10-CM

## 2023-03-25 DIAGNOSIS — I739 Peripheral vascular disease, unspecified: Secondary | ICD-10-CM

## 2023-03-25 DIAGNOSIS — I10 Essential (primary) hypertension: Secondary | ICD-10-CM

## 2023-03-25 DIAGNOSIS — I25118 Atherosclerotic heart disease of native coronary artery with other forms of angina pectoris: Secondary | ICD-10-CM

## 2023-03-25 DIAGNOSIS — I779 Disorder of arteries and arterioles, unspecified: Secondary | ICD-10-CM

## 2023-03-25 NOTE — Anesthesia Postprocedure Evaluation (Signed)
 Anesthesia Post Note  Patient: Mikias Lanz  Procedure(s) Performed: FLEXIBLE BRONCHOSCOPY (Bilateral) BEDSIDE BRONCHOSCOPY FIBEROPTIC (Bilateral)  Patient location during evaluation: PACU Anesthesia Type: General Level of consciousness: awake and alert Pain management: pain level controlled Vital Signs Assessment: post-procedure vital signs reviewed and stable Respiratory status: spontaneous breathing, nonlabored ventilation, respiratory function stable and patient connected to nasal cannula oxygen Cardiovascular status: blood pressure returned to baseline and stable Postop Assessment: no apparent nausea or vomiting Anesthetic complications: no   No notable events documented.   Last Vitals:  Vitals:   03/19/23 0338 03/19/23 0742  BP: 134/69 (!) 169/138  Pulse: 79 88  Resp: 20 16  Temp: 36.7 C 36.6 C  SpO2: 94% 94%    Last Pain:  Vitals:   03/19/23 0742  TempSrc: Oral  PainSc: 0-No pain                 Yevette Edwards

## 2023-03-26 ENCOUNTER — Inpatient Hospital Stay (HOSPITAL_BASED_OUTPATIENT_CLINIC_OR_DEPARTMENT_OTHER): Payer: Medicare Other | Admitting: Oncology

## 2023-03-26 ENCOUNTER — Encounter: Payer: Self-pay | Admitting: Oncology

## 2023-03-26 ENCOUNTER — Inpatient Hospital Stay: Payer: Medicare Other

## 2023-03-26 VITALS — BP 143/68 | HR 66 | Temp 98.5°F | Resp 18

## 2023-03-26 DIAGNOSIS — D649 Anemia, unspecified: Secondary | ICD-10-CM | POA: Diagnosis not present

## 2023-03-26 DIAGNOSIS — F17210 Nicotine dependence, cigarettes, uncomplicated: Secondary | ICD-10-CM | POA: Diagnosis not present

## 2023-03-26 DIAGNOSIS — R109 Unspecified abdominal pain: Secondary | ICD-10-CM | POA: Diagnosis not present

## 2023-03-26 DIAGNOSIS — R110 Nausea: Secondary | ICD-10-CM | POA: Diagnosis not present

## 2023-03-26 DIAGNOSIS — D472 Monoclonal gammopathy: Secondary | ICD-10-CM

## 2023-03-26 DIAGNOSIS — R634 Abnormal weight loss: Secondary | ICD-10-CM | POA: Diagnosis not present

## 2023-03-26 DIAGNOSIS — Z9049 Acquired absence of other specified parts of digestive tract: Secondary | ICD-10-CM | POA: Diagnosis not present

## 2023-03-26 DIAGNOSIS — Z801 Family history of malignant neoplasm of trachea, bronchus and lung: Secondary | ICD-10-CM | POA: Diagnosis not present

## 2023-03-26 DIAGNOSIS — Z720 Tobacco use: Secondary | ICD-10-CM

## 2023-03-26 DIAGNOSIS — G8929 Other chronic pain: Secondary | ICD-10-CM | POA: Diagnosis not present

## 2023-03-26 DIAGNOSIS — Z85820 Personal history of malignant melanoma of skin: Secondary | ICD-10-CM | POA: Diagnosis not present

## 2023-03-26 LAB — CBC WITH DIFFERENTIAL (CANCER CENTER ONLY)
Abs Immature Granulocytes: 0.09 10*3/uL — ABNORMAL HIGH (ref 0.00–0.07)
Basophils Absolute: 0 10*3/uL (ref 0.0–0.1)
Basophils Relative: 0 %
Eosinophils Absolute: 0.1 10*3/uL (ref 0.0–0.5)
Eosinophils Relative: 1 %
HCT: 29.5 % — ABNORMAL LOW (ref 39.0–52.0)
Hemoglobin: 9.9 g/dL — ABNORMAL LOW (ref 13.0–17.0)
Immature Granulocytes: 1 %
Lymphocytes Relative: 15 %
Lymphs Abs: 1.9 10*3/uL (ref 0.7–4.0)
MCH: 30.4 pg (ref 26.0–34.0)
MCHC: 33.6 g/dL (ref 30.0–36.0)
MCV: 90.5 fL (ref 80.0–100.0)
Monocytes Absolute: 1.5 10*3/uL — ABNORMAL HIGH (ref 0.1–1.0)
Monocytes Relative: 12 %
Neutro Abs: 9.1 10*3/uL — ABNORMAL HIGH (ref 1.7–7.7)
Neutrophils Relative %: 71 %
Platelet Count: 298 10*3/uL (ref 150–400)
RBC: 3.26 MIL/uL — ABNORMAL LOW (ref 4.22–5.81)
RDW: 13.9 % (ref 11.5–15.5)
WBC Count: 12.7 10*3/uL — ABNORMAL HIGH (ref 4.0–10.5)
nRBC: 0 % (ref 0.0–0.2)

## 2023-03-26 LAB — RETIC PANEL
Immature Retic Fract: 15.7 % (ref 2.3–15.9)
RBC.: 3.22 MIL/uL — ABNORMAL LOW (ref 4.22–5.81)
Retic Count, Absolute: 51.5 10*3/uL (ref 19.0–186.0)
Retic Ct Pct: 1.6 % (ref 0.4–3.1)
Reticulocyte Hemoglobin: 34.1 pg (ref >27.9)

## 2023-03-26 LAB — IRON AND TIBC
Iron: 88 ug/dL (ref 45–182)
Saturation Ratios: 32 % (ref 17.9–39.5)
TIBC: 277 ug/dL (ref 250–450)
UIBC: 189 ug/dL

## 2023-03-26 LAB — FERRITIN: Ferritin: 93 ng/mL (ref 24–336)

## 2023-03-26 LAB — FOLATE: Folate: >40 ng/mL (ref >5.9)

## 2023-03-26 LAB — VITAMIN B12: Vitamin B-12: 802 pg/mL (ref 180–914)

## 2023-03-26 NOTE — Assessment & Plan Note (Addendum)
 Recommend smoke cessation.

## 2023-03-26 NOTE — Assessment & Plan Note (Addendum)
 Check B12, Folate, iron tibc ferritin  Iron panel is not consistent with iron deficiency. B12 is pending.

## 2023-03-26 NOTE — Progress Notes (Signed)
 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 Lab Results  Component Value Date   MPROTEIN 0.4 (H) 08/17/2022   KPAFRELGTCHN 28.1 (H) 08/17/2022   LAMBDASER 51.0 (H) 08/17/2022   KAPLAMBRATIO 0.55 08/17/2022     Today's MGUS labs are pending.  Recommend observation, repeat labs in 6 months. Check skeletal survey  Anemia Check B12, Folate, iron tibc ferritin  Iron panel is not consistent with iron deficiency. B12 is pending.   Tobacco abuse Recommend smoke cessation.    No orders of the defined types were placed in this encounter.  Follow up in 6 months.  All questions were answered. The patient knows to call the clinic with any problems, questions or concerns.  Rickard Patience, MD, PhD [facility] Hematology Oncology 03/26/2023   HISTORY OF PRESENTING ILLNESS:   Todd Whitehead is a  78 y.o.  male presents for follow up  Patient was accompanied by her friend Ms. Williams. 8/31 2022, CBC showed a hemoglobin of 10.3, hematocrit 29.5, WBC 5.7  10/20/2020, iron panel showed decreased iron saturation of 12, ferritin 140, TIBC 265.  Patient reports feeling fatigued and tired.  Denies any hematemesis or hematochezia. Patient has chronic abdominal pain, and only is able to eat 1 meal per day.  Patient has significant weight loss as well as some nausea.  07/06/2020 CT abdomen pelvis showed no acute findings.  No explanation of patient's unintended weight loss.  Mucous plugging seen within the right lower lobe bronchi with minimal right base atelectasis or early infiltrate. Patient follows up with gastroenterology Dr.Tahiliani, has also been seen by surgery to [facility].  Surgical intervention was not recommended.   09/13/2020, CT abdomen pelvis with contrast at  [facility] showed extensive atherosclerotic disease.  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  Previous cholecystectomy.  Prominence, bile duct.  Cyst of both kidneys.  Negative for hydronephrosis.  Moderate distention urinary bladder.  Questionable bladder outlet obstruction.  Prostate gland is mildly enlarged and heterogeneous. Patient was last seen by Dr. Maximino Greenland on 10/20/2020.  GI recommends EGD and colonoscopy for further evaluation of his symptoms. Patient was seen by vascular surgery Dr.Schnier on 10/24/2020.  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 11/08/2020, aortogram showed a diffusely diseased no hemodynamically significant lesions identified.  SMA was widely patent.  Celiac demonstrates greater than 90% stenosis at its origin but distal to the stenosis appears to be widely patent.  Patient will be rescheduled with plan of upper extremity access which will allow leverage enabling treatment of the lesion and revascularization and stent placement.  Patient was referred to establish care with hematology for evaluation of anemia.  01/26/2021 - 01/30/2021, patient was hospitalized due to hemoptysis, acute on chronic systolic CHF, community-acquired pneumonia, acute hypoxic respiratory failure.  2D echo showed EF 45-50%, grade 1 diastolic dysfunction.  Patient follows up with pulmonology for a suspicious nodule in his lung.  04/27/2021, CT chest without contrast showed interval resolution of the previously seen spiculated nodule of the dependent right lung base.  As well as bilateral airspace disease.  Consistent with resolution of infection or inflammation.  Severe emphysema, diffuse bronchial wall thickening.  CAD.  # History of melanoma  INTERVAL HISTORY Todd Whitehead is a 78 y.o. male who has above history reviewed by me today presents  for follow up of MGUS, anemia No fever, chills, nausea, vomiting.  He has decreased alcohol drinking, only socially.  03/15/2023 - 03/19/2023  patient was hospitalized due to community-acquired pneumonia.  Patient has finished antibiotics course.  Symptoms have resolved.  Review of Systems  Constitutional:  Positive for fatigue. Negative for appetite change, chills, diaphoresis, fever and unexpected weight change.  HENT:   Negative for hearing loss, lump/mass, nosebleeds and sore throat.   Eyes:  Negative for eye problems and icterus.  Respiratory:  Negative for chest tightness, cough, hemoptysis, shortness of breath and wheezing.   Cardiovascular:  Negative for chest pain and leg swelling.  Gastrointestinal:  Negative for abdominal distention, abdominal pain, blood in stool, diarrhea, nausea and rectal pain.  Endocrine: Negative for hot flashes.  Genitourinary:  Negative for bladder incontinence, difficulty urinating, dysuria, frequency, hematuria and nocturia.   Musculoskeletal:  Negative for back pain, flank pain, gait problem and myalgias.  Skin:  Negative for rash.  Neurological:  Negative for dizziness, gait problem, headaches, numbness and seizures.  Hematological:  Negative for adenopathy. Does not bruise/bleed easily.  Psychiatric/Behavioral:  Negative for confusion and decreased concentration. The patient is not nervous/anxious.     MEDICAL HISTORY:  Past Medical History:  Diagnosis Date   Anxiety    Arthritis    CHF (congestive heart failure) (HCC)    Colon polyp    COPD (chronic obstructive pulmonary disease) (HCC)    smoker   Dysrhythmia    Glaucoma    Heart murmur    Hypertension    on  no meds   Low serum vitamin B12 11/18/2020   Melanoma (HCC)    Resected from Left upper arm.    Myocardial infarction [facility]) 2017   Peripheral vascular disease [facility])    Prostate enlargement     SURGICAL HISTORY: Past Surgical History:  Procedure Laterality Date   BACK SURGERY  01/19/98   CARDIAC CATHETERIZATION N/A 08/19/2015   Procedure: Left Heart Cath and Coronary Angiography;  Surgeon: Dalia Heading, MD;   Location: [facility] INVASIVE CV LAB;  Service: Cardiovascular;  Laterality: N/A;   CHOLECYSTECTOMY N/A 08/19/2016   Procedure: LAPAROSCOPIC CHOLECYSTECTOMY WITH INTRAOPERATIVE CHOLANGIOGRAM;  Surgeon: Almond Lint, MD;  Location: [facility] OR;  Service: General;  Laterality: N/A;   COLON SURGERY  age 42 days old   " bowel was twisted up"   COLONOSCOPY WITH PROPOFOL N/A 03/19/2016   Procedure: COLONOSCOPY WITH PROPOFOL;  Surgeon: Christena Deem, MD;  Location: [facility] ENDOSCOPY;  Service: Endoscopy;  Laterality: N/A;   CORONARY ARTERY BYPASS GRAFT  2017   ERCP N/A 08/18/2016   Procedure: ENDOSCOPIC RETROGRADE CHOLANGIOPANCREATOGRAPHY (ERCP);  Surgeon: Kerin Salen, MD;  Location: [facility] ENDOSCOPY;  Service: Gastroenterology;  Laterality: N/A;   ESOPHAGOGASTRODUODENOSCOPY (EGD) WITH PROPOFOL N/A 09/18/2018   Procedure: ESOPHAGOGASTRODUODENOSCOPY (EGD) WITH PROPOFOL;  Surgeon: Sung Amabile, DO;  Location: [facility] ENDOSCOPY;  Service: General;  Laterality: N/A;   EYE SURGERY Left 03/26/11   FIBEROPTIC BRONCHOSCOPY Bilateral 03/18/2023   Procedure: BEDSIDE BRONCHOSCOPY FIBEROPTIC;  Surgeon: Vida Rigger, MD;  Location: [facility] ORS;  Service: Thoracic;  Laterality: Bilateral;   FLEXIBLE BRONCHOSCOPY Bilateral 03/18/2023   Procedure: FLEXIBLE BRONCHOSCOPY;  Surgeon: Vida Rigger, MD;  Location: [facility] ORS;  Service: Thoracic;  Laterality: Bilateral;   LOWER EXTREMITY ANGIOGRAPHY Left 10/03/2017   Procedure: LOWER EXTREMITY ANGIOGRAPHY;  Surgeon: Annice Needy, MD;  Location: [facility] INVASIVE CV LAB;  Service: Cardiovascular;  Laterality: Left;   LOWER EXTREMITY ANGIOGRAPHY Right 10/31/2017   Procedure:  LOWER EXTREMITY ANGIOGRAPHY;  Surgeon: Annice Needy, MD;  Location: [facility] INVASIVE CV LAB;  Service: Cardiovascular;  Laterality: Right;   LUMBAR LAMINECTOMY/DECOMPRESSION MICRODISCECTOMY Right 10/02/2012   Procedure: Right Lumbar Five-Sacral One Lumbar laminotomy/Microdiskectomy;  Surgeon: Hewitt Shorts, MD;  Location: [facility] NEURO ORS;   Service: Neurosurgery;  Laterality: Right;  Right Lumbar Five-Sacral One Lumbar laminotomy/Microdiskectomy   VISCERAL ANGIOGRAPHY N/A 11/08/2020   Procedure: VISCERAL ANGIOGRAPHY;  Surgeon: Renford Dills, MD;  Location: [facility] INVASIVE CV LAB;  Service: Cardiovascular;  Laterality: N/A;   VISCERAL ANGIOGRAPHY N/A 11/25/2020   Procedure: VISCERAL ANGIOGRAPHY;  Surgeon: Renford Dills, MD;  Location: [facility] INVASIVE CV LAB;  Service: Cardiovascular;  Laterality: N/A;    SOCIAL HISTORY: Social History   Socioeconomic History   Marital status: Divorced    Spouse name: Not on file   Number of children: Not on file   Years of education: Not on file   Highest education level: Not on file  Occupational History   Not on file  Tobacco Use   Smoking status: Every Day    Current packs/day: 1.00    Average packs/day: 1 pack/day for 58.0 years (58.0 ttl pk-yrs)    Types: Cigarettes   Smokeless tobacco: Never  Vaping Use   Vaping status: Former  Substance and Sexual Activity   Alcohol use: No   Drug use: No   Sexual activity: Not Currently  Other Topics Concern   Not on file  Social History Narrative   Not on file   Social Drivers of Health   Financial Resource Strain: Not on file  Food Insecurity: No Food Insecurity (03/16/2023)   Hunger Vital Sign    Worried About Running Out of Food in the Last Year: Never true    Ran Out of Food in the Last Year: Never true  Transportation Needs: No Transportation Needs (03/16/2023)   PRAPARE - Administrator, Civil Service (Medical): No    Lack of Transportation (Non-Medical): No  Physical Activity: Not on file  Stress: Not on file  Social Connections: Moderately Integrated (03/16/2023)   Social Connection and Isolation Panel [NHANES]    Frequency of Communication with Friends and Family: Three times a week    Frequency of Social Gatherings with Friends and Family: Once a week    Attends Religious Services: More than 4 times per  year    Active Member of [employer] or Organizations: Yes    Attends Banker Meetings: Never    Marital Status: Divorced  Catering manager Violence: Not At Risk (03/16/2023)   Humiliation, Afraid, Rape, and Kick questionnaire    Fear of Current or Ex-Partner: No    Emotionally Abused: No    Physically Abused: No    Sexually Abused: No    FAMILY HISTORY: Family History  Problem Relation Age of Onset   Heart attack Father    Aneurysm Brother    Lung cancer Brother     ALLERGIES:  is allergic to other, poison ivy extract, and poison oak extract.  MEDICATIONS:  Current Outpatient Medications  Medication Sig Dispense Refill   acetaminophen (TYLENOL) 500 MG tablet Take 1 tablet (500 mg total) by mouth every 6 (six) hours as needed. 30 tablet 0   albuterol (PROVENTIL) (2.5 MG/3ML) 0.083% nebulizer solution Take 3 mLs (2.5 mg total) by nebulization every 6 (six) hours as needed for wheezing or shortness of breath. 75 mL 12   ALPRAZolam (XANAX) 0.25 MG tablet Take by mouth  at bedtime as needed for sleep.     aspirin EC 81 MG tablet Take 81 mg by mouth daily. Swallow whole.     atorvastatin (LIPITOR) 40 MG tablet Take 40 mg by mouth daily.      brimonidine (ALPHAGAN) 0.15 % ophthalmic solution Place 1 drop into both eyes 2 (two) times daily.     busPIRone (BUSPAR) 30 MG tablet Take 30 mg by mouth 2 (two) times daily.     citalopram (CELEXA) 20 MG tablet Take 20 mg by mouth daily.     clopidogrel (PLAVIX) 75 MG tablet TAKE 1 TABLET(75 MG) BY MOUTH DAILY 30 tablet 5   dapagliflozin propanediol (FARXIGA) 10 MG TABS tablet Take 1 tablet (10 mg total) by mouth daily before breakfast. 90 tablet 3   Dextromethorphan-guaiFENesin 20-200 MG/20ML LIQD Take 10 mLs by mouth every 4 (four) hours as needed. 118 mL 0   ferrous sulfate 325 (65 FE) MG EC tablet TAKE 1 TABLET(325 MG) BY MOUTH TWICE DAILY WITH A MEAL 180 tablet 1   guaiFENesin (MUCINEX) 600 MG 12 hr tablet Take 1 tablet (600 mg total)  by mouth 2 (two) times daily for 5 days. 40 tablet 0   latanoprost (XALATAN) 0.005 % ophthalmic solution Place 1 drop into both eyes at bedtime.   2   metoprolol succinate (TOPROL-XL) 25 MG 24 hr tablet Take 12.5 mg by mouth daily.     Multiple Vitamin (MULTIVITAMIN WITH MINERALS) TABS tablet Take 1 tablet by mouth daily. 30 tablet 0   sacubitril-valsartan (ENTRESTO) 49-51 MG Take 1 tablet by mouth 2 (two) times daily. 180 tablet 3   tamsulosin (FLOMAX) 0.4 MG CAPS capsule Take 1 capsule (0.4 mg total) by mouth daily after breakfast. 30 capsule 2   traZODone (DESYREL) 50 MG tablet Take 50 mg by mouth at bedtime.     No current facility-administered medications for this visit.     PHYSICAL EXAMINATION: ECOG PERFORMANCE STATUS: 2 - Symptomatic, <50% confined to bed Vitals:   03/26/23 1020 03/26/23 1023  BP: (!) 148/117 (!) 143/68  Pulse: 66   Resp: 18   Temp: 98.5 F (36.9 C)    There were no vitals filed for this visit.   Physical Exam Constitutional:      General: He is not in acute distress.    Comments: Patient ambulates independently  HENT:     Head: Normocephalic.  Eyes:     General: No scleral icterus. Cardiovascular:     Rate and Rhythm: Normal rate and regular rhythm.  Pulmonary:     Effort: Pulmonary effort is normal.     Comments: Decreased breath sound bilaterally. Abdominal:     General: Bowel sounds are normal. There is no distension.     Palpations: Abdomen is soft.  Musculoskeletal:        General: No deformity. Normal range of motion.     Cervical back: Normal range of motion.  Skin:    General: Skin is warm and dry.  Neurological:     Mental Status: He is alert and oriented to person, place, and time. Mental status is at baseline.     Cranial Nerves: No cranial nerve deficit.  Psychiatric:        Mood and Affect: Mood normal.     LABORATORY DATA:  I have reviewed the data as listed    Latest Ref Rng & Units 03/26/2023   11:03 AM 03/19/2023     5:31 AM 03/18/2023    5:55  AM  CBC  WBC 4.0 - 10.5 K/uL 12.7  10.8  12.7   Hemoglobin 13.0 - 17.0 g/dL 9.9  9.6  78.2   Hematocrit 39.0 - 52.0 % 29.5  27.8  30.6   Platelets 150 - 400 K/uL 298  195  185       Latest Ref Rng & Units 03/19/2023    5:31 AM 03/18/2023    5:55 AM 03/15/2023    5:08 PM  CMP  Glucose 70 - 99 mg/dL 88  956  213   BUN 8 - 23 mg/dL 17  19  27    Creatinine 0.61 - 1.24 mg/dL 0.86  5.78  4.69   Sodium 135 - 145 mmol/L 137  138  130   Potassium 3.5 - 5.1 mmol/L 3.9  3.8  4.0   Chloride 98 - 111 mmol/L 105  105  95   CO2 22 - 32 mmol/L 25  27  22    Calcium 8.9 - 10.3 mg/dL 8.9  9.0  9.1   Total Protein 6.5 - 8.1 g/dL   6.8   Total Bilirubin 0.0 - 1.2 mg/dL   0.8   Alkaline Phos 38 - 126 U/L   73   AST 15 - 41 U/L   33   ALT 0 - 44 U/L   19     RADIOGRAPHIC STUDIES: I have personally reviewed the radiological images as listed and agreed with the findings in the report. DG Chest Port 1 View Result Date: 03/18/2023 CLINICAL DATA:  Status post bronchoscopy EXAM: PORTABLE CHEST 1 VIEW COMPARISON:  03/15/2023 FINDINGS: Prior CABG. Heart and mediastinal contours within normal limits. Aortic atherosclerosis. There is hyperinflation of the lungs compatible with COPD. Patchy opacities in the left upper lobe, similar to prior study. No confluent opacity on the right. No effusions. No pneumothorax. IMPRESSION: No pneumothorax following bronchoscopy. Hyperinflation/COPD. Stable left upper lobe opacity. Electronically Signed   By: Charlett Nose M.D.   On: 03/18/2023 18:29   CT Angio Chest Pulmonary Embolism (PE) W or WO Contrast Result Date: 03/15/2023 CLINICAL DATA:  Hemoptysis. EXAM: CT ANGIOGRAPHY CHEST WITH CONTRAST TECHNIQUE: Multidetector CT imaging of the chest was performed using the standard protocol during bolus administration of intravenous contrast. Multiplanar CT image reconstructions and MIPs were obtained to evaluate the vascular anatomy. RADIATION DOSE REDUCTION:  This exam was performed according to the departmental dose-optimization program which includes automated exposure control, adjustment of the mA and/or kV according to patient size and/or use of iterative reconstruction technique. CONTRAST:  60mL OMNIPAQUE IOHEXOL 350 MG/ML SOLN COMPARISON:  Radiograph earlier today.  Chest CT 04/26/2021 FINDINGS: Cardiovascular: There are no filling defects within the pulmonary arteries to suggest pulmonary embolus. The heart is normal in size. There is contrast refluxing into the hepatic veins and IVC. Calcification of native coronary arteries, post median sternotomy. Aortic atherosclerosis without aneurysm. Celiac stent. Mediastinum/Nodes: Small mediastinal and hilar lymph nodes, not enlarged by size criteria. Unremarkable appearance of the esophagus. No thyroid nodule. Lungs/Pleura: Patchy and consolidative left upper lobe opacity with areas of mucoid impaction and bronchial filling. Additional areas of mucoid impaction within the right middle and lower lobe. 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 Areas central bronchial thickening. There is biapical pleuroparenchymal scarring. Bandlike atelectasis or no pleural fluid. Scarring in the right lower lobe. Upper Abdomen: Contrast refluxes into the hepatic veins and IVC. Detailed assessment is limited due to paucity of intra-abdominal fat. Musculoskeletal: Scoliosis and mild degenerative change in the spine. Prior median sternotomy.  There are no acute or suspicious osseous abnormalities. Review of the MIP images confirms the above findings. IMPRESSION: 1. No pulmonary embolus. 2. Patchy and consolidative left upper lobe opacity with areas of mucoid impaction and bronchial filling, suspicious for pneumonia. Additional areas of mucoid impaction within the right middle and lower lobe. 3. Contrast refluxing into the hepatic veins and IVC, can be seen with right heart dysfunction. 4. Moderate to advanced emphysema with central bronchial  thickening. Aortic Atherosclerosis (ICD10-I70.0) and Emphysema (ICD10-J43.9). Electronically Signed   By: Narda Rutherford M.D.   On: 03/15/2023 19:47   DG Chest 2 View Result Date: 03/15/2023 CLINICAL DATA:  Hemoptysis. EXAM: CHEST - 2 VIEW COMPARISON:  Radiograph 08/03/2021 chest CT 04/26/2021 FINDINGS: Normal heart size. Post median sternotomy with epicardial wires in place. Chronic hyperinflation and emphysema. Ill-defined patchy left suprahilar opacity. No pneumothorax or pleural effusion. No acute osseous findings. IMPRESSION: 1. Patchy left suprahilar opacity suspicious for pneumonia. 2. Chronic hyperinflation and emphysema. Electronically Signed   By: Narda Rutherford M.D.   On: 03/15/2023 19:26

## 2023-03-26 NOTE — Assessment & Plan Note (Addendum)
 IgG kappa MGUS Labs are reviewed and discussed with patient. Lab Results  Component Value Date   MPROTEIN 0.4 (H) 08/17/2022   KPAFRELGTCHN 28.1 (H) 08/17/2022   LAMBDASER 51.0 (H) 08/17/2022   KAPLAMBRATIO 0.55 08/17/2022     Today's MGUS labs are pending.  Recommend observation, repeat labs in 6 months. Check skeletal survey

## 2023-03-27 LAB — KAPPA/LAMBDA LIGHT CHAINS
Kappa free light chain: 20.4 mg/L — ABNORMAL HIGH (ref 3.3–19.4)
Kappa, lambda light chain ratio: 0.66 (ref 0.26–1.65)
Lambda free light chains: 31 mg/L — ABNORMAL HIGH (ref 5.7–26.3)

## 2023-03-29 LAB — MULTIPLE MYELOMA PANEL, SERUM
Albumin SerPl Elph-Mcnc: 3.6 g/dL (ref 2.9–4.4)
Albumin/Glob SerPl: 1.6 (ref 0.7–1.7)
Alpha 1: 0.2 g/dL (ref 0.0–0.4)
Alpha2 Glob SerPl Elph-Mcnc: 0.6 g/dL (ref 0.4–1.0)
B-Globulin SerPl Elph-Mcnc: 0.7 g/dL (ref 0.7–1.3)
Gamma Glob SerPl Elph-Mcnc: 0.9 g/dL (ref 0.4–1.8)
Globulin, Total: 2.4 g/dL (ref 2.2–3.9)
IgA: 124 mg/dL (ref 61–437)
IgG (Immunoglobin G), Serum: 987 mg/dL (ref 603–1613)
IgM (Immunoglobulin M), Srm: 73 mg/dL (ref 15–143)
M Protein SerPl Elph-Mcnc: 0.5 g/dL — ABNORMAL HIGH
Total Protein ELP: 6 g/dL (ref 6.0–8.5)

## 2023-04-01 ENCOUNTER — Telehealth: Payer: Self-pay | Admitting: Family

## 2023-04-01 NOTE — Telephone Encounter (Signed)
 Pt confirmed appt for 04/02/23

## 2023-04-01 NOTE — Progress Notes (Unsigned)
 [facility] Note   Referring Physician: PCP: Lauro Regulus, MD Cardiologist: None   Chief Complaint:   HPI:  Todd Whitehead is a 78 y/o male with a history of HTN, anxiety, COPD, CAD (MI), PVD, current tobacco use and chronic heart failure.   Echo report from 01/27/21 Left ventricular ejection fraction, by estimation, is 45 to 50%. The left ventricle has mildly decreased function. The left ventricle demonstrates global hypokinesis. There is mild left ventricular hypertrophy of the basal-septal segment. Left ventricular diastolic parameters are consistent with Grade I diastolic dysfunction  LHC done 08/19/15 and showed: Mid LAD lesion, 80% stenosed. Mid Cx lesion, 85% stenosed. Mid RCA lesion, 100% stenosed. LM lesion, 50% stenosed. 3 vessel cad. Mildly reduced lv funciton     Review of Systems: [y] = yes, [ ]  = no   General: Weight gain [ ] ; Weight loss [ ] ; Anorexia [ ] ; Fatigue [ ] ; Fever [ ] ; Chills [ ] ; Weakness [ ]   Cardiac: Chest pain/pressure [ ] ; Resting SOB [ ] ; Exertional SOB [ ] ; Orthopnea [ ] ; Pedal Edema [ ] ; Palpitations [ ] ; Syncope [ ] ; Presyncope [ ] ; Paroxysmal nocturnal dyspnea[ ]   Pulmonary: Cough [ ] ; Wheezing[ ] ; Hemoptysis[ ] ; Sputum [ ] ; Snoring [ ]   GI: Vomiting[ ] ; Dysphagia[ ] ; Melena[ ] ; Hematochezia [ ] ; Heartburn[ ] ; Abdominal pain [ ] ; Constipation [ ] ; Diarrhea [ ] ; BRBPR [ ]   GU: Hematuria[ ] ; Dysuria [ ] ; Nocturia[ ]   Vascular: Pain in legs with walking [ ] ; Pain in feet with lying flat [ ] ; Non-healing sores [ ] ; Stroke [ ] ; TIA [ ] ; Slurred speech [ ] ;  Neuro: Headaches[ ] ; Vertigo[ ] ; Seizures[ ] ; Paresthesias[ ] ;Blurred vision [ ] ; Diplopia [ ] ; Vision changes [ ]   Ortho/Skin: Arthritis [ ] ; Joint pain [ ] ; Muscle pain [ ] ; Joint swelling [ ] ; Back Pain [ ] ; Rash [ ]   Psych: Depression[ ] ; Anxiety[ ]   Heme: Bleeding problems [ ] ; Clotting disorders [ ] ; Anemia [ ]   Endocrine: Diabetes [ ] ; Thyroid dysfunction[ ]    Past  Medical History:  Diagnosis Date   Anxiety    Arthritis    CHF (congestive heart failure) (HCC)    Colon polyp    COPD (chronic obstructive pulmonary disease) (HCC)    smoker   Dysrhythmia    Glaucoma    Heart murmur    Hypertension    on  no meds   Low serum vitamin B12 11/18/2020   Melanoma (HCC)    Resected from Left upper arm.    Myocardial infarction [facility]) 2017   Peripheral vascular disease (HCC)    Prostate enlargement     Current Outpatient Medications  Medication Sig Dispense Refill   acetaminophen (TYLENOL) 500 MG tablet Take 1 tablet (500 mg total) by mouth every 6 (six) hours as needed. 30 tablet 0   albuterol (PROVENTIL) (2.5 MG/3ML) 0.083% nebulizer solution Take 3 mLs (2.5 mg total) by nebulization every 6 (six) hours as needed for wheezing or shortness of breath. 75 mL 12   ALPRAZolam (XANAX) 0.25 MG tablet Take by mouth at bedtime as needed for sleep.     aspirin EC 81 MG tablet Take 81 mg by mouth daily. Swallow whole.     atorvastatin (LIPITOR) 40 MG tablet Take 40 mg by mouth daily.      brimonidine (ALPHAGAN) 0.15 % ophthalmic solution Place 1 drop into both eyes 2 (two) times daily.     busPIRone (  BUSPAR) 30 MG tablet Take 30 mg by mouth 2 (two) times daily.     citalopram (CELEXA) 20 MG tablet Take 20 mg by mouth daily.     clopidogrel (PLAVIX) 75 MG tablet TAKE 1 TABLET(75 MG) BY MOUTH DAILY 30 tablet 5   dapagliflozin propanediol (FARXIGA) 10 MG TABS tablet Take 1 tablet (10 mg total) by mouth daily before breakfast. 90 tablet 3   Dextromethorphan-guaiFENesin 20-200 MG/20ML LIQD Take 10 mLs by mouth every 4 (four) hours as needed. 118 mL 0   ferrous sulfate 325 (65 FE) MG EC tablet TAKE 1 TABLET(325 MG) BY MOUTH TWICE DAILY WITH A MEAL 180 tablet 1   guaiFENesin (MUCINEX) 600 MG 12 hr tablet Take 1 tablet (600 mg total) by mouth 2 (two) times daily for 5 days. 40 tablet 0   latanoprost (XALATAN) 0.005 % ophthalmic solution Place 1 drop into both eyes at  bedtime.   2   metoprolol succinate (TOPROL-XL) 25 MG 24 hr tablet Take 12.5 mg by mouth daily.     Multiple Vitamin (MULTIVITAMIN WITH MINERALS) TABS tablet Take 1 tablet by mouth daily. 30 tablet 0   sacubitril-valsartan (ENTRESTO) 49-51 MG Take 1 tablet by mouth 2 (two) times daily. 180 tablet 3   tamsulosin (FLOMAX) 0.4 MG CAPS capsule Take 1 capsule (0.4 mg total) by mouth daily after breakfast. 30 capsule 2   traZODone (DESYREL) 50 MG tablet Take 50 mg by mouth at bedtime.     No current facility-administered medications for this visit.    Allergies  Allergen Reactions   Other Nausea And Vomiting    Cheese,butter,sour cream,cottage cheese   Poison Ivy Extract Rash   Poison Oak Extract Rash      Social History   Socioeconomic History   Marital status: Divorced    Spouse name: Not on file   Number of children: Not on file   Years of education: Not on file   Highest education level: Not on file  Occupational History   Not on file  Tobacco Use   Smoking status: Every Day    Current packs/day: 1.00    Average packs/day: 1 pack/day for 58.0 years (58.0 ttl pk-yrs)    Types: Cigarettes   Smokeless tobacco: Never  Vaping Use   Vaping status: Former  Substance and Sexual Activity   Alcohol use: No   Drug use: No   Sexual activity: Not Currently  Other Topics Concern   Not on file  Social History Narrative   Not on file   Social Drivers of Health   Financial Resource Strain: Not on file  Food Insecurity: No Food Insecurity (03/16/2023)   Hunger Vital Sign    Worried About Running Out of Food in the Last Year: Never true    Ran Out of Food in the Last Year: Never true  Transportation Needs: No Transportation Needs (03/16/2023)   PRAPARE - Administrator, Civil Service (Medical): No    Lack of Transportation (Non-Medical): No  Physical Activity: Not on file  Stress: Not on file  Social Connections: Moderately Integrated (03/16/2023)   Social Connection  and Isolation Panel [NHANES]    Frequency of Communication with Friends and Family: Three times a week    Frequency of Social Gatherings with Friends and Family: Once a week    Attends Religious Services: More than 4 times per year    Active Member of [employer] or Organizations: Yes    Attends Banker Meetings:  Never    Marital Status: Divorced  Catering manager Violence: Not At Risk (03/16/2023)   Humiliation, Afraid, Rape, and Kick questionnaire    Fear of Current or Ex-Partner: No    Emotionally Abused: No    Physically Abused: No    Sexually Abused: No      Family History  Problem Relation Age of Onset   Heart attack Father    Aneurysm Brother    Lung cancer Brother     There were no vitals filed for this visit.   PHYSICAL EXAM: General:  Well appearing. No respiratory difficulty HEENT: normal Neck: supple. no JVD. Carotids 2+ bilat; no bruits. No lymphadenopathy or thyromegaly appreciated. Cor: PMI nondisplaced. Regular rate & rhythm. No rubs, gallops or murmurs. Lungs: clear Abdomen: soft, nontender, nondistended. No hepatosplenomegaly. No bruits or masses. Good bowel sounds. Extremities: no cyanosis, clubbing, rash, edema Neuro: alert & oriented x 3, cranial nerves grossly intact. moves all 4 extremities w/o difficulty. Affect pleasant.  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 Juliann Pares - on GDMT of metoprolol, entresto & farxiga - getting entresto and farxiga through patient assistance -  BNP 12/09/20 was 1527.4  2: HTN- - BP elevated (153/64) - saw PCP Dareen Piano) 08/17/21 - BMP 08/16/21 reviewed and showed sodium 129, potassium 4.3, creatinine 1.31 & GFR 56  3: severe COPD- - has weaned himself off of oxygen although he does wear it at bedtime - saw pulmonology Meredeth Ide) 05/03/21  4: Tobacco use- - smoking 1.0 ppd of cigarettes; at his peak he was smoking 3 ppd - emphasized to remove himself from the oxygen tank when smoking - has been smoking since the age of 21  5: Anemia - saw hematology Cathie Hoops) 05/17/21 - hemoglobin 08/16/21 was 10.7  6: Depression with anxiety- - he does endorse worsening depression - doesn't feel like his current medication regimen is working - emphasized that he call his PCP to discuss this further - emotional support gi.my?   Patient did not bring his medications nor a list. Each medication was verbally reviewed with the patient and he was encouraged to bring the bottles to every visit to confirm accuracy of list.    Delma Freeze, FNP 04/01/23

## 2023-04-02 ENCOUNTER — Telehealth (HOSPITAL_COMMUNITY): Payer: Self-pay

## 2023-04-02 ENCOUNTER — Encounter: Payer: Self-pay | Admitting: Family

## 2023-04-02 ENCOUNTER — Other Ambulatory Visit (HOSPITAL_COMMUNITY): Payer: Self-pay

## 2023-04-02 ENCOUNTER — Ambulatory Visit: Payer: Medicare Other | Attending: Family | Admitting: Family

## 2023-04-02 VITALS — BP 135/54 | HR 75 | Wt 115.0 lb

## 2023-04-02 DIAGNOSIS — I5022 Chronic systolic (congestive) heart failure: Secondary | ICD-10-CM

## 2023-04-02 DIAGNOSIS — Z79899 Other long term (current) drug therapy: Secondary | ICD-10-CM | POA: Diagnosis not present

## 2023-04-02 DIAGNOSIS — I130 Hypertensive heart and chronic kidney disease with heart failure and stage 1 through stage 4 chronic kidney disease, or unspecified chronic kidney disease: Secondary | ICD-10-CM | POA: Insufficient documentation

## 2023-04-02 DIAGNOSIS — Z7984 Long term (current) use of oral hypoglycemic drugs: Secondary | ICD-10-CM | POA: Insufficient documentation

## 2023-04-02 DIAGNOSIS — N189 Chronic kidney disease, unspecified: Secondary | ICD-10-CM | POA: Insufficient documentation

## 2023-04-02 DIAGNOSIS — J449 Chronic obstructive pulmonary disease, unspecified: Secondary | ICD-10-CM

## 2023-04-02 DIAGNOSIS — I2510 Atherosclerotic heart disease of native coronary artery without angina pectoris: Secondary | ICD-10-CM | POA: Diagnosis not present

## 2023-04-02 DIAGNOSIS — Z720 Tobacco use: Secondary | ICD-10-CM

## 2023-04-02 DIAGNOSIS — Z951 Presence of aortocoronary bypass graft: Secondary | ICD-10-CM | POA: Diagnosis not present

## 2023-04-02 DIAGNOSIS — F17210 Nicotine dependence, cigarettes, uncomplicated: Secondary | ICD-10-CM | POA: Diagnosis not present

## 2023-04-02 DIAGNOSIS — E785 Hyperlipidemia, unspecified: Secondary | ICD-10-CM | POA: Diagnosis not present

## 2023-04-02 DIAGNOSIS — E1122 Type 2 diabetes mellitus with diabetic chronic kidney disease: Secondary | ICD-10-CM | POA: Insufficient documentation

## 2023-04-02 DIAGNOSIS — E1151 Type 2 diabetes mellitus with diabetic peripheral angiopathy without gangrene: Secondary | ICD-10-CM | POA: Diagnosis not present

## 2023-04-02 DIAGNOSIS — I10 Essential (primary) hypertension: Secondary | ICD-10-CM

## 2023-04-02 DIAGNOSIS — Z8249 Family history of ischemic heart disease and other diseases of the circulatory system: Secondary | ICD-10-CM | POA: Diagnosis not present

## 2023-04-02 DIAGNOSIS — I252 Old myocardial infarction: Secondary | ICD-10-CM | POA: Insufficient documentation

## 2023-04-02 DIAGNOSIS — F419 Anxiety disorder, unspecified: Secondary | ICD-10-CM | POA: Diagnosis not present

## 2023-04-02 DIAGNOSIS — Z7902 Long term (current) use of antithrombotics/antiplatelets: Secondary | ICD-10-CM | POA: Insufficient documentation

## 2023-04-02 DIAGNOSIS — D649 Anemia, unspecified: Secondary | ICD-10-CM | POA: Diagnosis not present

## 2023-04-02 DIAGNOSIS — F32A Depression, unspecified: Secondary | ICD-10-CM | POA: Diagnosis not present

## 2023-04-02 DIAGNOSIS — D472 Monoclonal gammopathy: Secondary | ICD-10-CM | POA: Insufficient documentation

## 2023-04-02 NOTE — Patient Instructions (Signed)
 Medication Changes:  No medication changes   Testing/Procedures:  Please have your echo completed on Wednesday, April 2nd at 11:00 AM. You will check in for this at the [facility]. You have to arrive 15 MINS EARLY for preparation, otherwise you will have to reschedule.   Follow-Up in: Wednesday, April 9th at 10:30 AM with Clarisa Kindred, FNP.  At the [facility] Clinic, you and your health needs are our priority. We have a designated team specialized in the treatment of Heart Failure. This Care Team includes your primary Heart Failure Specialized Cardiologist (physician), Advanced Practice Providers (APPs- Physician Assistants and Nurse Practitioners), and Pharmacist who all work together to provide you with the care you need, when you need it.   You may see any of the following providers on your designated Care Team at your next follow up:  Dr. Arvilla Meres Dr. Marca Ancona Dr. Dorthula Nettles Dr. Theresia Bough Clarisa Kindred, FNP Enos Fling, RPH-CPP  Please be sure to bring in all your medications bottles to every appointment.   Need to Contact us:  If you have any questions or concerns before your next appointment please send [place] a message through [place] or call our office at [phone].    TO LEAVE A MESSAGE FOR THE NURSE SELECT OPTION 2, PLEASE LEAVE A MESSAGE INCLUDING: YOUR NAME DATE OF BIRTH CALL BACK NUMBER REASON FOR CALL**this is important as we prioritize the call backs  YOU WILL RECEIVE A CALL BACK THE SAME DAY AS LONG AS YOU CALL BEFORE 4:00 PM

## 2023-04-02 NOTE — Telephone Encounter (Signed)
 Pharmacy Patient Advocate Encounter  Insurance verification completed.    The patient is insured through [facility]. Patient has [employer], may use a copay card, and/or apply for patient assistance if available.    Ran test claim for Entresto 24-26 mg and the current 30 day co-pay is $12.15.   This test claim was processed through [facility]- copay amounts may vary at other pharmacies due to pharmacy/plan contracts, or as the patient moves through the different stages of their insurance plan.

## 2023-04-17 ENCOUNTER — Telehealth: Payer: Self-pay | Admitting: Pharmacist

## 2023-04-17 MED ORDER — SACUBITRIL-VALSARTAN 49-51 MG PO TABS: 1.0000 | ORAL_TABLET | Freq: Two times a day (BID) | ORAL | 3 refills | Status: DC

## 2023-04-17 NOTE — Telephone Encounter (Signed)
 Patient presented to clinic today with questions regarding Medicaid denial. Medicaid application is sent when applying for Extra Help. The patient is approved for extra help, but denied for Medicaid. This was explained to the patient. Branded medications are now covered on Medicare for $11 with Extra Help. New prescription sent.

## 2023-04-29 ENCOUNTER — Telehealth: Payer: Self-pay

## 2023-04-29 NOTE — Progress Notes (Signed)
 Pt called stating he cannot get his Entresto anymore due to Medicaid no longer paying for it. Pt states he has 2 days left of medication. Notified pt I would reach out to our pharmacist to see if we could help. Pt also aware he can come by the office to get samples.

## 2023-04-29 NOTE — Progress Notes (Signed)
 Pt called stating he could not afford his Entresto and Medicare refuses to help. Let pt know I'd reach out to the pharmacist to see if we can help in any way. Also notified pt that he could come into the office for samples. Pt states he only has 2 days worth of medication left and would come to get samples. Could you please assist?

## 2023-05-01 ENCOUNTER — Ambulatory Visit: Admission: RE | Admit: 2023-05-01 | Source: Ambulatory Visit

## 2023-05-07 ENCOUNTER — Telehealth: Payer: Self-pay | Admitting: Family

## 2023-05-07 NOTE — Telephone Encounter (Incomplete)
 Called to confirm/remind patient of their appointment at the [facility] on 05/08/23.   Appointment:   [x] Confirmed  [] Left mess   [] No answer/No voice mail  [] Phone not in service  Patient reminded to bring all medications and/or complete list.  Confirmed patient has transportation. Gave directions, instructed to utilize valet parking.

## 2023-05-07 NOTE — Progress Notes (Unsigned)
 Advanced [facility] Failure Clinic Note    PCP: Lauro Regulus, MD (last seen 04/25) Cardiologist: Dorothyann Peng, MD (last seen 12/24)  Chief Complaint: shortness of breath  HPI:  Todd Whitehead is a 78 y/o male with a history of HTN, anxiety, depression, COPD, hyperlipidemia, CAD (MI), CABG, anemia, MGUS, CKD, PVD, DM, carotid artery stenosis, current tobacco use and chronic heart failure. Has not been seen in the HF clinic since 09/23.   Admitted 03/15/23 with dyspnea, wheezing, and hemoptysis. CTA negative for PE. Pulmonology consulted. Bronchoscopy with lavage and mucus removal. Hemoptysis thought to be due to pneumonia. Given antibiotics, steroids, oxygen with improvement of symptoms. Palliative consult obtained  Echo 2/30/22: EF 45 to 50%, global hypokinesis, mild left ventricular hypertrophy, G1DD  LHC done 08/19/15 and showed: Mid LAD lesion, 80% stenosed. Mid Cx lesion, 85% stenosed. Mid RCA lesion, 100% stenosed. LM lesion, 50% stenosed. 3 vessel cad. Mildly reduced lv funciton  He presents today with a chief complaint of shortness of breath. Has associated fatigue along with this. Denies chest pain, palpitations, abdominal distention, pedal edema, dizziness, weight gain or difficulty sleeping.   At last visit, entresto was resumed although he says that he's been out of it for ~ 1 week. Says that medicaid won't pay for it anymore. He missed his echo appt last week.   ROS: All systems negative except what is listed in HPI, PMH and Problem List   Past Medical History:  Diagnosis Date   Anxiety    Arthritis    CHF (congestive heart failure) (HCC)    Colon polyp    COPD (chronic obstructive pulmonary disease) (HCC)    smoker   Dysrhythmia    Glaucoma    Heart murmur    Hypertension    on  no meds   Low serum vitamin B12 11/18/2020   Melanoma (HCC)    Resected from Left upper arm.    Myocardial infarction [facility]) 2017   Peripheral vascular disease (HCC)    Prostate  enlargement     Current Outpatient Medications  Medication Sig Dispense Refill   acetaminophen (TYLENOL) 500 MG tablet Take 1 tablet (500 mg total) by mouth every 6 (six) hours as needed. 30 tablet 0   albuterol (PROVENTIL) (2.5 MG/3ML) 0.083% nebulizer solution Take 3 mLs (2.5 mg total) by nebulization every 6 (six) hours as needed for wheezing or shortness of breath. (Patient not taking: Reported on 04/02/2023) 75 mL 12   ALPRAZolam (XANAX) 0.25 MG tablet Take by mouth at bedtime as needed for sleep.     aspirin EC 81 MG tablet Take 81 mg by mouth daily. Swallow whole.     atorvastatin (LIPITOR) 40 MG tablet Take 40 mg by mouth daily.      brimonidine (ALPHAGAN) 0.15 % ophthalmic solution Place 1 drop into both eyes 2 (two) times daily.     busPIRone (BUSPAR) 30 MG tablet Take 30 mg by mouth 2 (two) times daily.     citalopram (CELEXA) 20 MG tablet Take 20 mg by mouth daily.     clopidogrel (PLAVIX) 75 MG tablet TAKE 1 TABLET(75 MG) BY MOUTH DAILY 30 tablet 5   dapagliflozin propanediol (FARXIGA) 10 MG TABS tablet Take 1 tablet (10 mg total) by mouth daily before breakfast. 90 tablet 3   Dextromethorphan-guaiFENesin 20-200 MG/20ML LIQD Take 10 mLs by mouth every 4 (four) hours as needed. (Patient not taking: Reported on 04/02/2023) 118 mL 0   ferrous sulfate 325 (65 FE) MG EC  tablet TAKE 1 TABLET(325 MG) BY MOUTH TWICE DAILY WITH A MEAL 180 tablet 1   latanoprost (XALATAN) 0.005 % ophthalmic solution Place 1 drop into both eyes at bedtime.   2   metoprolol succinate (TOPROL-XL) 25 MG 24 hr tablet Take 12.5 mg by mouth daily.     Multiple Vitamin (MULTIVITAMIN WITH MINERALS) TABS tablet Take 1 tablet by mouth daily. 30 tablet 0   sacubitril-valsartan (ENTRESTO) 49-51 MG Take 1 tablet by mouth 2 (two) times daily. 180 tablet 3   tamsulosin (FLOMAX) 0.4 MG CAPS capsule Take 1 capsule (0.4 mg total) by mouth daily after breakfast. 30 capsule 2   traZODone (DESYREL) 50 MG tablet Take 50 mg by mouth at  bedtime.     No current facility-administered medications for this visit.    Allergies  Allergen Reactions   Other Nausea And Vomiting    Cheese,butter,sour cream,cottage cheese   Poison Ivy Extract Rash   Poison Oak Extract Rash      Social History   Socioeconomic History   Marital status: Divorced    Spouse name: Not on file   Number of children: Not on file   Years of education: Not on file   Highest education level: Not on file  Occupational History   Not on file  Tobacco Use   Smoking status: Every Day    Current packs/day: 1.00    Average packs/day: 1 pack/day for 58.0 years (58.0 ttl pk-yrs)    Types: Cigarettes   Smokeless tobacco: Never  Vaping Use   Vaping status: Former  Substance and Sexual Activity   Alcohol use: No   Drug use: No   Sexual activity: Not Currently  Other Topics Concern   Not on file  Social History Narrative   Not on file   Social Drivers of Health   Financial Resource Strain: Low Risk  (04/05/2023)   Received [facility]   Overall Financial Resource Strain (CARDIA)    Difficulty of Paying Living Expenses: Not very hard  Food Insecurity: No Food Insecurity (04/05/2023)   Received from [facility]   Hunger Vital Sign    Worried About Running Out of Food in the Last Year: Never true    Ran Out of Food in the Last Year: Never true  Transportation Needs: No Transportation Needs (04/05/2023)   Received from [facility] - Transportation    In the past 12 months, has lack of transportation kept you from medical appointments or from getting medications?: No    Lack of Transportation (Non-Medical): No  Physical Activity: Not on file  Stress: Not on file  Social Connections: Moderately Integrated (03/16/2023)   Social Connection and Isolation Panel [NHANES]    Frequency of Communication with Friends and Family: Three times a week    Frequency of Social Gatherings with Friends  and Family: Once a week    Attends Religious Services: More than 4 times per year    Active Member of [employer] or Organizations: Yes    Attends Banker Meetings: Never    Marital Status: Divorced  Catering manager Violence: Not At Risk (03/16/2023)   Humiliation, Afraid, Rape, and Kick questionnaire    Fear of Current or Ex-Partner: No    Emotionally Abused: No    Physically Abused: No    Sexually Abused: No      Family History  Problem Relation Age of Onset   Heart attack Father  Aneurysm Brother    Lung cancer Brother    Vitals:   05/08/23 1034  BP: (!) 148/76  Pulse: 76  SpO2: 97%  Weight: 114 lb 6 oz (51.9 kg)   Wt Readings from Last 3 Encounters:  05/08/23 114 lb 6 oz (51.9 kg)  04/02/23 115 lb (52.2 kg)  03/18/23 114 lb (51.7 kg)   Lab Results  Component Value Date   CREATININE 0.96 03/19/2023   CREATININE 0.96 03/18/2023   CREATININE 1.25 (H) 03/15/2023    PHYSICAL EXAM:  General: Well appearing. No resp difficulty HEENT: normal Neck: supple, no JVD Cor: Regular rhythm, rate. No rubs, gallops or murmurs Lungs: clear Abdomen: soft, nontender, nondistended. Extremities: no cyanosis, clubbing, rash, edema Neuro: alert & oriented X 3. Moves all 4 extremities w/o difficulty. Affect pleasant   ECG: not done  ReDs reading: 30 %, normal    ASSESSMENT & PLAN:  1: Chronic heart failure with mildly reduced ejection fraction- - suspect due to severe COPD - NYHA class III - euvolemic today - weighing daily; reminded to call for an overnight weight gain of > 2 pounds or a weekly weight gain of > 5 pounds - weight unchanged from last visit here 1 month ago - Reds 30% - Echo 2/30/22: EF 45 to 50%, global hypokinesis, mild left ventricular hypertrophy, G1DD - NS for echo last week, will get this r/s - continue farxiga 10mg  daily - continue metoprolol succinate 12.5mg  daily - resume entresto 49/51mg  BID - plan to add MRA next visit - BNP 08/03/21  reviewed and was 152.5  2: HTN- - BP 148/76 (resuming entresto per above) - saw PCP Dareen Piano) 04/25 - BMP 03/19/23 reviewed and showed sodium 137, potassium 3.9, creatinine 0.96 & GFR >60  3: severe COPD- - has weaned himself off of daily oxygen although he does have it for PRN usage - saw pulmonology Meredeth Ide) 03/25  4: Tobacco use- - smoking 1.0 ppd of cigarettes; at his peak he was smoking 3 ppd - has been smoking since the age of 62 - cessation discussed  5: Anemia/ MGUS- - saw hematology Cathie Hoops) 02/25 - hemoglobin 03/26/23 reviewed and was 9.9  6: CAD- - saw cardiology Juliann Pares) 12/24 - continue atorvastatin 40mg  daily - continue plavix 75mg  daily - LDL 09/26/22 reviewed and was 25 - LHC done 08/19/15 and showed: Mid LAD lesion, 80% stenosed. Mid Cx lesion, 85% stenosed. Mid RCA lesion, 100% stenosed. LM lesion, 50% stenosed.    3 vessel cad. Mildly reduced lv function   Return in 1 month, sooner if needed.   Delma Freeze, FNP 05/07/23

## 2023-05-08 ENCOUNTER — Encounter: Payer: Self-pay | Admitting: Family

## 2023-05-08 ENCOUNTER — Ambulatory Visit: Attending: Family | Admitting: Family

## 2023-05-08 VITALS — BP 148/76 | HR 76 | Wt 114.4 lb

## 2023-05-08 DIAGNOSIS — D649 Anemia, unspecified: Secondary | ICD-10-CM

## 2023-05-08 DIAGNOSIS — E785 Hyperlipidemia, unspecified: Secondary | ICD-10-CM | POA: Insufficient documentation

## 2023-05-08 DIAGNOSIS — E1151 Type 2 diabetes mellitus with diabetic peripheral angiopathy without gangrene: Secondary | ICD-10-CM | POA: Diagnosis not present

## 2023-05-08 DIAGNOSIS — Z720 Tobacco use: Secondary | ICD-10-CM

## 2023-05-08 DIAGNOSIS — I10 Essential (primary) hypertension: Secondary | ICD-10-CM | POA: Diagnosis not present

## 2023-05-08 DIAGNOSIS — I130 Hypertensive heart and chronic kidney disease with heart failure and stage 1 through stage 4 chronic kidney disease, or unspecified chronic kidney disease: Secondary | ICD-10-CM | POA: Insufficient documentation

## 2023-05-08 DIAGNOSIS — I2510 Atherosclerotic heart disease of native coronary artery without angina pectoris: Secondary | ICD-10-CM

## 2023-05-08 DIAGNOSIS — Z7984 Long term (current) use of oral hypoglycemic drugs: Secondary | ICD-10-CM | POA: Diagnosis not present

## 2023-05-08 DIAGNOSIS — F32A Depression, unspecified: Secondary | ICD-10-CM | POA: Insufficient documentation

## 2023-05-08 DIAGNOSIS — F17210 Nicotine dependence, cigarettes, uncomplicated: Secondary | ICD-10-CM | POA: Diagnosis not present

## 2023-05-08 DIAGNOSIS — D472 Monoclonal gammopathy: Secondary | ICD-10-CM | POA: Diagnosis not present

## 2023-05-08 DIAGNOSIS — I5022 Chronic systolic (congestive) heart failure: Secondary | ICD-10-CM | POA: Diagnosis present

## 2023-05-08 DIAGNOSIS — J449 Chronic obstructive pulmonary disease, unspecified: Secondary | ICD-10-CM

## 2023-05-08 DIAGNOSIS — Z7902 Long term (current) use of antithrombotics/antiplatelets: Secondary | ICD-10-CM | POA: Diagnosis not present

## 2023-05-08 DIAGNOSIS — I252 Old myocardial infarction: Secondary | ICD-10-CM | POA: Insufficient documentation

## 2023-05-08 DIAGNOSIS — F419 Anxiety disorder, unspecified: Secondary | ICD-10-CM | POA: Diagnosis not present

## 2023-05-08 DIAGNOSIS — N189 Chronic kidney disease, unspecified: Secondary | ICD-10-CM | POA: Insufficient documentation

## 2023-05-08 DIAGNOSIS — Z951 Presence of aortocoronary bypass graft: Secondary | ICD-10-CM | POA: Diagnosis not present

## 2023-05-08 DIAGNOSIS — E1122 Type 2 diabetes mellitus with diabetic chronic kidney disease: Secondary | ICD-10-CM | POA: Insufficient documentation

## 2023-05-08 DIAGNOSIS — Z79899 Other long term (current) drug therapy: Secondary | ICD-10-CM | POA: Insufficient documentation

## 2023-05-08 MED ORDER — SACUBITRIL-VALSARTAN 49-51 MG PO TABS: 1.0000 | ORAL_TABLET | Freq: Two times a day (BID) | ORAL | 3 refills | Status: DC

## 2023-05-08 NOTE — Patient Instructions (Signed)
 Testing/Procedures:  Please have your echo completed on Monday, May 12th at 10:00AM. You will check in for this at the [facility]. You have to arrive 15 MINS EARLY for preparation, otherwise you will have to reschedule.   Special Instructions // Education:  Please complete your echo and then come to your appointment afterwards.   Follow-Up on: Monday, May 12th at 11:00 AM.  At the [facility], you and your health needs are our priority. We have a designated team specialized in the treatment of Heart Failure. This Care Team includes your primary Heart Failure Specialized Cardiologist (physician), Advanced Practice Providers (APPs- Physician Assistants and Nurse Practitioners), and Pharmacist who all work together to provide you with the care you need, when you need it.   You may see any of the following providers on your designated Care Team at your next follow up:  Dr. Arvilla Meres Dr. Marca Ancona Dr. Dorthula Nettles Dr. Theresia Bough Clarisa Kindred, FNP Enos Fling, RPH-CPP  Please be sure to bring in all your medications bottles to every appointment.   Need to Contact us:  If you have any questions or concerns before your next appointment please send [place] a message through [place] or call our office at [phone].    TO LEAVE A MESSAGE FOR THE NURSE SELECT OPTION 2, PLEASE LEAVE A MESSAGE INCLUDING: YOUR NAME DATE OF BIRTH CALL BACK NUMBER REASON FOR CALL**this is important as we prioritize the call backs  YOU WILL RECEIVE A CALL BACK THE SAME DAY AS LONG AS YOU CALL BEFORE 4:00 PM

## 2023-05-08 NOTE — Progress Notes (Signed)
 ReDS Vest / Clip - 05/08/23 1038       ReDS Vest / Clip   Station Marker C    Ruler Value 21    ReDS Value Range Low volume    ReDS Actual Value 30

## 2023-05-08 NOTE — Progress Notes (Signed)
 [facility] - HEART FAILURE CLINIC - PHARMACIST COUNSELING NOTE  Adherence Assessment  Do you ever forget to take your medication? [] Yes [x] No  Do you ever skip doses due to side effects? [] Yes [x] No  Do you have trouble affording your medicines? [] Yes [x] No  Are you ever unable to pick up your medication due to transportation difficulties? [] Yes [x] No  Do you ever stop taking your medications because you don't believe they are helping? [] Yes [x] No  Do you check your weight daily? [] Yes [x] No  Do you check your blood pressure daily? [] Yes [x] No  Adherence strategy: Pill box   Barriers to obtaining medications: None reported    Vital signs: HR 76, BP 148/76, weight 114 lbs.  ECHO: Date 2/30/22, EF 45-50% Renal function: Date 03/19/23, GFR > 60  Current Guideline-Directed Medical Therapy/Evidence Based Medicine  ACE/ARB/ARNI: Sacubitril-valsartan 49-51 mg twice daily Target dose: 97/103 mg twice daily   Beta Blocker: Metoprolol succinate 12.5 mg daily Target dose: 200 mg daily  Aldosterone Antagonist:  N/A Target dose: N/A  SGLT2i: Dapagliflozin 10 mg daily Target dose: On target dose  Diuretic:  N/A  ASSESSMENT 78 year old male who presents to the HF clinic for a follow-up appointment. PMH is significant for HTN, anxiety, depression, COPD, hyperlipidemia, CAD (MI), CABG, anemia, MGUS, CKD, PVD, DM, carotid artery stenosis, current tobacco use and chronic heart failure. States that he has been out of [place] for about 1 week.   Recent ED visit or hospitalization (past 6 months):  Date - 03/15/23 - 03/19/23, [facility] - hemoptysis, Admission Dx - pneumonia, COPD exacerbation  PLAN Continue current regimen as directed by NP Consider addition of spironolactone at next visit  Annual ECHO rescheduled 06/10/2023  Time spent: 20 minutes  Littie Deeds, PharmD Pharmacy Resident  05/08/2023 8:50 AM

## 2023-05-17 ENCOUNTER — Other Ambulatory Visit (HOSPITAL_COMMUNITY): Payer: Self-pay

## 2023-06-07 ENCOUNTER — Telehealth: Payer: Self-pay | Admitting: Family

## 2023-06-07 NOTE — Telephone Encounter (Signed)
 Called to confirm/remind patient of their appointment at the [facility] on 06/10/23.   Appointment:   [x] Confirmed  [] Left mess   [] No answer/No voice mail  [] VM Full/unable to leave message  [] Phone not in service  Patient reminded to bring all medications and/or complete list.  Confirmed patient has transportation. Gave directions, instructed to utilize valet parking.

## 2023-06-07 NOTE — Progress Notes (Deleted)
 [facility] Note    PCP: Jimmy Moulding, MD (last seen 04/25) Cardiologist: Burney Carter, MD (last seen 12/24)  Chief Complaint:   HPI:  Mr Mckinnon is a 78 y/o male with a history of HTN, anxiety, depression, COPD, hyperlipidemia, CAD (MI), CABG, anemia, MGUS, CKD, PVD, DM, carotid artery stenosis, current tobacco use and chronic heart failure.   LHC done 08/19/15 and showed: Mid LAD lesion, 80% stenosed. Mid Cx lesion, 85% stenosed. Mid RCA lesion, 100% stenosed. LM lesion, 50% stenosed. 3 vessel cad. Mildly reduced lv function  Echo 2/30/22: EF 45 to 50%, global hypokinesis, mild left ventricular hypertrophy, G1DD  Admitted 03/15/23 with dyspnea, wheezing, and hemoptysis. CTA negative for PE. Pulmonology consulted. Bronchoscopy with lavage and mucus removal. Hemoptysis thought to be due to pneumonia. Given antibiotics, steroids, oxygen with improvement of symptoms. Palliative consult obtained  Seen in HF clinic 04/25 and entresto  49/51mg  was resumed.   He presents today for a HF follow-up visit with a chief complaint of   Had echo done earlier today.   ROS: All systems negative except what is listed in HPI, PMH and Problem List   Past Medical History:  Diagnosis Date   Anxiety    Arthritis    CHF (congestive heart failure) (HCC)    Colon polyp    COPD (chronic obstructive pulmonary disease) (HCC)    smoker   Dysrhythmia    Glaucoma    Heart murmur    Hypertension    on  no meds   Low serum vitamin B12 11/18/2020   Melanoma (HCC)    Resected from Left upper arm.    Myocardial infarction [facility]) 2017   Peripheral vascular disease (HCC)    Prostate enlargement     Current Outpatient Medications  Medication Sig Dispense Refill   acetaminophen  (TYLENOL ) 500 MG tablet Take 1 tablet (500 mg total) by mouth every 6 (six) hours as needed. 30 tablet 0   albuterol  (PROVENTIL ) (2.5 MG/3ML) 0.083% nebulizer solution Take 3 mLs (2.5 mg total) by  nebulization every 6 (six) hours as needed for wheezing or shortness of breath. 75 mL 12   ALPRAZolam  (XANAX ) 0.25 MG tablet Take by mouth at bedtime as needed for sleep.     aspirin  EC 81 MG tablet Take 81 mg by mouth daily. Swallow whole.     atorvastatin  (LIPITOR) 40 MG tablet Take 40 mg by mouth daily.      brimonidine  (ALPHAGAN ) 0.15 % ophthalmic solution Place 1 drop into both eyes 2 (two) times daily.     busPIRone  (BUSPAR ) 30 MG tablet Take 30 mg by mouth 2 (two) times daily.     citalopram  (CELEXA ) 20 MG tablet Take 20 mg by mouth daily.     clopidogrel  (PLAVIX ) 75 MG tablet TAKE 1 TABLET(75 MG) BY MOUTH DAILY 30 tablet 5   dapagliflozin  propanediol (FARXIGA ) 10 MG TABS tablet Take 1 tablet (10 mg total) by mouth daily before breakfast. 90 tablet 3   Dextromethorphan -guaiFENesin  20-200 MG/20ML LIQD Take 10 mLs by mouth every 4 (four) hours as needed. (Patient not taking: Reported on 05/08/2023) 118 mL 0   ferrous sulfate  325 (65 FE) MG EC tablet TAKE 1 TABLET(325 MG) BY MOUTH TWICE DAILY WITH A MEAL 180 tablet 1   latanoprost  (XALATAN ) 0.005 % ophthalmic solution Place 1 drop into both eyes at bedtime.   2   metoprolol  succinate (TOPROL -XL) 25 MG 24 hr tablet Take 12.5 mg by mouth daily.  Multiple Vitamin (MULTIVITAMIN WITH MINERALS) TABS tablet Take 1 tablet by mouth daily. 30 tablet 0   Multiple Vitamins-Minerals (CENTRUM ADULT 50+ MULTIGUMMIES PO) Take 1 tablet by mouth daily.     sacubitril -valsartan  (ENTRESTO ) 49-51 MG Take 1 tablet by mouth 2 (two) times daily. 180 tablet 3   tamsulosin  (FLOMAX ) 0.4 MG CAPS capsule Take 1 capsule (0.4 mg total) by mouth daily after breakfast. 30 capsule 2   traZODone  (DESYREL ) 50 MG tablet Take 50 mg by mouth at bedtime.     No current facility-administered medications for this visit.    Allergies  Allergen Reactions   Other Nausea And Vomiting    Cheese,butter,sour cream,cottage cheese   Poison Ivy Extract Rash   Poison Oak Extract Rash       Social History   Socioeconomic History   Marital status: Divorced    Spouse name: Not on file   Number of children: Not on file   Years of education: Not on file   Highest education level: Not on file  Occupational History   Not on file  Tobacco Use   Smoking status: Every Day    Current packs/day: 1.00    Average packs/day: 1 pack/day for 58.0 years (58.0 ttl pk-yrs)    Types: Cigarettes   Smokeless tobacco: Never  Vaping Use   Vaping status: Former  Substance and Sexual Activity   Alcohol use: No   Drug use: No   Sexual activity: Not Currently  Other Topics Concern   Not on file  Social History Narrative   Not on file   Social Drivers of Health   Financial Resource Strain: Low Risk  (04/05/2023)   Received from [facility]   Overall Financial Resource Strain (CARDIA)    Difficulty of Paying Living Expenses: Not very hard  Food Insecurity: No Food Insecurity (04/05/2023)   Received from [facility]   Hunger Vital Sign    Worried About Running Out of Food in the Last Year: Never true    Ran Out of Food in the Last Year: Never true  Transportation Needs: No Transportation Needs (04/05/2023)   Received from [facility] - Transportation    In the past 12 months, has lack of transportation kept you from medical appointments or from getting medications?: No    Lack of Transportation (Non-Medical): No  Physical Activity: Not on file  Stress: Not on file  Social Connections: Moderately Integrated (03/16/2023)   Social Connection and Isolation Panel [NHANES]    Frequency of Communication with Friends and Family: Three times a week    Frequency of Social Gatherings with Friends and Family: Once a week    Attends Religious Services: More than 4 times per year    Active Member of [employer] or Organizations: Yes    Attends Banker Meetings: Never    Marital Status: Divorced  Catering manager Violence:  Not At Risk (03/16/2023)   Humiliation, Afraid, Rape, and Kick questionnaire    Fear of Current or Ex-Partner: No    Emotionally Abused: No    Physically Abused: No    Sexually Abused: No      Family History  Problem Relation Age of Onset   Heart attack Father    Aneurysm Brother    Lung cancer Brother       PHYSICAL EXAM:  General: Well appearing. No resp difficulty HEENT: normal Neck: supple, no JVD Cor: Regular rhythm, rate. No rubs,  gallops or murmurs Lungs: clear Abdomen: soft, nontender, nondistended. Extremities: no cyanosis, clubbing, rash, edema Neuro: alert & oriented X 3. Moves all 4 extremities w/o difficulty. Affect pleasant   ECG: not done   ASSESSMENT & PLAN:  1: Chronic heart failure with mildly reduced ejection fraction- - suspect due to severe COPD - NYHA class III - euvolemic today - weighing daily; reminded to call for an overnight weight gain of > 2 pounds or a weekly weight gain of > 5 pounds - weight 114.6 from last visit here 1 month ago - Echo 2/30/22: EF 45 to 50%, global hypokinesis, mild left ventricular hypertrophy, G1DD - had echo done earlier today - continue farxiga  10mg  daily - continue metoprolol  succinate 12.5mg  daily - continue entresto  49/51mg  BID - plan to add MRA next visit - BNP 08/03/21 reviewed and was 152.5  2: HTN- - BP  - saw PCP Alva Jewels) 04/25 - BMP 03/19/23 reviewed: sodium 137, potassium 3.9, creatinine 0.96 & GFR >60  3: severe COPD- - has weaned himself off of daily oxygen although he does have it for PRN usage - saw pulmonology Jamal Mays) 03/25  4: Tobacco use- - smoking 1.0 ppd of cigarettes; at his peak he was smoking 3 ppd - has been smoking since the age of 85 - cessation discussed  5: Anemia/ MGUS- - saw hematology Wilhelmenia Harada) 02/25 - hemoglobin 03/26/23 reviewed and was 9.9  6: CAD- - saw cardiology Beau Bound) 12/24 - continue atorvastatin  40mg  daily - continue plavix  75mg  daily - LDL 09/26/22 reviewed  and was 25 - LHC done 08/19/15 and showed: Mid LAD lesion, 80% stenosed. Mid Cx lesion, 85% stenosed. Mid RCA lesion, 100% stenosed. LM lesion, 50% stenosed.    3 vessel cad. Mildly reduced lv function     Charlette Console, [place] 06/07/23

## 2023-06-10 ENCOUNTER — Encounter: Admitting: Family

## 2023-06-10 ENCOUNTER — Ambulatory Visit
Admission: RE | Admit: 2023-06-10 | Discharge: 2023-06-10 | Disposition: A | Discharge status: Home / Self Care | Source: Ambulatory Visit | Attending: Family | Admitting: Family

## 2023-06-10 DIAGNOSIS — I5022 Chronic systolic (congestive) heart failure: Secondary | ICD-10-CM | POA: Insufficient documentation

## 2023-06-10 DIAGNOSIS — I517 Cardiomegaly: Secondary | ICD-10-CM | POA: Diagnosis not present

## 2023-06-10 DIAGNOSIS — Q211 Atrial septal defect, unspecified: Secondary | ICD-10-CM | POA: Insufficient documentation

## 2023-06-10 DIAGNOSIS — I340 Nonrheumatic mitral (valve) insufficiency: Secondary | ICD-10-CM | POA: Insufficient documentation

## 2023-06-10 LAB — ECHOCARDIOGRAM COMPLETE: S' Lateral: 2.7 cm

## 2023-06-10 NOTE — Progress Notes (Signed)
*  PRELIMINARY RESULTS* Echocardiogram 2D Echocardiogram has been performed.  Todd Whitehead 06/10/2023, 10:22 AM

## 2023-06-10 NOTE — Progress Notes (Incomplete)
 [facility] - HEART FAILURE CLINIC - PHARMACIST COUNSELING NOTE  Todd Whitehead is a 78 y.o. year old male who presents to the HF clinic for follow up after ECHO. They have a past medical history significant for  HTN, anxiety, depression, COPD, hyperlipidemia, CAD (MI), CABG, anemia, MGUS, CKD, PVD, DM, carotid artery stenosis, current tobacco use and chronic heart failure. Initially, they were diagnosed with HFrEF in 2022 suspected due to severe COPD with an EF of 45-50%. At their last appointment on 05/08/2023, no changes were made, but resumed Entresto  after patient wasn't taking for the last week. Today they have their ECHO.    Current Guideline-Directed Medical Therapy (GDMT)  ACE/ARB/ARNI: Sacubitril -valsartan  49-51 mg twice daily Beta Blocker: Metoprolol  succinate 12.5 mg daily Aldosterone Antagonist: NA Diuretic: NA SGLT2i: Dapagliflozin  10 mg daily - are they fill (no fill history)  Vital Signs and Trends  Recent Trends BP Readings from Last 3 Encounters:  05/08/23 (!) 148/76  04/02/23 (!) 135/54  03/26/23 (!) 143/68   Wt Readings from Last 3 Encounters:  05/08/23 114 lb 6 oz (51.9 kg)  04/02/23 115 lb (52.2 kg)  03/18/23 114 lb (51.7 kg)   Today's visit HR *** BP *** Weight (pounds) ***  Do you check your weight daily? [] Yes [] No  Do you check you blood pressure daily [] Yes [] No   Cardiac Imaging  ECHO: Date 2/30/22, EF 45-50%   Changes Since Last Visit   Hospitalizations/ED visits (last 6 months): Date ***, CC *** Medication changes: ***  Pertinent Labs  Lab Results  Component Value Date   NA 137 03/19/2023   CL 105 03/19/2023   K 3.9 03/19/2023   CO2 25 03/19/2023   BUN 17 03/19/2023   CREATININE 0.96 03/19/2023   GFRNONAA >60 03/19/2023   CALCIUM  8.9 03/19/2023   PHOS 4.1 12/10/2020   ALBUMIN  3.9 03/15/2023   GLUCOSE 88 03/19/2023   Lab Results  Component Value Date   LDLCALC 29 01/27/2021   Lab Results  Component Value  Date   HGBA1C 6.0 (H) 03/16/2023    ASSESSMENT  Reason for today's visit   Patient presents alone/with family to assess current guideline directed medical therapy. This is a routine follow-up after ***.   Guideline-Directed Medical Therapy Evaluation  ACEi/ARB/ARNi Target Achieved [] Yes [x] No Up-titration candidate today [] Yes [] No Safety Concerns []  Hypotension []  Hyperkalemia Other ***  Beta-Blocker  Target Achieved [] Yes [x] No Up-titration candidate today [] Yes [x] No Safety Concerns []  Bradycardia []  Hypotension []  Asthma/COPD Other ***  MRA Patient is not on therapy because it is planned to be started this appointment  SGLT-2 Target Achieved [x] Yes [] No Up-titration candidate today [] Yes [x] No Follow-up patient is filling since last fill record is 09/26/2022  Loop  Patient is not on therapy because It is not indicated at this time for symptom control   Heart Failure Symptoms & Volume Status   Dyspnea on exertion [] Yes [] No  Fatigue [] Yes [] No  Lower extremity edema [] Yes [] No  Weight gain (> 5 lbs) [] Yes [] No  Cough or wheezing  [] Yes [] No  Abdominal bloating/Discomfort [] Yes [] No  Early satiety or poor appetite [] Yes [] No  Dizziness of lightheadedness  [] Yes [] No  # Pillows used at night:   Adherence Assessment  Do you ever forget to take your medication? [] Yes [] No  Do you ever skip doses due to side effects? [] Yes [] No  Do you have trouble affording your medicines? [] Yes [] No  Are you ever unable to pick up your  medication due to transportation difficulties? [] Yes [] No  Do you ever stop taking your medications because you don't believe they are helping? [] Yes [] No   Adherence strategy: *** Barriers to obtaining medications: *** Did you take your medications before your appointment today: [] Yes [] No  Preventative Care   Parameter Most Recent Result Goal/Status Current Medications  LDL 29 < 70 - 55 mg/dL controlled Statin: Atorvastatin  40  mg daily    A1c 6.0  7 - 8 %  NA  BP Control *** < 130/80 *** Additional: ***  ECHO 45-50% 6 - 12 months Scheduled 06/13/2023   PLAN  Medication Interventions:  -- Consider adding spironolactone today  -- Continue current regimen per NP Preventative Care Follow-up:  -- Consider repeat LDL since last was in 2022  Lab or imaging orders:  -- Updated ECHO today   Time spent: 15 minutes  Thank you for allowing pharmacy to participate in this patient's care.   Brytni Dray K Nataleah Scioneaux, Pharm.D. Pharmacy Resident 06/10/2023 8:04 AM

## 2023-06-17 ENCOUNTER — Ambulatory Visit: Payer: Self-pay | Admitting: Family

## 2023-06-17 ENCOUNTER — Telehealth: Payer: Self-pay | Admitting: Family

## 2023-06-17 NOTE — Progress Notes (Signed)
 Advanced [facility] Failure [facility] Note    PCP: Jimmy Moulding, MD (last seen 04/25) Cardiologist: Burney Carter, MD (last seen 12/24)  Chief Complaint: shortness of breath   HPI:  Todd Whitehead is a 78 y/o male with a history of HTN, anxiety, depression, COPD, hyperlipidemia, CAD (MI), CABG, anemia, MGUS, CKD, PVD, DM, carotid artery stenosis, current tobacco use and chronic heart failure.   LHC done 08/19/15 and showed: Mid LAD lesion, 80% stenosed. Mid Cx lesion, 85% stenosed. Mid RCA lesion, 100% stenosed. LM lesion, 50% stenosed. 3 vessel cad. Mildly reduced lv function  Echo 2/30/22: EF 45 to 50%, global hypokinesis, mild left ventricular hypertrophy, G1DD  Admitted 03/15/23 with dyspnea, wheezing, and hemoptysis. CTA negative for PE. Pulmonology consulted. Bronchoscopy with lavage and mucus removal. Hemoptysis thought to be due to pneumonia. Given antibiotics, steroids, oxygen with improvement of symptoms. Palliative consult obtained  Seen in HF clinic 04/25 and entresto  49/51mg  was resumed.   Echo 06/10/23: 55-60%, mild LVH, normal RV, moderate LAE, mild/ mod Todd  He presents today for a HF follow-up visit with a chief complaint of shortness of breath. Has associated fatigue. Sleeping well. Appetite good. Denies chest pain, palpitations, abdominal distention, pedal edema, dizziness or weight gain.    ROS: All systems negative except what is listed in HPI, PMH and Problem List   Past Medical History:  Diagnosis Date   Anxiety    Arthritis    CHF (congestive heart failure) (HCC)    Colon polyp    COPD (chronic obstructive pulmonary disease) (HCC)    smoker   Dysrhythmia    Glaucoma    Heart murmur    Hypertension    on  no meds   Low serum vitamin B12 11/18/2020   Melanoma (HCC)    Resected from Left upper arm.    Myocardial infarction [facility]) 2017   Peripheral vascular disease (HCC)    Prostate enlargement     Current Outpatient Medications  Medication Sig  Dispense Refill   acetaminophen  (TYLENOL ) 500 MG tablet Take 1 tablet (500 mg total) by mouth every 6 (six) hours as needed. 30 tablet 0   albuterol  (PROVENTIL ) (2.5 MG/3ML) 0.083% nebulizer solution Take 3 mLs (2.5 mg total) by nebulization every 6 (six) hours as needed for wheezing or shortness of breath. 75 mL 12   aspirin  EC 81 MG tablet Take 81 mg by mouth daily. Swallow whole.     atorvastatin  (LIPITOR) 40 MG tablet Take 40 mg by mouth daily.      brimonidine  (ALPHAGAN ) 0.15 % ophthalmic solution Place 1 drop into both eyes 2 (two) times daily.     busPIRone  (BUSPAR ) 30 MG tablet Take 30 mg by mouth 2 (two) times daily.     citalopram  (CELEXA ) 20 MG tablet Take 20 mg by mouth daily.     clopidogrel  (PLAVIX ) 75 MG tablet TAKE 1 TABLET(75 MG) BY MOUTH DAILY 30 tablet 5   dapagliflozin  propanediol (FARXIGA ) 10 MG TABS tablet Take 1 tablet (10 mg total) by mouth daily before breakfast. 90 tablet 3   Dextromethorphan -guaiFENesin  20-200 MG/20ML LIQD Take 10 mLs by mouth every 4 (four) hours as needed. (Patient not taking: Reported on 05/08/2023) 118 mL 0   ferrous sulfate  325 (65 FE) MG EC tablet TAKE 1 TABLET(325 MG) BY MOUTH TWICE DAILY WITH A MEAL 180 tablet 1   latanoprost  (XALATAN ) 0.005 % ophthalmic solution Place 1 drop into both eyes at bedtime.   2   metoprolol  succinate (TOPROL -XL)  25 MG 24 hr tablet Take 12.5 mg by mouth daily.     Multiple Vitamin (MULTIVITAMIN WITH MINERALS) TABS tablet Take 1 tablet by mouth daily. 30 tablet 0   Multiple Vitamins-Minerals (CENTRUM ADULT 50+ MULTIGUMMIES PO) Take 1 tablet by mouth daily.     sacubitril -valsartan  (ENTRESTO ) 49-51 MG Take 1 tablet by mouth 2 (two) times daily. 180 tablet 3   tamsulosin  (FLOMAX ) 0.4 MG CAPS capsule Take 1 capsule (0.4 mg total) by mouth daily after breakfast. 30 capsule 2   traZODone  (DESYREL ) 50 MG tablet Take 50 mg by mouth at bedtime.     No current facility-administered medications for this visit.    Allergies   Allergen Reactions   Other Nausea And Vomiting    Cheese,butter,sour cream,cottage cheese   Poison Ivy Extract Rash   Poison Oak Extract Rash      Social History   Socioeconomic History   Marital status: Divorced    Spouse name: Not on file   Number of children: Not on file   Years of education: Not on file   Highest education level: Not on file  Occupational History   Not on file  Tobacco Use   Smoking status: Every Day    Current packs/day: 1.00    Average packs/day: 1 pack/day for 58.0 years (58.0 ttl pk-yrs)    Types: Cigarettes   Smokeless tobacco: Never  Vaping Use   Vaping status: Former  Substance and Sexual Activity   Alcohol use: No   Drug use: No   Sexual activity: Not Currently  Other Topics Concern   Not on file  Social History Narrative   Not on file   Social Drivers of Health   Financial Resource Strain: Low Risk  (04/05/2023)   Received from [facility]   Overall Financial Resource Strain (CARDIA)    Difficulty of Paying Living Expenses: Not very hard  Food Insecurity: No Food Insecurity (04/05/2023)   Received from [facility]   Hunger Vital Sign    Worried About Running Out of Food in the Last Year: Never true    Ran Out of Food in the Last Year: Never true  Transportation Needs: No Transportation Needs (04/05/2023)   Received from [facility] - Transportation    In the past 12 months, has lack of transportation kept you from medical appointments or from getting medications?: No    Lack of Transportation (Non-Medical): No  Physical Activity: Not on file  Stress: Not on file  Social Connections: Moderately Integrated (03/16/2023)   Social Connection and Isolation Panel [NHANES]    Frequency of Communication with Friends and Family: Three times a week    Frequency of Social Gatherings with Friends and Family: Once a week    Attends Religious Services: More than 4 times per year     Active Member of [employer] or Organizations: Yes    Attends Banker Meetings: Never    Marital Status: Divorced  Catering manager Violence: Not At Risk (03/16/2023)   Humiliation, Afraid, Rape, and Kick questionnaire    Fear of Current or Ex-Partner: No    Emotionally Abused: No    Physically Abused: No    Sexually Abused: No      Family History  Problem Relation Age of Onset   Heart attack Father    Aneurysm Brother    Lung cancer Brother    Vitals:   06/18/23 0823  BP: 107/66  Pulse: 74  SpO2: 95%  Weight: 114 lb 3.2 oz (51.8 kg)   Wt Readings from Last 3 Encounters:  06/18/23 114 lb 3.2 oz (51.8 kg)  05/08/23 114 lb 6 oz (51.9 kg)  04/02/23 115 lb (52.2 kg)   Lab Results  Component Value Date   CREATININE 0.96 03/19/2023   CREATININE 0.96 03/18/2023   CREATININE 1.25 (H) 03/15/2023     PHYSICAL EXAM:  General: Frail appearing. No resp difficulty HEENT: normal Neck: supple, no JVD Cor: Regular rhythm, rate. No rubs, gallops or murmurs Lungs: clear Abdomen: soft, nontender, nondistended. Extremities: no cyanosis, clubbing, rash, edema Neuro: alert & oriented X 3. Moves all 4 extremities w/o difficulty. Affect pleasant    ECG: not done   ASSESSMENT & PLAN:  1: Chronic heart failure with now preserved ejection fraction- - suspect due to severe COPD - NYHA class III - euvolemic today - weighing daily; reminded to call for an overnight weight gain of > 2 pounds or a weekly weight gain of > 5 pounds - weight unchanged from last visit here 6 weeks ago - Echo 2/30/22: EF 45 to 50%, global hypokinesis, mild left ventricular hypertrophy, G1DD - Echo 06/10/23: 55-60%, mild LVH, normal RV, moderate LAE, mild/ mod Todd (reviewed results w/ patient) - continue farxiga  10mg  daily - continue metoprolol  succinate 12.5mg  daily - decrease entresto  to 24/26 mg BID (he will break his 49/51mg  dose & take 1/2 tab BID) This will allow BP room for MRA - begin  spironolactone 12.5mg  daily - BMET today and repeat in 2 weeks - BNP 08/03/21 reviewed and was 152.5  2: HTN- - BP 107/66 (med changes above) - saw PCP Todd Whitehead) 04/25 - BMP 03/19/23 reviewed: sodium 137, potassium 3.9, creatinine 0.96 & GFR >60 - BMET today  3: severe COPD- - has oxygen for PRN usage - saw pulmonology Todd Whitehead) 03/25  4: Tobacco use- - smoking 1.0 ppd of cigarettes; at his peak he was smoking 3 ppd - has been smoking since the age of 27 - cessation discussed but he's not interested in stoppage  5: Anemia/ MGUS- - saw hematology Todd Whitehead) 02/25 - hemoglobin 03/26/23 reviewed and was 9.9  6: CAD- - saw cardiology Todd Whitehead) 12/24 - continue atorvastatin  40mg  daily - continue plavix  75mg  daily - LDL 09/26/22 reviewed and was 25 - LHC done 08/19/15 and showed: Mid LAD lesion, 80% stenosed. Mid Cx lesion, 85% stenosed. Mid RCA lesion, 100% stenosed. LM lesion, 50% stenosed.    3 vessel cad. Mildly reduced lv function   Return in 2 weeks, sooner if needed.   Charlette Console, FNP 06/17/23

## 2023-06-17 NOTE — Telephone Encounter (Signed)
 Called to confirm/remind patient of their appointment at the [facility] on 06/18/23.   Appointment:   [] Confirmed  [] Left mess   [x] No answer/No voice mail  [] VM Full/unable to leave message  [] Phone not in service  Patient reminded to bring all medications and/or complete list.  Confirmed patient has transportation. Gave directions, instructed to utilize valet parking.

## 2023-06-18 ENCOUNTER — Encounter: Payer: Self-pay | Admitting: Family

## 2023-06-18 ENCOUNTER — Ambulatory Visit: Attending: Family | Admitting: Family

## 2023-06-18 VITALS — BP 107/66 | HR 74 | Wt 114.2 lb

## 2023-06-18 DIAGNOSIS — Z951 Presence of aortocoronary bypass graft: Secondary | ICD-10-CM | POA: Diagnosis not present

## 2023-06-18 DIAGNOSIS — Z7984 Long term (current) use of oral hypoglycemic drugs: Secondary | ICD-10-CM | POA: Diagnosis not present

## 2023-06-18 DIAGNOSIS — I130 Hypertensive heart and chronic kidney disease with heart failure and stage 1 through stage 4 chronic kidney disease, or unspecified chronic kidney disease: Secondary | ICD-10-CM | POA: Insufficient documentation

## 2023-06-18 DIAGNOSIS — D472 Monoclonal gammopathy: Secondary | ICD-10-CM | POA: Diagnosis not present

## 2023-06-18 DIAGNOSIS — I10 Essential (primary) hypertension: Secondary | ICD-10-CM

## 2023-06-18 DIAGNOSIS — Z720 Tobacco use: Secondary | ICD-10-CM

## 2023-06-18 DIAGNOSIS — J449 Chronic obstructive pulmonary disease, unspecified: Secondary | ICD-10-CM | POA: Diagnosis not present

## 2023-06-18 DIAGNOSIS — E1122 Type 2 diabetes mellitus with diabetic chronic kidney disease: Secondary | ICD-10-CM | POA: Insufficient documentation

## 2023-06-18 DIAGNOSIS — I5032 Chronic diastolic (congestive) heart failure: Secondary | ICD-10-CM

## 2023-06-18 DIAGNOSIS — D649 Anemia, unspecified: Secondary | ICD-10-CM

## 2023-06-18 DIAGNOSIS — Z79899 Other long term (current) drug therapy: Secondary | ICD-10-CM | POA: Insufficient documentation

## 2023-06-18 DIAGNOSIS — I2510 Atherosclerotic heart disease of native coronary artery without angina pectoris: Secondary | ICD-10-CM

## 2023-06-18 DIAGNOSIS — E785 Hyperlipidemia, unspecified: Secondary | ICD-10-CM | POA: Insufficient documentation

## 2023-06-18 DIAGNOSIS — Z8249 Family history of ischemic heart disease and other diseases of the circulatory system: Secondary | ICD-10-CM | POA: Insufficient documentation

## 2023-06-18 DIAGNOSIS — F17210 Nicotine dependence, cigarettes, uncomplicated: Secondary | ICD-10-CM | POA: Diagnosis not present

## 2023-06-18 DIAGNOSIS — E1151 Type 2 diabetes mellitus with diabetic peripheral angiopathy without gangrene: Secondary | ICD-10-CM | POA: Insufficient documentation

## 2023-06-18 DIAGNOSIS — N189 Chronic kidney disease, unspecified: Secondary | ICD-10-CM | POA: Diagnosis not present

## 2023-06-18 DIAGNOSIS — I252 Old myocardial infarction: Secondary | ICD-10-CM | POA: Diagnosis not present

## 2023-06-18 DIAGNOSIS — Z7902 Long term (current) use of antithrombotics/antiplatelets: Secondary | ICD-10-CM | POA: Diagnosis not present

## 2023-06-18 MED ORDER — SACUBITRIL-VALSARTAN 49-51 MG PO TABS: ORAL_TABLET | ORAL | Status: DC

## 2023-06-18 MED ORDER — SPIRONOLACTONE 25 MG PO TABS: 12.5000 mg | ORAL_TABLET | Freq: Every day | ORAL | 3 refills | Status: DC

## 2023-06-18 NOTE — Patient Instructions (Signed)
 Medication Changes:  DECREASE Entresto  1/2 tab two times daily  START Spironolactone 12.5mg  (1/2 tab) daily  Lab Work:  Go DOWN to LOWER LEVEL (LL) to have your blood work completed inside of [employer] office.  We will only call you if the results are abnormal or if the provider would like to make medication changes.   Follow-Up in: Please follow up with the [facility] in 2 weeks with Shawnee Dellen, FNP.  At the [facility], you and your health needs are our priority. We have a designated team specialized in the treatment of Heart Failure. This Care Team includes your primary Heart Failure Specialized Cardiologist (physician), Advanced Practice Providers (APPs- Physician Assistants and Nurse Practitioners), and Pharmacist who all work together to provide you with the care you need, when you need it.   You may see any of the following providers on your designated Care Team at your next follow up:  Dr. Jules Oar Dr. Peder Bourdon Dr. Alwin Baars Dr. Judyth Nunnery Shawnee Dellen, FNP Bevely Brush, RPH-CPP  Please be sure to bring in all your medications bottles to every appointment.   Need to Contact Us :  If you have any questions or concerns before your next appointment please send us  a message through [place] or call our office at [phone].    TO LEAVE A MESSAGE FOR THE NURSE SELECT OPTION 2, PLEASE LEAVE A MESSAGE INCLUDING: YOUR NAME DATE OF BIRTH CALL BACK NUMBER REASON FOR CALL**this is important as we prioritize the call backs  YOU WILL RECEIVE A CALL BACK THE SAME DAY AS LONG AS YOU CALL BEFORE 4:00 PM

## 2023-06-18 NOTE — Progress Notes (Signed)
 [facility] - HEART FAILURE CLINIC - PHARMACIST COUNSELING NOTE  Todd Whitehead is a 78 y.o. year old male who presents to the HF clinic for follow-up after their ECHO. They have a past medical history significant for HTN, anxiety, depression, COPD, hyperlipidemia, CAD (MI), CABG, anemia, MGUS, CKD, PVD, DM, carotid artery stenosis, current tobacco use and chronic heart failure. 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 At their last appointment on 05/08/2023, they reported being out of Entresto  for the last week.   Current Guideline-Directed Medical Therapy (GDMT)  ACE/ARB/ARNI: Sacubitril -valsartan  49-51 mg twice daily Beta Blocker: Metoprolol  succinate 12.5 mg daily Aldosterone Antagonist: na Diuretic: na SGLT2i: Empagliflozin 10 mg daily - patient getting through manufacturers assistance   Vital Signs and Trends  Recent Trends BP Readings from Last 3 Encounters:  05/08/23 (!) 148/76  04/02/23 (!) 135/54  03/26/23 (!) 143/68   Wt Readings from Last 3 Encounters:  05/08/23 114 lb 6 oz (51.9 kg)  04/02/23 115 lb (52.2 kg)  03/18/23 114 lb (51.7 kg)   Today's visit HR 74 BP 107/66 Weight (pounds) 114 lbs  Cardiac Imaging  ECHO:  -- Date 01/17/2021, EF 45-50% -- Date 06/10/2023, EF 55-60%  Changes Since Last Visit   Hospitalizations/ED visits (last 6 months):  -- Date - 03/15/23 - 03/19/23, [facility] - hemoptysis, Admission Dx - pneumonia, COPD exacerbation  Medication changes: NA  Pertinent Labs  Lab Results  Component Value Date   NA 137 03/19/2023   CL 105 03/19/2023   K 3.9 03/19/2023   CO2 25 03/19/2023   BUN 17 03/19/2023   CREATININE 0.96 03/19/2023   GFRNONAA >60 03/19/2023   CALCIUM  8.9 03/19/2023   PHOS 4.1 12/10/2020   ALBUMIN  3.9 03/15/2023   GLUCOSE 88 03/19/2023   Lab Results  Component Value Date   LDLCALC 29 01/27/2021   Lab Results  Component Value Date   HGBA1C 6.0 (H)  03/16/2023    ASSESSMENT  Reason for today's visit   To assess current guideline directed medical therapy. This is a routine follow-up after they received a follow-up ECHO. Made sure the patient was about to get their Entresto  and Farxiga .   Guideline-Directed Medical Therapy Evaluation  ACEi/ARB/ARNi Target Achieved [] Yes [x] No Up-titration candidate today [] Yes [x] No  Beta-Blocker  Target Achieved [] Yes [x] No Up-titration candidate today [] Yes [x] No  MRA Patient is not on therapy because it has not been added to therapy yet   SGLT-2 Target Achieved [x] Yes [] No Up-titration candidate today [] Yes [x] No  Loop  Patient is not on therapy because they do no require it at this time   Adherence strategy: take their medication out of their containers Barriers to obtaining medications: Patient is able to get their Entresto  for $12 and get their Farxiga  through the manufacturers program (mailed to them)  Preventative Care   Parameter Most Recent Result Goal/Status Current Medications  LDL 25 09/26/2022 < 70 - 55 mg/dL Within goal Statin: Atorvastatin  40 mg daily   A1c 6.0  7 - 8 % Within goal NA  BP Control 107/66 < 130/80 soft Additional: NA  ECHO 55-60% 6 - 12 months Last 06/10/2023   PLAN  Medication Interventions:  -- Continue regimen per NP  -- Consider adding spironolactone to complete GDMT  Preventative Care Follow-up:  -- No interventions at this time  Lab or imaging orders:  -- ECHO up to date  -- BMET  Time spent: 15 minutes  Thank you for allowing pharmacy to participate in this patient's care.   Aadya Kindler K Micayla Brathwaite, Pharm.D. Pharmacy Resident 06/18/2023 7:10 AM

## 2023-06-19 ENCOUNTER — Ambulatory Visit: Payer: Self-pay | Admitting: Family

## 2023-06-19 LAB — BASIC METABOLIC PANEL WITH GFR
BUN/Creatinine Ratio: 13 (ref 10–24)
BUN: 21 mg/dL (ref 8–27)
CO2: 21 mmol/L (ref 20–29)
Calcium: 9.6 mg/dL (ref 8.6–10.2)
Chloride: 100 mmol/L (ref 96–106)
Creatinine, Ser: 1.56 mg/dL — ABNORMAL HIGH (ref 0.76–1.27)
Glucose: 87 mg/dL (ref 70–99)
Potassium: 5.2 mmol/L (ref 3.5–5.2)
Sodium: 138 mmol/L (ref 134–144)
eGFR: 45 mL/min/{1.73_m2} — ABNORMAL LOW (ref >59)

## 2023-06-19 NOTE — Telephone Encounter (Signed)
 Spoke to pt. Pt agreeable to not starting spiro and avoiding high potassium foods. No further questions at this time.

## 2023-06-19 NOTE — Telephone Encounter (Signed)
-----   Message from Charlette Console sent at 06/19/2023  7:58 AM EDT ----- Kidney function a little worse and potassium level is a little high. HOLD spironolactone (we started it yesterday) until we recheck labs at next visit. Do not use NoSalt for seasoning and decrease consumption of high potassium foods like bananas, potatoes and dark green leafy vegetables.   Keep the entresto  as 1/2 tablet BID as we discussed.

## 2023-07-01 ENCOUNTER — Telehealth: Payer: Self-pay | Admitting: Family

## 2023-07-01 NOTE — Telephone Encounter (Signed)
 Called to confirm/remind patient of their appointment at the [facility] on 07/02/23.   Appointment:   [x] Confirmed  [] Left mess   [] No answer/No voice mail  [] VM Full/unable to leave message  [] Phone not in service  Patient reminded to bring all medications and/or complete list.  Confirmed patient has transportation. Gave directions, instructed to utilize valet parking.

## 2023-07-01 NOTE — Progress Notes (Signed)
 [facility] Note    PCP: Jimmy Moulding, MD (last seen 04/25) Cardiologist: Burney Carter, MD (last seen 12/24; returns 06/25)  Chief Complaint: shortness of breath   HPI:  Todd Whitehead is a 78 y/o male with a history of HTN, anxiety, depression, COPD, hyperlipidemia, CAD (MI), CABG, anemia, MGUS, CKD, PVD, DM, carotid artery stenosis, current tobacco use and chronic heart failure.   LHC done 08/19/15 and showed: Mid LAD lesion, 80% stenosed. Mid Cx lesion, 85% stenosed. Mid RCA lesion, 100% stenosed. LM lesion, 50% stenosed. 3 vessel cad. Mildly reduced lv function  Echo 2/30/22: EF 45 to 50%, global hypokinesis, mild left ventricular hypertrophy, G1DD  Admitted 03/15/23 with dyspnea, wheezing, and hemoptysis. CTA negative for PE. Pulmonology consulted. Bronchoscopy with lavage and mucus removal. Hemoptysis thought to be due to pneumonia. Given antibiotics, steroids, oxygen with improvement of symptoms. Palliative consult obtained  Seen in HF clinic 04/25 and entresto  49/51mg  was resumed.   Echo 06/10/23: 55-60%, mild LVH, normal RV, moderate LAE, mild/ mod Todd  Seen in [facility] 05/25 and entresto  decreased to 24/26mg  BID and spiro 12.5mg  daily was started. After lab results obtained, patient told to hold spiro due to K+ of 5.2.  He presents today for a HF follow-up visit with a chief complaint of moderate shortness of breath. Was short of breath upon walking into the office but resolves quickly upon rest. Has associated fatigue. Denies chest pain, palpitations, abdominal distention, pedal edema, dizziness, weight gain or difficulty sleeping. Currently holding spironolactone  due to elevated K+.   ROS: All systems negative except what is listed in HPI, PMH and Problem List   Past Medical History:  Diagnosis Date   Anxiety    Arthritis    CHF (congestive heart failure) (HCC)    Colon polyp    COPD (chronic obstructive pulmonary disease) (HCC)    smoker    Dysrhythmia    Glaucoma    Heart murmur    Hypertension    on  no meds   Low serum vitamin B12 11/18/2020   Melanoma (HCC)    Resected from Left upper arm.    Myocardial infarction [facility]) 2017   Peripheral vascular disease (HCC)    Prostate enlargement     Current Outpatient Medications  Medication Sig Dispense Refill   acetaminophen  (TYLENOL ) 500 MG tablet Take 1 tablet (500 mg total) by mouth every 6 (six) hours as needed. 30 tablet 0   aspirin  EC 81 MG tablet Take 81 mg by mouth daily. Swallow whole.     atorvastatin  (LIPITOR) 40 MG tablet Take 40 mg by mouth daily.      brimonidine  (ALPHAGAN ) 0.15 % ophthalmic solution Place 1 drop into both eyes 2 (two) times daily.     busPIRone  (BUSPAR ) 30 MG tablet Take 30 mg by mouth 2 (two) times daily.     citalopram  (CELEXA ) 20 MG tablet Take 20 mg by mouth daily.     clopidogrel  (PLAVIX ) 75 MG tablet TAKE 1 TABLET(75 MG) BY MOUTH DAILY 30 tablet 5   dapagliflozin  propanediol (FARXIGA ) 10 MG TABS tablet Take 1 tablet (10 mg total) by mouth daily before breakfast. 90 tablet 3   ferrous sulfate  325 (65 FE) MG EC tablet TAKE 1 TABLET(325 MG) BY MOUTH TWICE DAILY WITH A MEAL 180 tablet 1   latanoprost  (XALATAN ) 0.005 % ophthalmic solution Place 1 drop into both eyes at bedtime.   2   metoprolol  succinate (TOPROL -XL) 25 MG 24 hr tablet Take  12.5 mg by mouth daily.     Multiple Vitamin (MULTIVITAMIN WITH MINERALS) TABS tablet Take 1 tablet by mouth daily. 30 tablet 0   Multiple Vitamins-Minerals (CENTRUM ADULT 50+ MULTIGUMMIES PO) Take 1 tablet by mouth daily.     sacubitril -valsartan  (ENTRESTO ) 49-51 MG Take 1/2 tab two times daily     spironolactone  (ALDACTONE ) 25 MG tablet Take 0.5 tablets (12.5 mg total) by mouth daily. 45 tablet 3   tamsulosin  (FLOMAX ) 0.4 MG CAPS capsule Take 1 capsule (0.4 mg total) by mouth daily after breakfast. 30 capsule 2   traZODone  (DESYREL ) 50 MG tablet Take 50 mg by mouth at bedtime.     No current  facility-administered medications for this visit.    Allergies  Allergen Reactions   Other Nausea And Vomiting    Cheese,butter,sour cream,cottage cheese   Poison Ivy Extract Rash   Poison Oak Extract Rash      Social History   Socioeconomic History   Marital status: Divorced    Spouse name: Not on file   Number of children: Not on file   Years of education: Not on file   Highest education level: Not on file  Occupational History   Not on file  Tobacco Use   Smoking status: Every Day    Current packs/day: 1.00    Average packs/day: 1 pack/day for 58.0 years (58.0 ttl pk-yrs)    Types: Cigarettes   Smokeless tobacco: Never  Vaping Use   Vaping status: Former  Substance and Sexual Activity   Alcohol use: No   Drug use: No   Sexual activity: Not Currently  Other Topics Concern   Not on file  Social History Narrative   Not on file   Social Drivers of Health   Financial Resource Strain: Low Risk  (04/05/2023)   Received from [facility]   Overall Financial Resource Strain (CARDIA)    Difficulty of Paying Living Expenses: Not very hard  Food Insecurity: No Food Insecurity (04/05/2023)   Received from [facility]   Hunger Vital Sign    Worried About Running Out of Food in the Last Year: Never true    Ran Out of Food in the Last Year: Never true  Transportation Needs: No Transportation Needs (04/05/2023)   Received from [facility] - Transportation    In the past 12 months, has lack of transportation kept you from medical appointments or from getting medications?: No    Lack of Transportation (Non-Medical): No  Physical Activity: Not on file  Stress: Not on file  Social Connections: Moderately Integrated (03/16/2023)   Social Connection and Isolation Panel [NHANES]    Frequency of Communication with Friends and Family: Three times a week    Frequency of Social Gatherings with Friends and Family: Once a week     Attends Religious Services: More than 4 times per year    Active Member of [employer] or Organizations: Yes    Attends Banker Meetings: Never    Marital Status: Divorced  Catering manager Violence: Not At Risk (03/16/2023)   Humiliation, Afraid, Rape, and Kick questionnaire    Fear of Current or Ex-Partner: No    Emotionally Abused: No    Physically Abused: No    Sexually Abused: No      Family History  Problem Relation Age of Onset   Heart attack Father    Aneurysm Brother    Lung cancer Brother  Vitals:   07/02/23 0825  BP: 102/64  Pulse: 73  SpO2: 96%  Weight: 112 lb 6.4 oz (51 kg)   Wt Readings from Last 3 Encounters:  07/02/23 112 lb 6.4 oz (51 kg)  06/18/23 114 lb 3.2 oz (51.8 kg)  05/08/23 114 lb 6 oz (51.9 kg)   Lab Results  Component Value Date   CREATININE 1.56 (H) 06/18/2023   CREATININE 0.96 03/19/2023   CREATININE 0.96 03/18/2023   PHYSICAL EXAM:  General: Thin appearing. No resp difficulty HEENT: normal Neck: supple, no JVD Cor: Regular rhythm, rate. No rubs, gallops or murmurs Lungs: clear Abdomen: soft, nontender, nondistended. Extremities: no cyanosis, clubbing, rash, edema Neuro: alert & oriented X 3. Moves all 4 extremities w/o difficulty. Affect pleasant   ECG: not done   ASSESSMENT & PLAN:  1: Chronic heart failure with preserved ejection fraction- - suspect due to severe COPD - NYHA class III - euvolemic today - weighing daily; reminded to call for an overnight weight gain of > 2 pounds or a weekly weight gain of > 5 pounds - weight down 2 pounds from last visit here 2 weeks ago - Echo 2/30/22: EF 45 to 50%, global hypokinesis, mild left ventricular hypertrophy, G1DD - Echo 06/10/23: 55-60%, mild LVH, normal RV, moderate LAE, mild/ mod Todd (reviewed results w/ patient) - continue farxiga  10mg  daily - continue metoprolol  succinate 12.5mg  daily - continue entresto  24/26 mg BID; currently breaking 49/51mg  tablets in half  to use up his supply - spironolactone  currently on hold due to hyperkalemia - BMET today & if able to start spiro, will call patient - BNP 08/03/21 reviewed and was 152.5  2: HTN- - BP 102/64 - saw PCP Alva Jewels) 04/25 - BMP 06/18/23 reviewed: sodium 138, potassium 5.2, creatinine 1.56 & GFR 45 - BMET today  3: severe COPD- - has oxygen for PRN usage - saw pulmonology Jamal Mays) 03/25  4: Tobacco use- - smoking 1.0 ppd of cigarettes; at his peak he was smoking 3 ppd - has been smoking since the age of 34 - cessation discussed but he's not interested in stoppage  5: Anemia/ MGUS- - saw hematology Wilhelmenia Harada) 02/25 - hemoglobin 03/26/23 reviewed and was 9.9  6: CAD- - saw cardiology Beau Bound) 12/24; returns sometime this month - continue atorvastatin  40mg  daily - continue plavix  75mg  daily - LDL 09/26/22 reviewed and was 25 - LHC done 08/19/15 and showed: Mid LAD lesion, 80% stenosed. Mid Cx lesion, 85% stenosed. Mid RCA lesion, 100% stenosed. LM lesion, 50% stenosed.    3 vessel cad. Mildly reduced lv function   Return in 3 months, sooner if needed.   Charlette Console, FNP 07/01/23

## 2023-07-02 ENCOUNTER — Encounter: Payer: Self-pay | Admitting: Family

## 2023-07-02 ENCOUNTER — Ambulatory Visit: Attending: Family | Admitting: Family

## 2023-07-02 VITALS — BP 102/64 | HR 73 | Wt 112.4 lb

## 2023-07-02 DIAGNOSIS — Z7984 Long term (current) use of oral hypoglycemic drugs: Secondary | ICD-10-CM | POA: Diagnosis not present

## 2023-07-02 DIAGNOSIS — I130 Hypertensive heart and chronic kidney disease with heart failure and stage 1 through stage 4 chronic kidney disease, or unspecified chronic kidney disease: Secondary | ICD-10-CM | POA: Diagnosis not present

## 2023-07-02 DIAGNOSIS — I2510 Atherosclerotic heart disease of native coronary artery without angina pectoris: Secondary | ICD-10-CM | POA: Insufficient documentation

## 2023-07-02 DIAGNOSIS — D472 Monoclonal gammopathy: Secondary | ICD-10-CM | POA: Diagnosis not present

## 2023-07-02 DIAGNOSIS — E875 Hyperkalemia: Secondary | ICD-10-CM | POA: Insufficient documentation

## 2023-07-02 DIAGNOSIS — Z720 Tobacco use: Secondary | ICD-10-CM

## 2023-07-02 DIAGNOSIS — I252 Old myocardial infarction: Secondary | ICD-10-CM | POA: Diagnosis not present

## 2023-07-02 DIAGNOSIS — F419 Anxiety disorder, unspecified: Secondary | ICD-10-CM | POA: Insufficient documentation

## 2023-07-02 DIAGNOSIS — F17210 Nicotine dependence, cigarettes, uncomplicated: Secondary | ICD-10-CM | POA: Diagnosis not present

## 2023-07-02 DIAGNOSIS — R5383 Other fatigue: Secondary | ICD-10-CM | POA: Diagnosis not present

## 2023-07-02 DIAGNOSIS — D649 Anemia, unspecified: Secondary | ICD-10-CM | POA: Insufficient documentation

## 2023-07-02 DIAGNOSIS — E1151 Type 2 diabetes mellitus with diabetic peripheral angiopathy without gangrene: Secondary | ICD-10-CM | POA: Diagnosis not present

## 2023-07-02 DIAGNOSIS — I5032 Chronic diastolic (congestive) heart failure: Secondary | ICD-10-CM | POA: Insufficient documentation

## 2023-07-02 DIAGNOSIS — J449 Chronic obstructive pulmonary disease, unspecified: Secondary | ICD-10-CM | POA: Diagnosis not present

## 2023-07-02 DIAGNOSIS — E785 Hyperlipidemia, unspecified: Secondary | ICD-10-CM | POA: Insufficient documentation

## 2023-07-02 DIAGNOSIS — E1122 Type 2 diabetes mellitus with diabetic chronic kidney disease: Secondary | ICD-10-CM | POA: Insufficient documentation

## 2023-07-02 DIAGNOSIS — I10 Essential (primary) hypertension: Secondary | ICD-10-CM

## 2023-07-02 DIAGNOSIS — Z79899 Other long term (current) drug therapy: Secondary | ICD-10-CM | POA: Insufficient documentation

## 2023-07-02 DIAGNOSIS — N189 Chronic kidney disease, unspecified: Secondary | ICD-10-CM | POA: Diagnosis not present

## 2023-07-02 DIAGNOSIS — I6529 Occlusion and stenosis of unspecified carotid artery: Secondary | ICD-10-CM | POA: Diagnosis not present

## 2023-07-02 DIAGNOSIS — Z7902 Long term (current) use of antithrombotics/antiplatelets: Secondary | ICD-10-CM | POA: Insufficient documentation

## 2023-07-02 DIAGNOSIS — F32A Depression, unspecified: Secondary | ICD-10-CM | POA: Diagnosis not present

## 2023-07-02 MED ORDER — SACUBITRIL-VALSARTAN 24-26 MG PO TABS: 1.0000 | ORAL_TABLET | Freq: Two times a day (BID) | ORAL | 3 refills | Status: DC

## 2023-07-02 NOTE — Patient Instructions (Signed)
 Medication Changes:  No medication changes today!  Lab Work:  Go DOWN to LOWER LEVEL (LL) to have your blood work completed inside of [employer] office.  We will only call you if the results are abnormal or if the provider would like to make medication changes.   Follow-Up in: Please follow up with the [facility] in 3 months with Shawnee Dellen, FNP.  At the [facility], you and your health needs are our priority. We have a designated team specialized in the treatment of Heart Failure. This Care Team includes your primary Heart Failure Specialized Cardiologist (physician), Advanced Practice Providers (APPs- Physician Assistants and Nurse Practitioners), and Pharmacist who all work together to provide you with the care you need, when you need it.   You may see any of the following providers on your designated Care Team at your next follow up:  Dr. Jules Oar Dr. Peder Bourdon Dr. Alwin Baars Dr. Judyth Nunnery Shawnee Dellen, FNP Bevely Brush, RPH-CPP  Please be sure to bring in all your medications bottles to every appointment.   Need to Contact Us :  If you have any questions or concerns before your next appointment please send us  a message through [place] or call our office at [phone].    TO LEAVE A MESSAGE FOR THE NURSE SELECT OPTION 2, PLEASE LEAVE A MESSAGE INCLUDING: YOUR NAME DATE OF BIRTH CALL BACK NUMBER REASON FOR CALL**this is important as we prioritize the call backs  YOU WILL RECEIVE A CALL BACK THE SAME DAY AS LONG AS YOU CALL BEFORE 4:00 PM

## 2023-07-03 ENCOUNTER — Ambulatory Visit: Payer: Self-pay | Admitting: Family

## 2023-07-03 DIAGNOSIS — I5032 Chronic diastolic (congestive) heart failure: Secondary | ICD-10-CM

## 2023-07-03 LAB — BASIC METABOLIC PANEL WITH GFR
BUN/Creatinine Ratio: 17 (ref 10–24)
BUN: 28 mg/dL — ABNORMAL HIGH (ref 8–27)
CO2: 20 mmol/L (ref 20–29)
Calcium: 9.5 mg/dL (ref 8.6–10.2)
Chloride: 102 mmol/L (ref 96–106)
Creatinine, Ser: 1.69 mg/dL — ABNORMAL HIGH (ref 0.76–1.27)
Glucose: 109 mg/dL — ABNORMAL HIGH (ref 70–99)
Potassium: 4.9 mmol/L (ref 3.5–5.2)
Sodium: 136 mmol/L (ref 134–144)
eGFR: 41 mL/min/{1.73_m2} — ABNORMAL LOW (ref >59)

## 2023-07-05 NOTE — Telephone Encounter (Signed)
 Spoke with pt about lab results and meds. Pt states that he has picked up his spironolactone  but has not started it. He agreeable to not start it and to have lab work done in 1 month

## 2023-07-05 NOTE — Telephone Encounter (Signed)
-----   Message from Charlette Console sent at 07/03/2023  8:29 AM EDT ----- Potassium is normal but kidney function is worsening a little. Make sure drinking 60-64 ounces of fluid daily. Do NOT start spironolactone  (new medication).  Recheck BMET in 1 month

## 2023-07-17 IMAGING — CR DG CHEST 2V
1 series · 4 of 4 positions shown · non-contrast
Comparison: Chest x-ray dated December 09, 2020

CLINICAL DATA: Shortness of breath

EXAM:
CHEST - 2 VIEW

[Series 1: dg chest 2 view · 0.14mm/px · 4 of 4 slices shown]
[im 1/4]
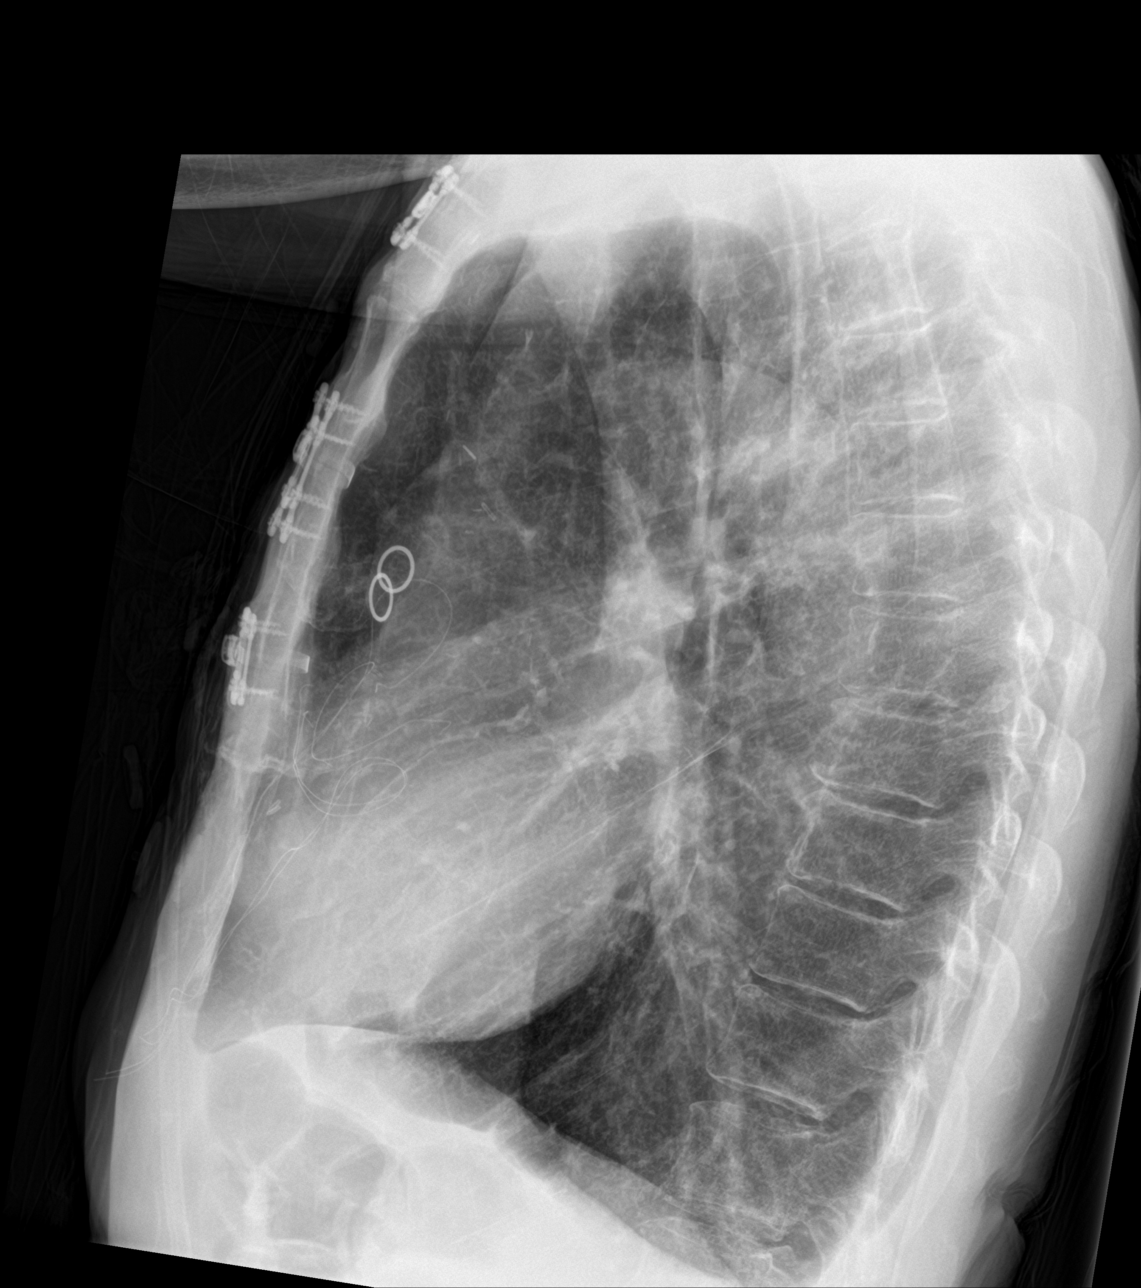
[im 2/4]
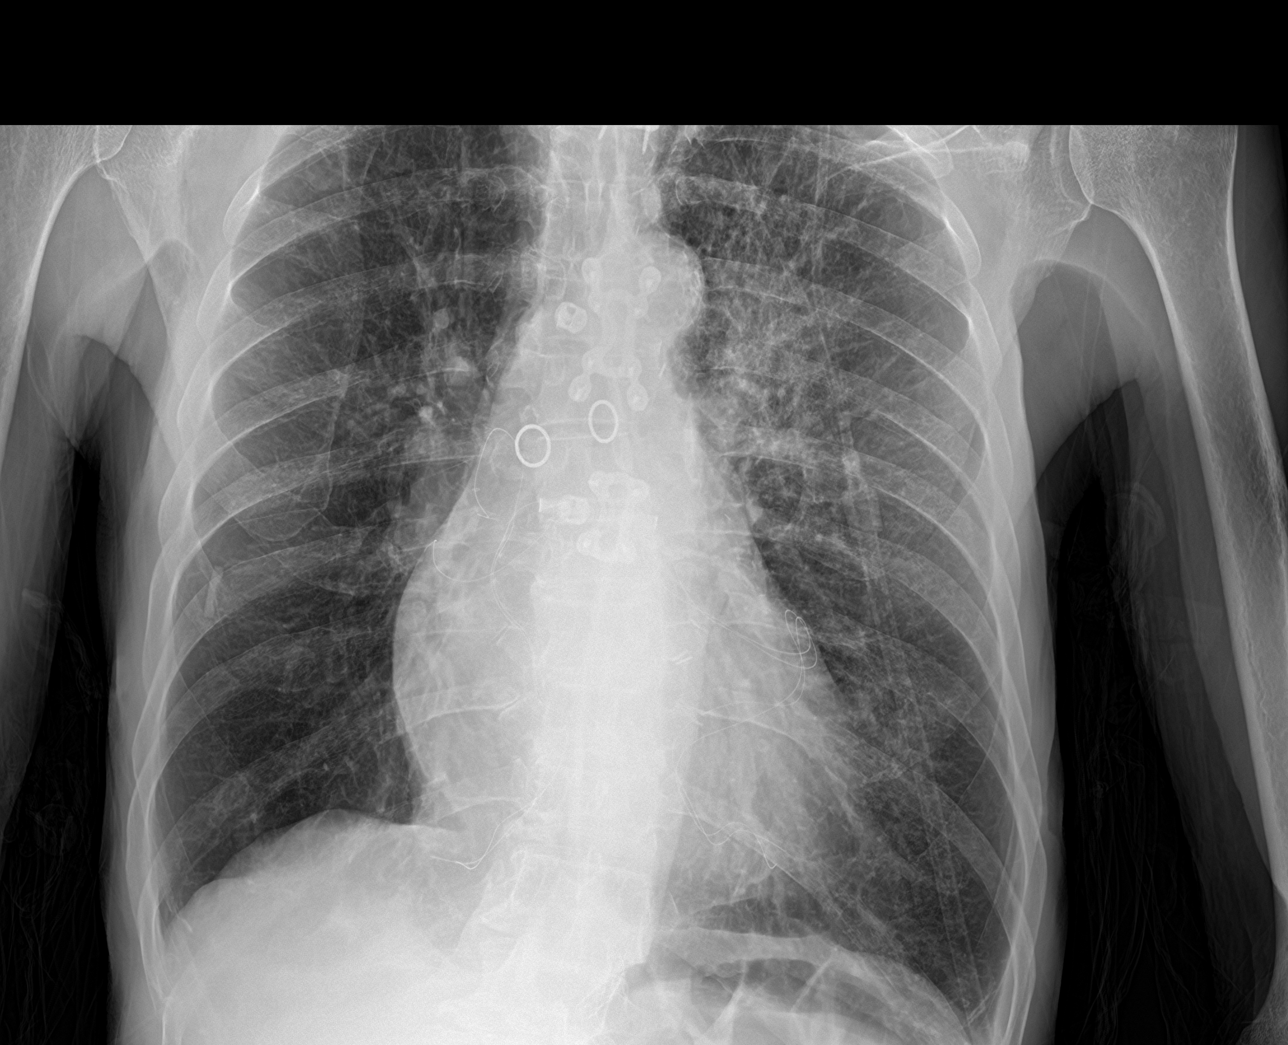
[im 3/4]
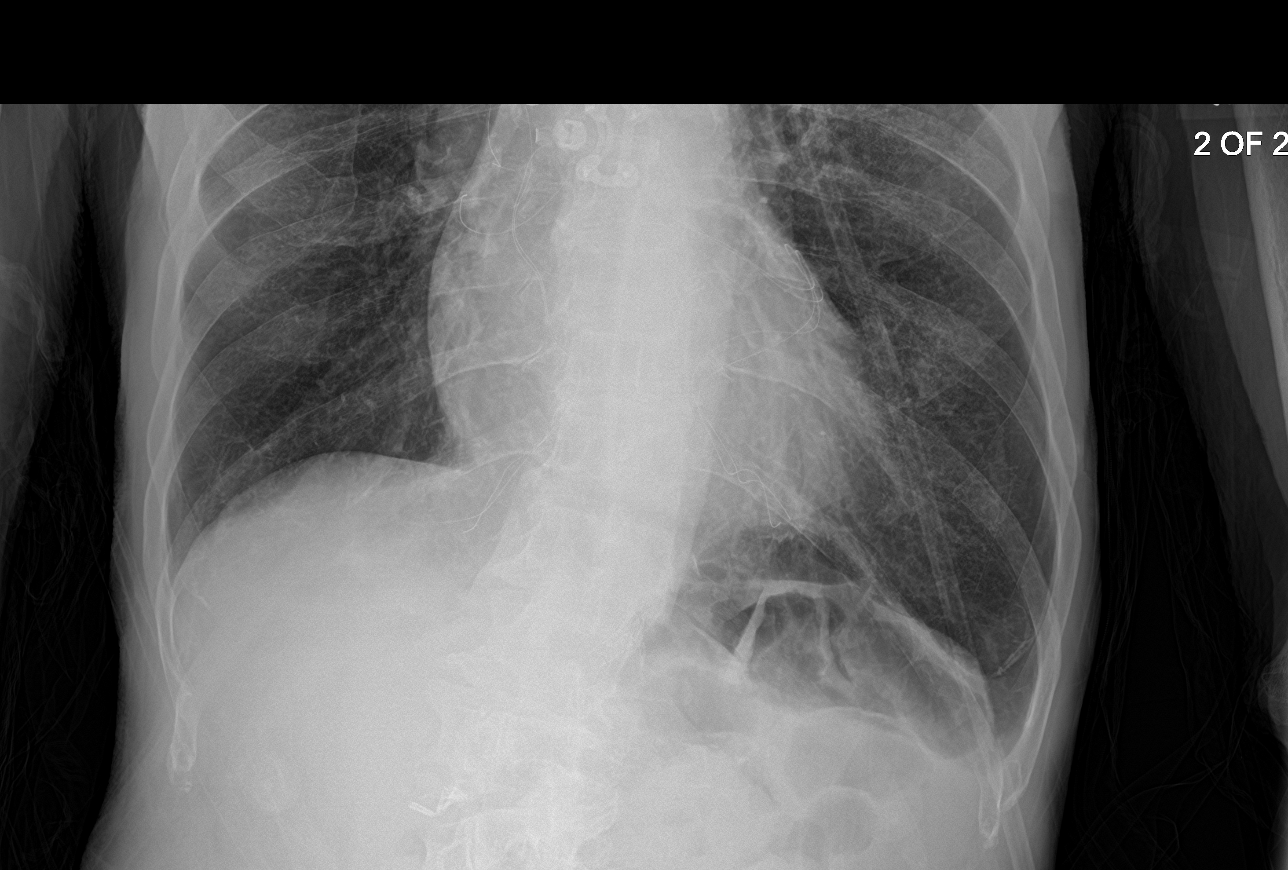
[im 4/4]
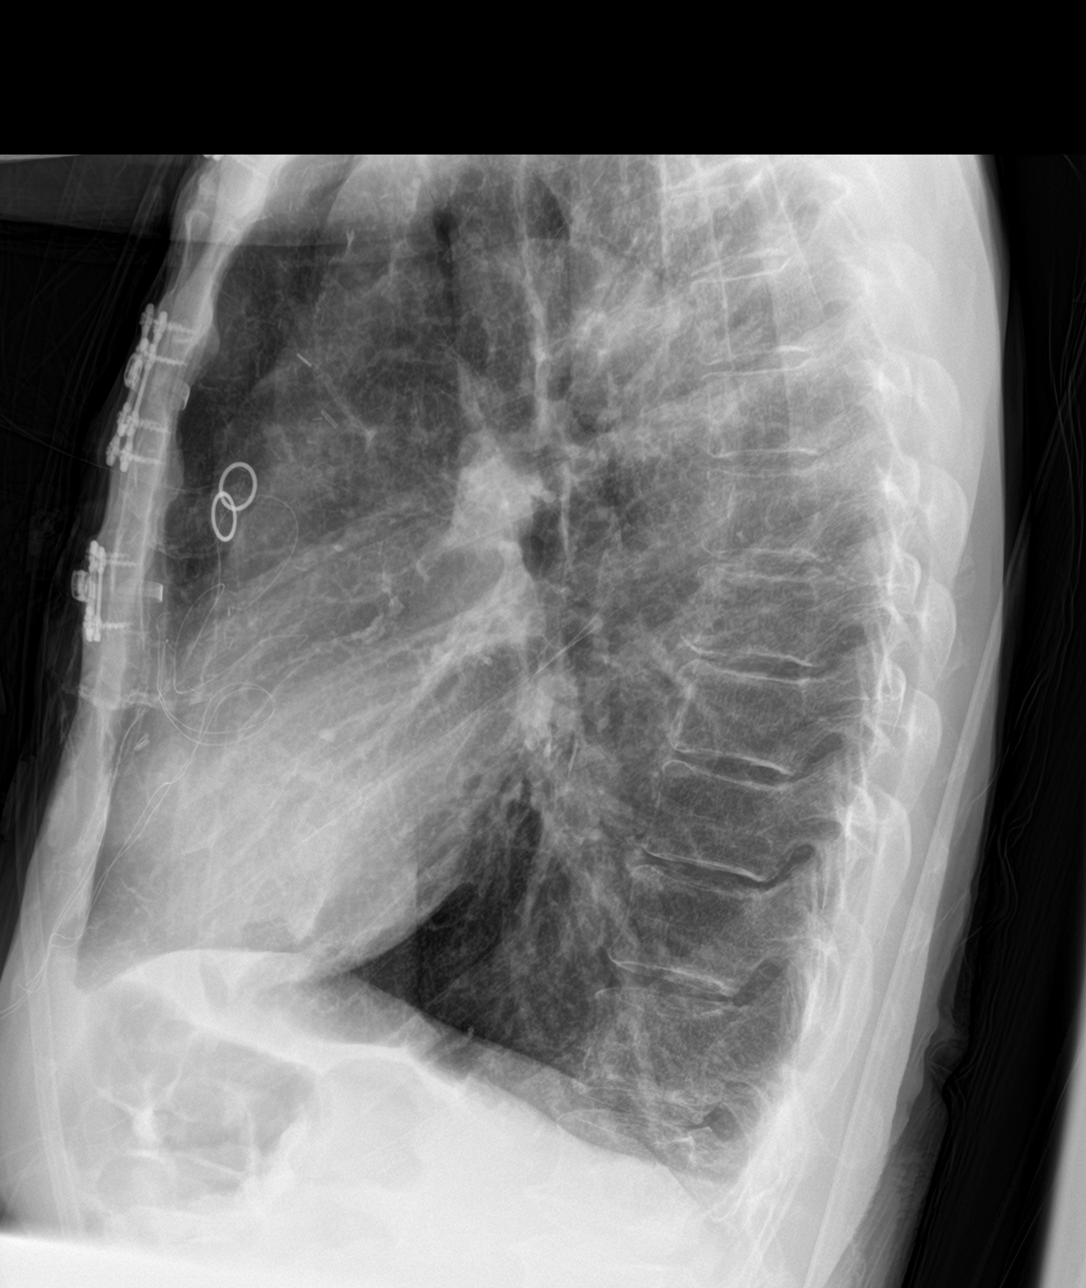

[4 of 4 positions shown; findings below may reference images not displayed]

FINDINGS: Cardiac and mediastinal contours are unchanged status post CABG.
Heterogeneous opacities of the left upper lung. No large pleural
effusion or pneumothorax.
IMPRESSION: Heterogeneous opacities of the left upper lung, possibly due to
infection or edema, pulmonary hemorrhage is also a consideration
given history of hemoptysis. Recommend follow-up chest x-ray in 6-8
weeks to ensure resolution.

## 2023-07-17 IMAGING — CT CT ANGIO CHEST
2 of 7 series · 18 of 46 positions shown · IV contrast (APPLIED)
Comparison: Chest x-ray 01/26/2021, CT 12/12/2020, 07/30/2018

CLINICAL DATA: Shortness of breath with hemoptysis

EXAM:
CT ANGIOGRAPHY CHEST WITH CONTRAST
TECHNIQUE: Multidetector CT imaging of the chest was performed using the
standard protocol during bolus administration of intravenous
contrast. Multiplanar CT image reconstructions and MIPs were
obtained to evaluate the vascular anatomy.
CONTRAST:  75mL OMNIPAQUE IOHEXOL 350 MG/ML SOLN

[Series 6: thins · axial · 0.65mm/px · z∈[+1258,+1597]mm · 15 of 471 slices shown]
[im 24/471  lung]
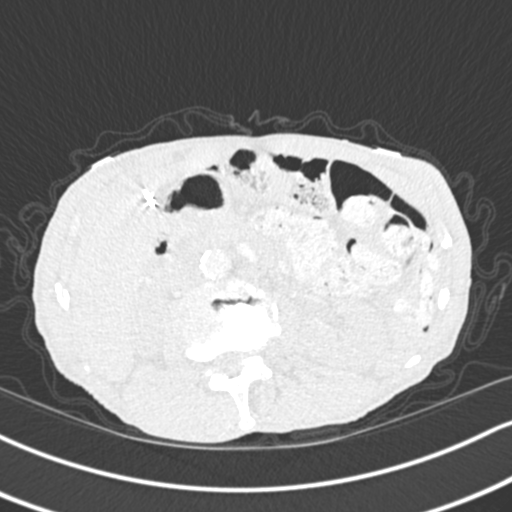
[im 48/471  soft-tissue]
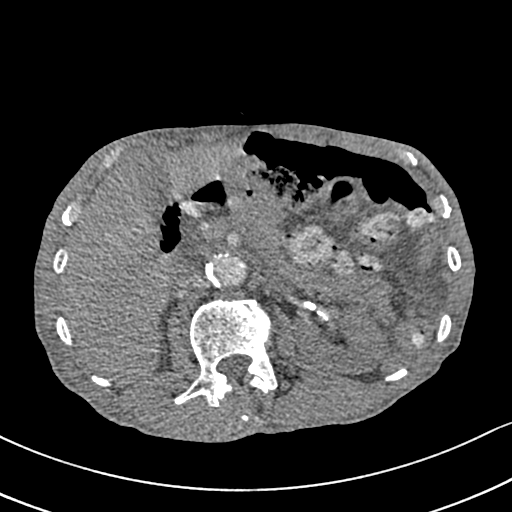
[im 95/471  lung]
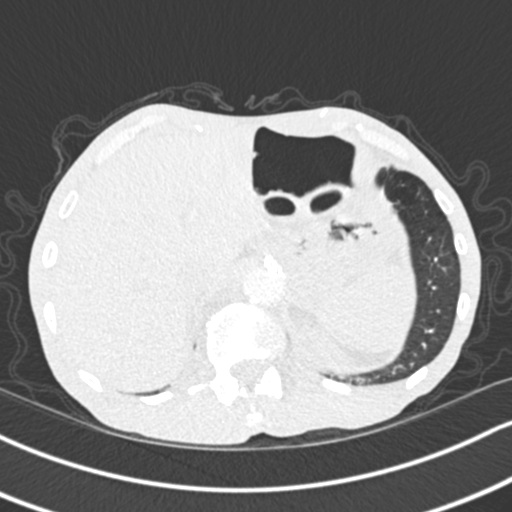
[im 118/471  soft-tissue]
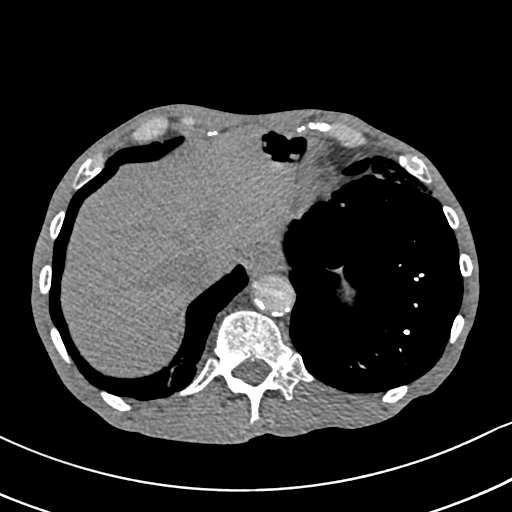
[im 142/471  lung]
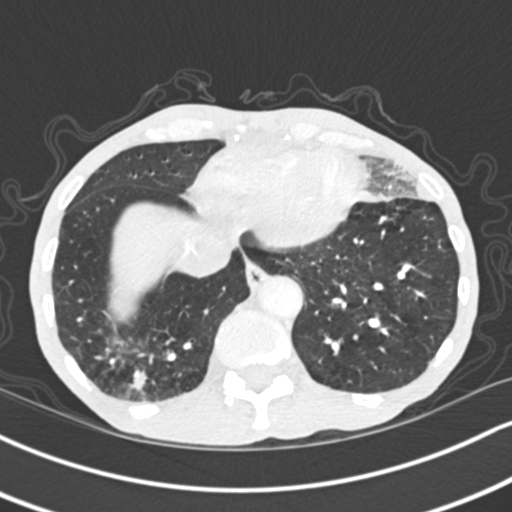
[im 165/471  soft-tissue]
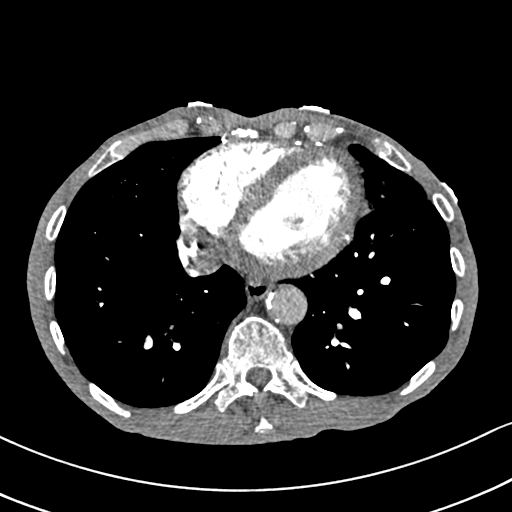
[im 212/471  lung]
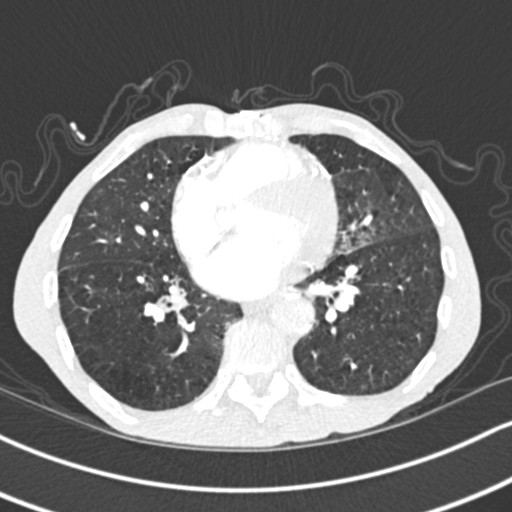
[im 236/471  soft-tissue]
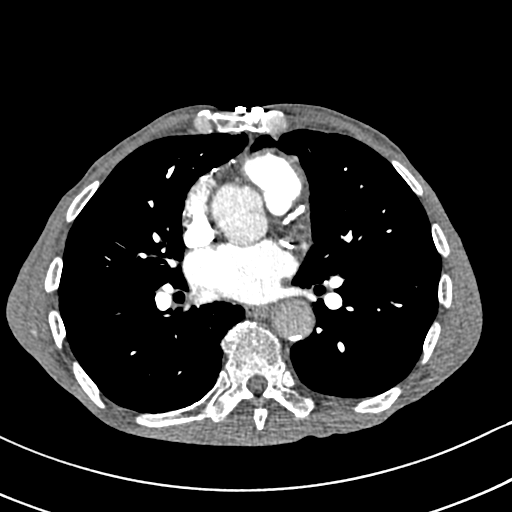
[im 259/471  lung]
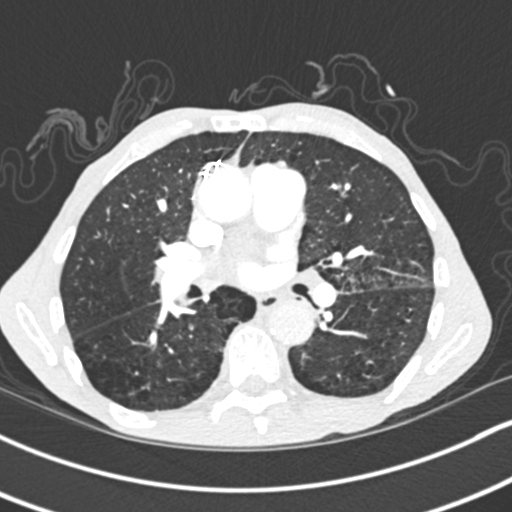
[im 306/471  soft-tissue]
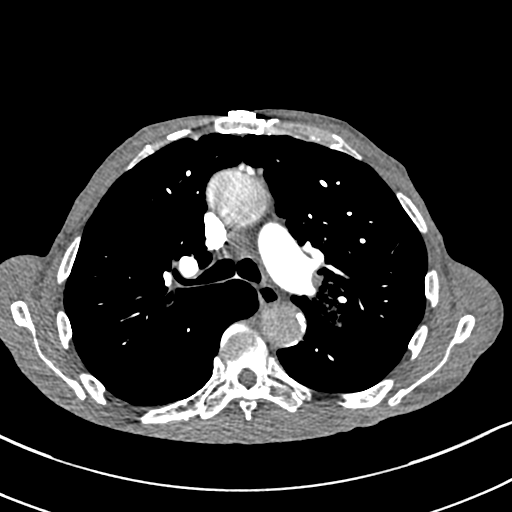
[im 330/471  lung]
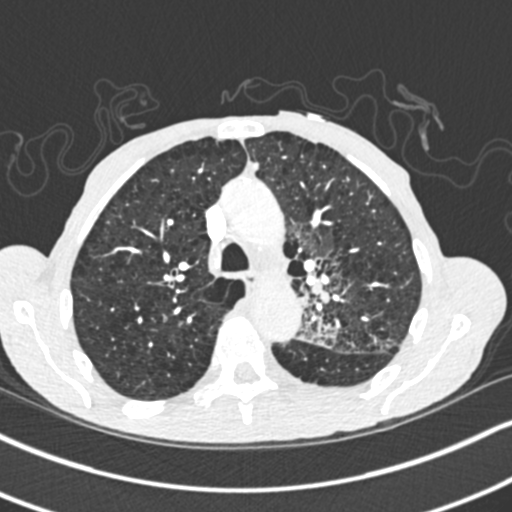
[im 353/471  soft-tissue]
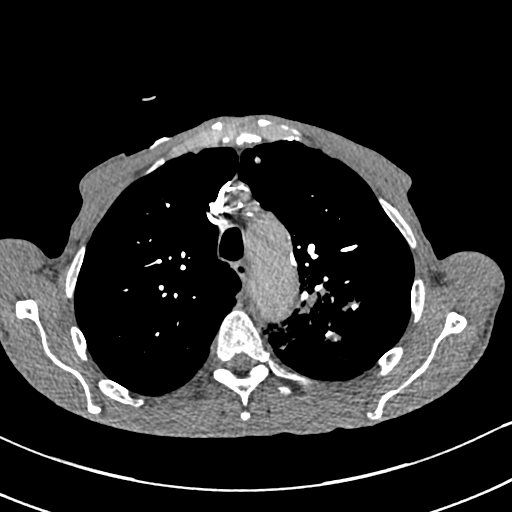
[im 377/471  lung]
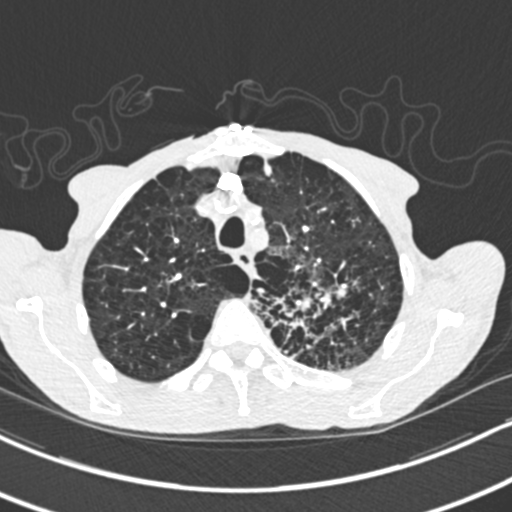
[im 424/471  soft-tissue]
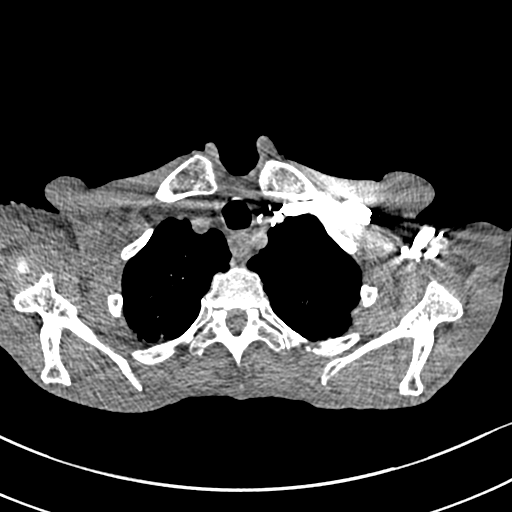
[im 447/471  lung]
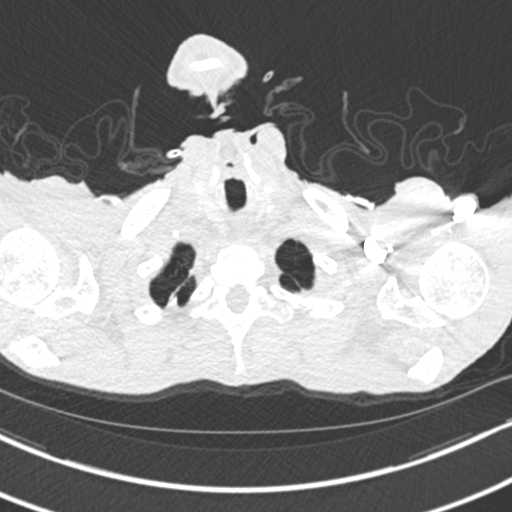

[Series 8: coronal mpr · coronal · 0.63mm/px · 3 of 73 slices shown]
[im 19/73  soft-tissue]
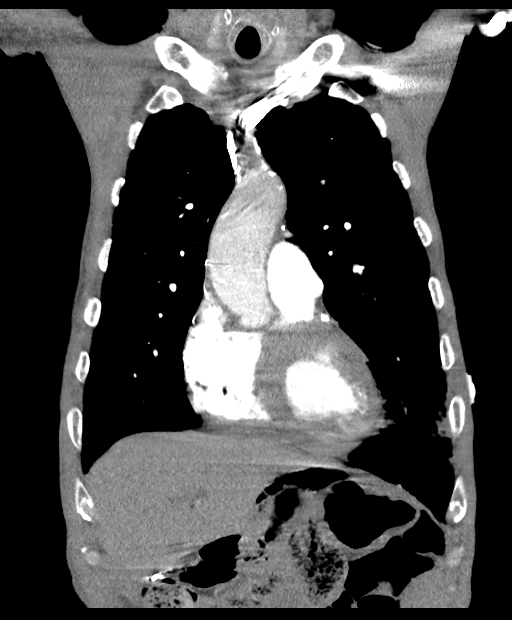
[im 37/73  soft-tissue]
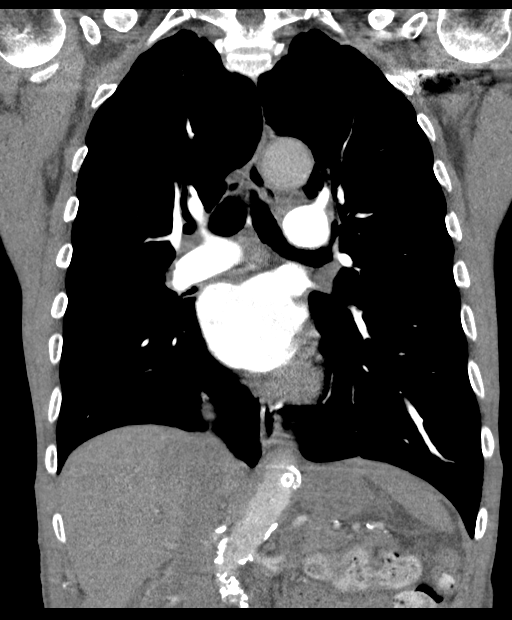
[im 55/73  soft-tissue]
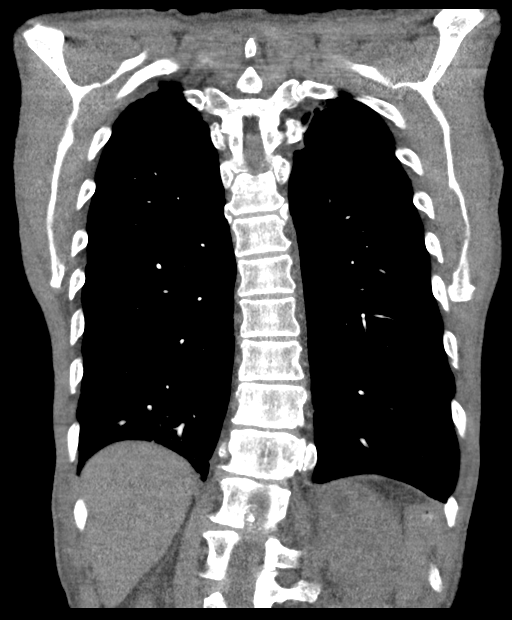

[18 of 46 positions shown; findings below may reference images not displayed]

FINDINGS: Cardiovascular: Satisfactory opacification of the pulmonary arteries
to the segmental level. No evidence of pulmonary embolism.
Epicardial leads are noted. Post CABG changes. No pericardial
effusion. Coronary vascular calcification. Moderate aortic
atherosclerosis. Aorta is nonaneurysmal.

Mediastinum/Nodes: Midline trachea. No suspicious lymph nodes.
Esophagus within normal limits.

Lungs/Pleura: Emphysema. New ground-glass density in the lingula,
left upper lobe and patchy ground-glass density in the right lower
lobe suspicious for pneumonia. Aeration in the right lung base is
improved. 10 mm average diameter slightly spiculated nodule in the
right lower lobe, series 7, image 89. Scattered areas of mucous
plugging. Bilateral bronchial wall thickening.

Upper Abdomen: No acute abnormality.

Musculoskeletal: Post sternotomy changes. No acute osseous
abnormality

Review of the MIP images confirms the above findings.
IMPRESSION: 1. Negative for acute pulmonary embolus.
2. Emphysema. New ground-glass density in the lingula and left upper
lobe with mild ground-glass density in the right lower lobe,
findings are suspicious for bilateral pneumonia.
3. 10 mm slightly spiculated appearing nodule at the right lung
base. Consider one of the following in 3 months for both low-risk
and high-risk individuals: (a) repeat chest CT, (b) follow-up
PET-CT, or (c) tissue sampling. This recommendation follows the
consensus statement: Guidelines for Management of Incidental
Pulmonary Nodules Detected on CT Images: From the [HOSPITAL]

Aortic Atherosclerosis (YVBG9-2FL.L) and Emphysema (YVBG9-1TG.L).

## 2023-09-23 ENCOUNTER — Encounter: Payer: Self-pay | Admitting: Oncology

## 2023-09-23 ENCOUNTER — Other Ambulatory Visit: Payer: Self-pay | Admitting: Oncology

## 2023-09-23 ENCOUNTER — Inpatient Hospital Stay: Payer: Medicare Other | Attending: Oncology

## 2023-09-23 ENCOUNTER — Inpatient Hospital Stay (HOSPITAL_BASED_OUTPATIENT_CLINIC_OR_DEPARTMENT_OTHER): Payer: Medicare Other | Admitting: Oncology

## 2023-09-23 VITALS — BP 135/65 | HR 74 | Temp 97.2°F | Resp 18 | Wt 114.0 lb

## 2023-09-23 DIAGNOSIS — Z720 Tobacco use: Secondary | ICD-10-CM | POA: Diagnosis not present

## 2023-09-23 DIAGNOSIS — D472 Monoclonal gammopathy: Secondary | ICD-10-CM

## 2023-09-23 DIAGNOSIS — R109 Unspecified abdominal pain: Secondary | ICD-10-CM

## 2023-09-23 DIAGNOSIS — F17210 Nicotine dependence, cigarettes, uncomplicated: Secondary | ICD-10-CM | POA: Insufficient documentation

## 2023-09-23 DIAGNOSIS — D649 Anemia, unspecified: Secondary | ICD-10-CM

## 2023-09-23 DIAGNOSIS — Z801 Family history of malignant neoplasm of trachea, bronchus and lung: Secondary | ICD-10-CM | POA: Diagnosis not present

## 2023-09-23 DIAGNOSIS — Z85820 Personal history of malignant melanoma of skin: Secondary | ICD-10-CM | POA: Insufficient documentation

## 2023-09-23 DIAGNOSIS — N183 Chronic kidney disease, stage 3 unspecified: Secondary | ICD-10-CM | POA: Insufficient documentation

## 2023-09-23 LAB — CBC WITH DIFFERENTIAL/PLATELET
Abs Immature Granulocytes: 0.08 K/uL — ABNORMAL HIGH (ref 0.00–0.07)
Basophils Absolute: 0 K/uL (ref 0.0–0.1)
Basophils Relative: 0 %
Eosinophils Absolute: 0.2 K/uL (ref 0.0–0.5)
Eosinophils Relative: 1 %
HCT: 31.7 % — ABNORMAL LOW (ref 39.0–52.0)
Hemoglobin: 10.4 g/dL — ABNORMAL LOW (ref 13.0–17.0)
Immature Granulocytes: 1 %
Lymphocytes Relative: 9 %
Lymphs Abs: 1.4 K/uL (ref 0.7–4.0)
MCH: 29 pg (ref 26.0–34.0)
MCHC: 32.8 g/dL (ref 30.0–36.0)
MCV: 88.3 fL (ref 80.0–100.0)
Monocytes Absolute: 1.1 K/uL — ABNORMAL HIGH (ref 0.1–1.0)
Monocytes Relative: 7 %
Neutro Abs: 12 K/uL — ABNORMAL HIGH (ref 1.7–7.7)
Neutrophils Relative %: 82 %
Platelets: 328 K/uL (ref 150–400)
RBC: 3.59 MIL/uL — ABNORMAL LOW (ref 4.22–5.81)
RDW: 14.3 % (ref 11.5–15.5)
WBC: 14.7 K/uL — ABNORMAL HIGH (ref 4.0–10.5)
nRBC: 0 % (ref 0.0–0.2)

## 2023-09-23 LAB — COMPREHENSIVE METABOLIC PANEL WITH GFR
ALT: 22 U/L (ref 0–44)
AST: 32 U/L (ref 15–41)
Albumin: 3.9 g/dL (ref 3.5–5.0)
Alkaline Phosphatase: 80 U/L (ref 38–126)
Anion gap: 7 (ref 5–15)
BUN: 27 mg/dL — ABNORMAL HIGH (ref 8–23)
CO2: 23 mmol/L (ref 22–32)
Calcium: 9.2 mg/dL (ref 8.9–10.3)
Chloride: 98 mmol/L (ref 98–111)
Creatinine, Ser: 1.33 mg/dL — ABNORMAL HIGH (ref 0.61–1.24)
GFR, Estimated: 55 mL/min — ABNORMAL LOW (ref >60)
Glucose, Bld: 89 mg/dL (ref 70–99)
Potassium: 4.7 mmol/L (ref 3.5–5.1)
Sodium: 128 mmol/L — ABNORMAL LOW (ref 135–145)
Total Bilirubin: 0.6 mg/dL (ref 0.0–1.2)
Total Protein: 6.9 g/dL (ref 6.5–8.1)

## 2023-09-23 LAB — TSH: TSH: 1.204 u[IU]/mL (ref 0.350–4.500)

## 2023-09-23 NOTE — Progress Notes (Signed)
 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 Lab Results  Component Value Date   MPROTEIN 0.5 (H) 03/26/2023   KPAFRELGTCHN 20.4 (H) 03/26/2023   LAMBDASER 31.0 (H) 03/26/2023   KAPLAMBRATIO 0.66 03/26/2023     Today's MGUS labs are pending.  Recommend observation, repeat labs in 6 months.   Anemia Hemoglobin has improved.  Lab Results  Component Value Date   HGB 10.4 (L) 09/23/2023   TIBC 277 03/26/2023   IRONPCTSAT 32 03/26/2023   FERRITIN 93 03/26/2023     Iron panel is not consistent with iron deficiency. Normal B12 Folate level.   Tobacco abuse Recommend smoke cessation.   Abdominal pain He declines any work up including CT scan.  He plans to discuss with PCP if symptoms persist.   CKD (chronic kidney disease) stage 3, GFR 30-59 ml/min (HCC) Encourage oral hydration and avoid nephrotoxins.     Orders Placed This Encounter  Procedures   CBC with Differential (Cancer Center Only)    Standing Status:   Future    Expected Date:   03/25/2024    Expiration Date:   06/23/2024   CMP (Cancer Center only)    Standing Status:   Future    Expected Date:   03/25/2024    Expiration Date:   06/23/2024   Kappa/lambda light chains    Standing Status:   Future    Expected Date:   03/25/2024    Expiration Date:   06/23/2024   Multiple Myeloma Panel (SPEP&IFE w/QIG)    Standing Status:   Future    Expected Date:   03/25/2024    Expiration Date:   06/23/2024   Follow up in 6 months.  All questions were answered. The patient knows to call the clinic with any problems, questions or concerns.  Zelphia Cap, MD, PhD [facility] Hematology Oncology 09/23/2023   HISTORY OF PRESENTING ILLNESS:   Todd Whitehead is a  78 y.o.  male presents for  follow up  Patient was accompanied by her friend Ms. Williams. 8/31 2022, CBC showed a hemoglobin of 10.3, hematocrit 29.5, WBC 5.7  10/20/2020, iron panel showed decreased iron saturation of 12, ferritin 140, TIBC 265.  Patient reports feeling fatigued and tired.  Denies any hematemesis or hematochezia. Patient has chronic abdominal pain, and only is able to eat 1 meal per day.  Patient has significant weight loss as well as some nausea.  07/06/2020 CT abdomen pelvis showed no acute findings.  No explanation of patient's unintended weight loss.  Mucous plugging seen within the right lower lobe bronchi with minimal right base atelectasis or early infiltrate. Patient follows up with gastroenterology Dr.Tahiliani, has also been seen by surgery to [facility].  Surgical intervention was not recommended.   09/13/2020, CT abdomen pelvis with contrast at [facility] showed extensive atherosclerotic disease.  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  Previous cholecystectomy.  Prominence, bile duct.  Cyst of both kidneys.  Negative for hydronephrosis.  Moderate distention urinary bladder.  Questionable bladder outlet obstruction.  Prostate gland is mildly enlarged and heterogeneous. Patient was last seen by Dr. Janalyn on 10/20/2020.  GI recommends EGD and colonoscopy for further evaluation of his symptoms. Patient was seen by vascular surgery Dr.Schnier on 10/24/2020.  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 11/08/2020, aortogram  showed a diffusely diseased no hemodynamically significant lesions identified.  SMA was widely patent.  Celiac demonstrates greater than 90% stenosis at its origin but distal to the stenosis appears to be widely patent.  Patient will be rescheduled with plan of upper extremity access which will allow leverage enabling treatment of the lesion and revascularization and stent placement.  Patient was referred to establish care with hematology for evaluation of anemia.  01/26/2021 -  01/30/2021, patient was hospitalized due to hemoptysis, acute on chronic systolic CHF, community-acquired pneumonia, acute hypoxic respiratory failure.  2D echo showed EF 45-50%, grade 1 diastolic dysfunction.  Patient follows up with pulmonology for a suspicious nodule in his lung.  04/27/2021, CT chest without contrast showed interval resolution of the previously seen spiculated nodule of the dependent right lung base.  As well as bilateral airspace disease.  Consistent with resolution of infection or inflammation.  Severe emphysema, diffuse bronchial wall thickening.  CAD.  03/15/2023 - 03/19/2023 patient was hospitalized due to community-acquired pneumonia.  Patient has finished antibiotics course.  Symptoms have resolved. # History of melanoma  INTERVAL HISTORY Todd Whitehead is a 78 y.o. male who has above history reviewed by me today presents for follow up of MGUS, anemia No fever, chills, nausea, vomiting.  He has decreased alcohol drinking, only socially.  He has been experiencing a constant ache around his navel for approximately one week. The pain is not influenced by specific activities or positions and does not improve with bowel movements. No associated nausea, vomiting, or changes in bowel habits. He has bowel movements every other day without blood.  He recalls similar pain in the past related to his gallbladder, which was removed, and has not experienced such pain since the surgery until now. No acid reflux symptoms.   Review of Systems  Constitutional:  Positive for fatigue. Negative for appetite change, chills, diaphoresis, fever and unexpected weight change.  HENT:   Negative for hearing loss, lump/mass, nosebleeds and sore throat.   Eyes:  Negative for eye problems and icterus.  Respiratory:  Negative for chest tightness, cough, hemoptysis, shortness of breath and wheezing.   Cardiovascular:  Negative for chest pain and leg swelling.  Gastrointestinal:  Positive for abdominal  pain. Negative for abdominal distention, blood in stool, diarrhea, nausea and rectal pain.  Endocrine: Negative for hot flashes.  Genitourinary:  Negative for bladder incontinence, difficulty urinating, dysuria, frequency, hematuria and nocturia.   Musculoskeletal:  Negative for back pain, flank pain, gait problem and myalgias.  Skin:  Negative for rash.  Neurological:  Negative for dizziness, gait problem, headaches, numbness and seizures.  Hematological:  Negative for adenopathy. Does not bruise/bleed easily.  Psychiatric/Behavioral:  Negative for confusion and decreased concentration. The patient is not nervous/anxious.     MEDICAL HISTORY:  Past Medical History:  Diagnosis Date   Anxiety    Arthritis    CHF (congestive heart failure) (HCC)    Colon polyp    COPD (chronic obstructive pulmonary disease) (HCC)    smoker   Dysrhythmia    Glaucoma    Heart murmur    Hypertension    on  no meds   Low serum vitamin B12 11/18/2020   Melanoma (HCC)    Resected from Left upper arm.    Myocardial infarction [facility]) 2017   Peripheral vascular disease [facility])    Prostate enlargement     SURGICAL HISTORY: Past Surgical History:  Procedure Laterality Date   BACK SURGERY  01/19/98   CARDIAC  CATHETERIZATION N/A 08/19/2015   Procedure: Left Heart Cath and Coronary Angiography;  Surgeon: Vinie DELENA Jude, MD;  Location: [facility] INVASIVE CV LAB;  Service: Cardiovascular;  Laterality: N/A;   CHOLECYSTECTOMY N/A 08/19/2016   Procedure: LAPAROSCOPIC CHOLECYSTECTOMY WITH INTRAOPERATIVE CHOLANGIOGRAM;  Surgeon: Aron Shoulders, MD;  Location: [facility] OR;  Service: General;  Laterality: N/A;   COLON SURGERY  age 87 days old    bowel was twisted up   COLONOSCOPY WITH PROPOFOL  N/A 03/19/2016   Procedure: COLONOSCOPY WITH PROPOFOL ;  Surgeon: Gladis RAYMOND Mariner, MD;  Location[facility] ENDOSCOPY;  Service: Endoscopy;  Laterality: N/A;   CORONARY ARTERY BYPASS GRAFT  2017   ERCP N/A 08/18/2016   Procedure: ENDOSCOPIC  RETROGRADE CHOLANGIOPANCREATOGRAPHY (ERCP);  Surgeon: Saintclair Jasper, MD;  Location: [facility] ENDOSCOPY;  Service: Gastroenterology;  Laterality: N/A;   ESOPHAGOGASTRODUODENOSCOPY (EGD) WITH PROPOFOL  N/A 09/18/2018   Procedure: ESOPHAGOGASTRODUODENOSCOPY (EGD) WITH PROPOFOL ;  Surgeon: Tye Millet, DO;  Location: [facility] ENDOSCOPY;  Service: General;  Laterality: N/A;   EYE SURGERY Left 03/26/11   FIBEROPTIC BRONCHOSCOPY Bilateral 03/18/2023   Procedure: BEDSIDE BRONCHOSCOPY FIBEROPTIC;  Surgeon: Parris Manna, MD;  Location: [facility] ORS;  Service: Thoracic;  Laterality: Bilateral;   FLEXIBLE BRONCHOSCOPY Bilateral 03/18/2023   Procedure: FLEXIBLE BRONCHOSCOPY;  Surgeon: Parris Manna, MD;  Location: [facility] ORS;  Service: Thoracic;  Laterality: Bilateral;   LOWER EXTREMITY ANGIOGRAPHY Left 10/03/2017   Procedure: LOWER EXTREMITY ANGIOGRAPHY;  Surgeon: Marea Selinda RAMAN, MD;  Location: [facility] INVASIVE CV LAB;  Service: Cardiovascular;  Laterality: Left;   LOWER EXTREMITY ANGIOGRAPHY Right 10/31/2017   Procedure: LOWER EXTREMITY ANGIOGRAPHY;  Surgeon: Marea Selinda RAMAN, MD;  Location: [facility] INVASIVE CV LAB;  Service: Cardiovascular;  Laterality: Right;   LUMBAR LAMINECTOMY/DECOMPRESSION MICRODISCECTOMY Right 10/02/2012   Procedure: Right Lumbar Five-Sacral One Lumbar laminotomy/Microdiskectomy;  Surgeon: Lamar LELON Peaches, MD;  Location: [facility] NEURO ORS;  Service: Neurosurgery;  Laterality: Right;  Right Lumbar Five-Sacral One Lumbar laminotomy/Microdiskectomy   VISCERAL ANGIOGRAPHY N/A 11/08/2020   Procedure: VISCERAL ANGIOGRAPHY;  Surgeon: Jama Cordella MATSU, MD;  Location: [facility] INVASIVE CV LAB;  Service: Cardiovascular;  Laterality: N/A;   VISCERAL ANGIOGRAPHY N/A 11/25/2020   Procedure: VISCERAL ANGIOGRAPHY;  Surgeon: Jama Cordella MATSU, MD;  Location: [facility] INVASIVE CV LAB;  Service: Cardiovascular;  Laterality: N/A;    SOCIAL HISTORY: Social History   Socioeconomic History   Marital status: Divorced    Spouse name: Not on  file   Number of children: Not on file   Years of education: Not on file   Highest education level: Not on file  Occupational History   Not on file  Tobacco Use   Smoking status: Every Day    Current packs/day: 1.00    Average packs/day: 1 pack/day for 58.0 years (58.0 ttl pk-yrs)    Types: Cigarettes   Smokeless tobacco: Never  Vaping Use   Vaping status: Former  Substance and Sexual Activity   Alcohol use: No   Drug use: No   Sexual activity: Not Currently  Other Topics Concern   Not on file  Social History Narrative   Not on file   Social Drivers of Health   Financial Resource Strain: Low Risk  (04/05/2023)   Received [facility]   Overall Financial Resource Strain (CARDIA)    Difficulty of Paying Living Expenses: Not very hard  Food Insecurity: No Food Insecurity (04/05/2023)   Received from [facility]   Hunger Vital Sign    Within the past 12 months, you  worried that your food would run out before you got the money to buy more.: Never true    Within the past 12 months, the food you bought just didn't last and you didn't have money to get more.: Never true  Transportation Needs: No Transportation Needs (04/05/2023)   Received from [facility] - Transportation    In the past 12 months, has lack of transportation kept you from medical appointments or from getting medications?: No    Lack of Transportation (Non-Medical): No  Physical Activity: Not on file  Stress: Not on file  Social Connections: Moderately Integrated (03/16/2023)   Social Connection and Isolation Panel    Frequency of Communication with Friends and Family: Three times a week    Frequency of Social Gatherings with Friends and Family: Once a week    Attends Religious Services: More than 4 times per year    Active Member of [employer] or Organizations: Yes    Attends Banker Meetings: Never    Marital Status: Divorced  Careers information officer Violence: Not At Risk (03/16/2023)   Humiliation, Afraid, Rape, and Kick questionnaire    Fear of Current or Ex-Partner: No    Emotionally Abused: No    Physically Abused: No    Sexually Abused: No    FAMILY HISTORY: Family History  Problem Relation Age of Onset   Heart attack Father    Aneurysm Brother    Lung cancer Brother     ALLERGIES:  is allergic to other, poison ivy extract, and poison oak extract.  MEDICATIONS:  Current Outpatient Medications  Medication Sig Dispense Refill   acetaminophen  (TYLENOL ) 500 MG tablet Take 1 tablet (500 mg total) by mouth every 6 (six) hours as needed. 30 tablet 0   aspirin  EC 81 MG tablet Take 81 mg by mouth daily. Swallow whole.     atorvastatin  (LIPITOR) 40 MG tablet Take 40 mg by mouth daily.      brimonidine  (ALPHAGAN ) 0.15 % ophthalmic solution Place 1 drop into both eyes 2 (two) times daily.     busPIRone  (BUSPAR ) 30 MG tablet Take 30 mg by mouth 2 (two) times daily.     citalopram  (CELEXA ) 20 MG tablet Take 20 mg by mouth daily.     clopidogrel  (PLAVIX ) 75 MG tablet TAKE 1 TABLET(75 MG) BY MOUTH DAILY 30 tablet 5   dapagliflozin  propanediol (FARXIGA ) 10 MG TABS tablet Take 1 tablet (10 mg total) by mouth daily before breakfast. 90 tablet 3   ferrous sulfate  325 (65 FE) MG EC tablet TAKE 1 TABLET(325 MG) BY MOUTH TWICE DAILY WITH A MEAL 180 tablet 1   latanoprost  (XALATAN ) 0.005 % ophthalmic solution Place 1 drop into both eyes at bedtime.   2   metoprolol  succinate (TOPROL -XL) 25 MG 24 hr tablet Take 12.5 mg by mouth daily.     Multiple Vitamin (MULTIVITAMIN WITH MINERALS) TABS tablet Take 1 tablet by mouth daily. 30 tablet 0   Multiple Vitamins-Minerals (CENTRUM ADULT 50+ MULTIGUMMIES PO) Take 1 tablet by mouth daily.     sacubitril -valsartan  (ENTRESTO ) 24-26 MG Take 1 tablet by mouth 2 (two) times daily. 180 tablet 3   tamsulosin  (FLOMAX ) 0.4 MG CAPS capsule Take 1 capsule (0.4 mg total) by mouth daily after breakfast. 30  capsule 2   traZODone  (DESYREL ) 50 MG tablet Take 50 mg by mouth at bedtime.     No current facility-administered medications for this visit.     PHYSICAL EXAMINATION:  ECOG PERFORMANCE STATUS: 2 - Symptomatic, <50% confined to bed Vitals:   09/23/23 1018  BP: 135/65  Pulse: 74  Resp: 18  Temp: (!) 97.2 F (36.2 C)  SpO2: 94%   Filed Weights   09/23/23 1018  Weight: 114 lb (51.7 kg)     Physical Exam Constitutional:      General: He is not in acute distress.    Comments: Patient ambulates independently  Eyes:     General: No scleral icterus. Cardiovascular:     Rate and Rhythm: Normal rate and regular rhythm.  Pulmonary:     Effort: Pulmonary effort is normal.     Comments: Decreased breath sound bilaterally. Abdominal:     General: Bowel sounds are normal. There is no distension.     Palpations: Abdomen is soft.  Musculoskeletal:        General: No deformity. Normal range of motion.     Cervical back: Normal range of motion.  Skin:    General: Skin is warm and dry.  Neurological:     Mental Status: He is alert and oriented to person, place, and time. Mental status is at baseline.     Cranial Nerves: No cranial nerve deficit.  Psychiatric:        Mood and Affect: Mood normal.     LABORATORY DATA:  I have reviewed the data as listed    Latest Ref Rng & Units 09/23/2023   10:04 AM 03/26/2023   11:03 AM 03/19/2023    5:31 AM  CBC  WBC 4.0 - 10.5 K/uL 14.7  12.7  10.8   Hemoglobin 13.0 - 17.0 g/dL 89.5  9.9  9.6   Hematocrit 39.0 - 52.0 % 31.7  29.5  27.8   Platelets 150 - 400 K/uL 328  298  195       Latest Ref Rng & Units 09/23/2023   10:04 AM 07/02/2023    9:02 AM 06/18/2023    9:07 AM  CMP  Glucose 70 - 99 mg/dL 89  890  87   BUN 8 - 23 mg/dL 27  28  21    Creatinine 0.61 - 1.24 mg/dL 8.66  8.30  8.43   Sodium 135 - 145 mmol/L 128  136  138   Potassium 3.5 - 5.1 mmol/L 4.7  4.9  5.2   Chloride 98 - 111 mmol/L 98  102  100   CO2 22 - 32 mmol/L 23  20   21    Calcium  8.9 - 10.3 mg/dL 9.2  9.5  9.6   Total Protein 6.5 - 8.1 g/dL 6.9     Total Bilirubin 0.0 - 1.2 mg/dL 0.6     Alkaline Phos 38 - 126 U/L 80     AST 15 - 41 U/L 32     ALT 0 - 44 U/L 22       RADIOGRAPHIC STUDIES: I have personally reviewed the radiological images as listed and agreed with the findings in the report. No results found.

## 2023-09-23 NOTE — Assessment & Plan Note (Signed)
 He declines any work up including CT scan.  He plans to discuss with PCP if symptoms persist.

## 2023-09-23 NOTE — Assessment & Plan Note (Signed)
 Recommend smoke cessation.

## 2023-09-23 NOTE — Assessment & Plan Note (Signed)
 IgG kappa MGUS Labs are reviewed and discussed with patient. Lab Results  Component Value Date   MPROTEIN 0.5 (H) 03/26/2023   KPAFRELGTCHN 20.4 (H) 03/26/2023   LAMBDASER 31.0 (H) 03/26/2023   KAPLAMBRATIO 0.66 03/26/2023     Today's MGUS labs are pending.  Recommend observation, repeat labs in 6 months.

## 2023-09-23 NOTE — Assessment & Plan Note (Signed)
 Hemoglobin has improved.  Lab Results  Component Value Date   HGB 10.4 (L) 09/23/2023   TIBC 277 03/26/2023   IRONPCTSAT 32 03/26/2023   FERRITIN 93 03/26/2023     Iron panel is not consistent with iron deficiency. Normal B12 Folate level.

## 2023-09-23 NOTE — Assessment & Plan Note (Signed)
 Encourage oral hydration and avoid nephrotoxins.

## 2023-09-24 LAB — KAPPA/LAMBDA LIGHT CHAINS
Kappa free light chain: 36.8 mg/L — ABNORMAL HIGH (ref 3.3–19.4)
Kappa, lambda light chain ratio: 0.86 (ref 0.26–1.65)
Lambda free light chains: 42.7 mg/L — ABNORMAL HIGH (ref 5.7–26.3)

## 2023-09-25 LAB — MULTIPLE MYELOMA PANEL, SERUM
Albumin SerPl Elph-Mcnc: 3.6 g/dL (ref 2.9–4.4)
Albumin/Glob SerPl: 1.3 (ref 0.7–1.7)
Alpha 1: 0.3 g/dL (ref 0.0–0.4)
Alpha2 Glob SerPl Elph-Mcnc: 0.7 g/dL (ref 0.4–1.0)
B-Globulin SerPl Elph-Mcnc: 0.9 g/dL (ref 0.7–1.3)
Gamma Glob SerPl Elph-Mcnc: 0.9 g/dL (ref 0.4–1.8)
Globulin, Total: 2.8 g/dL (ref 2.2–3.9)
IgA: 131 mg/dL (ref 61–437)
IgG (Immunoglobin G), Serum: 1030 mg/dL (ref 603–1613)
IgM (Immunoglobulin M), Srm: 60 mg/dL (ref 15–143)
Total Protein ELP: 6.4 g/dL (ref 6.0–8.5)

## 2023-09-27 ENCOUNTER — Telehealth: Payer: Self-pay | Admitting: Family

## 2023-09-27 NOTE — Telephone Encounter (Signed)
 Called to confirm/remind patient of [facility] on 10[facility].   Appointment:   [x] Confirmed  [] Left mess   [] No answer/No voice mail  [] VM Full/unable to leave message  [] Phone not in service  Patient reminded to bring all medications and/or complete list.  Confirmed patient has transportation. Gave directions, instructed to utilize valet parking.

## 2023-09-29 NOTE — Progress Notes (Unsigned)
 Advanced Heart Failure Clinic Note    PCP: Lenon Layman ORN, MD (last seen 04/25) Cardiologist: Florencio Kava, MD (last seen 12/24; returns 06/25)  Chief Complaint: shortness of breath   HPI:  Todd Whitehead is a 78 y/o male with a history of HTN, anxiety, depression, COPD, hyperlipidemia, CAD (MI), CABG, anemia, MGUS, CKD, PVD, DM, carotid artery stenosis, current tobacco use and chronic heart failure.   LHC done 08/19/15 and showed: Mid LAD lesion, 80% stenosed. Mid Cx lesion, 85% stenosed. Mid RCA lesion, 100% stenosed. LM lesion, 50% stenosed. 3 vessel cad. Mildly reduced lv function  Echo 2/30/22: EF 45 to 50%, global hypokinesis, mild left ventricular hypertrophy, G1DD  Admitted 03/15/23 with dyspnea, wheezing, and hemoptysis. CTA negative for PE. Pulmonology consulted. Bronchoscopy with lavage and mucus removal. Hemoptysis thought to be due to pneumonia. Given antibiotics, steroids, oxygen with improvement of symptoms. Palliative consult obtained  Seen in HF clinic 04/25 and entresto  49/51mg  was resumed.   Echo 06/10/23: 55-60%, mild LVH, normal RV, moderate LAE, mild/ mod Todd  Seen in [facility] 05/25 and entresto  decreased to 24/26mg  BID and spiro 12.5mg  daily was started. After lab results obtained, patient told to hold spiro due to K+ of 5.2.  He presents today for a HF follow-up visit with a chief complaint of moderate shortness of breath. Was short of breath upon walking into the office but resolves quickly upon rest. Has associated fatigue. Denies chest pain, palpitations, abdominal distention, pedal edema, dizziness, weight gain or difficulty sleeping. Currently holding spironolactone  due to elevated K+.   ROS: All systems negative except what is listed in HPI, PMH and Problem List   Past Medical History:  Diagnosis Date   Anxiety    Arthritis    CHF (congestive heart failure) (HCC)    Colon polyp    COPD (chronic obstructive pulmonary disease) (HCC)    smoker    Dysrhythmia    Glaucoma    Heart murmur    Hypertension    on  no meds   Low serum vitamin B12 11/18/2020   Melanoma (HCC)    Resected from Left upper arm.    Myocardial infarction [facility]) 2017   Peripheral vascular disease (HCC)    Prostate enlargement     Current Outpatient Medications  Medication Sig Dispense Refill   acetaminophen  (TYLENOL ) 500 MG tablet Take 1 tablet (500 mg total) by mouth every 6 (six) hours as needed. 30 tablet 0   aspirin  EC 81 MG tablet Take 81 mg by mouth daily. Swallow whole.     atorvastatin  (LIPITOR) 40 MG tablet Take 40 mg by mouth daily.      brimonidine  (ALPHAGAN ) 0.15 % ophthalmic solution Place 1 drop into both eyes 2 (two) times daily.     busPIRone  (BUSPAR ) 30 MG tablet Take 30 mg by mouth 2 (two) times daily.     citalopram  (CELEXA ) 20 MG tablet Take 20 mg by mouth daily.     clopidogrel  (PLAVIX ) 75 MG tablet TAKE 1 TABLET(75 MG) BY MOUTH DAILY 30 tablet 5   dapagliflozin  propanediol (FARXIGA ) 10 MG TABS tablet Take 1 tablet (10 mg total) by mouth daily before breakfast. 90 tablet 3   ferrous sulfate  325 (65 FE) MG EC tablet TAKE 1 TABLET(325 MG) BY MOUTH TWICE DAILY WITH A MEAL 180 tablet 1   latanoprost  (XALATAN ) 0.005 % ophthalmic solution Place 1 drop into both eyes at bedtime.   2   metoprolol  succinate (TOPROL -XL) 25 MG 24 hr tablet Take  12.5 mg by mouth daily.     Multiple Vitamin (MULTIVITAMIN WITH MINERALS) TABS tablet Take 1 tablet by mouth daily. 30 tablet 0   Multiple Vitamins-Minerals (CENTRUM ADULT 50+ MULTIGUMMIES PO) Take 1 tablet by mouth daily.     sacubitril -valsartan  (ENTRESTO ) 24-26 MG Take 1 tablet by mouth 2 (two) times daily. 180 tablet 3   tamsulosin  (FLOMAX ) 0.4 MG CAPS capsule Take 1 capsule (0.4 mg total) by mouth daily after breakfast. 30 capsule 2   traZODone  (DESYREL ) 50 MG tablet Take 50 mg by mouth at bedtime.     No current facility-administered medications for this visit.    Allergies  Allergen Reactions    Other Nausea And Vomiting    Cheese,butter,sour cream,cottage cheese   Poison Ivy Extract Rash   Poison Oak Extract Rash      Social History   Socioeconomic History   Marital status: Divorced    Spouse name: Not on file   Number of children: Not on file   Years of education: Not on file   Highest education level: Not on file  Occupational History   Not on file  Tobacco Use   Smoking status: Every Day    Current packs/day: 1.00    Average packs/day: 1 pack/day for 58.0 years (58.0 ttl pk-yrs)    Types: Cigarettes   Smokeless tobacco: Never  Vaping Use   Vaping status: Former  Substance and Sexual Activity   Alcohol use: No   Drug use: No   Sexual activity: Not Currently  Other Topics Concern   Not on file  Social History Narrative   Not on file   Social Drivers of Health   Financial Resource Strain: Low Risk  (04/05/2023)   Received from [facility]   Overall Financial Resource Strain (CARDIA)    Difficulty of Paying Living Expenses: Not very hard  Food Insecurity: No Food Insecurity (04/05/2023)   Received from [facility]   Hunger Vital Sign    Within the past 12 months, you worried that your food would run out before you got the money to buy more.: Never true    Within the past 12 months, the food you bought just didn't last and you didn't have money to get more.: Never true  Transportation Needs: No Transportation Needs (04/05/2023)   Received from [facility] - Transportation    In the past 12 months, has lack of transportation kept you from medical appointments or from getting medications?: No    Lack of Transportation (Non-Medical): No  Physical Activity: Not on file  Stress: Not on file  Social Connections: Moderately Integrated (03/16/2023)   Social Connection and Isolation Panel    Frequency of Communication with Friends and Family: Three times a week    Frequency of Social Gatherings with Friends  and Family: Once a week    Attends Religious Services: More than 4 times per year    Active Member of [employer] or Organizations: Yes    Attends Banker Meetings: Never    Marital Status: Divorced  Catering manager Violence: Not At Risk (03/16/2023)   Humiliation, Afraid, Rape, and Kick questionnaire    Fear of Current or Ex-Partner: No    Emotionally Abused: No    Physically Abused: No    Sexually Abused: No      Family History  Problem Relation Age of Onset   Heart attack Father    Aneurysm Brother  Lung cancer Brother    There were no vitals filed for this visit.  Wt Readings from Last 3 Encounters:  09/23/23 114 lb (51.7 kg)  07/02/23 112 lb 6.4 oz (51 kg)  06/18/23 114 lb 3.2 oz (51.8 kg)   Lab Results  Component Value Date   CREATININE 1.33 (H) 09/23/2023   CREATININE 1.69 (H) 07/02/2023   CREATININE 1.56 (H) 06/18/2023   PHYSICAL EXAM:  General: Thin appearing. No resp difficulty HEENT: normal Neck: supple, no JVD Cor: Regular rhythm, rate. No rubs, gallops or murmurs Lungs: clear Abdomen: soft, nontender, nondistended. Extremities: no cyanosis, clubbing, rash, edema Neuro: alert & oriented X 3. Moves all 4 extremities w/o difficulty. Affect pleasant   ECG: not done   ASSESSMENT & PLAN:  1: Chronic heart failure with preserved ejection fraction- - suspect due to severe COPD - NYHA class III - euvolemic today - weighing daily; reminded to call for an overnight weight gain of > 2 pounds or a weekly weight gain of > 5 pounds - weight down 2 pounds from last visit here 2 weeks ago - Echo 2/30/22: EF 45 to 50%, global hypokinesis, mild left ventricular hypertrophy, G1DD - Echo 06/10/23: 55-60%, mild LVH, normal RV, moderate LAE, mild/ mod Todd (reviewed results w/ patient) - continue farxiga  10mg  daily - continue metoprolol  succinate 12.5mg  daily - continue entresto  24/26 mg BID; currently breaking 49/51mg  tablets in half to use up his supply -  spironolactone  currently on hold due to hyperkalemia - BMET today & if able to start spiro, will call patient - BNP 08/03/21 reviewed and was 152.5  2: HTN- - BP 102/64 - saw PCP Todd Whitehead) 04/25 - BMP 06/18/23 reviewed: sodium 138, potassium 5.2, creatinine 1.56 & GFR 45 - BMET today  3: severe COPD- - has oxygen for PRN usage - saw pulmonology Todd Whitehead) 03/25  4: Tobacco use- - smoking 1.0 ppd of cigarettes; at his peak he was smoking 3 ppd - has been smoking since the age of 16 - cessation discussed but he's not interested in stoppage  5: Anemia/ MGUS- - saw hematology Todd Whitehead) 02/25 - hemoglobin 03/26/23 reviewed and was 9.9  6: CAD- - saw cardiology Todd Whitehead) 12/24; returns sometime this month - continue atorvastatin  40mg  daily - continue plavix  75mg  daily - LDL 09/26/22 reviewed and was 25 - LHC done 08/19/15 and showed: Mid LAD lesion, 80% stenosed. Mid Cx lesion, 85% stenosed. Mid RCA lesion, 100% stenosed. LM lesion, 50% stenosed.    3 vessel cad. Mildly reduced lv function   Return in 3 months, sooner if needed.   Ellouise DELENA Class, FNP 09/29/23

## 2023-10-01 ENCOUNTER — Encounter: Payer: Self-pay | Admitting: Family

## 2023-10-01 ENCOUNTER — Ambulatory Visit: Attending: Family | Admitting: Family

## 2023-10-01 VITALS — BP 115/60 | HR 73 | Wt 113.4 lb

## 2023-10-01 DIAGNOSIS — I10 Essential (primary) hypertension: Secondary | ICD-10-CM | POA: Diagnosis not present

## 2023-10-01 DIAGNOSIS — I252 Old myocardial infarction: Secondary | ICD-10-CM | POA: Insufficient documentation

## 2023-10-01 DIAGNOSIS — Z79899 Other long term (current) drug therapy: Secondary | ICD-10-CM | POA: Diagnosis not present

## 2023-10-01 DIAGNOSIS — I2510 Atherosclerotic heart disease of native coronary artery without angina pectoris: Secondary | ICD-10-CM | POA: Diagnosis not present

## 2023-10-01 DIAGNOSIS — D631 Anemia in chronic kidney disease: Secondary | ICD-10-CM | POA: Diagnosis not present

## 2023-10-01 DIAGNOSIS — F419 Anxiety disorder, unspecified: Secondary | ICD-10-CM | POA: Diagnosis not present

## 2023-10-01 DIAGNOSIS — J449 Chronic obstructive pulmonary disease, unspecified: Secondary | ICD-10-CM | POA: Diagnosis not present

## 2023-10-01 DIAGNOSIS — E1122 Type 2 diabetes mellitus with diabetic chronic kidney disease: Secondary | ICD-10-CM | POA: Insufficient documentation

## 2023-10-01 DIAGNOSIS — Z7902 Long term (current) use of antithrombotics/antiplatelets: Secondary | ICD-10-CM | POA: Diagnosis not present

## 2023-10-01 DIAGNOSIS — E1151 Type 2 diabetes mellitus with diabetic peripheral angiopathy without gangrene: Secondary | ICD-10-CM | POA: Diagnosis not present

## 2023-10-01 DIAGNOSIS — N189 Chronic kidney disease, unspecified: Secondary | ICD-10-CM | POA: Diagnosis not present

## 2023-10-01 DIAGNOSIS — F17210 Nicotine dependence, cigarettes, uncomplicated: Secondary | ICD-10-CM | POA: Diagnosis not present

## 2023-10-01 DIAGNOSIS — E785 Hyperlipidemia, unspecified: Secondary | ICD-10-CM | POA: Diagnosis not present

## 2023-10-01 DIAGNOSIS — I739 Peripheral vascular disease, unspecified: Secondary | ICD-10-CM | POA: Insufficient documentation

## 2023-10-01 DIAGNOSIS — D472 Monoclonal gammopathy: Secondary | ICD-10-CM | POA: Insufficient documentation

## 2023-10-01 DIAGNOSIS — Z951 Presence of aortocoronary bypass graft: Secondary | ICD-10-CM | POA: Diagnosis not present

## 2023-10-01 DIAGNOSIS — I130 Hypertensive heart and chronic kidney disease with heart failure and stage 1 through stage 4 chronic kidney disease, or unspecified chronic kidney disease: Secondary | ICD-10-CM | POA: Insufficient documentation

## 2023-10-01 DIAGNOSIS — I5032 Chronic diastolic (congestive) heart failure: Secondary | ICD-10-CM | POA: Insufficient documentation

## 2023-10-01 DIAGNOSIS — D649 Anemia, unspecified: Secondary | ICD-10-CM

## 2023-10-01 DIAGNOSIS — Z720 Tobacco use: Secondary | ICD-10-CM

## 2023-10-01 DIAGNOSIS — F32A Depression, unspecified: Secondary | ICD-10-CM | POA: Insufficient documentation

## 2023-10-01 DIAGNOSIS — Z7984 Long term (current) use of oral hypoglycemic drugs: Secondary | ICD-10-CM | POA: Diagnosis not present

## 2023-10-01 MED ORDER — SACUBITRIL-VALSARTAN 24-26 MG PO TABS: 1.0000 | ORAL_TABLET | Freq: Two times a day (BID) | ORAL | 3 refills | Status: AC

## 2023-10-01 NOTE — Patient Instructions (Signed)
 It was good to see you today!

## 2023-11-05 ENCOUNTER — Emergency Department

## 2023-11-05 ENCOUNTER — Other Ambulatory Visit: Payer: Self-pay

## 2023-11-05 ENCOUNTER — Inpatient Hospital Stay
Admission: EM | Admit: 2023-11-05 | Discharge: 2023-11-09 | DRG: 189 | Disposition: A | Discharge status: Home Health | Attending: Internal Medicine | Admitting: Internal Medicine

## 2023-11-05 DIAGNOSIS — E1151 Type 2 diabetes mellitus with diabetic peripheral angiopathy without gangrene: Secondary | ICD-10-CM | POA: Diagnosis present

## 2023-11-05 DIAGNOSIS — R0603 Acute respiratory distress: Secondary | ICD-10-CM

## 2023-11-05 DIAGNOSIS — Z91011 Allergy to milk products, unspecified: Secondary | ICD-10-CM

## 2023-11-05 DIAGNOSIS — E119 Type 2 diabetes mellitus without complications: Secondary | ICD-10-CM

## 2023-11-05 DIAGNOSIS — I5022 Chronic systolic (congestive) heart failure: Secondary | ICD-10-CM | POA: Diagnosis present

## 2023-11-05 DIAGNOSIS — J9811 Atelectasis: Secondary | ICD-10-CM | POA: Diagnosis not present

## 2023-11-05 DIAGNOSIS — Z1152 Encounter for screening for COVID-19: Secondary | ICD-10-CM

## 2023-11-05 DIAGNOSIS — Z91199 Patient's noncompliance with other medical treatment and regimen due to unspecified reason: Secondary | ICD-10-CM

## 2023-11-05 DIAGNOSIS — Z7982 Long term (current) use of aspirin: Secondary | ICD-10-CM

## 2023-11-05 DIAGNOSIS — J432 Centrilobular emphysema: Secondary | ICD-10-CM | POA: Diagnosis present

## 2023-11-05 DIAGNOSIS — N179 Acute kidney failure, unspecified: Secondary | ICD-10-CM | POA: Diagnosis present

## 2023-11-05 DIAGNOSIS — R0602 Shortness of breath: Secondary | ICD-10-CM

## 2023-11-05 DIAGNOSIS — Z79899 Other long term (current) drug therapy: Secondary | ICD-10-CM

## 2023-11-05 DIAGNOSIS — Z951 Presence of aortocoronary bypass graft: Secondary | ICD-10-CM

## 2023-11-05 DIAGNOSIS — I779 Disorder of arteries and arterioles, unspecified: Secondary | ICD-10-CM | POA: Diagnosis present

## 2023-11-05 DIAGNOSIS — K551 Chronic vascular disorders of intestine: Secondary | ICD-10-CM | POA: Diagnosis present

## 2023-11-05 DIAGNOSIS — I130 Hypertensive heart and chronic kidney disease with heart failure and stage 1 through stage 4 chronic kidney disease, or unspecified chronic kidney disease: Secondary | ICD-10-CM | POA: Diagnosis present

## 2023-11-05 DIAGNOSIS — Z66 Do not resuscitate: Secondary | ICD-10-CM | POA: Diagnosis present

## 2023-11-05 DIAGNOSIS — J441 Chronic obstructive pulmonary disease with (acute) exacerbation: Secondary | ICD-10-CM | POA: Diagnosis not present

## 2023-11-05 DIAGNOSIS — Z860100 Personal history of colon polyps, unspecified: Secondary | ICD-10-CM

## 2023-11-05 DIAGNOSIS — F32A Depression, unspecified: Secondary | ICD-10-CM | POA: Diagnosis present

## 2023-11-05 DIAGNOSIS — D472 Monoclonal gammopathy: Secondary | ICD-10-CM | POA: Diagnosis present

## 2023-11-05 DIAGNOSIS — J440 Chronic obstructive pulmonary disease with acute lower respiratory infection: Secondary | ICD-10-CM | POA: Diagnosis present

## 2023-11-05 DIAGNOSIS — F411 Generalized anxiety disorder: Secondary | ICD-10-CM | POA: Diagnosis present

## 2023-11-05 DIAGNOSIS — Z9049 Acquired absence of other specified parts of digestive tract: Secondary | ICD-10-CM

## 2023-11-05 DIAGNOSIS — I252 Old myocardial infarction: Secondary | ICD-10-CM

## 2023-11-05 DIAGNOSIS — F418 Other specified anxiety disorders: Secondary | ICD-10-CM | POA: Diagnosis present

## 2023-11-05 DIAGNOSIS — I2510 Atherosclerotic heart disease of native coronary artery without angina pectoris: Secondary | ICD-10-CM | POA: Diagnosis present

## 2023-11-05 DIAGNOSIS — Z8249 Family history of ischemic heart disease and other diseases of the circulatory system: Secondary | ICD-10-CM

## 2023-11-05 DIAGNOSIS — R636 Underweight: Secondary | ICD-10-CM | POA: Diagnosis present

## 2023-11-05 DIAGNOSIS — N1831 Chronic kidney disease, stage 3a: Secondary | ICD-10-CM | POA: Diagnosis present

## 2023-11-05 DIAGNOSIS — R911 Solitary pulmonary nodule: Secondary | ICD-10-CM | POA: Diagnosis present

## 2023-11-05 DIAGNOSIS — Z7902 Long term (current) use of antithrombotics/antiplatelets: Secondary | ICD-10-CM

## 2023-11-05 DIAGNOSIS — M1990 Unspecified osteoarthritis, unspecified site: Secondary | ICD-10-CM | POA: Diagnosis present

## 2023-11-05 DIAGNOSIS — Z85820 Personal history of malignant melanoma of skin: Secondary | ICD-10-CM

## 2023-11-05 DIAGNOSIS — J9621 Acute and chronic respiratory failure with hypoxia: Secondary | ICD-10-CM | POA: Diagnosis not present

## 2023-11-05 DIAGNOSIS — F17210 Nicotine dependence, cigarettes, uncomplicated: Secondary | ICD-10-CM | POA: Diagnosis present

## 2023-11-05 DIAGNOSIS — Z7984 Long term (current) use of oral hypoglycemic drugs: Secondary | ICD-10-CM

## 2023-11-05 DIAGNOSIS — F17200 Nicotine dependence, unspecified, uncomplicated: Secondary | ICD-10-CM | POA: Diagnosis present

## 2023-11-05 DIAGNOSIS — I6529 Occlusion and stenosis of unspecified carotid artery: Secondary | ICD-10-CM | POA: Diagnosis present

## 2023-11-05 DIAGNOSIS — J188 Other pneumonia, unspecified organism: Secondary | ICD-10-CM | POA: Diagnosis present

## 2023-11-05 DIAGNOSIS — Z681 Body mass index (BMI) 19 or less, adult: Secondary | ICD-10-CM

## 2023-11-05 DIAGNOSIS — I4729 Other ventricular tachycardia: Secondary | ICD-10-CM

## 2023-11-05 DIAGNOSIS — I739 Peripheral vascular disease, unspecified: Secondary | ICD-10-CM | POA: Diagnosis present

## 2023-11-05 DIAGNOSIS — I10 Essential (primary) hypertension: Secondary | ICD-10-CM | POA: Diagnosis present

## 2023-11-05 DIAGNOSIS — H409 Unspecified glaucoma: Secondary | ICD-10-CM | POA: Diagnosis present

## 2023-11-05 DIAGNOSIS — J189 Pneumonia, unspecified organism: Secondary | ICD-10-CM | POA: Diagnosis present

## 2023-11-05 DIAGNOSIS — I472 Ventricular tachycardia, unspecified: Secondary | ICD-10-CM | POA: Diagnosis present

## 2023-11-05 DIAGNOSIS — E785 Hyperlipidemia, unspecified: Secondary | ICD-10-CM | POA: Diagnosis present

## 2023-11-05 DIAGNOSIS — Z9109 Other allergy status, other than to drugs and biological substances: Secondary | ICD-10-CM

## 2023-11-05 DIAGNOSIS — E1122 Type 2 diabetes mellitus with diabetic chronic kidney disease: Secondary | ICD-10-CM | POA: Diagnosis present

## 2023-11-05 DIAGNOSIS — J9601 Acute respiratory failure with hypoxia: Secondary | ICD-10-CM | POA: Insufficient documentation

## 2023-11-05 DIAGNOSIS — J438 Other emphysema: Secondary | ICD-10-CM | POA: Diagnosis present

## 2023-11-05 DIAGNOSIS — N400 Enlarged prostate without lower urinary tract symptoms: Secondary | ICD-10-CM | POA: Diagnosis present

## 2023-11-05 LAB — BASIC METABOLIC PANEL WITH GFR
Anion gap: 11 (ref 5–15)
BUN: 36 mg/dL — ABNORMAL HIGH (ref 8–23)
CO2: 21 mmol/L — ABNORMAL LOW (ref 22–32)
Calcium: 8.9 mg/dL (ref 8.9–10.3)
Chloride: 101 mmol/L (ref 98–111)
Creatinine, Ser: 1.75 mg/dL — ABNORMAL HIGH (ref 0.61–1.24)
GFR, Estimated: 39 mL/min — ABNORMAL LOW (ref >60)
Glucose, Bld: 153 mg/dL — ABNORMAL HIGH (ref 70–99)
Potassium: 4.3 mmol/L (ref 3.5–5.1)
Sodium: 133 mmol/L — ABNORMAL LOW (ref 135–145)

## 2023-11-05 LAB — CBC
HCT: 28.7 % — ABNORMAL LOW (ref 39.0–52.0)
Hemoglobin: 9.4 g/dL — ABNORMAL LOW (ref 13.0–17.0)
MCH: 29.2 pg (ref 26.0–34.0)
MCHC: 32.8 g/dL (ref 30.0–36.0)
MCV: 89.1 fL (ref 80.0–100.0)
Platelets: 374 K/uL (ref 150–400)
RBC: 3.22 MIL/uL — ABNORMAL LOW (ref 4.22–5.81)
RDW: 14.5 % (ref 11.5–15.5)
WBC: 20.4 K/uL — ABNORMAL HIGH (ref 4.0–10.5)
nRBC: 0 % (ref 0.0–0.2)

## 2023-11-05 LAB — CBG MONITORING, ED: Glucose-Capillary: 177 mg/dL — ABNORMAL HIGH (ref 70–99)

## 2023-11-05 LAB — RESP PANEL BY RT-PCR (RSV, FLU A&B, COVID)  RVPGX2
Influenza A by PCR: NEGATIVE
Influenza B by PCR: NEGATIVE
Resp Syncytial Virus by PCR: NEGATIVE
SARS Coronavirus 2 by RT PCR: NEGATIVE

## 2023-11-05 LAB — TROPONIN I (HIGH SENSITIVITY)
Troponin I (High Sensitivity): 15 ng/L (ref <18)
Troponin I (High Sensitivity): 17 ng/L (ref <18)

## 2023-11-05 LAB — BLOOD GAS, VENOUS: Patient temperature: 37

## 2023-11-05 MED ORDER — ONDANSETRON HCL 4 MG/2ML IJ SOLN: 4.0000 mg | Freq: Four times a day (QID) | INTRAMUSCULAR | Status: DC | PRN

## 2023-11-05 MED ORDER — CITALOPRAM HYDROBROMIDE 20 MG PO TABS
20.0000 mg | ORAL_TABLET | Freq: Every day | ORAL | Status: DC
  Administered 2023-11-06 – 2023-11-09 (×4): 20 mg via ORAL
  Filled 2023-11-05 (×4): qty 1

## 2023-11-05 MED ORDER — AZITHROMYCIN 500 MG IV SOLR
500.0000 mg | Freq: Once | INTRAVENOUS | Status: AC
  Administered 2023-11-05: 500 mg via INTRAVENOUS
  Filled 2023-11-05: qty 5

## 2023-11-05 MED ORDER — INSULIN ASPART 100 UNIT/ML IJ SOLN: 0.0000 [IU] | Freq: Every day | INTRAMUSCULAR | Status: DC

## 2023-11-05 MED ORDER — GUAIFENESIN ER 600 MG PO TB12
600.0000 mg | ORAL_TABLET | Freq: Two times a day (BID) | ORAL | Status: DC
  Administered 2023-11-05 – 2023-11-09 (×8): 600 mg via ORAL
  Filled 2023-11-05 (×8): qty 1

## 2023-11-05 MED ORDER — CEFTRIAXONE SODIUM 1 G IJ SOLR
1.0000 g | Freq: Once | INTRAMUSCULAR | Status: AC
  Administered 2023-11-05: 1 g via INTRAVENOUS
  Filled 2023-11-05: qty 10

## 2023-11-05 MED ORDER — ATORVASTATIN CALCIUM 20 MG PO TABS
40.0000 mg | ORAL_TABLET | Freq: Every day | ORAL | Status: DC
  Administered 2023-11-06 – 2023-11-09 (×4): 40 mg via ORAL
  Filled 2023-11-05 (×4): qty 2

## 2023-11-05 MED ORDER — NICOTINE 21 MG/24HR TD PT24
21.0000 mg | MEDICATED_PATCH | Freq: Every day | TRANSDERMAL | Status: DC
  Administered 2023-11-06: 21 mg via TRANSDERMAL
  Filled 2023-11-05 (×3): qty 1

## 2023-11-05 MED ORDER — ASPIRIN 81 MG PO TBEC
81.0000 mg | DELAYED_RELEASE_TABLET | Freq: Every day | ORAL | Status: DC
  Administered 2023-11-06 – 2023-11-09 (×4): 81 mg via ORAL
  Filled 2023-11-05 (×4): qty 1

## 2023-11-05 MED ORDER — HYDROCODONE-ACETAMINOPHEN 5-325 MG PO TABS: 1.0000 | ORAL_TABLET | ORAL | Status: DC | PRN

## 2023-11-05 MED ORDER — IPRATROPIUM-ALBUTEROL 0.5-2.5 (3) MG/3ML IN SOLN
3.0000 mL | Freq: Four times a day (QID) | RESPIRATORY_TRACT | Status: DC
  Administered 2023-11-06 – 2023-11-07 (×7): 3 mL via RESPIRATORY_TRACT
  Filled 2023-11-05 (×8): qty 3

## 2023-11-05 MED ORDER — METHYLPREDNISOLONE SODIUM SUCC 40 MG IJ SOLR
40.0000 mg | Freq: Two times a day (BID) | INTRAMUSCULAR | Status: AC
  Administered 2023-11-05 – 2023-11-06 (×2): 40 mg via INTRAVENOUS
  Filled 2023-11-05 (×2): qty 1

## 2023-11-05 MED ORDER — CLOPIDOGREL BISULFATE 75 MG PO TABS
75.0000 mg | ORAL_TABLET | Freq: Every day | ORAL | Status: DC
  Administered 2023-11-06 – 2023-11-09 (×4): 75 mg via ORAL
  Filled 2023-11-05 (×4): qty 1

## 2023-11-05 MED ORDER — ALBUTEROL SULFATE (2.5 MG/3ML) 0.083% IN NEBU: 2.5000 mg | INHALATION_SOLUTION | RESPIRATORY_TRACT | Status: DC | PRN

## 2023-11-05 MED ORDER — PREDNISONE 20 MG PO TABS: 40.0000 mg | ORAL_TABLET | Freq: Every day | ORAL | Status: DC

## 2023-11-05 MED ORDER — METOPROLOL SUCCINATE ER 25 MG PO TB24
12.5000 mg | ORAL_TABLET | Freq: Every day | ORAL | Status: DC
  Administered 2023-11-06 – 2023-11-09 (×4): 12.5 mg via ORAL
  Filled 2023-11-05 (×4): qty 1

## 2023-11-05 MED ORDER — ACETAMINOPHEN 650 MG RE SUPP: 650.0000 mg | Freq: Four times a day (QID) | RECTAL | Status: DC | PRN

## 2023-11-05 MED ORDER — ENOXAPARIN SODIUM 30 MG/0.3ML IJ SOSY
30.0000 mg | PREFILLED_SYRINGE | INTRAMUSCULAR | Status: DC
  Administered 2023-11-05 – 2023-11-07 (×3): 30 mg via SUBCUTANEOUS
  Filled 2023-11-05 (×3): qty 0.3

## 2023-11-05 MED ORDER — ONDANSETRON HCL 4 MG PO TABS: 4.0000 mg | ORAL_TABLET | Freq: Four times a day (QID) | ORAL | Status: DC | PRN

## 2023-11-05 MED ORDER — MAGNESIUM SULFATE 2 GM/50ML IV SOLN
2.0000 g | Freq: Once | INTRAVENOUS | Status: AC
  Administered 2023-11-05: 2 g via INTRAVENOUS
  Filled 2023-11-05: qty 50

## 2023-11-05 MED ORDER — SODIUM CHLORIDE 0.9 % IV SOLN
1.0000 g | INTRAVENOUS | Status: DC
  Administered 2023-11-06: 1 g via INTRAVENOUS
  Filled 2023-11-05: qty 10

## 2023-11-05 MED ORDER — ACETAMINOPHEN 325 MG PO TABS: 650.0000 mg | ORAL_TABLET | Freq: Four times a day (QID) | ORAL | Status: DC | PRN

## 2023-11-05 MED ORDER — IPRATROPIUM-ALBUTEROL 0.5-2.5 (3) MG/3ML IN SOLN
3.0000 mL | Freq: Once | RESPIRATORY_TRACT | Status: AC
  Administered 2023-11-05: 3 mL via RESPIRATORY_TRACT
  Filled 2023-11-05: qty 3

## 2023-11-05 MED ORDER — INSULIN ASPART 100 UNIT/ML IJ SOLN
0.0000 [IU] | Freq: Three times a day (TID) | INTRAMUSCULAR | Status: DC
  Administered 2023-11-06: 3 [IU] via SUBCUTANEOUS
  Administered 2023-11-06: 8 [IU] via SUBCUTANEOUS
  Administered 2023-11-06 – 2023-11-07 (×3): 3 [IU] via SUBCUTANEOUS
  Administered 2023-11-07: 2 [IU] via SUBCUTANEOUS
  Administered 2023-11-08 (×2): 3 [IU] via SUBCUTANEOUS
  Administered 2023-11-08 – 2023-11-09 (×2): 2 [IU] via SUBCUTANEOUS
  Filled 2023-11-05 (×6): qty 1
  Filled 2023-11-05 (×2): qty 2
  Filled 2023-11-05: qty 1

## 2023-11-05 MED ORDER — BUSPIRONE HCL 10 MG PO TABS
30.0000 mg | ORAL_TABLET | Freq: Two times a day (BID) | ORAL | Status: DC
  Administered 2023-11-05 – 2023-11-09 (×8): 30 mg via ORAL
  Filled 2023-11-05 (×3): qty 6
  Filled 2023-11-05: qty 3
  Filled 2023-11-05: qty 6
  Filled 2023-11-05 (×3): qty 3

## 2023-11-05 MED ORDER — TAMSULOSIN HCL 0.4 MG PO CAPS
0.4000 mg | ORAL_CAPSULE | Freq: Every day | ORAL | Status: DC
Start: 2023-11-06 — End: 2023-11-09
  Administered 2023-11-06 – 2023-11-09 (×4): 0.4 mg via ORAL
  Filled 2023-11-05 (×4): qty 1

## 2023-11-05 MED ORDER — TRAZODONE HCL 50 MG PO TABS
50.0000 mg | ORAL_TABLET | Freq: Every day | ORAL | Status: DC
  Administered 2023-11-05 – 2023-11-08 (×4): 50 mg via ORAL
  Filled 2023-11-05 (×4): qty 1

## 2023-11-05 MED ORDER — SACUBITRIL-VALSARTAN 24-26 MG PO TABS
1.0000 | ORAL_TABLET | Freq: Two times a day (BID) | ORAL | Status: DC
  Administered 2023-11-05 – 2023-11-09 (×7): 1 via ORAL
  Filled 2023-11-05 (×9): qty 1

## 2023-11-05 MED ORDER — SODIUM CHLORIDE 0.9 % IV BOLUS
500.0000 mL | Freq: Once | INTRAVENOUS | Status: AC
  Administered 2023-11-05: 500 mL via INTRAVENOUS

## 2023-11-05 MED ORDER — DAPAGLIFLOZIN PROPANEDIOL 10 MG PO TABS
10.0000 mg | ORAL_TABLET | Freq: Every day | ORAL | Status: DC
  Administered 2023-11-06 – 2023-11-09 (×4): 10 mg via ORAL
  Filled 2023-11-05 (×4): qty 1

## 2023-11-05 NOTE — ED Triage Notes (Signed)
 Patient brought in via [facility] from home with complaints of worsening shortness of breath and increased work of breathing. Patient has hx of COPD, still smokes 1 pack per day. States he has not felt good all week. Initial O2 80%, patient does not wear oxygen normally at home. Refused CPAP en route and states he does not want CPAP/BIPAP here and also does not wanted to be intubated.

## 2023-11-05 NOTE — Assessment & Plan Note (Signed)
 Continue home metoprolol.

## 2023-11-05 NOTE — Progress Notes (Signed)
 Anticoagulation monitoring(Lovenox ):  78 yo  male ordered Lovenox  40 mg Q24h    Filed Weights   11/05/23 1934  Weight: 50.6 kg (111 lb 8.8 oz)   BMI 17    Lab Results  Component Value Date   CREATININE 1.75 (H) 11/05/2023   CREATININE 1.33 (H) 09/23/2023   CREATININE 1.69 (H) 07/02/2023   Estimated Creatinine Clearance: 24.9 mL/min (A) (by C-G formula based on SCr of 1.75 mg/dL (H)). Hemoglobin & Hematocrit     Component Value Date/Time   HGB 9.4 (L) 11/05/2023 2000   HGB 9.9 (L) 03/26/2023 1103   HGB 11.4 (L) 07/06/2020 1505   HCT 28.7 (L) 11/05/2023 2000   HCT 33.3 (L) 07/06/2020 1505     Per Protocol for Patient with estCrcl < 30 ml/min and BMI < 30, will transition to Lovenox  30 mg Q24h.

## 2023-11-05 NOTE — ED Notes (Addendum)
 a

## 2023-11-05 NOTE — Assessment & Plan Note (Addendum)
 Sliding scale insulin  coverage and Farxiga .  Last hemoglobin A1c actually low at 5.5

## 2023-11-05 NOTE — Assessment & Plan Note (Addendum)
 Continue metoprolol  and Entresto  and Farxiga 

## 2023-11-05 NOTE — Assessment & Plan Note (Signed)
 Possible complicating factor to overall prognosis

## 2023-11-05 NOTE — Assessment & Plan Note (Signed)
 PAD and carotid artery stenosis Chronic mesenteric stenosis s/p celiac stent No acute issues suspected at this time Continue aspirin  and statin

## 2023-11-05 NOTE — ED Notes (Signed)
 Patient placed on 6L high flow [place] by RT with O2 sats at 100%.

## 2023-11-05 NOTE — Assessment & Plan Note (Addendum)
 Creatinine 1.75 up from baseline of 0.68 about 7 months prior.  Today's creatinine down to 1.30.  May have underlying chronic kidney disease stage IIIa.  Need to check creatinine as outpatient.

## 2023-11-05 NOTE — Assessment & Plan Note (Signed)
 Followed by oncology

## 2023-11-05 NOTE — Assessment & Plan Note (Addendum)
 Acute on chronic respiratory failure with hypoxia Tobacco use disorder Patient requiring 8 L HFNC to maintain sats in the low to mid 90s on 10/9.  Patient was on 3 L when I saw him.  Patient needed 4 L at rest and 6 L with ambulation to maintain saturations above 88%. Continue Solu-Medrol  here and switch over to prednisone  for 2 more days upon discharge. Rocephin  and  Zithromax  while here and Augmentin  and Zithromax  orally upon discharge to complete course. Flutter valve and incentive spirometer Nicotine  patch declined.  Chantix. Pulmonary consultation appreciated

## 2023-11-05 NOTE — ED Notes (Signed)
 Called CCMD for monitoring

## 2023-11-05 NOTE — ED Provider Notes (Signed)
 [facility] Provider Note   Event Date/Time   First MD Initiated Contact with Patient 11/05/23 1942     (approximate) History  Respiratory Distress  HPI Todd Whitehead is a 78 y.o. male with a stated past medical history of COPD not on home O2, CHF, and persistent tobacco abuse who presents via EMS complaining of worsening shortness of breath with productive cough of white sputum.  Patient denies any fever/chills.  Patient denies any recent travel, sick contacts.  Patient states that he has been taking all of his medications on time and as prescribed however he has become confused in the last 24 hours and does not know whether he is taking his medications this morning.  EMS has given patient 6 mL DuoNeb and 125 Solu-Medrol  prior to arrival.  Patient hypoxic on room air to 80% and placed on 6 L nasal cannula ROS: Patient currently denies any vision changes, tinnitus, difficulty speaking, facial droop, sore throat, chest pain, abdominal pain, nausea/vomiting/diarrhea, dysuria, or weakness/numbness/paresthesias in any extremity   Physical Exam  Triage Vital Signs: ED Triage Vitals  Encounter Vitals Group     BP 11/05/23 1936 (!) 149/71     Girls Systolic BP Percentile --      Girls Diastolic BP Percentile --      Boys Systolic BP Percentile --      Boys Diastolic BP Percentile --      Pulse Rate 11/05/23 1933 90     Resp 11/05/23 1933 (!) 26     Temp 11/05/23 1933 98.7 F (37.1 C)     Temp Source 11/05/23 1933 Oral     SpO2 11/05/23 1933 100 %     Weight 11/05/23 1934 111 lb 8.8 oz (50.6 kg)     Height 11/05/23 1934 5' 8 (1.727 m)     Head Circumference --      Peak Flow --      Pain Score 11/05/23 1934 0     Pain Loc --      Pain Education --      Exclude from Growth Chart --    Most recent vital signs: Vitals:   11/06/23 0715 11/06/23 0800  BP: (!) 119/50 (!) 125/54  Pulse: 75 81  Resp: (!) 30 (!) 27  Temp:  98.4 F (36.9 C)  SpO2: 99% 95%    General: Awake, oriented x4. CV:  Good peripheral perfusion. Resp:  Increased effort.  Expiratory wheezing over bilateral lung fields Abd:  No distention. Other:  Elderly cachectic appearing Caucasian male resting comfortably in no acute distress ED Results / Procedures / Treatments  Labs (all labs ordered are listed, but only abnormal results are displayed) Labs Reviewed  BASIC METABOLIC PANEL WITH GFR - Abnormal; Notable for the following components:      Result Value   Sodium 133 (*)    CO2 21 (*)    Glucose, Bld 153 (*)    BUN 36 (*)    Creatinine, Ser 1.75 (*)    GFR, Estimated 39 (*)    All other components within normal limits  CBC - Abnormal; Notable for the following components:   WBC 20.4 (*)    RBC 3.22 (*)    Hemoglobin 9.4 (*)    HCT 28.7 (*)    All other components within normal limits  CBC - Abnormal; Notable for the following components:   WBC 19.6 (*)    RBC 3.15 (*)    Hemoglobin 9.2 (*)  HCT 28.7 (*)    All other components within normal limits  BASIC METABOLIC PANEL WITH GFR - Abnormal; Notable for the following components:   Sodium 134 (*)    CO2 21 (*)    Glucose, Bld 178 (*)    BUN 33 (*)    Creatinine, Ser 1.60 (*)    Calcium  8.3 (*)    GFR, Estimated 44 (*)    All other components within normal limits  MAGNESIUM  - Abnormal; Notable for the following components:   Magnesium  3.0 (*)    All other components within normal limits  CBG MONITORING, ED - Abnormal; Notable for the following components:   Glucose-Capillary 177 (*)    All other components within normal limits  CBG MONITORING, ED - Abnormal; Notable for the following components:   Glucose-Capillary 158 (*)    All other components within normal limits  RESP PANEL BY RT-PCR (RSV, FLU A&B, COVID)  RVPGX2  BLOOD GAS, VENOUS  HEMOGLOBIN A1C  TROPONIN I (HIGH SENSITIVITY)  TROPONIN I (HIGH SENSITIVITY)   EKG ED ECG REPORT I, Artist MARLA Kerns, the attending physician, personally  viewed and interpreted this ECG. Date: 11/05/2023 EKG Time: 1950 Rate: 88 Rhythm: Atrial fibrillation QRS Axis: normal Intervals: normal ST/T Wave abnormalities: normal Narrative Interpretation: Technically poor study.  Possible atrial fibrillation.  Patient has history of postoperative A-fib.  No evidence of acute ischemia RADIOLOGY ED MD interpretation: Single view portable chest x-ray shows symmetrically hyperinflated lungs and interstitial thickening, consistent with underlying COPD and airway inflammation - All radiology independently interpreted and agree with radiology assessment Official radiology report(s): DG Chest Portable 1 View Result Date: 11/05/2023 EXAM: 1 VIEW XRAY OF THE CHEST 11/05/2023 07:50:38 PM COMPARISON: 03/18/2023 CLINICAL HISTORY: Shortness of breath. COPD, smoker FINDINGS: LUNGS AND PLEURA: The lungs are symmetrically hyperinflated in keeping with changes of underlying COPD. Interstitial thickening in keeping with airway inflammation noted. HEART AND MEDIASTINUM: Aortic atherosclerosis. CABG markers noted. Epicardial leads in place. Sternotomy hardware noted. BONES AND SOFT TISSUES: No acute osseous abnormality. IMPRESSION: 1. Symmetrically hyperinflated lungs and interstitial thickening, consistent with underlying COPD and airway inflammation. Electronically signed by: Dorethia Molt MD 11/05/2023 08:00 PM EDT RP Workstation: [id number]   PROCEDURES: Critical Care performed: Yes, see critical care procedure note(s) Procedures CRITICAL CARE Performed by: Makahla Kiser K Rex Oesterle  Total critical care time: 41 minutes  Critical care time was exclusive of separately billable procedures and treating other patients.  Critical care was necessary to treat or prevent imminent or life-threatening deterioration.  Critical care was time spent personally by me on the following activities: development of treatment plan with patient and/or surrogate as well as nursing, discussions with  consultants, evaluation of patient's response to treatment, examination of patient, obtaining history from patient or surrogate, ordering and performing treatments and interventions, ordering and review of laboratory studies, ordering and review of radiographic studies, pulse oximetry and re-evaluation of patient's condition.  MEDICATIONS ORDERED IN ED: Medications  nicotine  (NICODERM CQ  - dosed in mg/24 hours) patch 21 mg (21 mg Transdermal Patch Applied 11/06/23 0909)  aspirin  EC tablet 81 mg (81 mg Oral Given 11/06/23 0905)  atorvastatin  (LIPITOR) tablet 40 mg (40 mg Oral Given 11/06/23 1031)  metoprolol  succinate (TOPROL -XL) 24 hr tablet 12.5 mg (12.5 mg Oral Given 11/06/23 0905)  sacubitril -valsartan  (ENTRESTO ) 24-26 mg per tablet (1 tablet Oral Given 11/06/23 1032)  busPIRone  (BUSPAR ) tablet 30 mg (30 mg Oral Given 11/06/23 1031)  citalopram  (CELEXA ) tablet 20 mg (20 mg Oral Given  11/06/23 1031)  traZODone  (DESYREL ) tablet 50 mg (50 mg Oral Given 11/05/23 2308)  dapagliflozin  propanediol (FARXIGA ) tablet 10 mg (10 mg Oral Given 11/06/23 1032)  tamsulosin  (FLOMAX ) capsule 0.4 mg (0.4 mg Oral Given 11/06/23 1109)  clopidogrel  (PLAVIX ) tablet 75 mg (75 mg Oral Given 11/06/23 0905)  enoxaparin  (LOVENOX ) injection 30 mg (30 mg Subcutaneous Given 11/05/23 2319)  acetaminophen  (TYLENOL ) tablet 650 mg (has no administration in time range)    Or  acetaminophen  (TYLENOL ) suppository 650 mg (has no administration in time range)  ondansetron  (ZOFRAN ) tablet 4 mg (has no administration in time range)    Or  ondansetron  (ZOFRAN ) injection 4 mg (has no administration in time range)  cefTRIAXone  (ROCEPHIN ) 1 g in sodium chloride  0.9 % 100 mL IVPB (has no administration in time range)  methylPREDNISolone  sodium succinate (SOLU-MEDROL ) 40 mg/mL injection 40 mg (40 mg Intravenous Given 11/06/23 0906)    Followed by  predniSONE  (DELTASONE ) tablet 40 mg (has no administration in time range)  ipratropium-albuterol   (DUONEB) 0.5-2.5 (3) MG/3ML nebulizer solution 3 mL (3 mLs Nebulization Given 11/06/23 0911)  albuterol  (PROVENTIL ) (2.5 MG/3ML) 0.083% nebulizer solution 2.5 mg (has no administration in time range)  insulin  aspart (novoLOG ) injection 0-15 Units (3 Units Subcutaneous Given 11/06/23 0912)  insulin  aspart (novoLOG ) injection 0-5 Units (0 Units Subcutaneous Hold 11/05/23 2316)  HYDROcodone -acetaminophen  (NORCO/VICODIN) 5-325 MG per tablet 1-2 tablet (has no administration in time range)  guaiFENesin  (MUCINEX ) 12 hr tablet 600 mg (600 mg Oral Given 11/06/23 0905)  varenicline (CHANTIX) tablet 0.5 mg (0.5 mg Oral Given 11/06/23 1032)  ipratropium-albuterol  (DUONEB) 0.5-2.5 (3) MG/3ML nebulizer solution 3 mL (3 mLs Nebulization Given 11/05/23 1949)  magnesium  sulfate IVPB 2 g 50 mL (0 g Intravenous Stopped 11/05/23 2104)  cefTRIAXone  (ROCEPHIN ) 1 g in sodium chloride  0.9 % 100 mL IVPB (0 g Intravenous Stopped 11/05/23 2212)  azithromycin  (ZITHROMAX ) 500 mg in sodium chloride  0.9 % 250 mL IVPB (0 mg Intravenous Stopped 11/05/23 2258)  sodium chloride  0.9 % bolus 500 mL (0 mLs Intravenous Stopped 11/06/23 0033)   IMPRESSION / MDM / ASSESSMENT AND PLAN / ED COURSE  I reviewed the triage vital signs and the nursing notes.                             The patient is on the cardiac monitor to evaluate for evidence of arrhythmia and/or significant heart rate changes. Patient's presentation is most consistent with acute presentation with potential threat to life or bodily function. Patient is a 78 year old male with the above-stated past medical history presents complaining of worsening shortness of breath, found to be hypoxic, and placed on nasal cannula DDx: COPD exacerbation, pneumonia, viral URI, PE Plan: CBC, BMP, troponin, VBG, RVP, chest x-ray, EKG  Reassessment: After treatment, the patient's shortness of breath is improving but patient is still requiring supplemental oxygenation.  Upon chart review, patient  is reportedly on 2 L by nasal cannula at home however this oxygen requirement is still increased today at 5-6.  Therefore patient will require admission to the internal medicine service for further evaluation and management.  Patient agrees with plan  Disposition: Admit   FINAL CLINICAL IMPRESSION(S) / ED DIAGNOSES   Final diagnoses:  Acute on chronic hypoxic respiratory failure (HCC)  COPD exacerbation (HCC)  Respiratory distress  Shortness of breath   Rx / DC Orders   ED Discharge Orders     None  Note:  This document was prepared using Dragon voice recognition software and may include unintentional dictation errors.   Berklee Battey K, MD 11/06/23 1145

## 2023-11-05 NOTE — H&P (Signed)
 History and Physical    Patient: Todd Whitehead FMW:[id number] DOB: [birth year] DOA: 11/05/2023 DOS: the patient was seen and examined on 11/05/2023 PCP: Lenon Layman ORN, MD  Patient coming from: Home  Chief Complaint:  Chief Complaint  Patient presents with   Respiratory Distress    HPI: Josiel Gahm is a 78 y.o. 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  He presented by EMS with a 3-day history of wheezing and increased work of breathing have not improved with home meds.  He refused CPAP en route.  He denies chest pain, fever or chills.  Denies lower extremity pain or swelling. In the ED, tachypneic to mid 20s to low 30s requiring 6 L HFNC to maintain sats in the mid 90s.  Vitals otherwise unremarkable. Labs notable for WBC 20,000, With hemoglobin near baseline at 9.4.  Creatinine 1.75, up from baseline of 0.68 about 7 months prior. Respiratory viral panel negative Troponin 15 EKG showing sinus at 88(read as A-fib but looks regular). Chest x-ray showing symmetrically hyperinflated lungs and interstitial thickening consistent with underlying COPD and airway inflammation. Patient treated with DuoNebs, magnesium  sulfate, started on Rocephin  and azithromycin . Admission requested     Past Medical History:  Diagnosis Date   Anxiety    Arthritis    CHF (congestive heart failure) (HCC)    Colon polyp    COPD (chronic obstructive pulmonary disease) (HCC)    smoker   Dysrhythmia    Glaucoma    Heart murmur    Hypertension    on  no meds   Low serum vitamin B12 11/18/2020   Melanoma (HCC)    Resected from Left upper arm.    Myocardial infarction [facility]) 2017   Peripheral  vascular disease    Prostate enlargement    Past Surgical History:  Procedure Laterality Date   BACK SURGERY  01/19/98   CARDIAC CATHETERIZATION N/A 08/19/2015   Procedure: Left Heart Cath and Coronary Angiography;  Surgeon: Vinie DELENA Jude, MD;  Location: [facility] INVASIVE CV LAB;  Service: Cardiovascular;  Laterality: N/A;   CHOLECYSTECTOMY N/A 08/19/2016   Procedure: LAPAROSCOPIC CHOLECYSTECTOMY WITH INTRAOPERATIVE CHOLANGIOGRAM;  Surgeon: Aron Shoulders, MD;  Location: [facility] OR;  Service: General;  Laterality: N/A;   COLON SURGERY  age 18 days old    bowel was twisted up   COLONOSCOPY WITH PROPOFOL  N/A 03/19/2016   Procedure: COLONOSCOPY WITH PROPOFOL ;  Surgeon: Gladis RAYMOND Mariner, MD;  Location: [facility] ENDOSCOPY;  Service: Endoscopy;  Laterality: N/A;   CORONARY ARTERY BYPASS GRAFT  2017   ERCP N/A 08/18/2016   Procedure: ENDOSCOPIC RETROGRADE CHOLANGIOPANCREATOGRAPHY (ERCP);  Surgeon: Saintclair Jasper, MD;  Location: [facility] ENDOSCOPY;  Service: Gastroenterology;  Laterality: N/A;   ESOPHAGOGASTRODUODENOSCOPY (EGD) WITH PROPOFOL  N/A 09/18/2018   Procedure: ESOPHAGOGASTRODUODENOSCOPY (EGD) WITH PROPOFOL ;  Surgeon: Tye Millet, DO;  Location: [facility] ENDOSCOPY;  Service: General;  Laterality: N/A;   EYE SURGERY Left 03/26/11   FIBEROPTIC BRONCHOSCOPY Bilateral 03/18/2023   Procedure: BEDSIDE BRONCHOSCOPY FIBEROPTIC;  Surgeon: Parris Manna, MD;  Location: [facility] ORS;  Service: Thoracic;  Laterality: Bilateral;   FLEXIBLE BRONCHOSCOPY Bilateral 03/18/2023   Procedure: FLEXIBLE BRONCHOSCOPY;  Surgeon: Parris Manna, MD;  Location: [facility] ORS;  Service: Thoracic;  Laterality: Bilateral;  LOWER EXTREMITY ANGIOGRAPHY Left 10/03/2017   Procedure: LOWER EXTREMITY ANGIOGRAPHY;  Surgeon: Marea Selinda RAMAN, MD;  Location: [facility] INV[facility] CV LAB;  Service: Cardiovascular;  Laterality: Left;   LOWER EXTREMITY ANGIOGRAPHY Right 10/31/2017   Procedure: LOWER EXTREMITY ANGIOGRAPHY;  Surgeon: Marea Selinda RAMAN, MD;  Location: [facility] INV[facility]IVE CV  LAB;  Service: Cardiovascular;  Laterality: Right;   LUMBAR LAMINECTOMY/DECOMPRESSION MICRODISCECTOMY Right 10/02/2012   Procedure: Right Lumbar Five-Sacral One Lumbar laminotomy/Microdiskectomy;  Surgeon: Lamar LELON Peaches, MD;  Location: [facility] NEURO ORS;  Service: Neurosurgery;  Laterality: Right;  Right Lumbar Five-Sacral One Lumbar laminotomy/Microdiskectomy   VISCERAL ANGIOGRAPHY N/A 11/08/2020   Procedure: VISCERAL ANGIOGRAPHY;  Surgeon: Jama Cordella MATSU, MD;  Location: [facility] INVASIVE CV LAB;  Service: Cardiovascular;  Laterality: N/A;   VISCERAL ANGIOGRAPHY N/A 11/25/2020   Procedure: VISCERAL ANGIOGRAPHY;  Surgeon: Jama Cordella MATSU, MD;  Location: [facility] INV[facility] CV LAB;  Service: Cardiovascular;  Laterality: N/A;   Social History:  reports that he has been smoking cigarettes. He has a 58 pack-year smoking history. He has never used smokeless tobacco. He reports that he does not drink alcohol and does not use drugs.  Allergies  Allergen Reactions   Other Nausea And Vomiting    Cheese,butter,sour cream,cottage cheese   Poison Ivy Extract Rash   Poison Oak Extract Rash    Family History  Problem Relation Age of Onset   Heart attack Father    Aneurysm Brother    Lung cancer Brother     Prior to Admission medications   Medication Sig Start Date End Date Taking? Authorizing Provider  acetaminophen  (TYLENOL ) 500 MG tablet Take 1 tablet (500 mg total) by mouth every 6 (six) hours as needed. 08/20/16   Mckinley Ledger, MD  aspirin  EC 81 MG tablet Take 81 mg by mouth daily. Swallow whole.    [provider]  atorvastatin  (LIPITOR) 40 MG tablet Take 40 mg by mouth daily.  05/22/17   [provider]  brimonidine  (ALPHAGAN ) 0.15 % ophthalmic solution Place 1 drop into both eyes 2 (two) times daily. 02/21/22   [provider]  busPIRone  (BUSPAR ) 30 MG tablet Take 30 mg by mouth 2 (two) times daily.    [provider]  citalopram  (CELEXA ) 20 MG tablet Take 20 mg by  mouth daily. 10/28/19   [provider]  clopidogrel  (PLAVIX ) 75 MG tablet TAKE 1 TABLET(75 MG) BY MOUTH DAILY 11/07/21   Brown, Fallon E, NP  dapagliflozin  propanediol (FARXIGA ) 10 MG TABS tablet Take 1 tablet (10 mg total) by mouth daily before breakfast. 11/13/21   Donette Ellouise LABOR, FNP  ferrous sulfate  325 (65 FE) MG EC tablet TAKE 1 TABLET(325 MG) BY MOUTH TWICE DAILY WITH A MEAL 11/05/22   Babara Call, MD  latanoprost  (XALATAN ) 0.005 % ophthalmic solution Place 1 drop into both eyes at bedtime.  06/14/17   [provider]  metoprolol  succinate (TOPROL -XL) 25 MG 24 hr tablet Take 12.5 mg by mouth daily.    [provider]  Multiple Vitamin (MULTIVITAMIN WITH MINERALS) TABS tablet Take 1 tablet by mouth daily. 01/31/21   Patel, Sona, MD  Multiple Vitamins-Minerals (CENTRUM ADULT 50+ MULTIGUMMIES PO) Take 1 tablet by mouth daily.    [provider]  sacubitril -valsartan  (ENTRESTO ) 24-26 MG Take 1 tablet by mouth 2 (two) times daily. 10/01/23   Donette Ellouise LABOR, FNP  tamsulosin  (FLOMAX ) 0.4 MG CAPS capsule Take 1 capsule (0.4 mg total) by mouth daily after breakfast. 08/20/16   Mckinley Ledger, MD  traZODone  (DESYREL ) 50 MG tablet Take 50 mg by mouth at bedtime. 06/02/19   [provider]    Physical Exam: Vitals:   11/05/23 1935 11/05/23 1936 11/05/23 2000 11/05/23 2030  BP:  (!) 149/71 (!) 173/136 (!) 131/57  Pulse:  99 81 87  Resp:  (!) 33 (!) 26 (!) 21  Temp:      TempSrc:      SpO2: 96% 100% 90% 99%  Weight:      Height:       Physical Exam Vitals and nursing note reviewed.  Constitutional:      General: He is not in acute distress. HENT:     Head: Normocephalic and atraumatic.  Cardiovascular:     Rate and Rhythm: Normal rate and regular rhythm.     Heart sounds: Normal heart sounds.  Pulmonary:     Effort: Tachypnea present.     Breath sounds: Rhonchi present.  Abdominal:     Palpations: Abdomen is soft.     Tenderness: There is no abdominal  tenderness.  Neurological:     Mental Status: Mental status is at baseline.     Labs on Admission: I have personally reviewed following labs and imaging studies  CBC: Recent Labs  Lab 11/05/23 2000  WBC 20.4*  HGB 9.4*  HCT 28.7*  MCV 89.1  PLT 374   Basic Metabolic Panel: Recent Labs  Lab 11/05/23 2000  NA 133*  K 4.3  CL 101  CO2 21*  GLUCOSE 153*  BUN 36*  CREATININE 1.75*  CALCIUM  8.9   GFR: Estimated Creatinine Clearance: 24.9 mL/min (A) (by C-G formula based on SCr of 1.75 mg/dL (H)). Liver Function Tests: No results for input(s): AST, ALT, ALKPHOS, BILITOT, PROT, ALBUMIN  in the last 168 hours. No results for input(s): LIPASE, AMYLASE in the last 168 hours. No results for input(s): AMMONIA in the last 168 hours. Coagulation Profile: No results for input(s): INR, PROTIME in the last 168 hours. Cardiac Enzymes: No results for input(s): CKTOTAL, CKMB, CKMBINDEX, TROPONINI in the last 168 hours. BNP (last 3 results) No results for input(s): PROBNP in the last 8760 hours. HbA1C: No results for input(s): HGBA1C in the last 72 hours. CBG: No results for input(s): GLUCAP in the last 168 hours. Lipid Profile: No results for input(s): CHOL, HDL, LDLCALC, TRIG, CHOLHDL, LDLDIRECT in the last 72 hours. Thyroid  Function Tests: No results for input(s): TSH, T4TOTAL, FREET4, T3FREE, THYROIDAB in the last 72 hours. Anemia Panel: No results for input(s): VITAMINB12, FOLATE, FERRITIN, TIBC, IRON, RETICCTPCT in the last 72 hours. Urine analysis:    Component Value Date/Time   COLORURINE YELLOW (A) 08/17/2016 0533   APPEARANCEUR Clear 12/07/2019 1028   LABSPEC 1.009 08/17/2016 0533   PHURINE 6.0 08/17/2016 0533   GLUCOSEU Negative 12/07/2019 1028   HGBUR MODERATE (A) 08/17/2016 0533   BILIRUBINUR Negative 12/07/2019 1028   KETONESUR NEGATIVE 08/17/2016 0533   PROTEINUR 2+ (A) 12/07/2019 1028    PROTEINUR 30 (A) 08/17/2016 0533   NITRITE Negative 12/07/2019 1028   NITRITE NEGATIVE 08/17/2016 0533   LEUKOCYTESUR Negative 12/07/2019 1028    Radiological Exams on Admission: DG Chest Portable 1 View Result Date: 11/05/2023 EXAM: 1 VIEW XRAY OF THE CHEST 11/05/2023 07:50:38 PM COMPARISON: 03/18/2023 CLINICAL HISTORY: Shortness of breath. COPD, smoker FINDINGS: LUNGS AND PLEURA: The lungs are symmetrically hyperinflated in keeping with changes of underlying COPD. Interstitial thickening in keeping with airway inflammation noted. HEART AND MEDIASTINUM: Aortic atherosclerosis. CABG markers noted. Epicardial leads  in place. Sternotomy hardware noted. BONES AND SOFT TISSUES: No acute osseous abnormality. IMPRESSION: 1. Symmetrically hyperinflated lungs and interstitial thickening, consistent with underlying COPD and airway inflammation. Electronically signed by: Dorethia Molt MD 11/05/2023 08:00 PM EDT RP Workstation: [id number]   Data Reviewed for HPI: Relevant notes from primary care and specialist visits, past discharge summaries as available in EHR, including Care Everywhere. Prior diagnostic testing as pertinent to current admission diagnoses Updated medications and problem lists for reconciliation ED course, including vitals, labs, imaging, treatment and response to treatment Triage notes, nursing and pharmacy notes and ED provider's notes Notable results as noted above in HPI      Assessment and Plan: * COPD with acute exacerbation (HCC) Acute on chronic respiratory failure with hypoxia Tobacco use disorder Patient requiring 5 to 6 L HFNC to maintain sats in the low to mid 90s-at baseline requires 2 to 3 L but noncompliant per last pulmonology note 08/2023 Schedule and as needed nebulized bronchodilator treatment IV steroids Rocephin  due to severity of exacerbation and leukocytosis of 20,000 Flutter valve Supplemental oxygen and wean as tolerated--patient previously refused CPAP  or BiPAP but will offer again if needed Guaifenesin  Flutter valve and incentive spirometer Nicotine  patch and tobacco cessation counseling  NSVT (nonsustained ventricular tachycardia) (HCC) Addendum: Following admission had a nonsustained run of V. 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%) (heart failure with reduced ejection fraction) (HCC) Clinically euvolemic to dry Continue GDMT with metoprolol  and Entresto  and Farxiga   AKI (acute kidney injury) Creatinine 1.75 up from baseline of 0.68 about 7 months prior Will give an NS bolus Monitor renal function and avoid nephrotoxins  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) PAD and carotid artery stenosis Chronic mesenteric stenosis s/p celiac stent No acute issues suspected at this time Continue aspirin  and statin  Diabetes mellitus, type II (HCC) Sliding scale insulin  coverage and Farxiga  Monitor for hyperglycemia in view of IV steroids  Hypertension Continue home metoprolol   Underweight (BMI < 18.5) Possible complicating factor to overall prognosis  MGUS (monoclonal gammopathy of unknown significance) Followed by oncology  Depression with anxiety Continue buspirone , citalopram  and trazodone  pending med rec     DVT prophylaxis: Lovenox   Consults: none  Advance Care Planning:   Code Status: Prior   Family Communication: none  Disposition Plan: Back to previous home environment  Severity of Illness: The appropriate patient status for this patient is OBSERVATION. Observation status is judged to be reasonable and necessary in order to provide the required intensity of service to ensure the patient's safety. The patient's presenting symptoms, physical exam findings, and initial radiographic and laboratory data in the context of their medical condition is felt to place  them at decreased risk for further clinical deterioration. Furthermore, it is anticipated that the patient will be medically stable for discharge from the hospital within 2 midnights of admission.   Author: Delayne LULLA Solian, MD 11/05/2023 10:19 PM  For on call review [email or web address].

## 2023-11-05 NOTE — Assessment & Plan Note (Signed)
 Continue buspirone , citalopram  and trazodone  pending med rec

## 2023-11-06 DIAGNOSIS — J9811 Atelectasis: Secondary | ICD-10-CM | POA: Diagnosis not present

## 2023-11-06 DIAGNOSIS — I2510 Atherosclerotic heart disease of native coronary artery without angina pectoris: Secondary | ICD-10-CM | POA: Diagnosis present

## 2023-11-06 DIAGNOSIS — E785 Hyperlipidemia, unspecified: Secondary | ICD-10-CM | POA: Diagnosis present

## 2023-11-06 DIAGNOSIS — J9621 Acute and chronic respiratory failure with hypoxia: Secondary | ICD-10-CM | POA: Diagnosis present

## 2023-11-06 DIAGNOSIS — N1831 Chronic kidney disease, stage 3a: Secondary | ICD-10-CM | POA: Diagnosis present

## 2023-11-06 DIAGNOSIS — I6529 Occlusion and stenosis of unspecified carotid artery: Secondary | ICD-10-CM | POA: Diagnosis present

## 2023-11-06 DIAGNOSIS — N400 Enlarged prostate without lower urinary tract symptoms: Secondary | ICD-10-CM | POA: Diagnosis present

## 2023-11-06 DIAGNOSIS — J432 Centrilobular emphysema: Secondary | ICD-10-CM | POA: Diagnosis present

## 2023-11-06 DIAGNOSIS — J188 Other pneumonia, unspecified organism: Secondary | ICD-10-CM | POA: Diagnosis not present

## 2023-11-06 DIAGNOSIS — I10 Essential (primary) hypertension: Secondary | ICD-10-CM

## 2023-11-06 DIAGNOSIS — N179 Acute kidney failure, unspecified: Secondary | ICD-10-CM

## 2023-11-06 DIAGNOSIS — I130 Hypertensive heart and chronic kidney disease with heart failure and stage 1 through stage 4 chronic kidney disease, or unspecified chronic kidney disease: Secondary | ICD-10-CM | POA: Diagnosis present

## 2023-11-06 DIAGNOSIS — I5022 Chronic systolic (congestive) heart failure: Secondary | ICD-10-CM | POA: Diagnosis present

## 2023-11-06 DIAGNOSIS — F17200 Nicotine dependence, unspecified, uncomplicated: Secondary | ICD-10-CM | POA: Diagnosis not present

## 2023-11-06 DIAGNOSIS — Z1152 Encounter for screening for COVID-19: Secondary | ICD-10-CM | POA: Diagnosis not present

## 2023-11-06 DIAGNOSIS — E1122 Type 2 diabetes mellitus with diabetic chronic kidney disease: Secondary | ICD-10-CM | POA: Diagnosis present

## 2023-11-06 DIAGNOSIS — F17210 Nicotine dependence, cigarettes, uncomplicated: Secondary | ICD-10-CM | POA: Diagnosis present

## 2023-11-06 DIAGNOSIS — Z66 Do not resuscitate: Secondary | ICD-10-CM | POA: Diagnosis present

## 2023-11-06 DIAGNOSIS — J441 Chronic obstructive pulmonary disease with (acute) exacerbation: Secondary | ICD-10-CM | POA: Diagnosis present

## 2023-11-06 DIAGNOSIS — F32A Depression, unspecified: Secondary | ICD-10-CM | POA: Diagnosis present

## 2023-11-06 DIAGNOSIS — K551 Chronic vascular disorders of intestine: Secondary | ICD-10-CM | POA: Diagnosis present

## 2023-11-06 DIAGNOSIS — Z681 Body mass index (BMI) 19 or less, adult: Secondary | ICD-10-CM | POA: Diagnosis not present

## 2023-11-06 DIAGNOSIS — D472 Monoclonal gammopathy: Secondary | ICD-10-CM

## 2023-11-06 DIAGNOSIS — J438 Other emphysema: Secondary | ICD-10-CM | POA: Diagnosis present

## 2023-11-06 DIAGNOSIS — J189 Pneumonia, unspecified organism: Secondary | ICD-10-CM | POA: Diagnosis present

## 2023-11-06 DIAGNOSIS — R636 Underweight: Secondary | ICD-10-CM

## 2023-11-06 DIAGNOSIS — F418 Other specified anxiety disorders: Secondary | ICD-10-CM

## 2023-11-06 DIAGNOSIS — I472 Ventricular tachycardia, unspecified: Secondary | ICD-10-CM | POA: Diagnosis present

## 2023-11-06 DIAGNOSIS — J440 Chronic obstructive pulmonary disease with acute lower respiratory infection: Secondary | ICD-10-CM | POA: Diagnosis present

## 2023-11-06 DIAGNOSIS — R0602 Shortness of breath: Secondary | ICD-10-CM | POA: Diagnosis present

## 2023-11-06 DIAGNOSIS — E1151 Type 2 diabetes mellitus with diabetic peripheral angiopathy without gangrene: Secondary | ICD-10-CM | POA: Diagnosis present

## 2023-11-06 DIAGNOSIS — I4729 Other ventricular tachycardia: Secondary | ICD-10-CM

## 2023-11-06 LAB — CBC
HCT: 28.7 % — ABNORMAL LOW (ref 39.0–52.0)
Hemoglobin: 9.2 g/dL — ABNORMAL LOW (ref 13.0–17.0)
MCH: 29.2 pg (ref 26.0–34.0)
MCHC: 32.1 g/dL (ref 30.0–36.0)
MCV: 91.1 fL (ref 80.0–100.0)
Platelets: 385 K/uL (ref 150–400)
RBC: 3.15 MIL/uL — ABNORMAL LOW (ref 4.22–5.81)
RDW: 14.6 % (ref 11.5–15.5)
WBC: 19.6 K/uL — ABNORMAL HIGH (ref 4.0–10.5)
nRBC: 0 % (ref 0.0–0.2)

## 2023-11-06 LAB — BASIC METABOLIC PANEL WITH GFR
Anion gap: 10 (ref 5–15)
BUN: 33 mg/dL — ABNORMAL HIGH (ref 8–23)
CO2: 21 mmol/L — ABNORMAL LOW (ref 22–32)
Calcium: 8.3 mg/dL — ABNORMAL LOW (ref 8.9–10.3)
Chloride: 103 mmol/L (ref 98–111)
Creatinine, Ser: 1.6 mg/dL — ABNORMAL HIGH (ref 0.61–1.24)
GFR, Estimated: 44 mL/min — ABNORMAL LOW (ref >60)
Glucose, Bld: 178 mg/dL — ABNORMAL HIGH (ref 70–99)
Potassium: 4.3 mmol/L (ref 3.5–5.1)
Sodium: 134 mmol/L — ABNORMAL LOW (ref 135–145)

## 2023-11-06 LAB — CBG MONITORING, ED
Glucose-Capillary: 158 mg/dL — ABNORMAL HIGH (ref 70–99)
Glucose-Capillary: 168 mg/dL — ABNORMAL HIGH (ref 70–99)
Glucose-Capillary: 290 mg/dL — ABNORMAL HIGH (ref 70–99)
Glucose-Capillary: 185 mg/dL — ABNORMAL HIGH (ref 70–99)

## 2023-11-06 LAB — BLOOD GAS, VENOUS
Bicarbonate: 26.8 mmol/L (ref 20.0–28.0)
Patient temperature: 37 mmol/L (ref 0.0–2.0)
pCO2, Ven: 57 mmHg (ref 44–60)
pH, Ven: 7.28 (ref 7.25–7.43)
pO2, Ven: 26.8 mmHg (ref 32–45)

## 2023-11-06 LAB — MAGNESIUM: Magnesium: 3 mg/dL — ABNORMAL HIGH (ref 1.7–2.4)

## 2023-11-06 LAB — HEMOGLOBIN A1C
Hgb A1c MFr Bld: 5.5 % (ref 4.8–5.6)
Mean Plasma Glucose: 111.15 mg/dL

## 2023-11-06 MED ORDER — LATANOPROST 0.005 % OP SOLN
1.0000 [drp] | Freq: Every day | OPHTHALMIC | Status: DC
  Administered 2023-11-07 – 2023-11-08 (×2): 1 [drp] via OPHTHALMIC
  Filled 2023-11-06 (×2): qty 2.5

## 2023-11-06 MED ORDER — VARENICLINE TARTRATE 0.5 MG PO TABS
0.5000 mg | ORAL_TABLET | Freq: Two times a day (BID) | ORAL | Status: DC
  Administered 2023-11-06 – 2023-11-09 (×6): 0.5 mg via ORAL
  Filled 2023-11-06 (×10): qty 1

## 2023-11-06 MED ORDER — QUETIAPINE FUMARATE 25 MG PO TABS
50.0000 mg | ORAL_TABLET | Freq: Every day | ORAL | Status: DC
  Administered 2023-11-06 – 2023-11-08 (×3): 50 mg via ORAL
  Filled 2023-11-06 (×3): qty 2

## 2023-11-06 NOTE — Evaluation (Signed)
 Physical Therapy Evaluation Patient Details Name: Todd Whitehead MRN: [id number] DOB: December 19, 1945 Today's Date: 11/06/2023  History of Present Illness  Pt is a 78 y/o M presenting to ED with c/o SOB and productive cough. PMH signficant for COPD not compliant with home oxygen, CHF, DM, CAD s/p CABG, MGUS, PAD.  Clinical Impression  Pt A&Ox4, agreeable to PT evaluation. At baseline, pt is IND with mobility/ADLs, denies hx of falls. Pt was met semi-supine in bed, supervision for bed mobility with use of bed features. Multiple STS from elevated EOB with CGA, pt noted to be tremulous on initial standing attempts that improved with time. Pt amb ~15ft with RW and CGA, noticeably fatigued with activity and effortful to complete short bout of amb. Pt remained on 6L throughout session, SpO2 91% at rest, noted to desat to low 80s with activity with pt reporting SOB with mobility. Pt was left supine in bed at end of session, all needs in reach. Pt is displaying deficits in activity tolerance, balance, and strength impacting his overall functional mobility, would benefit from skilled PT intervention to address deficits and allow for safe return to PLOF.        If plan is discharge home, recommend the following: A little help with walking and/or transfers;A little help with bathing/dressing/bathroom;Assistance with cooking/housework;Assist for transportation;Help with stairs or ramp for entrance   Can travel by private vehicle   No    Equipment Recommendations Other (comment) (TBD at next venue of care)  Recommendations for Other Services       Functional Status Assessment Patient has had a recent decline in their functional status and demonstrates the ability to make significant improvements in function in a reasonable and predictable amount of time.     Precautions / Restrictions Precautions Precautions: Fall Recall of Precautions/Restrictions: Impaired Restrictions Weight Bearing Restrictions Per  Provider Order: No      Mobility  Bed Mobility Overal bed mobility: Needs Assistance Bed Mobility: Supine to Sit     Supine to sit: Supervision, HOB elevated, Used rails     General bed mobility comments: no physical assistance required, effortful    Transfers Overall transfer level: Needs assistance Equipment used: Rolling walker (2 wheels) Transfers: Sit to/from Stand Sit to Stand: Contact guard assist           General transfer comment: 2 STS from elevated EOB with CGA, VC for hand placement on RW    Ambulation/Gait Ambulation/Gait assistance: Contact guard assist Gait Distance (Feet): 24 Feet Assistive device: Rolling walker (2 wheels) Gait Pattern/deviations: Step-through pattern, Trunk flexed, Narrow base of support Gait velocity: decreased     General Gait Details: no LOB, pt visibly fatigued by short bout of amb, coughing throughout. Trunk flexed throughout  Stairs            Wheelchair Mobility     Tilt Bed    Modified Rankin (Stroke Patients Only)       Balance Overall balance assessment: Needs assistance Sitting-balance support: Feet supported Sitting balance-Leahy Scale: Fair Sitting balance - Comments: steady static sitting   Standing balance support: Bilateral upper extremity supported, Reliant on assistive device for balance Standing balance-Leahy Scale: Fair Standing balance comment: very tremulous on initial standing, improved with time, able to stand to use urinal with inc postural sway                             Pertinent Vitals/Pain Pain Assessment Pain  Assessment: No/denies pain    Home Living Family/patient expects to be discharged to:: Private residence Living Arrangements: Alone Available Help at Discharge: Family;Available PRN/intermittently Type of Home: House Home Access: Stairs to enter Entrance Stairs-Rails: None Entrance Stairs-Number of Steps: 2   Home Layout: One level Home Equipment: Cane -  single point Additional Comments: pt reports that he is moving to [place] soon to live with his daughter, did not state when    Prior Function Prior Level of Function : Independent/Modified Independent             Mobility Comments: IND with mobility, denies hx of falls ADLs Comments: IND with ADLs     Extremity/Trunk Assessment   Upper Extremity Assessment Upper Extremity Assessment: Generalized weakness    Lower Extremity Assessment Lower Extremity Assessment: Generalized weakness       Communication   Communication Communication: No apparent difficulties    Cognition Arousal: Alert Behavior During Therapy: WFL for tasks assessed/performed   PT - Cognitive impairments: No apparent impairments                       PT - Cognition Comments: A&Ox4 Following commands: Intact       Cueing Cueing Techniques: Verbal cues, Visual cues     General Comments      Exercises Other Exercises Other Exercises: SpO2 monitored throughout session with pt on 6L, 91% at rest, with activity desat to low 80s, returned to high 80s by end of session   Assessment/Plan    PT Assessment Patient needs continued PT services  PT Problem List Decreased strength;Decreased activity tolerance;Decreased balance;Decreased mobility;Decreased knowledge of use of DME;Decreased safety awareness;Cardiopulmonary status limiting activity       PT Treatment Interventions DME instruction;Gait training;Stair training;Functional mobility training;Therapeutic activities;Therapeutic exercise;Balance training;Neuromuscular re-education;Patient/family education    PT Goals (Current goals can be found in the Care Plan section)  Acute Rehab PT Goals Patient Stated Goal: to go home PT Goal Formulation: With patient Time For Goal Achievement: 11/20/23 Potential to Achieve Goals: Fair    Frequency Min 2X/week     Co-evaluation               AM-PAC PT 6 Clicks Mobility  Outcome  Measure Help needed turning from your back to your side while in a flat bed without using bedrails?: None Help needed moving from lying on your back to sitting on the side of a flat bed without using bedrails?: A Little Help needed moving to and from a bed to a chair (including a wheelchair)?: A Little Help needed standing up from a chair using your arms (e.g., wheelchair or bedside chair)?: A Little Help needed to walk in hospital room?: A Little Help needed climbing 3-5 steps with a railing? : A Lot 6 Click Score: 18    End of Session Equipment Utilized During Treatment: Gait belt;Oxygen Activity Tolerance: Patient limited by fatigue Patient left: in bed;with call bell/phone within reach Nurse Communication: Mobility status PT Visit Diagnosis: Unsteadiness on feet (R26.81);Other abnormalities of gait and mobility (R26.89);Muscle weakness (generalized) (M62.81);Difficulty in walking, not elsewhere classified (R26.2)    Time: 8487-8468 PT Time Calculation (min) (ACUTE ONLY): 19 min   Charges:   PT Evaluation $PT Eval Moderate Complexity: 1 Mod PT Treatments $Gait Training: 8-22 mins PT General Charges $$ ACUTE PT VISIT: 1 Visit         Jaeleah Smyser, SPT

## 2023-11-06 NOTE — ED Notes (Signed)
 Cleatus, MD at bedside to evaluate patient at this time.

## 2023-11-06 NOTE — ED Notes (Signed)
 This RN witnessed a 16 beat run of V tach. at this time. Patient denies any chest pain or worsening shortness of breath at this time and is asymptomatic. Cleatus, MD notified at this time.

## 2023-11-06 NOTE — Progress Notes (Signed)
 Progress Note   Patient: Todd Whitehead FMW:[id number] DOB: 09-28-1945 DOA: 11/05/2023     0 DOS: the patient was seen and examined on 11/06/2023   Brief hospital course: 78 y.o. 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  He presented by EMS with a 3-day history of wheezing and increased work of breathing have not improved with home meds.  He refused CPAP en route.  He denies chest pain, fever or chills.  Denies lower extremity pain or swelling. In the ED, tachypneic to mid 20s to low 30s requiring 6 L HFNC to maintain sats in the mid 90s.  Vitals otherwise unremarkable. Labs notable for WBC 20,000, With hemoglobin near baseline at 9.4.  Creatinine 1.75, up from baseline of 0.68 about 7 months prior. Respiratory viral panel negative Troponin 15 EKG showing sinus at 88(read as A-fib but looks regular). Chest x-ray showing symmetrically hyperinflated lungs and interstitial thickening consistent with underlying COPD and airway inflammation. Patient treated with DuoNebs, magnesium  sulfate, started on Rocephin  and azithromycin . Admission requested   10/8.  Patient breathing a little bit better today.  History of smoking.  States he was on Chantix at home.  Admitted with COPD exacerbation.  States he does not wear oxygen at home.  Assessment and Plan: * COPD with acute exacerbation (HCC) Acute on chronic respiratory failure with hypoxia Tobacco use disorder Patient requiring 6 L HFNC to maintain sats in the low to mid 90s-at baseline requires 2 to 3 L but noncompliant per last pulmonology note 08/2023 Continue Solu-Medrol , Rocephin , nebulizers Flutter valve and incentive spirometer Nicotine   patch declined.  Chantix.  NSVT (nonsustained ventricular tachycardia) (HCC) Asymptomatic.  Electrolytes okay.  Last echocardiogram showed a normal EF.  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  Today's creatinine down to 1.6.   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) PAD and carotid artery stenosis Chronic mesenteric stenosis s/p celiac stent No acute issues suspected at this time Continue aspirin  and statin  Diabetes mellitus, type II (HCC) Sliding scale insulin  coverage and Farxiga  Monitor for hyperglycemia in view of IV steroids  Hypertension Continue home metoprolol   Underweight (BMI < 18.5) Possible complicating factor to overall prognosis  MGUS (monoclonal gammopathy of unknown significance) Followed by oncology  Depression with anxiety Continue BuSpar , Celexa , Seroquel         Subjective: Patient came in with trouble breathing.  Breathing a lot better than coming in.  Still with cough and shortness of breath  Physical Exam: Vitals:   11/06/23 0715 11/06/23 0800 11/06/23 1200 11/06/23 1230  BP: (!) 119/50 (!) 125/54 (!) 116/50 (!) 116/50  Pulse: 75 81 69 69  Resp: (!) 30 (!) 27  (!) 27  Temp:  98.4 F (36.9 C)  98.4 F (36.9 C)  TempSrc:  Oral  Oral  SpO2: 99% 95% 99% 99%  Weight:      Height:       Physical Exam HENT:     Head: Normocephalic.     Mouth/Throat:     Pharynx: No oropharyngeal exudate.  Eyes:     General: Lids are normal.     Conjunctiva/sclera: Conjunctivae normal.  Cardiovascular:     Rate and Rhythm: Normal rate and regular rhythm.     Heart sounds: Normal heart sounds, S1 normal and S2 normal.  Pulmonary:     Breath sounds: Examination of the right-middle field reveals wheezing. Examination of the left-middle field reveals wheezing.  Examination of the right-lower field reveals decreased breath sounds and wheezing. Examination of the left-lower field reveals decreased breath sounds and wheezing. Decreased breath sounds and wheezing present. No rhonchi or rales.  Abdominal:     Palpations: Abdomen is soft.     Tenderness: There is no abdominal tenderness.  Musculoskeletal:     Right lower leg: No swelling.     Left lower leg: No swelling.  Skin:    General: Skin is warm.     Findings: No rash.  Neurological:     Mental Status: He is alert and oriented to person, place, and time.     Data Reviewed: Creatinine 1.6, magnesium  3.0, sodium 134, white blood cell count 19.6, hemoglobin 9.2, platelet count 385  Family Communication: Spoke with family at bedside  Disposition: Status is: Inpatient Remains inpatient appropriate because: Patient on high flow nasal cannula need to come down to regular nasal cannula prior to any disposition.  Will need better air entry.  Planned Discharge Destination: Home    Time spent: 28 minutes  Author: Charlie Patterson, MD 11/06/2023 1:09 PM  For on call review [email or web address].

## 2023-11-06 NOTE — Assessment & Plan Note (Addendum)
 Asymptomatic.  Electrolytes okay.  Last echocardiogram showed a normal EF.

## 2023-11-06 NOTE — Hospital Course (Addendum)
 78 y.o. 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  He presented by EMS with a 3-day history of wheezing and increased work of breathing have not improved with home meds.  He refused CPAP en route.  He denies chest pain, fever or chills.  Denies lower extremity pain or swelling. In the ED, tachypneic to mid 20s to low 30s requiring 6 L HFNC to maintain sats in the mid 90s.  Vitals otherwise unremarkable. Labs notable for WBC 20,000, With hemoglobin near baseline at 9.4.  Creatinine 1.75, up from baseline of 0.68 about 7 months prior. Respiratory viral panel negative Troponin 15 EKG showing sinus at 88(read as A-fib but looks regular). Chest x-ray showing symmetrically hyperinflated lungs and interstitial thickening consistent with underlying COPD and airway inflammation. Patient treated with DuoNebs, magnesium  sulfate, started on Rocephin  and azithromycin . Admission requested   10/8.  Patient breathing a little bit better today.  History of smoking.  States he was on Chantix at home.  Admitted with COPD exacerbation.  States he does not wear oxygen at home. 10/9.  Patient on 8 L high flow nasal cannula.  CT scan of the chest showing pneumonia and spiculated nodule. 10/10.  Down to 4 L regular nasal cannula this morning.  Physical therapy recommending rehab but patient will want to go home. 10/11.  Patient wanting to go home today.  Agreeable to wear oxygen.  He will be on 4 L at rest and 6 L with ambulation.

## 2023-11-07 ENCOUNTER — Inpatient Hospital Stay

## 2023-11-07 DIAGNOSIS — I5022 Chronic systolic (congestive) heart failure: Secondary | ICD-10-CM | POA: Diagnosis not present

## 2023-11-07 DIAGNOSIS — K551 Chronic vascular disorders of intestine: Secondary | ICD-10-CM

## 2023-11-07 DIAGNOSIS — J188 Other pneumonia, unspecified organism: Secondary | ICD-10-CM

## 2023-11-07 DIAGNOSIS — F17200 Nicotine dependence, unspecified, uncomplicated: Secondary | ICD-10-CM | POA: Diagnosis not present

## 2023-11-07 DIAGNOSIS — J9601 Acute respiratory failure with hypoxia: Secondary | ICD-10-CM

## 2023-11-07 DIAGNOSIS — J441 Chronic obstructive pulmonary disease with (acute) exacerbation: Secondary | ICD-10-CM | POA: Diagnosis not present

## 2023-11-07 DIAGNOSIS — R911 Solitary pulmonary nodule: Secondary | ICD-10-CM

## 2023-11-07 LAB — BASIC METABOLIC PANEL WITH GFR
Anion gap: 9 (ref 5–15)
BUN: 40 mg/dL — ABNORMAL HIGH (ref 8–23)
CO2: 22 mmol/L (ref 22–32)
Calcium: 8.6 mg/dL — ABNORMAL LOW (ref 8.9–10.3)
Chloride: 105 mmol/L (ref 98–111)
Creatinine, Ser: 1.46 mg/dL — ABNORMAL HIGH (ref 0.61–1.24)
GFR, Estimated: 49 mL/min — ABNORMAL LOW (ref >60)
Glucose, Bld: 167 mg/dL — ABNORMAL HIGH (ref 70–99)
Potassium: 4.2 mmol/L (ref 3.5–5.1)
Sodium: 136 mmol/L (ref 135–145)

## 2023-11-07 LAB — CBC
HCT: 28 % — ABNORMAL LOW (ref 39.0–52.0)
Hemoglobin: 9.1 g/dL — ABNORMAL LOW (ref 13.0–17.0)
MCH: 29 pg (ref 26.0–34.0)
MCHC: 32.5 g/dL (ref 30.0–36.0)
MCV: 89.2 fL (ref 80.0–100.0)
Platelets: 439 K/uL — ABNORMAL HIGH (ref 150–400)
RBC: 3.14 MIL/uL — ABNORMAL LOW (ref 4.22–5.81)
RDW: 14.6 % (ref 11.5–15.5)
WBC: 38.1 K/uL — ABNORMAL HIGH (ref 4.0–10.5)
nRBC: 0 % (ref 0.0–0.2)

## 2023-11-07 LAB — BLOOD GAS, ARTERIAL
Acid-base deficit: 1.8 mmol/L (ref 0.0–2.0)
Bicarbonate: 23.1 mmol/L (ref 20.0–28.0)
O2 Content: 8 L/min
O2 Saturation: 90.8 %
Patient temperature: 37
pCO2 arterial: 39 mmHg (ref 32–48)
pH, Arterial: 7.38 (ref 7.35–7.45)
pO2, Arterial: 66 mmHg — ABNORMAL LOW (ref 83–108)

## 2023-11-07 LAB — CBG MONITORING, ED
Glucose-Capillary: 152 mg/dL — ABNORMAL HIGH (ref 70–99)
Glucose-Capillary: 166 mg/dL — ABNORMAL HIGH (ref 70–99)
Glucose-Capillary: 162 mg/dL — ABNORMAL HIGH (ref 70–99)

## 2023-11-07 LAB — GLUCOSE, CAPILLARY: Glucose-Capillary: 150 mg/dL — ABNORMAL HIGH (ref 70–99)

## 2023-11-07 MED ORDER — ALPRAZOLAM 0.25 MG PO TABS
0.2500 mg | ORAL_TABLET | Freq: Every day | ORAL | Status: DC
  Administered 2023-11-07 – 2023-11-08 (×2): 0.25 mg via ORAL
  Filled 2023-11-07 (×2): qty 1

## 2023-11-07 MED ORDER — AZITHROMYCIN 250 MG PO TABS
250.0000 mg | ORAL_TABLET | Freq: Every day | ORAL | Status: DC
  Administered 2023-11-08 – 2023-11-09 (×2): 250 mg via ORAL
  Filled 2023-11-07 (×2): qty 1

## 2023-11-07 MED ORDER — METHYLPREDNISOLONE SODIUM SUCC 40 MG IJ SOLR
40.0000 mg | Freq: Every day | INTRAMUSCULAR | Status: DC
  Administered 2023-11-07 – 2023-11-09 (×3): 40 mg via INTRAVENOUS
  Filled 2023-11-07 (×3): qty 1

## 2023-11-07 MED ORDER — SODIUM CHLORIDE 0.9 % IV SOLN
2.0000 g | INTRAVENOUS | Status: DC
  Administered 2023-11-07 – 2023-11-08 (×2): 2 g via INTRAVENOUS
  Filled 2023-11-07 (×4): qty 20

## 2023-11-07 MED ORDER — AZITHROMYCIN 500 MG PO TABS
500.0000 mg | ORAL_TABLET | Freq: Every day | ORAL | Status: AC
  Administered 2023-11-07: 500 mg via ORAL
  Filled 2023-11-07: qty 1

## 2023-11-07 MED ORDER — IPRATROPIUM-ALBUTEROL 0.5-2.5 (3) MG/3ML IN SOLN
3.0000 mL | Freq: Three times a day (TID) | RESPIRATORY_TRACT | Status: DC
  Administered 2023-11-08 – 2023-11-09 (×5): 3 mL via RESPIRATORY_TRACT
  Filled 2023-11-07 (×5): qty 3

## 2023-11-07 NOTE — Progress Notes (Signed)
 The patient is admitted from ED to 2 A 41. A & O x 4. He denies any acute pain. The patient is oriented to his room,ascom/call bell and staff. Full assessment to epic completed. Will continue to monitor.

## 2023-11-07 NOTE — Assessment & Plan Note (Addendum)
 Follow-up with pulmonary as an outpatient.  Described the spiculated nodule so this could be an underlying cancerous process.

## 2023-11-07 NOTE — ED Notes (Signed)
 Pt coughing violently. PT o2 went down to 78% on 4l/min via [place]. RN turned pt up to 8l/min and o2 sat has improved and maintained WNL.

## 2023-11-07 NOTE — Assessment & Plan Note (Addendum)
 Continue Rocephin  and Zithromax  here and switch over to Augmentin  and Zithromax  upon discharge

## 2023-11-07 NOTE — Consult Note (Signed)
 PULMONOLOGY         Date: 11/07/2023,   MRN# [id number] Todd Whitehead [birth year]     AdmissionWeight: 50.6 kg                 CurrentWeight: 50.6 kg  Referring provider: Dr Josette   CHIEF COMPLAINT:   Acute hypoxemic respiratory failure   HISTORY OF PRESENT ILLNESS   This is a 78 year old male he has a history of essential hypertension, dyslipidemia lifelong smoking history centrilobular and paraseptal emphysema with COPD however has not been on oxygen chronically at home.  Also does have significant systolic CHF and coronary disease status post bypass as well as CKD.  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  He was found to be dyspneic and hypoxemic by daughter who requested EMS for ER evaluation. He denies using home oxygen.  He denies flu like illness or aspiration events. He does continue to smoke. He admits to constitutional symptoms with weight loss and fatigue. He was on 8L/min [place] on arrival and felt improved after oxygen therapy. He is being treated for CAP empirically.  PCCM consultation requested for further evaluation due to hypoxemia with recurrent admissions.   PAST MEDICAL HISTORY   Past Medical History:  Diagnosis Date   Anxiety    Arthritis    CHF (congestive heart failure) (HCC)    Colon polyp    COPD (chronic obstructive pulmonary disease) (HCC)    smoker   Dysrhythmia    Glaucoma    Heart murmur    Hypertension    on  no meds   Low serum vitamin B12 11/18/2020   Melanoma (HCC)    Resected from Left upper arm.    Myocardial infarction [facility]) 2017   Peripheral vascular disease    Prostate enlargement      SURGICAL HISTORY   Past Surgical History:  Procedure Laterality Date   BACK SURGERY  01/19/98   CARDIAC CATHETERIZATION N/A 08/19/2015   Procedure: Left Heart Cath and Coronary Angiography;  Surgeon: Vinie DELENA Jude, MD;  Location: [facility] INVASIVE CV LAB;  Service: Cardiovascular;  Laterality: N/A;   CHOLECYSTECTOMY  N/A 08/19/2016   Procedure: LAPAROSCOPIC CHOLECYSTECTOMY WITH INTRAOPERATIVE CHOLANGIOGRAM;  Surgeon: Aron Shoulders, MD;  Location: [facility] OR;  Service: General;  Laterality: N/A;   COLON SURGERY  age 35 days old    bowel was twisted up   COLONOSCOPY WITH PROPOFOL  N/A 03/19/2016   Procedure: COLONOSCOPY WITH PROPOFOL ;  Surgeon: Gladis RAYMOND Mariner, MD;  Location: [facility] ENDOSCOPY;  Service: Endoscopy;  Laterality: N/A;   CORONARY ARTERY BYPASS GRAFT  2017   ERCP N/A 08/18/2016   Procedure: ENDOSCOPIC RETROGRADE CHOLANGIOPANCREATOGRAPHY (ERCP);  Surgeon: Saintclair Jasper, MD;  Location: [facility] ENDOSCOPY;  Service: Gastroenterology;  Laterality: N/A;   ESOPHAGOGASTRODUODENOSCOPY (EGD) WITH PROPOFOL  N/A 09/18/2018   Procedure: ESOPHAGOGASTRODUODENOSCOPY (EGD) WITH PROPOFOL ;  Surgeon: Tye Millet, DO;  Location: [facility] ENDOSCOPY;  Service: General;  Laterality: N/A;   EYE SURGERY Left 03/26/11   FIBEROPTIC BRONCHOSCOPY Bilateral 03/18/2023   Procedure: BEDSIDE BRONCHOSCOPY FIBEROPTIC;  Surgeon: Parris Manna, MD;  Location: [facility] ORS;  Service: Thoracic;  Laterality: Bilateral;   FLEXIBLE BRONCHOSCOPY Bilateral 03/18/2023   Procedure: FLEXIBLE BRONCHOSCOPY;  Surgeon: Parris Manna, MD;  Location: [facility] ORS;  Service: Thoracic;  Laterality: Bilateral;   LOWER EXTREMITY ANGIOGRAPHY Left 10/03/2017   Procedure: LOWER EXTREMITY ANGIOGRAPHY;  Surgeon: Marea Selinda RAMAN, MD;  Location: [facility] INV[facility] CV [facility];  Service: Cardiovascular;  Laterality: Left;  LOWER EXTREMITY ANGIOGRAPHY Right 10/31/2017   Procedure: LOWER EXTREMITY ANGIOGRAPHY;  Surgeon: Marea Selinda RAMAN, MD;  Location: [facility] CV [facility];  Service: Cardiovascular;  Laterality: Right;   LUMBAR LAMINECTOMY/DECOMPRESSION MICRODISCECTOMY Right 10/02/2012   Procedure: Right Lumbar Five-Sacral One Lumbar laminotomy/Microdiskectomy;  Surgeon: Lamar LELON Peaches, MD;  Location: [facility] NEURO ORS;  Service: Neurosurgery;  Laterality: Right;  Right Lumbar Five-Sacral One Lumbar  laminotomy/Microdiskectomy   VISCERAL ANGIOGRAPHY N/A 11/08/2020   Procedure: VISCERAL ANGIOGRAPHY;  Surgeon: Jama Cordella MATSU, MD;  Location: [facility] INV[facility] CV [facility];  Service: Cardiovascular;  Laterality: N/A;   VISCERAL ANGIOGRAPHY N/A 11/25/2020   Procedure: VISCERAL ANGIOGRAPHY;  Surgeon: Jama Cordella MATSU, MD;  Location: [facility] INV[facility] CV LAB;  Service: Cardiovascular;  Laterality: N/A;     FAMILY HISTORY   Family History  Problem Relation Age of Onset   Heart attack Father    Aneurysm Brother    Lung cancer Brother      SOCIAL HISTORY   Social History   Tobacco Use   Smoking status: Every Day    Current packs/day: 1.00    Average packs/day: 1 pack/day for 58.0 years (58.0 ttl pk-yrs)    Types: Cigarettes   Smokeless tobacco: Never  Vaping Use   Vaping status: Former  Substance Use Topics   Alcohol use: No   Drug use: No     MEDICATIONS    Home Medication:  Current Outpatient Rx   Order #: [id number] Class: Normal   Order #: [id number] Class: Historical Med   Order #: [id number] Class: Historical Med   Order #: [id number] Class: Historical Med   Order #: [id number] Class: Historical Med   Order #: [id number] Class: Historical Med   Order #: [id number] Class: Historical Med   Order #: [id number] Class: Normal   Order #: [id number] Class: Print   Order #: [id number] Class: Historical Med   Order #: [id number] Class: Historical Med   Order #: [id number] Class: Print   Order #: [id number] Class: Historical Med   Order #: [id number] Class: Historical Med   Order #: [id number] Class: Normal   Order #: [id number] Class: Print   Order #: [id number] Class: Historical Med   Order #: [id number] Class: Historical Med   Order #: [id number] Class: Historical Med   Order #: [id number] Class: Normal    Current Medication:  Current Facility-Administered Medications:    acetaminophen  (TYLENOL ) tablet 650 mg, 650 mg, Oral, Q6H PRN **OR** acetaminophen  (TYLENOL ) suppository 650 mg, 650 mg, Rectal, Q6H PRN,  Todd Delayne GAILS, MD   albuterol  (PROVENTIL ) (2.5 MG/3ML) 0.083% nebulizer solution 2.5 mg, 2.5 mg, Nebulization, Q2H PRN, Todd Delayne GAILS, MD   aspirin  EC tablet 81 mg, 81 mg, Oral, Daily, Todd Delayne V, MD, 81 mg at 11/07/23 0903   atorvastatin  (LIPITOR) tablet 40 mg, 40 mg, Oral, Daily, Todd Delayne V, MD, 40 mg at 11/07/23 9095   busPIRone  (BUSPAR ) tablet 30 mg, 30 mg, Oral, BID, Todd Delayne V, MD, 30 mg at 11/07/23 9096   cefTRIAXone  (ROCEPHIN ) 1 g in sodium chloride  0.9 % 100 mL IVPB, 1 g, Intravenous, Q24H, Todd Delayne GAILS, MD, Stopped at 11/06/23 2340   citalopram  (CELEXA ) tablet 20 mg, 20 mg, Oral, Daily, Todd Delayne V, MD, 20 mg at 11/07/23 9095   clopidogrel  (PLAVIX ) tablet 75 mg, 75 mg, Oral, Daily, Todd Delayne V, MD, 75 mg at 11/07/23 9093   dapagliflozin  propanediol (FARXIGA ) tablet 10 mg, 10 mg, Oral, QAC breakfast, Todd Delayne V, MD, 10 mg at 11/07/23 9096   enoxaparin  (LOVENOX ) injection 30 mg, 30 mg, Subcutaneous, Q24H, Todd Delayne GAILS, MD,  30 mg at 11/06/23 2200   guaiFENesin  (MUCINEX ) 12 hr tablet 600 mg, 600 mg, Oral, BID, Duncan, Hazel V, MD, 600 mg at 11/07/23 9095   HYDROcodone -acetaminophen  (NORCO/VICODIN) 5-325 MG per tablet 1-2 tablet, 1-2 tablet, Oral, Q4H PRN, Todd Delayne GAILS, MD   insulin  aspart (novoLOG ) injection 0-15 Units, 0-15 Units, Subcutaneous, TID WC, Todd Delayne GAILS, MD, 3 Units at 11/07/23 1228   insulin  aspart (novoLOG ) injection 0-5 Units, 0-5 Units, Subcutaneous, QHS, Todd, Delayne GAILS, MD   ipratropium-albuterol  (DUONEB) 0.5-2.5 (3) MG/3ML nebulizer solution 3 mL, 3 mL, Nebulization, Q6H, Todd Delayne GAILS, MD, 3 mL at 11/07/23 0905   latanoprost  (XALATAN ) 0.005 % ophthalmic solution 1 drop, 1 drop, Both Eyes, QHS, Wieting, Richard, MD   methylPREDNISolone  sodium succinate (SOLU-MEDROL ) 40 mg/mL injection 40 mg, 40 mg, Intravenous, Daily, Josette, Richard, MD, 40 mg at 11/07/23 9095   metoprolol  succinate (TOPROL -XL) 24 hr tablet 12.5 mg, 12.5 mg,  Oral, Daily, Todd Delayne V, MD, 12.5 mg at 11/07/23 9095   nicotine  (NICODERM CQ  - dosed in mg/24 hours) patch 21 mg, 21 mg, Transdermal, Daily, Todd Delayne V, MD, 21 mg at 11/06/23 0909   ondansetron  (ZOFRAN ) tablet 4 mg, 4 mg, Oral, Q6H PRN **OR** ondansetron  (ZOFRAN ) injection 4 mg, 4 mg, Intravenous, Q6H PRN, Todd Delayne GAILS, MD   QUEtiapine  (SEROQUEL ) tablet 50 mg, 50 mg, Oral, QHS, Wieting, Richard, MD, 50 mg at 11/06/23 2118   sacubitril -valsartan  (ENTRESTO ) 24-26 mg per tablet, 1 tablet, Oral, BID, Josette, Richard, MD, 1 tablet at 11/07/23 9096   tamsulosin  (FLOMAX ) capsule 0.4 mg, 0.4 mg, Oral, QPC breakfast, Todd Delayne V, MD, 0.4 mg at 11/07/23 9095   traZODone  (DESYREL ) tablet 50 mg, 50 mg, Oral, QHS, Todd Delayne V, MD, 50 mg at 11/06/23 2117   varenicline (CHANTIX) tablet 0.5 mg, 0.5 mg, Oral, BID, Wieting, Richard, MD, 0.5 mg at 11/07/23 9096  Current Outpatient Medications:    acetaminophen  (TYLENOL ) 500 MG tablet, Take 1 tablet (500 mg total) by mouth every 6 (six) hours as needed. (Patient taking differently: Take 1,000 mg by mouth every 6 (six) hours as needed.), Disp: 30 tablet, Rfl: 0   ALPRAZolam  (XANAX ) 0.25 MG tablet, Take 0.25 mg by mouth at bedtime as needed for sleep., Disp: , Rfl:    aspirin  EC 81 MG tablet, Take 81 mg by mouth daily. Swallow whole., Disp: , Rfl:    atorvastatin  (LIPITOR) 40 MG tablet, Take 40 mg by mouth daily. , Disp: , Rfl:    brimonidine  (ALPHAGAN  P) 0.1 % SOLN, Place 1 drop into both eyes 2 (two) times daily., Disp: , Rfl:    busPIRone  (BUSPAR ) 30 MG tablet, Take 30 mg by mouth 2 (two) times daily., Disp: , Rfl:    citalopram  (CELEXA ) 20 MG tablet, Take 20 mg by mouth daily., Disp: , Rfl:    clopidogrel  (PLAVIX ) 75 MG tablet, TAKE 1 TABLET(75 MG) BY MOUTH DAILY, Disp: 30 tablet, Rfl: 5   dapagliflozin  propanediol (FARXIGA ) 10 MG TABS tablet, Take 1 tablet (10 mg total) by mouth daily before breakfast., Disp: 90 tablet, Rfl: 3   latanoprost   (XALATAN ) 0.005 % ophthalmic solution, Place 1 drop into both eyes at bedtime. , Disp: , Rfl: 2   metoprolol  succinate (TOPROL -XL) 25 MG 24 hr tablet, Take 12.5 mg by mouth daily., Disp: , Rfl:    Multiple Vitamin (MULTIVITAMIN WITH MINERALS) TABS tablet, Take 1 tablet by mouth daily., Disp: 30 tablet, Rfl: 0   Multiple Vitamins-Minerals (CENTRUM ADULT  50+ MULTIGUMMIES PO), Take 1 tablet by mouth daily., Disp: , Rfl:    QUEtiapine  (SEROQUEL ) 50 MG tablet, Take 50 mg by mouth at bedtime., Disp: , Rfl:    sacubitril -valsartan  (ENTRESTO ) 24-26 MG, Take 1 tablet by mouth 2 (two) times daily., Disp: 180 tablet, Rfl: 3   tamsulosin  (FLOMAX ) 0.4 MG CAPS capsule, Take 1 capsule (0.4 mg total) by mouth daily after breakfast., Disp: 30 capsule, Rfl: 2   traMADol  (ULTRAM ) 50 MG tablet, Take 25-50 mg by mouth 3 (three) times daily as needed., Disp: , Rfl:    traZODone  (DESYREL ) 50 MG tablet, Take 50 mg by mouth at bedtime., Disp: , Rfl:    varenicline (CHANTIX) 0.5 MG tablet, Take 0.5 mg by mouth 2 (two) times daily., Disp: , Rfl:    ferrous sulfate  325 (65 FE) MG EC tablet, TAKE 1 TABLET(325 MG) BY MOUTH TWICE DAILY WITH A MEAL (Patient not taking: Reported on 11/06/2023), Disp: 180 tablet, Rfl: 1    ALLERGIES   Other, Poison ivy extract, and Poison oak extract     REVIEW OF SYSTEMS    Review of Systems:  Gen:  Denies  fever, sweats, chills weigh loss  HEENT: Denies blurred vision, double vision, ear pain, eye pain, hearing loss, nose bleeds, sore throat Cardiac:  No dizziness, chest pain or heaviness, chest tightness,edema Resp:   reports dyspnea chronically  Gi: Denies swallowing difficulty, stomach pain, nausea or vomiting, diarrhea, constipation, bowel incontinence Gu:  Denies bladder incontinence, burning urine Ext:   Denies Joint pain, stiffness or swelling Skin: Denies  skin rash, easy bruising or bleeding or hives Endoc:  Denies polyuria, polydipsia , polyphagia or weight change Psych:    Denies depression, insomnia or hallucinations   Other:  All other systems negative   VS: BP (!) 114/58   Pulse 79   Temp 98.3 F (36.8 C) (Oral)   Resp (!) 23   Ht 5' 8 (1.727 m)   Wt 50.6 kg   SpO2 95%   BMI 16.96 kg/m      PHYSICAL EXAM    GENERAL:NAD, no fevers, chills, no weakness no fatigue HEAD: Normocephalic, atraumatic.  EYES: Pupils equal, round, reactive to light. Extraocular muscles intact. No scleral icterus.  MOUTH: Moist mucosal membrane. Dentition intact. No abscess noted.  EAR, NOSE, THROAT: Clear without exudates. No external lesions.  NECK: Supple. No thyromegaly. No nodules. No JVD.  PULMONARY: decreased breath sounds with mild rhonchi worse at bases bilaterally.  CARDIOVASCULAR: S1 and S2. Regular rate and rhythm. No murmurs, rubs, or gallops. No edema. Pedal pulses 2+ bilaterally.  GASTROINTESTINAL: Soft, nontender, nondistended. No masses. Positive bowel sounds. No hepatosplenomegaly.  MUSCULOSKELETAL: No swelling, clubbing, or edema. Range of motion full in all extremities.  NEUROLOGIC: Cranial nerves II through XII are intact. No gross focal neurological deficits. Sensation intact. Reflexes intact.  SKIN: No ulceration, lesions, rashes, or cyanosis. Skin warm and dry. Turgor intact.  PSYCHIATRIC: Mood, affect within normal limits. The patient is awake, alert and oriented x 3. Insight, judgment intact.       IMAGING    Narrative & Impression  CLINICAL DATA:  Lung nodule * Tracking Code: BO *   EXAM: CT CHEST WITHOUT CONTRAST   TECHNIQUE: Multidetector CT imaging of the chest was performed following the standard protocol without IV contrast.   RADIATION DOSE REDUCTION: This exam was performed according to the departmental dose-optimization program which includes automated exposure control, adjustment of the mA and/or kV according to patient  size and/or use of iterative reconstruction technique.   COMPARISON:  03/15/2023    FINDINGS: Cardiovascular: Aortic atherosclerosis. Normal heart size. Extensive three-vessel coronary artery calcifications. No pericardial effusion.   Mediastinum/Nodes: No enlarged mediastinal, hilar, or axillary lymph nodes. Thyroid  gland, trachea, and esophagus demonstrate no significant findings.   Lungs/Pleura: Severe emphysema. Diffuse bilateral bronchial wall thickening. Extensive heterogeneous and consolidative airspace opacity throughout the right lung base and to a lesser extent the left lung base (series 4, image 139). Spiculated nodule in the peripheral left lower lobe measuring 0.9 x 0.6 cm unchanged (series 4, image 129) no pleural effusion or pneumothorax.   Upper Abdomen: No acute abnormality.   Musculoskeletal: No chest wall abnormality. No acute osseous findings.   IMPRESSION: 1. Extensive heterogeneous and consolidative airspace opacity throughout the right lung base and to a lesser extent the left lung base, consistent with infection or aspiration. 2. Spiculated nodule in the peripheral left lower lobe measuring 0.9 x 0.6 cm unchanged. This has increased in size and solid character over multiple prior examinations and is highly suspicious for a small enlarging primary lung malignancy. 3. Severe emphysema and diffuse bilateral bronchial wall thickening. 4. Coronary artery disease.   Aortic Atherosclerosis (ICD10-I70.0) and Emphysema (ICD10-J43.9).     Electronically Signed   By: Marolyn JONETTA Jaksch M.D.   On: 11/07/2023 14:12      ASSESSMENT/PLAN   Acute on chronic hypoxemic respiratory failure                   -  s/p CT chest with Right lower lung consolidated infiltrate                   - agree with Rocephin  and Zithromax  therapy                    - Chest physiotherapy with incentive spirometry                    - PT/OT when able   2. Left lower lobe spiculated nodule              - outpatient workup for lung cancer  3. Acute exacerbation of  COPD          - agree with steroids and nebulizer therapy           - currently on solumedrol and duoneb  4. Bibasilar Atelectasis               - incentive spirometry  5. Tobacco dependence            Nicotine  replacement therapy    6. Physical deconditioning with CKD and CHF           Contributing to dyspnea - patient would benefit from pulmonary and cardiac rehab on outpatient            -patient is DNR and DNI with overall poor prognosis            - BODE score >8 with <20% 5 year survival          Thank you for allowing me to participate in the care of this patient.   Patient/Family are satisfied with care plan and all questions have been answered.    Provider disclosure: Patient with at least one acute or chronic illness or injury that poses a threat to life or bodily function and is being managed actively during this encounter.  All of the below  services have been performed independently by signing provider:  review of prior documentation from internal and or external health records.  Review of previous and current lab results.  Interview and comprehensive assessment during patient visit today. Review of current and previous chest radiographs/CT scans. Discussion of management and test interpretation with health care team and patient/family.   This document was prepared using Dragon voice recognition software and may include unintentional dictation errors.     Zaia Carre, M.D.  Division of Pulmonary & Critical Care Medicine

## 2023-11-07 NOTE — Progress Notes (Signed)
 Progress Note   Patient: Todd Whitehead FMW:[id number] DOB: 03/31/1945 DOA: 11/05/2023     1 DOS: the patient was seen and examined on 11/07/2023   Brief hospital course: 78 y.o. 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  He presented by EMS with a 3-day history of wheezing and increased work of breathing have not improved with home meds.  He refused CPAP en route.  He denies chest pain, fever or chills.  Denies lower extremity pain or swelling. In the ED, tachypneic to mid 20s to low 30s requiring 6 L HFNC to maintain sats in the mid 90s.  Vitals otherwise unremarkable. Labs notable for WBC 20,000, With hemoglobin near baseline at 9.4.  Creatinine 1.75, up from baseline of 0.68 about 7 months prior. Respiratory viral panel negative Troponin 15 EKG showing sinus at 88(read as A-fib but looks regular). Chest x-ray showing symmetrically hyperinflated lungs and interstitial thickening consistent with underlying COPD and airway inflammation. Patient treated with DuoNebs, magnesium  sulfate, started on Rocephin  and azithromycin . Admission requested   10/8.  Patient breathing a little bit better today.  History of smoking.  States he was on Chantix at home.  Admitted with COPD exacerbation.  States he does not wear oxygen at home.  Assessment and Plan: * COPD with acute exacerbation (HCC) Acute on chronic respiratory failure with hypoxia Tobacco use disorder Patient requiring 8 L HFNC to maintain sats in the low to mid 90s-at baseline requires 2 to 3 L but noncompliant per last pulmonology note 08/2023 Continue Solu-Medrol , Rocephin  and add Zithromax , nebulizers Flutter valve and incentive  spirometer Nicotine  patch declined.  Chantix. Pulmonary consultation.  Multifocal pneumonia Patient already on Rocephin  will increase to 2 g daily.  Will add Zithromax .  NSVT (nonsustained ventricular tachycardia) (HCC) Asymptomatic.  Electrolytes okay.  Last echocardiogram showed a normal EF.  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  Today's creatinine down to 1.46.   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) PAD and carotid artery stenosis Chronic mesenteric stenosis s/p celiac stent No acute issues suspected at this time Continue aspirin  and statin  Diabetes mellitus, type II (HCC) Sliding scale insulin  coverage and Farxiga , sugars high with steroids.  Hypertension Continue home metoprolol   Underweight (BMI < 18.5) Possible complicating factor to overall prognosis  MGUS (monoclonal gammopathy of unknown significance) Followed by oncology  Lung nodule Follow-up with pulmonary as an outpatient.  Depression with anxiety Continue BuSpar , Celexa , Seroquel  and Xanax         Subjective: Patient on 8 L high flow nasal cannula.  States he is breathing better.  States he did not need oxygen at home previously.  Admitted with COPD exacerbation  Physical Exam: Vitals:   11/07/23 0901 11/07/23 0930 11/07/23 1123 11/07/23 1145  BP:  (!) 122/56  (!) 114/58  Pulse: (!) 106 83  79  Resp: (!) 28 (!) 26  (!) 23  Temp:   98.3 F (36.8 C)   TempSrc:   Oral   SpO2: (!) 83% 92%  95%  Weight:  Height:       Physical Exam HENT:     Head: Normocephalic.     Mouth/Throat:     Pharynx: No oropharyngeal exudate.  Eyes:     General: Lids are normal.     Conjunctiva/sclera: Conjunctivae normal.  Cardiovascular:     Rate and Rhythm: Normal rate and regular rhythm.     Heart sounds:  Normal heart sounds, S1 normal and S2 normal.  Pulmonary:     Breath sounds: Examination of the right-lower field reveals decreased breath sounds. Examination of the left-lower field reveals decreased breath sounds. Decreased breath sounds present. No wheezing, rhonchi or rales.  Abdominal:     Palpations: Abdomen is soft.     Tenderness: There is no abdominal tenderness.  Musculoskeletal:     Right lower leg: No swelling.     Left lower leg: No swelling.  Skin:    General: Skin is warm.     Findings: No rash.  Neurological:     Mental Status: He is alert and oriented to person, place, and time.     Data Reviewed: CT scan of the chest shows bilateral airspace opacities, spiculated nodule measuring 0.9 x 0.6 cm unchanged from previous. Creatinine 1.46, hemoglobin 9.1, platelet count 439, white blood cell count 38.1  Family Communication: Spoke with son on the phone  Disposition: Status is: Inpatient Remains inpatient appropriate because: Patient still on 8 L high flow nasal cannula.  Pulmonary consultation.  Starting Zithromax  with pneumonia.  Already on Rocephin  will increase the dose to 2 g  Planned Discharge Destination: Home    Time spent: 28 minutes  Author: Charlie Patterson, MD 11/07/2023 2:48 PM  For on call review [email or web address].

## 2023-11-07 NOTE — Progress Notes (Signed)
 Pt. Is accompanied by this RT and CT tech to CT scan on HFNC at 8L and back to his room without incident.

## 2023-11-08 DIAGNOSIS — J9621 Acute and chronic respiratory failure with hypoxia: Principal | ICD-10-CM

## 2023-11-08 DIAGNOSIS — J188 Other pneumonia, unspecified organism: Secondary | ICD-10-CM | POA: Diagnosis not present

## 2023-11-08 DIAGNOSIS — J441 Chronic obstructive pulmonary disease with (acute) exacerbation: Secondary | ICD-10-CM | POA: Diagnosis not present

## 2023-11-08 DIAGNOSIS — F17200 Nicotine dependence, unspecified, uncomplicated: Secondary | ICD-10-CM | POA: Diagnosis not present

## 2023-11-08 LAB — BASIC METABOLIC PANEL WITH GFR
Anion gap: 6 (ref 5–15)
BUN: 42 mg/dL — ABNORMAL HIGH (ref 8–23)
CO2: 25 mmol/L (ref 22–32)
Calcium: 8.8 mg/dL — ABNORMAL LOW (ref 8.9–10.3)
Chloride: 108 mmol/L (ref 98–111)
Creatinine, Ser: 1.3 mg/dL — ABNORMAL HIGH (ref 0.61–1.24)
GFR, Estimated: 56 mL/min — ABNORMAL LOW (ref >60)
Glucose, Bld: 127 mg/dL — ABNORMAL HIGH (ref 70–99)
Potassium: 4 mmol/L (ref 3.5–5.1)
Sodium: 139 mmol/L (ref 135–145)

## 2023-11-08 LAB — RESPIRATORY PANEL BY PCR

## 2023-11-08 LAB — GLUCOSE, CAPILLARY
Glucose-Capillary: 125 mg/dL — ABNORMAL HIGH (ref 70–99)
Glucose-Capillary: 152 mg/dL — ABNORMAL HIGH (ref 70–99)
Glucose-Capillary: 175 mg/dL — ABNORMAL HIGH (ref 70–99)
Glucose-Capillary: 187 mg/dL — ABNORMAL HIGH (ref 70–99)

## 2023-11-08 LAB — HEMOGLOBIN: Hemoglobin: 8.7 g/dL — ABNORMAL LOW (ref 13.0–17.0)

## 2023-11-08 MED ORDER — ENOXAPARIN SODIUM 40 MG/0.4ML IJ SOSY
40.0000 mg | PREFILLED_SYRINGE | INTRAMUSCULAR | Status: DC
  Administered 2023-11-08: 40 mg via SUBCUTANEOUS
  Filled 2023-11-08: qty 0.4

## 2023-11-08 NOTE — Progress Notes (Signed)
 Physical Therapy Treatment Patient Details Name: Todd Whitehead MRN: [id number] DOB: May 16, 1945 Today's Date: 11/08/2023   History of Present Illness Pt is a 78 y/o M presenting to ED with c/o SOB and productive cough. PMH signficant for COPD not compliant with home oxygen, CHF, DM, CAD s/p CABG, MGUS, PAD.    PT Comments  Pt performed dynamic standing balance without AD and then with RW at supervision level. Pt appeared more stable with RW but reports feeling more burdened when picking up walker to turn and sit. Edu. pt on how to turn without picking up RW.  SpO2 monitored throughout session with pt on 4L, 88-90% at rest, with activity desat to low 80s, returned to high 80s by end of session.  Continued PT will assist pt towards greater dynamic standing balance, LE strengthening, and activity tolerance to increase safety and independence and decrease burden of care with functional mobility.     If plan is discharge home, recommend the following: A little help with walking and/or transfers;A little help with bathing/dressing/bathroom;Assistance with cooking/housework;Assist for transportation;Help with stairs or ramp for entrance   Can travel by private vehicle     Yes  Equipment Recommendations  Rolling walker (2 wheels)    Recommendations for Other Services       Precautions / Restrictions Precautions Precautions: Fall Recall of Precautions/Restrictions: Impaired Restrictions Weight Bearing Restrictions Per Provider Order: No     Mobility  Bed Mobility Overal bed mobility: Modified Independent             General bed mobility comments: no physical assistance required    Transfers Overall transfer level: Needs assistance Equipment used: Rolling walker (2 wheels), None Transfers: Sit to/from Stand, Bed to chair/wheelchair/BSC Sit to Stand: Supervision   Step pivot transfers: Supervision       General transfer comment: performed without AD and with RW at  supervision level.  Pt appeared more stable with RW but reports feeling more burdened when picking up walker to turn and sit.  Edu. pt on how to turn without picking up RW.    Ambulation/Gait Ambulation/Gait assistance: Supervision Gait Distance (Feet): 24 Feet (x2) Assistive device: Rolling walker (2 wheels), None Gait Pattern/deviations: Step-through pattern, Trunk flexed, Narrow base of support Gait velocity: decreased     General Gait Details: no LOB, pt visibly fatigued by short bout of amb. Trunk flexed throughout.  Pt appears more stable with RW and less likely to furniture walk.   Stairs             Wheelchair Mobility     Tilt Bed    Modified Rankin (Stroke Patients Only)       Balance                                            Communication Communication Communication: No apparent difficulties  Cognition Arousal: Alert Behavior During Therapy: WFL for tasks assessed/performed   PT - Cognitive impairments: No apparent impairments                         Following commands: Intact      Cueing    Exercises Other Exercises Other Exercises: SpO2 monitored throughout session with pt on 4L, 88% at rest, with activity desat to low 80s, returned to high 80s by end of session    General  Comments        Pertinent Vitals/Pain Pain Assessment Pain Assessment: No/denies pain    Home Living                          Prior Function            PT Goals (current goals can now be found in the care plan section) Acute Rehab PT Goals Patient Stated Goal: to go home PT Goal Formulation: With patient Time For Goal Achievement: 11/20/23 Potential to Achieve Goals: Fair Progress towards PT goals: Progressing toward goals    Frequency    Min 2X/week      PT Plan      Co-evaluation              AM-PAC PT 6 Clicks Mobility   Outcome Measure  Help needed turning from your back to your side while in  a flat bed without using bedrails?: None Help needed moving from lying on your back to sitting on the side of a flat bed without using bedrails?: None Help needed moving to and from a bed to a chair (including a wheelchair)?: A Little Help needed standing up from a chair using your arms (e.g., wheelchair or bedside chair)?: A Little Help needed to walk in hospital room?: A Little Help needed climbing 3-5 steps with a railing? : A Lot 6 Click Score: 19    End of Session Equipment Utilized During Treatment: Oxygen Activity Tolerance: Patient limited by fatigue Patient left: in bed;with call bell/phone within reach Nurse Communication: Mobility status PT Visit Diagnosis: Unsteadiness on feet (R26.81);Other abnormalities of gait and mobility (R26.89);Muscle weakness (generalized) (M62.81);Difficulty in walking, not elsewhere classified (R26.2)     Time: 8874-8852 PT Time Calculation (min) (ACUTE ONLY): 22 min  Charges:    $Therapeutic Activity: 8-22 mins PT General Charges $$ ACUTE PT VISIT: 1 Visit                     Harland Irving, PTA  11/08/23, 12:21 PM

## 2023-11-08 NOTE — Care Management Important Message (Signed)
 Important Message  Patient Details  Name: Solon Alban MRN: [id number] Date of Birth: [birth year]   Important Message Given:  Yes - Medicare IM     Rojelio SHAUNNA Rattler 11/08/2023, 4:07 PM

## 2023-11-08 NOTE — TOC Initial Note (Signed)
 Transition of Care [facility]) - Initial/Assessment Note    Patient Details  Name: Todd Whitehead MRN: [id number] Date of Birth: Jun 21, 1945  Transition of Care Todd Whitehead) CM/SW Contact:    Todd [name], LCSW Phone Number: 11/08/2023, 11:13 AM  Clinical Narrative:  CSW met with patient. No family at bedside. CSW introduced role and explained that therapy recommendations would be discussed. Patient is not interested in SNF placement at this time but he is agreeable to home health services. Patient stated he will likely go to his daughter's home at discharge but he is unsure what her address is. CSW called her and left a voicemail to get this information. Patient does not have a home health agency preference. He confirmed he does not use oxygen at home. Will follow for this potential discharge need as well. No further concerns. CSW will continue to follow patient for support and facilitate return home once stable.               Expected Discharge Plan: Home w Home Health Services Barriers to Discharge: Continued Medical Work up   Patient Goals and CMS Choice            Expected Discharge Plan and Services     Post Acute Care Choice: Home Health Living arrangements for the past 2 months: Single Family Home                                      Prior Living Arrangements/Services Living arrangements for the past 2 months: Single Family Home Lives with:: Self Patient language and need for interpreter reviewed:: Yes Do you feel safe going back to the place where you live?: Yes      Need for Family Participation in Patient Care: Yes (Comment) Care giver support system in place?: Yes (comment)   Criminal Activity/Legal Involvement Pertinent to Current Situation/Hospitalization: No - Comment as needed  Activities of Daily Living   ADL Screening (condition at time of admission) Independently performs ADLs?: Yes (appropriate for developmental age) Is the patient deaf or have  difficulty hearing?: No Does the patient have difficulty seeing, even when wearing glasses/contacts?: No Does the patient have difficulty concentrating, remembering, or making decisions?: No  Permission Sought/Granted Permission sought to share information with : Facility Medical sales representative, Family Supports Permission granted to share information with : Yes, Verbal Permission Granted  Share Information with NAME: Todd Whitehead  Permission granted to share info w AGENCY: Home Health Agencies  Permission granted to share info w Relationship: [phone]  Permission granted to share info w Contact Information: Daughter  Emotional Assessment Appearance:: Appears stated age Attitude/Demeanor/Rapport: Engaged, Gracious Affect (typically observed): Accepting, Appropriate, Calm, Pleasant Orientation: : Oriented to Self, Oriented to Place, Oriented to  Time, Oriented to Situation Alcohol / Substance Use: Not Applicable Psych Involvement: No (comment)  Admission diagnosis:  Shortness of breath [R06.02] Respiratory distress [R06.03] COPD exacerbation (HCC) [J44.1] COPD with acute exacerbation (HCC) [J44.1] Acute on chronic hypoxic respiratory failure (HCC) [J96.21] Patient Active Problem List   Diagnosis Date Noted   NSVT (nonsustained ventricular tachycardia) (HCC) 11/06/2023   COPD exacerbation (HCC) 11/06/2023   COPD with acute exacerbation (HCC) 11/05/2023   Acute respiratory failure with hypoxia (HCC) 11/05/2023   AKI (acute kidney injury) 11/05/2023   GAD (generalized anxiety disorder) 11/05/2023   Underweight (BMI < 18.5) 11/05/2023   Multifocal pneumonia 03/16/2023   CKD (chronic kidney disease)  stage 3, GFR 30-59 ml/min (HCC) 08/27/2022   MGUS (monoclonal gammopathy of unknown significance) 05/17/2021   Chronic mesenteric ischemia 03/23/2021   Chronic HFrEF with improved EF (45%-> 55%) (heart failure with reduced ejection fraction) (HCC) 01/26/2021   Hypertensive urgency  01/26/2021   HLD (hyperlipidemia) 01/26/2021   COPD (chronic obstructive pulmonary disease) (HCC) 01/26/2021   Tobacco use disorder 01/26/2021   Hemoptysis 01/26/2021   Hypokalemia 01/26/2021   Lung nodule 01/26/2021   Elevated troponin 01/26/2021   Normocytic anemia 01/26/2021   Protein-calorie malnutrition, severe 01/26/2021   CAP (community acquired pneumonia) 01/26/2021   SOB (shortness of breath)    Hypoxia    Symptomatic anemia    CHF (congestive heart failure) (HCC) 12/09/2020   Anemia 11/18/2020   Low serum vitamin B12 11/18/2020   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   Hyperlipidemia 02/07/2018   Epistaxis 11/08/2017   PAD (peripheral artery disease) 09/28/2017   Hypertension 06/25/2017   Abdominal pain 06/25/2017   Aortic atherosclerosis 06/25/2017   Diabetes mellitus, type II (HCC) 12/24/2016   Bile duct calculus with acute cholecystitis 08/17/2016   Acute cholecystitis    Personal history of tobacco use, presenting hazards to health 07/08/2016   Health care maintenance 12/19/2015   Elevated LFTs 10/10/2015   Edema of right lower extremity 10/10/2015   COPD (chronic obstructive pulmonary disease) with chronic bronchitis (HCC) 10/04/2015   Depression with anxiety 09/11/2015   Carotid artery disease 09/09/2015   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) 08/22/2015   Benign non-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 03/21/2015   Chronic low back pain 03/21/2015   Depression, major, 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 [place] [address] Phone: [phone] Fax: [phone]     Social Drivers of Health (SDOH) Social History: SDOH Screenings   Food Insecurity: No Food Insecurity (11/07/2023)  Housing: Low Risk  (11/07/2023)  Transportation Needs: No Transportation Needs (11/07/2023)  Utilities: Not At Risk (11/07/2023)  Depression (PHQ2-9): Low Risk  (09/23/2023)  Financial Resource Strain: Low Risk  (04/05/2023)   Received from [facility]  Social Connections: Moderately Integrated (11/07/2023)  Tobacco Use: High Risk (10/07/2023)   Received from [facility]   SDOH Interventions:     Readmission Risk Interventions     No data to display

## 2023-11-08 NOTE — Progress Notes (Signed)
 Progress Note   Patient: Todd Whitehead FMW:[id number] DOB: 1945/09/23 DOA: 11/05/2023     2 DOS: the patient was seen and examined on 11/08/2023   Brief hospital course: 78 y.o. 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  He presented by EMS with a 3-day history of wheezing and increased work of breathing have not improved with home meds.  He refused CPAP en route.  He denies chest pain, fever or chills.  Denies lower extremity pain or swelling. In the ED, tachypneic to mid 20s to low 30s requiring 6 L HFNC to maintain sats in the mid 90s.  Vitals otherwise unremarkable. Labs notable for WBC 20,000, With hemoglobin near baseline at 9.4.  Creatinine 1.75, up from baseline of 0.68 about 7 months prior. Respiratory viral panel negative Troponin 15 EKG showing sinus at 88(read as A-fib but looks regular). Chest x-ray showing symmetrically hyperinflated lungs and interstitial thickening consistent with underlying COPD and airway inflammation. Patient treated with DuoNebs, magnesium  sulfate, started on Rocephin  and azithromycin . Admission requested   10/8.  Patient breathing a little bit better today.  History of smoking.  States he was on Chantix at home.  Admitted with COPD exacerbation.  States he does not wear oxygen at home. 10/9.  Patient on 8 L high flow nasal cannula.  CT scan of the chest showing pneumonia and spiculated nodule. 10/10.  Down to 4 L regular nasal cannula this morning.  Physical therapy recommending rehab but patient will want to go home.  Assessment and Plan: * COPD with acute exacerbation (HCC) Acute on chronic respiratory failure with hypoxia Tobacco use  disorder Patient requiring 8 L HFNC to maintain sats in the low to mid 90s on 10/9.  Patient down to 4 L regular nasal cannula today Continue Solu-Medrol , Rocephin  and  Zithromax , nebulizers Flutter valve and incentive spirometer Nicotine  patch declined.  Chantix. Pulmonary consultation appreciated  Multifocal pneumonia Continue Rocephin  and Zithromax   NSVT (nonsustained ventricular tachycardia) (HCC) Asymptomatic.  Electrolytes okay.  Last echocardiogram showed a normal EF.  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  Today's creatinine down to 1.30.   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) PAD and carotid artery stenosis Chronic mesenteric stenosis s/p celiac stent No acute issues suspected at this time Continue aspirin  and statin  Diabetes mellitus, type II (HCC) Sliding scale insulin  coverage and Farxiga , sugars high with steroids.  Hypertension Continue home metoprolol   Underweight (BMI < 18.5) Possible complicating factor to overall prognosis  MGUS (monoclonal gammopathy of unknown significance) Followed by oncology  Lung nodule Follow-up with pulmonary as an outpatient.  Depression with anxiety Continue BuSpar , Celexa , Seroquel  and Xanax         Subjective: Patient states he is feeling better.  Down to regular nasal cannula today 4 L.  Patient likely does not want to go home with oxygen and also refused rehab.  Physical Exam: Vitals:   11/08/23 0802 11/08/23 0830 11/08/23 1221 11/08/23 1541  BP:   109/61 109/65  Pulse:   76   Resp: 19  20  Temp:   97.8 F (36.6 C) 97.8 F (36.6 C)  TempSrc:   Oral Oral  SpO2:  95% 94% 92%  Weight:      Height:       Physical Exam HENT:     Head: Normocephalic.     Mouth/Throat:     Pharynx: No oropharyngeal exudate.   Eyes:     General: Lids are normal.     Conjunctiva/sclera: Conjunctivae normal.  Cardiovascular:     Rate and Rhythm: Normal rate and regular rhythm.     Heart sounds: Normal heart sounds, S1 normal and S2 normal.  Pulmonary:     Breath sounds: Examination of the right-lower field reveals decreased breath sounds. Examination of the left-lower field reveals decreased breath sounds. Decreased breath sounds present. No wheezing, rhonchi or rales.  Abdominal:     Palpations: Abdomen is soft.     Tenderness: There is no abdominal tenderness.  Musculoskeletal:     Right lower leg: No swelling.     Left lower leg: No swelling.  Skin:    General: Skin is warm.     Findings: No rash.  Neurological:     Mental Status: He is alert and oriented to person, place, and time.     Data Reviewed: Creatinine 1.3 with a GFR 56, hemoglobin 8 point  Family Communication: Spoke with son on the  Disposition: Status is: Inpatient Remains inpatient appropriate because: Continue to see if we can taper off oxygen.  Planned Discharge Destination: Home    Time spent: 28 minutes  Author: Charlie Patterson, MD 11/08/2023 3:50 PM  For on call review [email or web address].

## 2023-11-08 NOTE — Plan of Care (Signed)
  Problem: Skin Integrity: Goal: Risk for impaired skin integrity will decrease Outcome: Progressing   Problem: Education: Goal: Knowledge of disease or condition will improve Outcome: Progressing   Problem: Education: Goal: Knowledge of General Education information will improve Description: Including pain rating scale, medication(s)/side effects and non-pharmacologic comfort measures Outcome: Progressing   Problem: Pain Managment: Goal: General experience of comfort will improve and/or be controlled Outcome: Progressing   Problem: Respiratory: Goal: Ability to maintain a clear airway will improve Outcome: Not Progressing Goal: Levels of oxygenation will improve Outcome: Not Progressing

## 2023-11-08 NOTE — Progress Notes (Signed)
 PULMONOLOGY         Date: 11/08/2023,   MRN# [id number] Todd Whitehead July 10, 1945     AdmissionWeight: 50.6 kg                 CurrentWeight: 50.6 kg  Referring provider: Dr Josette   CHIEF COMPLAINT:   Acute hypoxemic respiratory failure   HISTORY OF PRESENT ILLNESS   This is a 78 year old male he has a history of essential hypertension, dyslipidemia lifelong smoking history centrilobular and paraseptal emphysema with COPD however has not been on oxygen chronically at home.  Also does have significant systolic CHF and coronary disease status post bypass as well as CKD.  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  He was found to be dyspneic and hypoxemic by daughter who requested EMS for ER evaluation. He denies using home oxygen.  He denies flu like illness or aspiration events. He does continue to smoke. He admits to constitutional symptoms with weight loss and fatigue. He was on 8L/min [place] on arrival and felt improved after oxygen therapy. He is being treated for CAP empirically.  PCCM consultation requested for further evaluation due to hypoxemia with recurrent admissions.   11/08/23-patient weaned to 4L/min [place].  Plan to continue working on weaning to room air. Renal function has improved overnight.  Viral workup is negative. He is on IV steroids with increased WBC count overnight.   PAST MEDICAL HISTORY   Past Medical History:  Diagnosis Date   Anxiety    Arthritis    CHF (congestive heart failure) (HCC)    Colon polyp    COPD (chronic obstructive pulmonary disease) (HCC)    smoker   Dysrhythmia    Glaucoma    Heart murmur    Hypertension    on  no meds   Low serum vitamin B12 11/18/2020   Melanoma (HCC)    Resected from Left upper arm.    Myocardial infarction [facility]) 2017   Peripheral vascular disease    Prostate enlargement      SURGICAL HISTORY   Past Surgical History:  Procedure Laterality Date   BACK SURGERY  01/19/98    CARDIAC CATHETERIZATION N/A 08/19/2015   Procedure: Left Heart Cath and Coronary Angiography;  Surgeon: Vinie DELENA Jude, MD;  Location: [facility] INVASIVE CV LAB;  Service: Cardiovascular;  Laterality: N/A;   CHOLECYSTECTOMY N/A 08/19/2016   Procedure: LAPAROSCOPIC CHOLECYSTECTOMY WITH INTRAOPERATIVE CHOLANGIOGRAM;  Surgeon: Aron Shoulders, MD;  Location: [facility] OR;  Service: General;  Laterality: N/A;   COLON SURGERY  age 26 days old    bowel was twisted up   COLONOSCOPY WITH PROPOFOL  N/A 03/19/2016   Procedure: COLONOSCOPY WITH PROPOFOL ;  Surgeon: Gladis RAYMOND Mariner, MD;  Location: [facility] ENDOSCOPY;  Service: Endoscopy;  Laterality: N/A;   CORONARY ARTERY BYPASS GRAFT  2017   ERCP N/A 08/18/2016   Procedure: ENDOSCOPIC RETROGRADE CHOLANGIOPANCREATOGRAPHY (ERCP);  Surgeon: Saintclair Jasper, MD;  Location: [facility] ENDOSCOPY;  Service: Gastroenterology;  Laterality: N/A;   ESOPHAGOGASTRODUODENOSCOPY (EGD) WITH PROPOFOL  N/A 09/18/2018   Procedure: ESOPHAGOGASTRODUODENOSCOPY (EGD) WITH PROPOFOL ;  Surgeon: Tye Millet, DO;  Location: [facility] ENDOSCOPY;  Service: General;  Laterality: N/A;   EYE SURGERY Left 03/26/11   FIBEROPTIC BRONCHOSCOPY Bilateral 03/18/2023   Procedure: BEDSIDE BRONCHOSCOPY FIBEROPTIC;  Surgeon: Parris Manna, MD;  Location: [facility] ORS;  Service: Thoracic;  Laterality: Bilateral;   FLEXIBLE BRONCHOSCOPY Bilateral 03/18/2023   Procedure: FLEXIBLE BRONCHOSCOPY;  Surgeon: Parris Manna, MD;  Location: [facility] ORS;  Service:  Thoracic;  Laterality: Bilateral;   LOWER EXTREMITY ANGIOGRAPHY Left 10/03/2017   Procedure: LOWER EXTREMITY ANGIOGRAPHY;  Surgeon: Marea Selinda RAMAN, MD;  Location: [facility] INV[facility]IVE CV LAB;  Service: Cardiovascular;  Laterality: Left;   LOWER EXTREMITY ANGIOGRAPHY Right 10/31/2017   Procedure: LOWER EXTREMITY ANGIOGRAPHY;  Surgeon: Marea Selinda RAMAN, MD;  Location: [facility] INV[facility] CV LAB;  Service: Cardiovascular;  Laterality: Right;   LUMBAR LAMINECTOMY/DECOMPRESSION MICRODISCECTOMY Right 10/02/2012    Procedure: Right Lumbar Five-Sacral One Lumbar laminotomy/Microdiskectomy;  Surgeon: Lamar LELON Peaches, MD;  Location: [facility] NEURO ORS;  Service: Neurosurgery;  Laterality: Right;  Right Lumbar Five-Sacral One Lumbar laminotomy/Microdiskectomy   VISCERAL ANGIOGRAPHY N/A 11/08/2020   Procedure: VISCERAL ANGIOGRAPHY;  Surgeon: Jama Cordella MATSU, MD;  Location: [facility] INV[facility] CV LAB;  Service: Cardiovascular;  Laterality: N/A;   VISCERAL ANGIOGRAPHY N/A 11/25/2020   Procedure: VISCERAL ANGIOGRAPHY;  Surgeon: Jama Cordella MATSU, MD;  Location: [facility] INV[facility] CV [facility];  Service: Cardiovascular;  Laterality: N/A;     FAMILY HISTORY   Family History  Problem Relation Age of Onset   Heart attack Father    Aneurysm Brother    Lung cancer Brother      SOCIAL HISTORY   Social History   Tobacco Use   Smoking status: Every Day    Current packs/day: 1.00    Average packs/day: 1 pack/day for 58.0 years (58.0 ttl pk-yrs)    Types: Cigarettes   Smokeless tobacco: Never  Vaping Use   Vaping status: Former  Substance Use Topics   Alcohol use: No   Drug use: No     MEDICATIONS    Home Medication:     Current Medication:  Current Facility-Administered Medications:    acetaminophen  (TYLENOL ) tablet 650 mg, 650 mg, Oral, Q6H PRN **OR** acetaminophen  (TYLENOL ) suppository 650 mg, 650 mg, Rectal, Q6H PRN, [name], MD   albuterol  (PROVENTIL ) (2.5 MG/3ML) 0.083% nebulizer solution 2.5 mg, 2.5 mg, Nebulization, Q2H PRN, [name], MD   ALPRAZolam  (XANAX ) tablet 0.25 mg, 0.25 mg, Oral, QHS, Wieting, Richard, MD, 0.25 mg at 11/07/23 2232   aspirin  EC tablet 81 mg, 81 mg, Oral, Daily, Cleatus Delayne V, MD, 81 mg at 11/08/23 9193   atorvastatin  (LIPITOR) tablet 40 mg, 40 mg, Oral, Daily, Cleatus Delayne V, MD, 40 mg at 11/08/23 0804   [COMPLETED] azithromycin  (ZITHROMAX ) tablet 500 mg, 500 mg, Oral, Daily, 500 mg at 11/07/23 1619 **FOLLOWED BY** azithromycin  (ZITHROMAX ) tablet 250 mg, 250 mg,  Oral, Daily, Wieting, Richard, MD, 250 mg at 11/08/23 9193   busPIRone  (BUSPAR ) tablet 30 mg, 30 mg, Oral, BID, [name], MD, 30 mg at 11/08/23 9195   cefTRIAXone  (ROCEPHIN ) 2 g in sodium chloride  0.9 % 100 mL IVPB, 2 g, Intravenous, Q24H, Wieting, Richard, MD, Last Rate: 200 mL/hr at 11/07/23 2309, 2 g at 11/07/23 2309   citalopram  (CELEXA ) tablet 20 mg, 20 mg, Oral, Daily, Cleatus Delayne V, MD, 20 mg at 11/08/23 0805   clopidogrel  (PLAVIX ) tablet 75 mg, 75 mg, Oral, Daily, Cleatus Delayne V, MD, 75 mg at 11/08/23 0806   dapagliflozin  propanediol (FARXIGA ) tablet 10 mg, 10 mg, Oral, QAC breakfast, Cleatus Delayne V, MD, 10 mg at 11/08/23 0807   enoxaparin  (LOVENOX ) injection 40 mg, 40 mg, Subcutaneous, Q24H, Patel, Kishan S, RPH   guaiFENesin  (MUCINEX ) 12 hr tablet 600 mg, 600 mg, Oral, BID, Cleatus Delayne V, MD, 600 mg at 11/08/23 0806   HYDROcodone -acetaminophen  (NORCO/VICODIN) 5-325 MG per tablet 1-2 tablet, 1-2 tablet, Oral, Q4H PRN,  [name], MD   insulin  aspart (novoLOG ) injection 0-15 Units, 0-15 Units, Subcutaneous, TID WC, [name], MD, 3 Units at 11/08/23 1232   insulin  aspart (novoLOG ) injection 0-5 Units, 0-5 Units, Subcutaneous, QHS, Cleatus, Delayne GAILS, MD   ipratropium-albuterol  (DUONEB) 0.5-2.5 (3) MG/3ML nebulizer solution 3 mL, 3 mL, Nebulization, TID, Josette, Richard, MD, 3 mL at 11/08/23 1359   latanoprost  (XALATAN ) 0.005 % ophthalmic solution 1 drop, 1 drop, Both Eyes, QHS, Wieting, Richard, MD, 1 drop at 11/07/23 2258   methylPREDNISolone  sodium succinate (SOLU-MEDROL ) 40 mg/mL injection 40 mg, 40 mg, Intravenous, Daily, Josette Ade, MD, 40 mg at 11/08/23 9193   metoprolol  succinate (TOPROL -XL) 24 hr tablet 12.5 mg, 12.5 mg, Oral, Daily, Cleatus Delayne V, MD, 12.5 mg at 11/08/23 0805   nicotine  (NICODERM CQ  - dosed in mg/24 hours) patch 21 mg, 21 mg, Transdermal, Daily, Cleatus Delayne V, MD, 21 mg at 11/06/23 0909   ondansetron  (ZOFRAN ) tablet 4 mg, 4 mg, Oral,  Q6H PRN **OR** ondansetron  (ZOFRAN ) injection 4 mg, 4 mg, Intravenous, Q6H PRN, [name], MD   QUEtiapine  (SEROQUEL ) tablet 50 mg, 50 mg, Oral, QHS, Wieting, Richard, MD, 50 mg at 11/07/23 2257   sacubitril -valsartan  (ENTRESTO ) 24-26 mg per tablet, 1 tablet, Oral, BID, Josette Ade, MD, 1 tablet at 11/08/23 9193   tamsulosin  (FLOMAX ) capsule 0.4 mg, 0.4 mg, Oral, QPC breakfast, Cleatus Delayne V, MD, 0.4 mg at 11/08/23 9194   traZODone  (DESYREL ) tablet 50 mg, 50 mg, Oral, QHS, [name], MD, 50 mg at 11/07/23 2231   varenicline (CHANTIX) tablet 0.5 mg, 0.5 mg, Oral, BID, Josette Ade, MD, 0.5 mg at 11/07/23 2257    ALLERGIES   Other, Poison ivy extract, and Poison oak extract     REVIEW OF SYSTEMS    Review of Systems:  Gen:  Denies  fever, sweats, chills weigh loss  HEENT: Denies blurred vision, double vision, ear pain, eye pain, hearing loss, nose bleeds, sore throat Cardiac:  No dizziness, chest pain or heaviness, chest tightness,edema Resp:   reports dyspnea chronically  Gi: Denies swallowing difficulty, stomach pain, nausea or vomiting, diarrhea, constipation, bowel incontinence Gu:  Denies bladder incontinence, burning urine Ext:   Denies Joint pain, stiffness or swelling Skin: Denies  skin rash, easy bruising or bleeding or hives Endoc:  Denies polyuria, polydipsia , polyphagia or weight change Psych:   Denies depression, insomnia or hallucinations   Other:  All other systems negative   VS: BP 109/61 (BP Location: Right Arm)   Pulse 76   Temp 97.8 F (36.6 C) (Oral)   Resp 19   Ht 5' 8 (1.727 m)   Wt 50.6 kg   SpO2 94%   BMI 16.96 kg/m      PHYSICAL EXAM    GENERAL:NAD, no fevers, chills, no weakness no fatigue HEAD: Normocephalic, atraumatic.  EYES: Pupils equal, round, reactive to light. Extraocular muscles intact. No scleral icterus.  MOUTH: Moist mucosal membrane. Dentition intact. No abscess noted.  EAR, NOSE, THROAT: Clear  without exudates. No external lesions.  NECK: Supple. No thyromegaly. No nodules. No JVD.  PULMONARY: decreased breath sounds with mild rhonchi worse at bases bilaterally.  CARDIOVASCULAR: S1 and S2. Regular rate and rhythm. No murmurs, rubs, or gallops. No edema. Pedal pulses 2+ bilaterally.  GASTROINTESTINAL: Soft, nontender, nondistended. No masses. Positive bowel sounds. No hepatosplenomegaly.  MUSCULOSKELETAL: No swelling, clubbing, or edema. Range of motion full in all extremities.  NEUROLOGIC: Cranial nerves II through XII  are intact. No gross focal neurological deficits. Sensation intact. Reflexes intact.  SKIN: No ulceration, lesions, rashes, or cyanosis. Skin warm and dry. Turgor intact.  PSYCHIATRIC: Mood, affect within normal limits. The patient is awake, alert and oriented x 3. Insight, judgment intact.       IMAGING    Narrative & Impression  CLINICAL DATA:  Lung nodule * Tracking Code: BO *   EXAM: CT CHEST WITHOUT CONTRAST   TECHNIQUE: Multidetector CT imaging of the chest was performed following the standard protocol without IV contrast.   RADIATION DOSE REDUCTION: This exam was performed according to the departmental dose-optimization program which includes automated exposure control, adjustment of the mA and/or kV according to patient size and/or use of iterative reconstruction technique.   COMPARISON:  03/15/2023   FINDINGS: Cardiovascular: Aortic atherosclerosis. Normal heart size. Extensive three-vessel coronary artery calcifications. No pericardial effusion.   Mediastinum/Nodes: No enlarged mediastinal, hilar, or axillary lymph nodes. Thyroid  gland, trachea, and esophagus demonstrate no significant findings.   Lungs/Pleura: Severe emphysema. Diffuse bilateral bronchial wall thickening. Extensive heterogeneous and consolidative airspace opacity throughout the right lung base and to a lesser extent the left lung base (series 4, image 139). Spiculated  nodule in the peripheral left lower lobe measuring 0.9 x 0.6 cm unchanged (series 4, image 129) no pleural effusion or pneumothorax.   Upper Abdomen: No acute abnormality.   Musculoskeletal: No chest wall abnormality. No acute osseous findings.   IMPRESSION: 1. Extensive heterogeneous and consolidative airspace opacity throughout the right lung base and to a lesser extent the left lung base, consistent with infection or aspiration. 2. Spiculated nodule in the peripheral left lower lobe measuring 0.9 x 0.6 cm unchanged. This has increased in size and solid character over multiple prior examinations and is highly suspicious for a small enlarging primary lung malignancy. 3. Severe emphysema and diffuse bilateral bronchial wall thickening. 4. Coronary artery disease.   Aortic Atherosclerosis (ICD10-I70.0) and Emphysema (ICD10-J43.9).     Electronically Signed   By: Marolyn JONETTA Jaksch M.D.   On: 11/07/2023 14:12      ASSESSMENT/PLAN   Acute on chronic hypoxemic respiratory failure                   -  s/p CT chest with Right lower lung consolidated infiltrate with pneumonia                   - agree with Rocephin  and Zithromax  therapy                    - Chest physiotherapy with incentive spirometry                    - PT/OT when able   2. Left lower lobe spiculated nodule              - outpatient workup for lung cancer  3. Acute exacerbation of COPD          - agree with steroids and nebulizer therapy           - currently on solumedrol and duoneb  4. Bibasilar Atelectasis               - incentive spirometry  5. Tobacco dependence            Nicotine  replacement therapy    6. Physical deconditioning with CKD and CHF           Contributing to dyspnea - patient  would benefit from pulmonary and cardiac rehab on outpatient            -patient is DNR and DNI with overall poor prognosis            - BODE score >8 with <20% 5 year survival          Thank you for  allowing me to participate in the care of this patient.   Patient/Family are satisfied with care plan and all questions have been answered.    Provider disclosure: Patient with at least one acute or chronic illness or injury that poses a threat to life or bodily function and is being managed actively during this encounter.  All of the below services have been performed independently by signing provider:  review of prior documentation from internal and or external health records.  Review of previous and current lab results.  Interview and comprehensive assessment during patient visit today. Review of current and previous chest radiographs/CT scans. Discussion of management and test interpretation with health care team and patient/family.   This document was prepared using Dragon voice recognition software and may include unintentional dictation errors.     Todd Whitehead, M.D.  Division of Pulmonary & Critical Care Medicine

## 2023-11-09 DIAGNOSIS — J9621 Acute and chronic respiratory failure with hypoxia: Secondary | ICD-10-CM | POA: Diagnosis not present

## 2023-11-09 DIAGNOSIS — F17200 Nicotine dependence, unspecified, uncomplicated: Secondary | ICD-10-CM | POA: Diagnosis not present

## 2023-11-09 DIAGNOSIS — J188 Other pneumonia, unspecified organism: Secondary | ICD-10-CM | POA: Diagnosis not present

## 2023-11-09 DIAGNOSIS — J441 Chronic obstructive pulmonary disease with (acute) exacerbation: Secondary | ICD-10-CM | POA: Diagnosis not present

## 2023-11-09 LAB — GLUCOSE, CAPILLARY
Glucose-Capillary: 127 mg/dL — ABNORMAL HIGH (ref 70–99)
Glucose-Capillary: 103 mg/dL — ABNORMAL HIGH (ref 70–99)

## 2023-11-09 MED ORDER — UMECLIDINIUM-VILANTEROL 62.5-25 MCG/ACT IN AEPB: 1.0000 | INHALATION_SPRAY | Freq: Every day | RESPIRATORY_TRACT | 0 refills | Status: DC

## 2023-11-09 MED ORDER — AZITHROMYCIN 250 MG PO TABS: ORAL_TABLET | ORAL | 0 refills | Status: DC

## 2023-11-09 MED ORDER — ALBUTEROL SULFATE HFA 108 (90 BASE) MCG/ACT IN AERS: 2.0000 | INHALATION_SPRAY | Freq: Four times a day (QID) | RESPIRATORY_TRACT | 0 refills | Status: AC | PRN

## 2023-11-09 MED ORDER — PREDNISONE 20 MG PO TABS: ORAL_TABLET | ORAL | 0 refills | Status: DC

## 2023-11-09 MED ORDER — AMOXICILLIN-POT CLAVULANATE 875-125 MG PO TABS
1.0000 | ORAL_TABLET | Freq: Two times a day (BID) | ORAL | 0 refills | Status: AC
Start: 2023-11-09 — End: 2023-11-12

## 2023-11-09 NOTE — Progress Notes (Signed)
 PULMONOLOGY         Date: 11/09/2023,   MRN# [id number] Todd Whitehead 1945/05/30     AdmissionWeight: 50.6 kg                 CurrentWeight: 50.6 kg  Referring provider: Dr Josette   CHIEF COMPLAINT:   Acute hypoxemic respiratory failure   HISTORY OF PRESENT ILLNESS   This is a 78 year old male he has a history of essential hypertension, dyslipidemia lifelong smoking history centrilobular and paraseptal emphysema with COPD however has not been on oxygen chronically at home.  Also does have significant systolic CHF and coronary disease status post bypass as well as CKD.  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  He was found to be dyspneic and hypoxemic by daughter who requested EMS for ER evaluation. He denies using home oxygen.  He denies flu like illness or aspiration events. He does continue to smoke. He admits to constitutional symptoms with weight loss and fatigue. He was on 8L/min [place] on arrival and felt improved after oxygen therapy. He is being treated for CAP empirically.  PCCM consultation requested for further evaluation due to hypoxemia with recurrent admissions.   11/08/23-patient weaned to 4L/min [place].  Plan to continue working on weaning to room air. Renal function has improved overnight.  Viral workup is negative. He is on IV steroids with increased WBC count overnight.   11/09/23- patient resting in bed in no distress. He is on 3L/min.  He requests home discharge and agrees to follow up on outpatient basis. Jesus met with attending physician and we discussed medical plan. Patient will have supplemental oxygen upon dc and continue CAP coverage.  We will see patient within 2 wks in office to confirm clinical improvement and set up bronchoscopy for spiculated left lung nodule.   PAST MEDICAL HISTORY   Past Medical History:  Diagnosis Date   Anxiety    Arthritis    CHF (congestive heart failure) (HCC)    Colon polyp    COPD (chronic  obstructive pulmonary disease) (HCC)    smoker   Dysrhythmia    Glaucoma    Heart murmur    Hypertension    on  no meds   Low serum vitamin B12 11/18/2020   Melanoma (HCC)    Resected from Left upper arm.    Myocardial infarction [facility]) 2017   Peripheral vascular disease    Prostate enlargement      SURGICAL HISTORY   Past Surgical History:  Procedure Laterality Date   BACK SURGERY  01/19/98   CARDIAC CATHETERIZATION N/A 08/19/2015   Procedure: Left Heart Cath and Coronary Angiography;  Surgeon: Vinie DELENA Jude, MD;  Location: [facility] INVASIVE CV LAB;  Service: Cardiovascular;  Laterality: N/A;   CHOLECYSTECTOMY N/A 08/19/2016   Procedure: LAPAROSCOPIC CHOLECYSTECTOMY WITH INTRAOPERATIVE CHOLANGIOGRAM;  Surgeon: Aron Shoulders, MD;  Location: [facility] OR;  Service: General;  Laterality: N/A;   COLON SURGERY  age 78 days old    bowel was twisted up   COLONOSCOPY WITH PROPOFOL  N/A 03/19/2016   Procedure: COLONOSCOPY WITH PROPOFOL ;  Surgeon: Gladis RAYMOND Mariner, MD;  Location: [facility] ENDOSCOPY;  Service: Endoscopy;  Laterality: N/A;   CORONARY ARTERY BYPASS GRAFT  2017   ERCP N/A 08/18/2016   Procedure: ENDOSCOPIC RETROGRADE CHOLANGIOPANCREATOGRAPHY (ERCP);  Surgeon: Saintclair Jasper, MD;  Location: [facility] ENDOSCOPY;  Service: Gastroenterology;  Laterality: N/A;   ESOPHAGOGASTRODUODENOSCOPY (EGD) WITH PROPOFOL  N/A 09/18/2018   Procedure: ESOPHAGOGASTRODUODENOSCOPY (EGD) WITH  PROPOFOL ;  Surgeon: Tye Millet, DO;  Location: [facility] ENDOSCOPY;  Service: General;  Laterality: N/A;   EYE SURGERY Left 03/26/11   FIBEROPTIC BRONCHOSCOPY Bilateral 03/18/2023   Procedure: BEDSIDE BRONCHOSCOPY FIBEROPTIC;  Surgeon: Parris Manna, MD;  Location: [facility] ORS;  Service: Thoracic;  Laterality: Bilateral;   FLEXIBLE BRONCHOSCOPY Bilateral 03/18/2023   Procedure: FLEXIBLE BRONCHOSCOPY;  Surgeon: Parris Manna, MD;  Location: [facility] ORS;  Service: Thoracic;  Laterality: Bilateral;   LOWER EXTREMITY ANGIOGRAPHY Left 10/03/2017    Procedure: LOWER EXTREMITY ANGIOGRAPHY;  Surgeon: Marea Selinda RAMAN, MD;  Location: [facility] CV [facility];  Service: Cardiovascular;  Laterality: Left;   LOWER EXTREMITY ANGIOGRAPHY Right 10/31/2017   Procedure: LOWER EXTREMITY ANGIOGRAPHY;  Surgeon: Marea Selinda RAMAN, MD;  Location: [facility];  Service: Cardiovascular;  Laterality: Right;   LUMBAR LAMINECTOMY/DECOMPRESSION MICRODISCECTOMY Right 10/02/2012   Procedure: Right Lumbar Five-Sacral One Lumbar laminotomy/Microdiskectomy;  Surgeon: Lamar LELON Peaches, MD;  Location: [facility] NEURO ORS;  Service: Neurosurgery;  Laterality: Right;  Right Lumbar Five-Sacral One Lumbar laminotomy/Microdiskectomy   VISCERAL ANGIOGRAPHY N/A 11/08/2020   Procedure: VISCERAL ANGIOGRAPHY;  Surgeon: Jama Cordella MATSU, MD;  Location: [facility];  Service: Cardiovascular;  Laterality: N/A;   VISCERAL ANGIOGRAPHY N/A 11/25/2020   Procedure: VISCERAL ANGIOGRAPHY;  Surgeon: Jama Cordella MATSU, MD;  Location: [facility];  Service: Cardiovascular;  Laterality: N/A;     FAMILY HISTORY   Family History  Problem Relation Age of Onset   Heart attack Father    Aneurysm Brother    Lung cancer Brother      SOCIAL HISTORY   Social History   Tobacco Use   Smoking status: Every Day    Current packs/day: 1.00    Average packs/day: 1 pack/day for 58.0 years (58.0 ttl pk-yrs)    Types: Cigarettes   Smokeless tobacco: Never  Vaping Use   Vaping status: Former  Substance Use Topics   Alcohol use: No   Drug use: No     MEDICATIONS    Home Medication:     Current Medication:  Current Facility-Administered Medications:    acetaminophen  (TYLENOL ) tablet 650 mg, 650 mg, Oral, Q6H PRN **OR** acetaminophen  (TYLENOL ) suppository 650 mg, 650 mg, Rectal, Q6H PRN, [name], MD   albuterol  (PROVENTIL ) (2.5 MG/3ML) 0.083% nebulizer solution 2.5 mg, 2.5 mg, Nebulization, Q2H PRN, [name], MD   ALPRAZolam  (XANAX ) tablet 0.25 mg, 0.25 mg, Oral,  QHS, Wieting, Richard, MD, 0.25 mg at 11/08/23 2140   aspirin  EC tablet 81 mg, 81 mg, Oral, Daily, Cleatus Delayne V, MD, 81 mg at 11/08/23 9193   atorvastatin  (LIPITOR) tablet 40 mg, 40 mg, Oral, Daily, Cleatus Delayne V, MD, 40 mg at 11/08/23 0804   [COMPLETED] azithromycin  (ZITHROMAX ) tablet 500 mg, 500 mg, Oral, Daily, 500 mg at 11/07/23 1619 **FOLLOWED BY** azithromycin  (ZITHROMAX ) tablet 250 mg, 250 mg, Oral, Daily, Wieting, Richard, MD, 250 mg at 11/08/23 9193   busPIRone  (BUSPAR ) tablet 30 mg, 30 mg, Oral, BID, Cleatus Delayne V, MD, 30 mg at 11/08/23 2140   cefTRIAXone  (ROCEPHIN ) 2 g in sodium chloride  0.9 % 100 mL IVPB, 2 g, Intravenous, Q24H, Wieting, Richard, MD, Last Rate: 200 mL/hr at 11/08/23 2320, 2 g at 11/08/23 2320   citalopram  (CELEXA ) tablet 20 mg, 20 mg, Oral, Daily, Cleatus Delayne V, MD, 20 mg at 11/08/23 0805   clopidogrel  (PLAVIX ) tablet 75 mg, 75 mg, Oral, Daily, [name], MD, 75 mg at 11/08/23 0806   dapagliflozin   propanediol (FARXIGA ) tablet 10 mg, 10 mg, Oral, QAC breakfast, [name], MD, 10 mg at 11/08/23 0807   enoxaparin  (LOVENOX ) injection 40 mg, 40 mg, Subcutaneous, Q24H, Tobie Cathaleen RAMAN, RPH, 40 mg at 11/08/23 2138   guaiFENesin  (MUCINEX ) 12 hr tablet 600 mg, 600 mg, Oral, BID, [name], MD, 600 mg at 11/08/23 2137   HYDROcodone -acetaminophen  (NORCO/VICODIN) 5-325 MG per tablet 1-2 tablet, 1-2 tablet, Oral, Q4H PRN, [name], MD   insulin  aspart (novoLOG ) injection 0-15 Units, 0-15 Units, Subcutaneous, TID WC, [name], MD, 3 Units at 11/08/23 1747   insulin  aspart (novoLOG ) injection 0-5 Units, 0-5 Units, Subcutaneous, QHS, Cleatus, Delayne GAILS, MD   ipratropium-albuterol  (DUONEB) 0.5-2.5 (3) MG/3ML nebulizer solution 3 mL, 3 mL, Nebulization, TID, Josette, Richard, MD, 3 mL at 11/09/23 0744   latanoprost  (XALATAN ) 0.005 % ophthalmic solution 1 drop, 1 drop, Both Eyes, QHS, Wieting, Richard, MD, 1 drop at 11/08/23 2146   methylPREDNISolone   sodium succinate (SOLU-MEDROL ) 40 mg/mL injection 40 mg, 40 mg, Intravenous, Daily, Josette Ade, MD, 40 mg at 11/08/23 9193   metoprolol  succinate (TOPROL -XL) 24 hr tablet 12.5 mg, 12.5 mg, Oral, Daily, Cleatus Delayne V, MD, 12.5 mg at 11/08/23 0805   nicotine  (NICODERM CQ  - dosed in mg/24 hours) patch 21 mg, 21 mg, Transdermal, Daily, Cleatus Delayne V, MD, 21 mg at 11/06/23 0909   ondansetron  (ZOFRAN ) tablet 4 mg, 4 mg, Oral, Q6H PRN **OR** ondansetron  (ZOFRAN ) injection 4 mg, 4 mg, Intravenous, Q6H PRN, [name], MD   QUEtiapine  (SEROQUEL ) tablet 50 mg, 50 mg, Oral, QHS, Josette Ade, MD, 50 mg at 11/08/23 2146   sacubitril -valsartan  (ENTRESTO ) 24-26 mg per tablet, 1 tablet, Oral, BID, Josette Ade, MD, 1 tablet at 11/08/23 2145   tamsulosin  (FLOMAX ) capsule 0.4 mg, 0.4 mg, Oral, QPC breakfast, Cleatus Delayne V, MD, 0.4 mg at 11/08/23 9194   traZODone  (DESYREL ) tablet 50 mg, 50 mg, Oral, QHS, [name], MD, 50 mg at 11/08/23 2140   varenicline (CHANTIX) tablet 0.5 mg, 0.5 mg, Oral, BID, Josette Ade, MD, 0.5 mg at 11/08/23 2139    ALLERGIES   Other, Poison ivy extract, and Poison oak extract     REVIEW OF SYSTEMS    Review of Systems:  Gen:  Denies  fever, sweats, chills weigh loss  HEENT: Denies blurred vision, double vision, ear pain, eye pain, hearing loss, nose bleeds, sore throat Cardiac:  No dizziness, chest pain or heaviness, chest tightness,edema Resp:   reports dyspnea chronically  Gi: Denies swallowing difficulty, stomach pain, nausea or vomiting, diarrhea, constipation, bowel incontinence Gu:  Denies bladder incontinence, burning urine Ext:   Denies Joint pain, stiffness or swelling Skin: Denies  skin rash, easy bruising or bleeding or hives Endoc:  Denies polyuria, polydipsia , polyphagia or weight change Psych:   Denies depression, insomnia or hallucinations   Other:  All other systems negative   VS: BP (!) 138/58 (BP Location: Right  Arm)   Pulse 86   Temp 98.2 F (36.8 C) (Oral)   Resp 19   Ht 5' 8 (1.727 m)   Wt 50.6 kg   SpO2 93%   BMI 16.96 kg/m      PHYSICAL EXAM    GENERAL:NAD, no fevers, chills, no weakness no fatigue HEAD: Normocephalic, atraumatic.  EYES: Pupils equal, round, reactive to light. Extraocular muscles intact. No scleral icterus.  MOUTH: Moist mucosal membrane. Dentition intact. No abscess noted.  EAR, NOSE, THROAT: Clear without exudates.  No external lesions.  NECK: Supple. No thyromegaly. No nodules. No JVD.  PULMONARY: decreased breath sounds with mild rhonchi worse at bases bilaterally.  CARDIOVASCULAR: S1 and S2. Regular rate and rhythm. No murmurs, rubs, or gallops. No edema. Pedal pulses 2+ bilaterally.  GASTROINTESTINAL: Soft, nontender, nondistended. No masses. Positive bowel sounds. No hepatosplenomegaly.  MUSCULOSKELETAL: No swelling, clubbing, or edema. Range of motion full in all extremities.  NEUROLOGIC: Cranial nerves II through XII are intact. No gross focal neurological deficits. Sensation intact. Reflexes intact.  SKIN: No ulceration, lesions, rashes, or cyanosis. Skin warm and dry. Turgor intact.  PSYCHIATRIC: Mood, affect within normal limits. The patient is awake, alert and oriented x 3. Insight, judgment intact.       IMAGING    Narrative & Impression  CLINICAL DATA:  Lung nodule * Tracking Code: BO *   EXAM: CT CHEST WITHOUT CONTRAST   TECHNIQUE: Multidetector CT imaging of the chest was performed following the standard protocol without IV contrast.   RADIATION DOSE REDUCTION: This exam was performed according to the departmental dose-optimization program which includes automated exposure control, adjustment of the mA and/or kV according to patient size and/or use of iterative reconstruction technique.   COMPARISON:  03/15/2023   FINDINGS: Cardiovascular: Aortic atherosclerosis. Normal heart size. Extensive three-vessel coronary artery  calcifications. No pericardial effusion.   Mediastinum/Nodes: No enlarged mediastinal, hilar, or axillary lymph nodes. Thyroid  gland, trachea, and esophagus demonstrate no significant findings.   Lungs/Pleura: Severe emphysema. Diffuse bilateral bronchial wall thickening. Extensive heterogeneous and consolidative airspace opacity throughout the right lung base and to a lesser extent the left lung base (series 4, image 139). Spiculated nodule in the peripheral left lower lobe measuring 0.9 x 0.6 cm unchanged (series 4, image 129) no pleural effusion or pneumothorax.   Upper Abdomen: No acute abnormality.   Musculoskeletal: No chest wall abnormality. No acute osseous findings.   IMPRESSION: 1. Extensive heterogeneous and consolidative airspace opacity throughout the right lung base and to a lesser extent the left lung base, consistent with infection or aspiration. 2. Spiculated nodule in the peripheral left lower lobe measuring 0.9 x 0.6 cm unchanged. This has increased in size and solid character over multiple prior examinations and is highly suspicious for a small enlarging primary lung malignancy. 3. Severe emphysema and diffuse bilateral bronchial wall thickening. 4. Coronary artery disease.   Aortic Atherosclerosis (ICD10-I70.0) and Emphysema (ICD10-J43.9).     Electronically Signed   By: Marolyn JONETTA Jaksch M.D.   On: 11/07/2023 14:12      ASSESSMENT/PLAN   Acute on chronic hypoxemic respiratory failure                   -  s/p CT chest with Right lower lung consolidated infiltrate with pneumonia                   - may dc on augmentin  bid x 5 d more                    - Chest physiotherapy with incentive spirometry                    - PT/OT when able   2. Left lower lobe spiculated nodule              - outpatient workup for lung cancer  3. Acute exacerbation of COPD          - agree with steroids and nebulizer therapy           -  currently on solumedrol and  duoneb  4. Bibasilar Atelectasis               - incentive spirometry  5. Tobacco dependence            Nicotine  replacement therapy    6. Physical deconditioning with CKD and CHF           Contributing to dyspnea - patient would benefit from pulmonary and cardiac rehab on outpatient            -patient is DNR and DNI with overall poor prognosis            - BODE score >8 with <20% 5 year survival          Thank you for allowing me to participate in the care of this patient.   Patient/Family are satisfied with care plan and all questions have been answered.    Provider disclosure: Patient with at least one acute or chronic illness or injury that poses a threat to life or bodily function and is being managed actively during this encounter.  All of the below services have been performed independently by signing provider:  review of prior documentation from internal and or external health records.  Review of previous and current lab results.  Interview and comprehensive assessment during patient visit today. Review of current and previous chest radiographs/CT scans. Discussion of management and test interpretation with health care team and patient/family.   This document was prepared using Dragon voice recognition software and may include unintentional dictation errors.     Persais Ethridge, M.D.  Division of Pulmonary & Critical Care Medicine

## 2023-11-09 NOTE — Progress Notes (Addendum)
 Pt. Requires 4LO2 to maintain SpO2 at or above 90% while at rest, and 7L O2 to maintain SpO2 at or above 90% while ambulating.   Patient 88% on 6L whil ambulating Can wear 4 L at rest and 6L with ambulation  Dr Josette

## 2023-11-09 NOTE — Discharge Summary (Signed)
 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  He presented by EMS with a 3-day history of wheezing and increased work of breathing have not improved with home meds.  He refused CPAP en route.  He denies chest pain, fever or chills.  Denies lower extremity pain or swelling. In the ED, tachypneic to mid 20s to low 30s requiring 6 L HFNC to maintain sats in the mid 90s.  Vitals otherwise unremarkable. Labs notable for WBC 20,000, With hemoglobin near baseline at 9.4.  Creatinine 1.75, up from baseline of 0.68 about 7 months  prior. Respiratory viral panel negative Troponin 15 EKG showing sinus at 88(read as A-fib but looks regular). Chest x-ray showing symmetrically hyperinflated lungs and interstitial thickening consistent with underlying COPD and airway inflammation. Patient treated with DuoNebs, magnesium  sulfate, started on Rocephin  and azithromycin . Admission requested   10/8.  Patient breathing a little bit better today.  History of smoking.  States he was on Chantix at home.  Admitted with COPD exacerbation.  States he does not wear oxygen at home. 10/9.  Patient on 8 L high flow nasal cannula.  CT scan of the chest showing pneumonia and spiculated nodule. 10/10.  Down to 4 L regular nasal cannula this morning.  Physical therapy recommending rehab but patient will want to go home. 10/11.  Patient wanting to go home today.  Agreeable to wear oxygen.  He will be on 4 L at rest and 6 L with ambulation.  Assessment and Plan: * COPD with acute exacerbation (HCC) Acute on chronic respiratory failure with hypoxia Tobacco use disorder Patient requiring 8 L HFNC to maintain sats in the low to mid 90s on 10/9.  Patient was on 3 L when I saw him.  Patient needed 4 L at rest and 6 L with ambulation to maintain saturations above 88%. Continue Solu-Medrol  here and switch over to prednisone  for 2 more days upon discharge. Rocephin  and  Zithromax  while here and Augmentin  and Zithromax  orally upon discharge to complete course. Flutter valve and incentive spirometer Nicotine  patch declined.  Chantix. Pulmonary consultation appreciated  Multifocal pneumonia Continue Rocephin   and Zithromax  here and switch over to Augmentin  and Zithromax  upon discharge  NSVT (nonsustained ventricular tachycardia) (HCC) Asymptomatic.  Electrolytes okay.  Last echocardiogram showed a normal EF.  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  Today's creatinine down to 1.30.  May have underlying chronic kidney disease stage IIIa.  Need to check creatinine as outpatient.   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  Last hemoglobin A1c actually low at 5.5  Hypertension Continue home metoprolol   Underweight (BMI < 18.5) Possible complicating factor to overall prognosis  MGUS (monoclonal gammopathy of unknown significance) Followed by oncology  Lung nodule Follow-up with pulmonary as an outpatient.  Described the spiculated nodule so this could be an underlying cancerous process.  Depression with anxiety Continue BuSpar , Celexa , Seroquel  and Xanax          Consultants: Pulmonary Procedures performed: None Disposition: Home health Diet recommendation:  Cardiac diet DISCHARGE MEDICATION: Allergies as of 11/09/2023       Reactions   Other Nausea And Vomiting   Cheese,butter,sour cream,cottage cheese   Poison Ivy Extract Rash   Poison Oak Extract Rash        Medication List     STOP taking these medications    ferrous sulfate  325 (65 FE) MG EC tablet       TAKE these medications    acetaminophen  500 MG tablet Commonly known as: TYLENOL  Take 1 tablet (500 mg total) by mouth every 6 (six) hours as needed. What changed: how much to take   albuterol  108 (90 Base) MCG/ACT inhaler Commonly known as: VENTOLIN  HFA Inhale 2 puffs into the lungs every 6 (six) hours as needed for wheezing or shortness of breath.   ALPRAZolam  0.25 MG tablet Commonly known as: XANAX  Take 0.25 mg by mouth at bedtime as needed for sleep.   amoxicillin -clavulanate 875-125 MG tablet Commonly known as:  AUGMENTIN  Take 1 tablet by mouth 2 (two) times daily for 3 days.   aspirin  EC 81 MG tablet Take 81 mg by mouth daily. Swallow whole.   atorvastatin  40 MG tablet Commonly known as: LIPITOR Take 40 mg by mouth daily.   azithromycin  250 MG tablet Commonly known as: ZITHROMAX  One tab daily for three days Start taking on: November 10, 2023   brimonidine  0.1 % Soln Commonly known as: ALPHAGAN  P Place 1 drop into both eyes 2 (two) times daily.   busPIRone  30 MG tablet Commonly known as: BUSPAR  Take 30 mg by mouth 2 (two) times daily.   CENTRUM ADULT 50+ MULTIGUMMIES PO Take 1 tablet by mouth daily.   citalopram  20 MG tablet Commonly known as: CELEXA  Take 20 mg by mouth daily.   clopidogrel  75 MG tablet Commonly known as: PLAVIX  TAKE 1 TABLET(75 MG) BY MOUTH DAILY   dapagliflozin  propanediol 10 MG Tabs tablet Commonly known as: Farxiga  Take 1 tablet (10 mg total) by mouth daily before breakfast.   latanoprost  0.005 % ophthalmic solution Commonly known as: XALATAN  Place 1 drop into both eyes at bedtime.   metoprolol  succinate 25 MG 24 hr tablet Commonly known as: TOPROL -XL Take 12.5 mg by mouth daily.  multivitamin with minerals Tabs tablet Take 1 tablet by mouth daily.   predniSONE  20 MG tablet Commonly known as: DELTASONE  2 tabs po daily for two days Start taking on: November 10, 2023   QUEtiapine  50 MG tablet Commonly known as: SEROQUEL  Take 50 mg by mouth at bedtime.   sacubitril -valsartan  24-26 MG Commonly known as: ENTRESTO  Take 1 tablet by mouth 2 (two) times daily.   tamsulosin  0.4 MG Caps capsule Commonly known as: FLOMAX  Take 1 capsule (0.4 mg total) by mouth daily after breakfast.   traMADol  50 MG tablet Commonly known as: ULTRAM  Take 25-50 mg by mouth 3 (three) times daily as needed.   traZODone  50 MG tablet Commonly known as: DESYREL  Take 50 mg by mouth at bedtime.   umeclidinium-vilanterol 62.5-25 MCG/ACT Aepb Commonly known as: ANORO  ELLIPTA Inhale 1 puff into the lungs daily.   varenicline 0.5 MG tablet Commonly known as: CHANTIX Take 0.5 mg by mouth 2 (two) times daily.               Durable Medical Equipment  (From admission, onward)           Start     Ordered   11/09/23 1127  For home use only DME Walker rolling  Once       Question Answer Comment  Walker: With 5 Inch Wheels   Patient needs a walker to treat with the following condition Generalized weakness      11/09/23 1126   11/09/23 1032  For home use only DME oxygen  Once       Comments: 4 L at rest and 6 L with ambulation  Question Answer Comment  Length of Need Lifetime   Mode or (Route) Nasal cannula   Liters per Minute 4   Frequency Continuous (stationary and portable oxygen unit needed)   Oxygen conserving device Yes   Oxygen delivery system Gas      11/09/23 1032            Follow-up Information     Lenon Layman ORN, MD Follow up in 5 day(s).   Specialty: Internal Medicine Contact information: [address] [facility] [place] [address] [phone]         Parris Manna, MD Follow up in 1 week(s).   Specialty: Pulmonary Disease Contact information: [address] [place] [address] [phone]                Discharge Exam: Fredricka Weights   11/05/23 1934  Weight: 50.6 kg   Physical Exam HENT:     Head: Normocephalic.     Mouth/Throat:     Pharynx: No oropharyngeal exudate.  Eyes:     General: Lids are normal.     Conjunctiva/sclera: Conjunctivae normal.  Cardiovascular:     Rate and Rhythm: Normal rate and regular rhythm.     Heart sounds: Normal heart sounds, S1 normal and S2 normal.  Pulmonary:     Breath sounds: Examination of the right-lower field reveals decreased breath sounds. Examination of the left-lower field reveals decreased breath sounds. Decreased breath sounds present. No wheezing, rhonchi or rales.  Abdominal:     Palpations: Abdomen  is soft.     Tenderness: There is no abdominal tenderness.  Musculoskeletal:     Right lower leg: No swelling.     Left lower leg: No swelling.  Skin:    General: Skin is warm.     Findings: No rash.  Neurological:     Mental Status: He is alert and oriented to person, place, and time.      Condition at discharge: stable  The results of significant diagnostics from this hospitalization (including imaging, microbiology, ancillary and laboratory) are listed below for reference.   Imaging Studies: CT CHEST WO CONTRAST Result Date: 11/07/2023 CLINICAL DATA:  Lung nodule * Tracking Code: BO * EXAM: CT CHEST WITHOUT CONTRAST TECHNIQUE: Multidetector CT imaging of the chest was performed following the standard protocol without IV contrast. RADIATION DOSE REDUCTION: This exam was performed according to the departmental dose-optimization program which includes automated exposure control, adjustment of the mA and/or kV according to patient size and/or use of iterative reconstruction technique. COMPARISON:  03/15/2023 FINDINGS: Cardiovascular: Aortic atherosclerosis. Normal heart size. Extensive three-vessel coronary artery calcifications. No pericardial effusion. Mediastinum/Nodes: No enlarged mediastinal, hilar, or axillary lymph nodes. Thyroid  gland, trachea, and esophagus demonstrate no significant findings. Lungs/Pleura: Severe emphysema. Diffuse bilateral bronchial wall thickening. Extensive heterogeneous and consolidative airspace opacity throughout the right lung base and to a lesser extent the left lung base (series 4, image 139). Spiculated nodule in the peripheral left lower lobe measuring 0.9 x 0.6 cm unchanged (series 4, image 129) no pleural effusion or pneumothorax. Upper Abdomen: No acute abnormality. Musculoskeletal: No chest wall abnormality. No acute osseous findings. IMPRESSION: 1. Extensive heterogeneous and consolidative airspace opacity throughout the right lung base and to a lesser  extent the left lung base, consistent with infection or aspiration. 2. Spiculated nodule in the peripheral left lower lobe measuring 0.9 x 0.6 cm unchanged. This has increased in size and solid character over multiple prior examinations and is highly suspicious for a small enlarging primary lung malignancy. 3. Severe emphysema and diffuse bilateral bronchial wall thickening. 4. Coronary artery disease. Aortic Atherosclerosis (ICD10-I70.0) and Emphysema (ICD10-J43.9). Electronically Signed   By: Marolyn JONETTA Jaksch M.D.   On: 11/07/2023 14:12   DG Chest Portable 1 View Result Date: 11/05/2023 EXAM: 1 VIEW XRAY OF THE CHEST 11/05/2023 07:50:38 PM COMPARISON: 03/18/2023 CLINICAL HISTORY: Shortness of breath. COPD, smoker FINDINGS: LUNGS AND PLEURA: The lungs are symmetrically hyperinflated in keeping with changes of underlying COPD. Interstitial thickening in keeping with airway inflammation noted. HEART AND MEDIASTINUM: Aortic atherosclerosis. CABG markers noted. Epicardial leads in place. Sternotomy hardware noted. BONES AND SOFT TISSUES: No acute osseous abnormality. IMPRESSION: 1. Symmetrically hyperinflated lungs and interstitial thickening, consistent with underlying COPD and airway inflammation. Electronically signed by: Dorethia Molt MD 11/05/2023 08:00 PM EDT RP Workstation: [id number]    Microbiology: Results for orders placed or performed during the hospital encounter of 11/05/23  Resp panel by RT-PCR (RSV, Flu A&B, Covid) Anterior Nasal Swab     Status: None   Collection Time: 11/05/23  8:00 PM   Specimen: Anterior Nasal Swab  Result Value Ref Range Status   SARS Coronavirus 2 by RT PCR NEGATIVE NEGATIVE Final    Comment: (NOTE) SARS-CoV-2 target nucleic acids are NOT DETECTED.  The SARS-CoV-2 RNA is generally detectable in upper respiratory specimens during the acute phase of infection. The lowest concentration of SARS-CoV-2 viral copies this assay can detect is 138 copies/mL. A negative  result does not preclude SARS-Cov-2 infection and should not be used as the sole basis for treatment or other patient management decisions. A negative result may occur with  improper specimen collection/handling, submission of specimen other than nasopharyngeal swab, presence of viral mutation(s) within the areas targeted by this assay, and inadequate number of viral copies(<138 copies/mL). A negative  result must be combined with clinical observations, patient history, and epidemiological information. The expected result is Negative.  Fact Sheet for Patients:  BloggerCourse.com  Fact Sheet for Healthcare Providers:  SeriousBroker.it  This test is no t yet approved or cleared by the [place]  FDA and  has been authorized for detection and/or diagnosis of SARS-CoV-2 by FDA under an Emergency Use Authorization (EUA). This EUA will remain  in effect (meaning this test can be used) for the duration of the COVID-19 declaration under Section 564(b)(1) of the Act, 21 U.S.C.section 360bbb-3(b)(1), unless the authorization is terminated  or revoked sooner.       Influenza A by PCR NEGATIVE NEGATIVE Final   Influenza B by PCR NEGATIVE NEGATIVE Final    Comment: (NOTE) The Xpert Xpress SARS-CoV-2/FLU/RSV plus assay is intended as an aid in the diagnosis of influenza from Nasopharyngeal swab specimens and should not be used as a sole basis for treatment. Nasal washings and aspirates are unacceptable for Xpert Xpress SARS-CoV-2/FLU/RSV testing.  Fact Sheet for Patients: BloggerCourse.com  Fact Sheet for Healthcare Providers: SeriousBroker.it  This test is not yet approved or cleared by the [place]  FDA and has been authorized for detection and/or diagnosis of SARS-CoV-2 by FDA under an Emergency Use Authorization (EUA). This EUA will remain in effect (meaning this test can be used)  for the duration of the COVID-19 declaration under Section 564(b)(1) of the Act, 21 U.S.C. section 360bbb-3(b)(1), unless the authorization is terminated or revoked.     Resp Syncytial Virus by PCR NEGATIVE NEGATIVE Final    Comment: (NOTE) Fact Sheet for Patients: BloggerCourse.com  Fact Sheet for Healthcare Providers: SeriousBroker.it  This test is not yet approved or cleared by the [place]  FDA and has been authorized for detection and/or diagnosis of SARS-CoV-2 by FDA under an Emergency Use Authorization (EUA). This EUA will remain in effect (meaning this test can be used) for the duration of the COVID-19 declaration under Section 564(b)(1) of the Act, 21 U.S.C. section 360bbb-3(b)(1), unless the authorization is terminated or revoked.  Performed at [facility], [address] [place], [place] [address]   Respiratory (~20 pathogens) panel by PCR     Status: None   Collection Time: 11/07/23 11:10 PM   Specimen: Nasopharyngeal Swab; Respiratory  Result Value Ref Range Status   Adenovirus NOT DETECTED NOT DETECTED Final   Coronavirus 229E NOT DETECTED NOT DETECTED Final    Comment: (NOTE) The Coronavirus on the Respiratory Panel, DOES NOT test for the novel  Coronavirus (2019 nCoV)    Coronavirus HKU1 NOT DETECTED NOT DETECTED Final   Coronavirus NL63 NOT DETECTED NOT DETECTED Final   Coronavirus OC43 NOT DETECTED NOT DETECTED Final   Metapneumovirus NOT DETECTED NOT DETECTED Final   Rhinovirus / Enterovirus NOT DETECTED NOT DETECTED Final   Influenza A NOT DETECTED NOT DETECTED Final   Influenza B NOT DETECTED NOT DETECTED Final   Parainfluenza Virus 1 NOT DETECTED NOT DETECTED Final   Parainfluenza Virus 2 NOT DETECTED NOT DETECTED Final   Parainfluenza Virus 3 NOT DETECTED NOT DETECTED Final   Parainfluenza Virus 4 NOT DETECTED NOT DETECTED Final   Respiratory Syncytial Virus NOT DETECTED NOT DETECTED  Final   Bordetella pertussis NOT DETECTED NOT DETECTED Final   Bordetella Parapertussis NOT DETECTED NOT DETECTED Final   Chlamydophila pneumoniae NOT DETECTED NOT DETECTED Final   Mycoplasma pneumoniae NOT DETECTED NOT DETECTED Final    Comment: Performed at [facility], [address], [place], [place] [address]    Labs: CBC: Recent Labs  Lab 11/05/23 2000 11/06/23 0221 11/07/23 0438 11/08/23 0510  WBC 20.4* 19.6* 38.1*  --   HGB 9.4* 9.2* 9.1* 8.7*  HCT 28.7* 28.7* 28.0*  --   MCV 89.1 91.1 89.2  --   PLT 374 385 439*  --    Basic Metabolic Panel: Recent Labs  Lab 11/05/23 2000 11/06/23 0221 11/07/23 0438 11/08/23 0510  NA 133* 134* 136 139  K 4.3 4.3 4.2 4.0  CL 101 103 105 108  CO2 21* 21* 22 25  GLUCOSE 153* 178* 167* 127*  BUN 36* 33* 40* 42*  CREATININE 1.75* 1.60* 1.46* 1.30*  CALCIUM  8.9 8.3* 8.6* 8.8*  MG  --  3.0*  --   --    Liver Function Tests: No results for input(s): AST, ALT, ALKPHOS, BILITOT, PROT, ALBUMIN  in the last 168 hours. CBG: Recent Labs  Lab 11/08/23 1219 11/08/23 1746 11/08/23 2108 11/09/23 0851 11/09/23 1200  GLUCAP 152* 175* 187* 127* 103*    Discharge time spent: greater than 30 minutes.  Signed: Charlie Patterson, MD [facility] 11/09/2023

## 2023-11-09 NOTE — Discharge Instructions (Addendum)
 Wear 4L oxygen all the time.  Can turn up to 6 L with ambulation but then turn back to 4 L at rest.

## 2023-11-09 NOTE — TOC Transition Note (Signed)
 Transition of Care [facility]) - Discharge Note   Patient Details  Name: Todd Whitehead MRN: [id number] Date of Birth: 02-04-1945  Transition of Care [facility]) CM/SW Contact:  Marinda Cooks, RN Phone Number: 11/09/2023, 11:35 AM   Clinical Narrative:    This CM updated by covering MD pt medically cleared to dc today and has active DC order . This CM spoke with Admission liaison [facility] arranged with [facility] . HH and DME referral sent and coordinated with [facility] for oxygen and RW.     DC transportation confirmed for pt with daughter [facility] team updated . No additional DC needs requested by medical team or identified by CM at this time .     Final next level of care: Home w Home Health Services Barriers to Discharge: No Barriers Identified   Patient Goals and CMS Choice Patient states their goals for this hospitalization and ongoing recovery are:: To dc to his daughgter's home and recive [facility] PT/OT CMS Medicare.gov Compare Post Acute Care list provided to:: Patient Choice offered to / list presented to : Patient      Discharge Placement                  Name of family member notified: Patient and Daughter Arline Murray Patient and family notified of of transfer: 11/09/23  Discharge Plan and Services Additional resources added to the After Visit Summary for       Post Acute Care Choice: Home Health          DME Arranged: Oxygen, Walker rolling DME Agency: [facility] Date DME Agency Contacted: 11/09/23 Time DME Agency Contacted: 0930 Representative spoke with at DME Agency: [facility] HH Arranged: RN, PT, OT HH Agency: [facility] Date [facility] Agency Contacted: 11/09/23 Time HH Agency Contacted: 1135 Representative spoke with at [facility] Agency: [place]  Social Drivers of Health (SDOH) Interventions SDOH Screenings   Food Insecurity: No Food Insecurity (11/07/2023)  Housing: Low Risk  (11/07/2023)  Transportation Needs: No Transportation Needs (11/07/2023)   Utilities: Not At Risk (11/07/2023)  Depression (PHQ2-9): Low Risk  (09/23/2023)  Financial Resource Strain: Low Risk  (04/05/2023)   Received from [facility]  Social Connections: Moderately Integrated (11/07/2023)  Tobacco Use: High Risk (10/07/2023)   Received from [facility]     Readmission Risk Interventions     No data to display

## 2023-11-09 NOTE — Plan of Care (Signed)
  Problem: Education: Goal: Knowledge of disease or condition will improve Outcome: Progressing   Problem: Education: Goal: Knowledge of General Education information will improve Description: Including pain rating scale, medication(s)/side effects and non-pharmacologic comfort measures Outcome: Progressing   Problem: Clinical Measurements: Goal: Cardiovascular complication will be avoided Outcome: Progressing   Problem: Pain Managment: Goal: General experience of comfort will improve and/or be controlled Outcome: Progressing   Problem: Health Behavior/Discharge Planning: Goal: Ability to manage health-related needs will improve Outcome: Not Progressing

## 2023-11-21 ENCOUNTER — Other Ambulatory Visit: Payer: Self-pay | Admitting: Pulmonary Disease

## 2023-11-25 ENCOUNTER — Other Ambulatory Visit: Payer: Self-pay | Admitting: Pulmonary Disease

## 2023-11-25 ENCOUNTER — Encounter: Payer: Self-pay | Admitting: Oncology

## 2023-11-25 DIAGNOSIS — R911 Solitary pulmonary nodule: Secondary | ICD-10-CM

## 2023-11-26 ENCOUNTER — Encounter
Admission: RE | Admit: 2023-11-26 | Discharge: 2023-11-26 | Disposition: A | Discharge status: Home / Self Care | Source: Ambulatory Visit | Attending: Pulmonary Disease | Admitting: Pulmonary Disease

## 2023-11-26 ENCOUNTER — Other Ambulatory Visit: Payer: Self-pay

## 2023-11-26 ENCOUNTER — Encounter: Payer: Self-pay | Admitting: Pulmonary Disease

## 2023-11-26 ENCOUNTER — Ambulatory Visit
Admission: RE | Admit: 2023-11-26 | Discharge: 2023-11-26 | Disposition: A | Discharge status: Home / Self Care | Source: Ambulatory Visit | Attending: Pulmonary Disease | Admitting: Pulmonary Disease

## 2023-11-26 DIAGNOSIS — R911 Solitary pulmonary nodule: Secondary | ICD-10-CM | POA: Insufficient documentation

## 2023-11-26 NOTE — Patient Instructions (Addendum)
 Your procedure is scheduled on: Friday 11/29/23 Report to the Registration Desk on the 1st floor of the [facility]. To find out your arrival time, please call [phone] between 1PM - 3PM on: Thursday 11/28/23 If your arrival time is 6:00 am, do not arrive before that time as the [facility] entrance doors do not open until 6:00 am.  REMEMBER: Instructions that are not followed completely may result in serious medical risk, up to and including death; or upon the discretion of your surgeon and anesthesiologist your surgery may need to be rescheduled.  Do not eat food or drink any liquids after midnight the night before surgery.  No gum chewing or hard candies.  One week prior to surgery: Stop Anti-inflammatories (NSAIDS) such as Advil, Aleve, Ibuprofen, Motrin, Naproxen, Naprosyn and Aspirin  based products such as Excedrin, Goody's Powder, BC Powder.  You may however, continue to take Tylenol  if needed for pain up until the day of surgery.  Stop ANY OVER THE COUNTER supplements until after surgery.  **Follow guidelines for insulin  and diabetes medications.** Stop Farxiga  today  **Follow recommendations regarding stopping blood thinners.** Plavix  and Aspirin  held since Monday 11/25/23  Continue taking all of your other prescription medications up until the day of surgery.  ON THE DAY OF SURGERY ONLY TAKE THESE MEDICATIONS WITH SIPS OF WATER:  atorvastatin  (LIPITOR) 40 MG tablet  busPIRone  (BUSPAR ) 30 MG tablet  citalopram  (CELEXA ) 20 MG tablet  metoprolol  succinate (TOPROL -XL) 12.5 MG 24 hr tablet   Use inhalers on the day of surgery and bring to the hospital.  No Alcohol for 24 hours before or after surgery.  No Smoking including e-cigarettes for 24 hours before surgery.  No chewable tobacco products for at least 6 hours before surgery.  No nicotine  patches on the day of surgery.  Do not use any recreational drugs for at least a week (preferably 2 weeks) before your  surgery.  Please be advised that the combination of cocaine and anesthesia may have negative outcomes, up to and including death. If you test positive for cocaine, your surgery will be cancelled.  On the morning of surgery brush your teeth with toothpaste and water, you may rinse your mouth with mouthwash if you wish. Do not swallow any toothpaste or mouthwash.  Shower/Bathe before arriving for your procedure  Do not wear lotions, powders, or perfumes.  Do not shave body hair from the neck down 48 hours before surgery.  Wear comfortable clothing (specific to your surgery type) to the hospital.  Do not wear jewelry, make-up, hairpins, clips or nail polish.  For welded (permanent) jewelry: bracelets, anklets, waist bands, etc.  Please have this removed prior to surgery.  If it is not removed, there is a chance that hospital personnel will need to cut it off on the day of surgery.  Contact lenses, hearing aids and dentures may not be worn into surgery.  Do not bring valuables to the hospital. [facility] is not responsible for any missing/lost belongings or valuables.   Notify your doctor if there is any change in your medical condition (cold, fever, infection).  After surgery, you can help prevent lung complications by doing breathing exercises.  Take deep breaths and cough every 1-2 hours. Your doctor may order a device called an Incentive Spirometer to help you take deep breaths.  If you are being discharged the day of surgery, you will not be allowed to drive home. You will need a responsible individual to drive you home  and stay with you for 24 hours after surgery.   Please call the Pre-admissions Testing Dept. at [phone] if you have any questions about these instructions.  Surgery Visitation Policy:  Patients having surgery or a procedure may have two visitors.  Children under the age of 57 must have an adult with them who is not the patient.  Passenger Transport Manager to address health-related social needs:  [email or web address]

## 2023-11-27 ENCOUNTER — Encounter: Payer: Self-pay | Admitting: Pulmonary Disease

## 2023-11-27 NOTE — Progress Notes (Signed)
 Perioperative / Anesthesia Services  Pre-Admission Testing Clinical Review / Pre-Operative Anesthesia Consult  Date: 11/27/23  PATIENT DEMOGRAPHICS: Name: Todd Whitehead DOB: April 21, 1945 MRN:   [id number]  Note: Available PAT nursing documentation and vital signs have been reviewed. Clinical nursing staff has updated patient's PMH/PSHx, current medication list, and drug allergies/intolerances to ensure complete and comprehensive history available to assist care teams in MDM as it pertains to the aforementioned surgical procedure and anticipated anesthetic course. Extensive review of available clinical information personally performed. Nursing documentation reviewed. [place] PMH and PSHx updated with any diagnoses and/or procedures that I have knowledge of that may have been inadvertently omitted during his intake with the pre-admission testing department's nursing staff.  PLANNED SURGICAL PROCEDURE(S):   Case: [id number] Date/Time: 11/29/23 0915   Procedures:      VIDEO BRONCHOSCOPY WITH ENDOBRONCHIAL NAVIGATION     BRONCHOSCOPY, WITH EBUS   Anesthesia type: General   Diagnosis: Nodule of left lung [R91.1]   Pre-op diagnosis: R91.1 Nodule of Left Lung   Location: [facility] PROCEDURE RM 02 / [facility] ORS FOR ANESTHESIA GROUP   Surgeons: Parris Manna, MD        CLINICAL DISCUSSION: Linton Stolp is a 78 y.o. male who is submitted for pre-surgical anesthesia review and clearance prior to him undergoing the above procedure. Patient is a Current Smoker (58 pack years). Pertinent PMH includes: CAD (s/p CABG), MI, HFrEF, ASD, nonspecific angina, PVD, celiac artery stenosis (s/p stenting), mesenteric vascular insufficiency BILATERAL carotid artery disease,, cardiac murmur, RBBB, aortic atherosclerosis, HTN, HLD, prediabetes, COPD, chronic respiratory failure (on 2-3 L/[place] supplemental oxygen), LEFT lower lobe pulmonary nodule, chronic gastritis, MGUS, BPH, glaucoma, anxiety (on BZO),  depression.  Patient is followed by cardiology Philippe, MD). He was last seen in the cardiology clinic on 07/05/2023; notes reviewed. At the time of his clinic visit, patient doing well overall from a cardiovascular perspective. Patient denied any chest pain, shortness of breath, PND, orthopnea, palpitations, significant peripheral edema, weakness, fatigue, vertiginous symptoms, or presyncope/syncope. Patient with a past medical history significant for cardiovascular diagnoses. Documented physical exam was grossly benign, providing no evidence of acute exacerbation and/or decompensation of the patient's known cardiovascular conditions.  Patient suffered an MI back in 07/2015.  Based on the timing of his catheterization and subsequent intervention, presumably this was an NSTEMI. He went on to have a diagnostic LEFT heart catheterization on 08/19/2015 revealing multivessel CAD: 80% mid LAD, 85% mid LCx, 100% mid RCA, and 50% LM.  Given the degree and complexity of patient's coronary artery disease, the decision was made to refer patient to CVTS for consideration of revascularization.  Patient underwent three-vessel revascularization on 09/09/2015.  LIMA-LAD, SVG-OM1, and SVG-distal RCA were placed.  Endarterectomy of the RCA was also performed.  Most recent TTE performed on 06/10/2023 revealed a normal left ventricular systolic function with an EF of 55-60%. There was mild LVH.  There were no regional wall motion abnormalities.  Left ventricular diastolic Doppler parameters were indeterminate.  Left atrium was moderately dilated.  Right ventricular size and function normal. There was mild to moderate mitral valve regurgitation was observed.  Evidence of atrial level shunting detected by color-flow Doppler indicative of a small atrial septal defect. All transvalvular gradients were noted to be normal providing no evidence of hemodynamically significant valvular stenosis. Aorta normal in size with no evidence  of ectasia or aneurysmal dilatation.  Given patient's history of MI requiring revascularization and PVD, patient remains on daily DAPT therapy using ASA +  clopidogrel .  He is reportedly compliant with therapy with no evidence or reports of GI/GU related bleeding. HFrEF being managed on GDMT interventions including beta-blocker (metoprolol  succinate).  SGLT2i (dapagliflozin ), and ARB/ARNI (Entresto ) therapies.  Patient is on atorvastatin  for his HLD diagnosis and further ASCVD prevention.  He has a prediabetes diagnosis that he is managing with diet lifestyle modifications.  Most recent hemoglobin A1c was 5.5% when checked on 11/05/2023.  Patient does not have an OSAH diagnosis.  Send limited by patient's age and multiple medical comorbidities.  Mainly patient is limited by his respiratory status secondary to advanced COPD and chronic respiratory failure diagnosis for which he uses supplemental oxygen (2-3 L/[place]).  With that said, patient with some cardiovascular/cardiopulmonary limitations with his ADLs/IADLs.  He is questionably able to achieve 4 METS of physical activity without experiencing, at least to some degree, significant angina/anginal equivalent symptoms.  No changes were made to his medication regimen.  Patient to follow-up with outpatient cardiology in 6 months or sooner if needed.  Lynwood Victory Ruth underwent CT imaging of the chest on 03/15/2023 revealing patchy and consolidative LEFT upper lobe opacity with areas of mucoid impaction and bronchial filling.  Additionally, there was mucoid impaction within the RIGHT middle and lower lobes.  Findings were suspicious for pneumonia.  Follow-up CT imaging performed on 11/07/2023 revealed a spiculated nodule in the LEFT lower pulmonary lobe measuring 0.9 x 0.6 cm.  Given the solid character of the observed nodule, there was high suspicion for primary pulmonary malignancy.  Patient was referred to pulmonary medicine for consideration of tissue biopsy for  diagnostic purposes.  Patient subsequently has been scheduled for VIDEO BRONCHOSCOPY WITH ENDOBRONCHIAL NAVIGATION; BRONCHOSCOPY, WITH EBUS on 11/29/2023 with Dr. Halina Picking, MD.   Given patient's past medical history significant for cardiovascular diagnoses, presurgical cardiac clearance was sought by the PAT team. Per cardiology, this patient is optimized for surgery and may proceed with the planned procedural course with a ACCEPTABLE risk of significant perioperative cardiovascular complications.  Again, this patient is on daily DAPT therapy.  He has been instructed on recommendations for holding both his daily low-dose ASA and clopidogrel  doses for 5 days prior to his procedure with plans to restart as soon as postoperative bleeding risk felt to be minimized by his primary attending surgeon.  The patient is aware that the last doses of these medications should be taken on 11/23/2023.  Patient denies previous perioperative complications with anesthesia in the past. In review his EMR, it is noted that patient underwent a general anesthetic course here at [facility] (AS[facility] IV) in 03/2023 without documented complications.   MOST RECENT VITAL SIGNS:    11/26/2023    2:12 PM 11/09/2023   12:10 PM 11/09/2023    9:37 AM  Vitals with BMI  Height 5' 8    Weight 115 lbs    BMI 17.49    Systolic  139 136  Diastolic  63 67  Pulse  93    PROVIDERS/SPECIALISTS: NOTE: Primary physician provider listed below. Patient may have been seen by APP or partner within same practice.   PROVIDER ROLE / SPECIALTY LAST SHERLEAN Picking Halina, MD Pulmonary Medicine (Surgeon) 11/21/2023  Lenon Layman ORN, MD Primary Care Provider 11/13/2023  Florencio Shine, MD Cardiology 07/05/2023   ALLERGIES: Allergies  Allergen Reactions   Other Nausea And Vomiting    Cheese,butter,sour cream,cottage cheese   Poison Ivy Extract Rash   Poison Oak Extract Rash  CURRENT HOME  MEDICATIONS: No current facility-administered medications for this encounter.    acetaminophen  (TYLENOL ) 500 MG tablet   albuterol  (VENTOLIN  HFA) 108 (90 Base) MCG/ACT inhaler   ALPRAZolam  (XANAX ) 0.25 MG tablet   aspirin  EC 81 MG tablet   atorvastatin  (LIPITOR) 40 MG tablet   brimonidine  (ALPHAGAN  P) 0.1 % SOLN   busPIRone  (BUSPAR ) 30 MG tablet   citalopram  (CELEXA ) 20 MG tablet   clopidogrel  (PLAVIX ) 75 MG tablet   dapagliflozin  propanediol (FARXIGA ) 10 MG TABS tablet   latanoprost  (XALATAN ) 0.005 % ophthalmic solution   metoprolol  succinate (TOPROL -XL) 25 MG 24 hr tablet   Multiple Vitamin (MULTIVITAMIN WITH MINERALS) TABS tablet   Multiple Vitamins-Minerals (CENTRUM ADULT 50+ MULTIGUMMIES PO)   QUEtiapine  (SEROQUEL ) 50 MG tablet   sacubitril -valsartan  (ENTRESTO ) 24-26 MG   tamsulosin  (FLOMAX ) 0.4 MG CAPS capsule   traMADol  (ULTRAM ) 50 MG tablet   traZODone  (DESYREL ) 50 MG tablet   umeclidinium-vilanterol (ANORO ELLIPTA) 62.5-25 MCG/ACT AEPB   varenicline (CHANTIX) 0.5 MG tablet   HISTORY: Past Medical History:  Diagnosis Date   Anxiety    a.) on BZO (alprazolam ) PRN   Aortic atherosclerosis    Arthritis    ASD (atrial septal defect)    Bilateral carotid artery disease    BPH (benign prostatic hyperplasia)    CAD (coronary artery disease)    Carotid bruit    Chronic gastritis    Chronic lower back pain    Chronic respiratory failure (HCC)    a.) requires supplemental oxygen (2-3 L/[place]); non-compliant   Colon polyp    COPD (chronic obstructive pulmonary disease) (HCC)    Depression    Glaucoma    Heart murmur    HFrEF (heart failure with reduced ejection fraction) (HCC)    HLD (hyperlipidemia)    Hyperplastic colonic polyp    Hypertension    Long-term use of aspirin  therapy    Low serum vitamin B12 11/18/2020   Melanoma (LEFT upper arm)    Mesenteric vascular insufficiency    MGUS (monoclonal gammopathy of unknown significance)    Myocardial infarction (HCC)  07/2015   a.) type unclear; b.) LHC 08/19/2015: 80% mLM, 85% mLCx, 100% mRCA, 50% LM --> CVTS   Nodule of lower lobe of left lung    Nonspecific chest pain    On long term clopidogrel  therapy    Peripheral vascular disease    a.) s/p celiac artery stent placement   Prediabetes    RBBB (right bundle branch block)    Right inguinal hernia    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   a.) LIMA-LAD, SVG-OM1, SVG-dRCA with endarterectomy   Supplemental oxygen dependent (2-3 L/[place])    Tobacco use    Tubular adenoma    Past Surgical History:  Procedure Laterality Date   BACK SURGERY  01/19/1998   CARDIAC CATHETERIZATION N/A 08/19/2015   Procedure: Left Heart Cath and Coronary Angiography;  Surgeon: Vinie DELENA Jude, MD;  Location: [facility] INVASIVE CV LAB;  Service: Cardiovascular;  Laterality: N/A;   CHOLECYSTECTOMY N/A 08/19/2016   Procedure: LAPAROSCOPIC CHOLECYSTECTOMY WITH INTRAOPERATIVE CHOLANGIOGRAM;  Surgeon: Aron Shoulders, MD;  Location: [facility] OR;  Service: General;  Laterality: N/A;   COLON SURGERY  age 51 days old    bowel was twisted up   COLONOSCOPY WITH PROPOFOL  N/A 03/19/2016   Procedure: COLONOSCOPY WITH PROPOFOL ;  Surgeon: Gladis RAYMOND Mariner, MD;  Location: [facility] ENDOSCOPY;  Service: Endoscopy;  Laterality: N/A;   CORONARY ARTERY BYPASS GRAFT N/A 09/09/2015   ERCP  N/A 08/18/2016   Procedure: ENDOSCOPIC RETROGRADE CHOLANGIOPANCREATOGRAPHY (ERCP);  Surgeon: Saintclair Jasper, MD;  Location: [facility] ENDOSCOPY;  Service: Gastroenterology;  Laterality: N/A;   ESOPHAGOGASTRODUODENOSCOPY (EGD) WITH PROPOFOL  N/A 09/18/2018   Procedure: ESOPHAGOGASTRODUODENOSCOPY (EGD) WITH PROPOFOL ;  Surgeon: Tye Millet, DO;  Location: [facility] ENDOSCOPY;  Service: General;  Laterality: N/A;   EYE SURGERY Left 03/26/2011   FIBEROPTIC BRONCHOSCOPY Bilateral 03/18/2023   Procedure: BEDSIDE BRONCHOSCOPY FIBEROPTIC;  Surgeon: Parris Manna, MD;  Location: [facility] ORS;  Service: Thoracic;  Laterality: Bilateral;   FLEXIBLE BRONCHOSCOPY  Bilateral 03/18/2023   Procedure: FLEXIBLE BRONCHOSCOPY;  Surgeon: Parris Manna, MD;  Location: [facility] ORS;  Service: Thoracic;  Laterality: Bilateral;   LOWER EXTREMITY ANGIOGRAPHY Left 10/03/2017   Procedure: LOWER EXTREMITY ANGIOGRAPHY;  Surgeon: Marea Selinda RAMAN, MD;  Location: [facility] INVASIVE CV LAB;  Service: Cardiovascular;  Laterality: Left;   LOWER EXTREMITY ANGIOGRAPHY Right 10/31/2017   Procedure: LOWER EXTREMITY ANGIOGRAPHY;  Surgeon: Marea Selinda RAMAN, MD;  Location: [facility] INVASIVE CV LAB;  Service: Cardiovascular;  Laterality: Right;   LUMBAR LAMINECTOMY/DECOMPRESSION MICRODISCECTOMY Right 10/02/2012   Procedure: Right Lumbar Five-Sacral One Lumbar laminotomy/Microdiskectomy;  Surgeon: Lamar LELON Peaches, MD;  Location: [facility] NEURO ORS;  Service: Neurosurgery;  Laterality: Right;  Right Lumbar Five-Sacral One Lumbar laminotomy/Microdiskectomy   VISCERAL ANGIOGRAPHY N/A 11/08/2020   Procedure: VISCERAL ANGIOGRAPHY;  Surgeon: Jama Cordella MATSU, MD;  Location: [facility] INVASIVE CV LAB;  Service: Cardiovascular;  Laterality: N/A;   VISCERAL ANGIOGRAPHY N/A 11/25/2020   Procedure: VISCERAL ANGIOGRAPHY;  Surgeon: Jama Cordella MATSU, MD;  Location: [facility] INVASIVE CV LAB;  Service: Cardiovascular;  Laterality: N/A;   Family History  Problem Relation Age of Onset   Heart attack Father    Aneurysm Brother    Lung cancer Brother    Social History   Tobacco Use   Smoking status: Every Day    Current packs/day: 1.00    Average packs/day: 1 pack/day for 58.0 years (58.0 ttl pk-yrs)    Types: Cigarettes   Smokeless tobacco: Never  Substance Use Topics   Alcohol use: No   LABS:  Lab Results  Component Value Date   WBC 38.1 (H) 11/07/2023   HGB 8.7 (L) 11/08/2023   HCT 28.0 (L) 11/07/2023   MCV 89.2 11/07/2023   PLT 439 (H) 11/07/2023   Lab Results  Component Value Date   NA 139 11/08/2023   CL 108 11/08/2023   K 4.0 11/08/2023   CO2 25 11/08/2023   BUN 42 (H) 11/08/2023   CREATININE 1.30 (H)  11/08/2023   GFRNONAA 56 (L) 11/08/2023   CALCIUM  8.8 (L) 11/08/2023   PHOS 4.1 12/10/2020   ALBUMIN  3.9 09/23/2023   GLUCOSE 127 (H) 11/08/2023    ECG: Date: 11/05/2023  Time ECG obtained: 1950 PM Rate: 88 bpm Rhythm: atrial fibrillation; RBBB Axis (leads I and aVF): normal Intervals: QRS 142 ms. QTc 448 ms. ST segment and T wave changes: No evidence of acute T wave abnormalities or significant ST segment elevation or depression.  Evidence of a possible, age undetermined, prior infarct:  Yes; anterior Comparison: Previous tracing obtained on 07/09/2022 showed normal sinus rhythm at rate of 80 bpm with a RBBB.   IMAGING / PROCEDURES: CT CHEST WO CONTRAST performed on 11/07/2023 Extensive heterogeneous and consolidative airspace opacity throughout the right lung base and to a lesser extent the left lung base, consistent with infection or aspiration. Spiculated nodule in the peripheral left lower lobe measuring 0.9 x 0.6 cm unchanged. This has increased in size and  solid character over multiple prior examinations and is highly suspicious for a small enlarging primary lung malignancy. Severe emphysema and diffuse bilateral bronchial wall thickening. Coronary artery disease. Aortic atherosclerosis  TRANSTHORACIC ECHOCARDIOGRAM performed on 06/10/2023 Left ventricular ejection fraction, by estimation, is 55 to 60%. The left ventricle has normal function. Left ventricular endocardial border not optimally defined to evaluate regional wall motion. There is mild left ventricular hypertrophy. Left ventricular diastolic function could not be evaluated.  Right ventricular systolic function is normal. The right ventricular size is normal. Tricuspid regurgitation signal is inadequate for assessing PA pressure.  Left atrial size was moderately dilated.  The mitral valve is normal in structure. Mild to moderate mitral valve regurgitation. No evidence of mitral stenosis.  The aortic valve is normal  in structure. Aortic valve regurgitation is not visualized. No aortic stenosis is present.  The inferior vena cava is normal in size with <50% respiratory variability, suggesting right atrial pressure of 8 mmHg.  Evidence of atrial level shunting detected by color flow Doppler. There is a small atrial septal defect.  Challenging study with limited views.   VAS US  CAROTID performed on 03/26/2022 Velocities in the right ICA are consistent with a 1-39% stenosis. Non-hemodynamically significant plaque <50% noted in the CCA. The ECA appears >50% stenosed.  Velocities in the left ICA are consistent with a 1-39% stenosis. Non-hemodynamically significant plaque <50% noted in the CCA.  Left vertebral artery demonstrates antegrade flow. Right vertebral artery demonstrates an occlusion.  Normal flow hemodynamics were seen in bilateral subclavian arteries.   VAS US  ABI WITH/WO TBI performed on 03/26/2022 Resting right ankle-brachial index is within normal range. The right toe-brachial index is abnormal.  Resting left ankle-brachial index is within normal range. The left toe-brachial index is normal.    IMPRESSION AND PLAN: Doc Mandala has been referred for pre-anesthesia review and clearance prior to him undergoing the planned anesthetic and procedural courses. Available labs, pertinent testing, and imaging results were personally reviewed by me in preparation for upcoming operative/procedural course. [facility] medical record has been updated following extensive record review and patient interview with PAT staff.   This patient has been appropriately cleared by cardiology with an overall ACCEPTABLE risk of patient experiencing significant perioperative cardiovascular complications. Based on clinical review performed today (11/27/23), barring any significant acute changes in the patient's overall condition, it is anticipated that he will be able to proceed with the planned surgical intervention. Any  acute changes in clinical condition may necessitate his procedure being postponed and/or cancelled. Patient will meet with anesthesia team (MD and/or CRNA) on the day of his procedure for preoperative evaluation/assessment. Questions regarding anesthetic course will be fielded at that time.   Pre-surgical instructions were reviewed with the patient during his PAT appointment, and questions were fielded to satisfaction by PAT clinical staff. He has been instructed on which medications that he will need to hold prior to surgery, as well as the ones that have been deemed safe/appropriate to take on the day of his procedure. As part of the general education provided by PAT, patient made aware both verbally and in writing, that he would need to abstain from the use of any illegal substances during his perioperative course. He was advised that failure to follow the provided instructions could necessitate case cancellation or result in serious perioperative complications up to and including death. Patient encouraged to contact PAT and/or his surgeon's office to discuss any questions or concerns that may arise prior to surgery;  verbalized understanding.   Dorise Pereyra, MSN, APRN, FNP-C, CEN [facility]  Perioperative Services Nurse Practitioner Phone: [phone] Fax: [phone] 11/27/23 12:34 PM  NOTE: This note has been prepared using Dragon dictation software. Despite my best ability to proofread, there is always the potential that unintentional transcriptional errors may still occur from this process.

## 2023-11-28 ENCOUNTER — Encounter: Payer: Self-pay | Admitting: Oncology

## 2023-11-29 ENCOUNTER — Ambulatory Visit: Admitting: Urgent Care

## 2023-11-29 ENCOUNTER — Ambulatory Visit

## 2023-11-29 ENCOUNTER — Ambulatory Visit
Admission: RE | Admit: 2023-11-29 | Discharge: 2023-11-29 | Disposition: A | Discharge status: Home / Self Care | Attending: Pulmonary Disease | Admitting: Pulmonary Disease

## 2023-11-29 ENCOUNTER — Other Ambulatory Visit: Payer: Self-pay

## 2023-11-29 ENCOUNTER — Encounter: Payer: Self-pay | Admitting: Pulmonary Disease

## 2023-11-29 ENCOUNTER — Encounter
Admission: RE | Disposition: A | Discharge status: Home / Self Care | Payer: Self-pay | Source: Home / Self Care | Attending: Pulmonary Disease

## 2023-11-29 DIAGNOSIS — Z9981 Dependence on supplemental oxygen: Secondary | ICD-10-CM | POA: Insufficient documentation

## 2023-11-29 DIAGNOSIS — K2950 Unspecified chronic gastritis without bleeding: Secondary | ICD-10-CM | POA: Insufficient documentation

## 2023-11-29 DIAGNOSIS — I4891 Unspecified atrial fibrillation: Secondary | ICD-10-CM | POA: Diagnosis not present

## 2023-11-29 DIAGNOSIS — I2510 Atherosclerotic heart disease of native coronary artery without angina pectoris: Secondary | ICD-10-CM | POA: Diagnosis not present

## 2023-11-29 DIAGNOSIS — I5022 Chronic systolic (congestive) heart failure: Secondary | ICD-10-CM | POA: Insufficient documentation

## 2023-11-29 DIAGNOSIS — J449 Chronic obstructive pulmonary disease, unspecified: Secondary | ICD-10-CM | POA: Insufficient documentation

## 2023-11-29 DIAGNOSIS — N189 Chronic kidney disease, unspecified: Secondary | ICD-10-CM | POA: Insufficient documentation

## 2023-11-29 DIAGNOSIS — F17200 Nicotine dependence, unspecified, uncomplicated: Secondary | ICD-10-CM | POA: Diagnosis not present

## 2023-11-29 DIAGNOSIS — C3432 Malignant neoplasm of lower lobe, left bronchus or lung: Secondary | ICD-10-CM | POA: Diagnosis not present

## 2023-11-29 DIAGNOSIS — I252 Old myocardial infarction: Secondary | ICD-10-CM | POA: Diagnosis not present

## 2023-11-29 DIAGNOSIS — I130 Hypertensive heart and chronic kidney disease with heart failure and stage 1 through stage 4 chronic kidney disease, or unspecified chronic kidney disease: Secondary | ICD-10-CM | POA: Insufficient documentation

## 2023-11-29 DIAGNOSIS — R911 Solitary pulmonary nodule: Secondary | ICD-10-CM | POA: Diagnosis present

## 2023-11-29 DIAGNOSIS — T17500A Unspecified foreign body in bronchus causing asphyxiation, initial encounter: Secondary | ICD-10-CM | POA: Insufficient documentation

## 2023-11-29 DIAGNOSIS — X58XXXA Exposure to other specified factors, initial encounter: Secondary | ICD-10-CM | POA: Diagnosis not present

## 2023-11-29 DIAGNOSIS — Z951 Presence of aortocoronary bypass graft: Secondary | ICD-10-CM | POA: Insufficient documentation

## 2023-11-29 HISTORY — DX: Tobacco use: Z72.0

## 2023-11-29 HISTORY — DX: Prediabetes: R73.03

## 2023-11-29 HISTORY — DX: Unspecified right bundle-branch block: I45.10

## 2023-11-29 HISTORY — DX: Chest pain, unspecified: R07.9

## 2023-11-29 HISTORY — DX: Atherosclerosis of aorta: I70.0

## 2023-11-29 HISTORY — DX: Polyp of colon: K63.5

## 2023-11-29 HISTORY — DX: Depression, unspecified: F32.A

## 2023-11-29 HISTORY — DX: Solitary pulmonary nodule: R91.1

## 2023-11-29 HISTORY — DX: Unspecified systolic (congestive) heart failure: I50.20

## 2023-11-29 HISTORY — DX: Other chronic pain: G89.29

## 2023-11-29 HISTORY — DX: Atherosclerotic heart disease of native coronary artery without angina pectoris: I25.10

## 2023-11-29 HISTORY — DX: Chronic respiratory failure, unspecified whether with hypoxia or hypercapnia: J96.10

## 2023-11-29 HISTORY — DX: Unilateral inguinal hernia, without obstruction or gangrene, not specified as recurrent: K40.90

## 2023-11-29 HISTORY — DX: Dependence on supplemental oxygen: Z99.81

## 2023-11-29 HISTORY — DX: Unspecified chronic gastritis without bleeding: K29.50

## 2023-11-29 HISTORY — DX: Benign neoplasm, unspecified site: D36.9

## 2023-11-29 HISTORY — DX: Chronic vascular disorders of intestine: K55.1

## 2023-11-29 HISTORY — DX: Long term (current) use of anticoagulants: Z79.01

## 2023-11-29 HISTORY — DX: Benign prostatic hyperplasia without lower urinary tract symptoms: N40.0

## 2023-11-29 HISTORY — DX: Disorder of arteries and arterioles, unspecified: I77.9

## 2023-11-29 HISTORY — DX: Low back pain, unspecified: M54.50

## 2023-11-29 HISTORY — DX: Other specified symptoms and signs involving the circulatory and respiratory systems: R09.89

## 2023-11-29 HISTORY — DX: Long term (current) use of aspirin: Z79.82

## 2023-11-29 HISTORY — DX: Monoclonal gammopathy: D47.2

## 2023-11-29 HISTORY — DX: Atrial septal defect, unspecified: Q21.10

## 2023-11-29 HISTORY — DX: Hyperlipidemia, unspecified: E78.5

## 2023-11-29 SURGERY — VIDEO BRONCHOSCOPY WITH ENDOBRONCHIAL NAVIGATION
Anesthesia: General

## 2023-11-29 MED ORDER — SUGAMMADEX SODIUM 200 MG/2ML IV SOLN
INTRAVENOUS | Status: DC | PRN
  Administered 2023-11-29: 200 mg via INTRAVENOUS

## 2023-11-29 MED ORDER — EPHEDRINE SULFATE-NACL 50-0.9 MG/10ML-% IV SOSY
PREFILLED_SYRINGE | INTRAVENOUS | Status: DC | PRN
  Administered 2023-11-29: 10 mg via INTRAVENOUS

## 2023-11-29 MED ORDER — FENTANYL CITRATE (PF) 100 MCG/2ML IJ SOLN
INTRAMUSCULAR | Status: AC
  Filled 2023-11-29: qty 2

## 2023-11-29 MED ORDER — PHENYLEPHRINE 80 MCG/ML (10ML) SYRINGE FOR IV PUSH (FOR BLOOD PRESSURE SUPPORT)
PREFILLED_SYRINGE | INTRAVENOUS | Status: DC | PRN
  Administered 2023-11-29: 80 ug via INTRAVENOUS

## 2023-11-29 MED ORDER — CHLORHEXIDINE GLUCONATE 0.12 % MT SOLN
OROMUCOSAL | Status: AC
Start: 2023-11-29 — End: 2023-11-29
  Filled 2023-11-29: qty 15

## 2023-11-29 MED ORDER — PROPOFOL 10 MG/ML IV BOLUS
INTRAVENOUS | Status: AC
  Filled 2023-11-29: qty 20

## 2023-11-29 MED ORDER — ROCURONIUM BROMIDE 10 MG/ML (PF) SYRINGE
PREFILLED_SYRINGE | INTRAVENOUS | Status: AC
  Filled 2023-11-29: qty 10

## 2023-11-29 MED ORDER — CHLORHEXIDINE GLUCONATE 0.12 % MT SOLN
15.0000 mL | Freq: Once | OROMUCOSAL | Status: AC
  Administered 2023-11-29: 15 mL via OROMUCOSAL

## 2023-11-29 MED ORDER — ROCURONIUM BROMIDE 100 MG/10ML IV SOLN
INTRAVENOUS | Status: DC | PRN
  Administered 2023-11-29: 40 mg via INTRAVENOUS

## 2023-11-29 MED ORDER — OXYCODONE HCL 5 MG PO TABS: 5.0000 mg | ORAL_TABLET | Freq: Once | ORAL | Status: DC | PRN

## 2023-11-29 MED ORDER — LIDOCAINE HCL (PF) 2 % IJ SOLN
INTRAMUSCULAR | Status: AC
  Filled 2023-11-29: qty 5

## 2023-11-29 MED ORDER — PROPOFOL 10 MG/ML IV BOLUS
INTRAVENOUS | Status: DC | PRN
  Administered 2023-11-29: 100 mg via INTRAVENOUS

## 2023-11-29 MED ORDER — ONDANSETRON HCL 4 MG/2ML IJ SOLN
INTRAMUSCULAR | Status: DC | PRN
  Administered 2023-11-29: 4 mg via INTRAVENOUS

## 2023-11-29 MED ORDER — MIDAZOLAM HCL 2 MG/2ML IJ SOLN
INTRAMUSCULAR | Status: AC
  Filled 2023-11-29: qty 2

## 2023-11-29 MED ORDER — OXYCODONE HCL 5 MG/5ML PO SOLN: 5.0000 mg | Freq: Once | ORAL | Status: DC | PRN

## 2023-11-29 MED ORDER — DEXAMETHASONE SOD PHOSPHATE PF 10 MG/ML IJ SOLN
INTRAMUSCULAR | Status: DC | PRN
  Administered 2023-11-29: 8 mg via INTRAVENOUS

## 2023-11-29 MED ORDER — VASOPRESSIN 20 UNIT/ML IV SOLN
INTRAVENOUS | Status: AC
  Filled 2023-11-29: qty 1

## 2023-11-29 MED ORDER — MIDAZOLAM HCL (PF) 2 MG/2ML IJ SOLN
INTRAMUSCULAR | Status: DC | PRN
  Administered 2023-11-29: .5 mg via INTRAVENOUS

## 2023-11-29 MED ORDER — FENTANYL CITRATE (PF) 100 MCG/2ML IJ SOLN
INTRAMUSCULAR | Status: DC | PRN
  Administered 2023-11-29: 50 ug via INTRAVENOUS

## 2023-11-29 MED ORDER — EPHEDRINE 5 MG/ML INJ
INTRAVENOUS | Status: AC
  Filled 2023-11-29: qty 5

## 2023-11-29 MED ORDER — VASOPRESSIN 20 UNIT/ML IV SOLN
INTRAVENOUS | Status: DC | PRN
  Administered 2023-11-29 (×2): .5 [IU] via INTRAVENOUS

## 2023-11-29 MED ORDER — FENTANYL CITRATE (PF) 100 MCG/2ML IJ SOLN: 25.0000 ug | INTRAMUSCULAR | Status: DC | PRN

## 2023-11-29 MED ORDER — ONDANSETRON HCL 4 MG/2ML IJ SOLN
INTRAMUSCULAR | Status: AC
  Filled 2023-11-29: qty 2

## 2023-11-29 MED ORDER — LIDOCAINE HCL (CARDIAC) PF 100 MG/5ML IV SOSY
PREFILLED_SYRINGE | INTRAVENOUS | Status: DC | PRN
  Administered 2023-11-29: 80 mg via INTRAVENOUS

## 2023-11-29 MED ORDER — LACTATED RINGERS IV SOLN: INTRAVENOUS | Status: DC

## 2023-11-29 MED ORDER — ORAL CARE MOUTH RINSE: 15.0000 mL | Freq: Once | OROMUCOSAL | Status: AC

## 2023-11-29 NOTE — H&P (Signed)
 PULMONOLOGY         Date: 11/29/2023,   MRN# [id number] Tonny Isensee 05-05-1945     AdmissionWeight: 52.2 kg                 CurrentWeight: 52.2 kg   CHIEF COMPLAINT:   Spiculated left lower lobe nodule and Right lower lobe lesion with mucus plugging   HISTORY OF PRESENT ILLNESS   This is a 78 year old male with a history of anxiety disorder arthritis, atrial septal defect carotid artery disease BPH coronary artery disease chronic gastritis COPD with lifelong smoking who came in the hospital recently and was treated for possible pneumonia.  He was noted to have a left lower lobe spiculated nodule as well as a right lower lobe lesion with mucous plugging.  Today he is here to have bronchoscopic evaluation with airway inspection, therapeutic aspiration of mucous plugging, bronchoalveolar lavage for cytology and microbiology, navigational bronchoscopy of spiculated left lower lobe nodule finally review of his lymph node stations for possible metastatic atypia.  We discussed procedure in detail with patient and family are agreeable thankful for care. Reviewed risks/complications and benefits with patient, risks include infection, pneumothorax/pneumomediastinum which may require chest tube placement as well as overnight/prolonged hospitalization and possible mechanical ventilation. Other risks include bleeding and very rarely death.  Patient understands risks and wishes to proceed.  Additional questions were answered, and patient is aware that post procedure patient will be going home with family and may experience cough with possible clots on expectoration as well as phlegm which may last few days as well as hoarseness of voice post intubation and mechanical ventilation.    PAST MEDICAL HISTORY   Past Medical History:  Diagnosis Date   Anxiety    a.) on BZO (alprazolam ) PRN   Aortic atherosclerosis    Arthritis    ASD (atrial septal defect)    Bilateral carotid artery  disease    BPH (benign prostatic hyperplasia)    CAD (coronary artery disease)    Carotid bruit    Chronic gastritis    Chronic lower back pain    Chronic respiratory failure (HCC)    a.) requires supplemental oxygen (2-3 L/[place]); non-compliant   Colon polyp    COPD (chronic obstructive pulmonary disease) (HCC)    Depression    Glaucoma    Heart murmur    HFrEF (heart failure with reduced ejection fraction) (HCC)    HLD (hyperlipidemia)    Hyperplastic colonic polyp    Hypertension    Long-term use of aspirin  therapy    Low serum vitamin B12 11/18/2020   Melanoma (LEFT upper arm)    Mesenteric vascular insufficiency    MGUS (monoclonal gammopathy of unknown significance)    Myocardial infarction (HCC) 07/2015   a.) type unclear; b.) LHC 08/19/2015: 80% mLM, 85% mLCx, 100% mRCA, 50% LM --> CVTS   Nodule of lower lobe of left lung    Nonspecific chest pain    On long term clopidogrel  therapy    Peripheral vascular disease    a.) s/p celiac artery stent placement   Prediabetes    RBBB (right bundle branch block)    Right inguinal hernia    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   a.) LIMA-LAD, SVG-OM1, SVG-dRCA with endarterectomy   Supplemental oxygen dependent (2-3 L/[place])    Tobacco use    Tubular adenoma      SURGICAL HISTORY   Past Surgical History:  Procedure Laterality Date  BACK SURGERY  01/19/1998   CARDIAC CATHETERIZATION N/A 08/19/2015   Procedure: Left Heart Cath and Coronary Angiography;  Surgeon: Vinie DELENA Jude, MD;  Location: [facility];  Service: Cardiovascular;  Laterality: N/A;   CHOLECYSTECTOMY N/A 08/19/2016   Procedure: LAPAROSCOPIC CHOLECYSTECTOMY WITH INTRAOPERATIVE CHOLANGIOGRAM;  Surgeon: Aron Shoulders, MD;  Location: [facility] OR;  Service: General;  Laterality: N/A;   COLON SURGERY  age 2 days old    bowel was twisted up   COLONOSCOPY WITH PROPOFOL  N/A 03/19/2016   Procedure: COLONOSCOPY WITH PROPOFOL ;  Surgeon: Gladis RAYMOND Mariner, MD;  Location: [facility]  ENDOSCOPY;  Service: Endoscopy;  Laterality: N/A;   CORONARY ARTERY BYPASS GRAFT N/A 09/09/2015   ERCP N/A 08/18/2016   Procedure: ENDOSCOPIC RETROGRADE CHOLANGIOPANCREATOGRAPHY (ERCP);  Surgeon: Saintclair Jasper, MD;  Location: [facility] ENDOSCOPY;  Service: Gastroenterology;  Laterality: N/A;   ESOPHAGOGASTRODUODENOSCOPY (EGD) WITH PROPOFOL  N/A 09/18/2018   Procedure: ESOPHAGOGASTRODUODENOSCOPY (EGD) WITH PROPOFOL ;  Surgeon: Tye Millet, DO;  Location: [facility] ENDOSCOPY;  Service: General;  Laterality: N/A;   EYE SURGERY Left 03/26/2011   FIBEROPTIC BRONCHOSCOPY Bilateral 03/18/2023   Procedure: BEDSIDE BRONCHOSCOPY FIBEROPTIC;  Surgeon: Parris Manna, MD;  Location: [facility] ORS;  Service: Thoracic;  Laterality: Bilateral;   FLEXIBLE BRONCHOSCOPY Bilateral 03/18/2023   Procedure: FLEXIBLE BRONCHOSCOPY;  Surgeon: Parris Manna, MD;  Location: [facility] ORS;  Service: Thoracic;  Laterality: Bilateral;   LOWER EXTREMITY ANGIOGRAPHY Left 10/03/2017   Procedure: LOWER EXTREMITY ANGIOGRAPHY;  Surgeon: Marea Selinda RAMAN, MD;  Location: [facility];  Service: Cardiovascular;  Laterality: Left;   LOWER EXTREMITY ANGIOGRAPHY Right 10/31/2017   Procedure: LOWER EXTREMITY ANGIOGRAPHY;  Surgeon: Marea Selinda RAMAN, MD;  Location: [facility];  Service: Cardiovascular;  Laterality: Right;   LUMBAR LAMINECTOMY/DECOMPRESSION MICRODISCECTOMY Right 10/02/2012   Procedure: Right Lumbar Five-Sacral One Lumbar laminotomy/Microdiskectomy;  Surgeon: Lamar LELON Peaches, MD;  Location: [facility]URO OR[facility];  Service: Neurosurgery;  Laterality: Right;  Right Lumbar Five-Sacral One Lumbar laminotomy/Microdiskectomy   VISCERAL ANGIOGRAPHY N/A 11/08/2020   Procedure: VISCERAL ANGIOGRAPHY;  Surgeon: Jama Cordella MATSU, MD;  Location: [facility];  Service: Cardiovascular;  Laterality: N/A;   VISCERAL ANGIOGRAPHY N/A 11/25/2020   Procedure: VISCERAL ANGIOGRAPHY;  Surgeon: Jama Cordella MATSU, MD;  Location: [facility];   Service: Cardiovascular;  Laterality: N/A;     FAMILY HISTORY   Family History  Problem Relation Age of Onset   Heart attack Father    Aneurysm Brother    Lung cancer Brother      SOCIAL HISTORY   Social History   Tobacco Use   Smoking status: Every Day    Current packs/day: 1.00    Average packs/day: 1 pack/day for 58.0 years (58.0 ttl pk-yrs)    Types: Cigarettes   Smokeless tobacco: Never  Vaping Use   Vaping status: Never Used  Substance Use Topics   Alcohol use: No   Drug use: No     MEDICATIONS    Home Medication:    Current Medication:  Current Facility-Administered Medications:    lactated ringers  infusion, , Intravenous, Continuous, Vicci Camellia Glatter, MD, Last Rate: 10 mL/hr at 11/29/23 0844, New Bag at 11/29/23 0844    ALLERGIES   Other, Poison ivy extract, and Poison oak extract     REVIEW OF SYSTEMS    Review of Systems:  Gen:  Denies  fever, sweats, chills weigh loss  HEENT: Denies blurred vision, double vision, ear pain, eye pain, hearing loss, nose bleeds, sore throat Cardiac:  No dizziness, chest pain or heaviness, chest tightness,edema Resp:   reports dyspnea chronically  Gi: Denies swallowing difficulty, stomach pain, nausea or vomiting, diarrhea, constipation, bowel incontinence Gu:  Denies bladder incontinence, burning urine Ext:   Denies Joint pain, stiffness or swelling Skin: Denies  skin rash, easy bruising or bleeding or hives Endoc:  Denies polyuria, polydipsia , polyphagia or weight change Psych:   Denies depression, insomnia or hallucinations   Other:  All other systems negative   VS: BP (!) 116/47 (BP Location: Right Arm)   Temp 98.2 F (36.8 C) (Temporal)   Ht 5' 8 (1.727 m)   Wt 52.2 kg   SpO2 100%   BMI 17.49 kg/m      PHYSICAL EXAM    GENERAL:NAD, no fevers, chills, no weakness no fatigue HEAD: Normocephalic, atraumatic.  EYES: Pupils equal, round, reactive to light. Extraocular muscles intact.  No scleral icterus.  MOUTH: Moist mucosal membrane. Dentition intact. No abscess noted.  EAR, NOSE, THROAT: Clear without exudates. No external lesions.  NECK: Supple. No thyromegaly. No nodules. No JVD.  PULMONARY: decreased breath sounds with mild rhonchi worse at bases bilaterally.  CARDIOVASCULAR: S1 and S2. Regular rate and rhythm. No murmurs, rubs, or gallops. No edema. Pedal pulses 2+ bilaterally.  GASTROINTESTINAL: Soft, nontender, nondistended. No masses. Positive bowel sounds. No hepatosplenomegaly.  MUSCULOSKELETAL: No swelling, clubbing, or edema. Range of motion full in all extremities.  NEUROLOGIC: Cranial nerves II through XII are intact. No gross focal neurological deficits. Sensation intact. Reflexes intact.  SKIN: No ulceration, lesions, rashes, or cyanosis. Skin warm and dry. Turgor intact.  PSYCHIATRIC: Mood, affect within normal limits. The patient is awake, alert and oriented x 3. Insight, judgment intact.       IMAGING   Narrative & Impression  CLINICAL DATA:  Lung nodule * Tracking Code: BO *   EXAM: CT CHEST WITHOUT CONTRAST   TECHNIQUE: Multidetector CT imaging of the chest was performed following the standard protocol without IV contrast.   RADIATION DOSE REDUCTION: This exam was performed according to the departmental dose-optimization program which includes automated exposure control, adjustment of the mA and/or kV according to patient size and/or use of iterative reconstruction technique.   COMPARISON:  03/15/2023   FINDINGS: Cardiovascular: Aortic atherosclerosis. Normal heart size. Extensive three-vessel coronary artery calcifications. No pericardial effusion.   Mediastinum/Nodes: No enlarged mediastinal, hilar, or axillary lymph nodes. Thyroid  gland, trachea, and esophagus demonstrate no significant findings.   Lungs/Pleura: Severe emphysema. Diffuse bilateral bronchial wall thickening. Extensive heterogeneous and consolidative  airspace opacity throughout the right lung base and to a lesser extent the left lung base (series 4, image 139). Spiculated nodule in the peripheral left lower lobe measuring 0.9 x 0.6 cm unchanged (series 4, image 129) no pleural effusion or pneumothorax.   Upper Abdomen: No acute abnormality.   Musculoskeletal: No chest wall abnormality. No acute osseous findings.   IMPRESSION: 1. Extensive heterogeneous and consolidative airspace opacity throughout the right lung base and to a lesser extent the left lung base, consistent with infection or aspiration. 2. Spiculated nodule in the peripheral left lower lobe measuring 0.9 x 0.6 cm unchanged. This has increased in size and solid character over multiple prior examinations and is highly suspicious for a small enlarging primary lung malignancy. 3. Severe emphysema and diffuse bilateral bronchial wall thickening. 4. Coronary artery disease.   Aortic Atherosclerosis (ICD10-I70.0) and Emphysema (ICD10-J43.9).     Electronically Signed   By: Marolyn JONETTA Jaksch M.D.   On:  11/07/2023 14:12    ASSESSMENT/PLAN   Spiculated lesion of left lower lobe and mucous plugging of right lung              He was noted to have a left lower lobe spiculated nodule as well as a right lower lobe lesion with mucous plugging.  Today he is here to have bronchoscopic evaluation with airway inspection, therapeutic aspiration of mucous plugging, bronchoalveolar lavage for cytology and microbiology, navigational bronchoscopy of spiculated left lower lobe nodule finally review of his lymph node stations for possible metastatic atypia.  We discussed procedure in detail with patient and family are agreeable thankful for care. Reviewed risks/complications and benefits with patient, risks include infection, pneumothorax/pneumomediastinum which may require chest tube placement as well as overnight/prolonged hospitalization and possible mechanical ventilation. Other risks include  bleeding and very rarely death.  Patient understands risks and wishes to proceed.  Additional questions were answered, and patient is aware that post procedure patient will be going home with family and may experience cough with possible clots on expectoration as well as phlegm which may last few days as well as hoarseness of voice post intubation and mechanical ventilation.        Thank you for allowing me to participate in the care of this patient.   Patient/Family are satisfied with care plan and all questions have been answered.    Provider disclosure: Patient with at least one acute or chronic illness or injury that poses a threat to life or bodily function and is being managed actively during this encounter.  All of the below services have been performed independently by signing provider:  review of prior documentation from internal and or external health records.  Review of previous and current lab results.  Interview and comprehensive assessment during patient visit today. Review of current and previous chest radiographs/CT scans. Discussion of management and test interpretation with health care team and patient/family.   This document was prepared using Dragon voice recognition software and may include unintentional dictation errors.     Eulogia Dismore, M.D.  Division of Pulmonary & Critical Care Medicine

## 2023-11-29 NOTE — Anesthesia Postprocedure Evaluation (Signed)
 Anesthesia Post Note  Patient: Todd Whitehead  Procedure(s) Performed: VIDEO BRONCHOSCOPY WITH ENDOBRONCHIAL NAVIGATION BRONCHOSCOPY, WITH EBUS  Patient location during evaluation: PACU Anesthesia Type: General Level of consciousness: awake and alert Pain management: pain level controlled Vital Signs Assessment: post-procedure vital signs reviewed and stable Respiratory status: spontaneous breathing, nonlabored ventilation, respiratory function stable and patient connected to nasal cannula oxygen Cardiovascular status: blood pressure returned to baseline and stable Postop Assessment: no apparent nausea or vomiting Anesthetic complications: no   No notable events documented.   Last Vitals:  Vitals:   11/29/23 1215 11/29/23 1230  BP: 116/72 (!) 132/96  Pulse: 71 69  Resp: 13 15  Temp:  (!) 36.3 C  SpO2: 96% 100%    Last Pain:  Vitals:   11/29/23 1230  TempSrc:   PainSc: 0-No pain                 Lendia LITTIE Mae

## 2023-11-29 NOTE — Procedures (Signed)
 ROBOTIC NAVIGATIONAL BRONCHOSCOPY PROCEDURE NOTE 31627  FIBEROPTIC BRONCHOSCOPY WITH BRONCHOALVEOLAR LAVAGE PROCEDURE NOTE 31624  THERAPEUTIC ASPIRATION OF TRACHEOBRONCHIAL TREE 31645  FIDUCIAL MARKER PLACEMENT 68373  ENDOBRONCHIAL ULTRASOUND X >/3 LYMPH NODE PROCEDURE NOTE 31653   TRANSBRONCHIAL FNA X 2 LOBES 31629 & 31625  TRANSBRONCHIAL SURGICAL LUNG BIOPSY X 1 LOBE 31628  RADIAL ENDOBRONCHIAL ULTRASOUND 31654  TRANSBRONCHIAL CYTOPATHOLOGY BRUSHINGS 68376    Flexible bronchoscopy was performed  by : Parris MD  assistance by : 1)Repiratory therapist  and 2)cytotech staff and 3) Anesthesia team and 4) Flouroscopy team and 5) IT supporting staff for robotic platform   Indication for the procedure was :  Pre-procedural H&P. The following assessment was performed on the day of the procedure prior to initiating sedation History:  Chest pain n Dyspnea y Hemoptysis n Cough y Fever n Other pertinent items n  Examination Vital signs -reviewed as per nursing documentation today Cardiac    Murmurs: n  Rubs : n  Gallop: n Lungs Wheezing: n Rales : n Rhonchi :y  Other pertinent findings: SOB/hypoxemia due to chronic lung disease   Pre-procedural assessment for Procedural Sedation included: Depth of sedation: As per anesthesia team  ASA Classification:  2 Mallampati airway assessment: 3    Medication list reviewed: y  The patient's interval history was taken and revealed: no new complaints The pre- procedure physical examination revealed: No new findings Refer to prior clinic note for details.  Informed Consent: Informed consent was obtained from:  patient after explanation of procedure and risks, benefits, as well as alternative procedures available.  Explanation of level of sedation and possible transfusion was also provided.    Procedural Preparation: Time out was performed and patient was identified by name and birthdate and procedure to be performed and  side for sampling, if any, was specified. Pt was intubated by anesthesia.  The patient was appropriately draped.   Fiberoptic bronchoscopy with airway inspection, therapeutic aspiration of tracheobronchial tree and BAL Procedure findings:  Bronchoscope was inserted via ETT  without difficulty.  Posterior oropharynx, epiglottis, arytenoids, false cords and vocal cords were not visualized as these were bypassed by endotracheal tube. The distal trachea was normal in circumference and appearance without mucosal, cartilaginous or branching abnormalities.  The main carina was mildly splayed . All right and left lobar airways were NOT visualized to the Subsegmental level due to obstructed airways from phlegm and mucus  plugging. Mucus plugging with partial to complete airway obstruction was noted bilaterally and treated with therapeutic aspiration prior to beginning of robotic bronchoscopy to allow adequate visualization and accurate lung biopsy and improve patients respiratory status.   The mucosa was : friable at target segment    Media Information  EXTRINSIC COMRESSION OF RIGHT LOWER LOBE POSTERIOR BASAL SEGMENT    Media Information  BILATERAL ANTHRACOTIC PIGMENT CONSISTENT WITH LIFELONG & ACTIVE SMOKING      Media Information  TARGET AT LLL WITH FRIABLE LESION VISUAL PROBE INSPECTION    Airways were notable for:        exophytic lesions :n       extrinsic compression in the following distributions: n.       Friable mucosa: y       Teacher, music /pigmentation: n     Post procedure Diagnosis:   Mucus plugging of tracheobronchial tree      Navigational Bronchoscopy Procedure Findings:   Ion robotic platform utilized for this procedure. Post appropriate planning and registration peripheral navigation was used to visualize target  lesion.   Radial endobronchial US  technology utilized during this procedure for confirmation of lesion.  3D Fluoroscopy utilized for tool in  lesion confirmation    Target 1 - LEFT LOWER LOBE Transbronchial cytobrushing x 1 ,   Transbronchial FNA x 3,   Transbronchial surgical pathology x3 , BAL X1    TARGET 2 - RIGHT LOWER LOBE - FNA X 3 , BAL 1   BAL was performed at RIGHT LUNG location and sent for processing with cytology and microbiology  Due to insufficient tissue via FNA a surgical lung biopsy via surgical forceps was required for adequate pathology tissue evaluation     Media Information  CONCENTRIC SIGNAL FOR RADIAL EBUS LESION CONFIRMATION    3D FLUORO NEEDLE IN LESION CONFIRMATION    Post procedure diagnosis:  SPICULATED NODULE OF LLL AND MUCUS PLUGGING OF RLL       Endobronchial ultrasound assisted hilar and mediastinal lymph node biopsies procedure findings: The fiberoptic bronchoscope was removed and the EBUS scope was introduced. Examination began to evaluate for pathologically enlarged lymph nodes starting on the RIGHT side progressing to the LEFT side.  All lymph node biopsies performed with 21G needle. Lymph node biopsies were sent in cytolite for all stations.  STATION 4R - NOT BIOPSIED STATION 10R - 13 MM BIOPSIED 3 TIMES  STATION 7 - 17 MM BIOPSIED 3 TIMES STATION 10L - BIOPSIED 3 TIMES  STATION 4L - NOT BIOPSIED  Immediate sampling complications included:NONE immediate  Epinephrine  ZERO ml was used topically  The bronchoscopy was terminated due to completion of the planned procedure and the bronchoscope was removed.   Total dosage of Lidocaine  was ZERO mg  Estimated Blood loss: EXPECTED < 5cc.  Complications included:  NONE   Preliminary CXR findings :  IN PROCESS  Disposition: HOME WITH FAMILY   Follow up with Dr. Lukisha Procida in 5 days for result discussion.     Halina Picking MD  [facility] Division of Pulmonary & Critical Care Medicine

## 2023-11-29 NOTE — Transfer of Care (Signed)
 Immediate Anesthesia Transfer of Care Note  Patient: Todd Whitehead  Procedure(s) Performed: VIDEO BRONCHOSCOPY WITH ENDOBRONCHIAL NAVIGATION BRONCHOSCOPY, WITH EBUS  Patient Location: PACU  Anesthesia Type:General  Level of Consciousness: drowsy and patient cooperative  Airway & Oxygen Therapy: Patient Spontanous Breathing and Patient connected to face mask oxygen  Post-op Assessment: Report given to RN and Post -op Vital signs reviewed and stable  Post vital signs: stable  Last Vitals:  Vitals Value Taken Time  BP    Temp    Pulse 71 11/29/23 11:50  Resp 20 11/29/23 11:50  SpO2 100 % 11/29/23 11:50  Vitals shown include unfiled device data.  Last Pain:  Vitals:   11/29/23 0836  TempSrc: Temporal         Complications: No notable events documented.

## 2023-11-29 NOTE — Anesthesia Preprocedure Evaluation (Addendum)
 Anesthesia Evaluation  Patient identified by MRN, date of birth, ID band Patient awake    Reviewed: Allergy & Precautions, NPO status , Patient's Chart, lab work & pertinent test results  History of Anesthesia Complications Negative for: history of anesthetic complications  Airway Mallampati: III  TM Distance: >3 FB Neck ROM: full    Dental  (+) Poor Dentition   Pulmonary shortness of breath and with exertion, COPD,  COPD inhaler and oxygen dependent, Current Smoker and Patient abstained from smoking.   Pulmonary exam normal        Cardiovascular Exercise Tolerance: Poor hypertension, + angina  + CAD, + Past MI, + CABG, + Peripheral Vascular Disease and +CHF  + dysrhythmias Atrial Fibrillation + Valvular Problems/Murmurs   Most recent TTE performed on 06/10/2023 revealed a normal left ventricular systolic function with an EF of 55-60%. There was mild LVH.  There were no regional wall motion abnormalities.  Left ventricular diastolic Doppler parameters were indeterminate.  Left atrium was moderately dilated.  Right ventricular size and function normal. There was mild to moderate mitral valve regurgitation was observed.  Evidence of atrial level shunting detected by color-flow Doppler indicative of a small atrial septal defect. All transvalvular gradients were noted to be normal providing no evidence of hemodynamically significant valvular stenosis. Aorta normal in size with no evidence of ectasia or aneurysmal dilatation.   Neuro/Psych  PSYCHIATRIC DISORDERS Anxiety Depression    negative neurological ROS     GI/Hepatic negative GI ROS, Neg liver ROS,,,  Endo/Other  negative endocrine ROS    Renal/GU CRFRenal disease  negative genitourinary   Musculoskeletal   Abdominal   Peds  Hematology  (+) Blood dyscrasia, anemia   Anesthesia Other Findings Past Medical History: No date: Anxiety     Comment:  a.) on BZO (alprazolam )  PRN No date: Aortic atherosclerosis No date: Arthritis No date: ASD (atrial septal defect) No date: Bilateral carotid artery disease No date: BPH (benign prostatic hyperplasia) No date: CAD (coronary artery disease) No date: Carotid bruit No date: Chronic gastritis No date: Chronic lower back pain No date: Chronic respiratory failure (HCC)     Comment:  a.) requires supplemental oxygen (2-3 L/[place]);               non-compliant No date: Colon polyp No date: COPD (chronic obstructive pulmonary disease) (HCC) No date: Depression No date: Glaucoma No date: Heart murmur No date: HFrEF (heart failure with reduced ejection fraction) (HCC) No date: HLD (hyperlipidemia) No date: Hyperplastic colonic polyp No date: Hypertension No date: Long-term use of aspirin  therapy 11/18/2020: Low serum vitamin B12 No date: Melanoma (LEFT upper arm) No date: Mesenteric vascular insufficiency No date: MGUS (monoclonal gammopathy of unknown significance) 07/2015: Myocardial infarction (HCC)     Comment:  a.) type unclear; b.) LHC 08/19/2015: 80% mLM, 85% mLCx,              100% mRCA, 50% LM --> CVTS No date: Nodule of lower lobe of left lung No date: Nonspecific chest pain No date: On long term clopidogrel  therapy No date: Peripheral vascular disease     Comment:  a.) s/p celiac artery stent placement No date: Prediabetes No date: RBBB (right bundle branch block) No date: Right inguinal hernia 09/09/2015: S/P CABG x 3     Comment:  a.) LIMA-LAD, SVG-OM1, SVG-dRCA with endarterectomy No date: Supplemental oxygen dependent (2-3 L/[place]) No date: Tobacco use No date: Tubular adenoma  Past Surgical History: 01/19/1998: BACK SURGERY 08/19/2015: CARDIAC  CATHETERIZATION; N/A     Comment:  Procedure: Left Heart Cath and Coronary Angiography;                Surgeon: Vinie DELENA Jude, MD;  Location: [facility] INVASIVE CV               LAB;  Service: Cardiovascular;  Laterality: N/A; 08/19/2016: CHOLECYSTECTOMY;  N/A     Comment:  Procedure: LAPAROSCOPIC CHOLECYSTECTOMY WITH               INTRAOPERATIVE CHOLANGIOGRAM;  Surgeon: Aron Shoulders,               MD;  Location: [facility];  Service: General;  Laterality:               N/A; age 73 days old: COLON SURGERY     Comment:   bowel was twisted up 03/19/2016: COLONOSCOPY WITH PROPOFOL ; N/A     Comment:  Procedure: COLONOSCOPY WITH PROPOFOL ;  Surgeon: Gladis RAYMOND Mariner, MD;  Location: [facility] ENDOSCOPY;  Service:               Endoscopy;  Laterality: N/A; 09/09/2015: CORONARY ARTERY BYPASS GRAFT; N/A 08/18/2016: ERCP; N/A     Comment:  Procedure: ENDOSCOPIC RETROGRADE               CHOLANGIOPANCREATOGRAPHY (ERCP);  Surgeon: Saintclair Jasper,               MD;  Location: [facility] ENDOSCOPY;  Service: Gastroenterology;               Laterality: N/A; 09/18/2018: ESOPHAGOGASTRODUODENOSCOPY (EGD) WITH PROPOFOL ; N/A     Comment:  Procedure: ESOPHAGOGASTRODUODENOSCOPY (EGD) WITH               PROPOFOL ;  Surgeon: Tye Millet, DO;  Location: [facility]               ENDOSCOPY;  Service: General;  Laterality: N/A; 03/26/2011: EYE SURGERY; Left 03/18/2023: FIBEROPTIC BRONCHOSCOPY; Bilateral     Comment:  Procedure: BEDSIDE BRONCHOSCOPY FIBEROPTIC;  Surgeon:               Parris Manna, MD;  Location: [facility] ORS;  Service:               Thoracic;  Laterality: Bilateral; 03/18/2023: FLEXIBLE BRONCHOSCOPY; Bilateral     Comment:  Procedure: FLEXIBLE BRONCHOSCOPY;  Surgeon: Parris Manna, MD;  Location: [facility] ORS;  Service: Thoracic;                Laterality: Bilateral; 10/03/2017: LOWER EXTREMITY ANGIOGRAPHY; Left     Comment:  Procedure: LOWER EXTREMITY ANGIOGRAPHY;  Surgeon: Marea Selinda RAMAN, MD;  Location: [facility] INV[facility] CV LAB;  Service:               Cardiovascular;  Laterality: Left; 10/31/2017: LOWER EXTREMITY ANGIOGRAPHY; Right     Comment:  Procedure: LOWER EXTREMITY ANGIOGRAPHY;  Surgeon: Marea Selinda RAMAN, MD;   Location: [facility] INV[facility] CV LAB;  Service:               Cardiovascular;  Laterality: Right; 10/02/2012: LUMBAR LAMINECTOMY/DECOMPRESSION MICRODISCECTOMY; Right     Comment:  Procedure: Right Lumbar Five-Sacral  One Lumbar               laminotomy/Microdiskectomy;  Surgeon: Lamar LELON Peaches,               MD;  [facility]: [facility]UR[facility] OR[facility];  Service: Neurosurgery;                Laterality: Right;  Right Lumbar Five-Sacral One Lumbar               laminotomy/Microdiskectomy 11/08/2020: VISCERAL ANGIOGRAPHY; N/A     Comment:  Procedure: VISCERAL ANGIOGRAPHY;  Surgeon: Jama Cordella MATSU, MD;  Location: [facility] IN[facility] CV LAB;  Service:              Cardiovascular;  Laterality: N/A; 11/25/2020: VISCERAL ANGIOGRAPHY; N/A     Comment:  Procedure: VISCERAL ANGIOGRAPHY;  Surgeon: Jama Cordella MATSU, MD;  Location: [facility] INV[facility] CV LAB;  Service:              Cardiovascular;  Laterality: N/A;     Reproductive/Obstetrics negative OB ROS                              Anesthesia Physical Anesthesia Plan  ASA: 4  Anesthesia Plan: General ETT   Post-op Pain Management: Ofirmev  IV (intra-op)* and Minimal or no pain anticipated   Induction: Intravenous  PONV Risk Score and Plan: 2 and Ondansetron , Dexamethasone  and Treatment may vary due to age or medical condition  Airway Management Planned: Oral ETT  Additional Equipment:   Intra-op Plan:   Post-operative Plan: Extubation in OR  Informed Consent: I have reviewed the patients History and Physical, chart, labs and discussed the procedure including the risks, benefits and alternatives for the proposed anesthesia with the patient or authorized representative who has indicated his/her understanding and acceptance.     Dental Advisory Given  Plan Discussed with: Anesthesiologist, CRNA and Surgeon  Anesthesia Plan Comments: (Patient consented for risks of anesthesia including but not limited  to:  - adverse reactions to medications - damage to eyes, teeth, lips or other oral mucosa - nerve damage due to positioning  - sore throat or hoarseness - Damage to heart, brain, nerves, lungs, other parts of body or loss of life  Patient voiced understanding and assent.)         Anesthesia Quick Evaluation

## 2023-11-29 NOTE — Anesthesia Procedure Notes (Signed)
 Procedure Name: Intubation Date/Time: 11/29/2023 9:41 AM  Performed by: Elly Pfeiffer, CRNAPre-anesthesia Checklist: Patient identified, Emergency Drugs available, Suction available and Patient being monitored Patient Re-evaluated:Patient Re-evaluated prior to induction Oxygen Delivery Method: Circle system utilized Preoxygenation: Pre-oxygenation with 100% oxygen Induction Type: IV induction Ventilation: Mask ventilation without difficulty Laryngoscope Size: McGrath and 4 Grade View: Grade II Tube type: Oral Number of attempts: 1 Airway Equipment and Method: Stylet and Oral airway Placement Confirmation: ETT inserted through vocal cords under direct vision, positive ETCO2 and breath sounds checked- equal and bilateral Secured at: 21 cm Tube secured with: Tape Dental Injury: Teeth and Oropharynx as per pre-operative assessment  Comments: Cords clear; no trauma. CA

## 2023-11-30 ENCOUNTER — Encounter: Payer: Self-pay | Admitting: Pulmonary Disease

## 2023-12-01 LAB — CULTURE, BAL-QUANTITATIVE W GRAM STAIN: Culture: NO GROWTH

## 2023-12-01 LAB — ACID FAST SMEAR (AFB, MYCOBACTERIA): Acid Fast Smear: NEGATIVE

## 2023-12-02 ENCOUNTER — Encounter: Payer: Self-pay | Admitting: Pulmonary Disease

## 2023-12-02 LAB — SURGICAL PATHOLOGY

## 2023-12-04 LAB — CYTOLOGY - NON PAP

## 2023-12-17 ENCOUNTER — Encounter: Payer: Self-pay | Admitting: Radiation Oncology

## 2023-12-17 ENCOUNTER — Ambulatory Visit
Admission: RE | Admit: 2023-12-17 | Discharge: 2023-12-17 | Disposition: A | Discharge status: Home / Self Care | Source: Ambulatory Visit | Attending: Radiation Oncology | Admitting: Radiation Oncology

## 2023-12-17 ENCOUNTER — Other Ambulatory Visit: Payer: Self-pay | Admitting: *Deleted

## 2023-12-17 VITALS — BP 127/72 | HR 85 | Temp 97.5°F | Resp 16 | Wt 118.0 lb

## 2023-12-17 DIAGNOSIS — E785 Hyperlipidemia, unspecified: Secondary | ICD-10-CM | POA: Insufficient documentation

## 2023-12-17 DIAGNOSIS — I739 Peripheral vascular disease, unspecified: Secondary | ICD-10-CM | POA: Diagnosis not present

## 2023-12-17 DIAGNOSIS — M1990 Unspecified osteoarthritis, unspecified site: Secondary | ICD-10-CM | POA: Insufficient documentation

## 2023-12-17 DIAGNOSIS — F17210 Nicotine dependence, cigarettes, uncomplicated: Secondary | ICD-10-CM | POA: Diagnosis not present

## 2023-12-17 DIAGNOSIS — R911 Solitary pulmonary nodule: Secondary | ICD-10-CM | POA: Insufficient documentation

## 2023-12-17 DIAGNOSIS — G8929 Other chronic pain: Secondary | ICD-10-CM | POA: Diagnosis not present

## 2023-12-17 DIAGNOSIS — I110 Hypertensive heart disease with heart failure: Secondary | ICD-10-CM | POA: Insufficient documentation

## 2023-12-17 DIAGNOSIS — J90 Pleural effusion, not elsewhere classified: Secondary | ICD-10-CM | POA: Diagnosis not present

## 2023-12-17 DIAGNOSIS — Z860101 Personal history of adenomatous and serrated colon polyps: Secondary | ICD-10-CM | POA: Diagnosis not present

## 2023-12-17 DIAGNOSIS — I5022 Chronic systolic (congestive) heart failure: Secondary | ICD-10-CM | POA: Diagnosis not present

## 2023-12-17 DIAGNOSIS — I2510 Atherosclerotic heart disease of native coronary artery without angina pectoris: Secondary | ICD-10-CM | POA: Diagnosis not present

## 2023-12-17 DIAGNOSIS — C3490 Malignant neoplasm of unspecified part of unspecified bronchus or lung: Secondary | ICD-10-CM

## 2023-12-17 DIAGNOSIS — Z79899 Other long term (current) drug therapy: Secondary | ICD-10-CM | POA: Diagnosis not present

## 2023-12-17 DIAGNOSIS — Z7902 Long term (current) use of antithrombotics/antiplatelets: Secondary | ICD-10-CM | POA: Insufficient documentation

## 2023-12-17 DIAGNOSIS — Z85820 Personal history of malignant melanoma of skin: Secondary | ICD-10-CM | POA: Diagnosis not present

## 2023-12-17 DIAGNOSIS — J439 Emphysema, unspecified: Secondary | ICD-10-CM | POA: Insufficient documentation

## 2023-12-17 DIAGNOSIS — Z7982 Long term (current) use of aspirin: Secondary | ICD-10-CM | POA: Insufficient documentation

## 2023-12-17 DIAGNOSIS — J9610 Chronic respiratory failure, unspecified whether with hypoxia or hypercapnia: Secondary | ICD-10-CM | POA: Insufficient documentation

## 2023-12-17 DIAGNOSIS — N400 Enlarged prostate without lower urinary tract symptoms: Secondary | ICD-10-CM | POA: Diagnosis not present

## 2023-12-17 DIAGNOSIS — I252 Old myocardial infarction: Secondary | ICD-10-CM | POA: Insufficient documentation

## 2023-12-17 DIAGNOSIS — H409 Unspecified glaucoma: Secondary | ICD-10-CM | POA: Diagnosis not present

## 2023-12-17 DIAGNOSIS — Z801 Family history of malignant neoplasm of trachea, bronchus and lung: Secondary | ICD-10-CM | POA: Diagnosis not present

## 2023-12-17 NOTE — Consult Note (Signed)
 NEW PATIENT EVALUATION  Name: Todd Whitehead  MRN: [id number]  Date:   12/17/2023     DOB: [birth year]   This 78 y.o. 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  REFERRING PHYSICIAN: Lenon Layman ORN, MD  CHIEF COMPLAINT: No chief complaint on file.   DIAGNOSIS: The encounter diagnosis was Lung nodule.   PREVIOUS INVESTIGATIONS:  CT scan reviewed PET CT scan ordered Clinical notes reviewed Cytology and surgical pathology reviewed  HPI: Patient is a 78 year old male with significant COPD emphysema continued smoking history CABG x 2.  He has been followed by Dr Aleskerov for airspace opacity throughout the right lung base.  Also was following a spiculated nodule in the periphery left lower lobe measuring 0.9 cm.  Patient underwent bronchoscopy with right lower lobe showing rare atypical cells suspicious for non-small cell cancer.  Left lower lobe showed blood and scant benign bronchial mucosa negative for granuloma or malignancy.  Most recent CT super D chest showed improved bibasilar heterogeneous and consolidative airspace disease more dense and organized in the right lung base and nearly resolved in the left lung base.  Patient also had a small right pleural effusion new compared to prior exam.  Patient is having no significant cough hemoptysis or chest tightness.  He is now referred to radiation oncology for opinion.  PLANNED TREATMENT REGIMEN: SBRT to right lower lobe  PAST MEDICAL HISTORY:  has a past medical history of Anxiety, Aortic atherosclerosis, Arthritis, ASD (atrial septal defect), Bilateral carotid artery disease, BPH (benign prostatic hyperplasia), CAD (coronary artery disease), Carotid bruit, Chronic gastritis, Chronic lower back pain, Chronic respiratory failure (HCC), Colon polyp, COPD (chronic obstructive pulmonary disease) (HCC), Depression, Glaucoma, Heart murmur, HFrEF (heart  failure with reduced ejection fraction) (HCC), HLD (hyperlipidemia), Hyperplastic colonic polyp, Hypertension, Long-term use of aspirin  therapy, Low serum vitamin B12 (11/18/2020), Melanoma (LEFT upper arm), Mesenteric vascular insufficiency, MGUS (monoclonal gammopathy of unknown significance), Myocardial infarction (HCC) (07/2015), Nodule of lower lobe of left lung, Nonspecific chest pain, On long term clopidogrel  therapy, Peripheral vascular disease, Prediabetes, RBBB (right bundle branch block), Right inguinal hernia, S/P CABG x 3 (09/09/2015), Supplemental oxygen dependent (2-3 L/[place]), Tobacco use, and Tubular adenoma.    PAST SURGICAL HISTORY:  Past Surgical History:  Procedure Laterality Date   BACK SURGERY  01/19/1998   CARDIAC CATHETERIZATION N/A 08/19/2015   Procedure: Left Heart Cath and Coronary Angiography;  Surgeon: Vinie DELENA Jude, MD;  Location: [facility] IN[facility] LAB;  Service: Cardiovascular;  Laterality: N/A;   CHOLECYSTECTOMY N/A 08/19/2016   Procedure: LAPAROSCOPIC CHOLECYSTECTOMY WITH INTRAOPERATIVE CHOLANGIOGRAM;  Surgeon: Aron Shoulders, MD;  Location: [facility] OR;  Service: General;  Laterality: N/A;   COLON SURGERY  age 66 days old    bowel was twisted up   COLONOSCOPY WITH PROPOFOL  N/A 03/19/2016   Procedure: COLONOSCOPY WITH PROPOFOL ;  Surgeon: Gladis RAYMOND Mariner, MD;  Location[facility] ENDOSCOPY;  Service: Endoscopy;  Laterality: N/A;   CORONARY ARTERY BYPASS GRAFT N/A 09/09/2015   ERCP N/A 08/18/2016   Procedure: ENDOSCOPIC RETROGRADE CHOLANGIOPANCREATOGRAPHY (ERCP);  Surgeon: Saintclair Jasper, MD;  Location: [facility] ENDOSCOPY;  Service: Gastroenterology;  Laterality: N/A;   ESOPHAGOGASTRODUODENOSCOPY (EGD) WITH PROPOFOL  N/A 09/18/2018   Procedure: ESOPHAGOGASTRODUODENOSCOPY (EGD) WITH PROPOFOL ;  Surgeon: Tye Millet, DO;  Location: [facility] ENDOSCOPY;  Service: General;  Laterality: N/A;   EYE SURGERY Left 03/26/2011   FIBEROPTIC BRONCHOSCOPY Bilateral 03/18/2023   Procedure: BEDSIDE  BRONCHOSCOPY FIBEROPTIC;  Surgeon: Parris Manna, MD;  Location: [facility] ORS;  Service: Thoracic;  Laterality: Bilateral;   FLEXIBLE BRONCHOSCOPY Bilateral 03/18/2023   Procedure: FLEXIBLE BRONCHOSCOPY;  Surgeon: Parris Manna, MD;  Location: [facility] ORS;  Service: Thoracic;  Laterality: Bilateral;   LOWER EXTREMITY ANGIOGRAPHY Left 10/03/2017   Procedure: LOWER EXTREMITY ANGIOGRAPHY;  Surgeon: Marea Selinda RAMAN, MD;  Location: [facility];  Service: Cardiovascular;  Laterality: Left;   LOWER EXTREMITY ANGIOGRAPHY Right 10/31/2017   Procedure: LOWER EXTREMITY ANGIOGRAPHY;  Surgeon: Marea Selinda RAMAN, MD;  Location: [facility];  Service: Cardiovascular;  Laterality: Right;   LUMBAR LAMINECTOMY/DECOMPRESSION MICRODISCECTOMY Right 10/02/2012   Procedure: Right Lumbar Five-Sacral One Lumbar laminotomy/Microdiskectomy;  Surgeon: Lamar LELON Peaches, MD;  Location: [facility];  Service: Neurosurgery;  Laterality: Right;  Right Lumbar Five-Sacral One Lumbar laminotomy/Microdiskectomy   VIDEO BRONCHOSCOPY WITH ENDOBRONCHIAL NAVIGATION N/A 11/29/2023   Procedure: VIDEO BRONCHOSCOPY WITH ENDOBRONCHIAL NAVIGATION;  Surgeon: Parris Manna, MD;  Location: [facility] ORS;  Service: Thoracic;  Laterality: N/A;   VIDEO BRONCHOSCOPY WITH ENDOBRONCHIAL ULTRASOUND N/A 11/29/2023   Procedure: BRONCHOSCOPY, WITH EBUS;  Surgeon: Parris Manna, MD;  Location: [facility] ORS;  Service: Thoracic;  Laterality: N/A;   VISCERAL ANGIOGRAPHY N/A 11/08/2020   Procedure: VISCERAL ANGIOGRAPHY;  Surgeon: Jama Cordella MATSU, MD;  Location: [facility] LAB;  Service: Cardiovascular;  Laterality: N/A;   VISCERAL ANGIOGRAPHY N/A 11/25/2020   Procedure: VISCERAL ANGIOGRAPHY;  Surgeon: Jama Cordella MATSU, MD;  Location: [facility] LAB;  Service: Cardiovascular;  Laterality: N/A;    FAMILY HISTORY: family history includes Aneurysm in his brother; Heart attack in his father; Lung cancer in his brother.  SOCIAL HISTORY:   reports that he has been smoking cigarettes. He has a 58 pack-year smoking history. He has never used smokeless tobacco. He reports that he does not drink alcohol and does not use drugs.  ALLERGIES: Other, Poison ivy extract, and Poison oak extract  MEDICATIONS:  Current Outpatient Medications  Medication Sig Dispense Refill   acetaminophen  (TYLENOL ) 500 MG tablet Take 1 tablet (500 mg total) by mouth every 6 (six) hours as needed. 30 tablet 0   albuterol  (VENTOLIN  HFA) 108 (90 Base) MCG/ACT inhaler Inhale 2 puffs into the lungs every 6 (six) hours as needed for wheezing or shortness of breath. 17 g 0   ALPRAZolam  (XANAX ) 0.25 MG tablet Take 0.25 mg by mouth at bedtime as needed for sleep.     aspirin  EC 81 MG tablet Take 81 mg by mouth daily. Swallow whole.     atorvastatin  (LIPITOR) 40 MG tablet Take 40 mg by mouth daily.      brimonidine  (ALPHAGAN  P) 0.1 % SOLN Place 1 drop into both eyes 2 (two) times daily.     busPIRone  (BUSPAR ) 30 MG tablet Take 30 mg by mouth 2 (two) times daily.     citalopram  (CELEXA ) 20 MG tablet Take 20 mg by mouth daily.     clopidogrel  (PLAVIX ) 75 MG tablet TAKE 1 TABLET(75 MG) BY MOUTH DAILY 30 tablet 5   dapagliflozin  propanediol (FARXIGA ) 10 MG TABS tablet Take 1 tablet (10 mg total) by mouth daily before breakfast. 90 tablet 3   latanoprost  (XALATAN ) 0.005 % ophthalmic solution Place 1 drop into both eyes at bedtime.   2   metoprolol  succinate (TOPROL -XL) 25 MG 24 hr tablet Take 12.5 mg by mouth daily.     Multiple Vitamin (MULTIVITAMIN WITH MINERALS) TABS tablet Take 1 tablet by mouth daily. 30 tablet 0  Multiple Vitamins-Minerals (CENTRUM ADULT 50+ MULTIGUMMIES PO) Take 1 tablet by mouth daily.     QUEtiapine  (SEROQUEL ) 50 MG tablet Take 50 mg by mouth at bedtime.     sacubitril -valsartan  (ENTRESTO ) 24-26 MG Take 1 tablet by mouth 2 (two) times daily. 180 tablet 3   tamsulosin  (FLOMAX ) 0.4 MG CAPS capsule Take 1 capsule (0.4 mg total) by mouth daily after  breakfast. 30 capsule 2   traMADol  (ULTRAM ) 50 MG tablet Take 25-50 mg by mouth 3 (three) times daily as needed.     traZODone  (DESYREL ) 50 MG tablet Take 50 mg by mouth at bedtime.     varenicline (CHANTIX) 0.5 MG tablet Take 0.5 mg by mouth 2 (two) times daily.     umeclidinium-vilanterol (ANORO ELLIPTA) 62.5-25 MCG/ACT AEPB Inhale 1 puff into the lungs daily. (Patient not taking: Reported on 12/17/2023) 56 each 0   No current facility-administered medications for this encounter.    ECOG PERFORMANCE STATUS:  0 - Asymptomatic  REVIEW OF SYSTEMS: Patient denies any weight loss, fatigue, weakness, fever, chills or night sweats. Patient denies any loss of vision, blurred vision. Patient denies any ringing  of the ears or hearing loss. No irregular heartbeat. Patient denies heart murmur or history of fainting. Patient denies any chest pain or pain radiating to her upper extremities. Patient denies any shortness of breath, difficulty breathing at night, cough or hemoptysis. Patient denies any swelling in the lower legs. Patient denies any nausea vomiting, vomiting of blood, or coffee ground material in the vomitus. Patient denies any stomach pain. Patient states has had normal bowel movements no significant constipation or diarrhea. Patient denies any dysuria, hematuria or significant nocturia. Patient denies any problems walking, swelling in the joints or loss of balance. Patient denies any skin changes, loss of hair or loss of weight. Patient denies any excessive worrying or anxiety or significant depression. Patient denies any problems with insomnia. Patient denies excessive thirst, polyuria, polydipsia. Patient denies any swollen glands, patient denies easy bruising or easy bleeding. Patient denies any recent infections, allergies or URI. Patient s visual fields have not changed significantly in recent time.   PHYSICAL EXAM: BP 127/72   Pulse 85   Temp (!) 97.5 F (36.4 C) (Tympanic)   Resp 16    Wt 118 lb (53.5 kg) Comment: stated wt  BMI 17.94 kg/m  Well-developed well-nourished patient in NAD. HEENT reveals PERLA, EOMI, discs not visualized.  Oral cavity is clear. No oral mucosal lesions are identified. Neck is clear without evidence of cervical or supraclavicular adenopathy. Lungs are clear to A&P. Cardiac examination is essentially unremarkable with regular rate and rhythm without murmur rub or thrill. Abdomen is benign with no organomegaly or masses noted. Motor sensory and DTR levels are equal and symmetric in the upper and lower extremities. Cranial nerves II through XII are grossly intact. Proprioception is intact. No peripheral adenopathy or edema is identified. No motor or sensory levels are noted. Crude visual fields are within normal range.  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  Should this show only stage I disease in the right lower lobe would offer SBRT treatment to that region.  Would plan on 93 Gray in 5 fractions.  Risks  and benefits of treatment including extremely low side effect profile were reviewed with the patient.  He seems to comprehend my treatment plan well.  I will review PET scan before proceeding to simulation.  I would like to take this opportunity to thank you for allowing me to participate in the care of your patient.SABRA Marcey Penton, MD

## 2023-12-18 ENCOUNTER — Other Ambulatory Visit: Payer: Self-pay

## 2023-12-18 ENCOUNTER — Emergency Department

## 2023-12-18 ENCOUNTER — Inpatient Hospital Stay
Admission: EM | Admit: 2023-12-18 | Discharge: 2023-12-22 | DRG: 871 | Disposition: A | Discharge status: Home Health | Attending: Internal Medicine | Admitting: Internal Medicine

## 2023-12-18 DIAGNOSIS — D472 Monoclonal gammopathy: Secondary | ICD-10-CM | POA: Diagnosis present

## 2023-12-18 DIAGNOSIS — E119 Type 2 diabetes mellitus without complications: Secondary | ICD-10-CM

## 2023-12-18 DIAGNOSIS — J9611 Chronic respiratory failure with hypoxia: Secondary | ICD-10-CM | POA: Diagnosis present

## 2023-12-18 DIAGNOSIS — I4510 Unspecified right bundle-branch block: Secondary | ICD-10-CM | POA: Diagnosis present

## 2023-12-18 DIAGNOSIS — F411 Generalized anxiety disorder: Secondary | ICD-10-CM | POA: Diagnosis present

## 2023-12-18 DIAGNOSIS — E43 Unspecified severe protein-calorie malnutrition: Secondary | ICD-10-CM | POA: Diagnosis present

## 2023-12-18 DIAGNOSIS — C3490 Malignant neoplasm of unspecified part of unspecified bronchus or lung: Secondary | ICD-10-CM | POA: Diagnosis present

## 2023-12-18 DIAGNOSIS — S41112A Laceration without foreign body of left upper arm, initial encounter: Secondary | ICD-10-CM | POA: Diagnosis present

## 2023-12-18 DIAGNOSIS — J440 Chronic obstructive pulmonary disease with acute lower respiratory infection: Secondary | ICD-10-CM | POA: Diagnosis present

## 2023-12-18 DIAGNOSIS — E785 Hyperlipidemia, unspecified: Secondary | ICD-10-CM | POA: Diagnosis present

## 2023-12-18 DIAGNOSIS — K2950 Unspecified chronic gastritis without bleeding: Secondary | ICD-10-CM | POA: Diagnosis present

## 2023-12-18 DIAGNOSIS — N400 Enlarged prostate without lower urinary tract symptoms: Secondary | ICD-10-CM | POA: Diagnosis present

## 2023-12-18 DIAGNOSIS — I509 Heart failure, unspecified: Secondary | ICD-10-CM

## 2023-12-18 DIAGNOSIS — Z91011 Allergy to milk products, unspecified: Secondary | ICD-10-CM

## 2023-12-18 DIAGNOSIS — Z7982 Long term (current) use of aspirin: Secondary | ICD-10-CM

## 2023-12-18 DIAGNOSIS — F329 Major depressive disorder, single episode, unspecified: Secondary | ICD-10-CM | POA: Diagnosis present

## 2023-12-18 DIAGNOSIS — Z9049 Acquired absence of other specified parts of digestive tract: Secondary | ICD-10-CM

## 2023-12-18 DIAGNOSIS — E1165 Type 2 diabetes mellitus with hyperglycemia: Secondary | ICD-10-CM | POA: Diagnosis present

## 2023-12-18 DIAGNOSIS — F17210 Nicotine dependence, cigarettes, uncomplicated: Secondary | ICD-10-CM | POA: Diagnosis present

## 2023-12-18 DIAGNOSIS — J189 Pneumonia, unspecified organism: Secondary | ICD-10-CM | POA: Diagnosis present

## 2023-12-18 DIAGNOSIS — D62 Acute posthemorrhagic anemia: Principal | ICD-10-CM | POA: Diagnosis present

## 2023-12-18 DIAGNOSIS — R64 Cachexia: Secondary | ICD-10-CM | POA: Diagnosis present

## 2023-12-18 DIAGNOSIS — J690 Pneumonitis due to inhalation of food and vomit: Secondary | ICD-10-CM | POA: Diagnosis present

## 2023-12-18 DIAGNOSIS — Q211 Atrial septal defect, unspecified: Secondary | ICD-10-CM

## 2023-12-18 DIAGNOSIS — R011 Cardiac murmur, unspecified: Secondary | ICD-10-CM | POA: Diagnosis present

## 2023-12-18 DIAGNOSIS — J4489 Other specified chronic obstructive pulmonary disease: Secondary | ICD-10-CM | POA: Diagnosis present

## 2023-12-18 DIAGNOSIS — A419 Sepsis, unspecified organism: Principal | ICD-10-CM | POA: Diagnosis present

## 2023-12-18 DIAGNOSIS — Z860100 Personal history of colon polyps, unspecified: Secondary | ICD-10-CM

## 2023-12-18 DIAGNOSIS — R911 Solitary pulmonary nodule: Secondary | ICD-10-CM | POA: Diagnosis present

## 2023-12-18 DIAGNOSIS — F418 Other specified anxiety disorders: Secondary | ICD-10-CM | POA: Diagnosis present

## 2023-12-18 DIAGNOSIS — J441 Chronic obstructive pulmonary disease with (acute) exacerbation: Principal | ICD-10-CM | POA: Diagnosis present

## 2023-12-18 DIAGNOSIS — I739 Peripheral vascular disease, unspecified: Secondary | ICD-10-CM | POA: Diagnosis present

## 2023-12-18 DIAGNOSIS — M1990 Unspecified osteoarthritis, unspecified site: Secondary | ICD-10-CM | POA: Diagnosis present

## 2023-12-18 DIAGNOSIS — Z1152 Encounter for screening for COVID-19: Secondary | ICD-10-CM

## 2023-12-18 DIAGNOSIS — E1151 Type 2 diabetes mellitus with diabetic peripheral angiopathy without gangrene: Secondary | ICD-10-CM | POA: Diagnosis present

## 2023-12-18 DIAGNOSIS — Z79899 Other long term (current) drug therapy: Secondary | ICD-10-CM

## 2023-12-18 DIAGNOSIS — Z85820 Personal history of malignant melanoma of skin: Secondary | ICD-10-CM

## 2023-12-18 DIAGNOSIS — Z801 Family history of malignant neoplasm of trachea, bronchus and lung: Secondary | ICD-10-CM

## 2023-12-18 DIAGNOSIS — I130 Hypertensive heart and chronic kidney disease with heart failure and stage 1 through stage 4 chronic kidney disease, or unspecified chronic kidney disease: Secondary | ICD-10-CM | POA: Diagnosis present

## 2023-12-18 DIAGNOSIS — I700 Atherosclerosis of aorta: Secondary | ICD-10-CM | POA: Diagnosis present

## 2023-12-18 DIAGNOSIS — H409 Unspecified glaucoma: Secondary | ICD-10-CM | POA: Diagnosis present

## 2023-12-18 DIAGNOSIS — Z8249 Family history of ischemic heart disease and other diseases of the circulatory system: Secondary | ICD-10-CM

## 2023-12-18 DIAGNOSIS — I2510 Atherosclerotic heart disease of native coronary artery without angina pectoris: Secondary | ICD-10-CM | POA: Diagnosis present

## 2023-12-18 DIAGNOSIS — D649 Anemia, unspecified: Secondary | ICD-10-CM | POA: Diagnosis not present

## 2023-12-18 DIAGNOSIS — I5033 Acute on chronic diastolic (congestive) heart failure: Secondary | ICD-10-CM | POA: Diagnosis present

## 2023-12-18 DIAGNOSIS — I252 Old myocardial infarction: Secondary | ICD-10-CM

## 2023-12-18 DIAGNOSIS — Z91199 Patient's noncompliance with other medical treatment and regimen due to unspecified reason: Secondary | ICD-10-CM

## 2023-12-18 DIAGNOSIS — Z7902 Long term (current) use of antithrombotics/antiplatelets: Secondary | ICD-10-CM

## 2023-12-18 DIAGNOSIS — G4700 Insomnia, unspecified: Secondary | ICD-10-CM | POA: Diagnosis present

## 2023-12-18 DIAGNOSIS — N1831 Chronic kidney disease, stage 3a: Secondary | ICD-10-CM | POA: Diagnosis present

## 2023-12-18 DIAGNOSIS — G8929 Other chronic pain: Secondary | ICD-10-CM | POA: Diagnosis present

## 2023-12-18 DIAGNOSIS — Z7984 Long term (current) use of oral hypoglycemic drugs: Secondary | ICD-10-CM

## 2023-12-18 DIAGNOSIS — X58XXXA Exposure to other specified factors, initial encounter: Secondary | ICD-10-CM | POA: Diagnosis present

## 2023-12-18 DIAGNOSIS — R54 Age-related physical debility: Secondary | ICD-10-CM | POA: Diagnosis present

## 2023-12-18 DIAGNOSIS — E1122 Type 2 diabetes mellitus with diabetic chronic kidney disease: Secondary | ICD-10-CM | POA: Diagnosis present

## 2023-12-18 DIAGNOSIS — M545 Low back pain, unspecified: Secondary | ICD-10-CM | POA: Diagnosis present

## 2023-12-18 DIAGNOSIS — Z681 Body mass index (BMI) 19 or less, adult: Secondary | ICD-10-CM

## 2023-12-18 DIAGNOSIS — Z9981 Dependence on supplemental oxygen: Secondary | ICD-10-CM

## 2023-12-18 DIAGNOSIS — Z9109 Other allergy status, other than to drugs and biological substances: Secondary | ICD-10-CM

## 2023-12-18 DIAGNOSIS — Z66 Do not resuscitate: Secondary | ICD-10-CM | POA: Diagnosis present

## 2023-12-18 DIAGNOSIS — N183 Chronic kidney disease, stage 3 unspecified: Secondary | ICD-10-CM | POA: Diagnosis present

## 2023-12-18 DIAGNOSIS — Z951 Presence of aortocoronary bypass graft: Secondary | ICD-10-CM

## 2023-12-18 LAB — CBC WITH DIFFERENTIAL/PLATELET
Abs Immature Granulocytes: 0.11 K/uL — ABNORMAL HIGH (ref 0.00–0.07)
Basophils Absolute: 0 K/uL (ref 0.0–0.1)
Basophils Relative: 0 %
Eosinophils Absolute: 0 K/uL (ref 0.0–0.5)
Eosinophils Relative: 0 %
HCT: 20.7 % — ABNORMAL LOW (ref 39.0–52.0)
Hemoglobin: 6.4 g/dL — ABNORMAL LOW (ref 13.0–17.0)
Immature Granulocytes: 1 %
Lymphocytes Relative: 7 %
Lymphs Abs: 0.9 K/uL (ref 0.7–4.0)
MCH: 27.7 pg (ref 26.0–34.0)
MCHC: 30.9 g/dL (ref 30.0–36.0)
MCV: 89.6 fL (ref 80.0–100.0)
Monocytes Absolute: 1.5 K/uL — ABNORMAL HIGH (ref 0.1–1.0)
Monocytes Relative: 11 %
Neutro Abs: 10.5 K/uL — ABNORMAL HIGH (ref 1.7–7.7)
Neutrophils Relative %: 81 %
Platelets: 259 K/uL (ref 150–400)
RBC: 2.31 MIL/uL — ABNORMAL LOW (ref 4.22–5.81)
RDW: 14.3 % (ref 11.5–15.5)
WBC: 13 K/uL — ABNORMAL HIGH (ref 4.0–10.5)
nRBC: 0 % (ref 0.0–0.2)

## 2023-12-18 LAB — PREPARE RBC (CROSSMATCH)

## 2023-12-18 LAB — PRO BRAIN NATRIURETIC PEPTIDE: Pro Brain Natriuretic Peptide: 5709 pg/mL — ABNORMAL HIGH (ref <300.0)

## 2023-12-18 LAB — COMPREHENSIVE METABOLIC PANEL WITH GFR
ALT: 28 U/L (ref 0–44)
AST: 35 U/L (ref 15–41)
Albumin: 3.4 g/dL — ABNORMAL LOW (ref 3.5–5.0)
Alkaline Phosphatase: 119 U/L (ref 38–126)
Anion gap: 7 (ref 5–15)
BUN: 17 mg/dL (ref 8–23)
CO2: 30 mmol/L (ref 22–32)
Calcium: 9.1 mg/dL (ref 8.9–10.3)
Chloride: 101 mmol/L (ref 98–111)
Creatinine, Ser: 1.03 mg/dL (ref 0.61–1.24)
GFR, Estimated: >60 mL/min (ref >60)
Glucose, Bld: 164 mg/dL — ABNORMAL HIGH (ref 70–99)
Potassium: 3.8 mmol/L (ref 3.5–5.1)
Sodium: 138 mmol/L (ref 135–145)
Total Bilirubin: 0.4 mg/dL (ref 0.0–1.2)
Total Protein: 6.5 g/dL (ref 6.5–8.1)

## 2023-12-18 LAB — RESP PANEL BY RT-PCR (RSV, FLU A&B, COVID)  RVPGX2
Influenza A by PCR: NEGATIVE
Influenza B by PCR: NEGATIVE
Resp Syncytial Virus by PCR: NEGATIVE
SARS Coronavirus 2 by RT PCR: NEGATIVE

## 2023-12-18 LAB — TROPONIN T, HIGH SENSITIVITY
Troponin T High Sensitivity: 33 ng/L — ABNORMAL HIGH (ref 0–19)
Troponin T High Sensitivity: 32 ng/L — ABNORMAL HIGH (ref 0–19)

## 2023-12-18 LAB — HEMOGLOBIN AND HEMATOCRIT, BLOOD
HCT: 20.1 % — ABNORMAL LOW (ref 39.0–52.0)
Hemoglobin: 6.6 g/dL — ABNORMAL LOW (ref 13.0–17.0)

## 2023-12-18 LAB — LACTIC ACID, PLASMA
Lactic Acid, Venous: 2.4 mmol/L (ref 0.5–1.9)
Lactic Acid, Venous: 2.3 mmol/L (ref 0.5–1.9)

## 2023-12-18 LAB — MAGNESIUM: Magnesium: 3.2 mg/dL — ABNORMAL HIGH (ref 1.7–2.4)

## 2023-12-18 MED ORDER — BUSPIRONE HCL 10 MG PO TABS
30.0000 mg | ORAL_TABLET | Freq: Two times a day (BID) | ORAL | Status: DC
  Administered 2023-12-19 – 2023-12-22 (×8): 30 mg via ORAL
  Filled 2023-12-18 (×8): qty 3

## 2023-12-18 MED ORDER — ADULT MULTIVITAMIN W/MINERALS CH
1.0000 | ORAL_TABLET | Freq: Every day | ORAL | Status: DC
  Administered 2023-12-19 – 2023-12-22 (×4): 1 via ORAL
  Filled 2023-12-18 (×4): qty 1

## 2023-12-18 MED ORDER — ACETAMINOPHEN 325 MG PO TABS
650.0000 mg | ORAL_TABLET | Freq: Four times a day (QID) | ORAL | Status: DC | PRN
  Administered 2023-12-18 – 2023-12-22 (×3): 650 mg via ORAL
  Filled 2023-12-18 (×3): qty 2

## 2023-12-18 MED ORDER — TAMSULOSIN HCL 0.4 MG PO CAPS
0.4000 mg | ORAL_CAPSULE | Freq: Every day | ORAL | Status: DC
  Administered 2023-12-19 – 2023-12-22 (×4): 0.4 mg via ORAL
  Filled 2023-12-18 (×4): qty 1

## 2023-12-18 MED ORDER — FUROSEMIDE 10 MG/ML IJ SOLN
40.0000 mg | Freq: Two times a day (BID) | INTRAMUSCULAR | Status: AC
  Administered 2023-12-19 (×2): 40 mg via INTRAVENOUS
  Filled 2023-12-18 (×2): qty 4

## 2023-12-18 MED ORDER — SENNOSIDES-DOCUSATE SODIUM 8.6-50 MG PO TABS: 1.0000 | ORAL_TABLET | Freq: Every evening | ORAL | Status: DC | PRN

## 2023-12-18 MED ORDER — PREDNISONE 20 MG PO TABS
40.0000 mg | ORAL_TABLET | Freq: Every day | ORAL | Status: DC
  Administered 2023-12-19 – 2023-12-20 (×2): 40 mg via ORAL
  Filled 2023-12-18 (×2): qty 2

## 2023-12-18 MED ORDER — SODIUM CHLORIDE 0.9 % IV SOLN
500.0000 mg | Freq: Once | INTRAVENOUS | Status: AC
  Administered 2023-12-18: 500 mg via INTRAVENOUS
  Filled 2023-12-18: qty 5

## 2023-12-18 MED ORDER — ACETAMINOPHEN 500 MG PO TABS
500.0000 mg | ORAL_TABLET | Freq: Once | ORAL | Status: AC
  Administered 2023-12-18: 500 mg via ORAL
  Filled 2023-12-18: qty 1

## 2023-12-18 MED ORDER — POTASSIUM CHLORIDE 20 MEQ PO PACK
40.0000 meq | PACK | Freq: Once | ORAL | Status: AC
  Administered 2023-12-19: 40 meq via ORAL
  Filled 2023-12-18: qty 2

## 2023-12-18 MED ORDER — ACETAMINOPHEN 650 MG RE SUPP
650.0000 mg | Freq: Four times a day (QID) | RECTAL | Status: DC | PRN
Start: 2023-12-18 — End: 2023-12-22

## 2023-12-18 MED ORDER — LATANOPROST 0.005 % OP SOLN
1.0000 [drp] | Freq: Every day | OPHTHALMIC | Status: DC
  Administered 2023-12-19 – 2023-12-21 (×4): 1 [drp] via OPHTHALMIC
  Filled 2023-12-18 (×2): qty 2.5

## 2023-12-18 MED ORDER — PREDNISONE 20 MG PO TABS: 40.0000 mg | ORAL_TABLET | Freq: Every day | ORAL | Status: DC

## 2023-12-18 MED ORDER — CITALOPRAM HYDROBROMIDE 20 MG PO TABS
20.0000 mg | ORAL_TABLET | Freq: Every day | ORAL | Status: DC
  Administered 2023-12-19 – 2023-12-22 (×5): 20 mg via ORAL
  Filled 2023-12-18 (×5): qty 1

## 2023-12-18 MED ORDER — ATORVASTATIN CALCIUM 20 MG PO TABS
40.0000 mg | ORAL_TABLET | Freq: Every day | ORAL | Status: DC
  Administered 2023-12-19 – 2023-12-22 (×5): 40 mg via ORAL
  Filled 2023-12-18 (×5): qty 2

## 2023-12-18 MED ORDER — SODIUM CHLORIDE 0.9 % IV SOLN
2.0000 g | Freq: Once | INTRAVENOUS | Status: AC
  Administered 2023-12-18: 2 g via INTRAVENOUS
  Filled 2023-12-18: qty 20

## 2023-12-18 MED ORDER — METHYLPREDNISOLONE SODIUM SUCC 125 MG IJ SOLR
125.0000 mg | INTRAMUSCULAR | Status: AC
  Administered 2023-12-18: 125 mg via INTRAVENOUS
  Filled 2023-12-18: qty 2

## 2023-12-18 MED ORDER — QUETIAPINE FUMARATE 25 MG PO TABS
50.0000 mg | ORAL_TABLET | Freq: Every day | ORAL | Status: DC
  Administered 2023-12-19 – 2023-12-21 (×4): 50 mg via ORAL
  Filled 2023-12-18 (×4): qty 2

## 2023-12-18 MED ORDER — DAPAGLIFLOZIN PROPANEDIOL 10 MG PO TABS
10.0000 mg | ORAL_TABLET | Freq: Every day | ORAL | Status: DC
  Administered 2023-12-19 – 2023-12-22 (×4): 10 mg via ORAL
  Filled 2023-12-18 (×4): qty 1

## 2023-12-18 MED ORDER — IPRATROPIUM-ALBUTEROL 0.5-2.5 (3) MG/3ML IN SOLN
9.0000 mL | Freq: Once | RESPIRATORY_TRACT | Status: AC
  Administered 2023-12-18: 9 mL via RESPIRATORY_TRACT
  Filled 2023-12-18: qty 9

## 2023-12-18 MED ORDER — SODIUM CHLORIDE 0.9% FLUSH
3.0000 mL | Freq: Two times a day (BID) | INTRAVENOUS | Status: DC
  Administered 2023-12-18 – 2023-12-19 (×3): 3 mL via INTRAVENOUS

## 2023-12-18 MED ORDER — SODIUM CHLORIDE 0.9 % IV SOLN
10.0000 mL/h | Freq: Once | INTRAVENOUS | Status: AC
  Administered 2023-12-18: 10 mL/h via INTRAVENOUS

## 2023-12-18 MED ORDER — BUDESONIDE 0.5 MG/2ML IN SUSP
0.5000 mg | Freq: Two times a day (BID) | RESPIRATORY_TRACT | Status: DC
  Administered 2023-12-18 – 2023-12-22 (×8): 0.5 mg via RESPIRATORY_TRACT
  Filled 2023-12-18 (×8): qty 2

## 2023-12-18 MED ORDER — IPRATROPIUM-ALBUTEROL 0.5-2.5 (3) MG/3ML IN SOLN
3.0000 mL | Freq: Four times a day (QID) | RESPIRATORY_TRACT | Status: DC | PRN
  Administered 2023-12-19: 3 mL via RESPIRATORY_TRACT
  Filled 2023-12-18: qty 3

## 2023-12-18 MED ORDER — IOHEXOL 350 MG/ML SOLN
75.0000 mL | Freq: Once | INTRAVENOUS | Status: AC | PRN
  Administered 2023-12-18: 75 mL via INTRAVENOUS

## 2023-12-18 NOTE — ED Triage Notes (Signed)
 Patient arrives from home via [facility], with chief complaint of generalized weakness and shortness of breath. Patient states he was diagnosed with lung cancer a few days ago. Patient endorses moderate pain in ribcage when breathing in. Patient has not been able to ambulate around the house the last few days which is new for him, and endorses not eating or drinking as much as he normally does. Patient is on 4 L [place] O2 at home. Patient is A&Ox4 with respirations even and unlabored in triage.

## 2023-12-18 NOTE — ED Notes (Signed)
 Called CCMD to initiate cardiac monitoring.

## 2023-12-18 NOTE — Progress Notes (Signed)
 Received page from nurse regarding patient having a fever while getting blood transfusion.  Per report this was 30 minutes after transfusion started.  Patient did not have any other symptoms.  His temperature 2 hours prior to transfusion was 98.2 and 1 hour prior to transfusion was 99.8 and given diagnosis of pneumonia patient developed fever from that.  The nurse activated transfusion reaction protocol prior to contacting me so unable to resume transfusion.  Advised nurse to give antipyretic and once patient is afebrile will order another unit as less concern for acute hemolytic reaction.  Will get H&H in the interim.  Fever appears to be mediated by his pneumonia.  There can be nonhemolytic febrile reaction as well.  Given patient's hemoglobin and the fact that it is symptomatic, will order another transfusion once patient is afebrile.  Will monitor for any reactions closely.  Morene Bathe, MD [facility] DEL. [facility]

## 2023-12-18 NOTE — ED Provider Notes (Signed)
 [facility] Provider Note    Event Date/Time   First MD Initiated Contact with Patient 12/18/23 1337     (approximate)   History   Chief Complaint: Weakness   HPI  Todd Whitehead is a 78 y.o. male with a history of COPD on chronic 4 L nasal cannula oxygen, CHF, who comes ED complaining of worsening shortness of breath over the last 6 months, particularly worse over the last 1 month.  Denies chest pain or fever, no significant cough.  EMS report wheezing, gave IV magnesium .  Patient also reports his family was worried about a wound on his elbow.        Past Medical History:  Diagnosis Date   Anxiety    a.) on BZO (alprazolam ) PRN   Aortic atherosclerosis    Arthritis    ASD (atrial septal defect)    Bilateral carotid artery disease    BPH (benign prostatic hyperplasia)    CAD (coronary artery disease)    Carotid bruit    Chronic gastritis    Chronic lower back pain    Chronic respiratory failure (HCC)    a.) requires supplemental oxygen (2-3 L/[place]); non-compliant   Colon polyp    COPD (chronic obstructive pulmonary disease) (HCC)    Depression    Glaucoma    Heart murmur    HFrEF (heart failure with reduced ejection fraction) (HCC)    HLD (hyperlipidemia)    Hyperplastic colonic polyp    Hypertension    Long-term use of aspirin  therapy    Low serum vitamin B12 11/18/2020   Melanoma (LEFT upper arm)    Mesenteric vascular insufficiency    MGUS (monoclonal gammopathy of unknown significance)    Myocardial infarction (HCC) 07/2015   a.) type unclear; b.) LHC 08/19/2015: 80% mLM, 85% mLCx, 100% mRCA, 50% LM --> CVTS   Nodule of lower lobe of left lung    Nonspecific chest pain    On long term clopidogrel  therapy    Peripheral vascular disease    a.) s/p celiac artery stent placement   Prediabetes    RBBB (right bundle branch block)    Right inguinal hernia    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   a.) LIMA-LAD, SVG-OM1, SVG-dRCA with  endarterectomy   Supplemental oxygen dependent (2-3 L/[place])    Tobacco use    Tubular adenoma     Current Outpatient Rx   Order #: [id number] Class: Normal   Order #: [id number] Class: Normal   Order #: [id number] Class: Historical Med   Order #: [id number] Class: Historical Med   Order #: [id number] Class: Historical Med   Order #: [id number] Class: Historical Med   Order #: [id number] Class: Historical Med   Order #: [id number] Class: Historical Med   Order #: [id number] Class: Normal   Order #: [id number] Class: Print   Order #: [id number] Class: Historical Med   Order #: [id number] Class: Historical Med   Order #: [id number] Class: Print   Order #: [id number] Class: Historical Med   Order #: [id number] Class: Historical Med   Order #: [id number] Class: Normal   Order #: [id number] Class: Print   Order #: [id number] Class: Historical Med   Order #: [id number] Class: Historical Med   Order #: [id number] Class: Normal   Order #: [id number] Class: Historical Med    Past Surgical History:  Procedure Laterality Date   BACK SURGERY  01/19/1998   CARDIAC CATHETERIZATION N/A 08/19/2015   Procedure: Left Heart Cath and Coronary Angiography;  Surgeon: Vinie DELENA Jude, MD;  Location: [facility] IN[facility]IVE [facility];  Service: Cardiovascular;  Laterality: N/A;   CHOLECYSTECTOMY N/A 08/19/2016   Procedure: LAPAROSCOPIC CHOLECYSTECTOMY WITH INTRAOPERATIVE CHOLANGIOGRAM;  Surgeon: Aron Shoulders, MD;  Location: [facility] OR;  Service: General;  Laterality: N/A;   COLON SURGERY  age 80 days old    bowel was twisted up   COLONOSCOPY WITH PROPOFOL  N/A 03/19/2016   Procedure: COLONOSCOPY WITH PROPOFOL ;  Surgeon: Gladis RAYMOND Mariner, MD;  Location: [facility] ENDOSCOPY;  Service: Endoscopy;  Laterality: N/A;   CORONARY ARTERY BYPASS GRAFT N/A 09/09/2015   ERCP N/A 08/18/2016   Procedure: ENDOSCOPIC RETROGRADE CHOLANGIOPANCREATOGRAPHY (ERCP);  Surgeon: Saintclair Jasper, MD;  Location: [facility] ENDOSCOPY;  Service: Gastroenterology;  Laterality: N/A;    ESOPHAGOGASTRODUODENOSCOPY (EGD) WITH PROPOFOL  N/A 09/18/2018   Procedure: ESOPHAGOGASTRODUODENOSCOPY (EGD) WITH PROPOFOL ;  Surgeon: Tye Millet, DO;  Location: [facility] ENDOSCOPY;  Service: General;  Laterality: N/A;   EYE SURGERY Left 03/26/2011   FIBEROPTIC BRONCHOSCOPY Bilateral 03/18/2023   Procedure: BEDSIDE BRONCHOSCOPY FIBEROPTIC;  Surgeon: Parris Manna, MD;  Location: [facility] ORS;  Service: Thoracic;  Laterality: Bilateral;   FLEXIBLE BRONCHOSCOPY Bilateral 03/18/2023   Procedure: FLEXIBLE BRONCHOSCOPY;  Surgeon: Parris Manna, MD;  Location: [facility] ORS;  Service: Thoracic;  Laterality: Bilateral;   LOWER EXTREMITY ANGIOGRAPHY Left 10/03/2017   Procedure: LOWER EXTREMITY ANGIOGRAPHY;  Surgeon: Marea Selinda RAMAN, MD;  Location: [facility];  Service: Cardiovascular;  Laterality: Left;   LOWER EXTREMITY ANGIOGRAPHY Right 10/31/2017   Procedure: LOWER EXTREMITY ANGIOGRAPHY;  Surgeon: Marea Selinda RAMAN, MD;  Location: [facility];  Service: Cardiovascular;  Laterality: Right;   LUMBAR LAMINECTOMY/DECOMPRESSION MICRODISCECTOMY Right 10/02/2012   Procedure: Right Lumbar Five-Sacral One Lumbar laminotomy/Microdiskectomy;  Surgeon: Lamar LELON Peaches, MD;  Location: [facility]URO [facility];  Service: Neurosurgery;  Laterality: Right;  Right Lumbar Five-Sacral One Lumbar laminotomy/Microdiskectomy   VIDEO BRONCHOSCOPY WITH ENDOBRONCHIAL NAVIGATION N/A 11/29/2023   Procedure: VIDEO BRONCHOSCOPY WITH ENDOBRONCHIAL NAVIGATION;  Surgeon: Parris Manna, MD;  Location: [facility] ORS;  Service: Thoracic;  Laterality: N/A;   VIDEO BRONCHOSCOPY WITH ENDOBRONCHIAL ULTRASOUND N/A 11/29/2023   Procedure: BRONCHOSCOPY, WITH EBUS;  Surgeon: Parris Manna, MD;  Location: [facility] ORS;  Service: Thoracic;  Laterality: N/A;   VISCERAL ANGIOGRAPHY N/A 11/08/2020   Procedure: VISCERAL ANGIOGRAPHY;  Surgeon: Jama Cordella MATSU, MD;  Location: [facility];  Service: Cardiovascular;  Laterality: N/A;   VISCERAL  ANGIOGRAPHY N/A 11/25/2020   Procedure: VISCERAL ANGIOGRAPHY;  Surgeon: Jama Cordella MATSU, MD;  Location: [facility];  Service: Cardiovascular;  Laterality: N/A;    Physical Exam   Triage Vital Signs: ED Triage Vitals  Encounter Vitals Group     BP 12/18/23 1339 (!) 178/64     Girls Systolic BP Percentile --      Girls Diastolic BP Percentile --      Boys Systolic BP Percentile --      Boys Diastolic BP Percentile --      Pulse Rate 12/18/23 1339 97     Resp 12/18/23 1339 19     Temp 12/18/23 1339 98.5 F (36.9 C)     Temp Source 12/18/23 1339 Oral     SpO2 12/18/23 1339 95 %     Weight 12/18/23 1343 120 lb 12.8 oz (54.8 kg)     Height 12/18/23 1343 5' 8 (1.727 m)     Head Circumference --      Peak Flow --      Pain Score 12/18/23 1342 5     Pain Loc --      Pain Education --  Exclude from Growth Chart --     Most recent vital signs: Vitals:   12/18/23 1339  BP: (!) 178/64  Pulse: 97  Resp: 19  Temp: 98.5 F (36.9 C)  SpO2: 95%    General: Awake, no distress.  CV:  Good peripheral perfusion.  Regular rate and rhythm, normal radial pulse Resp:  Tachypnea.  Diffuse expiratory wheezing, bilateral lower crackles.  Prolonged expiratory phase. Abd:  No distention.  Soft nontender Other:  No lower extremity edema   ED Results / Procedures / Treatments   Labs (all labs ordered are listed, but only abnormal results are displayed) Labs Reviewed  COMPREHENSIVE METABOLIC PANEL WITH GFR - Abnormal; Notable for the following components:      Result Value   Glucose, Bld 164 (*)    Albumin  3.4 (*)    All other components within normal limits  PRO BRAIN NATRIURETIC PEPTIDE - Abnormal; Notable for the following components:   Pro Brain Natriuretic Peptide 5,709.0 (*)    All other components within normal limits  CBC WITH DIFFERENTIAL/PLATELET - Abnormal; Notable for the following components:   WBC 13.0 (*)    RBC 2.31 (*)    Hemoglobin 6.4 (*)    HCT  20.7 (*)    Neutro Abs 10.5 (*)    Monocytes Absolute 1.5 (*)    Abs Immature Granulocytes 0.11 (*)    All other components within normal limits  LACTIC ACID, PLASMA - Abnormal; Notable for the following components:   Lactic Acid, Venous 2.4 (*)    All other components within normal limits  TROPONIN T, HIGH SENSITIVITY - Abnormal; Notable for the following components:   Troponin T High Sensitivity 33 (*)    All other components within normal limits  RESP PANEL BY RT-PCR (RSV, FLU A&B, COVID)  [id number]  CULTURE, BLOOD (ROUTINE X 2)  CULTURE, BLOOD (ROUTINE X 2)  LACTIC ACID, PLASMA  PREPARE RBC (CROSSMATCH)  TYPE AND SCREEN     EKG Interpreted by me Sinus rhythm rate of 97.  Right axis, normal intervals.  Poor R wave progression.  1 PVC.   RADIOLOGY Chest x-ray interpreted by me, consistent with COPD, shows right lower lung opacity suggestive of pneumonia, though there was similar opacity on previous chest x-ray November 29, 2023.  Radiology report reviewed   PROCEDURES:  .Critical Care  Performed by: Viviann Pastor, MD Authorized by: Viviann Pastor, MD   Critical care provider statement:    Critical care time (minutes):  35   Critical care time was exclusive of:  Separately billable procedures and treating other patients   Critical care was necessary to treat or prevent imminent or life-threatening deterioration of the following conditions:  Respiratory failure, sepsis and circulatory failure   Critical care was time spent personally by me on the following activities:  Development of treatment plan with patient or surrogate, discussions with consultants, evaluation of patient's response to treatment, examination of patient, obtaining history from patient or surrogate, ordering and performing treatments and interventions, ordering and review of laboratory studies, ordering and review of radiographic studies, pulse oximetry, re-evaluation of patient's condition and review of  old charts   Care discussed with: admitting provider      MEDICATIONS ORDERED IN ED: Medications  0.9 %  sodium chloride  infusion (has no administration in time range)  cefTRIAXone  (ROCEPHIN ) 2 g in sodium chloride  0.9 % 100 mL IVPB (has no administration in time range)  azithromycin  (ZITHROMAX ) 500 mg in sodium chloride  0.9 %  250 mL IVPB (has no administration in time range)  methylPREDNISolone  sodium succinate (SOLU-MEDROL ) 125 mg/2 mL injection 125 mg (125 mg Intravenous Given 12/18/23 1359)  ipratropium-albuterol  (DUONEB) 0.5-2.5 (3) MG/3ML nebulizer solution 9 mL (9 mLs Nebulization Given 12/18/23 1400)     IMPRESSION / MDM / ASSESSMENT AND PLAN / ED COURSE  I reviewed the triage vital signs and the nursing notes.  DDx: COPD exacerbation, pneumonia, COVID, influenza, non-STEMI, heart failure/pulmonary edema, anemia  Patient's presentation is most consistent with acute presentation with potential threat to life or bodily function.  Patient presents with worsening shortness of breath, may be progression of his end-stage COPD.  Will obtain labs, chest x-ray.   ----------------------------------------- 3:04 PM on 12/18/2023 ----------------------------------------- Patient reports continued shortness of breath, no symptomatic improvement with steroids, bronchodilators, IV magnesium  bolus.  Labs show elevated proBNP.  Elevated lactic acid.  Mild leukocytosis.  Acute on chronic anemia with hemoglobin 6.4 today.  Will give 1 unit red blood cell transfusion for symptomatic anemia.  Start ceftriaxone  and azithromycin  for possible kidney acquired pneumonia and COPD exacerbation.  Obtain CTA chest to evaluate for PE.  Send cultures for suspected sepsis, though there are no signs of septic shock.  Will plan to admit.      FINAL CLINICAL IMPRESSION(S) / ED DIAGNOSES   Final diagnoses:  COPD with acute exacerbation (HCC)  Symptomatic anemia  Chronic respiratory failure with hypoxia (HCC)      Rx / DC Orders   ED Discharge Orders     None        Note:  This document was prepared using Dragon voice recognition software and may include unintentional dictation errors.   Viviann Pastor, MD 12/18/23 [phone]

## 2023-12-18 NOTE — H&P (Addendum)
 History and Physical    Todd Whitehead FMW:[id number] DOB: [birth year] DOA: 12/18/2023  DOS: the patient was seen and examined on 12/18/2023  PCP: Lenon Layman ORN, MD   Patient coming from: Home  I have personally briefly reviewed patient's old medical records in [facility] and CareEverywhere  HPI:   Todd Whitehead is a 78 y.o. year old male with medical history of hypertension, hyperlipidemia, type 2 diabetes, COPD, CHF, chronic hypoxic respiratory failure on 4 L, recent diagnosis of localized non-small cell lung cancer presented to the ED with generalized weakness and shortness of breath.   Pt states he has been having weakness for the last week but significantly worse over the last 2 days. He bumped his arm on door handle and had injured his arm. He has been bleeding since Sunday and it has been non-stop. They went to PCP on Monday and had bandage placed but the bleeding continued until this morning. He has been having some worsening coughing with light yellow mucus production. No fevers or chills.    On arrival to the ED patient was noted to be HDS stable.  Lab work and imaging obtained.  CBC with mild leukocytosis at 13K, significant anemia at 6.4 with baseline around 9-10, CMP relatively unremarkable except mild hyperglycemia and hypoalbuminemia.  BNP elevated at 5.7 K, lactic acid initially elevated at 2.4 with repeat pending.  Chest x-ray concerning for right lower lobe pneumonia.  EDP started CAP coverage and treatment for COPD exacerbation and ordered a CTA to rule out PE.  Given significant anemia, TR[facility] contacted for admission.  Review of Systems: As mentioned in the history of present illness. All other systems reviewed and are negative.   Past Medical History:  Diagnosis Date   Anxiety    a.) on BZO (alprazolam ) PRN   Aortic atherosclerosis    Arthritis    ASD (atrial septal defect)    Bilateral carotid artery disease    BPH (benign prostatic hyperplasia)     CAD (coronary artery disease)    Carotid bruit    Chronic gastritis    Chronic lower back pain    Chronic respiratory failure (HCC)    a.) requires supplemental oxygen (2-3 L/[place]); non-compliant   Colon polyp    COPD (chronic obstructive pulmonary disease) (HCC)    Depression    Glaucoma    Heart murmur    HFrEF (heart failure with reduced ejection fraction) (HCC)    HLD (hyperlipidemia)    Hyperplastic colonic polyp    Hypertension    Long-term use of aspirin  therapy    Low serum vitamin B12 11/18/2020   Melanoma (LEFT upper arm)    Mesenteric vascular insufficiency    MGUS (monoclonal gammopathy of unknown significance)    Myocardial infarction (HCC) 07/2015   a.) type unclear; b.) LHC 08/19/2015: 80% mLM, 85% mLCx, 100% mRCA, 50% LM --> CVTS   Nodule of lower lobe of left lung    Nonspecific chest pain    On long term clopidogrel  therapy    Peripheral vascular disease    a.) s/p celiac artery stent placement   Prediabetes    RBBB (right bundle branch block)    Right inguinal hernia    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   a.) LIMA-LAD, SVG-OM1, SVG-dRCA with endarterectomy   Supplemental oxygen dependent (2-3 L/[place])    Tobacco use    Tubular adenoma     Past Surgical History:  Procedure Laterality Date   BACK SURGERY  01/19/1998   CARDIAC CATHETERIZATION N/A 08/19/2015   Procedure: Left Heart Cath and Coronary Angiography;  Surgeon: Vinie DELENA Jude, MD;  Location: [facility] INVASIVE CV LAB;  Service: Cardiovascular;  Laterality: N/A;   CHOLECYSTECTOMY N/A 08/19/2016   Procedure: LAPAROSCOPIC CHOLECYSTECTOMY WITH INTRAOPERATIVE CHOLANGIOGRAM;  Surgeon: Aron Shoulders, MD;  Location: [facility] OR;  Service: General;  Laterality: N/A;   COLON SURGERY  age 72 days old    bowel was twisted up   COLONOSCOPY WITH PROPOFOL  N/A 03/19/2016   Procedure: COLONOSCOPY WITH PROPOFOL ;  Surgeon: Gladis RAYMOND Mariner, MD;  Location: [facility] ENDOSCOPY;  Service: Endoscopy;  Laterality: N/A;   CORONARY ARTERY BYPASS  GRAFT N/A 09/09/2015   ERCP N/A 08/18/2016   Procedure: ENDOSCOPIC RETROGRADE CHOLANGIOPANCREATOGRAPHY (ERCP);  Surgeon: Saintclair Jasper, MD;  Location: [facility] ENDOSCOPY;  Service: Gastroenterology;  Laterality: N/A;   ESOPHAGOGASTRODUODENOSCOPY (EGD) WITH PROPOFOL  N/A 09/18/2018   Procedure: ESOPHAGOGASTRODUODENOSCOPY (EGD) WITH PROPOFOL ;  Surgeon: Tye Millet, DO;  Location: [facility] ENDOSCOPY;  Service: General;  Laterality: N/A;   EYE SURGERY Left 03/26/2011   FIBEROPTIC BRONCHOSCOPY Bilateral 03/18/2023   Procedure: BEDSIDE BRONCHOSCOPY FIBEROPTIC;  Surgeon: Parris Manna, MD;  Location: [facility] ORS;  Service: Thoracic;  Laterality: Bilateral;   FLEXIBLE BRONCHOSCOPY Bilateral 03/18/2023   Procedure: FLEXIBLE BRONCHOSCOPY;  Surgeon: Parris Manna, MD;  Location: [facility] ORS;  Service: Thoracic;  Laterality: Bilateral;   LOWER EXTREMITY ANGIOGRAPHY Left 10/03/2017   Procedure: LOWER EXTREMITY ANGIOGRAPHY;  Surgeon: Marea Selinda RAMAN, MD;  Location: [facility] INVASIVE CV LAB;  Service: Cardiovascular;  Laterality: Left;   LOWER EXTREMITY ANGIOGRAPHY Right 10/31/2017   Procedure: LOWER EXTREMITY ANGIOGRAPHY;  Surgeon: Marea Selinda RAMAN, MD;  Location: [facility] INVASIVE CV LAB;  Service: Cardiovascular;  Laterality: Right;   LUMBAR LAMINECTOMY/DECOMPRESSION MICRODISCECTOMY Right 10/02/2012   Procedure: Right Lumbar Five-Sacral One Lumbar laminotomy/Microdiskectomy;  Surgeon: Lamar LELON Peaches, MD;  Location: [facility] NEURO ORS;  Service: Neurosurgery;  Laterality: Right;  Right Lumbar Five-Sacral One Lumbar laminotomy/Microdiskectomy   VIDEO BRONCHOSCOPY WITH ENDOBRONCHIAL NAVIGATION N/A 11/29/2023   Procedure: VIDEO BRONCHOSCOPY WITH ENDOBRONCHIAL NAVIGATION;  Surgeon: Parris Manna, MD;  Location: [facility] ORS;  Service: Thoracic;  Laterality: N/A;   VIDEO BRONCHOSCOPY WITH ENDOBRONCHIAL ULTRASOUND N/A 11/29/2023   Procedure: BRONCHOSCOPY, WITH EBUS;  Surgeon: Parris Manna, MD;  Location: [facility] ORS;  Service: Thoracic;  Laterality:  N/A;   VISCERAL ANGIOGRAPHY N/A 11/08/2020   Procedure: VISCERAL ANGIOGRAPHY;  Surgeon: Jama Cordella MATSU, MD;  Location: [facility] INVASIVE CV LAB;  Service: Cardiovascular;  Laterality: N/A;   VISCERAL ANGIOGRAPHY N/A 11/25/2020   Procedure: VISCERAL ANGIOGRAPHY;  Surgeon: Jama Cordella MATSU, MD;  Location: [facility] INVASIVE CV LAB;  Service: Cardiovascular;  Laterality: N/A;     Allergies  Allergen Reactions   Other Nausea And Vomiting    Cheese,butter,sour cream,cottage cheese   Poison Ivy Extract Rash   Poison Oak Extract Rash    Family History  Problem Relation Age of Onset   Heart attack Father    Aneurysm Brother    Lung cancer Brother     Prior to Admission medications   Medication Sig Start Date End Date Taking? Authorizing Provider  acetaminophen  (TYLENOL ) 500 MG tablet Take 1 tablet (500 mg total) by mouth every 6 (six) hours as needed. 08/20/16   Mckinley Ledger, MD  albuterol  (VENTOLIN  HFA) 108 (90 Base) MCG/ACT inhaler Inhale 2 puffs into the lungs every 6 (six) hours as needed for wheezing or shortness of breath. 11/09/23   Josette Ade, MD  ALPRAZolam  (XANAX ) 0.25 MG tablet Take 0.25  mg by mouth at bedtime as needed for sleep.    [provider]  aspirin  EC 81 MG tablet Take 81 mg by mouth daily. Swallow whole.    [provider]  atorvastatin  (LIPITOR) 40 MG tablet Take 40 mg by mouth daily.  05/22/17   [provider]  brimonidine  (ALPHAGAN  P) 0.1 % SOLN Place 1 drop into both eyes 2 (two) times daily. 09/10/23   [provider]  busPIRone  (BUSPAR ) 30 MG tablet Take 30 mg by mouth 2 (two) times daily.    [provider]  citalopram  (CELEXA ) 20 MG tablet Take 20 mg by mouth daily. 10/28/19   [provider]  clopidogrel  (PLAVIX ) 75 MG tablet TAKE 1 TABLET(75 MG) BY MOUTH DAILY 11/07/21   Brown, Fallon E, NP  dapagliflozin  propanediol (FARXIGA ) 10 MG TABS tablet Take 1 tablet (10 mg total) by mouth daily before breakfast.  11/13/21   Donette Ellouise LABOR, FNP  latanoprost  (XALATAN ) 0.005 % ophthalmic solution Place 1 drop into both eyes at bedtime.  06/14/17   [provider]  metoprolol  succinate (TOPROL -XL) 25 MG 24 hr tablet Take 12.5 mg by mouth daily.    [provider]  Multiple Vitamin (MULTIVITAMIN WITH MINERALS) TABS tablet Take 1 tablet by mouth daily. 01/31/21   Patel, Sona, MD  Multiple Vitamins-Minerals (CENTRUM ADULT 50+ MULTIGUMMIES PO) Take 1 tablet by mouth daily.    [provider]  QUEtiapine  (SEROQUEL ) 50 MG tablet Take 50 mg by mouth at bedtime.    [provider]  sacubitril -valsartan  (ENTRESTO ) 24-26 MG Take 1 tablet by mouth 2 (two) times daily. 10/01/23   Donette Ellouise LABOR, FNP  tamsulosin  (FLOMAX ) 0.4 MG CAPS capsule Take 1 capsule (0.4 mg total) by mouth daily after breakfast. 08/20/16   Mckinley Ledger, MD  traMADol  (ULTRAM ) 50 MG tablet Take 25-50 mg by mouth 3 (three) times daily as needed.    [provider]  traZODone  (DESYREL ) 50 MG tablet Take 50 mg by mouth at bedtime. 06/02/19   [provider]  umeclidinium-vilanterol (ANORO ELLIPTA) 62.5-25 MCG/ACT AEPB Inhale 1 puff into the lungs daily. Patient not taking: Reported on 12/17/2023 11/09/23   Josette Ade, MD  varenicline (CHANTIX) 0.5 MG tablet Take 0.5 mg by mouth 2 (two) times daily. 10/07/23   [provider]    Social History:  reports that he has been smoking cigarettes. He has a 58 pack-year smoking history. He has never used smokeless tobacco. He reports that he does not drink alcohol and does not use drugs. Lives with daughter and son in law Tobacco- smoking half a pack daily EtOH- Denies use.  Illicit drug use- denies use.  IADLs/ADLs- can perform independently at baseline    Physical Exam: Vitals:   12/18/23 1936 12/18/23 2110 12/18/23 2308 12/18/23 2323  BP:  (!) 166/85 (!) 149/60 (!) 141/64  Pulse:  (!) 105 88 85  Resp:  20 20 20   Temp: (!) 102.3 F (39.1 C)  99.2 F (37.3 C) 99.4 F (37.4 C) 98.3 F (36.8 C)  TempSrc: Oral Oral Oral Oral  SpO2:  96% 96% 97%  Weight:      Height:         Gen: chronically  ill appearing male HENT: [place] in place CV:RRR, good pulses in all extremities, 1+ LE edema Resp: decreased breath sounds in bases Abd: no TTP, normal bowel sounds MSK: no asymmetry Skin:LUE with skin laceration slight bleeding. Neuro: alert and oriented x4 Psych: pleasant  mood   Labs on Admission: I have personally reviewed following labs and imaging studies  CBC: Recent Labs  Lab 12/18/23 1354 12/18/23 1959  WBC 13.0*  --   NEUTROABS 10.5*  --   HGB 6.4* 6.6*  HCT 20.7* 20.1*  MCV 89.6  --   PLT 259  --    Basic Metabolic Panel: Recent Labs  Lab 12/18/23 1354  NA 138  K 3.8  CL 101  CO2 30  GLUCOSE 164*  BUN 17  CREATININE 1.03  CALCIUM  9.1  MG 3.2*   GFR: Estimated Creatinine Clearance: 45.8 mL/min (by C-G formula based on SCr of 1.03 mg/dL). Liver Function Tests: Recent Labs  Lab 12/18/23 1354  AST 35  ALT 28  ALKPHOS 119  BILITOT 0.4  PROT 6.5  ALBUMIN  3.4*   No results for input(s): LIPASE, AMYLASE in the last 168 hours. No results for input(s): AMMONIA in the last 168 hours. Coagulation Profile: No results for input(s): INR, PROTIME in the last 168 hours. Cardiac Enzymes: No results for input(s): CKTOTAL, CKMB, CKMBINDEX, TROPONINI, TROPONINIHS in the last 168 hours. BNP (last 3 results) No results for input(s): BNP in the last 8760 hours. HbA1C: No results for input(s): HGBA1C in the last 72 hours. CBG: No results for input(s): GLUCAP in the last 168 hours. Lipid Profile: No results for input(s): CHOL, HDL, LDLCALC, TRIG, CHOLHDL, LDLDIRECT in the last 72 hours. Thyroid  Function Tests: No results for input(s): TSH, T4TOTAL, FREET4, T3FREE, THYROIDAB in the last 72 hours. Anemia Panel: No results for input(s): VITAMINB12, FOLATE,  FERRITIN, TIBC, IRON, RETICCTPCT in the last 72 hours. Urine analysis:    Component Value Date/Time   COLORURINE YELLOW (A) 08/17/2016 0533   APPEARANCEUR Clear 12/07/2019 1028   LABSPEC 1.009 08/17/2016 0533   PHURINE 6.0 08/17/2016 0533   GLUCOSEU Negative 12/07/2019 1028   HGBUR MODERATE (A) 08/17/2016 0533   BILIRUBINUR Negative 12/07/2019 1028   KETONESUR NEGATIVE 08/17/2016 0533   PROTEINUR 2+ (A) 12/07/2019 1028   PROTEINUR 30 (A) 08/17/2016 0533   NITRITE Negative 12/07/2019 1028   NITRITE NEGATIVE 08/17/2016 0533   LEUKOCYTESUR Negative 12/07/2019 1028    Radiological Exams on Admission: I have personally reviewed images CT Angio Chest PE W and/or Wo Contrast Result Date: 12/18/2023 CLINICAL DATA:  Shortness of breath.  Diagnosis of lung cancer. EXAM: CT ANGIOGRAPHY CHEST WITH CONTRAST TECHNIQUE: Multidetector CT imaging of the chest was performed using the standard protocol during bolus administration of intravenous contrast. Multiplanar CT image reconstructions and MIPs were obtained to evaluate the vascular anatomy. RADIATION DOSE REDUCTION: This exam was performed according to the departmental dose-optimization program which includes automated exposure control, adjustment of the mA and/or kV according to patient size and/or use of iterative reconstruction technique. CONTRAST:  75mL OMNIPAQUE  IOHEXOL  350 MG/ML SOLN COMPARISON:  CT chest 11/26/2023. FINDINGS: Cardiovascular: Satisfactory opacification of the pulmonary arteries to the segmental level. No evidence of pulmonary embolism. Normal heart size. No pericardial effusion. The aorta is ectatic. Patient is status post cardiac surgery. There are atherosclerotic calcifications of the aorta. Mediastinum/Nodes: Enlarged right hilar lymph node measures 15 mm. Enlarged left hilar lymph node measures 11 mm. Visualized esophagus and thyroid  gland are within normal limits. Lungs/Pleura: Moderate emphysema is again seen.  Pleural-parenchymal scarring in the lung apices appears stable. There is right lower lobe airspace consolidation which is new from prior. There is a small right pleural effusion similar to prior. There are additional scattered tree-in-bud opacities throughout the bilateral  upper lobes and minimally in the left lower lobe. Spiculated nodular density in the left lower lobe measures 9 mm in appears unchanged from prior. Upper Abdomen: No acute abnormality. Musculoskeletal: Sternotomy wires are present. Review of the MIP images confirms the above findings. IMPRESSION: 1. No evidence for pulmonary embolism. 2. New right lower lobe airspace consolidation worrisome for pneumonia. 3. New bilateral hilar lymphadenopathy, likely reactive. 4. Stable small right pleural effusion. 5. Stable spiculated nodular density in the left lower lobe measuring 9 mm, indeterminate. Aortic Atherosclerosis (ICD10-I70.0) and Emphysema (ICD10-J43.9). Electronically Signed   By: Greig Pique M.D.   On: 12/18/2023 16:45   DG Chest Portable 1 View Result Date: 12/18/2023 CLINICAL DATA:  Shortness of breath.  History of emphysema. EXAM: PORTABLE CHEST 1 VIEW COMPARISON:  Numerous prior imaging studies. The most recent chest x-rays 11/29/2023 FINDINGS: Advanced emphysematous changes with marked hyperinflation bullous changes and pulmonary scarring. Superimposed patchy airspace opacity in the right mid and lower lungs is worrisome for superimposed pneumonia. Suspect small right pleural effusion also. No pneumothorax. No obvious pulmonary lesions. The bony thorax is intact. IMPRESSION: 1. Advanced emphysematous changes and pulmonary scarring. 2. Superimposed patchy airspace opacity in the right mid and lower lungs worrisome for superimposed pneumonia. Electronically Signed   By: MYRTIS Stammer M.D.   On: 12/18/2023 14:27    EKG: My personal interpretation of EKG shows: Sinus rhythm without any acute ST changes  Assessment/Plan Principal  Problem:   Symptomatic anemia Active Problems:   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)   Diabetes mellitus, type II (HCC)   PAD (peripheral artery disease)   COPD (chronic obstructive pulmonary disease) with chronic bronchitis (HCC)   Hyperlipidemia   Depression with anxiety   CHF (congestive heart failure) (HCC)   Protein-calorie malnutrition, severe   CAP (community acquired pneumonia)   CKD (chronic kidney disease) stage 3, GFR 30-59 ml/min (HCC)   COPD exacerbation (HCC)   Non-small cell lung cancer (NSCLC) (HCC)   Pt with symptomatic anemia with hgb at 6.4 with baseline around 9. He has skin tear from injury that is at stage 2b-3. Given the report by family and pt this can explain his drop in hemoglobin. No hematemesis, or melena reported by the patient. He is getting one unit pRBC.  Will trend of hemoglobin and if it continues to drop we will look for other causes but currently I believe his injury with the amount of blood loss reported explains the drop in hemoglobin. The lesion has stopped bleeding. Maintain 2 PIV, monitor H/H qhrs.  Holding blood thinners until hemoglobin stabilizes. Getting iron studies prior to pRBC.   Left upper extremity laceration: bleeding has subsided. It appears pt was prescribed keflex for a potential infection. Will stop this give pt is getting on Rocephin  and azithromycin . Wound consult placed.   CAP: Patient with worsening cough and sputum production with x-ray showing concern for pneumonia.  This is redemonstrated by CT.  Continue Rocephin  and azithromycin .  Monitor leukocyte count, fever curve and blood cultures.  COPD exacerbation: given dyspnea and worsening cough with sputum production. Will give 5 days of prednisone . Likely exacerbated by CAP  CHF: Has mild exacerbation. Will give IV lasix  given elevated BNP and lower extremity edema.  Monitor electrolytes and replete as needed.  Strict input and output and daily weights.  The pleural effusion  should decrease with diuresis.  Hyperlipidemia: Continue home statin  Tobacco use disorder: Discussed complete cessation given active cancer.  Patient  would like to think about this.  Declines NRT.  Non-small lung cancer: Recently diagnosed.  Patient to get radiation therapy.  They asked me regarding prognosis for this.  I did defer this to pulmonology and oncology but did advise patient that prognosis is typically uncertain as patient is currently smoking and can develop recurrence or new cancers.   MDD/GAD: continue home meds   Protein Calorie malnutrition: will consult RD given cancer diagnosis.   VTE prophylaxis:  SCD Diet: HH Code Status:  DNR/DNI Telemetry:  Admission status: Inpatient, Progressive Patient is from: Home Anticipated d/c is to: Home Anticipated d/c is in: 2-3 days   Family Communication: Updated at bedside  Consults called: None   Severity of Illness: The appropriate patient status for this patient is INPATIENT. Inpatient status is judged to be reasonable and necessary in order to provide the required intensity of service to ensure the patient's safety. The patient's presenting symptoms, physical exam findings, and initial radiographic and laboratory data in the context of their chronic comorbidities is felt to place them at high risk for further clinical deterioration. Furthermore, it is not anticipated that the patient will be medically stable for discharge from the hospital within 2 midnights of admission.   * I certify that at the point of admission it is my clinical judgment that the patient will require inpatient hospital care spanning beyond 2 midnights from the point of admission due to high intensity of service, high risk for further deterioration and high frequency of surveillance required.DEWAINE Morene Bathe, MD [place] DEL. [facility]

## 2023-12-19 DIAGNOSIS — J189 Pneumonia, unspecified organism: Secondary | ICD-10-CM | POA: Diagnosis not present

## 2023-12-19 DIAGNOSIS — F418 Other specified anxiety disorders: Secondary | ICD-10-CM

## 2023-12-19 DIAGNOSIS — I2510 Atherosclerotic heart disease of native coronary artery without angina pectoris: Secondary | ICD-10-CM | POA: Diagnosis not present

## 2023-12-19 DIAGNOSIS — D649 Anemia, unspecified: Secondary | ICD-10-CM | POA: Diagnosis not present

## 2023-12-19 LAB — IRON AND TIBC
Iron: <10 ug/dL — ABNORMAL LOW (ref 45–182)
TIBC: 269 ug/dL (ref 250–450)

## 2023-12-19 LAB — CBC
HCT: 26.6 % — ABNORMAL LOW (ref 39.0–52.0)
Hemoglobin: 8.6 g/dL — ABNORMAL LOW (ref 13.0–17.0)
MCH: 27.4 pg (ref 26.0–34.0)
MCHC: 32.3 g/dL (ref 30.0–36.0)
MCV: 84.7 fL (ref 80.0–100.0)
Platelets: 312 K/uL (ref 150–400)
RBC: 3.14 MIL/uL — ABNORMAL LOW (ref 4.22–5.81)
RDW: 14.5 % (ref 11.5–15.5)
WBC: 19.7 K/uL — ABNORMAL HIGH (ref 4.0–10.5)
nRBC: 0 % (ref 0.0–0.2)

## 2023-12-19 LAB — URINALYSIS, COMPLETE (UACMP) WITH MICROSCOPIC
Bacteria, UA: NONE SEEN
Bilirubin Urine: NEGATIVE
Glucose, UA: 50 mg/dL — AB
Hgb urine dipstick: NEGATIVE
Ketones, ur: NEGATIVE mg/dL
Leukocytes,Ua: NEGATIVE
Nitrite: NEGATIVE
Protein, ur: 30 mg/dL — AB
Specific Gravity, Urine: 1.017 (ref 1.005–1.030)
Squamous Epithelial / HPF: 0 /HPF (ref 0–5)
pH: 5 (ref 5.0–8.0)

## 2023-12-19 LAB — TYPE AND SCREEN
ABO/RH(D): O POS
Antibody Screen: NEGATIVE
Unit division: 0
Unit division: 0

## 2023-12-19 LAB — BASIC METABOLIC PANEL WITH GFR
Anion gap: 11 (ref 5–15)
BUN: 16 mg/dL (ref 8–23)
CO2: 31 mmol/L (ref 22–32)
Calcium: 9.1 mg/dL (ref 8.9–10.3)
Chloride: 99 mmol/L (ref 98–111)
Creatinine, Ser: 1.07 mg/dL (ref 0.61–1.24)
GFR, Estimated: >60 mL/min (ref >60)
Glucose, Bld: 156 mg/dL — ABNORMAL HIGH (ref 70–99)
Potassium: 4.1 mmol/L (ref 3.5–5.1)
Sodium: 140 mmol/L (ref 135–145)

## 2023-12-19 LAB — GLUCOSE, CAPILLARY
Glucose-Capillary: 165 mg/dL — ABNORMAL HIGH (ref 70–99)
Glucose-Capillary: 233 mg/dL — ABNORMAL HIGH (ref 70–99)
Glucose-Capillary: 130 mg/dL — ABNORMAL HIGH (ref 70–99)

## 2023-12-19 LAB — BPAM RBC
Blood Product Expiration Date: 202512122359
Blood Product Expiration Date: 202512192359
ISSUE DATE / TIME: 202511191749
ISSUE DATE / TIME: 202511192249
Unit Type and Rh: 5100
Unit Type and Rh: 5100

## 2023-12-19 LAB — LACTIC ACID, PLASMA
Lactic Acid, Venous: 1 mmol/L (ref 0.5–1.9)
Lactic Acid, Venous: 1.3 mmol/L (ref 0.5–1.9)

## 2023-12-19 LAB — TRANSFUSION REACTION
DAT C3: NEGATIVE
Post RXN DAT IgG: NEGATIVE

## 2023-12-19 LAB — FERRITIN: Ferritin: 220 ng/mL (ref 24–336)

## 2023-12-19 MED ORDER — ENSURE PLUS HIGH PROTEIN PO LIQD
237.0000 mL | Freq: Three times a day (TID) | ORAL | Status: DC
  Administered 2023-12-19 – 2023-12-21 (×6): 237 mL via ORAL

## 2023-12-19 MED ORDER — ALPRAZOLAM 0.25 MG PO TABS
0.2500 mg | ORAL_TABLET | Freq: Every evening | ORAL | Status: DC | PRN
  Administered 2023-12-19 – 2023-12-20 (×2): 0.25 mg via ORAL
  Filled 2023-12-19 (×2): qty 1

## 2023-12-19 MED ORDER — TRAZODONE HCL 50 MG PO TABS
50.0000 mg | ORAL_TABLET | Freq: Every day | ORAL | Status: DC
  Administered 2023-12-19 – 2023-12-21 (×3): 50 mg via ORAL
  Filled 2023-12-19 (×3): qty 1

## 2023-12-19 MED ORDER — SODIUM CHLORIDE 0.9 % IV SOLN
2.0000 g | INTRAVENOUS | Status: AC
  Administered 2023-12-19 – 2023-12-22 (×4): 2 g via INTRAVENOUS
  Filled 2023-12-19 (×4): qty 20

## 2023-12-19 MED ORDER — AZITHROMYCIN 250 MG PO TABS
500.0000 mg | ORAL_TABLET | Freq: Every day | ORAL | Status: AC
  Administered 2023-12-19 – 2023-12-20 (×2): 500 mg via ORAL
  Filled 2023-12-19 (×2): qty 2

## 2023-12-19 NOTE — TOC Initial Note (Signed)
 Transition of Care [facility]) - Initial/Assessment Note    Patient Details  Name: Todd Whitehead MRN: [id number] Date of Birth: 1945/10/21  Transition of [facility]) CM/SW Contact:    Lauraine [name], LCSW Phone Number: 12/19/2023, 2:08 PM  Clinical Narrative:   Readmission prevention screen complete. CSW met with patient. No family at bedside. CSW introduced role and explained that discharge planning would be discussed. PCP is Layman Piety, MD. Son-in-law drives him to appointments. Pharmacy is [facility] in [place]. Patient reports difficulty affording all of his medications. CSW notified the unit pharmacist so he can determine if there are any cheaper alternatives. Patient lives home with daughter, son-in-law grandson, granddaughter, and two art gallery manager. No home health or DME use prior to admission other than 4 L oxygen ([employer]). No further concerns. CSW will continue to follow patient for support and facilitate return home once stable. One of his family members will transport him home at discharge.               Expected Discharge Plan: Home/Self Care Barriers to Discharge: Continued Medical Work up   Patient Goals and CMS Choice            Expected Discharge Plan and Services     Post Acute Care Choice: NA Living arrangements for the past 2 months: Single Family Home                                      Prior Living Arrangements/Services Living arrangements for the past 2 months: Single Family Home Lives with:: Adult Children, Relatives Patient language and need for interpreter reviewed:: Yes Do you feel safe going back to the place where you live?: Yes      Need for Family Participation in Patient Care: Yes (Comment) Care giver support system in place?: Yes (comment)   Criminal Activity/Legal Involvement Pertinent to Current Situation/Hospitalization: No - Comment as needed  Activities of Daily Living   ADL Screening (condition at time of  admission) Independently performs ADLs?: Yes (appropriate for developmental age) Is the patient deaf or have difficulty hearing?: No Does the patient have difficulty seeing, even when wearing glasses/contacts?: No Does the patient have difficulty concentrating, remembering, or making decisions?: No  Permission Sought/Granted                  Emotional Assessment Appearance:: Appears stated age Attitude/Demeanor/Rapport: Engaged, Gracious Affect (typically observed): Accepting, Appropriate, Calm, Pleasant Orientation: : Oriented to Self, Oriented to Place, Oriented to  Time, Oriented to Situation Alcohol / Substance Use: Not Applicable Psych Involvement: No (comment)  Admission diagnosis:  Chronic respiratory failure with hypoxia (HCC) [J96.11] COPD with acute exacerbation (HCC) [J44.1] Symptomatic anemia [D64.9] Acute hypoxic respiratory failure (HCC) [J96.01] Patient Active Problem List   Diagnosis Date Noted   Non-small cell lung cancer (NSCLC) (HCC) 12/18/2023   Acute on chronic hypoxic respiratory failure (HCC) 11/08/2023   NSVT (nonsustained ventricular tachycardia) (HCC) 11/06/2023   COPD exacerbation (HCC) 11/06/2023   COPD with acute exacerbation (HCC) 11/05/2023   AKI (acute kidney injury) 11/05/2023   GAD (generalized anxiety disorder) 11/05/2023   Underweight (BMI < 18.5) 11/05/2023   Multifocal pneumonia 03/16/2023   CKD (chronic kidney disease) stage 3, GFR 30-59 ml/min (HCC) 08/27/2022   MGUS (monoclonal gammopathy of unknown significance) 05/17/2021   Chronic mesenteric ischemia 03/23/2021   Chronic HFrEF with improved EF (45%-> 55%) (heart failure with reduced  ejection fraction) (HCC) 01/26/2021   Hypertensive urgency 01/26/2021   HLD (hyperlipidemia) 01/26/2021   COPD (chronic obstructive pulmonary disease) (HCC) 01/26/2021   Tobacco use disorder 01/26/2021   Hemoptysis 01/26/2021   Hypokalemia 01/26/2021   Lung nodule 01/26/2021   Elevated troponin  01/26/2021   Normocytic anemia 01/26/2021   Protein-calorie malnutrition, severe 01/26/2021   CAP (community acquired pneumonia) 01/26/2021   SOB (shortness of breath)    Hypoxia    Symptomatic anemia    CHF (congestive heart failure) (HCC) 12/09/2020   Anemia 11/18/2020   Low serum vitamin B12 11/18/2020   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   Hyperlipidemia 02/07/2018   Epistaxis 11/08/2017   PAD (peripheral artery disease) 09/28/2017   Hypertension 06/25/2017   Abdominal pain 06/25/2017   Aortic atherosclerosis 06/25/2017   Diabetes mellitus, type II (HCC) 12/24/2016   Bile duct calculus with acute cholecystitis 08/17/2016   Acute cholecystitis    Personal history of tobacco use, presenting hazards to health 07/08/2016   Health care maintenance 12/19/2015   Elevated LFTs 10/10/2015   Edema of right lower extremity 10/10/2015   COPD (chronic obstructive pulmonary disease) with chronic bronchitis (HCC) 10/04/2015   Depression with anxiety 09/11/2015   Carotid artery disease 09/09/2015   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) 08/22/2015   Benign non-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 03/21/2015   Chronic low back pain 03/21/2015   Depression, major, 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   PCP:  Lenon Layman ORN, MD Pharmacy:   [facility] [phone] [place],  - [address] AT [facility] [address] [place] [address] [place] [address] Phone: [phone] Fax: [phone]  [facility] [place], [place] - [address] [facility] [address] [place] [address] Phone: [phone] Fax: [phone]  [facility] [address] [place] [address] Phone: [phone] Fax: [phone]     Social Drivers of Health (SDOH) Social History: SDOH Screenings   Food Insecurity: No Food Insecurity (12/18/2023)  Housing:  Low Risk  (12/18/2023)  Transportation Needs: No Transportation Needs (12/18/2023)  Utilities: Not At Risk (12/18/2023)  Depression (PHQ2-9): Low Risk  (12/17/2023)  Financial Resource Strain: Low Risk  (12/16/2023)   Received from [facility]  Social Connections: Moderately Integrated (12/18/2023)  Tobacco Use: High Risk (12/18/2023)   SDOH Interventions:     Readmission Risk Interventions    12/19/2023    2:02 PM  Readmission Risk Prevention Plan  Transportation Screening Complete  PCP or Specialist Appt within 3-5 Days Complete  Social Work Consult for Recovery Care Planning/Counseling Complete  Palliative Care Screening Not Applicable  Medication Review Oceanographer) Referral to Pharmacy

## 2023-12-19 NOTE — Progress Notes (Addendum)
 Initial Nutrition Assessment  DOCUMENTATION CODES:   Underweight, Severe malnutrition in context of chronic illness  INTERVENTION:   -Liberalize diet to regular diet widest variety of meal selections -Ensure Plus High Protein po TID, each supplement provides 350 kcal and 20 grams of protein  -Magic cup TID with meals, each supplement provides 290 kcal and 9 grams of protein  -MVI with minerals daily -Findings discussed with team; agree with palliative care consult (pending) for goals of care discussions  NUTRITION DIAGNOSIS:   Severe Malnutrition related to chronic illness (COPD, CHF, newly diagnosed lung cancer) as evidenced by moderate fat depletion, severe fat depletion, moderate muscle depletion, severe muscle depletion.  GOAL:   Patient will meet greater than or equal to 90% of their needs  MONITOR:   PO intake, Supplement acceptance  REASON FOR ASSESSMENT:   Consult Assessment of nutrition requirement/status, Diet education  ASSESSMENT:   78 y.o. year old male with medical history of hypertension, hyperlipidemia, type 2 diabetes, COPD, CHF, chronic hypoxic respiratory failure on 4 L, recent diagnosis of localized non-small cell lung cancer presented with generalized weakness and shortness of breath.  Patient admitted with symptomatic anemia.  Reviewed I/O's: +209 ml x 24 hours  UOP: 800 ml x 24 hours  Per H&P, patient reports weakness over the past week, which has worsened over the past 2 days PTA.   Patient with a skin tear injury on the left arm, which he developed when he ht his arm on a door handle. This causes profuse bleeding with excessive blood loss. Per [facility] notes, patient with with type 2 skin tear (partial flap loss).   Patient was recently diagnosed with lung cancer with plans for radiation, however, this has not started yet.   Spoke with patient at bedside, who reports not feeling great today He complains of fatigue and respiratory distress. Per  patient, is is difficult for him to talk and he has a flat affect. RD adjusted interview technique to mainly close ended questions to help obtain history.   Patient was looking at the menu prior to visit. He reports that he does not know what he wants to eat and nothing sounds good. Patient explains that he does not have a good appetite at baseline and does not eat much. He denies any difficulty chewing or swallowing foods. Given patient's malnutrition and poor appetite, RD will liberalize diet to regular to offer the widest variety of food selections in attempt to increase oral intake. Although patient has a history of diabetes, his Hgb A1c is within defined range and takes farxiga  PTA; he is on no glucose medications currently and CBGS are WDL. Patient is agreeable to plan. Noted intake documented at 60% this morning at breakfast.   Per chart review, there is no documented weight loss over the past 6 month, however, patient with moderate edema and with history of CHF, which may be masking true weight loss.   Discussed importance of good meal and supplement intake to promote healing. Pt agreeable to supplements.   Palliative care consult pending for goals of care discussions. Patient expressed concern that he is nearing the end of his life.   Case discussed with RN and nurse tech; informed them of patient complaint of urinary catheter discomfort.   Findings discussed with RN, MD, and palliative care.   Medications reviewed and include buspar , celexa , farxiga , lasix , MVI with minerals daily, prednisone , and rocephin .  Lab Results  Component Value Date   HGBA1C 5.5 11/05/2023  PTA DM medications are 10 mg farxiga  daily.   Labs reviewed: CBGS: 165 (inpatient orders for glycemic control are none).    NUTRITION - FOCUSED PHYSICAL EXAM:  Flowsheet Row Most Recent Value  Orbital Region Moderate depletion  Upper Arm Region Severe depletion  Thoracic and Lumbar Region Severe depletion   Buccal Region Moderate depletion  Temple Region Moderate depletion  Clavicle Bone Region Severe depletion  Clavicle and Acromion Bone Region Severe depletion  Scapular Bone Region Severe depletion  Dorsal Hand Severe depletion  Patellar Region Severe depletion  Anterior Thigh Region Severe depletion  Posterior Calf Region Severe depletion  Edema (RD Assessment) Moderate  Hair Reviewed  Eyes Reviewed  Mouth Reviewed  Skin Reviewed  Nails Reviewed    Diet Order:   Diet Order             Diet regular Fluid consistency: Thin  Diet effective now                   EDUCATION NEEDS:   Education needs have been addressed  Skin:  Skin Assessment: Skin Integrity Issues: Skin Integrity Issues:: Other (Comment) Other: left arm skin tear  Last BM:  12/19/23 (type 6)  Height:   Ht Readings from Last 1 Encounters:  12/18/23 5' 8 (1.727 m)    Weight:   Wt Readings from Last 1 Encounters:  12/19/23 54.8 kg    Ideal Body Weight:  70 kg  BMI:  Body mass index is 18.37 kg/m.  Estimated Nutritional Needs:   Kcal:  2000-2200  Protein:  120-135 grams  Fluid:  2.0-2.2 L    Margery ORN, RD, LDN, CDCES Registered Dietitian III Certified Diabetes Care and Education Specialist If unable to reach this RD, please use RD Inpatient group chat on secure chat between hours of 8am-4 pm daily

## 2023-12-19 NOTE — Consult Note (Signed)
 WOC Nurse Consult Note: Reason for Consult: requested to assess a left upper extremity wound. Wound type: Skin tear type 2 (partial flap loss) Pressure Injury POA: NA Measurement: 3 cm x 2 cm x 0.1 cm Wound bed: 100% red Drainage (amount, consistency, odor) Scan amount, serosanguinous, no odor. Periwound: bruises, intact. Dressing procedure/placement/frequency: Cleanse with saline, pat dry. Apply Xeroform to the wound bed daily, top with foam dressing. Note the foam applied today 11/20 has an arrow (diagonal direction from top to bottom). When move the foam, follow the arrow direction to avoid accidentally remove the skin flap. The foam can stay up to 3 days if not saturated or soiled. Ok to lift and reapply a new Xeroform.  WOC team will not plan to follow further. Please reconsult if further assistance is needed. Thank-you,  Lela Holm MSN, RN, CNS.  (Phone [phone])

## 2023-12-19 NOTE — Progress Notes (Signed)
 PROGRESS NOTE    Todd Whitehead  FMW:[id number] DOB: 1945/02/16 DOA: 12/18/2023 PCP: Lenon Layman ORN, MD   Brief Narrative:    78 y.o. 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 He has skin tear from injury - possibly bleed from the area -. No hematemesis, or melena reported by the patient. WOC c/s - dressing applied. S/p 1 PRBC transfusion and stable H & H   Left upper extremity laceration: bleeding has subsided. Dressing per [facility] nurse applied. No further bleeding   CAP: Patient with worsening cough and sputum production with x-ray showing concern for pneumonia.  This is redemonstrated by CT.   Continue Rocephin  and azithromycin .  Monitor leukocyte count, fever curve and blood cultures.   COPD exacerbation: given dyspnea and worsening cough with sputum production. 5 days of prednisone  taper. Likely exacerbated by CAP   Chronic diastolic CHF: s/p Lasix  40 mg IV once.   Hyperlipidemia: Continue home statin   Tobacco use disorder: Discussed complete cessation by  admitting TRH. Patient would like to think about this.  Declines NRT.   Non-small cell lung cancer: Recently diagnosed.  Patient to get radiation therapy.   Overall poor prognosis, Palliative care c/s    MDD/GAD, Insomnia: continue home meds, resume Trazodone , Xanax   Severe Malnutrition related to chronic illness (COPD, CHF, newly diagnosed lung cancer) as evidenced by moderate fat depletion, severe fat depletion, moderate muscle depletion, severe muscle depletion. Nutritionist c/s  GOC - PC c/s   DVT prophylaxis: SCDs SCDs Start: 12/18/23 1605     Code Status: DNR Family Communication: Updated son over the phone Disposition Plan: possible DC in 1-2 days depending on clinical condition    Antimicrobials:  Rocephin  Azithromycin     Subjective:  Cough, SOB +  Objective: Vitals:   12/19/23 0801 12/19/23 1130 12/19/23 1603 12/19/23 1934  BP:  124/66 (!) 140/87   Pulse:  93 93   Resp:  20    Temp:  98.1 F (36.7 C) 99.7 F (37.6 C)   TempSrc:  Oral    SpO2: 91% 92% 92% 92%  Weight:      Height:        Intake/Output Summary (Last 24 hours) at 12/19/2023 1940 Last data filed at 12/19/2023 1900 Gross per 24 hour  Intake 1618 ml  Output 3450 ml  Net -1832 ml   Filed Weights   12/18/23 1343 12/19/23 0428  Weight: 54.8 kg 54.8 kg    Examination:  General exam: Cachectic and chronically ill looking Respiratory system: decreased breath sounds in bases, Rhonchi + at RLL Cardiovascular system: S1 &  S2 heard, RRR. No murmurs Gastrointestinal system: Abdomen is soft, benign Central nervous system: Alert and oriented. No focal neurological deficits. Extremities:  dressing + on Left UE Psychiatry: Judgement and insight appear normal. Mood & affect appropriate.     Data Reviewed: I have personally reviewed following labs and imaging studies  CBC: Recent Labs  Lab 12/18/23 1354 12/18/23 1959 12/19/23 0303  WBC 13.0*  --  19.7*  NEUTROABS 10.5*  --   --   HGB  6.4* 6.6* 8.6*  HCT 20.7* 20.1* 26.6*  MCV 89.6  --  84.7  PLT 259  --  312   Basic Metabolic Panel: Recent Labs  Lab 12/18/23 1354 12/19/23 0303  NA 138 140  K 3.8 4.1  CL 101 99  CO2 30 31  GLUCOSE 164* 156*  BUN 17 16  CREATININE 1.03 1.07  CALCIUM  9.1 9.1  MG 3.2*  --    GFR: Estimated Creatinine Clearance: 44.1 mL/min (by C-G formula based on SCr of 1.07 mg/dL). Liver Function Tests: Recent Labs  Lab 12/18/23 1354  AST 35  ALT 28  ALKPHOS 119  BILITOT 0.4  PROT 6.5  ALBUMIN  3.4*    BNP (last 3 results) Recent Labs    12/18/23 1354  PROBNP 5,709.0*   HbA1C: No results for input(s): HGBA1C in the last 72 hours. CBG: Recent Labs  Lab 12/19/23 0739 12/19/23 1138 12/19/23 1600  GLUCAP 165* 233* 130*    Anemia Panel: Recent Labs    12/18/23 1548  FERRITIN 220  TIBC 269  IRON <10*   Sepsis Labs: Recent Labs  Lab 12/18/23 1354 12/18/23 1548 12/19/23 0758 12/19/23 1118  LATICACIDVEN 2.4* 2.3* 1.0 1.3    Recent Results (from the past 240 hours)  Resp panel by RT-PCR (RSV, Flu A&B, Covid) Anterior Nasal Swab     Status: None   Collection Time: 12/18/23  1:54 PM   Specimen: Anterior Nasal Swab  Result Value Ref Range Status   SARS Coronavirus 2 by RT PCR NEGATIVE NEGATIVE Final    Comment: (NOTE) SARS-CoV-2 target nucleic acids are NOT DETECTED.  The SARS-CoV-2 RNA is generally detectable in upper respiratory specimens during the acute phase of infection. The lowest concentration of SARS-CoV-2 viral copies this assay can detect is 138 copies/mL. A negative result does not preclude SARS-Cov-2 infection and should not be used as the sole basis for treatment or other patient management decisions. A negative result may occur with  improper specimen collection/handling, submission of specimen other than nasopharyngeal swab, presence of viral mutation(s) within the areas targeted by this assay, and inadequate number of viral copies(<138  copies/mL). A negative result must be combined with clinical observations, patient history, and epidemiological information. The expected result is Negative.  Fact Sheet for Patients:  bloggercourse.com  Fact Sheet for Healthcare Providers:  seriousbroker.it  This test is no t yet approved or cleared by the [place]  FDA and  has been authorized for detection and/or diagnosis of SARS-CoV-2 by FDA under an Emergency Use Authorization (EUA). This EUA will remain  in effect (meaning this test can be used) for the duration of the COVID-19 declaration under Section 564(b)(1) of the Act, 21 U.S.C.section 360bbb-3(b)(1), unless the authorization is terminated  or revoked sooner.       Influenza A by PCR NEGATIVE NEGATIVE Final   Influenza B by PCR NEGATIVE NEGATIVE Final    Comment: (NOTE) The Xpert Xpress SARS-CoV-2/FLU/RSV plus assay is intended as an aid in the diagnosis of  influenza from Nasopharyngeal swab specimens and should not be used as a sole basis for treatment. Nasal washings and aspirates are unacceptable for Xpert Xpress SARS-CoV-2/FLU/RSV testing.  Fact Sheet for Patients: bloggercourse.com  Fact Sheet for Healthcare Providers: seriousbroker.it  This test is not yet approved or cleared by the [place]  FDA and has been authorized for detection and/or diagnosis of SARS-CoV-2 by FDA under an Emergency Use Authorization (EUA). This EUA will remain in effect (meaning this test can be used) for the duration of the COVID-19 declaration under Section 564(b)(1) of the Act, 21 U.S.C. section 360bbb-3(b)(1), unless the authorization is terminated or revoked.     Resp Syncytial Virus by PCR NEGATIVE NEGATIVE Final    Comment: (NOTE) Fact Sheet for Patients: bloggercourse.com  Fact Sheet for Healthcare  Providers: seriousbroker.it  This test is not yet approved or cleared by the [place]  FDA and has been authorized for detection and/or diagnosis of SARS-CoV-2 by FDA under an Emergency Use Authorization (EUA). This EUA will remain in effect (meaning this test can be used) for the duration of the COVID-19 declaration under Section 564(b)(1) of the Act, 21 U.S.C. section 360bbb-3(b)(1), unless the authorization is terminated or revoked.  Performed at [facility], [address], [place], [place] [address]   Blood Culture (routine x 2)     Status: None (Preliminary result)   Collection Time: 12/18/23  3:24 PM   Specimen: Right Antecubital; Blood  Result Value Ref Range Status   Specimen Description RIGHT ANTECUBITAL  Final   Special Requests   Final    BOTTLES DRAWN AEROBIC AND ANAEROBIC Blood Culture results may not be optimal due to an inadequate volume of blood received in culture bottles   Culture   Final    NO GROWTH < 12 HOURS Performed at [facility], [address], [place], [place] [address]    Report Status PENDING  Incomplete  Blood Culture (routine x 2)     Status: None (Preliminary result)   Collection Time: 12/18/23  3:47 PM   Specimen: BLOOD RIGHT HAND  Result Value Ref Range Status   Specimen Description BLOOD RIGHT HAND  Final   Special Requests   Final    BOTTLES DRAWN AEROBIC AND ANAEROBIC Blood Culture results may not be optimal due to an inadequate volume of blood received in culture bottles   Culture   Final    NO GROWTH < 12 HOURS Performed at [facility], [address], [place], [place] [address]    Report Status PENDING  Incomplete         Radiology Studies: CT Angio Chest PE W and/or Wo Contrast Result Date: 12/18/2023 CLINICAL DATA:  Shortness of breath.  Diagnosis of lung cancer. EXAM: CT ANGIOGRAPHY CHEST WITH CONTRAST TECHNIQUE: Multidetector CT imaging of the chest was performed  using the standard protocol during bolus administration of intravenous contrast. Multiplanar CT image reconstructions and MIPs were obtained to evaluate the vascular anatomy. RADIATION DOSE REDUCTION: This exam was performed according to the departmental dose-optimization program which includes automated exposure control, adjustment of the mA and/or kV according to patient size and/or use of iterative reconstruction technique. CONTRAST:  75mL OMNIPAQUE  IOHEXOL  350 MG/ML SOLN COMPARISON:  CT chest 11/26/2023. FINDINGS: Cardiovascular: Satisfactory opacification of the pulmonary arteries to the segmental level. No evidence of pulmonary embolism. Normal heart size. No pericardial effusion. The aorta is ectatic. Patient is status post cardiac surgery. There are atherosclerotic calcifications of the aorta. Mediastinum/Nodes: Enlarged right  hilar lymph node measures 15 mm. Enlarged left hilar lymph node measures 11 mm. Visualized esophagus and thyroid  gland are within normal limits. Lungs/Pleura: Moderate emphysema is again seen. Pleural-parenchymal scarring in the lung apices appears stable. There is right lower lobe airspace consolidation which is new from prior. There is a small right pleural effusion similar to prior. There are additional scattered tree-in-bud opacities throughout the bilateral upper lobes and minimally in the left lower lobe. Spiculated nodular density in the left lower lobe measures 9 mm in appears unchanged from prior. Upper Abdomen: No acute abnormality. Musculoskeletal: Sternotomy wires are present. Review of the MIP images confirms the above findings. IMPRESSION: 1. No evidence for pulmonary embolism. 2. New right lower lobe airspace consolidation worrisome for pneumonia. 3. New bilateral hilar lymphadenopathy, likely reactive. 4. Stable small right pleural effusion. 5. Stable spiculated nodular density in the left lower lobe measuring 9 mm, indeterminate. Aortic Atherosclerosis (ICD10-I70.0)  and Emphysema (ICD10-J43.9). Electronically Signed   By: Greig Pique M.D.   On: 12/18/2023 16:45   DG Chest Portable 1 View Result Date: 12/18/2023 CLINICAL DATA:  Shortness of breath.  History of emphysema. EXAM: PORTABLE CHEST 1 VIEW COMPARISON:  Numerous prior imaging studies. The most recent chest x-rays 11/29/2023 FINDINGS: Advanced emphysematous changes with marked hyperinflation bullous changes and pulmonary scarring. Superimposed patchy airspace opacity in the right mid and lower lungs is worrisome for superimposed pneumonia. Suspect small right pleural effusion also. No pneumothorax. No obvious pulmonary lesions. The bony thorax is intact. IMPRESSION: 1. Advanced emphysematous changes and pulmonary scarring. 2. Superimposed patchy airspace opacity in the right mid and lower lungs worrisome for superimposed pneumonia. Electronically Signed   By: MYRTIS Stammer M.D.   On: 12/18/2023 14:27        Scheduled Meds:  atorvastatin   40 mg Oral Daily   azithromycin   500 mg Oral Daily   budesonide  (PULMICORT ) nebulizer solution  0.5 mg Nebulization BID   busPIRone   30 mg Oral BID   citalopram   20 mg Oral Daily   dapagliflozin  propanediol  10 mg Oral QAC breakfast   feeding supplement  237 mL Oral TID BM   latanoprost   1 drop Both Eyes QHS   multivitamin with minerals  1 tablet Oral Daily   predniSONE   40 mg Oral Q breakfast   QUEtiapine   50 mg Oral QHS   sodium chloride  flush  3 mL Intravenous Q12H   tamsulosin   0.4 mg Oral QPC breakfast   traZODone   50 mg Oral QHS   Continuous Infusions:  cefTRIAXone  (ROCEPHIN )  IV 2 g (12/19/23 0955)     LOS: 1 day    Time spent: 35 mins    Reilyn Nelson Maree, MD [facility] Pager [phone]  If 7PM-7AM, please contact night-coverage [email or web address]  12/19/2023, 7:40 PM

## 2023-12-19 NOTE — Plan of Care (Signed)

## 2023-12-20 ENCOUNTER — Telehealth (HOSPITAL_COMMUNITY): Payer: Self-pay | Admitting: Pharmacy Technician

## 2023-12-20 ENCOUNTER — Other Ambulatory Visit (HOSPITAL_COMMUNITY): Payer: Self-pay

## 2023-12-20 DIAGNOSIS — C3490 Malignant neoplasm of unspecified part of unspecified bronchus or lung: Secondary | ICD-10-CM

## 2023-12-20 DIAGNOSIS — D649 Anemia, unspecified: Secondary | ICD-10-CM | POA: Diagnosis not present

## 2023-12-20 DIAGNOSIS — J441 Chronic obstructive pulmonary disease with (acute) exacerbation: Secondary | ICD-10-CM | POA: Diagnosis not present

## 2023-12-20 DIAGNOSIS — E43 Unspecified severe protein-calorie malnutrition: Secondary | ICD-10-CM

## 2023-12-20 DIAGNOSIS — Z515 Encounter for palliative care: Secondary | ICD-10-CM

## 2023-12-20 DIAGNOSIS — I2510 Atherosclerotic heart disease of native coronary artery without angina pectoris: Secondary | ICD-10-CM | POA: Diagnosis not present

## 2023-12-20 DIAGNOSIS — J9611 Chronic respiratory failure with hypoxia: Secondary | ICD-10-CM | POA: Diagnosis not present

## 2023-12-20 DIAGNOSIS — Z7189 Other specified counseling: Secondary | ICD-10-CM

## 2023-12-20 DIAGNOSIS — J189 Pneumonia, unspecified organism: Secondary | ICD-10-CM | POA: Diagnosis not present

## 2023-12-20 LAB — CULTURE, FUNGUS WITHOUT SMEAR

## 2023-12-20 LAB — GLUCOSE, CAPILLARY: Glucose-Capillary: 131 mg/dL — ABNORMAL HIGH (ref 70–99)

## 2023-12-20 MED ORDER — AZITHROMYCIN 500 MG PO TABS
500.0000 mg | ORAL_TABLET | Freq: Every day | ORAL | Status: DC
  Administered 2023-12-21 – 2023-12-22 (×2): 500 mg via ORAL
  Filled 2023-12-20 (×2): qty 1

## 2023-12-20 MED ORDER — PREDNISONE 20 MG PO TABS
30.0000 mg | ORAL_TABLET | Freq: Every day | ORAL | Status: DC
  Administered 2023-12-21: 30 mg via ORAL
  Filled 2023-12-20: qty 1

## 2023-12-20 NOTE — Plan of Care (Signed)

## 2023-12-20 NOTE — Progress Notes (Signed)
 Report called to Allean Cone, RN.

## 2023-12-20 NOTE — Progress Notes (Incomplete)
 Report called to

## 2023-12-20 NOTE — Progress Notes (Signed)
 PROGRESS NOTE    Todd Whitehead  FMW:[id number] DOB: March 22, 1945 DOA: 12/18/2023 PCP: Lenon Layman ORN, MD   Brief Narrative:    78 y.o. year old male with medical history of hypertension, hyperlipidemia, type 2 diabetes, COPD, CHF, chronic hypoxic respiratory failure on 4 L, recent diagnosis of localized non-small cell lung cancer presented to the ED with generalized weakness and shortness of breath   11/20: restarted xanax , trazodone , WOC, Palliative care & Nutritionist c/s 11/21: transfer to med-surg, DC tele. 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 He has skin tear from injury - possibly bleed from the area -. No hematemesis, or melena reported by the patient. WOC c/s - dressing in place S/p 1 PRBC transfusion and stable H & H   Left upper extremity laceration: bleeding has subsided. Dressing per [facility] nurse applied. No further bleeding   CAP: Patient with worsening cough and sputum production with x-ray showing concern for pneumonia.  This is redemonstrated by CT.   Continue Rocephin  and azithromycin .  Monitor leukocyte count, fever curve and blood cultures.   COPD exacerbation: given dyspnea and worsening cough with sputum production. Complete 5 days of prednisone  (10 mg) taper. Likely exacerbated by CAP   Chronic diastolic CHF: s/p Lasix  40 mg IV once. Euvolemic now.   Hyperlipidemia:  Continue home statin   Tobacco use disorder: Discussed complete cessation by admitting TRH. Patient would like to think about this.  Declines NRT.   Non-small cell lung cancer: Recently diagnosed.  Patient to get radiation therapy. Overall poor prognosis, Palliative care c/s - son inclined on keeping him comfortable and would like to d/w sister, father before decision   MDD/GAD, Insomnia: continue home meds, resume Trazodone , Xanax   Severe Malnutrition related to chronic illness (COPD, CHF, newly diagnosed lung cancer) as evidenced by moderate fat depletion, severe fat depletion, moderate muscle depletion, severe muscle depletion. Nutritionist c/s  GOC - PC c/s - family meeting tomorrow to decide next steps   DVT prophylaxis: SCDs SCDs Start: 12/18/23 1605     Code Status: DNR Family Communication: Updated son over the phone Disposition Plan: possible DC in 1-2 days depending on clinical condition    Antimicrobials:  Rocephin  Azithromycin     Subjective:  Cough, SOB +, sitting in the chair. Would like to be home  Objective: Vitals:   12/20/23 1400 12/20/23 1505 12/20/23 1625 12/20/23 1644  BP:  (!) 142/56 (!) 163/92   Pulse:  81 87   Resp:   18   Temp:  98.1 F (36.7 C) 98.2 F (36.8 C)   TempSrc:   Oral   SpO2: 96% 91% 100% 99%  Weight:      Height:        Intake/Output Summary (Last 24 hours) at 12/20/2023 1740 Last data filed at 12/20/2023 1500 Gross per 24 hour  Intake 783 ml  Output 2850 ml  Net -2067 ml   Filed Weights   12/18/23 1343 12/19/23 0428 12/20/23 0434  Weight: 54.8 kg 54.8 kg 47.8 kg    Examination:  General exam: Cachectic and chronically ill looking Respiratory system: decreased breath sounds in bases, Rhonchi + at RLL Cardiovascular system: S1 & S2 heard, RRR. No murmurs Gastrointestinal system: Abdomen is soft, benign Central nervous system: Alert and oriented. No focal neurological deficits. Extremities:  dressing + on Left  UE Psychiatry: Judgement and insight appear normal. Mood & affect appropriate.     Data Reviewed: I have personally reviewed following labs and imaging studies  CBC: Recent Labs  Lab 12/18/23 1354 12/18/23 1959 12/19/23 0303  WBC 13.0*  --  19.7*  NEUTROABS 10.5*  --   --   HGB 6.4* 6.6* 8.6*  HCT 20.7* 20.1* 26.6*  MCV 89.6  --  84.7  PLT 259  --  312   Basic Metabolic Panel: Recent Labs  Lab 12/18/23 1354 12/19/23 0303  NA 138 140  K 3.8 4.1  CL 101 99  CO2 30 31  GLUCOSE 164* 156*  BUN 17 16  CREATININE 1.03 1.07  CALCIUM  9.1 9.1  MG 3.2*  --    GFR: Estimated Creatinine Clearance: 38.5 mL/min (by C-G formula based on SCr of 1.07 mg/dL). Liver Function Tests: Recent Labs  Lab 12/18/23 1354  AST 35  ALT 28  ALKPHOS 119  BILITOT 0.4  PROT 6.5  ALBUMIN  3.4*    BNP (last 3 results) Recent Labs    12/18/23 1354  PROBNP 5,709.0*   HbA1C: No results for input(s): HGBA1C in the last 72 hours. CBG: Recent Labs  Lab 12/19/23 0739 12/19/23 1138 12/19/23 1600 12/20/23 0728  GLUCAP 165* 233* 130* 131*    Anemia Panel: Recent Labs    12/18/23 1548  FERRITIN 220  TIBC 269  IRON <10*   Sepsis Labs: Recent Labs  Lab 12/18/23 1354 12/18/23 1548 12/19/23 0758 12/19/23 1118  LATICACIDVEN 2.4* 2.3* 1.0 1.3    Recent Results (from the past 240 hours)  Resp panel by RT-PCR (RSV, Flu A&B, Covid) Anterior Nasal Swab     Status: None   Collection Time: 12/18/23  1:54 PM   Specimen: Anterior Nasal Swab  Result Value Ref Range Status   SARS Coronavirus 2 by RT PCR NEGATIVE NEGATIVE Final    Comment: (NOTE) SARS-CoV-2 target nucleic acids are NOT DETECTED.  The SARS-CoV-2 RNA is generally detectable in upper respiratory specimens during the acute phase of infection. The lowest concentration of SARS-CoV-2 viral copies this assay can detect is 138 copies/mL. A negative result does not preclude SARS-Cov-2 infection and should not be used as the  sole basis for treatment or other patient management decisions. A negative result may occur with  improper specimen collection/handling, submission of specimen other than nasopharyngeal swab, presence of viral mutation(s) within the areas targeted by this assay, and inadequate number of viral copies(<138 copies/mL). A negative result must be combined with clinical observations, patient history, and epidemiological information. The expected result is Negative.  Fact Sheet for Patients:  bloggercourse.com  Fact Sheet for Healthcare Providers:  seriousbroker.it  This test is no t yet approved or cleared by the [place]  FDA and  has been authorized for detection and/or diagnosis of SARS-CoV-2 by FDA under an Emergency Use Authorization (EUA). This EUA will remain  in effect (meaning this test can be used) for the duration of the COVID-19 declaration under Section 564(b)(1) of the Act, 21  U.S.C.section 360bbb-3(b)(1), unless the authorization is terminated  or revoked sooner.       Influenza A by PCR NEGATIVE NEGATIVE Final   Influenza B by PCR NEGATIVE NEGATIVE Final    Comment: (NOTE) The Xpert Xpress SARS-CoV-2/FLU/RSV plus assay is intended as an aid in the diagnosis of influenza from Nasopharyngeal swab specimens and should not be used as a sole basis for treatment. Nasal washings and aspirates are unacceptable for Xpert Xpress SARS-CoV-2/FLU/RSV testing.  Fact Sheet for Patients: bloggercourse.com  Fact Sheet for Healthcare Providers: seriousbroker.it  This test is not yet approved or cleared by the [place]  FDA and has been authorized for detection and/or diagnosis of SARS-CoV-2 by FDA under an Emergency Use Authorization (EUA). This EUA will remain in effect (meaning this test can be used) for the duration of the COVID-19 declaration under Section 564(b)(1) of the  Act, 21 U.S.C. section 360bbb-3(b)(1), unless the authorization is terminated or revoked.     Resp Syncytial Virus by PCR NEGATIVE NEGATIVE Final    Comment: (NOTE) Fact Sheet for Patients: bloggercourse.com  Fact Sheet for Healthcare Providers: seriousbroker.it  This test is not yet approved or cleared by the [place]  FDA and has been authorized for detection and/or diagnosis of SARS-CoV-2 by FDA under an Emergency Use Authorization (EUA). This EUA will remain in effect (meaning this test can be used) for the duration of the COVID-19 declaration under Section 564(b)(1) of the Act, 21 U.S.C. section 360bbb-3(b)(1), unless the authorization is terminated or revoked.  Performed at [facility], [address], [place], [place] [address]   Blood Culture (routine x 2)     Status: None (Preliminary result)   Collection Time: 12/18/23  3:24 PM   Specimen: Right Antecubital; Blood  Result Value Ref Range Status   Specimen Description RIGHT ANTECUBITAL  Final   Special Requests   Final    BOTTLES DRAWN AEROBIC AND ANAEROBIC Blood Culture results may not be optimal due to an inadequate volume of blood received in culture bottles   Culture   Final    NO GROWTH 2 DAYS Performed at [facility], [address], [place], [place] [address]    Report Status PENDING  Incomplete  Blood Culture (routine x 2)     Status: None (Preliminary result)   Collection Time: 12/18/23  3:47 PM   Specimen: BLOOD RIGHT HAND  Result Value Ref Range Status   Specimen Description BLOOD RIGHT HAND  Final   Special Requests   Final    BOTTLES DRAWN AEROBIC AND ANAEROBIC Blood Culture results may not be optimal due to an inadequate volume of blood received in culture bottles   Culture   Final    NO GROWTH 2 DAYS Performed at [facility], [address], [place], [place] [address]    Report Status PENDING  Incomplete          Radiology Studies: No results found.       Scheduled Meds:  atorvastatin   40 mg Oral Daily   budesonide  (PULMICORT ) nebulizer solution  0.5 mg Nebulization BID   busPIRone   30 mg Oral BID   citalopram   20 mg Oral Daily   dapagliflozin  propanediol  10 mg Oral QAC breakfast   feeding supplement  237 mL Oral TID BM   latanoprost   1 drop Both Eyes QHS   multivitamin with minerals  1 tablet Oral Daily   predniSONE   40 mg Oral Q breakfast   QUEtiapine   50 mg  Oral QHS   tamsulosin   0.4 mg Oral QPC breakfast   traZODone   50 mg Oral QHS   Continuous Infusions:  cefTRIAXone  (ROCEPHIN )  IV 2 g (12/20/23 0943)     LOS: 2 days    Time spent: 35 mins    Tayte Childers Maree, MD [facility] Pager [phone]  If 7PM-7AM, please contact night-coverage [email or web address]  12/20/2023, 5:40 PM

## 2023-12-20 NOTE — Evaluation (Signed)
 Clinical/Bedside Swallow Evaluation Patient Details  Name: Todd Whitehead MRN: [id number] Date of Birth: March 17, 1945  Today's Date: 12/20/2023 Time: SLP Start Time (ACUTE ONLY): 1130 SLP Stop Time (ACUTE ONLY): 1145 SLP Time Calculation (min) (ACUTE ONLY): 15 min  Past Medical History:  Past Medical History:  Diagnosis Date   Anxiety    a.) on BZO (alprazolam ) PRN   Aortic atherosclerosis    Arthritis    ASD (atrial septal defect)    Bilateral carotid artery disease    BPH (benign prostatic hyperplasia)    CAD (coronary artery disease)    Carotid bruit    Chronic gastritis    Chronic lower back pain    Chronic respiratory failure (HCC)    a.) requires supplemental oxygen (2-3 L/[place]); non-compliant   Colon polyp    COPD (chronic obstructive pulmonary disease) (HCC)    Depression    Glaucoma    Heart murmur    HFrEF (heart failure with reduced ejection fraction) (HCC)    HLD (hyperlipidemia)    Hyperplastic colonic polyp    Hypertension    Long-term use of aspirin  therapy    Low serum vitamin B12 11/18/2020   Melanoma (LEFT upper arm)    Mesenteric vascular insufficiency    MGUS (monoclonal gammopathy of unknown significance)    Myocardial infarction (HCC) 07/2015   a.) type unclear; b.) LHC 08/19/2015: 80% mLM, 85% mLCx, 100% mRCA, 50% LM --> CVTS   Nodule of lower lobe of left lung    Nonspecific chest pain    On long term clopidogrel  therapy    Peripheral vascular disease    a.) s/p celiac artery stent placement   Prediabetes    RBBB (right bundle branch block)    Right inguinal hernia    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   a.) LIMA-LAD, SVG-OM1, SVG-dRCA with endarterectomy   Supplemental oxygen dependent (2-3 L/[place])    Tobacco use    Tubular adenoma    Past Surgical History:  Past Surgical History:  Procedure Laterality Date   BACK SURGERY  01/19/1998   CARDIAC CATHETERIZATION N/A 08/19/2015   Procedure: Left Heart Cath and Coronary Angiography;  Surgeon: Vinie DELENA Jude, MD;  Location: [facility] INVASIVE CV LAB;  Service: Cardiovascular;  Laterality: N/A;   CHOLECYSTECTOMY N/A 08/19/2016   Procedure: LAPAROSCOPIC CHOLECYSTECTOMY WITH INTRAOPERATIVE CHOLANGIOGRAM;  Surgeon: Aron Shoulders, MD;  Location: [facility] OR;  Service: General;  Laterality: N/A;   COLON SURGERY  age 78 days old    bowel was twisted up   COLONOSCOPY WITH PROPOFOL  N/A 03/19/2016   Procedure: COLONOSCOPY WITH PROPOFOL ;  Surgeon: Gladis RAYMOND Mariner, MD;  Location: [facility] ENDOSCOPY;  Service: Endoscopy;  Laterality: N/A;   CORONARY ARTERY BYPASS GRAFT N/A 09/09/2015   ERCP N/A 08/18/2016   Procedure: ENDOSCOPIC RETROGRADE CHOLANGIOPANCREATOGRAPHY (ERCP);  Surgeon: Saintclair Jasper, MD;  Location: [facility] ENDOSCOPY;  Service: Gastroenterology;  Laterality: N/A;   ESOPHAGOGASTRODUODENOSCOPY (EGD) WITH PROPOFOL  N/A 09/18/2018   Procedure: ESOPHAGOGASTRODUODENOSCOPY (EGD) WITH PROPOFOL ;  Surgeon: Tye Millet, DO;  Location: [facility] ENDOSCOPY;  Service: General;  Laterality: N/A;   EYE SURGERY Left 03/26/2011   FIBEROPTIC BRONCHOSCOPY Bilateral 03/18/2023   Procedure: BEDSIDE BRONCHOSCOPY FIBEROPTIC;  Surgeon: Parris Manna, MD;  Location: [facility] ORS;  Service: Thoracic;  Laterality: Bilateral;   FLEXIBLE BRONCHOSCOPY Bilateral 03/18/2023   Procedure: FLEXIBLE BRONCHOSCOPY;  Surgeon: Parris Manna, MD;  Location: [facility] ORS;  Service: Thoracic;  Laterality: Bilateral;   LOWER EXTREMITY ANGIOGRAPHY Left 10/03/2017   Procedure: LOWER EXTREMITY ANGIOGRAPHY;  Surgeon: Marea Selinda RAMAN, MD;  Location: [facility];  Service: Cardiovascular;  Laterality: Left;   LOWER EXTREMITY ANGIOGRAPHY Right 10/31/2017   Procedure: LOWER EXTREMITY ANGIOGRAPHY;  Surgeon: Marea Selinda RAMAN, MD;  Location: [facility];  Service: Cardiovascular;  Laterality: Right;   LUMBAR LAMINECTOMY/DECOMPRESSION MICRODISCECTOMY Right 10/02/2012   Procedure: Right Lumbar Five-Sacral One Lumbar laminotomy/Microdiskectomy;  Surgeon: Lamar LELON Peaches, MD;  Location: [facility] NEURO ORS;  Service: Neurosurgery;  Laterality: Right;  Right Lumbar Five-Sacral One Lumbar laminotomy/Microdiskectomy   VIDEO BRONCHOSCOPY WITH ENDOBRONCHIAL NAVIGATION N/A 11/29/2023   Procedure: VIDEO BRONCHOSCOPY WITH ENDOBRONCHIAL NAVIGATION;  Surgeon: Parris Manna, MD;  Location: [facility] ORS;  Service: Thoracic;  Laterality: N/A;   VIDEO BRONCHOSCOPY WITH ENDOBRONCHIAL ULTRASOUND N/A 11/29/2023   Procedure: BRONCHOSCOPY, WITH EBUS;  Surgeon: Parris Manna, MD;  Location: [facility] ORS;  Service: Thoracic;  Laterality: N/A;   VISCERAL ANGIOGRAPHY N/A 11/08/2020   Procedure: VISCERAL ANGIOGRAPHY;  Surgeon: Jama Cordella MATSU, MD;  Location: [facility];  Service: Cardiovascular;  Laterality: N/A;   VISCERAL ANGIOGRAPHY N/A 11/25/2020   Procedure: VISCERAL ANGIOGRAPHY;  Surgeon: Jama Cordella MATSU, MD;  Location: [facility];  Service: Cardiovascular;  Laterality: N/A;   HPI:  Todd Whitehead is a 78 y.o. year old male with medical history of hypertension, hyperlipidemia, type 2 diabetes, COPD, CHF, chronic hypoxic respiratory failure on 4 L, recent diagnosis of localized non-small cell lung cancer presented with generalized weakness and shortness of breath. Chest x-ray on 02/17/2023 revealed 1. Advanced emphysematous changes and pulmonary scarring. 2. Superimposed patchy airspace opacity in the right mid and lower lungs worrisome for superimposed pneumonia.    Assessment / Plan / Recommendation  Clinical Impression  Pt presents with fluctuating s/s of potential aspiration. He is frail appearing with increased work of breathing observed when answering basic questions.   He has a congested cough at rest and when consuming trials of thin liquids and nectar thick liquids. His coughing is reduced when consuming small single cup sips of thin liquids vs sips via straw and when consuming nectar thick liquids via cup.  At bedside it is difficult to differentiate  potential etiology of cough as pt has multiple medical issues that increase his risk of aspiration. Presently pt refused nectar thick liquids.   When consuming graham crackers, he demonstrated adequate oropharyngeal abilities.   Palliative Care has been consulted to aid in identifying GOC. ST to follow for diet tolerance and the possibility of an instrumental swallow study should pt's coughing continue with PO intake.    SLP Visit Diagnosis: Dysphagia, oropharyngeal phase (R13.12)    Aspiration Risk  Moderate aspiration risk;Mild aspiration risk    Diet Recommendation Regular;Thin liquid    Liquid Administration via: Cup Medication Administration: Whole meds with liquid Supervision: Patient able to self feed Compensations: Minimize environmental distractions;Slow rate;Small sips/bites Postural Changes: Seated upright at 90 degrees;Remain upright for at least 30 minutes after po intake    Other  Recommendations Oral Care Recommendations: Oral care BID        Functional Status Assessment Patient has had a recent decline in their functional status and/or demonstrates limited ability to make significant improvements in function in a reasonable and predictable amount of time  Frequency and Duration min 2x/week  2 weeks       Prognosis Prognosis for improved oropharyngeal function: Fair Barriers to Reach Goals: Severity of deficits      Swallow Study   General Date of Onset:  12/18/23 HPI: Braedon Sjogren is a 78 y.o. year old male with medical history of hypertension, hyperlipidemia, type 2 diabetes, COPD, CHF, chronic hypoxic respiratory failure on 4 L, recent diagnosis of localized non-small cell lung cancer presented with generalized weakness and shortness of breath. Chest x-ray on 02/17/2023 revealed 1. Advanced emphysematous changes and pulmonary scarring. 2. Superimposed patchy airspace opacity in the right mid and lower lungs worrisome for superimposed pneumonia. Type of Study:  Bedside Swallow Evaluation Previous Swallow Assessment: none in chart Diet Prior to this Study: Regular;Thin liquids (Level 0) Temperature Spikes Noted: No Respiratory Status: Nasal cannula History of Recent Intubation: No Behavior/Cognition: Alert Oral Cavity Assessment: Within Functional Limits Oral Care Completed by SLP: No Oral Cavity - Dentition: Poor condition Vision: Functional for self-feeding Self-Feeding Abilities: Able to feed self Patient Positioning: Upright in chair Baseline Vocal Quality: Low vocal intensity;Breathy Volitional Cough: Congested Volitional Swallow: Able to elicit    Oral/Motor/Sensory Function Overall Oral Motor/Sensory Function: Within functional limits   Ice Chips Ice chips: Not tested   Thin Liquid Thin Liquid: Impaired Presentation: Self Fed;Cup;Straw Pharyngeal  Phase Impairments: Suspected delayed Swallow;Decreased hyoid-laryngeal movement;Cough - Delayed    Nectar Thick Nectar Thick Liquid: Impaired Presentation: Cup;Self Fed Pharyngeal Phase Impairments: Decreased hyoid-laryngeal movement;Cough - Delayed   Honey Thick Honey Thick Liquid: Not tested   Puree Puree: Within functional limits   Solid     Solid: Within functional limits Presentation: Self Fed     Yachet Mattson B. Rubbie, M.S., CCC-SLP, Tree Surgeon Certified Brain Injury Specialist [facility]  [facility] Rehabilitation Services Office [phone] Ascom [phone] Fax [phone]

## 2023-12-20 NOTE — Evaluation (Signed)
 Physical Therapy Evaluation Patient Details Name: Arish Redner MRN: [id number] DOB: 1945/11/28 Today's Date: 12/20/2023  History of Present Illness  Fischer Halley is a 78 y.o. year old male with medical history of hypertension, hyperlipidemia, type 2 diabetes, COPD, CHF, chronic hypoxic respiratory failure on 4 L, recent diagnosis of localized non-small cell lung cancer presented with generalized weakness and shortness of breath.  Clinical Impression  Pt is pleasant 78 y.o. male admitted for symptomatic anemia. Prior to hospitalization, pt reports IND with amb and ADLs without use of AD. Pt now resides with his daughter in a 2 level home with 2 STE. Pt is able to live on the main floor. Pt is mod I with bed mobility; he used the bed features and supine>sit was effortful. Pt able to tolerate side stepping at EOB with 1 person HHA. Min A to maintain balance as pt demonstrates posterior bias. Pt on 5L of O2 upon entry to room and was desatting to low 80s while sitting at EOB. Pt unable to recover SpO2 despite PLBing. Inc supplemental O2 to 11L to maintain saturation in the 90s. Nursing notified via secure chat. Pt demonstrates deficits in strength/balance/activity tolerance. Would benefit from skilled PT to address above deficits and promote optimal return to PLOF.       If plan is discharge home, recommend the following: A lot of help with walking and/or transfers;A little help with bathing/dressing/bathroom;Assist for transportation;Help with stairs or ramp for entrance   Can travel by private vehicle   No    Equipment Recommendations Other (comment) (Defer to next venue)  Recommendations for Other Services       Functional Status Assessment Patient has had a recent decline in their functional status and demonstrates the ability to make significant improvements in function in a reasonable and predictable amount of time.     Precautions / Restrictions Precautions Precautions:  Fall Recall of Precautions/Restrictions: Intact Restrictions Weight Bearing Restrictions Per Provider Order: No      Mobility  Bed Mobility Overal bed mobility: Needs Assistance Bed Mobility: Supine to Sit     Supine to sit: Modified independent (Device/Increase time)     General bed mobility comments: use of bed features for supine>sit. Effortful.    Transfers Overall transfer level: Needs assistance Equipment used: None, Rolling walker (2 wheels) Transfers: Sit to/from Stand, Bed to chair/wheelchair/BSC Sit to Stand: Min assist   Step pivot transfers: Contact guard assist       General transfer comment: Verbal cues for hand placement. Min A with 1 person HHA for STS to maintain balance. Pt demos posterior bias upon standing. CGA for step pivot transfer to chair.    Ambulation/Gait Ambulation/Gait assistance: Min assist Gait Distance (Feet): 6 Feet Assistive device: 1 person hand held assist Gait Pattern/deviations: Step-to pattern, Narrow base of support       General Gait Details: Side steps at EOB. Min A to maintain balance and assistance for weight shift.  Stairs            Wheelchair Mobility     Tilt Bed    Modified Rankin (Stroke Patients Only)       Balance Overall balance assessment: Needs assistance Sitting-balance support: Feet supported Sitting balance-Leahy Scale: Good Sitting balance - Comments: Able to maintain sitting balance at EOB. Pt able to flex foward without LOB to don shoes.   Standing balance support: Bilateral upper extremity supported, During functional activity, Reliant on assistive device for balance Standing balance-Leahy Scale: Poor  Standing balance comment: inc postural sway with HHA and posterior lean on EOB. Pt able to correct with cuing, however required min A to maintain balance. Once given RW, pt improved steadiness.                             Pertinent Vitals/Pain Pain Assessment Pain  Assessment: No/denies pain    Home Living Family/patient expects to be discharged to:: Private residence Living Arrangements: Children Available Help at Discharge: Family;Available PRN/intermittently Type of Home: House Home Access: Stairs to enter Entrance Stairs-Rails: Right;Left;Can reach both Entrance Stairs-Number of Steps: 3-4 Alternate Level Stairs-Number of Steps: flight Home Layout: Two level;Able to live on main level with bedroom/bathroom Home Equipment: Cane - single point;Rolling Walker (2 wheels);Hand held shower head      Prior Function Prior Level of Function : Independent/Modified Independent             Mobility Comments: Reports IND with mobiltiy. Denies hx of falls. ADLs Comments: IND with ADLs.     Extremity/Trunk Assessment   Upper Extremity Assessment Upper Extremity Assessment: Generalized weakness    Lower Extremity Assessment Lower Extremity Assessment: Generalized weakness    Cervical / Trunk Assessment Cervical / Trunk Assessment: Normal  Communication   Communication Communication: No apparent difficulties    Cognition Arousal: Alert Behavior During Therapy: WFL for tasks assessed/performed   PT - Cognitive impairments: No apparent impairments                       PT - Cognition Comments: Pleasant and agreeable to PT Following commands: Intact       Cueing Cueing Techniques: Verbal cues, Visual cues     General Comments General comments (skin integrity, edema, etc.): Pt on 5L of O2 upon entry to room. At rest, pt noted to desat to low 80s without recovery despite PLBing. Pt required inc of supplemental O2 to 11L to recover to low 90s.    Exercises     Assessment/Plan    PT Assessment Patient needs continued PT services  PT Problem List Decreased strength;Decreased activity tolerance;Decreased balance;Decreased knowledge of use of DME;Cardiopulmonary status limiting activity       PT Treatment Interventions  Gait training;Stair training;DME instruction;Functional mobility training;Therapeutic activities;Therapeutic exercise;Balance training;Neuromuscular re-education;Patient/family education    PT Goals (Current goals can be found in the Care Plan section)  Acute Rehab PT Goals Patient Stated Goal: none stated PT Goal Formulation: With patient Time For Goal Achievement: 01/03/24 Potential to Achieve Goals: Fair    Frequency Min 2X/week     Co-evaluation               AM-PAC PT 6 Clicks Mobility  Outcome Measure Help needed turning from your back to your side while in a flat bed without using bedrails?: A Little Help needed moving from lying on your back to sitting on the side of a flat bed without using bedrails?: A Little Help needed moving to and from a bed to a chair (including a wheelchair)?: A Little Help needed standing up from a chair using your arms (e.g., wheelchair or bedside chair)?: A Little Help needed to walk in hospital room?: A Lot Help needed climbing 3-5 steps with a railing? : A Lot 6 Click Score: 16    End of Session Equipment Utilized During Treatment: Oxygen Activity Tolerance: Patient tolerated treatment well Patient left: in chair;with call bell/phone within reach Nurse Communication: Mobility  status PT Visit Diagnosis: Unsteadiness on feet (R26.81);Muscle weakness (generalized) (M62.81)    Time: 8888-8866 PT Time Calculation (min) (ACUTE ONLY): 22 min   Charges:                 Analy Bassford, SPT   Cattleya Dobratz 12/20/2023, 11:59 AM

## 2023-12-20 NOTE — Telephone Encounter (Signed)
 Patient Product/process Development Scientist completed.    The patient is insured through [facility]. Patient has Medicare and is not eligible for a copay card, but may be able to apply for patient assistance or Medicare RX Payment Plan (Patient Must reach out to their plan, if eligible for payment plan), if available.  Ran test claim for Farxiga 10 mg and the current 30 day co-pay is $0.00.  Ran test claim for Jardiance 10 mg and the current 30 day co-pay is $0.00.    This test claim was processed through [facility]- copay amounts may vary at other pharmacies due to pharmacy/plan contracts, or as the patient moves through the different stages of their insurance plan.     Reyes Sharps, CPHT Pharmacy Technician Patient Advocate Specialist Lead [facility] Patient Advocate Team Direct Number: [phone]  Fax: [phone]

## 2023-12-20 NOTE — Evaluation (Signed)
 Occupational Therapy Evaluation Patient Details Name: Todd Whitehead MRN: [id number] DOB: [birth year] Today's Date: 12/20/2023   History of Present Illness   Todd Whitehead is a 78 y.o. year old male with medical history of hypertension, hyperlipidemia, type 2 diabetes, COPD, CHF, chronic hypoxic respiratory failure on 4 L, recent diagnosis of localized non-small cell lung cancer presented with generalized weakness and shortness of breath.     Clinical Impressions Chart reviewed to date, pt greeted in chair, agreeable to OT evaluation. ?problem solving deficits, will continue to assess. Pt reports he recently moved in with his daughter and her family, he performs ADLs with MOD I, they assist with IADLs, he manages his own meds however. Pt presents with deficits in activity tolerance, endurance, balance affecting safe and optimal ADL completion. Pt is performing ADL/functional mobility below PLOF, will benefit from acute OT to address functional deficits and to facilitate optimal ADL/functional mobility performance.      If plan is discharge home, recommend the following:   A little help with walking and/or transfers;A little help with bathing/dressing/bathroom;Assist for transportation;Assistance with cooking/housework;Help with stairs or ramp for entrance     Functional Status Assessment   Patient has had a recent decline in their functional status and demonstrates the ability to make significant improvements in function in a reasonable and predictable amount of time.     Equipment Recommendations   BSC/3in1;Tub/shower bench     Recommendations for Other Services         Precautions/Restrictions   Precautions Precautions: Fall Recall of Precautions/Restrictions: Intact Restrictions Weight Bearing Restrictions Per Provider Order: No     Mobility Bed Mobility               General bed mobility comments: NT in recliner pre/post sessino     Transfers Overall transfer level: Needs assistance Equipment used: Rolling walker (2 wheels) Transfers: Sit to/from Stand Sit to Stand: Contact guard assist, Supervision                  Balance Overall balance assessment: Needs assistance Sitting-balance support: Feet supported Sitting balance-Leahy Scale: Good     Standing balance support: Bilateral upper extremity supported, During functional activity, Reliant on assistive device for balance Standing balance-Leahy Scale: Fair                             ADL either performed or assessed with clinical judgement   ADL Overall ADL's : Needs assistance/impaired Eating/Feeding: Sitting;Supervision/ safety                   Lower Body Dressing: Supervision/safety Lower Body Dressing Details (indicate cue type and reason): simulated doffing shoe Toilet Transfer: Contact guard assist;Rolling walker (2 wheels);Ambulation Toilet Transfer Details (indicate cue type and reason): simulated         Functional mobility during ADLs: Supervision/safety;Contact guard assist;Rolling walker (2 wheels) (approx 10')       Vision Patient Visual Report: No change from baseline       Perception         Praxis         Pertinent Vitals/Pain Pain Assessment Pain Assessment: No/denies pain     Extremity/Trunk Assessment Upper Extremity Assessment Upper Extremity Assessment: Generalized weakness   Lower Extremity Assessment Lower Extremity Assessment: Generalized weakness   Cervical / Trunk Assessment Cervical / Trunk Assessment: Normal   Communication Communication Communication: No apparent difficulties   Cognition Arousal: Alert  Behavior During Therapy: WFL for tasks assessed/performed Cognition: Cognition impaired       Memory impairment (select all impairments): Short-term memory   Executive functioning impairment (select all impairments): Problem solving OT - Cognition Comments: will  continue to assess                 Following commands: Intact       Cueing  General Comments   Cueing Techniques: Verbal cues;Visual cues  Pt on 11L via HFNC, spo2 >90% throughout   Exercises Other Exercises Other Exercises: edu re role of OT, role of rehab, discharge recommendations, safer ADL completion with AE/DME   Shoulder Instructions      Home Living Family/patient expects to be discharged to:: Private residence Living Arrangements: Children Available Help at Discharge: Family;Available PRN/intermittently Type of Home: House Home Access: Stairs to enter [employer] of Steps: 3-4 Entrance Stairs-Rails: Right;Left;Can reach both Home Layout: Two level;Able to live on main level with bedroom/bathroom Alternate Level Stairs-Number of Steps: flight   Bathroom Shower/Tub: Chief Strategy Officer: Standard     Home Equipment: Cane - single Librarian, Academic (2 wheels);Hand held shower head          Prior Functioning/Environment Prior Level of Function : Independent/Modified Independent             Mobility Comments: amb with no AD, does use oxygen ADLs Comments: MOD I with ADL, assist for cooking,driving from family; pt report he manages his own meds    OT Problem List: Decreased activity tolerance;Decreased knowledge of use of DME or AE;Decreased safety awareness;Decreased strength;Impaired balance (sitting and/or standing);Decreased cognition;Cardiopulmonary status limiting activity   OT Treatment/Interventions: Self-care/ADL training;Therapeutic exercise;Patient/family education;Energy conservation;DME and/or AE instruction;Therapeutic activities;Balance training      OT Goals(Current goals can be found in the care plan section)   Acute Rehab OT Goals Patient Stated Goal: go home OT Goal Formulation: With patient Time For Goal Achievement: 01/03/24 Potential to Achieve Goals: Fair ADL Goals Pt Will Perform Upper Body  Bathing: with modified independence;sitting;standing Pt Will Perform Lower Body Dressing: with modified independence;sitting/lateral leans;sit to/from stand Pt Will Transfer to Toilet: with modified independence;ambulating Pt Will Perform Toileting - Clothing Manipulation and hygiene: with modified independence;sitting/lateral leans;sit to/from stand   OT Frequency:  Min 2X/week    Co-evaluation              AM-PAC OT 6 Clicks Daily Activity     Outcome Measure Help from another person eating meals?: None Help from another person taking care of personal grooming?: None Help from another person toileting, which includes using toliet, bedpan, or urinal?: A Little Help from another person bathing (including washing, rinsing, drying)?: A Little Help from another person to put on and taking off regular upper body clothing?: None Help from another person to put on and taking off regular lower body clothing?: None 6 Click Score: 22   End of Session Equipment Utilized During Treatment: Rolling walker (2 wheels);Oxygen Nurse Communication: Mobility status  Activity Tolerance: Patient tolerated treatment well Patient left: in chair;with call bell/phone within reach;with chair alarm set  OT Visit Diagnosis: Other abnormalities of gait and mobility (R26.89)                Time: 8862-8844 OT Time Calculation (min): 18 min Charges:  OT General Charges $OT Visit: 1 Visit OT Evaluation $OT Eval Moderate Complexity: 1 Mod  Therisa Sheffield, OTD OTR/L  12/20/23, 3:33 PM

## 2023-12-20 NOTE — Consult Note (Signed)
 Consultation Note Date: 12/20/2023   Patient Name: Todd Whitehead  DOB: 1945/12/16  MRN: [id number]  Age / Sex: 78 y.o., male  PCP: Lenon Layman ORN, MD Referring Physician: Maree Hue, MD  Reason for Consultation: Establishing goals of care   HPI/Brief Hospital Course: 78 y.o. male  with past medical history of HTN, HLD, T@DM , COPD with chronic respiratory failure on 4L [place] at baseline, CHF and recent diagnosis of non-small cell carcinoma (s/p bronchoscopy with biopsy with Dr. Parris on 10/31) admitted from home on 12/18/2023 with generalized weakness and shortness of breath.  In ED a skin tear was noted to his arm he reports nonstop bleeding for several days.  On arrival to ED hemoglobin found to be 6.4, proBNP at 5700 Admitted and being treated for symptomatic anemia as well as COPD and CHF exacerbation--attempts being made to wean supplemental oxygen back to his baseline of 4 L  Palliative medicine was consulted for assisting with goals of care conversations.  Subjective:  Extensive chart review has been completed prior to meeting patient including labs, vital signs, imaging, progress notes, orders, and available advanced directive documents from current and previous encounters.  Visited with Mr. Kocsis at his bedside.  He is awake, alert and able to engage in conversations.  He does standing up in recliner during time of visit.  No family or visitors at bedside during time of visit.  Introduced myself as a publishing rights manager as a member of the palliative care team. Explained palliative medicine is specialized medical care for people living with serious illness. It focuses on providing relief from the symptoms and stress of a serious illness. The goal is to improve quality of life for both the patient and the family.   Mr. Scheibel provides a brief life review.  He shares he has an ex-wife that recently passed away.  In total he has 2 children-1 daughter  Bari and 1 son Cloud.  Mr. Shelton shares he has been living with his daughter Bari since his last hospitalization--a few months prior.  Prior to this last admission Mr. Amon was living and functioning at home independently.  Due to his declining health he felt as though it would be beneficial for him to move in with his daughter.  At baseline he is still able to perform ADLs independently without assistance and utilizes an assistive device to ambulate around the home.  He does continue to smoke cigarettes-about 10 cigarettes/day and enjoys smoking his cigarette on the front porch and watching the cars drive-by.  He shares he lives a fairly and active lifestyle.  He speaks to his appetite and shares it fluctuates but over the course of the last year he has lost approximately 40 pounds.  Mr. Helmstetter attempts to explain his understanding of current hospitalization and events that led to his hospitalization.  With prompting he is able to recall significant weakness and shortness of breath that led to EMS being called.  He was unable to recall anemia requiring blood transfusion.  We discussed SLP-happy coming by for a visit today for which he was unaware of.  We discussed difficulty swallowing, he denies difficulty swallowing but during our conversation it is noted he continues to have a congested cough.  Mr. Rennert shares his cough has been ongoing for several months and can be productive at times.  We discussed bronchoscopy and results of biopsy he had at the end of October.  Mr. Pokorski shares he cannot recall having a bronchoscopy but does  remember being told he had a biopsy which resulted in lung cancer.  He recalls having a visit with radiation oncologist but unsure of what resulted from that visit.  Per Dr. Trula outpatient note-plan for PET scan which has been ordered and scheduled for December 1 prior to finalizing treatment/intervention plans.  Mr. Mazo shares his frustrations about losing his independence and  not being able to live independently.  He shares his long-term goal is to be able to return to his home and function independently but aware this may not be realistic.  He has unsure at this time about what his long-term goals are although he shares he is accepting of treatment/interventions at this time and remains hopeful for continued and ongoing recovery.  In light of his recent cancer diagnosis, he shares he is ready to start treatment.  Briefly discussed concern related to his frail state.  Mr. Treinen shares he has not completed advance directives in the past.  In the event he is unable to make his own medical decisions he wishes to appoint both his daughter and son as Lauraine get education officer, environmental.  Mr. Bair provides consent for me to reach out to his children to have conversations.  Assessed symptoms.  He denies acute pain or discomfort.  Reports feeling better than when he came in.  Reports no change in his appetite and is resting well.  Attempted call to [name]-unanswered and unable to leave voicemail.  Called and spoke with son-Danzel.  Introduced myself and role to [place].  We discussed brief overview of hospitalization as well as most recent medical updates.  Frederick shares he has had conversations with primary team.  Hadriel shares concerns related to his father's declining health.  Patrik shares primary team has had discussions regarding comfort/hospice care.  Provided a brief overall philosophy of hospice services.  Colbert shares family very aware of hospice as they recently had their mother under hospice care about a year ago.  Nyeem also has concerns about his father's complete understanding of medical conditions.  Kaidyn shares he and his sister Bari are planning to visit this evening and have discussions with Mr. Ringwald regarding his current condition and options.  Shared PMT will be available through the weekend to offer assistance in having goals of care conversations if needed.  Will touch base with  family tomorrow morning regarding conversations had this evening with Mr. Ingrum.  I discussed importance of continued conversations with family/support persons and all members of their medical team regarding overall plan of care and treatment options ensuring decisions are in alignment with patients goals of care.  All questions/concerns addressed. Emotional support provided to patient/family/support persons. PMT will continue to follow and support patient as needed.  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     Comments: Frail  HENT:     Head: Normocephalic.     Mouth/Throat:     Mouth: Mucous membranes are moist.  Pulmonary:     Effort: Pulmonary effort is normal. No respiratory distress.  Skin:    General: Skin is warm and dry.  Findings: Bruising present.     Comments: Skin tear covered with dressing to left forearm  Neurological:     Mental Status: He is alert and oriented to person, place, and time.     Motor: Weakness present.  Psychiatric:        Mood and Affect: Mood normal.        Behavior: Behavior normal.        Thought Content: Thought content normal.     Vital Signs: BP (!) 163/92   Pulse 87   Temp 98.2 F (36.8 C) (Oral)   Resp 20   Ht 5' 8 (1.727 m)   Wt 47.8 kg   SpO2 100%   BMI 16.02 kg/m  Pain Scale: 0-10   Pain Score: 0-No pain  IO: Intake/output summary:  Intake/Output Summary (Last 24 hours) at 12/20/2023 1628 Last data filed at 12/20/2023 1500 Gross per 24 hour  Intake 1023 ml  Output  2850 ml  Net -1827 ml    LBM: Last BM Date : 12/19/23 Baseline Weight: Weight: 54.8 kg Most recent weight: Weight: 47.8 kg      Assessment and Plan  SUMMARY OF RECOMMENDATIONS   Family to have goals of care conversation with Mr. Kuenzel tonight PMT to follow-up regarding conversations had and continue goals of care conversations from there  Palliative Prophylaxis:   Bowel Regimen, Delirium Protocol and Frequent Pain Assessment  Discussed With: Primary team   Thank you for this consult and allowing Palliative Medicine to participate in the care of Lynwood HILARIO Jama. Palliative medicine will continue to follow and assist as needed.   Visit includes: Detailed review of medical records (labs, imaging, vital signs), medically appropriate exam (mental status, respiratory, cardiac, skin), discussed with treatment team, counseling and educating patient, family and staff, documenting clinical information, medication management and coordination of care.   Signed by: Waddell Lesches, DNP, AGNP-C Palliative Medicine    Please contact Palliative Medicine Team phone at [phone] for questions and concerns.  For individual provider: See Tracey

## 2023-12-21 DIAGNOSIS — J441 Chronic obstructive pulmonary disease with (acute) exacerbation: Secondary | ICD-10-CM | POA: Diagnosis not present

## 2023-12-21 DIAGNOSIS — Z711 Person with feared health complaint in whom no diagnosis is made: Secondary | ICD-10-CM | POA: Diagnosis not present

## 2023-12-21 DIAGNOSIS — I5022 Chronic systolic (congestive) heart failure: Secondary | ICD-10-CM

## 2023-12-21 DIAGNOSIS — D649 Anemia, unspecified: Secondary | ICD-10-CM | POA: Diagnosis not present

## 2023-12-21 DIAGNOSIS — J9611 Chronic respiratory failure with hypoxia: Secondary | ICD-10-CM | POA: Diagnosis not present

## 2023-12-21 DIAGNOSIS — Z515 Encounter for palliative care: Secondary | ICD-10-CM | POA: Diagnosis not present

## 2023-12-21 DIAGNOSIS — I2510 Atherosclerotic heart disease of native coronary artery without angina pectoris: Secondary | ICD-10-CM | POA: Diagnosis not present

## 2023-12-21 DIAGNOSIS — J189 Pneumonia, unspecified organism: Secondary | ICD-10-CM | POA: Diagnosis not present

## 2023-12-21 LAB — CBC
HCT: 24.7 % — ABNORMAL LOW (ref 39.0–52.0)
Hemoglobin: 8 g/dL — ABNORMAL LOW (ref 13.0–17.0)
MCH: 27.7 pg (ref 26.0–34.0)
MCHC: 32.4 g/dL (ref 30.0–36.0)
MCV: 85.5 fL (ref 80.0–100.0)
Platelets: 355 K/uL (ref 150–400)
RBC: 2.89 MIL/uL — ABNORMAL LOW (ref 4.22–5.81)
RDW: 14.5 % (ref 11.5–15.5)
WBC: 21.2 K/uL — ABNORMAL HIGH (ref 4.0–10.5)
nRBC: 0.1 % (ref 0.0–0.2)

## 2023-12-21 LAB — BASIC METABOLIC PANEL WITH GFR
Anion gap: 6 (ref 5–15)
BUN: 28 mg/dL — ABNORMAL HIGH (ref 8–23)
CO2: 35 mmol/L — ABNORMAL HIGH (ref 22–32)
Calcium: 9.4 mg/dL (ref 8.9–10.3)
Chloride: 99 mmol/L (ref 98–111)
Creatinine, Ser: 0.99 mg/dL (ref 0.61–1.24)
GFR, Estimated: >60 mL/min (ref >60)
Glucose, Bld: 118 mg/dL — ABNORMAL HIGH (ref 70–99)
Potassium: 3.5 mmol/L (ref 3.5–5.1)
Sodium: 141 mmol/L (ref 135–145)

## 2023-12-21 LAB — GLUCOSE, CAPILLARY: Glucose-Capillary: 112 mg/dL — ABNORMAL HIGH (ref 70–99)

## 2023-12-21 MED ORDER — PREDNISONE 20 MG PO TABS
20.0000 mg | ORAL_TABLET | Freq: Every day | ORAL | Status: DC
  Administered 2023-12-22: 20 mg via ORAL
  Filled 2023-12-21: qty 1

## 2023-12-21 NOTE — Plan of Care (Signed)
  Problem: Nutrition: Goal: Adequate nutrition will be maintained Outcome: Progressing   Problem: Elimination: Goal: Will not experience complications related to urinary retention Outcome: Progressing   Problem: Pain Managment: Goal: General experience of comfort will improve and/or be controlled Outcome: Progressing   Problem: Safety: Goal: Ability to remain free from injury will improve Outcome: Progressing   Problem: Skin Integrity: Goal: Risk for impaired skin integrity will decrease Outcome: Progressing

## 2023-12-21 NOTE — Progress Notes (Signed)
 Daily Progress Note   Patient Name: Todd Whitehead       Date: 12/21/2023 DOB: [birth year]  Age: 78 y.o. MRN#: [id number] Attending Physician: Maree Hue, MD Primary Care Physician: Lenon Layman ORN, MD Admit Date: 12/18/2023  Reason for Consultation/Follow-up: Establishing goals of care  HPI/Brief Hospital Review: 78 y.o. male  with past medical history of HTN, HLD, T2DM, COPD with chronic respiratory failure on 4L [place] at baseline, CHF and recent diagnosis of non-small cell carcinoma (s/p bronchoscopy with biopsy with Dr. Parris on 10/31) admitted from home on 12/18/2023 with generalized weakness and shortness of breath.  In ED a skin tear was noted to his arm he reports nonstop bleeding for several days.   On arrival to ED hemoglobin found to be 6.4, proBNP at 5700 Admitted and being treated for symptomatic anemia as well as COPD and CHF exacerbation--attempts being made to wean supplemental oxygen back to his baseline of 4 L   Palliative medicine was consulted for assisting with goals of care conversations.  Subjective: Extensive chart review has been completed prior to meeting patient including labs, vital signs, imaging, progress notes, orders, and available advanced directive documents from current and previous encounters.    Visited with Todd Whitehead at his bedside.  He is awake, alert and able to engage in conversations.  Nursing staff at bedside.  Breakfast tray at bedside, Todd Whitehead attempting to eat his breakfast.  No family or visitors present at time of visit.  It is noted while attempting to consume breakfast Todd Whitehead is able to take 2-3 bites of food or 2-3 sips of liquid and then requires a prolonged recovery as his respiratory rate increases and he becomes increasingly short of  breath.  Emphasized concerns related to his high risk of aspiration.  Encouraged Mr. Vensel to continue consuming a few bites at a time with taking a time for recovery to avoid aspiration.  Has been successfully weaned to 4 L nasal cannula which is his baseline.  Inquired Todd Whitehead understanding of conversations had with his son and daughter yesterday evening.  Todd Whitehead is unable to recall specifics to the conversation but share they were both able to speak with the physician?  Assessed symptoms.  Todd Whitehead denies acute pain or discomfort, acknowledges increased shortness of breath with meals but  reports no distress with recovery. He is resting well at night and overall shares he feels good today.  Called and spoke with son-Ramadan to inquire about conversation had last night. Aithan shares he, his sister-[name] and his brother-in-law, Oneil were able to visit at bedside last night and have conversations with Todd Whitehead regarding his GOC. Kannen shared the family felt as though Todd Whitehead was improving. They discussed hospice for which the family did not feel he was ready for and Todd Whitehead shared with them he was not interested in hospice at this time.  Verbalized concern to [facility] regarding assessment of significant deconditioning as seen with eating and drinking. Emphasized the concern for the prolonged recovery time required for Todd Whitehead to eat/drink a few bites/sips. Discussed this places an increased risk on continued malnutrition due to the degree of energy needed for him to consume a few bites. We also discussed the high risk of aspiration and recommendation for MBS--emphasized concern if aspiration is assessed during MSB diet will be downgraded to attempt to avoid aspiration which further leads to risk of ongoing malnutrition. We also discussed the severity of his underlying pulmonary and cardiac disease. Discussed overall prognosis. Lynwood requested I also call and speak with his sister-[name].  Called and spoke with  Pamplico. Provided brief overview of hospitalization as well as most recent medical updates as she has not been in contact with providers since hospitalization. Bari confirms, Todd Whitehead has been living with her and her family, she has felt as though over the last several week Mr. Roughton was improving. She speaks to improvement in his functional status and appetite.Bari also speaks to conversations had with her brother and Todd Whitehead last evening. Bari shares at this time they do not feel Todd Whitehead is appropriate for hospice services. Discussed with Bari concern related to his significant deconditioned state as well as assessment with his eating this morning requiring prolonged recovery period. Also discussed recommendation for MBS as well as concern for downgraded diet and ongoing poor nutrition. We also discussed in detail the severity of his underlying pulmonary and cardiac disease. Encouraged Bari to have continued GOC conversations with her family.  In conversations with both Lynwood and Bari, voiced concern for Todd Whitehead ability to fully understand complex medical decisions. Todd Whitehead has been clear that his goal is to live independently although we have discussed the severity of his conditions in detail.  Answered and addressed all questions and concerns.  PMT to continue to follow for ongoing needs and support.  Objective:  Physical Exam Constitutional:      General: He is not in acute distress.    Appearance: He is cachectic. He is ill-appearing.     Comments: Frail  HENT:     Head: Normocephalic.     Mouth/Throat:     Mouth: Mucous membranes are dry.  Pulmonary:     Effort: Pulmonary effort is normal. No respiratory distress.     Comments: Congested cough Abdominal:     General: Abdomen is flat. There is no distension.     Palpations: Abdomen is soft.  Skin:    General: Skin is warm and dry.     Findings: Bruising present.     Comments: Left arm laceration--covered with dressing   Neurological:     Mental Status: He is alert and oriented to person, place, and time.     Motor: Weakness present.  Psychiatric:        Mood and Affect: Mood normal.  Behavior: Behavior normal.        Thought Content: Thought content normal.             Vital Signs: BP (!) 177/70 (BP Location: Right Arm)   Pulse 85   Temp 98 F (36.7 C)   Resp 20   Ht 5' 8 (1.727 m)   Wt 47.8 kg   SpO2 94%   BMI 16.02 kg/m  SpO2: SpO2: 94 % O2 Device: O2 Device: Nasal Cannula O2 Flow Rate: O2 Flow Rate (L/min): 4 L/min   Palliative Care Assessment & Plan   Assessment/Recommendation/Plan  Ongoing GOC conversations with patient and family PMT to continue to follow for ongoing needs and support  Thank you for allowing the Palliative Medicine Team to assist in the care of this patient.  Visit includes: Detailed review of medical records (labs, imaging, vital signs), medically appropriate exam (mental status, respiratory, cardiac, skin), discussed with treatment team, counseling and educating patient, family and staff, documenting clinical information, medication management and coordination of care.  Waddell Lesches, DNP, AGNP-C Palliative Medicine   Please contact Palliative Medicine Team phone at [phone] for questions and concerns.

## 2023-12-21 NOTE — Progress Notes (Signed)
 Speech Language Pathology Treatment: Dysphagia  Patient Details Name: Todd Whitehead MRN: [id number] DOB: 05-15-1945 Today's Date: 12/21/2023 Time: 9169-9149 SLP Time Calculation (min) (ACUTE ONLY): 20 min  Assessment / Plan / Recommendation Clinical Impression  Pt seen for follow up dysphagia intervention. Pt remains on 4L nasal canula (consistent with baseline). Congested cough present, non productive with prolonged recovery noted prior to completion of PO trials. Pt deferred solids at this time reporting plan to enjoy breakfast. Pt seen with trials of thin liquid via cup with no overt coughing/throat clear. However, noted increase in work of breathing with consecutive sips, requiring prolonged recovery. Education shared regarding need for slow, controlled intake to reduce impact on respiratory rate. Pt reported awareness for increase in work of breathing with consecutive sips and need to rest intermittently with intake.   Overall, pt's acute on chronic pulmonary/respiratory status and deconditioning, greatly increase risk for aspiration and adverse effects of aspiration. Therefore, recommend strict aspiration precautions (slow, single CUP sips, small bites, elevated HOB, and alert for PO intake). Continue with regular solids and thin liquids. MD and RN aware of risk and recommendations. SLP will continue to monitor for need for instrumental assessment and dysphagia intervention as indicated by GOC.    HPI HPI: Todd Whitehead is a 78 y.o. year old male with medical history of hypertension, hyperlipidemia, type 2 diabetes, COPD, CHF, chronic hypoxic respiratory failure on 4 L, recent diagnosis of localized non-small cell lung cancer presented with generalized weakness and shortness of breath. Chest x-ray on 02/17/2023 revealed 1. Advanced emphysematous changes and pulmonary scarring. 2. Superimposed patchy airspace opacity in the right mid and lower lungs worrisome for superimposed pneumonia.       SLP Plan  Continue with current plan of care (monitor for need of MBSS)          Recommendations  Diet recommendations: Regular;Thin liquid Liquids provided via: Cup Medication Administration: Whole meds with liquid Supervision: Patient able to self feed;Intermittent supervision to cue for compensatory strategies Compensations: Minimize environmental distractions;Slow rate;Small sips/bites Postural Changes and/or Swallow Maneuvers: Seated upright 90 degrees;Upright 30-60 min after meal                  Oral care BID   Intermittent Supervision/Assistance Dysphagia, oropharyngeal phase (R13.12)     Continue with current plan of care (monitor for need of MBSS)   Todd Gowans Clapp, MS, CCC-SLP Speech Language Pathologist Rehab Services; [facility] [phone] (ascom)    Todd Whitehead  12/21/2023, 11:34 AM

## 2023-12-21 NOTE — Plan of Care (Signed)

## 2023-12-21 NOTE — Progress Notes (Signed)
 Physical Therapy Treatment Patient Details Name: Todd Whitehead MRN: [id number] DOB: 1945-09-15 Today's Date: 12/21/2023   History of Present Illness Todd Whitehead is a 78 y.o. year old male with medical history of hypertension, hyperlipidemia, type 2 diabetes, COPD, CHF, chronic hypoxic respiratory failure on 4 L, recent diagnosis of localized non-small cell lung cancer presented with generalized weakness and shortness of breath.    PT Comments  Patient seen for PT session focused on OOB mobility. Patient required minA for ambulation (3 bouts of 10' feet) and used RW. Tolerated session well with signs of exertion. O2 saturation decreased with low level movement requires increased time to recover. Main limiting factors today were movement tolerance. Interventions aimed at improving aerobic endurance and activity tolerance. Patient shows good potential to make progress with continued acute level rehab. Patient continues to demonstrate moderate activity restrictions and poor tolerance for progressive mobility. Continued skilled PT recommended to progress toward functional goals and support discharge readiness. Pt making good progress toward goals, will continue to follow POC. Discharge recommendation remains appropriate     If plan is discharge home, recommend the following: A lot of help with walking and/or transfers;A little help with bathing/dressing/bathroom;Assist for transportation;Help with stairs or ramp for entrance   Can travel by private vehicle     No  Equipment Recommendations  Other (comment)    Recommendations for Other Services       Precautions / Restrictions Precautions Precautions: Fall Recall of Precautions/Restrictions: Intact Restrictions Weight Bearing Restrictions Per Provider Order: No     Mobility  Bed Mobility Overal bed mobility: Needs Assistance Bed Mobility: Supine to Sit     Supine to sit: Modified independent (Device/Increase time)           Transfers Overall transfer level: Needs assistance Equipment used: Rolling walker (2 wheels) Transfers: Sit to/from Stand Sit to Stand: Contact guard assist, Supervision                Ambulation/Gait Ambulation/Gait assistance: Min assist Gait Distance (Feet): 30 Feet Assistive device: 1 person hand held assist Gait Pattern/deviations: Step-to pattern, Narrow base of support       General Gait Details: 3x 10' feet with RW required rest break to recover   Stairs             Wheelchair Mobility     Tilt Bed    Modified Rankin (Stroke Patients Only)       Balance Overall balance assessment: Needs assistance Sitting-balance support: Feet supported Sitting balance-Leahy Scale: Good Sitting balance - Comments: Able to maintain sitting balance at EOB. Pt able to flex foward without LOB to don shoes.   Standing balance support: Bilateral upper extremity supported, During functional activity, Reliant on assistive device for balance Standing balance-Leahy Scale: Fair                              Hotel Manager: No apparent difficulties  Cognition Arousal: Alert Behavior During Therapy: WFL for tasks assessed/performed                             Following commands: Intact      Cueing Cueing Techniques: Verbal cues, Visual cues  Exercises      General Comments        Pertinent Vitals/Pain Pain Assessment Pain Assessment: No/denies pain    Home Living  Prior Function            PT Goals (current goals can now be found in the care plan section) Acute Rehab PT Goals Patient Stated Goal: none stated PT Goal Formulation: With patient Time For Goal Achievement: 01/03/24 Potential to Achieve Goals: Fair Progress towards PT goals: Progressing toward goals    Frequency    Min 2X/week      PT Plan      Co-evaluation              AM-PAC PT 6  Clicks Mobility   Outcome Measure  Help needed turning from your back to your side while in a flat bed without using bedrails?: A Little Help needed moving from lying on your back to sitting on the side of a flat bed without using bedrails?: A Little Help needed moving to and from a bed to a chair (including a wheelchair)?: A Little Help needed standing up from a chair using your arms (e.g., wheelchair or bedside chair)?: A Little Help needed to walk in hospital room?: A Little Help needed climbing 3-5 steps with a railing? : A Lot 6 Click Score: 17    End of Session Equipment Utilized During Treatment: Oxygen Activity Tolerance: Patient tolerated treatment well Patient left: in bed;with bed alarm set;with call bell/phone within reach Nurse Communication: Mobility status PT Visit Diagnosis: Unsteadiness on feet (R26.81);Muscle weakness (generalized) (M62.81)     Time: 8468-8441 PT Time Calculation (min) (ACUTE ONLY): 27 min  Charges:    $Therapeutic Activity: 8-22 mins PT General Charges $$ ACUTE PT VISIT: 1 Visit                     Sherlean Lesches DPT, PT     Sherlean A Maze Corniel 12/21/2023, 4:03 PM

## 2023-12-21 NOTE — Progress Notes (Signed)
 PROGRESS NOTE    Todd Whitehead  FMW:[id number] DOB: 1945/04/07 DOA: 12/18/2023 PCP: Lenon Layman ORN, MD   Brief Narrative:    78 y.o. year old male with medical history of hypertension, hyperlipidemia, type 2 diabetes, COPD, CHF, chronic hypoxic respiratory failure on 4 L, recent diagnosis of localized non-small cell lung cancer presented to the ED with generalized weakness and shortness of breath   11/20: restarted xanax , trazodone , WOC, Palliative care & Nutritionist c/s 11/21: transfer to med-surg, DC tele. ST eval 11/22: Tapering off prednisone .  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 He has skin tear from injury - possibly bleed from the area -. No hematemesis, or melena reported by the patient. WOC c/s - dressing in place S/p 1 PRBC transfusion and stable H & H   Left upper extremity laceration: bleeding has subsided. Dressing per [facility] nurse applied. No further bleeding   CAP: Patient with worsening cough and sputum production with x-ray showing concern for pneumonia.  This is redemonstrated by CT.   Continue Rocephin  and azithromycin  to complete 5 days course.    COPD exacerbation: given dyspnea and worsening cough with sputum production. Complete 5 days of prednisone  (10 mg) taper. Likely exacerbated by CAP   Chronic diastolic CHF: s/p  Lasix  40 mg IV once. Euvolemic now.   Hyperlipidemia: Continue home statin   Tobacco use disorder: Discussed complete cessation by admitting TRH. Patient would like to think about this.  Declines NRT.   Non-small cell lung cancer: Recently diagnosed.  Patient to get radiation therapy. Overall poor prognosis, Palliative care c/s - son inclined on keeping him comfortable and would like to d/w sister, father before decision   MDD/GAD, Insomnia: continue home meds, resume Trazodone , Xanax   Severe Malnutrition related to chronic illness (COPD, CHF, newly diagnosed lung cancer) as evidenced by moderate fat depletion, severe fat depletion, moderate muscle depletion, severe muscle depletion. Nutritionist input appreciated He remains at high risk for recurrent aspiration  GOC - PC c/s - family meeting today to decide next steps   DVT prophylaxis: SCDs SCDs Start: 12/18/23 1605     Code Status: DNR Family Communication: Updated son over the phone Disposition Plan: possible DC in 1-2 days depending on clinical condition    Antimicrobials:  Rocephin  Azithromycin     Subjective:  Remains about same.  No new issues.  Objective: Vitals:   12/20/23 2104 12/21/23 0358 12/21/23 0500 12/21/23 0748  BP:  (!) 176/80  (!) 177/70  Pulse:  88  85  Resp:  18  20  Temp:    98 F (36.7 C)  TempSrc:      SpO2: 94% 96%  94%  Weight:   47.8 kg   Height:        Intake/Output Summary (Last 24 hours) at 12/21/2023 1305 Last data filed at 12/21/2023 1100  Gross per 24 hour  Intake 820 ml  Output 1800 ml  Net -980 ml   Filed Weights   12/19/23 0428 12/20/23 0434 12/21/23 0500  Weight: 54.8 kg 47.8 kg 47.8 kg    Examination:  General exam: Cachectic and chronically ill looking Respiratory system: decreased breath sounds in bases, Rhonchi + at RLL Cardiovascular system: S1 & S2 heard, RRR. No murmurs Gastrointestinal system: Abdomen is soft, benign Central nervous system: Alert and  oriented. No focal neurological deficits. Extremities:  dressing + on Left UE Psychiatry: Judgement and insight appear normal. Mood & affect appropriate.     Data Reviewed: I have personally reviewed following labs and imaging studies  CBC: Recent Labs  Lab 12/18/23 1354 12/18/23 1959 12/19/23 0303 12/21/23 0546  WBC 13.0*  --  19.7* 21.2*  NEUTROABS 10.5*  --   --   --   HGB 6.4* 6.6* 8.6* 8.0*  HCT 20.7* 20.1* 26.6* 24.7*  MCV 89.6  --  84.7 85.5  PLT 259  --  312 355   Basic Metabolic Panel: Recent Labs  Lab 12/18/23 1354 12/19/23 0303 12/21/23 0546  NA 138 140 141  K 3.8 4.1 3.5  CL 101 99 99  CO2 30 31 35*  GLUCOSE 164* 156* 118*  BUN 17 16 28*  CREATININE 1.03 1.07 0.99  CALCIUM  9.1 9.1 9.4  MG 3.2*  --   --    GFR: Estimated Creatinine Clearance: 41.6 mL/min (by C-G formula based on SCr of 0.99 mg/dL). Liver Function Tests: Recent Labs  Lab 12/18/23 1354  AST 35  ALT 28  ALKPHOS 119  BILITOT 0.4  PROT 6.5  ALBUMIN  3.4*    BNP (last 3 results) Recent Labs    12/18/23 1354  PROBNP 5,709.0*   HbA1C: No results for input(s): HGBA1C in the last 72 hours. CBG: Recent Labs  Lab 12/19/23 0739 12/19/23 1138 12/19/23 1600 12/20/23 0728 12/21/23 0748  GLUCAP 165* 233* 130* 131* 112*    Anemia Panel: Recent Labs    12/18/23 1548  FERRITIN 220  TIBC 269  IRON <10*   Sepsis Labs: Recent Labs  Lab 12/18/23 1354 12/18/23 1548 12/19/23 0758 12/19/23 1118  LATICACIDVEN 2.4* 2.3* 1.0 1.3    Recent Results (from the past 240 hours)  Resp panel by RT-PCR (RSV, Flu A&B, Covid) Anterior Nasal Swab     Status: None   Collection Time: 12/18/23  1:54 PM   Specimen: Anterior Nasal Swab  Result Value Ref Range Status   SARS Coronavirus 2 by RT PCR NEGATIVE NEGATIVE Final    Comment: (NOTE) SARS-CoV-2 target nucleic acids are NOT DETECTED.  The SARS-CoV-2 RNA is generally detectable in upper respiratory specimens during the acute phase of  infection. The lowest concentration of SARS-CoV-2 viral copies this assay can detect is 138 copies/mL. A negative result does not preclude SARS-Cov-2 infection and should not be used as the sole basis for treatment or other patient management decisions. A negative result may occur with  improper specimen collection/handling, submission of specimen other than nasopharyngeal swab, presence of viral mutation(s) within the areas targeted by this assay, and inadequate number of viral copies(<138 copies/mL). A negative result must be combined with clinical observations, patient history, and epidemiological information. The expected result is Negative.  Fact Sheet for Patients:  bloggercourse.com  Fact Sheet for Healthcare Providers:  seriousbroker.it  This test is no t yet approved or cleared by the [place]  FDA and  has been authorized for detection  and/or diagnosis of SARS-CoV-2 by FDA under an Emergency Use Authorization (EUA). This EUA will remain  in effect (meaning this test can be used) for the duration of the COVID-19 declaration under Section 564(b)(1) of the Act, 21 U.S.C.section 360bbb-3(b)(1), unless the authorization is terminated  or revoked sooner.       Influenza A by PCR NEGATIVE NEGATIVE Final   Influenza B by PCR NEGATIVE NEGATIVE Final    Comment: (NOTE) The Xpert Xpress SARS-CoV-2/FLU/RSV plus assay is intended as an aid in the diagnosis of influenza from Nasopharyngeal swab specimens and should not be used as a sole basis for treatment. Nasal washings and aspirates are unacceptable for Xpert Xpress SARS-CoV-2/FLU/RSV testing.  Fact Sheet for Patients: bloggercourse.com  Fact Sheet for Healthcare Providers: seriousbroker.it  This test is not yet approved or cleared by the [place]  FDA and has been authorized for detection and/or diagnosis of SARS-CoV-2  by FDA under an Emergency Use Authorization (EUA). This EUA will remain in effect (meaning this test can be used) for the duration of the COVID-19 declaration under Section 564(b)(1) of the Act, 21 U.S.C. section 360bbb-3(b)(1), unless the authorization is terminated or revoked.     Resp Syncytial Virus by PCR NEGATIVE NEGATIVE Final    Comment: (NOTE) Fact Sheet for Patients: bloggercourse.com  Fact Sheet for Healthcare Providers: seriousbroker.it  This test is not yet approved or cleared by the [place]  FDA and has been authorized for detection and/or diagnosis of SARS-CoV-2 by FDA under an Emergency Use Authorization (EUA). This EUA will remain in effect (meaning this test can be used) for the duration of the COVID-19 declaration under Section 564(b)(1) of the Act, 21 U.S.C. section 360bbb-3(b)(1), unless the authorization is terminated or revoked.  Performed at [facility], [address] [place], [place] [address]   Blood Culture (routine x 2)     Status: None (Preliminary result)   Collection Time: 12/18/23  3:24 PM   Specimen: Right Antecubital; Blood  Result Value Ref Range Status   Specimen Description RIGHT ANTECUBITAL  Final   Special Requests   Final    BOTTLES DRAWN AEROBIC AND ANAEROBIC Blood Culture results may not be optimal due to an inadequate volume of blood received in culture bottles   Culture   Final    NO GROWTH 3 DAYS Performed at [facility], [address] [place], [place] [address]    Report Status PENDING  Incomplete  Blood Culture (routine x 2)     Status: None (Preliminary result)   Collection Time: 12/18/23  3:47 PM   Specimen: BLOOD RIGHT HAND  Result Value Ref Range Status   Specimen Description BLOOD RIGHT HAND  Final   Special Requests   Final    BOTTLES DRAWN AEROBIC AND ANAEROBIC Blood Culture results may not be optimal due to an inadequate volume of blood  received in culture bottles   Culture   Final    NO GROWTH 3 DAYS Performed at [facility], [address] [place], [place] [address]    Report Status PENDING  Incomplete         Radiology Studies: No results found.       Scheduled Meds:  atorvastatin   40 mg Oral Daily   azithromycin   500 mg Oral Daily   budesonide  (PULMICORT ) nebulizer solution  0.5 mg Nebulization BID   busPIRone   30 mg Oral BID   citalopram   20 mg Oral Daily   dapagliflozin  propanediol  10 mg  Oral QAC breakfast   feeding supplement  237 mL Oral TID BM   latanoprost   1 drop Both Eyes QHS   multivitamin with minerals  1 tablet Oral Daily   [START ON 12/22/2023] predniSONE   20 mg Oral Q breakfast   QUEtiapine   50 mg Oral QHS   tamsulosin   0.4 mg Oral QPC breakfast   traZODone   50 mg Oral QHS   Continuous Infusions:  cefTRIAXone  (ROCEPHIN )  IV Stopped (12/21/23 1010)     LOS: 3 days    Time spent: 35 mins    Deyjah Kindel Maree, MD [facility] Pager [phone]  If 7PM-7AM, please contact night-coverage [email or web address]  12/21/2023, 1:05 PM

## 2023-12-22 DIAGNOSIS — D649 Anemia, unspecified: Secondary | ICD-10-CM | POA: Diagnosis not present

## 2023-12-22 DIAGNOSIS — J9611 Chronic respiratory failure with hypoxia: Secondary | ICD-10-CM

## 2023-12-22 DIAGNOSIS — J441 Chronic obstructive pulmonary disease with (acute) exacerbation: Secondary | ICD-10-CM | POA: Diagnosis not present

## 2023-12-22 DIAGNOSIS — Z515 Encounter for palliative care: Secondary | ICD-10-CM | POA: Diagnosis not present

## 2023-12-22 DIAGNOSIS — I2510 Atherosclerotic heart disease of native coronary artery without angina pectoris: Secondary | ICD-10-CM | POA: Diagnosis not present

## 2023-12-22 LAB — CBC
HCT: 24.6 % — ABNORMAL LOW (ref 39.0–52.0)
Hemoglobin: 8 g/dL — ABNORMAL LOW (ref 13.0–17.0)
MCH: 27.7 pg (ref 26.0–34.0)
MCHC: 32.5 g/dL (ref 30.0–36.0)
MCV: 85.1 fL (ref 80.0–100.0)
Platelets: 404 K/uL — ABNORMAL HIGH (ref 150–400)
RBC: 2.89 MIL/uL — ABNORMAL LOW (ref 4.22–5.81)
RDW: 14.2 % (ref 11.5–15.5)
WBC: 22.1 K/uL — ABNORMAL HIGH (ref 4.0–10.5)
nRBC: 0.1 % (ref 0.0–0.2)

## 2023-12-22 LAB — BASIC METABOLIC PANEL WITH GFR
Anion gap: 10 (ref 5–15)
BUN: 22 mg/dL (ref 8–23)
CO2: 34 mmol/L — ABNORMAL HIGH (ref 22–32)
Calcium: 9 mg/dL (ref 8.9–10.3)
Chloride: 97 mmol/L — ABNORMAL LOW (ref 98–111)
Creatinine, Ser: 0.86 mg/dL (ref 0.61–1.24)
GFR, Estimated: >60 mL/min (ref >60)
Glucose, Bld: 100 mg/dL — ABNORMAL HIGH (ref 70–99)
Potassium: 3.2 mmol/L — ABNORMAL LOW (ref 3.5–5.1)
Sodium: 141 mmol/L (ref 135–145)

## 2023-12-22 LAB — GLUCOSE, CAPILLARY: Glucose-Capillary: 103 mg/dL — ABNORMAL HIGH (ref 70–99)

## 2023-12-22 MED ORDER — AZITHROMYCIN 500 MG PO TABS: 500.0000 mg | ORAL_TABLET | Freq: Every day | ORAL | 0 refills | Status: AC

## 2023-12-22 MED ORDER — PREDNISONE 10 MG PO TABS
10.0000 mg | ORAL_TABLET | Freq: Every day | ORAL | Status: DC
Start: 2023-12-23 — End: 2023-12-22

## 2023-12-22 MED ORDER — POTASSIUM CHLORIDE CRYS ER 20 MEQ PO TBCR
40.0000 meq | EXTENDED_RELEASE_TABLET | Freq: Once | ORAL | Status: AC
  Administered 2023-12-22: 40 meq via ORAL
  Filled 2023-12-22: qty 2

## 2023-12-22 NOTE — Care Management Important Message (Signed)
 Important Message  Patient Details  Name: Todd Whitehead MRN: [id number] Date of Birth: [birth year]   Important Message Given:  Yes - Medicare IM     Brandi Armato W, CMA 12/22/2023, 1:35 PM

## 2023-12-22 NOTE — Plan of Care (Signed)

## 2023-12-22 NOTE — Plan of Care (Signed)
  Problem: Health Behavior/Discharge Planning: Goal: Ability to manage health-related needs will improve Outcome: Progressing   Problem: Clinical Measurements: Goal: Ability to maintain clinical measurements within normal limits will improve Outcome: Progressing Goal: Will remain free from infection Outcome: Progressing Goal: Diagnostic test results will improve Outcome: Progressing   Problem: Activity: Goal: Risk for activity intolerance will decrease Outcome: Progressing   Problem: Nutrition: Goal: Adequate nutrition will be maintained Outcome: Progressing

## 2023-12-22 NOTE — Progress Notes (Signed)
 Daily Progress Note   Patient Name: Todd Whitehead       Date: 12/22/2023 DOB: April 23, 1945  Age: 78 y.o. MRN#: [id number] Attending Physician: Maree Hue, MD Primary Care Physician: Lenon Layman ORN, MD Admit Date: 12/18/2023  Reason for Consultation/Follow-up: Establishing goals of care  HPI/Brief Hospital Review: 78 y.o. male  with past medical history of HTN, HLD, T2DM, COPD with chronic respiratory failure on 4L [place] at baseline, CHF and recent diagnosis of non-small cell carcinoma (s/p bronchoscopy with biopsy with Dr. Parris on 10/31) admitted from home on 12/18/2023 with generalized weakness and shortness of breath.  In ED a skin tear was noted to his arm he reports nonstop bleeding for several days.   On arrival to ED hemoglobin found to be 6.4, proBNP at 5700 Admitted and being treated for symptomatic anemia as well as COPD and CHF exacerbation--attempts being made to wean supplemental oxygen back to his baseline of 4 L   Palliative medicine was consulted for assisting with goals of care conversations.   Subjective: Extensive chart review has been completed prior to meeting patient including labs, vital signs, imaging, progress notes, orders, and available advanced directive documents from current and previous encounters.    Visited with Todd Whitehead at his bedside. He is sleeping in bed, does not acknowledge my presence in room. He appears comfortable and in no signs of acute distress. No family or visitors at bedside during time of visit.  Returned to bedside later in afternoon, son-Quintan at bedside visiting. Todd Whitehead dressed and anticipating discharge home. Plan to discharge home with [facility] back to his daughter-[name]'s home. Todd Whitehead shares he is feeling well today without acute  complaints. Has been stable on home supplement oxygen of 4L [place]. Tolerating meals fine, reiterated SLP recommendations during meals to attempt to prevent aspiration.  Answered and addressed all questions and concerns. PMT to continue to follow for ongoing needs and support.  Objective:  Physical Exam Constitutional:      General: He is not in acute distress.    Appearance: He is cachectic. He is ill-appearing.     Comments: Frail  HENT:     Head: Normocephalic.     Mouth/Throat:     Mouth: Mucous membranes are dry.  Pulmonary:     Effort: Pulmonary effort is normal. No  respiratory distress.  Abdominal:     General: Abdomen is flat.  Skin:    General: Skin is warm and dry.     Findings: Bruising present.  Neurological:     Mental Status: He is alert and oriented to person, place, and time.     Motor: Weakness present.  Psychiatric:        Mood and Affect: Mood normal.        Behavior: Behavior normal.        Thought Content: Thought content normal.             Vital Signs: BP (!) 176/68 (BP Location: Right Arm)   Pulse 77   Temp 98.1 F (36.7 C) (Oral)   Resp 19   Ht 5' 8 (1.727 m)   Wt 51.1 kg   SpO2 92%   BMI 17.13 kg/m  SpO2: SpO2: 92 % O2 Device: O2 Device: Room Air, Nasal Cannula O2 Flow Rate: O2 Flow Rate (L/min): 2 L/min   Palliative Care Assessment & Plan   Assessment/Recommendation/Plan  Discharge home today  Thank you for allowing the Palliative Medicine Team to assist in the care of this patient.  Visit includes: Detailed review of medical records (labs, imaging, vital signs), medically appropriate exam (mental status, respiratory, cardiac, skin), discussed with treatment team, counseling and educating patient, family and staff, documenting clinical information, medication management and coordination of care.  Waddell Lesches, DNP, AGNP-C Palliative Medicine   Please contact Palliative Medicine Team phone at [phone] for questions and concerns.

## 2023-12-22 NOTE — Progress Notes (Signed)
 MD order received in [facility] to discharge pt home today; verbally reviewed AVS with pt and his son, no questions voiced at this time; pt's discharge pending arrival of his daughter with his portable oxygen for discharge.  SLP previously spoke to the pt regarding swallowing and aspiration precautions.

## 2023-12-22 NOTE — Progress Notes (Signed)
 Pt's daughter present for discharge; pt placed on his portable O2 at [facility]; pt discharged via wheelchair by nursing to [facility]

## 2023-12-22 NOTE — Discharge Summary (Signed)
 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 year old male with medical history of hypertension, hyperlipidemia, type 2 diabetes, COPD, CHF, chronic hypoxic respiratory failure on 4 L, recent diagnosis of localized non-small cell lung cancer presented to the ED with generalized weakness and shortness of breath    11/20: restarted xanax , trazodone , WOC, Palliative care & Nutritionist c/s 11/21: transfer to med-surg, DC tele. ST eval 11/22: Tapering off prednisone .  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 He has skin tear from injury - possibly bleed from the area -. No hematemesis, or melena reported by the patient. WOC c/s - dressing in place S/p 1 PRBC transfusion and stable H & H   Left upper extremity laceration: bleeding has subsided. Dressing per [facility] nurse applied. No further bleeding   CAP: Patient with worsening cough and sputum production with x-ray showing concern for pneumonia.  This is redemonstrated by CT.   Improved with Abx treatment   COPD exacerbation: improved with steroids and Abx   Chronic diastolic CHF:  Euvolemic now.   Hyperlipidemia: Continue home statin   Tobacco use disorder: Counseled   Non-small cell lung cancer: Recently diagnosed.  Patient to get radiation therapy. Overall poor prognosis due to severe malnutrition   MDD/GAD, Insomnia: continue home meds   Severe Malnutrition related to chronic illness (COPD, CHF, newly diagnosed lung cancer) as evidenced by moderate fat depletion, severe fat depletion, moderate muscle depletion, severe muscle depletion. Nutritionist seen ST seen - see their recommendations. Encouraged pt to use small bites/sips slowly when eating/drinking. Pt agreed verbally to the need for following aspiration precautions especially sitting upright for all oral intake and taking his time when eating meals. Talked about food consistencies and encouraged Avoiding tougher meats/foods requiring increased chewing, which could increase WOB/SOB. Highlighted easy to eat foods and moistening foods well -- Soups.   Recommend continue his current Regular/mech soft(meats) diet w/ gravies  added to moisten foods; Thin liquids- Cup drinking and less straw use d/t air swallowed/effort of inhalation. Recommend general aspiration precautions; Pills Whole in Puree if needed for ease/safety of swallowing; tray setup  and positioning upright assistance for meals. Rest Breaks during meals       Consultants: Palliative care  Disposition: Home health Diet recommendation:  Discharge Diet Orders (From admission, onward)     Start     Ordered   12/22/23 0000  Diet - low sodium heart healthy        12/22/23 1339           Carb modified diet DISCHARGE MEDICATION: Allergies as of 12/22/2023       Reactions   Other Nausea And Vomiting   Cheese,butter,sour cream,cottage cheese   Poison Ivy Extract Rash   Poison Oak Extract Rash        Medication List     STOP taking these medications    CENTRUM ADULT 50+ MULTIGUMMIES PO   cephALEXin 500 MG capsule Commonly known as: KEFLEX   umeclidinium-vilanterol 62.5-25 MCG/ACT Aepb Commonly known as: ANORO ELLIPTA    varenicline  0.5 MG tablet Commonly known as: CHANTIX        TAKE these medications    acetaminophen  500 MG tablet Commonly known as: TYLENOL  Take 1 tablet (500 mg total) by mouth every 6 (six) hours as needed.   albuterol  108 (90 Base) MCG/ACT inhaler Commonly known as: VENTOLIN  HFA Inhale 2 puffs into the lungs every 6 (six) hours as needed for wheezing or shortness of breath.   ALPRAZolam  0.25 MG tablet Commonly known as: XANAX  Take 0.25 mg by mouth at bedtime as needed for sleep.   aspirin  EC 81 MG tablet Take 81 mg by mouth daily. Swallow whole.   atorvastatin  40 MG tablet Commonly known as: LIPITOR Take 40 mg by mouth daily.   azithromycin  500 MG tablet Commonly known as: ZITHROMAX  Take 1 tablet (500 mg total) by mouth daily for 2 days. Start taking on: December 23, 2023   brimonidine  0.1 % Soln Commonly known as: ALPHAGAN  P Place 1 drop into both eyes 2 (two) times daily.   busPIRone  30 MG tablet Commonly known as: BUSPAR  Take 30 mg by mouth 2 (two) times daily.   citalopram  20 MG tablet Commonly known as: CELEXA  Take 20 mg by mouth daily.   clopidogrel  75 MG tablet Commonly known as:  PLAVIX  TAKE 1 TABLET(75 MG) BY MOUTH DAILY   dapagliflozin  propanediol 10 MG Tabs tablet Commonly known as: Farxiga  Take 1 tablet (10 mg total) by mouth daily before breakfast.   latanoprost  0.005 % ophthalmic solution Commonly known as: XALATAN  Place 1 drop into both eyes at bedtime.   metoprolol  succinate 25 MG 24 hr tablet Commonly known as: TOPROL -XL Take 12.5 mg by mouth daily.   multivitamin with minerals Tabs tablet Take 1 tablet by mouth daily.   QUEtiapine  50 MG tablet Commonly known as: SEROQUEL  Take 50 mg by mouth at bedtime.   sacubitril -valsartan  24-26 MG Commonly known as: ENTRESTO  Take 1 tablet by mouth 2 (two) times daily.   tamsulosin  0.4 MG Caps capsule Commonly known as: FLOMAX  Take 1 capsule (0.4 mg total) by mouth daily after breakfast.   traMADol  50 MG tablet Commonly known as: ULTRAM  Take 25-50 mg by mouth 3 (three) times daily as needed.   traZODone  50 MG tablet Commonly known as: DESYREL  Take 50 mg by mouth at bedtime.  Discharge Care Instructions  (From admission, onward)           Start     Ordered   12/22/23 0000  Discharge wound care:       Comments: As above   12/22/23 1339            Follow-up Information     Lenon Layman ORN, MD Follow up.   Specialty: Internal Medicine Why: Office closed at this time patient to make own follow up appt  hospital follow up Contact information: [address] [facility] [address] [place] [place] [address] [phone]         Babara Call, MD. Schedule an appointment as soon as possible for a visit in 1 week(s).   Specialty: Oncology Why: [facility] Discharge F/UP Contact information: [address] [place] [address] [phone]         Sumner Marval HERO, NP. Schedule an appointment as soon as possible for a visit in 2 week(s).   Specialty: Emergency Medicine Why: [facility] Discharge F/UP Contact information: [address] [place] [address] [phone]                Discharge Exam: Fredricka Weights   12/20/23 0434 12/21/23 0500 12/22/23 0403  Weight: 47.8 kg 47.8 kg 51.1 kg   General exam: Cachectic and chronically ill looking Respiratory system: decreased breath sounds in bases, Rhonchi + at RLL Cardiovascular system: S1 & S2 heard, RRR. No murmurs Gastrointestinal system: Abdomen is soft, benign Central nervous system: Alert and oriented. No focal neurological deficits. Extremities:  dressing + on Left UE Psychiatry: Judgement and insight appear normal. Mood & affect appropriate.   Condition at discharge: fair  The results of significant diagnostics from this hospitalization (including imaging, microbiology, ancillary and laboratory) are listed below for reference.   Imaging Studies: CT Angio Chest PE W and/or Wo Contrast Result Date: 12/18/2023 CLINICAL DATA:  Shortness of breath.  Diagnosis of lung cancer. EXAM: CT ANGIOGRAPHY CHEST WITH CONTRAST TECHNIQUE: Multidetector CT imaging of the chest was performed using the standard protocol during bolus administration of intravenous contrast. Multiplanar CT image reconstructions and MIPs were obtained to evaluate the vascular anatomy. RADIATION DOSE REDUCTION: This exam was performed according to the departmental dose-optimization program which includes automated exposure control, adjustment of the mA and/or kV according to patient size and/or use of iterative reconstruction technique. CONTRAST:  75mL OMNIPAQUE  IOHEXOL  350 MG/ML SOLN COMPARISON:  CT chest 11/26/2023. FINDINGS: Cardiovascular: Satisfactory opacification of the pulmonary arteries to the segmental level. No evidence of pulmonary embolism. Normal heart size. No pericardial effusion. The aorta is ectatic. Patient is status post cardiac surgery. There are atherosclerotic calcifications of the aorta. Mediastinum/Nodes: Enlarged right hilar lymph node measures 15 mm. Enlarged left hilar  lymph node measures 11 mm. Visualized esophagus and thyroid  gland are within normal limits. Lungs/Pleura: Moderate emphysema is again seen. Pleural-parenchymal scarring in the lung apices appears stable. There is right lower lobe airspace consolidation which is new from prior. There is a small right pleural effusion similar to prior. There are additional scattered tree-in-bud opacities throughout the bilateral upper lobes and minimally in the left lower lobe. Spiculated nodular density in the left lower lobe measures 9 mm in appears unchanged from prior. Upper Abdomen: No acute abnormality. Musculoskeletal: Sternotomy wires are present. Review of the MIP images confirms the above findings. IMPRESSION: 1. No evidence for pulmonary embolism. 2. New right lower lobe airspace consolidation worrisome for pneumonia.  3. New bilateral hilar lymphadenopathy, likely reactive. 4. Stable small right pleural effusion. 5. Stable spiculated nodular density in the left lower lobe measuring 9 mm, indeterminate. Aortic Atherosclerosis (ICD10-I70.0) and Emphysema (ICD10-J43.9). Electronically Signed   By: Greig Pique M.D.   On: 12/18/2023 16:45   DG Chest Portable 1 View Result Date: 12/18/2023 CLINICAL DATA:  Shortness of breath.  History of emphysema. EXAM: PORTABLE CHEST 1 VIEW COMPARISON:  Numerous prior imaging studies. The most recent chest x-rays 11/29/2023 FINDINGS: Advanced emphysematous changes with marked hyperinflation bullous changes and pulmonary scarring. Superimposed patchy airspace opacity in the right mid and lower lungs is worrisome for superimposed pneumonia. Suspect small right pleural effusion also. No pneumothorax. No obvious pulmonary lesions. The bony thorax is intact. IMPRESSION: 1. Advanced emphysematous changes and pulmonary scarring. 2. Superimposed patchy airspace opacity in the right mid and lower lungs worrisome for superimposed pneumonia. Electronically Signed   By: MYRTIS Stammer M.D.   On:  12/18/2023 14:27   DG Chest Port 1 View Result Date: 11/29/2023 EXAM: 1 VIEW XRAY OF THE CHEST 11/29/2023 12:06:00 PM COMPARISON: 11/05/2023 CLINICAL HISTORY: S/P bronchoscopy. FINDINGS: LUNGS AND PLEURA: Mild right basilar opacity is noted concerning for pneumonia. Probable minimal left lingular subsegmental atelectasis or infiltrate. No pulmonary edema. No pleural effusion. No pneumothorax. HEART AND MEDIASTINUM: Status post coronary artery bypass graft. No acute abnormality of the cardiac and mediastinal silhouettes. BONES AND SOFT TISSUES: No acute osseous abnormality. IMPRESSION: 1. Right basilar opacity, suspicious for pneumonia. 2. Minimal left lingular subsegmental atelectasis versus infiltrate. Electronically signed by: Lynwood Seip MD 11/29/2023 12:35 PM EDT RP Workstation: [id number]   DG C-Arm 1-60 Min-No Report Result Date: 11/29/2023 Fluoroscopy was utilized by the requesting physician.  No radiographic interpretation.   DG C-Arm 1-60 Min-No Report Result Date: 11/29/2023 Fluoroscopy was utilized by the requesting physician.  No radiographic interpretation.   CT SUPER D CHEST WO MONARCH PILOT Result Date: 11/28/2023 CLINICAL DATA:  Left lung nodule * Tracking Code: BO * EXAM: CT CHEST WITHOUT CONTRAST TECHNIQUE: Multidetector CT imaging of the chest was performed using thin slice collimation for electromagnetic bronchoscopy planning purposes, without intravenous contrast. RADIATION DOSE REDUCTION: This exam was performed according to the departmental dose-optimization program which includes automated exposure control, adjustment of the mA and/or kV according to patient size and/or use of iterative reconstruction technique. COMPARISON:  11/07/2023 FINDINGS: Cardiovascular: Aortic atherosclerosis. Normal heart size. Extensive three-vessel coronary artery calcifications status post median sternotomy and CABG. No pericardial effusion. Mediastinum/Nodes: No enlarged mediastinal, hilar, or  axillary lymph nodes. Thyroid  gland, trachea, and esophagus demonstrate no significant findings. Lungs/Pleura: Severe emphysema and diffuse bilateral bronchial wall thickening. Improved bibasilar heterogeneous and consolidative airspace disease, which is now much more dense and organized in the right lung base and nearly resolved in the left lung base (series 4, image 128). Small right pleural effusion, new compared to prior examination. No significant change in a spiculated nodule of the peripheral left lower lobe measuring 1.0 x 0.6 cm (series 4, image 123). Upper Abdomen: No acute abnormality. Musculoskeletal: No chest wall abnormality. No acute osseous findings. IMPRESSION: 1. Improved bibasilar heterogeneous and consolidative airspace disease, which is now much more dense and organized in the right lung base and nearly resolved in the left lung base. Findings are consistent with improved infection or aspiration. 2. Small right pleural effusion, new compared to prior examination. 3. No significant change in a spiculated nodule of the peripheral left lower lobe measuring 1.0 x 0.6 cm. This  remains highly suspicious for primary lung malignancy. 4. Severe emphysema and diffuse bilateral bronchial wall thickening. 5. Coronary artery disease. Aortic Atherosclerosis (ICD10-I70.0) and Emphysema (ICD10-J43.9). Electronically Signed   By: Marolyn JONETTA Jaksch M.D.   On: 11/28/2023 13:48    Microbiology: Results for orders placed or performed during the hospital encounter of 12/18/23  Resp panel by RT-PCR (RSV, Flu A&B, Covid) Anterior Nasal Swab     Status: None   Collection Time: 12/18/23  1:54 PM   Specimen: Anterior Nasal Swab  Result Value Ref Range Status   SARS Coronavirus 2 by RT PCR NEGATIVE NEGATIVE Final    Comment: (NOTE) SARS-CoV-2 target nucleic acids are NOT DETECTED.  The SARS-CoV-2 RNA is generally detectable in upper respiratory specimens during the acute phase of infection. The  lowest concentration of SARS-CoV-2 viral copies this assay can detect is 138 copies/mL. A negative result does not preclude SARS-Cov-2 infection and should not be used as the sole basis for treatment or other patient management decisions. A negative result may occur with  improper specimen collection/handling, submission of specimen other than nasopharyngeal swab, presence of viral mutation(s) within the areas targeted by this assay, and inadequate number of viral copies(<138 copies/mL). A negative result must be combined with clinical observations, patient history, and epidemiological information. The expected result is Negative.  Fact Sheet for Patients:  bloggercourse.com  Fact Sheet for Healthcare Providers:  seriousbroker.it  This test is no t yet approved or cleared by the [place]  FDA and  has been authorized for detection and/or diagnosis of SARS-CoV-2 by FDA under an Emergency Use Authorization (EUA). This EUA will remain  in effect (meaning this test can be used) for the duration of the COVID-19 declaration under Section 564(b)(1) of the Act, 21 U.S.C.section 360bbb-3(b)(1), unless the authorization is terminated  or revoked sooner.       Influenza A by PCR NEGATIVE NEGATIVE Final   Influenza B by PCR NEGATIVE NEGATIVE Final    Comment: (NOTE) The Xpert Xpress SARS-CoV-2/FLU/RSV plus assay is intended as an aid in the diagnosis of influenza from Nasopharyngeal swab specimens and should not be used as a sole basis for treatment. Nasal washings and aspirates are unacceptable for Xpert Xpress SARS-CoV-2/FLU/RSV testing.  Fact Sheet for Patients: bloggercourse.com  Fact Sheet for Healthcare Providers: seriousbroker.it  This test is not yet approved or cleared by the [place]  FDA and has been authorized for detection and/or diagnosis of SARS-CoV-2 by FDA under  an Emergency Use Authorization (EUA). This EUA will remain in effect (meaning this test can be used) for the duration of the COVID-19 declaration under Section 564(b)(1) of the Act, 21 U.S.C. section 360bbb-3(b)(1), unless the authorization is terminated or revoked.     Resp Syncytial Virus by PCR NEGATIVE NEGATIVE Final    Comment: (NOTE) Fact Sheet for Patients: bloggercourse.com  Fact Sheet for Healthcare Providers: seriousbroker.it  This test is not yet approved or cleared by the [place]  FDA and has been authorized for detection and/or diagnosis of SARS-CoV-2 by FDA under an Emergency Use Authorization (EUA). This EUA will remain in effect (meaning this test can be used) for the duration of the COVID-19 declaration under Section 564(b)(1) of the Act, 21 U.S.C. section 360bbb-3(b)(1), unless the authorization is terminated or revoked.  Performed at [facility], [address] [place], [place] [address]   Blood Culture (routine x 2)     Status: None (Preliminary result)   Collection Time: 12/18/23  3:24 PM  Specimen: Right Antecubital; Blood  Result Value Ref Range Status   Specimen Description RIGHT ANTECUBITAL  Final   Special Requests   Final    BOTTLES DRAWN AEROBIC AND ANAEROBIC Blood Culture results may not be optimal due to an inadequate volume of blood received in culture bottles   Culture   Final    NO GROWTH 4 DAYS Performed at [facility], [address] [place], [place] [address]    Report Status PENDING  Incomplete  Blood Culture (routine x 2)     Status: None (Preliminary result)   Collection Time: 12/18/23  3:47 PM   Specimen: BLOOD RIGHT HAND  Result Value Ref Range Status   Specimen Description BLOOD RIGHT HAND  Final   Special Requests   Final    BOTTLES DRAWN AEROBIC AND ANAEROBIC Blood Culture results may not be optimal due to an inadequate volume of blood received in  culture bottles   Culture   Final    NO GROWTH 4 DAYS Performed at [facility], [address] [place], [place] [address]    Report Status PENDING  Incomplete    Labs: CBC: Recent Labs  Lab 12/18/23 1354 12/18/23 1959 12/19/23 0303 12/21/23 0546 12/22/23 0350  WBC 13.0*  --  19.7* 21.2* 22.1*  NEUTROABS 10.5*  --   --   --   --   HGB 6.4* 6.6* 8.6* 8.0* 8.0*  HCT 20.7* 20.1* 26.6* 24.7* 24.6*  MCV 89.6  --  84.7 85.5 85.1  PLT 259  --  312 355 404*   Basic Metabolic Panel: Recent Labs  Lab 12/18/23 1354 12/19/23 0303 12/21/23 0546 12/22/23 0350  NA 138 140 141 141  K 3.8 4.1 3.5 3.2*  CL 101 99 99 97*  CO2 30 31 35* 34*  GLUCOSE 164* 156* 118* 100*  BUN 17 16 28* 22  CREATININE 1.03 1.07 0.99 0.86  CALCIUM  9.1 9.1 9.4 9.0  MG 3.2*  --   --   --    Liver Function Tests: Recent Labs  Lab 12/18/23 1354  AST 35  ALT 28  ALKPHOS 119  BILITOT 0.4  PROT 6.5  ALBUMIN  3.4*   CBG: Recent Labs  Lab 12/19/23 1138 12/19/23 1600 12/20/23 0728 12/21/23 0748 12/22/23 0802  GLUCAP 233* 130* 131* 112* 103*    Discharge time spent: greater than 30 minutes.  Signed: Cresencio Fairly, MD [facility] 12/22/2023

## 2023-12-22 NOTE — TOC Transition Note (Signed)
 Transition of Care [facility]) - Discharge Note   Patient Details  Name: Todd Whitehead MRN: [id number] Date of Birth: [birth year]  Transition of Care [facility]) CM/SW Contact:  Victory Jackquline RAMAN, RN Phone Number: 12/22/2023, 5:18 PM   Clinical Narrative:   RNCM met with patient and son at bedside. RNCM introduced role and explained that discharge planning would be discussed. PT is recommending STR. Pt and son not in agreement with the recommendations and the patient said that his daughter was on the way to pick him up and that he lives with his daughter and his son-in-law. He has HH/PT/OT/RN/Aide with [facility] and Oxygen with [facility]. Patient has DME at home [facility]). Pt has discharge orders, no further concerns. RNCM signing off.   Final next level of care: Home w Home Health Services Barriers to Discharge: Barriers Resolved   Patient Goals and CMS Choice            Discharge Placement                Patient to be transferred to facility by: Daughter Name of family member notified: [name] Patient and family notified of of transfer: 12/22/23  Discharge Plan and Services Additional resources added to the After Visit Summary for       Post Acute Care Choice: NA                               Social Drivers of Health (SDOH) Interventions SDOH Screenings   Food Insecurity: No Food Insecurity (12/18/2023)  Housing: Low Risk  (12/18/2023)  Transportation Needs: No Transportation Needs (12/18/2023)  Utilities: Not At Risk (12/18/2023)  Depression (PHQ2-9): Low Risk  (12/17/2023)  Financial Resource Strain: Low Risk  (12/16/2023)   Received from [facility]  Social Connections: Moderately Integrated (12/18/2023)  Tobacco Use: High Risk (12/18/2023)     Readmission Risk Interventions    12/19/2023    2:02 PM  Readmission Risk Prevention Plan  Transportation Screening Complete  PCP or Specialist Appt within 3-5 Days Complete  Social Work  Consult for Recovery Care Planning/Counseling Complete  Palliative Care Screening Not Applicable  Medication Review Oceanographer) Referral to Pharmacy

## 2023-12-22 NOTE — Progress Notes (Signed)
 Speech Language Pathology Treatment: Dysphagia  Patient Details Name: Todd Whitehead MRN: [id number] DOB: 03/21/1945 Today's Date: 12/22/2023 Time: 1425-1445 SLP Time Calculation (min) (ACUTE ONLY): 20 min  Assessment / Plan / Recommendation Clinical Impression  Pt seen for ongoing toleration of diet; moreso, Education re: general precautions d/t pt Discharging Home today. Per MD secure chat, Family requested information from ST services re: pt's diet and swallowing precautions. Daughter is on her way to pick pt up. Son present in room.  Pt was talkative and answered general questions re: self adequately. Pt stated: I don't have any swallowing trouble.  Pt is on [place] O2 2L; afebrile. WBC elevated.  Pt and Son were explained general aspiration precautions; Handouts given. Pt acknowledged that he can become SOB w/ any exertion d/t the COPD. Highlighted that any SOB when eating/drinking can increase risk for aspiration -- thus, pt needed to take Rest Breaks during meals to lessen his risks. Encouraged pt to use small bites/sips slowly when eating/drinking. Pt agreed verbally to the need for following aspiration precautions especially sitting upright for all oral intake and taking his time when eating meals. Talked about food consistencies and encouraged Avoiding tougher meats/foods requiring increased chewing, which could increase WOB/SOB. Highlighted easy to eat foods and moistening foods well -- Soups.   Recommend continue his current Regular/mech soft(meats) diet w/ gravies added to moisten foods; Thin liquids- Cup drinking and less straw use d/t air swallowed/effort of inhalation. Recommend general aspiration precautions; Pills Whole in Puree if needed for ease/safety of swallowing; tray setup and positioning upright assistance for meals. Rest Breaks during meals. ST services can be available if any further needs while admitted. MD to reconsult. NSG present and updated.      HPI HPI: Todd Whitehead is a 78 y.o. year old male with medical history of hypertension, hyperlipidemia, type 2 diabetes, COPD, CHF, chronic hypoxic respiratory failure on 4 L, recent diagnosis of localized non-small cell lung cancer presented with generalized weakness and shortness of breath. Chest x-ray on 02/17/2023 revealed 1. Advanced emphysematous changes and pulmonary scarring. 2. Superimposed patchy airspace opacity in the right mid and lower lungs worrisome for superimposed pneumonia.      SLP Plan  Goals updated (pt is Discharging today)          Recommendations  Diet recommendations: Regular;Thin liquid Liquids provided via: Cup Medication Administration: Whole meds with liquid (vs Whole in Puree recommended) Supervision: Patient able to self feed;Intermittent supervision to cue for compensatory strategies Compensations: Slow rate;Minimize environmental distractions;Lingual sweep for clearance of pocketing;Small sips/bites;Follow solids with liquid (Rest Breaks during meals to conserve energy) Postural Changes and/or Swallow Maneuvers: Out of bed for meals;Seated upright 90 degrees;Upright 30-60 min after meal                 (Dietician f/u) Oral care BID;Patient independent with oral care (setup)   Intermittent Supervision/Assistance Dysphagia, oropharyngeal phase (R13.12) (COPD; deconditioned)     Goals updated (pt is Discharging today)       Todd Portugal, MS, CCC-SLP Speech Language Pathologist Rehab Services; [facility] [place] [phone] (ascom) Todd Whitehead  12/22/2023, 3:09 PM

## 2023-12-22 NOTE — Progress Notes (Signed)
 Mobility Specialist - Progress Note     12/22/23 1359  Mobility  Activity Ambulated with assistance;Stood at bedside;Pivoted/transferred from bed to chair  Level of Assistance Standby assist, set-up cues, supervision of patient - no hands on  Assistive Device Front wheel walker  Distance Ambulated (ft) 10 ft  Range of Motion/Exercises Active  Activity Response Tolerated well  Mobility Referral Yes  Mobility visit 1 Mobility   Pt resting in bed on 2L upon entry. Pt STS and ambulates in room to recliner SBA with RW. Pt left in recliner with needs in reach bedding and gown changed. Chair alarm activated.   Guido Rumble Mobility Specialist 12/22/23, 3:27 PM

## 2023-12-23 LAB — CULTURE, BLOOD (ROUTINE X 2)
Culture: NO GROWTH
Culture: NO GROWTH

## 2023-12-30 ENCOUNTER — Other Ambulatory Visit: Payer: Self-pay | Admitting: Pharmacist

## 2023-12-30 ENCOUNTER — Ambulatory Visit
Admission: RE | Admit: 2023-12-30 | Discharge: 2023-12-30 | Disposition: A | Source: Ambulatory Visit | Attending: Radiation Oncology | Admitting: Radiation Oncology

## 2023-12-30 DIAGNOSIS — C3490 Malignant neoplasm of unspecified part of unspecified bronchus or lung: Secondary | ICD-10-CM

## 2023-12-30 NOTE — Progress Notes (Signed)
 Pharmacy - lab order  [facility] wants mold identified from 10/31 BAL culture.  Microbiology instructed them to call me to get order placed.  A order has been placed to send specimen to [facility] for identification.  Microbiology lab at [facility] notified.    Tyneshia Stivers, PharmD, BCPS, BCIDP Work Cell: [phone] 12/30/2023 9:25 AM

## 2024-01-01 ENCOUNTER — Encounter
Admission: RE | Admit: 2024-01-01 | Discharge: 2024-01-01 | Attending: Radiation Oncology | Admitting: Radiation Oncology

## 2024-01-01 DIAGNOSIS — C3490 Malignant neoplasm of unspecified part of unspecified bronchus or lung: Secondary | ICD-10-CM | POA: Diagnosis present

## 2024-01-01 LAB — GLUCOSE, CAPILLARY: Glucose-Capillary: 101 mg/dL — ABNORMAL HIGH (ref 70–99)

## 2024-01-01 MED ORDER — FLUDEOXYGLUCOSE F - 18 (FDG) INJECTION
5.8000 | Freq: Once | INTRAVENOUS | Status: AC | PRN
  Administered 2024-01-01: 6.09 via INTRAVENOUS

## 2024-01-02 ENCOUNTER — Ambulatory Visit

## 2024-01-07 ENCOUNTER — Ambulatory Visit
Admission: RE | Admit: 2024-01-07 | Discharge: 2024-01-07 | Attending: Radiation Oncology | Admitting: Radiation Oncology

## 2024-01-07 DIAGNOSIS — C3431 Malignant neoplasm of lower lobe, right bronchus or lung: Secondary | ICD-10-CM | POA: Insufficient documentation

## 2024-01-07 DIAGNOSIS — F17210 Nicotine dependence, cigarettes, uncomplicated: Secondary | ICD-10-CM | POA: Insufficient documentation

## 2024-01-07 DIAGNOSIS — Z510 Encounter for antineoplastic radiation therapy: Secondary | ICD-10-CM | POA: Insufficient documentation

## 2024-01-13 ENCOUNTER — Ambulatory Visit

## 2024-01-13 DIAGNOSIS — Z510 Encounter for antineoplastic radiation therapy: Secondary | ICD-10-CM | POA: Diagnosis not present

## 2024-01-13 LAB — ACID FAST CULTURE WITH REFLEXED SENSITIVITIES (MYCOBACTERIA): Acid Fast Culture: NEGATIVE

## 2024-01-15 ENCOUNTER — Ambulatory Visit

## 2024-01-15 ENCOUNTER — Other Ambulatory Visit: Payer: Self-pay

## 2024-01-15 ENCOUNTER — Ambulatory Visit
Admission: RE | Admit: 2024-01-15 | Discharge: 2024-01-15 | Attending: Radiation Oncology | Admitting: Radiation Oncology

## 2024-01-15 DIAGNOSIS — Z510 Encounter for antineoplastic radiation therapy: Secondary | ICD-10-CM | POA: Diagnosis not present

## 2024-01-15 DIAGNOSIS — C3432 Malignant neoplasm of lower lobe, left bronchus or lung: Secondary | ICD-10-CM

## 2024-01-15 LAB — RAD ONC ARIA SESSION SUMMARY
Course Elapsed Days: 0
Plan Fractions Treated to Date: 1
Plan Prescribed Dose Per Fraction: 11 Gy
Plan Total Fractions Prescribed: 5
Plan Total Prescribed Dose: 55 Gy
Reference Point Dosage Given to Date: 11 Gy
Reference Point Session Dosage Given: 11 Gy
Session Number: 1

## 2024-01-15 NOTE — Progress Notes (Signed)
 Radiation Oncology Follow up Note  Name: Todd Whitehead   Date:   01/15/2024 MRN:  [id number] DOB: 1945/08/23    This 79 y.o. male presents to the clinic today for initiation of SBRT.  To a left lower lobe lesion spiculated nodule progressing highly suspicious for primary lung carcinoma  REFERRING PROVIDER: Lenon Layman ORN, MD  HPI: Patient is a 78 year old male.  With significant history of COPD CABG x 2.  He was referred to me by Dr. Parris after developing airspace opacity throughout the right lung base.  He also had a spiculated nodule in the periphery of the left lower lobe which had been progressing and consistent with non-small cell lung cancer.  He underwent bronchoscopy with right lower lobe showing rare atypical cells suspicious but not diagnostic of non-small cell cancer.  Prior to simulation I ran a PET CT scan showing hypermetabolic consolidation in the right lower lobe with a small right pleural effusion.  Findings were suspicious for infection or inflammatory part process.  The left lower lobe pulm nodule measured 0.9 x 0.5 cm had SUV of only 0.5 although this was borderline level of sensitivity for PET/CT characterization.  He did have mild hypermetabolic small right hilar and precarinal lymph nodes indeterminate possibly reactive.  Based on the PET CT scan findings my initial consultation was concerned with the right lung although by PET/CT criteria this could have all been infection and I decided to treat the left lower lobe nodule which is highly suspicious for primary bronchogenic carcinoma.  COMPLICATIONS OF TREATMENT: none  FOLLOW UP COMPLIANCE: keeps appointments   PHYSICAL EXAM:  There were no vitals taken for this visit. Well-developed well-nourished patient in NAD. HEENT reveals PERLA, EOMI, discs not visualized.  Oral cavity is clear. No oral mucosal lesions are identified. Neck is clear without evidence of cervical or supraclavicular adenopathy. Lungs are clear  to A&P. Cardiac examination is essentially unremarkable with regular rate and rhythm without murmur rub or thrill. Abdomen is benign with no organomegaly or masses noted. Motor sensory and DTR levels are equal and symmetric in the upper and lower extremities. Cranial nerves II through XII are grossly intact. Proprioception is intact. No peripheral adenopathy or edema is identified. No motor or sensory levels are noted. Crude visual fields are within normal range.  RADIOLOGY RESULTS: Again all scans PET scan CT scans were reviewed prior to simulation with the determination of targeting his left lower lobe spiculated nodule  PLAN: Plan was altered from right sided treatment to left lower lobe treatment based on the above findings and my recommendations.  Patient comprehends my recommendations well attack and has consented to treatment.  We will clinically follow the right sided lung with serial CT scan surveillance over the next several months.    Marcey Penton, MD

## 2024-01-16 ENCOUNTER — Ambulatory Visit

## 2024-01-20 ENCOUNTER — Other Ambulatory Visit: Payer: Self-pay

## 2024-01-20 ENCOUNTER — Ambulatory Visit

## 2024-01-20 ENCOUNTER — Ambulatory Visit
Admission: RE | Admit: 2024-01-20 | Discharge: 2024-01-20 | Attending: Radiation Oncology | Admitting: Radiation Oncology

## 2024-01-20 DIAGNOSIS — Z510 Encounter for antineoplastic radiation therapy: Secondary | ICD-10-CM | POA: Diagnosis not present

## 2024-01-20 LAB — RAD ONC ARIA SESSION SUMMARY
Course Elapsed Days: 5
Plan Fractions Treated to Date: 2
Plan Prescribed Dose Per Fraction: 11 Gy
Plan Total Fractions Prescribed: 5
Plan Total Prescribed Dose: 55 Gy
Reference Point Dosage Given to Date: 22 Gy
Reference Point Session Dosage Given: 11 Gy
Session Number: 2

## 2024-01-22 ENCOUNTER — Other Ambulatory Visit: Payer: Self-pay

## 2024-01-22 DIAGNOSIS — Z510 Encounter for antineoplastic radiation therapy: Secondary | ICD-10-CM | POA: Diagnosis not present

## 2024-01-22 LAB — RAD ONC ARIA SESSION SUMMARY
Course Elapsed Days: 7
Plan Fractions Treated to Date: 3
Plan Prescribed Dose Per Fraction: 11 Gy
Plan Total Fractions Prescribed: 5
Plan Total Prescribed Dose: 55 Gy
Reference Point Dosage Given to Date: 33 Gy
Reference Point Session Dosage Given: 11 Gy
Session Number: 3

## 2024-01-27 ENCOUNTER — Ambulatory Visit
Admission: RE | Admit: 2024-01-27 | Discharge: 2024-01-27 | Disposition: A | Discharge status: Home / Self Care | Source: Ambulatory Visit | Attending: Radiation Oncology | Admitting: Radiation Oncology

## 2024-01-27 ENCOUNTER — Other Ambulatory Visit: Payer: Self-pay

## 2024-01-27 DIAGNOSIS — Z510 Encounter for antineoplastic radiation therapy: Secondary | ICD-10-CM | POA: Diagnosis not present

## 2024-01-27 LAB — RAD ONC ARIA SESSION SUMMARY
Course Elapsed Days: 12
Plan Fractions Treated to Date: 4
Plan Prescribed Dose Per Fraction: 11 Gy
Plan Total Fractions Prescribed: 5
Plan Total Prescribed Dose: 55 Gy
Reference Point Dosage Given to Date: 44 Gy
Reference Point Session Dosage Given: 11 Gy
Session Number: 4

## 2024-01-29 ENCOUNTER — Other Ambulatory Visit: Payer: Self-pay

## 2024-01-29 ENCOUNTER — Ambulatory Visit
Admission: RE | Admit: 2024-01-29 | Discharge: 2024-01-29 | Disposition: A | Discharge status: Home / Self Care | Source: Ambulatory Visit | Attending: Radiation Oncology | Admitting: Radiation Oncology

## 2024-01-29 DIAGNOSIS — Z510 Encounter for antineoplastic radiation therapy: Secondary | ICD-10-CM | POA: Diagnosis not present

## 2024-01-29 LAB — RAD ONC ARIA SESSION SUMMARY
Course Elapsed Days: 14
Plan Fractions Treated to Date: 5
Plan Prescribed Dose Per Fraction: 11 Gy
Plan Total Fractions Prescribed: 5
Plan Total Prescribed Dose: 55 Gy
Reference Point Dosage Given to Date: 55 Gy
Reference Point Session Dosage Given: 11 Gy
Session Number: 5

## 2024-01-30 NOTE — Radiation Completion Notes (Signed)
 Patient Name: Todd Whitehead, Todd Whitehead MRN: [id number] Date of Birth: 04/12/1945 Referring Physician: LAYMAN PIETY, M.D. Date of Service: 2024-01-30 Radiation Oncologist: Marcey Penton, M.D. [facility] [place]                             RADIATION ONCOLOGY END OF TREATMENT NOTE     Diagnosis: R91.1 Solitary pulmonary nodule Intent: Curative     HPI: Patient is a 79 year old male.  With significant history of COPD CABG x 2.  He was referred to me by Dr. Parris after developing airspace opacity throughout the right lung base.  He also had a spiculated nodule in the periphery of the left lower lobe which had been progressing and consistent with non-small cell lung cancer.  He underwent bronchoscopy with right lower lobe showing rare atypical cells suspicious but not diagnostic of non-small cell cancer.  Prior to simulation I ran a PET CT scan showing hypermetabolic consolidation in the right lower lobe with a small right pleural effusion.  Findings were suspicious for infection or inflammatory part process.  The left lower lobe pulm nodule measured 0.9 x 0.5 cm had SUV of only 0.5 although this was borderline level of sensitivity for PET/CT characterization.  He did have mild hypermetabolic small right hilar and precarinal lymph nodes indeterminate possibly reactive.  Based on the PET CT scan findings my initial consultation was concerned with the right lung although by PET/CT criteria this could have all been infection and I decided to treat the left lower lobe nodule which is highly suspicious for primary bronchogenic carcinoma.      ==========DELIVERED PLANS==========  First Treatment Date: 2024-01-15 Last Treatment Date: 2024-01-29   Plan Name: Lung_L_SBRT Site: Lung, Left Technique: SBRT/SRT-IMRT Mode: Photon Dose Per Fraction: 11 Gy Prescribed Dose (Delivered / Prescribed): 55 Gy / 55 Gy Prescribed Fxs (Delivered / Prescribed): 5 / 5     ==========ON TREATMENT VISIT  DATES========== 2024-01-15, 2024-01-20, 2024-01-22, 2024-01-27, 2024-01-27, 2024-01-29     ==========UPCOMING VISITS========== 03/30/2024 [facility] EST CHF [address], [place]  03/25/2024 [facility] ON[facility] EST PT Babara Call, MD  03/25/2024 [facility] ON[facility] LAB [facility]  03/04/2024 [facility] RAD ONCOLOGY FOLLOW UP 30 Chrystal, Marcey, MD        ==========APPENDIX - ON TREATMENT VISIT NOTES==========   See weekly On Treatment Notes in Epic for details in the Media tab (listed as Progress notes on the On Treatment Visit Dates listed above).

## 2024-03-04 ENCOUNTER — Other Ambulatory Visit: Payer: Self-pay | Admitting: *Deleted

## 2024-03-04 ENCOUNTER — Ambulatory Visit
Admission: RE | Admit: 2024-03-04 | Discharge: 2024-03-04 | Disposition: A | Source: Ambulatory Visit | Attending: Radiation Oncology | Admitting: Radiation Oncology

## 2024-03-04 ENCOUNTER — Encounter: Payer: Self-pay | Admitting: Radiation Oncology

## 2024-03-04 VITALS — BP 135/78 | HR 77 | Temp 98.4°F | Resp 20

## 2024-03-04 DIAGNOSIS — R911 Solitary pulmonary nodule: Secondary | ICD-10-CM

## 2024-03-04 NOTE — Progress Notes (Signed)
 Radiation Oncology Follow up Note  Name: Todd Whitehead   Date:   03/04/2024 1 month follow-up status post MRN:  [id number] DOB: 1945-08-31    This 79 y.o. male presents to the clinic today for SBRT to a left lower lobe lesion spiculated highly suggestive of primary lung cancer non-small cell.  REFERRING PROVIDER: Lenon Layman ORN, MD  HPI: Patient is a 79 year old male now out 1 month of a completed SBRT to his left lower lobe spiculated lesion in consistent with non-small cell lung cancer seen today in routine follow-up he is doing well specifically his cough hemoptysis chest tightness or any change in his pulmonary status..  COMPLICATIONS OF TREATMENT: none  FOLLOW UP COMPLIANCE: keeps appointments   PHYSICAL EXAM:  BP 135/78   Pulse 77   Temp 98.4 F (36.9 C) (Tympanic)   Resp 20  Well-developed well-nourished patient in NAD. HEENT reveals PERLA, EOMI, discs not visualized.  Oral cavity is clear. No oral mucosal lesions are identified. Neck is clear without evidence of cervical or supraclavicular adenopathy. Lungs are clear to A&P. Cardiac examination is essentially unremarkable with regular rate and rhythm without murmur rub or thrill. Abdomen is benign with no organomegaly or masses noted. Motor sensory and DTR levels are equal and symmetric in the upper and lower extremities. Cranial nerves II through XII are grossly intact. Proprioception is intact. No peripheral adenopathy or edema is identified. No motor or sensory levels are noted. Crude visual fields are within normal range.  RADIOLOGY RESULTS: CT scan of the chest ordered in 3 months  PLAN: Present time patient is clinically doing well with extremely low side effect profile from his SBRT.  I have asked to see him back in 3 months with a CT scan of his chest prior to that visit.  Patient comprehends my recommendations well.  He knows to call sooner with any concerns.  I would like to take this opportunity to thank you for  allowing me to participate in the care of your patient.SABRA Marcey Penton, MD

## 2024-03-25 ENCOUNTER — Ambulatory Visit: Admitting: Oncology

## 2024-03-25 ENCOUNTER — Other Ambulatory Visit

## 2024-03-30 ENCOUNTER — Encounter: Admitting: Family

## 2024-05-18 ENCOUNTER — Other Ambulatory Visit

## 2024-06-01 ENCOUNTER — Ambulatory Visit: Admitting: Radiation Oncology
